# Patient Record
Sex: Male | Born: 1970
Health system: Southern US, Community
[De-identification: ages and names within clinical notes are randomized; demographics above are authoritative.]

## PROBLEM LIST (undated history)

## (undated) DIAGNOSIS — I890 Lymphedema, not elsewhere classified: Secondary | ICD-10-CM

## (undated) DIAGNOSIS — L03116 Cellulitis of left lower limb: Secondary | ICD-10-CM

## (undated) DIAGNOSIS — I10 Essential (primary) hypertension: Secondary | ICD-10-CM

## (undated) DIAGNOSIS — M1990 Unspecified osteoarthritis, unspecified site: Secondary | ICD-10-CM

## (undated) DIAGNOSIS — G4730 Sleep apnea, unspecified: Secondary | ICD-10-CM

## (undated) DIAGNOSIS — Z993 Dependence on wheelchair: Secondary | ICD-10-CM

## (undated) DIAGNOSIS — G9589 Other specified diseases of spinal cord: Secondary | ICD-10-CM

## (undated) DIAGNOSIS — R7303 Prediabetes: Secondary | ICD-10-CM

## (undated) DIAGNOSIS — L03119 Cellulitis of unspecified part of limb: Secondary | ICD-10-CM

## (undated) DIAGNOSIS — R600 Localized edema: Secondary | ICD-10-CM

## (undated) DIAGNOSIS — IMO0002 Reserved for concepts with insufficient information to code with codable children: Secondary | ICD-10-CM

## (undated) DIAGNOSIS — K219 Gastro-esophageal reflux disease without esophagitis: Secondary | ICD-10-CM

## (undated) DIAGNOSIS — J45909 Unspecified asthma, uncomplicated: Secondary | ICD-10-CM

## (undated) DIAGNOSIS — L02416 Cutaneous abscess of left lower limb: Secondary | ICD-10-CM

## (undated) DIAGNOSIS — I509 Heart failure, unspecified: Secondary | ICD-10-CM

## (undated) DIAGNOSIS — T7840XA Allergy, unspecified, initial encounter: Secondary | ICD-10-CM

## (undated) DIAGNOSIS — E785 Hyperlipidemia, unspecified: Secondary | ICD-10-CM

## (undated) HISTORY — PX: BACK SURGERY: SHX140

## (undated) HISTORY — DX: Localized edema: R60.0

## (undated) HISTORY — DX: Sleep apnea, unspecified: G47.30

## (undated) HISTORY — DX: Allergy, unspecified, initial encounter: T78.40XA

## (undated) HISTORY — DX: Hyperlipidemia, unspecified: E78.5

## (undated) HISTORY — DX: Unspecified asthma, uncomplicated: J45.909

## (undated) HISTORY — PX: JOINT REPLACEMENT: SHX530

## (undated) HISTORY — DX: Prediabetes: R73.03

## (undated) HISTORY — DX: Reserved for concepts with insufficient information to code with codable children: IMO0002

## (undated) HISTORY — DX: Unspecified osteoarthritis, unspecified site: M19.90

---

## 1991-01-07 DIAGNOSIS — IMO0002 Reserved for concepts with insufficient information to code with codable children: Secondary | ICD-10-CM

## 1991-01-07 HISTORY — PX: SPINAL FUSION: SHX223

## 1991-01-07 HISTORY — DX: Reserved for concepts with insufficient information to code with codable children: IMO0002

## 2000-12-28 ENCOUNTER — Ambulatory Visit (HOSPITAL_COMMUNITY): Admission: RE | Admit: 2000-12-28 | Discharge: 2000-12-28 | Payer: Self-pay | Admitting: Preventative Medicine

## 2000-12-28 ENCOUNTER — Encounter: Payer: Self-pay | Admitting: Preventative Medicine

## 2001-02-17 ENCOUNTER — Emergency Department (HOSPITAL_COMMUNITY): Admission: EM | Admit: 2001-02-17 | Discharge: 2001-02-17 | Payer: Self-pay | Admitting: *Deleted

## 2001-02-17 ENCOUNTER — Encounter: Payer: Self-pay | Admitting: *Deleted

## 2002-04-22 ENCOUNTER — Encounter: Payer: Self-pay | Admitting: *Deleted

## 2002-04-22 ENCOUNTER — Emergency Department (HOSPITAL_COMMUNITY): Admission: EM | Admit: 2002-04-22 | Discharge: 2002-04-22 | Payer: Self-pay | Admitting: *Deleted

## 2003-07-18 ENCOUNTER — Emergency Department (HOSPITAL_COMMUNITY): Admission: EM | Admit: 2003-07-18 | Discharge: 2003-07-18 | Payer: Self-pay | Admitting: Emergency Medicine

## 2003-07-25 ENCOUNTER — Emergency Department (HOSPITAL_COMMUNITY): Admission: EM | Admit: 2003-07-25 | Discharge: 2003-07-25 | Payer: Self-pay | Admitting: Emergency Medicine

## 2003-08-22 ENCOUNTER — Encounter (HOSPITAL_COMMUNITY): Admission: RE | Admit: 2003-08-22 | Discharge: 2003-09-21 | Payer: Self-pay | Admitting: Orthopaedic Surgery

## 2004-12-23 ENCOUNTER — Emergency Department (HOSPITAL_COMMUNITY): Admission: EM | Admit: 2004-12-23 | Discharge: 2004-12-24 | Payer: Self-pay | Admitting: Emergency Medicine

## 2004-12-24 ENCOUNTER — Ambulatory Visit (HOSPITAL_COMMUNITY): Admission: RE | Admit: 2004-12-24 | Discharge: 2004-12-24 | Payer: Self-pay | Admitting: Emergency Medicine

## 2005-12-05 ENCOUNTER — Encounter: Payer: Self-pay | Admitting: Family Medicine

## 2006-05-19 ENCOUNTER — Emergency Department (HOSPITAL_COMMUNITY): Admission: EM | Admit: 2006-05-19 | Discharge: 2006-05-19 | Payer: Self-pay | Admitting: Emergency Medicine

## 2006-07-29 ENCOUNTER — Encounter: Admission: RE | Admit: 2006-07-29 | Discharge: 2006-09-07 | Payer: Self-pay | Admitting: Family Medicine

## 2006-09-08 ENCOUNTER — Encounter: Admission: RE | Admit: 2006-09-08 | Discharge: 2006-10-14 | Payer: Self-pay | Admitting: Family Medicine

## 2006-10-08 ENCOUNTER — Encounter: Admission: RE | Admit: 2006-10-08 | Discharge: 2006-10-09 | Payer: Self-pay | Admitting: *Deleted

## 2006-10-22 ENCOUNTER — Inpatient Hospital Stay (HOSPITAL_COMMUNITY): Admission: EM | Admit: 2006-10-22 | Discharge: 2006-10-24 | Payer: Self-pay | Admitting: Emergency Medicine

## 2007-01-02 ENCOUNTER — Inpatient Hospital Stay (HOSPITAL_COMMUNITY): Admission: EM | Admit: 2007-01-02 | Discharge: 2007-01-06 | Payer: Self-pay | Admitting: Emergency Medicine

## 2008-01-07 DIAGNOSIS — R600 Localized edema: Secondary | ICD-10-CM

## 2008-01-07 HISTORY — DX: Localized edema: R60.0

## 2009-12-30 ENCOUNTER — Emergency Department (HOSPITAL_COMMUNITY)
Admission: EM | Admit: 2009-12-30 | Discharge: 2009-12-31 | Payer: Self-pay | Source: Home / Self Care | Admitting: Emergency Medicine

## 2010-03-18 LAB — URINALYSIS, ROUTINE W REFLEX MICROSCOPIC
Protein, ur: 100 mg/dL — AB
Urobilinogen, UA: 0.2 mg/dL (ref 0.0–1.0)

## 2010-03-18 LAB — URINE MICROSCOPIC-ADD ON

## 2010-03-18 LAB — URINE CULTURE

## 2010-05-21 NOTE — Group Therapy Note (Signed)
NAMEJAMESROBERT, Jordan Jensen                 ACCOUNT NO.:  [id number]   MEDICAL RECORD NO.:  [id number]          PATIENT TYPE:  [facility]   LOCATION:  A318                          FACILITY:  [facility]   PHYSICIAN:  Skeet Latch, DO    DATE OF BIRTH:  [birth year]   DATE OF PROCEDURE:  01/05/2007  DATE OF DISCHARGE:                                 PROGRESS NOTE   SUBJECTIVE:  Jordan Jensen is a 40 year old African-American male with a  history of lower left leg lymphedema, which he went to [facility] to  their lymphedema clinic.  The patient came in with increased pain,  swelling, and warmness of left lower leg.  After, he states he went on a  long drive to pick up his niece from the school.  The patient denied any  new trauma to the region.  He states for at least 3 to 4 days prior, his  leg was very painful and warm and had increase in swelling.  The patient  admits that he was treating a wound on his left foot that he believes  may have got infected and started this process with his left lower leg.  The patient was admitted and started on IV antibiotics.  He seems to be  improving, but his leg is still warm to the touch and swollen.  The  patient was also found to have a urinary tract infection upon being  admitted to the hospital.  The patient was seen with his same complaint  and he was in the hospital a couple months ago and was treated with  antibiotics for probably 4 to 5 days and discharged.  Today, he states  his pain feels better and he is doing well.   OBJECTIVE:  VITAL SIGNS:  Temperature is 98.8, respirations 20, heart  rate 75, blood pressure 114/72.  CARDIOVASCULAR:  Regular rate and rhythm. No murmurs, rubs, or gallops.  LUNGS:  Clear to auscultation bilaterally.  No rales, rhonchi, or  wheezing.  ABDOMEN:  Obese, soft, nontender, nondistended.  No rigidity or  guarding.  Positive bowel sounds.  EXTREMITIES:  Right lower extremity with slight edema.  No redness or  major swelling is  noted.  The left lower extremity has significant  lymphedema.  It is warm to the touch.  It seems to be improved from  yesterday.  Does have induration, but seems to be improving slowly.   LABS:  Hemoglobin 1.8, hematocrit 34.9, white count 7.6, platelets 167,  sodium 140, potassium 3.9, chloride 106, GOT is 29, BUN 4, creatinine  0.97.  His glucose is 95.   ASSESSMENT AND PLAN:  1. Left leg cellulitis with lymphedema.  We will continue with      intravenous antibiotics at this time and continue to watch his      blood cultures, keep his leg elevated.  We will add diuretic to his      regimen at this time.  2. Urinary tract infection.  Continue with IV antibiotics.  3. Chronic lymphedema.  The patient may need referral to maybe a local  lymphedema clinic maybe at [facility] upon being discharged.      Skeet Latch, DO  Electronically Signed    SM/MEDQ  D:  01/05/2007  T:  01/06/2007  Job:  [id number]
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
## 2010-05-21 NOTE — Discharge Summary (Signed)
[name], Jordan Jensen                 ACCOUNT NO.:  [id number]   MEDICAL RECORD NO.:  [id number]          PATIENT TYPE:  [facility]   LOCATION:  A331                          FACILITY:  [facility]   PHYSICIAN:  Dorris Singh, DO    DATE OF BIRTH:  11/15/1970   DATE OF ADMISSION:  10/21/2006  DATE OF DISCHARGE:  10/18/2008LH                               DISCHARGE SUMMARY   ADMISSION DIAGNOSES:  1. Cellulitis.  2. Chronic lymphedema of left lower extremity.  3. Leukocytosis.   DISCHARGE DIAGNOSES:  1. Left lower leg cellulitis.  2. Chronic lymphedema bilaterally.  3. Thrombocytopenia.   PRIMARY CARE PHYSICIAN:  [facility].   HISTORY AND PHYSICAL:  The patient is a 40 year old African-American  male who presents with back pain and increasing swelling of left leg.  He has a history of a C6-7 cord injury accident and walks with crutches.  He has been treated for lymphedema for the past 6 months for his left  extremity, and states that it began to get very swollen and edematous  and painful, and he has been treated for lymphedema of that very same  leg for approximately 20 outpatient treatments, but states it is bigger,  and his leg has been warm to the touch.  He was then admitted to the  service of [facility], and he was placed on IV antibiotics, and blood and  urine cultures were obtained.  He was also placed on unison empirically,  and a wound care nurse was consulted. His lymphedema was chronic in  nature and is probably contributing to his cellulitis.  A venous Doppler  was obtained which did not find any thrombophlebitis or a deep vein  thrombosis as well.  The patient continued to do well with a decrease in  swelling on October 17th.  It was determined that if the patient  continued to progress well, that he could be discharged after one  treatment on October 18th, and then could be switched to p.o.  medications.  He continued to do well, had no complaints, was  afebrile.  At that point in time, on October 18th, it was determined he could be  discharged to home.  The patient's condition is stable.   DISPOSITION:  To home.   He is to follow up with [facility] Practitioners in 3-5 days to have  them examine his leg to make sure that the swelling continues to improve  and function begins to improve.  The patient stated understanding of  this.  He will be sent home on Levaquin 500 mg 1 p.o. q. day x10 days.      Dorris Singh, DO  Electronically Signed     CB/MEDQ  D:  10/24/2006  T:  10/26/2006  Job:  [id number]

## 2010-05-21 NOTE — Discharge Summary (Signed)
Jordan Jensen, KE                 ACCOUNT NO.:  [id number]   MEDICAL RECORD NO.:  [id number]          PATIENT TYPE:  [facility]   LOCATION:  A318                          FACILITY:  [facility]   PHYSICIAN:  Osvaldo Shipper, MD     DATE OF BIRTH:  10/16/1970   DATE OF ADMISSION:  01/02/2007  DATE OF DISCHARGE:  12/31/2008LH                               DISCHARGE SUMMARY   PRIMARY MEDICAL DOCTOR:  Dr. Jorene Guest at [facility].   DISCHARGE DIAGNOSES:  1. Left lower extremity cellulitis.  2. Chronic lower extremity lymphedema, probably predisposing to #1.  3. Urinary tract infection, stable.   Please see the H and P, dictated by Dr. Lilian Kapur for details regarding  the patient's presenting illness.   BRIEF HOSPITAL COURSE:  Patient is a 40 year old African-American male,  who is obese, who has chronic lymphedema of the lower extremities, who  presented with complaints of leg pain.  The patient was found to have  cellulitis.  Patient has been previously admitted for similar  complaints.  He has undergone two Doppler studies, both of which have  been negative for DVT.  Basically, the patient has chronic lymphedema of  the lower extremities, which predisposes him to cellulitis.  His white  count was 14,000 when he came in.  Patient was put on Unasyn and he has  significantly improved.  He was also put on low-dose Lasix, which seems  to have helped the swelling and his erythema.  Today, patient is feeling  well.  His erythema is still present.  He still has some warmth, but  according to the patient, both of these features have improved in his  left leg.  He has been afebrile, his vital signs have been stable.  His  white count is normal, so I think he can be considered stable for  discharge.   Dr. Lilian Kapur did feel that patient may be a candidate for referral to a  tertiary care center, such as [facility] or [facility], for evaluation and  treatment of his chronic lymphedema.  I think this  may be a good idea  and his PMD should probably try to refer him to one of these centers.   There was also doubt regarding UTI, but cultures did not show any  significant organisms.   DISCHARGE MEDICATIONS:  1. Augmentin 875/125 one tab b.i.d. for two weeks.  2. Lasix 20 mg daily for 7 days.  3. Potassium chloride 10 mEq once daily for 7 days.   FOLLOWUP:  With [facility] in two weeks.   DIET:  As before.   PHYSICAL ACTIVITY:  As before.   Total time of discharge:  35 minutes.      Osvaldo Shipper, MD  Electronically Signed     GK/MEDQ  D:  01/06/2007  T:  01/06/2007  Job:  [id number]

## 2010-05-21 NOTE — Group Therapy Note (Signed)
Jordan Jensen, Jordan Jensen                 ACCOUNT NO.:  [id number]   MEDICAL RECORD NO.:  [id number]          PATIENT TYPE:  [facility]   LOCATION:  A331                          FACILITY:  [facility]   PHYSICIAN:  Skeet Latch, DO    DATE OF BIRTH:  12-07-1970   DATE OF PROCEDURE:  10/23/2006  DATE OF DISCHARGE:                                 PROGRESS NOTE   SUBJECTIVE:  Jordan Jensen seems to slowly be improving.  He states his left  lower extremity has decreased pain, swelling, and tenderness today.  The  patient was seen by the wound care nurse and was given instructions  regarding care of his left lower extremity .  The patient seems to be  doing well at this time.   OBJECTIVE:  VITAL SIGNS:  Today, temperature is 100.6, pulse 83,  respirations 20, blood pressure is 111/56.  CARDIOVASCULAR:  Regular rate and rhythm, no rubs, gallops, or murmurs.  LUNGS:  Clear to auscultation bilaterally.  No rales, rhonchi, or  wheezing.  ABDOMEN:  Obese, soft, nontender, nondistended.  No rigidity or  guarding.  Positive bowel sounds.  EXTREMITIES:  Left lower extremity fluctuance is still present, still  has warmth to his leg.  The swelling seems to be slightly decreased as  also the erythema seems to be decreased at this time.  No obvious open  wounds are noted on his extremity.  The patient does have a scab on the  upper foot of his left leg and there continue to be chronic change to  his left lower extremity.   LABORATORY:  Today, lipid panel with cholesterol 149 and triglycerides  45, ACS 32, LDL 108.  Blood cultures, so far, are negative.  Sodium 139,  potassium 3.9, chloride 105, CO2 is 29, glucose 107, BUN 7, creatinine  1.32.  White count 10.6, hemoglobin 13, hematocrit 38.2, platelets 149.   ASSESSMENT/PLAN:  1. __________ cellulitis.  The patient continues to be on intravenous      antibiotics and so far his blood cultures are negative.  I will      continue the patient on intravenous Unasyn at  this time.  And we      will continue wound care instructions to his left lower extremity.  2. Peripheral lymph edema seems to be chronic, seems to be fluctuating      in nature and at this time very difficult to assess secondary to      the cellulitis of his leg.  His venous dopplers were negative for      deep vein thrombosis at this time.  The patient will continue deep      vein thrombosis and gastrointestinal prophylaxis.  3. Leukocytosis, seemingly resolved.  We will continue to monitor his      white count.  4. Pyrexia.  We are continuing intravenous antibiotics as well as      Tylenol for any recurring fevers.      Skeet Latch, DO  Electronically Signed     SM/MEDQ  D:  10/23/2006  T:  10/23/2006  Job:  [id number]
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
## 2010-10-11 LAB — URINE MICROSCOPIC-ADD ON

## 2010-10-11 LAB — BASIC METABOLIC PANEL
CO2: 28
CO2: 29
Calcium: 8.6
Chloride: 105
GFR calc Af Amer: >60
GFR calc non Af Amer: >60
GFR calc non Af Amer: >60
Glucose, Bld: 95
Glucose, Bld: 98
Potassium: 3.9
Sodium: 139
Sodium: 140

## 2010-10-11 LAB — URINE CULTURE

## 2010-10-11 LAB — DIFFERENTIAL
Basophils Absolute: 0
Basophils Absolute: 0
Basophils Absolute: 0
Eosinophils Absolute: 0
Eosinophils Absolute: 0.1
Eosinophils Absolute: 0.2
Eosinophils Relative: 1
Lymphocytes Relative: 29
Lymphocytes Relative: 9 — ABNORMAL LOW
Lymphs Abs: 2.2
Lymphs Abs: 2.5
Monocytes Absolute: 0.5
Monocytes Absolute: 0.7
Monocytes Absolute: 0.8
Monocytes Relative: 10
Monocytes Relative: 7
Neutro Abs: 12.6 — ABNORMAL HIGH
Neutro Abs: 8.1 — ABNORMAL HIGH
Neutrophils Relative %: 55
Neutrophils Relative %: 79 — ABNORMAL HIGH

## 2010-10-11 LAB — CBC
HCT: 34.1 — ABNORMAL LOW
HCT: 34.2 — ABNORMAL LOW
HCT: 34.9 — ABNORMAL LOW
HCT: 39.2
Hemoglobin: 11.8 — ABNORMAL LOW
Hemoglobin: 11.9 — ABNORMAL LOW
Hemoglobin: 13.1
MCHC: 33.8
MCHC: 33.9
MCV: 85.8
Platelets: 128 — ABNORMAL LOW
RBC: 3.99 — ABNORMAL LOW
RBC: 4 — ABNORMAL LOW
RBC: 4.09 — ABNORMAL LOW
RDW: 14.3
WBC: 10.3
WBC: 7.6
WBC: 8

## 2010-10-11 LAB — COMPREHENSIVE METABOLIC PANEL
Albumin: 3.5
Alkaline Phosphatase: 39
BUN: 9
CO2: 25
Calcium: 9.4
Glucose, Bld: 91
Sodium: 136
Total Bilirubin: 1.4 — ABNORMAL HIGH
Total Protein: 7.9

## 2010-10-11 LAB — CULTURE, BLOOD (ROUTINE X 2)

## 2010-10-11 LAB — URINALYSIS, ROUTINE W REFLEX MICROSCOPIC
Leukocytes, UA: NEGATIVE
Nitrite: NEGATIVE
Protein, ur: 100 — AB
pH: 6

## 2010-10-16 LAB — BASIC METABOLIC PANEL
Calcium: 8.8
Chloride: 106
Creatinine, Ser: 1.28
GFR calc Af Amer: >60
GFR calc non Af Amer: >60
Sodium: 136
Sodium: 139

## 2010-10-16 LAB — CULTURE, BLOOD (ROUTINE X 2)
Culture: NO GROWTH
Report Status: 10202008

## 2010-10-16 LAB — CBC
HCT: 36.6 — ABNORMAL LOW
HCT: 38 — ABNORMAL LOW
HCT: 39.8
Hemoglobin: 13
MCHC: 34
MCV: 84
Platelets: 136 — ABNORMAL LOW
Platelets: 158
RBC: 4.55
RDW: 13.6
RDW: 13.7
RDW: 14
WBC: 8.1

## 2010-10-16 LAB — LIPID PANEL
Cholesterol: 149
LDL Cholesterol: 108 — ABNORMAL HIGH
Triglycerides: 45
VLDL: 9

## 2010-10-16 LAB — URINALYSIS, ROUTINE W REFLEX MICROSCOPIC
Bilirubin Urine: NEGATIVE
Protein, ur: NEGATIVE
Urobilinogen, UA: 1

## 2010-10-16 LAB — DIFFERENTIAL
Basophils Absolute: 0
Basophils Absolute: 0
Basophils Absolute: 0
Basophils Relative: 0
Basophils Relative: 0
Eosinophils Absolute: 0
Eosinophils Absolute: 0.2
Eosinophils Relative: 3
Lymphocytes Relative: 33
Lymphocytes Relative: 36
Lymphocytes Relative: 9 — ABNORMAL LOW
Lymphs Abs: 2.9
Monocytes Absolute: 0.4
Monocytes Absolute: 0.5
Monocytes Relative: 2 — ABNORMAL LOW
Monocytes Relative: 4
Neutro Abs: 3.9
Neutro Abs: 9.2 — ABNORMAL HIGH
Neutrophils Relative %: 48
Neutrophils Relative %: 91 — ABNORMAL HIGH

## 2011-05-14 DIAGNOSIS — M545 Low back pain, unspecified: Secondary | ICD-10-CM | POA: Diagnosis not present

## 2011-05-14 DIAGNOSIS — E669 Obesity, unspecified: Secondary | ICD-10-CM | POA: Diagnosis not present

## 2011-05-14 DIAGNOSIS — E782 Mixed hyperlipidemia: Secondary | ICD-10-CM | POA: Diagnosis not present

## 2011-05-20 DIAGNOSIS — M545 Low back pain, unspecified: Secondary | ICD-10-CM | POA: Diagnosis not present

## 2011-05-20 DIAGNOSIS — I890 Lymphedema, not elsewhere classified: Secondary | ICD-10-CM | POA: Diagnosis not present

## 2011-05-20 DIAGNOSIS — E782 Mixed hyperlipidemia: Secondary | ICD-10-CM | POA: Diagnosis not present

## 2011-05-20 DIAGNOSIS — R350 Frequency of micturition: Secondary | ICD-10-CM | POA: Diagnosis not present

## 2011-05-20 DIAGNOSIS — L03119 Cellulitis of unspecified part of limb: Secondary | ICD-10-CM | POA: Diagnosis not present

## 2011-05-20 DIAGNOSIS — L02419 Cutaneous abscess of limb, unspecified: Secondary | ICD-10-CM | POA: Diagnosis not present

## 2011-05-23 DIAGNOSIS — R300 Dysuria: Secondary | ICD-10-CM | POA: Diagnosis not present

## 2011-05-23 DIAGNOSIS — E785 Hyperlipidemia, unspecified: Secondary | ICD-10-CM | POA: Diagnosis not present

## 2011-07-04 ENCOUNTER — Ambulatory Visit (INDEPENDENT_AMBULATORY_CARE_PROVIDER_SITE_OTHER): Payer: Medicare Other | Admitting: Family Medicine

## 2011-07-04 ENCOUNTER — Encounter: Payer: Self-pay | Admitting: Family Medicine

## 2011-07-04 VITALS — BP 149/77 | HR 82 | Temp 99.2°F | Ht 70.0 in | Wt >= 6400 oz

## 2011-07-04 DIAGNOSIS — R0609 Other forms of dyspnea: Secondary | ICD-10-CM | POA: Diagnosis not present

## 2011-07-04 DIAGNOSIS — E669 Obesity, unspecified: Secondary | ICD-10-CM

## 2011-07-04 DIAGNOSIS — R0683 Snoring: Secondary | ICD-10-CM

## 2011-07-04 DIAGNOSIS — R0989 Other specified symptoms and signs involving the circulatory and respiratory systems: Secondary | ICD-10-CM | POA: Diagnosis not present

## 2011-07-04 DIAGNOSIS — G9589 Other specified diseases of spinal cord: Secondary | ICD-10-CM | POA: Insufficient documentation

## 2011-07-04 DIAGNOSIS — R300 Dysuria: Secondary | ICD-10-CM | POA: Diagnosis not present

## 2011-07-04 DIAGNOSIS — K3189 Other diseases of stomach and duodenum: Secondary | ICD-10-CM

## 2011-07-04 DIAGNOSIS — R600 Localized edema: Secondary | ICD-10-CM

## 2011-07-04 DIAGNOSIS — R609 Edema, unspecified: Secondary | ICD-10-CM

## 2011-07-04 DIAGNOSIS — R1013 Epigastric pain: Secondary | ICD-10-CM

## 2011-07-04 DIAGNOSIS — IMO0002 Reserved for concepts with insufficient information to code with codable children: Secondary | ICD-10-CM

## 2011-07-04 LAB — CBC
MCH: 29.5 pg (ref 26.0–34.0)
MCV: 85.4 fL (ref 78.0–100.0)
Platelets: 187 10*3/uL (ref 150–400)
RBC: 4.58 MIL/uL (ref 4.22–5.81)
RDW: 14.2 % (ref 11.5–15.5)
WBC: 9.3 10*3/uL (ref 4.0–10.5)

## 2011-07-04 LAB — COMPREHENSIVE METABOLIC PANEL
ALT: 41 U/L (ref 0–53)
Albumin: 4 g/dL (ref 3.5–5.2)
CO2: 26 mEq/L (ref 19–32)
Glucose, Bld: 90 mg/dL (ref 70–99)
Potassium: 4.1 mEq/L (ref 3.5–5.3)
Sodium: 140 mEq/L (ref 135–145)
Total Protein: 7.2 g/dL (ref 6.0–8.3)

## 2011-07-05 ENCOUNTER — Encounter: Payer: Self-pay | Admitting: Family Medicine

## 2011-07-05 DIAGNOSIS — R1013 Epigastric pain: Secondary | ICD-10-CM | POA: Insufficient documentation

## 2011-07-05 DIAGNOSIS — R0683 Snoring: Secondary | ICD-10-CM | POA: Insufficient documentation

## 2011-07-05 DIAGNOSIS — R300 Dysuria: Secondary | ICD-10-CM | POA: Insufficient documentation

## 2011-07-05 DIAGNOSIS — R600 Localized edema: Secondary | ICD-10-CM | POA: Insufficient documentation

## 2011-07-05 MED ORDER — ESOMEPRAZOLE MAGNESIUM 20 MG PO PACK: 20.0000 mg | PACK | Freq: Every day | ORAL | Status: DC

## 2011-07-05 NOTE — Assessment & Plan Note (Signed)
Potential UTI. UA today

## 2011-07-05 NOTE — Assessment & Plan Note (Signed)
Pt amenable to making dietary changes. Discussed w/ Rosalita Chessman [facility]) who will meet w/ pt prior to next appt.
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
## 2011-07-05 NOTE — Assessment & Plan Note (Signed)
>>  ASSESSMENT AND PLAN FOR BILATERAL LEG EDEMA WRITTEN ON 07/05/2011  3:16 PM BY MERRELL, DAVID J, MD  Significant LE edema w/ likely mixed picture of lymphedema, venous stasis/insufficiency. No evidence of DVT. Will obtain labs to ensure no hepatic or renal involvement. Pt to continue using compression stockings, elevation, and compression vacuum device. Will likely refer to lymphedema clinic at future appt. (Will research options)

## 2011-07-05 NOTE — Assessment & Plan Note (Signed)
Brought up at end of appt. Will address at next appt. Concern for OSA given body habitus and BP.
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
## 2011-07-05 NOTE — Patient Instructions (Addendum)
Thank you for coming into clinic today. It was a pleasure meeting you. You're doing very well. Please make an appointment with Pamelia Hoit for nutritional education. Please come back  and see me in 2-4 weeks to discuss your snoring and sleep habits. Followup with you regarding your lab work at your next appointment or sooner if needed Please call the clinic or come back sooner if needed.

## 2011-07-05 NOTE — Assessment & Plan Note (Signed)
Significant LE edema w/ likely mixed picture of lymphedema, venous stasis/insufficiency. No evidence of DVT. Will obtain labs to ensure no hepatic or renal involvement. Pt to continue using compression stockings, elevation, and compression vacuum device. Will likely refer to lymphedema clinic at future appt. (Will research options)

## 2011-07-05 NOTE — Assessment & Plan Note (Signed)
Pt to likely benefit from 14 day course of PPI. No signs of esophageal malignancy or stricture. Discussed eating habits, avoiding sleeping w/in 2hrs of meals. Red flags discussed.

## 2011-07-15 ENCOUNTER — Ambulatory Visit (INDEPENDENT_AMBULATORY_CARE_PROVIDER_SITE_OTHER): Payer: Medicare Other | Admitting: Family Medicine

## 2011-07-15 ENCOUNTER — Encounter: Payer: Self-pay | Admitting: Family Medicine

## 2011-07-15 VITALS — BP 159/90 | HR 94 | Temp 99.3°F

## 2011-07-15 DIAGNOSIS — K3189 Other diseases of stomach and duodenum: Secondary | ICD-10-CM

## 2011-07-15 DIAGNOSIS — R300 Dysuria: Secondary | ICD-10-CM

## 2011-07-15 DIAGNOSIS — E669 Obesity, unspecified: Secondary | ICD-10-CM

## 2011-07-15 DIAGNOSIS — R1013 Epigastric pain: Secondary | ICD-10-CM

## 2011-07-15 DIAGNOSIS — IMO0002 Reserved for concepts with insufficient information to code with codable children: Secondary | ICD-10-CM

## 2011-07-15 DIAGNOSIS — R600 Localized edema: Secondary | ICD-10-CM

## 2011-07-15 DIAGNOSIS — R609 Edema, unspecified: Secondary | ICD-10-CM | POA: Diagnosis not present

## 2011-07-15 DIAGNOSIS — R0989 Other specified symptoms and signs involving the circulatory and respiratory systems: Secondary | ICD-10-CM | POA: Diagnosis not present

## 2011-07-15 DIAGNOSIS — R0683 Snoring: Secondary | ICD-10-CM

## 2011-07-15 DIAGNOSIS — I10 Essential (primary) hypertension: Secondary | ICD-10-CM | POA: Diagnosis not present

## 2011-07-15 DIAGNOSIS — R0609 Other forms of dyspnea: Secondary | ICD-10-CM

## 2011-07-15 LAB — POCT URINALYSIS DIPSTICK
Bilirubin, UA: NEGATIVE
Blood, UA: NEGATIVE
Glucose, UA: NEGATIVE
Leukocytes, UA: NEGATIVE
Nitrite, UA: NEGATIVE
Urobilinogen, UA: 1
pH, UA: 7

## 2011-07-15 MED ORDER — FUROSEMIDE 20 MG PO TABS: 20.0000 mg | ORAL_TABLET | Freq: Every day | ORAL | Status: DC

## 2011-07-15 NOTE — Patient Instructions (Addendum)
Thank you for coming in today Please come back for your meeting with Rosalita Chessman and have your blood drawn for cholesterol testing. Make sure you have not eaten before having your blood drawn.  Please come back to see me in 1 month The sleep lab and physical therapy will call you to set up an apopintment. If you haven't heard from them please call the office.  Please call with any questions.

## 2011-07-16 ENCOUNTER — Ambulatory Visit (INDEPENDENT_AMBULATORY_CARE_PROVIDER_SITE_OTHER): Payer: Medicare Other | Admitting: Home Health Services

## 2011-07-16 DIAGNOSIS — E669 Obesity, unspecified: Secondary | ICD-10-CM | POA: Diagnosis not present

## 2011-07-16 NOTE — Progress Notes (Signed)
  Subjective:    Patient ID: Jordan Jensen, male    DOB: 03-23-1970, 41 y.o.   MRN: [id number]  HPI OBESITY Current weight/BMI : 58.11   How long have been obese:  10 + years Course:  Some what worsening Problems or symptoms it causes:  HTN   Things have tried to improve:  Changes in diet, increase physical activity  Patient Identified Concern:  Weight loss, swelling in legs Stage of Change Patient Is In:  Contemplation- pt willing to make changes in next 6 months.  Patient Reported Barriers:  Habits of not moving/exercising, over eating, fast food Patient Reported Perceived Benefits:  Living longer, healthier.  Living to see daughters graduate, future grandchildren Patient Reports Self-Efficacy:   Pt display some self-efficacy, based on past successes, supportive people around him having success Behavior Change Supports:  Mother, physicians Goals:  To join [employer], buy weights for home.  Eat breakfast at home for next week.  Avoid fast food. Patient Education:  We talked about strategies for success to increase physical activity/ dietary changes     Review of Systems     Objective:   Physical Exam  N/A      Assessment & Plan:

## 2011-07-16 NOTE — Patient Instructions (Addendum)
1. Join [employer] 2. Buy weights for home. 3. Eat a healthy breakfast at home every day. 4. Avoid fast food.   Keep record with chart I gave you.  Weekly phone calls with Arlys John [phone].

## 2011-07-17 ENCOUNTER — Telehealth: Payer: Self-pay | Admitting: Family Medicine

## 2011-07-17 LAB — LIPID PANEL
HDL: 33 mg/dL — ABNORMAL LOW (ref >39)
LDL Cholesterol: 111 mg/dL — ABNORMAL HIGH (ref 0–99)

## 2011-07-17 NOTE — Telephone Encounter (Signed)
Called and left a message that UA normal. Recommended increasing fluid intake. Call to be seen in clinic if worsens

## 2011-07-18 ENCOUNTER — Encounter: Payer: Self-pay | Admitting: Family Medicine

## 2011-07-18 NOTE — Assessment & Plan Note (Signed)
>>  ASSESSMENT AND PLAN FOR BILATERAL LEG EDEMA WRITTEN ON 07/18/2011  2:18 AM BY MERRELL, Elmon Else, MD  Referral to lymphedema clinic here [facility]. Referral to PT. Will start Lasix 40 mg by mouth.

## 2011-07-18 NOTE — Assessment & Plan Note (Signed)
Referral to lymphedema clinic here Jordan Jensen. Referral to PT. Will start Lasix 40 mg by mouth.

## 2011-07-18 NOTE — Assessment & Plan Note (Signed)
Concern for OSA. Referral to sleep study lab for overnight sleep study and CPAP titration. This will hopefully improve patient's energy level and reduce blood pressure.

## 2011-07-18 NOTE — Assessment & Plan Note (Signed)
Starting Lasix 40 mg by mouth as patient likely receive hypertensive as well as partially edema benefit.

## 2011-07-18 NOTE — Assessment & Plan Note (Signed)
Significantly improved after trial of PPI. Will trial patient without for now. Will address in the future if continues to be a problem.

## 2011-07-18 NOTE — Assessment & Plan Note (Signed)
Concern for UTI as patient has had these in the past. UA today. Patient advised to increase water intake.

## 2011-07-18 NOTE — Progress Notes (Signed)
  Subjective:    Patient ID: Jordan Jensen, male    DOB: 1970/02/04, 41 y.o.   MRN: [id number]  HPI Chief complaint snoring, reflux, dysuria  Snoring: Patient reports several year year history of snoring which typically takes place when patient is overly tired and has stayed up late. This is reported by family members. Improves with better rest. Denies headache upon awakening. Does report excessive sleepiness during the day. No reported apneic episodes. Denies chest pain, shortness of breath, DOE, headache  Reflux: Reflux significantly improved since last appointment. PPI was taken daily with great relief. Patient still complains of occasional esophageal discomfort which is relieved by eating non-spicy foods and remaining upright for 2 hours after meals. Denies hemoptysis, weight loss, dark stools, dysphasia.  Dysuria: Symptoms started on Sunday with painful urination and dark urine. Pain with every urination that is relieved when patient stops urinating. First occurrence happened after patient reported holding his "pee for a long time ". Denies significant soda or alcohol intake and drinks primarily for juice and water. No difficulty when starting or stopping stream. Denies hematuria, abdominal pain, foul smell.  Lower extremity edema: Patient concerned that skin overlying edematous legs is tender and sensitive. Burning sensation when aggravated with sitting take fluids as patient noted burning sensation when small amount of urine landed on his leg. Relieved with motions overall edema is not improved from previous clinic visit as reviewed. Patient previously attended come lymphedema clinic and is interested in going again. Patient has never tried Lasix.  Review of Systems Per history of present illness with the following additions: Denies chest pain, shortness of breath, syncope    Objective:   Physical Exam  General: Obese HEENT, oropharynx clear, tonsils are 1+. Uvula present. Trachea midline,  no thyromegaly. No cervical lymphadenopathy. Skin: Dermatitis type changes to lower extremity is right greater than left with shiny smooth-appearing skin.  Extremities: Left lower extremity pitting edema      Assessment & Plan:

## 2011-07-24 ENCOUNTER — Telehealth: Payer: Self-pay | Admitting: Home Health Services

## 2011-07-24 NOTE — Telephone Encounter (Signed)
Pt no longer on Nexium. Previous clinic note indicates pt trialed on nexium for 2 wks only. Will restart as necessary at f/u appts

## 2011-07-24 NOTE — Telephone Encounter (Signed)
Spoke with Manly.  Pt is currently taking lasix daily without missing any days. Pt not currently monitoring bp at home.  Pt reports having not ate fast food in 1 week.  He also reports abstaining from red meat and increasing his daily fruit/vegetable consumption.  Pt reports eating at home and is making healthier choices such as whole grains versus boxed food.  Pt also reports doing some exercises around the house.  Pt reports feeling better already and believes his swelling is going down already.  Pt set goal for this next week to join [employer] and to continue to not eat fast food, eating healthier foods at the house.   Pt's overall goal is weight loss, htn management.

## 2011-07-24 NOTE — Telephone Encounter (Signed)
When patient went to pharmacy there was not a prescription for Nexium there.  Please resend to pharmacy prescription.   Pt is currently only taking lasix.

## 2011-07-24 NOTE — Progress Notes (Signed)
Patient ID: Jordan Jensen, male   DOB: 10/17/1970, 41 y.o.   MRN: [id number] I have reviewed the visit encounter w/ our health coach and agree w/ the assessment and plan  Shelly Flatten, MD Family Medicine PGY-2 07/24/2011, 2:18 PM

## 2011-07-26 DIAGNOSIS — L02219 Cutaneous abscess of trunk, unspecified: Secondary | ICD-10-CM | POA: Diagnosis not present

## 2011-07-26 DIAGNOSIS — R3915 Urgency of urination: Secondary | ICD-10-CM | POA: Diagnosis not present

## 2011-07-26 DIAGNOSIS — L03319 Cellulitis of trunk, unspecified: Secondary | ICD-10-CM | POA: Diagnosis not present

## 2011-07-26 DIAGNOSIS — R350 Frequency of micturition: Secondary | ICD-10-CM | POA: Diagnosis not present

## 2011-07-26 DIAGNOSIS — R1031 Right lower quadrant pain: Secondary | ICD-10-CM | POA: Diagnosis not present

## 2011-07-26 DIAGNOSIS — Z79899 Other long term (current) drug therapy: Secondary | ICD-10-CM | POA: Diagnosis not present

## 2011-07-26 DIAGNOSIS — R109 Unspecified abdominal pain: Secondary | ICD-10-CM | POA: Diagnosis not present

## 2011-07-26 DIAGNOSIS — R509 Fever, unspecified: Secondary | ICD-10-CM | POA: Diagnosis not present

## 2011-07-31 ENCOUNTER — Telehealth: Payer: Self-pay | Admitting: Home Health Services

## 2011-07-31 ENCOUNTER — Telehealth: Payer: Self-pay | Admitting: *Deleted

## 2011-07-31 NOTE — Telephone Encounter (Signed)
Called and informed pt of appt for PT on August 08, 2011 @ 1000 am. Pt given address [address] and phone number [phone] and told to bring in his insurance card, ID and copay. If he cannot keep this appt he is to call their office 24 hours in advance to cancel/reschedule or he may be charged a fee. Pt voiced understanding and agreed.Loralee Pacas Lorenzo

## 2011-07-31 NOTE — Telephone Encounter (Signed)
Informed pt he was only taking Nexium for 2 weeks and he could discuss the need to restart medication at next appointment.

## 2011-07-31 NOTE — Telephone Encounter (Signed)
Spoke with Ganesh.  Pt reports having sever pain last Saturday 7/20 and went to [facility] ER.  Pt reports being told he had a kidney stone and was given medication.   Pt reports taking medications as prescribed and just started feeling better today.  Pt not self-monitoring bp at home.   Pt reported only eating fast food 1x this past week. Other wise has been eating at home.  Pt was not able to get to [facility] this week.  Pt set goal to continue limiting fast food and to follow up with PCP on 7/30.  Will re-set goals at that time.  Pt's overall goal is weight loss, htn management.

## 2011-08-05 ENCOUNTER — Ambulatory Visit (INDEPENDENT_AMBULATORY_CARE_PROVIDER_SITE_OTHER): Payer: Medicare Other | Admitting: Family Medicine

## 2011-08-05 ENCOUNTER — Encounter: Payer: Self-pay | Admitting: Family Medicine

## 2011-08-05 VITALS — BP 117/72 | HR 50 | Temp 99.3°F

## 2011-08-05 DIAGNOSIS — L0390 Cellulitis, unspecified: Secondary | ICD-10-CM | POA: Diagnosis not present

## 2011-08-05 DIAGNOSIS — R0609 Other forms of dyspnea: Secondary | ICD-10-CM

## 2011-08-05 DIAGNOSIS — I10 Essential (primary) hypertension: Secondary | ICD-10-CM

## 2011-08-05 DIAGNOSIS — K3189 Other diseases of stomach and duodenum: Secondary | ICD-10-CM

## 2011-08-05 DIAGNOSIS — R1013 Epigastric pain: Secondary | ICD-10-CM

## 2011-08-05 DIAGNOSIS — R609 Edema, unspecified: Secondary | ICD-10-CM

## 2011-08-05 DIAGNOSIS — R0989 Other specified symptoms and signs involving the circulatory and respiratory systems: Secondary | ICD-10-CM

## 2011-08-05 DIAGNOSIS — L0291 Cutaneous abscess, unspecified: Secondary | ICD-10-CM | POA: Insufficient documentation

## 2011-08-05 DIAGNOSIS — R0683 Snoring: Secondary | ICD-10-CM

## 2011-08-05 DIAGNOSIS — R300 Dysuria: Secondary | ICD-10-CM | POA: Diagnosis not present

## 2011-08-05 DIAGNOSIS — R600 Localized edema: Secondary | ICD-10-CM

## 2011-08-05 MED ORDER — CEFTRIAXONE SODIUM 1 G IJ SOLR
1.0000 g | Freq: Once | INTRAMUSCULAR | Status: AC
  Administered 2011-08-05: 1 g via INTRAMUSCULAR

## 2011-08-05 NOTE — Patient Instructions (Addendum)
Thank you for coming in today You have a serious infection of your chest and skin.  Please come back tomorrow and on THursday for evaluation Please call the clinic if you feel you are getting worse and can't wait to be seen in clinic Please continue taking your antibiotics as prescribed    Abscess An abscess (boil or furuncle) is an infected area under your skin. This area is filled with yellowish white fluid (pus). HOME CARE   Only take medicine as told by your doctor.   Keep the skin clean around your abscess. Keep clothes that may touch the abscess clean.   Change any bandages (dressings) as told by your doctor.   Avoid direct skin contact with other people. The infection can spread by skin contact with others.   Practice good hygiene and do not share personal care items.   Do not share athletic equipment, towels, or whirlpools. Shower after every practice or work out session.   If a draining area cannot be covered:   Do not play sports.   Children should not go to daycare until the wound has healed or until fluid (drainage) stops coming out of the wound.   See your doctor for a follow-up visit as told.  GET HELP RIGHT AWAY IF:   There is more pain, puffiness (swelling), and redness in the wound site.   There is fluid or bleeding from the wound site.   You have muscle aches, chills, fever, or feel sick.   You or your child has a temperature by mouth above 102 F (38.9 C), not controlled by medicine.   Your baby is older than 3 months with a rectal temperature of 102 F (38.9 C) or higher.  MAKE SURE YOU:   Understand these instructions.   Will watch your condition.   Will get help right away if you are not doing well or get worse.  Document Released: 06/11/2007 Document Revised: 12/12/2010 Document Reviewed: 06/11/2007 [facility] Patient Information 2012 [place], [place].

## 2011-08-06 ENCOUNTER — Ambulatory Visit (INDEPENDENT_AMBULATORY_CARE_PROVIDER_SITE_OTHER): Payer: Medicare Other | Admitting: Family Medicine

## 2011-08-06 ENCOUNTER — Encounter: Payer: Self-pay | Admitting: Family Medicine

## 2011-08-06 VITALS — BP 128/81 | HR 89 | Temp 98.9°F

## 2011-08-06 DIAGNOSIS — L0390 Cellulitis, unspecified: Secondary | ICD-10-CM | POA: Diagnosis not present

## 2011-08-06 DIAGNOSIS — L0291 Cutaneous abscess, unspecified: Secondary | ICD-10-CM | POA: Diagnosis not present

## 2011-08-06 MED ORDER — CEFTRIAXONE SODIUM 1 G IJ SOLR
1.0000 g | Freq: Once | INTRAMUSCULAR | Status: AC
  Administered 2011-08-06: 1 g via INTRAMUSCULAR

## 2011-08-06 NOTE — Assessment & Plan Note (Signed)
Improving with Lasix. Patient to start going to the edema clinic. Has appointment.

## 2011-08-06 NOTE — Addendum Note (Signed)
Addended by: Barnie Alderman on: 08/06/2011 04:18 PM   Modules accepted: Orders

## 2011-08-06 NOTE — Assessment & Plan Note (Signed)
Resolved. Passed kidney stone.

## 2011-08-06 NOTE — Progress Notes (Signed)
  Subjective:    Patient ID: Jordan Jensen, male    DOB: [birth year], 41 y.o.   MRN: [id number]  HPI Chief complaint: Kidney stones, right chest abscess, OSA  Kidney stones: Patient seen at [facility] on 7/27 for increasing complaints of abdominal pain and dysuria. Diagnosed with kidney stone. Given pain medications. Urine was not strained the patient feels he is past the stone has his abdominal pain and dysuria has resolved. Denies family history of kidney stones. He eats a well-balanced diet without a lot of sodas or highly processed foods. No previous history of kidney stones. Denies dysuria, hematuria, fever, diaphoresis, flank pain.  Right chest abscess: Approximately 5 days ago the patient developed small area of induration and pain under the right breast. This grew in size significantly over the next 2 days and began to drain. Patient went for evaluation at [facility] emergency room on 7-27. Diagnosed with abscess and placed on Bactrim twice a day. Since that time area has continued to drain soaking multiple shirts per day. Area has become increasingly painful with continued redness. Patient is compliant with Bactrim regimen. Pain fairly well controlled with Vicodin. Pain is nonradiating and is described as very tender. Discharge from affected area is purulent to watery to bloody. Patient denies ever having anything like this before. Patient denies any CT, ultrasound, or MRI of the affected area. Patient felt febrile with chills and myalgias on 7-27 through 7-28, with resolution of those symptoms  Now.  Acid reflux: Patient completed to recourse of Nexium at previous visit. Since that time has been without symptoms of reflux/heartburn. Denies dysphasia for solids or liquids.  Lower extremity edema: Patient has now signed up to work with lymphedema clinic for [facility]. Patient is motivated and excited to go. Edema is slightly improved in lower extremities per patient. Patient feels Lasix therapy is  improving. Denies shortness of breath, chest pain, lower extreme the pain  Obstructive sleep apnea: Patient has contacted sleep study lab and has appointment to go for sleep study.  Review of Systems Per history of present illness    Objective:   Physical Exam  General: No acute distress, morbidly obese Abdominal: Soft, nontender to palpation Skin: Right breast erythema with significant induration of the inferior aspect of right breast. 2 sites of open drainage with purulent and bloody drainage. Tender to palpation Cardiovascular: Regular rate and rhythm Respiratory: Clear to auscultation bilaterally, normal effort Neuro: Cranial nerves grossly intact, normal mentation      Assessment & Plan:

## 2011-08-06 NOTE — Assessment & Plan Note (Signed)
>>  ASSESSMENT AND PLAN FOR BILATERAL LEG EDEMA WRITTEN ON 08/06/2011  6:13 AM BY MERRELL, Elmon Else, MD  Improving with Lasix. Patient to start going to the edema clinic. Has appointment.

## 2011-08-06 NOTE — Assessment & Plan Note (Signed)
Improved.  Marked with dotted line margin under where majority of erythema is located today.  Do not feel other site needs further I & D at this point at is openly draining.  Gave rocephin 1 gm today (will be second cumulative dose) advised to continue bactrim, and keep follow-up appt tomorrow.  Advised warm compresses to keep area draining, wash with antibacterial soap and dry carefully  Will follow-up tomorrow with PCP

## 2011-08-06 NOTE — Patient Instructions (Addendum)
Looks like it is healing well. Keep taking your antibiotics  Wash area with soap and water, dry carefully,  Warm compresses to keep pus draining out.  Keep follow-up with Dr. Konrad Dolores

## 2011-08-06 NOTE — Progress Notes (Signed)
  Subjective:    Patient ID: Jordan Jensen, male    DOB: 27-Feb-1970, 41 y.o.   MRN: [id number]  HPI Here for follow-up of cellulitis and abscess  Right chest abscess.  Was seen at outside hospital on 7/27, placed on bactrim  Was seen 7/30 by Dr. Konrad Dolores and was drainage and packed.  Given 1 gm rocephin.  Patient continued on Bactrim.  Returns today, notes lots of pus drainage.   Pain improved.  No fever, chills   Review of Systemssee HPI     Objective:   Physical Exam Gen: NAD, in wheelchair, morbidly obese Under right breast, pendulous:   Area of erythema regressed from line marked yesterday.  Packing in place, small 1 mm hole superolateral to packed incision drainage purulent fluid, able to express pus.       Assessment & Plan:

## 2011-08-06 NOTE — Assessment & Plan Note (Addendum)
Significant abscess under right breast that was drained in clinic. Abscess pocket approximately 4-5 cm deep. Area cleaned with alcohol prep pads. Cultures obtained. Wound opened w/ Qtip. Packed with approximately 12 cm iodoform gauze. Patient given instructions concerning drainage and care. Likely second pocket that may need draining more superior to area of drainage with gauze in place.. Patient to return to clinic on 7/31 for further evaluation. May need ultrasound to confirm no further abscess. May need hospital admission for IV biotics. 1 g of Rocephin in clinic today. Patient to continue with Bactrim twice a day and Vicodin for pain. Area of cellulitis clearly demarcated with skin pen

## 2011-08-06 NOTE — Assessment & Plan Note (Signed)
Patient with appointment for sleep study lab, will followup with results.

## 2011-08-06 NOTE — Assessment & Plan Note (Signed)
Resolved. No need for further intervention at this time. May need to restart the future with PPI.

## 2011-08-06 NOTE — Assessment & Plan Note (Signed)
Blood pressure well controlled with Lasix. Denies symptoms of hypotension. Continue with current therapy.

## 2011-08-07 ENCOUNTER — Encounter: Payer: Self-pay | Admitting: Family Medicine

## 2011-08-07 ENCOUNTER — Ambulatory Visit (INDEPENDENT_AMBULATORY_CARE_PROVIDER_SITE_OTHER): Payer: Medicare Other | Admitting: Family Medicine

## 2011-08-07 VITALS — BP 136/84 | HR 84 | Temp 98.2°F

## 2011-08-07 DIAGNOSIS — L0390 Cellulitis, unspecified: Secondary | ICD-10-CM

## 2011-08-07 DIAGNOSIS — L0291 Cutaneous abscess, unspecified: Secondary | ICD-10-CM | POA: Diagnosis not present

## 2011-08-07 MED ORDER — CEFTRIAXONE SODIUM 1 G IJ SOLR
1.0000 g | Freq: Once | INTRAMUSCULAR | Status: AC
  Administered 2011-08-07: 1 g via INTRAMUSCULAR

## 2011-08-07 NOTE — Assessment & Plan Note (Signed)
Replaced iodiform gauze/wick. Pt to remove Sat morning. Rocephin today. To complete bactrim course ~10-14 days. Red flags discussed.

## 2011-08-07 NOTE — Progress Notes (Signed)
  Subjective:    Patient ID: Jordan Jensen, male    DOB: Jan 30, 1970, 41 y.o.   MRN: [id number]  HPI CC: R breast abscess   Abscess: pain improved. No longer needing vicodin. Slept well last night for first time in days. Awoke this am feeling much better. Packing came out yesterday evening during dressing change. Continues to take bactrim. Drainage minimal. Denies fevers, malaise, n/v/d/c, HA, rash    Review of Systems Per HPI    Objective:   Physical Exam Gen: NAD, obese, wheelchair bound Skin: Mild erythema of R breast. 1 cm opening at inferior aspect of breast w/ minimal purulent and mostly bloody discharge. Wound pocket approximately 2-3cm deep. Induration improved. Wound probed w/ blunt instrumentation. Iiodiform gauze placed (10cm).        Assessment & Plan:

## 2011-08-07 NOTE — Patient Instructions (Addendum)
You are doing great. Please continue to take your antibiotics until the are completely gone Please leave the gauze in the wound until Saturday morning.  If the pain gets worse or if you start feeling poorly come back to see me Have a great weekend

## 2011-08-08 ENCOUNTER — Ambulatory Visit: Payer: Medicare Other | Admitting: Rehabilitative and Restorative Service Providers"

## 2011-08-08 LAB — WOUND CULTURE
Gram Stain: NONE SEEN
Gram Stain: NONE SEEN
Organism ID, Bacteria: NO GROWTH

## 2011-08-14 ENCOUNTER — Ambulatory Visit: Payer: Medicare Other | Admitting: Physical Therapy

## 2011-08-14 ENCOUNTER — Other Ambulatory Visit: Payer: Self-pay | Admitting: Family Medicine

## 2011-08-14 ENCOUNTER — Telehealth: Payer: Self-pay | Admitting: Home Health Services

## 2011-08-14 DIAGNOSIS — IMO0002 Reserved for concepts with insufficient information to code with codable children: Secondary | ICD-10-CM

## 2011-08-14 NOTE — Telephone Encounter (Signed)
Spoke with Theron.  Pt reports feeling better.    Pt reports avoiding fast food and has been focusing on eating at home . Has increase eating fruits/vegetables at the house.  Pt has not started any regular exercise routine due to abscess on arm.  Pt reports abscess feeling a lot better.   Pt is schedule to start physical therapy tomorrow 8/9.   Pt set goal to continue to avoid fast food, eating at home and to be consistent with PT exercises.  Pt's overall goal is weight loss.

## 2011-08-15 ENCOUNTER — Ambulatory Visit: Payer: Medicare Other | Attending: Family Medicine | Admitting: Physical Therapy

## 2011-08-15 DIAGNOSIS — IMO0001 Reserved for inherently not codable concepts without codable children: Secondary | ICD-10-CM | POA: Insufficient documentation

## 2011-08-15 DIAGNOSIS — R269 Unspecified abnormalities of gait and mobility: Secondary | ICD-10-CM | POA: Insufficient documentation

## 2011-08-21 ENCOUNTER — Ambulatory Visit (HOSPITAL_BASED_OUTPATIENT_CLINIC_OR_DEPARTMENT_OTHER): Payer: Medicare Other

## 2011-08-22 ENCOUNTER — Telehealth: Payer: Self-pay | Admitting: Home Health Services

## 2011-08-22 NOTE — Telephone Encounter (Signed)
Spoke with Pleas.  Pt reports taking medications and feels good.  Pt reports finishing anti-biotic for abscess but there is still a small bump and it drains periodically.  Pt reports continuing diet, avoiding fast food.  Pt reports other are noticing weight loss.  Pt also reports more range of motion and strength.   Pt has PT scheduled for next 3 weeks.    Pt has looked into joining the [facility] for additional exercise.  Pt set goal for this week to continue with diet modifications, go to PT, do PT exercises daily at home.  Pt's overall goal is weight loss, htn management.

## 2011-08-28 ENCOUNTER — Telehealth: Payer: Self-pay | Admitting: Home Health Services

## 2011-08-28 NOTE — Telephone Encounter (Signed)
Spoke with Jordan Jensen.  Pt reports feeling great.  More energy, able to move more, is happy.  Pt reports doing PT exercises daily.  Pt reports other are making comments how his weight loss, doesn't know how much weight he has lost.  Pt reports continued success with his diet modification.  Has been avoiding fast food and snack type foods.  Has been eating low carb meals with his mother who is a diabetic.  Pt's goal this next week is to continue with dietary changes and pt exercises.  Pt's over all goal is weight loss.

## 2011-08-29 NOTE — Telephone Encounter (Signed)
Thanks for your help w/ Jordan Jensen.

## 2011-09-01 ENCOUNTER — Ambulatory Visit: Payer: Medicare Other | Admitting: Physical Therapy

## 2011-09-02 ENCOUNTER — Encounter: Payer: Self-pay | Admitting: Family Medicine

## 2011-09-02 NOTE — Telephone Encounter (Signed)
Error

## 2011-09-02 NOTE — Telephone Encounter (Signed)
This encounter was created in error - please disregard.

## 2011-09-05 ENCOUNTER — Telehealth: Payer: Self-pay | Admitting: Home Health Services

## 2011-09-05 NOTE — Telephone Encounter (Signed)
Spoke with [name].  Pt feeling good.  Pt reports exercising so-so.  Isn't doing as much as in previous weeks. 2x this past week. Pt reports doing well with diet, not eating fast food.  Choosing healthy options.   Pt set goal to continue with diet.  Exercise 3x this week.   Pt has some [employer] forgivness paper work he will need filled out.  Said he would fax it to [place] for PCP signature.  Pt's overall goal is weight loss.

## 2011-09-09 ENCOUNTER — Ambulatory Visit: Payer: Medicare Other | Admitting: Physical Therapy

## 2011-09-16 ENCOUNTER — Ambulatory Visit: Payer: Medicare Other | Admitting: Physical Therapy

## 2011-10-01 ENCOUNTER — Telehealth: Payer: Self-pay | Admitting: Home Health Services

## 2011-10-01 NOTE — Telephone Encounter (Signed)
Spoke with Jordan Jensen.  Pt reports doing well.  Has continued with diet for 2 + months.  Pt reports avoiding fast foods, eating at home, portion control.   Pt has not started any regular exercise routines.  We dicussed exercises being the next step in weight loss.  Pt has fu appointment with PCP 9/26 for weight check and abscess fu.   Pt's overall goal is weight loss and htn management.

## 2011-10-02 ENCOUNTER — Encounter: Payer: Self-pay | Admitting: Family Medicine

## 2011-10-02 ENCOUNTER — Ambulatory Visit (INDEPENDENT_AMBULATORY_CARE_PROVIDER_SITE_OTHER): Payer: Medicare Other | Admitting: Family Medicine

## 2011-10-02 VITALS — BP 110/71 | HR 76 | Temp 98.3°F | Wt 355.0 lb

## 2011-10-02 DIAGNOSIS — L0291 Cutaneous abscess, unspecified: Secondary | ICD-10-CM | POA: Diagnosis not present

## 2011-10-02 DIAGNOSIS — R600 Localized edema: Secondary | ICD-10-CM

## 2011-10-02 DIAGNOSIS — I10 Essential (primary) hypertension: Secondary | ICD-10-CM

## 2011-10-02 DIAGNOSIS — E669 Obesity, unspecified: Secondary | ICD-10-CM | POA: Diagnosis not present

## 2011-10-02 DIAGNOSIS — R609 Edema, unspecified: Secondary | ICD-10-CM | POA: Diagnosis not present

## 2011-10-02 DIAGNOSIS — L0390 Cellulitis, unspecified: Secondary | ICD-10-CM

## 2011-10-02 DIAGNOSIS — IMO0002 Reserved for concepts with insufficient information to code with codable children: Secondary | ICD-10-CM

## 2011-10-02 DIAGNOSIS — R238 Other skin changes: Secondary | ICD-10-CM | POA: Insufficient documentation

## 2011-10-02 DIAGNOSIS — L989 Disorder of the skin and subcutaneous tissue, unspecified: Secondary | ICD-10-CM | POA: Diagnosis not present

## 2011-10-02 LAB — BASIC METABOLIC PANEL
BUN: 14 mg/dL (ref 6–23)
Calcium: 9.4 mg/dL (ref 8.4–10.5)
Creat: 1.08 mg/dL (ref 0.50–1.35)
Glucose, Bld: 82 mg/dL (ref 70–99)

## 2011-10-02 MED ORDER — HYDROCORTISONE 1 % EX CREA: TOPICAL_CREAM | Freq: Two times a day (BID) | CUTANEOUS | Status: DC

## 2011-10-02 NOTE — Assessment & Plan Note (Addendum)
50lb wt loss in 3 mo. Continue with the dietary changes and exercise where able COntinue to meet with [facility] coach Arlys John

## 2011-10-02 NOTE — Patient Instructions (Addendum)
You are doing great. Continue working with Jordan Jensen I have put in the physical therapy referral to the [facility] outpatient physical therapy facility Continue to keep the healing abscess dry and use antibiotic ointment as necessary Please use the hydrocortisone cream on your legs as necessary for irritation

## 2011-10-02 NOTE — Assessment & Plan Note (Addendum)
KOH negative for yeast Hydrocortisone cream 1% PRN Continue compression hose and leg elevation

## 2011-10-02 NOTE — Assessment & Plan Note (Addendum)
Continues to be a problem but improved overall BMET today showed normal renal function OK to continue Lasix Referral to PT/Lymphedema clinic in [place]

## 2011-10-02 NOTE — Assessment & Plan Note (Signed)
>>  ASSESSMENT AND PLAN FOR BILATERAL LEG EDEMA WRITTEN ON 10/03/2011  6:25 AM BY MERRELL, DAVID J, MD  Continues to be a problem but improved overall BMET today showed normal renal function OK to continue Lasix Referral to PT/Lymphedema clinic in [place]

## 2011-10-02 NOTE — Assessment & Plan Note (Signed)
Open lesion still. Non-infected Pt to keep area dry and use triple ABX cream PRN Pt aware of s/s of infectiona nd to call if necessary

## 2011-10-03 ENCOUNTER — Encounter: Payer: Self-pay | Admitting: Family Medicine

## 2011-10-03 NOTE — Progress Notes (Addendum)
  Subjective:    Patient ID: Jordan Jensen, male    DOB: [birth year], 40 y.o.   MRN: [id number]  HPI CC : wt loss, LE skin irritation, R chest abscess, LE edema  Wt loss: meeting w/ health coach, Arlys John. Has cut all fast food and fried food out of diet. Minimal exercise due to spinal injury and obesity. Feels much better now. Wt down 50lbs in 3 mo. Denies any n/v/d/c. Feels better overall  HTN: Taking lasix. Denies CP, SOB, palpitations. No orthostasis  Abscess: Drained end of June. COmpleted ABX as prescribed. Continues to drain serosanguanous material from time to time. Non-painful to patient. Stays moist as located under R breast. Denies fever, rash.   Skin irritation: LE skin irritation present for several months. Creates itching sensation for pt. Worse when legs swell. Improves w/ diuresis and compression hose.   LE edema: present since injury as noted in previous visits. Unable to go to PT here in [place] due to insurance and not taking edemaa pts unless cancer pts. Would like to go to [place]. Improves w/ diuresis and compression hose.  PMHx and social history reviewed and appropriate changes made  Review of Systems Per hpi    Objective:   Physical Exam Gen: NAD, obese SKin: R breast w/ 1x1cm lesion of pink healing tissue w/ small pinhole w/ no active discharge. Non-erythematous and non-painful. Pink, firm, shiny appearance of skin in LE w/ dry scaly patches Extremities: 4 pitting edema of le.        Assessment & Plan:

## 2011-10-03 NOTE — Assessment & Plan Note (Signed)
Referral to [facility] PT as below

## 2011-10-03 NOTE — Assessment & Plan Note (Signed)
Well controlled.  Continue lasix 20mg  qday

## 2011-10-10 ENCOUNTER — Telehealth: Payer: Self-pay | Admitting: Home Health Services

## 2011-10-10 DIAGNOSIS — E669 Obesity, unspecified: Secondary | ICD-10-CM

## 2011-10-10 NOTE — Telephone Encounter (Signed)
Spoke with Jordan Jensen.  Pt reports feeling great, has more energy, encourage by recent wt loss.  Pt reported continuing diet this past week of reduced portions and fat/carb reduction.  Pt reported joining a gym near home and has gone 2x since.  Pt also reports exercising at home (stretches/free wts).  Pt is bored with meal choices and is looking for help to create meals plans to continue wt loss.  Suggested him meeting with dietician for continued support in this area.    Pt's goal this next week is to continue with diet modifications and to go to gym 3 times.  Pt's overall goal is wt loss.

## 2011-10-11 NOTE — Assessment & Plan Note (Signed)
Order placed for pt to meet w/ Dr. Gerilyn Pilgrim Pt very motivated and doing great

## 2011-10-11 NOTE — Addendum Note (Signed)
Addended by: Konrad Dolores, Yaviel Kloster J on: 10/11/2011 10:49 AM   Modules accepted: Orders

## 2011-10-22 ENCOUNTER — Telehealth: Payer: Self-pay | Admitting: Family Medicine

## 2011-10-22 NOTE — Telephone Encounter (Signed)
Will forward to PCP 

## 2011-10-22 NOTE — Telephone Encounter (Signed)
Pt is asking about paperwork that he dropped off last week.  Wants to know about the status

## 2011-10-23 NOTE — Telephone Encounter (Signed)
Paperwork done Please call pt

## 2011-10-23 NOTE — Telephone Encounter (Signed)
Spoke with patient and informed him that paperwork is ready, he asks that I mail it to him. Paperwork work mailed, copy left up front to be scanned into patient chart.

## 2011-10-31 ENCOUNTER — Telehealth: Payer: Self-pay | Admitting: Home Health Services

## 2011-10-31 NOTE — Telephone Encounter (Signed)
Left message for Fields for 3 weeks regarding follow up for [facility] [employer] weight loss and to give him the phone number of the dietician.  Pt has not returned phone calls.

## 2011-11-05 DIAGNOSIS — Z87828 Personal history of other (healed) physical injury and trauma: Secondary | ICD-10-CM | POA: Diagnosis not present

## 2011-11-05 DIAGNOSIS — IMO0001 Reserved for inherently not codable concepts without codable children: Secondary | ICD-10-CM | POA: Diagnosis not present

## 2011-11-05 DIAGNOSIS — I890 Lymphedema, not elsewhere classified: Secondary | ICD-10-CM | POA: Diagnosis not present

## 2011-11-06 ENCOUNTER — Inpatient Hospital Stay (HOSPITAL_COMMUNITY)
Admission: EM | Admit: 2011-11-06 | Discharge: 2011-11-12 | DRG: 603 | Disposition: A | Discharge status: Home / Self Care | Payer: Medicare Other | Attending: Internal Medicine | Admitting: Internal Medicine

## 2011-11-06 ENCOUNTER — Encounter (HOSPITAL_COMMUNITY): Payer: Self-pay | Admitting: Emergency Medicine

## 2011-11-06 ENCOUNTER — Telehealth: Payer: Self-pay | Admitting: Home Health Services

## 2011-11-06 DIAGNOSIS — Z6841 Body Mass Index (BMI) 40.0 and over, adult: Secondary | ICD-10-CM

## 2011-11-06 DIAGNOSIS — E669 Obesity, unspecified: Secondary | ICD-10-CM

## 2011-11-06 DIAGNOSIS — Z79899 Other long term (current) drug therapy: Secondary | ICD-10-CM

## 2011-11-06 DIAGNOSIS — R600 Localized edema: Secondary | ICD-10-CM

## 2011-11-06 DIAGNOSIS — S21009A Unspecified open wound of unspecified breast, initial encounter: Secondary | ICD-10-CM | POA: Diagnosis present

## 2011-11-06 DIAGNOSIS — R609 Edema, unspecified: Secondary | ICD-10-CM | POA: Diagnosis not present

## 2011-11-06 DIAGNOSIS — L03119 Cellulitis of unspecified part of limb: Principal | ICD-10-CM | POA: Diagnosis present

## 2011-11-06 DIAGNOSIS — R0683 Snoring: Secondary | ICD-10-CM

## 2011-11-06 DIAGNOSIS — I890 Lymphedema, not elsewhere classified: Secondary | ICD-10-CM | POA: Diagnosis present

## 2011-11-06 DIAGNOSIS — L0291 Cutaneous abscess, unspecified: Secondary | ICD-10-CM

## 2011-11-06 DIAGNOSIS — R238 Other skin changes: Secondary | ICD-10-CM

## 2011-11-06 DIAGNOSIS — I10 Essential (primary) hypertension: Secondary | ICD-10-CM | POA: Diagnosis not present

## 2011-11-06 DIAGNOSIS — Z96649 Presence of unspecified artificial hip joint: Secondary | ICD-10-CM

## 2011-11-06 DIAGNOSIS — L02419 Cutaneous abscess of limb, unspecified: Principal | ICD-10-CM | POA: Diagnosis present

## 2011-11-06 DIAGNOSIS — Z981 Arthrodesis status: Secondary | ICD-10-CM

## 2011-11-06 DIAGNOSIS — G9589 Other specified diseases of spinal cord: Secondary | ICD-10-CM | POA: Diagnosis present

## 2011-11-06 DIAGNOSIS — Z87891 Personal history of nicotine dependence: Secondary | ICD-10-CM

## 2011-11-06 DIAGNOSIS — Y92410 Unspecified street and highway as the place of occurrence of the external cause: Secondary | ICD-10-CM

## 2011-11-06 DIAGNOSIS — L0390 Cellulitis, unspecified: Secondary | ICD-10-CM

## 2011-11-06 DIAGNOSIS — R51 Headache: Secondary | ICD-10-CM | POA: Diagnosis not present

## 2011-11-06 DIAGNOSIS — IMO0002 Reserved for concepts with insufficient information to code with codable children: Secondary | ICD-10-CM

## 2011-11-06 DIAGNOSIS — L03116 Cellulitis of left lower limb: Secondary | ICD-10-CM

## 2011-11-06 DIAGNOSIS — R6889 Other general symptoms and signs: Secondary | ICD-10-CM | POA: Diagnosis not present

## 2011-11-06 NOTE — ED Notes (Signed)
Pt presents with multiple complaints, pt has n/v that started this evening. Also notes pain and swelling to both legs but notes the left leg is more painful. Swelling and pitting edema notes bilaterally. Pt was febrile while en route at 101.5 and given 1000mg  tylenol by EMS. Temp 100.2 at triage. Pt is prescribed lasix daily but states he does not take it everyday.

## 2011-11-06 NOTE — Telephone Encounter (Signed)
Spoke with Jordan Jensen.  Pt reports feeling okay.  Pt reports exercising at gym 2 x a week.  Reports still trying to eat healthy but has been eating more subs recently.  He believes he may have gained 5 lbs over last few weeks.  Pt is bored with food options and is starting to go back to previous eating habits.  Pt expressed interest in working with dietician for continued motivation. Gave phone number of [name] to pt to schedule an appointment.  Pt reports legs are feeling fine and has PT/message therapy schedule for next week.  Pt set goal to continue to go to gym 2x a week and to continue with smaller portions and avoid fast foods.   Pt's overall all goal is weight loss.

## 2011-11-07 ENCOUNTER — Encounter (HOSPITAL_COMMUNITY): Payer: Self-pay | Admitting: Internal Medicine

## 2011-11-07 DIAGNOSIS — Z87891 Personal history of nicotine dependence: Secondary | ICD-10-CM | POA: Diagnosis not present

## 2011-11-07 DIAGNOSIS — S21009A Unspecified open wound of unspecified breast, initial encounter: Secondary | ICD-10-CM | POA: Diagnosis not present

## 2011-11-07 DIAGNOSIS — E669 Obesity, unspecified: Secondary | ICD-10-CM | POA: Diagnosis not present

## 2011-11-07 DIAGNOSIS — L0291 Cutaneous abscess, unspecified: Secondary | ICD-10-CM

## 2011-11-07 DIAGNOSIS — Z79899 Other long term (current) drug therapy: Secondary | ICD-10-CM | POA: Diagnosis not present

## 2011-11-07 DIAGNOSIS — I10 Essential (primary) hypertension: Secondary | ICD-10-CM | POA: Diagnosis not present

## 2011-11-07 DIAGNOSIS — L03116 Cellulitis of left lower limb: Secondary | ICD-10-CM | POA: Diagnosis present

## 2011-11-07 DIAGNOSIS — L02419 Cutaneous abscess of limb, unspecified: Principal | ICD-10-CM

## 2011-11-07 DIAGNOSIS — R609 Edema, unspecified: Secondary | ICD-10-CM | POA: Diagnosis not present

## 2011-11-07 DIAGNOSIS — Z981 Arthrodesis status: Secondary | ICD-10-CM | POA: Diagnosis not present

## 2011-11-07 DIAGNOSIS — L0390 Cellulitis, unspecified: Secondary | ICD-10-CM

## 2011-11-07 DIAGNOSIS — Z6841 Body Mass Index (BMI) 40.0 and over, adult: Secondary | ICD-10-CM | POA: Diagnosis not present

## 2011-11-07 DIAGNOSIS — Z96649 Presence of unspecified artificial hip joint: Secondary | ICD-10-CM | POA: Diagnosis not present

## 2011-11-07 DIAGNOSIS — R51 Headache: Secondary | ICD-10-CM | POA: Diagnosis not present

## 2011-11-07 DIAGNOSIS — L03119 Cellulitis of unspecified part of limb: Principal | ICD-10-CM

## 2011-11-07 DIAGNOSIS — M79609 Pain in unspecified limb: Secondary | ICD-10-CM | POA: Diagnosis not present

## 2011-11-07 DIAGNOSIS — I890 Lymphedema, not elsewhere classified: Secondary | ICD-10-CM | POA: Diagnosis not present

## 2011-11-07 DIAGNOSIS — IMO0002 Reserved for concepts with insufficient information to code with codable children: Secondary | ICD-10-CM | POA: Diagnosis not present

## 2011-11-07 LAB — COMPREHENSIVE METABOLIC PANEL
ALT: 18 U/L (ref 0–53)
AST: 20 U/L (ref 0–37)
Albumin: 3.2 g/dL — ABNORMAL LOW (ref 3.5–5.2)
Alkaline Phosphatase: 40 U/L (ref 39–117)
Chloride: 103 mEq/L (ref 96–112)
Potassium: 4 mEq/L (ref 3.5–5.1)
Sodium: 136 mEq/L (ref 135–145)
Total Bilirubin: 1.5 mg/dL — ABNORMAL HIGH (ref 0.3–1.2)
Total Protein: 6.6 g/dL (ref 6.0–8.3)

## 2011-11-07 LAB — URINALYSIS, ROUTINE W REFLEX MICROSCOPIC
Glucose, UA: NEGATIVE mg/dL
Hgb urine dipstick: NEGATIVE
Leukocytes, UA: NEGATIVE
Specific Gravity, Urine: >1.03 — ABNORMAL HIGH (ref 1.005–1.030)
Urobilinogen, UA: 0.2 mg/dL (ref 0.0–1.0)

## 2011-11-07 LAB — BASIC METABOLIC PANEL
BUN: 10 mg/dL (ref 6–23)
CO2: 24 mEq/L (ref 19–32)
Chloride: 102 mEq/L (ref 96–112)
Creatinine, Ser: 0.98 mg/dL (ref 0.50–1.35)
Glucose, Bld: 104 mg/dL — ABNORMAL HIGH (ref 70–99)
Potassium: 3.5 mEq/L (ref 3.5–5.1)

## 2011-11-07 LAB — CBC
HCT: 41.4 % (ref 39.0–52.0)
Hemoglobin: 14 g/dL (ref 13.0–17.0)
MCHC: 33.6 g/dL (ref 30.0–36.0)
MCV: 89 fL (ref 78.0–100.0)
Platelets: 153 10*3/uL (ref 150–400)
RDW: 13.7 % (ref 11.5–15.5)
WBC: 14.5 10*3/uL — ABNORMAL HIGH (ref 4.0–10.5)
WBC: 19.2 10*3/uL — ABNORMAL HIGH (ref 4.0–10.5)

## 2011-11-07 LAB — MAGNESIUM: Magnesium: 1.6 mg/dL (ref 1.5–2.5)

## 2011-11-07 LAB — MRSA PCR SCREENING: MRSA by PCR: NEGATIVE

## 2011-11-07 MED ORDER — ENOXAPARIN SODIUM 40 MG/0.4ML ~~LOC~~ SOLN
40.0000 mg | SUBCUTANEOUS | Status: DC
  Administered 2011-11-07: 40 mg via SUBCUTANEOUS
  Filled 2011-11-07: qty 0.4

## 2011-11-07 MED ORDER — OXYCODONE HCL 5 MG PO TABS
5.0000 mg | ORAL_TABLET | ORAL | Status: DC | PRN
  Administered 2011-11-07 (×3): 5 mg via ORAL
  Filled 2011-11-07 (×3): qty 1

## 2011-11-07 MED ORDER — CLINDAMYCIN PHOSPHATE 900 MG/50ML IV SOLN
INTRAVENOUS | Status: AC
  Filled 2011-11-07: qty 50

## 2011-11-07 MED ORDER — VANCOMYCIN HCL IN DEXTROSE 1-5 GM/200ML-% IV SOLN
1000.0000 mg | Freq: Once | INTRAVENOUS | Status: AC
  Administered 2011-11-07: 1000 mg via INTRAVENOUS
  Filled 2011-11-07: qty 200

## 2011-11-07 MED ORDER — TRAZODONE HCL 50 MG PO TABS
25.0000 mg | ORAL_TABLET | Freq: Every evening | ORAL | Status: DC | PRN
  Filled 2011-11-07: qty 1

## 2011-11-07 MED ORDER — HYDROMORPHONE HCL PF 1 MG/ML IJ SOLN: 0.5000 mg | INTRAMUSCULAR | Status: DC | PRN

## 2011-11-07 MED ORDER — HYDROMORPHONE HCL PF 1 MG/ML IJ SOLN
1.0000 mg | Freq: Once | INTRAMUSCULAR | Status: AC
  Administered 2011-11-07: 1 mg via INTRAVENOUS
  Filled 2011-11-07: qty 1

## 2011-11-07 MED ORDER — ACETAMINOPHEN 650 MG RE SUPP: 650.0000 mg | Freq: Four times a day (QID) | RECTAL | Status: DC | PRN

## 2011-11-07 MED ORDER — ONDANSETRON HCL 4 MG/2ML IJ SOLN
4.0000 mg | Freq: Once | INTRAMUSCULAR | Status: AC
  Administered 2011-11-07: 4 mg via INTRAVENOUS
  Filled 2011-11-07: qty 2

## 2011-11-07 MED ORDER — INFLUENZA VIRUS VACC SPLIT PF IM SUSP
0.5000 mL | INTRAMUSCULAR | Status: AC
  Administered 2011-11-07: 0.5 mL via INTRAMUSCULAR
  Filled 2011-11-07: qty 0.5

## 2011-11-07 MED ORDER — POTASSIUM CHLORIDE IN NACL 20-0.9 MEQ/L-% IV SOLN
INTRAVENOUS | Status: DC
  Administered 2011-11-07: 04:00:00 via INTRAVENOUS
  Administered 2011-11-08: 20 mL/h via INTRAVENOUS

## 2011-11-07 MED ORDER — ONDANSETRON HCL 4 MG/2ML IJ SOLN: 4.0000 mg | Freq: Four times a day (QID) | INTRAMUSCULAR | Status: DC | PRN

## 2011-11-07 MED ORDER — SODIUM CHLORIDE 0.9 % IV SOLN
1500.0000 mg | Freq: Two times a day (BID) | INTRAVENOUS | Status: DC
  Administered 2011-11-07 – 2011-11-08 (×3): 1500 mg via INTRAVENOUS
  Filled 2011-11-07 (×7): qty 1500

## 2011-11-07 MED ORDER — ENOXAPARIN SODIUM 80 MG/0.8ML ~~LOC~~ SOLN
80.0000 mg | SUBCUTANEOUS | Status: DC
  Administered 2011-11-08 – 2011-11-12 (×5): 80 mg via SUBCUTANEOUS
  Filled 2011-11-07 (×5): qty 0.8

## 2011-11-07 MED ORDER — SODIUM CHLORIDE 0.9 % IJ SOLN
3.0000 mL | Freq: Two times a day (BID) | INTRAMUSCULAR | Status: DC
  Administered 2011-11-08 – 2011-11-11 (×5): 3 mL via INTRAVENOUS
  Filled 2011-11-07 (×7): qty 3

## 2011-11-07 MED ORDER — PNEUMOCOCCAL VAC POLYVALENT 25 MCG/0.5ML IJ INJ
0.5000 mL | INJECTION | INTRAMUSCULAR | Status: AC
  Administered 2011-11-07: 0.5 mL via INTRAMUSCULAR
  Filled 2011-11-07: qty 0.5

## 2011-11-07 MED ORDER — ACETAMINOPHEN 325 MG PO TABS
650.0000 mg | ORAL_TABLET | ORAL | Status: DC | PRN
  Administered 2011-11-07 – 2011-11-08 (×5): 650 mg via ORAL
  Filled 2011-11-07 (×4): qty 2

## 2011-11-07 MED ORDER — ONDANSETRON HCL 4 MG PO TABS: 4.0000 mg | ORAL_TABLET | Freq: Four times a day (QID) | ORAL | Status: DC | PRN

## 2011-11-07 MED ORDER — SODIUM CHLORIDE 0.9 % IV BOLUS (SEPSIS)
1000.0000 mL | Freq: Once | INTRAVENOUS | Status: AC
  Administered 2011-11-07: 1000 mL via INTRAVENOUS

## 2011-11-07 MED ORDER — DEXTROSE 5 % IV SOLN
900.0000 mg | Freq: Once | INTRAVENOUS | Status: AC
  Administered 2011-11-07: 900 mg via INTRAVENOUS
  Filled 2011-11-07: qty 6

## 2011-11-07 NOTE — Progress Notes (Signed)
UR Chart Review Completed  

## 2011-11-07 NOTE — ED Notes (Signed)
Pts BP dropped to 95/44, dr Orvan Falconer called and notified. Advised to give a 1000cc NS bolus and switch the pt to Tele, Accepting nurse tracy notified.
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
## 2011-11-07 NOTE — Evaluation (Signed)
Physical Therapy Evaluation Patient Details Name: Jordan Jensen MRN: [id number] DOB: 1970-09-19 Today's Date: 11/07/2011 Time: 4098-1191 PT Time Calculation (min): 62 min  PT Assessment / Plan / Recommendation Clinical Impression  This is an extremely pleasant pt with sudden onset of cellulitis coupled with lymphedema in LEs L>R.  All mobility is limited by the extreme size of LEs and he needs assist to move LEs in and OOB.  He is able to ambulate functional distances with his crutches in a labored gait pattern.  We began ther ex for LE ROM and strengthening and he reported feeling much better following ex and gait.  His home setting sounds adequate with the exception of his bed which is too tall for him to get into without great effort.  I have suggested that he have someone remove the sleighbed frame and have the mattresses rest on a metal bedframe which should reduce the overall height of the bed..  He has been planning on starting an OP PT program of strengthening and lymphedema therapy which he will hopefully be able to begin soon after d/c.  If not, he would benefit from HHPT initially.    PT Assessment  Patient needs continued PT services    Follow Up Recommendations  Outpatient PT (HHPT if pt is not yet strong enough for OP PT)    Does the patient have the potential to tolerate intense rehabilitation      Barriers to Discharge None      Equipment Recommendations  None recommended by PT    Recommendations for Other Services     Frequency Min 3X/week    Precautions / Restrictions Precautions Precautions: Fall Restrictions Weight Bearing Restrictions: No   Pertinent Vitals/Pain       Mobility  Bed Mobility Bed Mobility: Supine to Sit;Sit to Supine Supine to Sit: HOB elevated;4: Min assist Sit to Supine: 3: Mod assist;HOB flat Details for Bed Mobility Assistance: needs assist to lift LEs into bed and OOB, again, due to the increased size of LEs Transfers Transfers: Sit  to Stand;Stand to Sit Sit to Stand: 5: Supervision;From elevated surface;From bed;Without upper extremity assist Stand to Sit: 6: Modified independent (Device/Increase time);To bed;Without upper extremity assist Ambulation/Gait Ambulation/Gait Assistance: 5: Supervision Ambulation Distance (Feet): 50 Feet Assistive device: Crutches Ambulation/Gait Assistance Details: crutches are too long for him and I offered to shorten them...he likes them long in order to lean on axilla.Marland KitchenMarland KitchenI did explain the danger, but he prefers things the way they are Gait Pattern: Step-through pattern General Gait Details: gait is extremely labored with crutches, but pt states that it got easier as he walked more Stairs: No Wheelchair Mobility Wheelchair Mobility: No    Shoulder Instructions     Exercises General Exercises - Lower Extremity Ankle Circles/Pumps: AROM;Both;10 reps;Supine Quad Sets: AROM;Both;10 reps;Supine Gluteal Sets: AROM;Both;10 reps;Supine Short Arc Quad: AAROM;Both;10 reps;Supine Heel Slides: AAROM;Both;10 reps;Supine Hip ABduction/ADduction: AAROM;Both;10 reps;Supine   PT Diagnosis: Difficulty walking;Generalized weakness  PT Problem List: Decreased strength;Decreased range of motion;Decreased activity tolerance;Decreased mobility;Obesity PT Treatment Interventions: Gait training;Functional mobility training;Therapeutic activities;Therapeutic exercise;Patient/family education   PT Goals Acute Rehab PT Goals PT Goal Formulation: With patient Time For Goal Achievement: 11/14/11 Potential to Achieve Goals: Good Pt will go Supine/Side to Sit: with supervision;with HOB not 0 degrees (comment degree) PT Goal: Supine/Side to Sit - Progress: Goal set today Pt will Ambulate: 51 - 150 feet;with supervision;with crutches PT Goal: Ambulate - Progress: Goal set today  Visit Information  Last PT Received On:  11/07/11    Subjective Data  Subjective: legs just swelled up all of a sudden Patient  Stated Goal: return home   Prior Functioning  Home Living Lives With: Family Available Help at Discharge: Family;Available 24 hours/day Type of Home: House Home Access: Ramped entrance Home Layout: One level Bathroom Shower/Tub: [employer] manager Accessibility: Yes How Accessible: Accessible via walker Home Adaptive Equipment: Wheelchair - manual;Crutches;Grab bars in shower;Grab bars around toilet Additional Comments: pt states that he does better with crutches than with a walker...crutches are too tall for him, but he wants it this way so that he can lean on axilla...he was cautioned that this could cause nerve damage but he wants to keep things the way they are Prior Function Level of Independence: Independent with assistive device(s) Able to Take Stairs?: No Driving: Yes Vocation: On disability Comments: has been on a diet and has lost 50# Communication Communication: No difficulties    Cognition  Overall Cognitive Status: Appears within functional limits for tasks assessed/performed Arousal/Alertness: Awake/alert Orientation Level: Appears intact for tasks assessed Behavior During Session: [facility] for tasks performed    Extremity/Trunk Assessment Right Lower Extremity Assessment RLE ROM/Strength/Tone: Deficits RLE ROM/Strength/Tone Deficits: strength 2/5, but probably decreased due to the severe lymphedema in LE RLE Sensation: WFL - Light Touch Left Lower Extremity Assessment LLE ROM/Strength/Tone: Deficits LLE ROM/Strength/Tone Deficits: strength 2/5 due toextremely severe lymphedema LLE Sensation: WFL - Light Touch Trunk Assessment Trunk Assessment: Normal   Balance Balance Balance Assessed: No (WNL by observation)  End of Session PT - End of Session Equipment Utilized During Treatment: Gait belt Activity Tolerance: Patient tolerated treatment well Patient left: in bed;with call bell/phone within reach  GP     Myrlene Broker L 11/07/2011, 1:43 PM

## 2011-11-07 NOTE — Progress Notes (Signed)
Chart reviewed. Patient examined. See orders. Stable
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
## 2011-11-07 NOTE — ED Notes (Addendum)
Called to give report, was told another nurse was being called in to that this patient. Will be approx 30 minutes until they can accept

## 2011-11-08 DIAGNOSIS — S21009A Unspecified open wound of unspecified breast, initial encounter: Secondary | ICD-10-CM | POA: Diagnosis present

## 2011-11-08 DIAGNOSIS — I890 Lymphedema, not elsewhere classified: Secondary | ICD-10-CM | POA: Diagnosis not present

## 2011-11-08 DIAGNOSIS — L02419 Cutaneous abscess of limb, unspecified: Secondary | ICD-10-CM | POA: Diagnosis not present

## 2011-11-08 LAB — CBC
MCH: 29.7 pg (ref 26.0–34.0)
MCHC: 32.8 g/dL (ref 30.0–36.0)
RDW: 14 % (ref 11.5–15.5)
WBC: 17.2 10*3/uL — ABNORMAL HIGH (ref 4.0–10.5)

## 2011-11-08 MED ORDER — SODIUM CHLORIDE 0.9 % IJ SOLN
INTRAMUSCULAR | Status: AC
  Administered 2011-11-09: 3 mL
  Filled 2011-11-08: qty 3

## 2011-11-08 MED ORDER — SODIUM CHLORIDE 0.9 % IV SOLN
600.0000 mg | Freq: Two times a day (BID) | INTRAVENOUS | Status: DC
  Administered 2011-11-08 – 2011-11-11 (×8): 600 mg via INTRAVENOUS
  Filled 2011-11-08 (×9): qty 600

## 2011-11-08 MED ORDER — ACETAMINOPHEN 325 MG PO TABS
ORAL_TABLET | ORAL | Status: AC
  Filled 2011-11-08: qty 2

## 2011-11-08 NOTE — Progress Notes (Signed)
Subjective: Still having fevers and chills. Pain slightly less.  Objective: Vital signs in last 24 hours: Filed Vitals:   11/08/11 0616 11/08/11 0630 11/08/11 0755 11/08/11 0845  BP: 95/61  116/71   Pulse: 95  89 90  Temp: 99.4 F (37.4 C)  99.6 F (37.6 C)   TempSrc: Oral  Oral   Resp: 18  18   Height:      Weight:  164.8 kg (363 lb 5.1 oz)    SpO2: 94%  95% 95%   Weight change: -2.9 kg (-6 lb 6.3 oz)  Intake/Output Summary (Last 24 hours) at 11/08/11 1113 Last data filed at 11/08/11 0616  Gross per 24 hour  Intake 1790.17 ml  Output   1625 ml  Net 165.17 ml   General: Appears ill. Lungs clear to auscultation bilaterally without wheezes rhonchi or rales Cardiovascular regular rate rhythm without murmurs gallops rubs Abdomen soft nontender nondistended Extremities left leg slightly less swollen. Some receding of the erythema. Still quite warm and tender.  Lab Results: Basic Metabolic Panel:  Lab 11/07/11 1478 11/07/11 0019  NA 136 135  K 4.0 3.5  CL 103 102  CO2 26 24  GLUCOSE 111* 104*  BUN 11 10  CREATININE 1.03 0.98  CALCIUM 8.7 9.0  MG 1.6 --  PHOS -- --   Liver Function Tests:  Lab 11/07/11 0612  AST 20  ALT 18  ALKPHOS 40  BILITOT 1.5*  PROT 6.6  ALBUMIN 3.2*   No results found for this basename: LIPASE:2,AMYLASE:2 in the last 168 hours No results found for this basename: AMMONIA:2 in the last 168 hours CBC:  Lab 11/08/11 0441 11/07/11 0612  WBC 17.2* 19.2*  NEUTROABS -- --  HGB 11.6* 12.5*  HCT 35.4* 37.2*  MCV 90.5 90.3  PLT 140* 153   Cardiac Enzymes: No results found for this basename: CKTOTAL:3,CKMB:3,CKMBINDEX:3,TROPONINI:3 in the last 168 hours BNP: No results found for this basename: PROBNP:3 in the last 168 hours D-Dimer: No results found for this basename: DDIMER:2 in the last 168 hours CBG: No results found for this basename: GLUCAP:6 in the last 168 hours Hemoglobin A1C: No results found for this basename: HGBA1C in the  last 168 hours Fasting Lipid Panel: No results found for this basename: CHOL,HDL,LDLCALC,TRIG,CHOLHDL,LDLDIRECT in the last 295 hours Thyroid Function Tests: No results found for this basename: TSH,T4TOTAL,FREET4,T3FREE,THYROIDAB in the last 168 hours Coagulation: No results found for this basename: LABPROT:4,INR:4 in the last 168 hours Anemia Panel: No results found for this basename: VITAMINB12,FOLATE,FERRITIN,TIBC,IRON,RETICCTPCT in the last 168 hours Urine Drug Screen: Drugs of Abuse  No results found for this basename: labopia, cocainscrnur, labbenz, amphetmu, thcu, labbarb    Alcohol Level: No results found for this basename: ETH:2 in the last 168 hours Urinalysis:  Lab 11/07/11 0427  COLORURINE YELLOW  LABSPEC >1.030*  PHURINE 5.5  GLUCOSEU NEGATIVE  HGBUR NEGATIVE  BILIRUBINUR NEGATIVE  KETONESUR TRACE*  PROTEINUR TRACE*  UROBILINOGEN 0.2  NITRITE NEGATIVE  LEUKOCYTESUR NEGATIVE   Micro Results: Recent Results (from the past 240 hour(s))  MRSA PCR SCREENING     Status: Normal   Collection Time   11/07/11  4:00 AM      Component Value Range Status Comment   MRSA by PCR NEGATIVE  NEGATIVE Final    Scheduled Meds:   . enoxaparin (LOVENOX) injection  80 mg Subcutaneous Q24H  . sodium chloride  3 mL Intravenous Q12H  . vancomycin  1,500 mg Intravenous Q12H  . DISCONTD: enoxaparin (LOVENOX)  injection  40 mg Subcutaneous Q24H   Continuous Infusions:   . 0.9 % NaCl with KCl 20 mEq / Jensen 10 mL/hr at 11/07/11 1759   PRN Meds:.acetaminophen, acetaminophen, HYDROmorphone (DILAUDID) injection, ondansetron (ZOFRAN) IV, ondansetron, oxyCODONE, traZODone Assessment/Plan: Principal Problem:  *Cellulitis of left leg Active Problems:  Lymphedema  Spinal injury  Obesity  Wound, open, breast  Patient is still febrile. Will change antibiotics to ceftaroline to broaden coverage. Get blood cultures. Await wound care consult. Blood pressure is stable.   LOS: 2 days    Jordan Jensen 11/08/2011, 11:13 AM

## 2011-11-08 NOTE — Progress Notes (Signed)
Report called to Lindaann Slough, RN Dept. 300. Patient transferred to room 303 by NT

## 2011-11-09 DIAGNOSIS — S21009A Unspecified open wound of unspecified breast, initial encounter: Secondary | ICD-10-CM | POA: Diagnosis not present

## 2011-11-09 DIAGNOSIS — L02419 Cutaneous abscess of limb, unspecified: Secondary | ICD-10-CM | POA: Diagnosis not present

## 2011-11-09 DIAGNOSIS — I890 Lymphedema, not elsewhere classified: Secondary | ICD-10-CM | POA: Diagnosis not present

## 2011-11-09 MED ORDER — SODIUM CHLORIDE 0.9 % IJ SOLN
INTRAMUSCULAR | Status: AC
  Filled 2011-11-09: qty 3

## 2011-11-09 MED ORDER — TRAZODONE HCL 50 MG PO TABS
ORAL_TABLET | ORAL | Status: AC
  Filled 2011-11-09: qty 1

## 2011-11-09 NOTE — Consult Note (Signed)
WOC consult Note Reason for Consult:wound beneath right breast.  Discussed with RN Clydie Braun) on 11/08/11.  Patient had a surgical incision and drainage of a cyst by his PCP approximately 2 weeks ago.  Has been seen by same PCP for follow-up and was instructed to keep clean and apply a dry dressing until healed. Wound type:Surgical Pressure Ulcer POA:No Measurement:(Per staff RN):  .5cm x 1.5cm x .2cm  Wound ZOX:WRUEA, pink, healing Drainage (amount, consistency, odor) none Periwound:intact Dressing procedure/placement/frequency:Orders provided for nursing staff to dress daily.  I will recommend saline moistened gauze over dry gauze to enhance epithelial migration and perhaps expedite closure of this surgical wound without complications. I will not follow.  Please re-consult if needed. Thanks, Ladona Mow, MSN, RN, [facility], [facility]OCN [phone])

## 2011-11-09 NOTE — Progress Notes (Signed)
Subjective: Feels a little bit better. Some headache. Leg and foot less painful.  Objective: Vital signs in last 24 hours: Filed Vitals:   11/08/11 1131 11/08/11 2125 11/09/11 0422 11/09/11 0831  BP: 121/69 115/69 99/55   Pulse: 90 87 83 88  Temp: 101.8 F (38.8 C) 99.4 F (37.4 C) 100.6 F (38.1 C)   TempSrc: Oral Oral Oral   Resp: 18 18 18    Height:      Weight:   169.01 kg (372 lb 9.6 oz)   SpO2: 97% 97% 99% 98%   Weight change: 4.21 kg (9 lb 4.5 oz)  Intake/Output Summary (Last 24 hours) at 11/09/11 0837 Last data filed at 11/09/11 0300  Gross per 24 hour  Intake    120 ml  Output   2000 ml  Net  -1880 ml   General: Appears more comfortable today. Lungs clear to auscultation bilaterally without wheezes rhonchi or rales Cardiovascular regular rate rhythm without murmurs gallops rubs Abdomen soft nontender nondistended Extremities left leg and foot continues to improve, but still fairly extensive erythema warmth and tenderness.  Lab Results: Basic Metabolic Panel:  Lab 11/07/11 1610 11/07/11 0019  NA 136 135  K 4.0 3.5  CL 103 102  CO2 26 24  GLUCOSE 111* 104*  BUN 11 10  CREATININE 1.03 0.98  CALCIUM 8.7 9.0  MG 1.6 --  PHOS -- --   Liver Function Tests:  Lab 11/07/11 0612  AST 20  ALT 18  ALKPHOS 40  BILITOT 1.5*  PROT 6.6  ALBUMIN 3.2*   No results found for this basename: LIPASE:2,AMYLASE:2 in the last 168 hours No results found for this basename: AMMONIA:2 in the last 168 hours CBC:  Lab 11/08/11 0441 11/07/11 0612  WBC 17.2* 19.2*  NEUTROABS -- --  HGB 11.6* 12.5*  HCT 35.4* 37.2*  MCV 90.5 90.3  PLT 140* 153   Cardiac Enzymes: No results found for this basename: CKTOTAL:3,CKMB:3,CKMBINDEX:3,TROPONINI:3 in the last 168 hours BNP: No results found for this basename: PROBNP:3 in the last 168 hours D-Dimer: No results found for this basename: DDIMER:2 in the last 168 hours CBG: No results found for this basename: GLUCAP:6 in the last  168 hours Hemoglobin A1C: No results found for this basename: HGBA1C in the last 168 hours Fasting Lipid Panel: No results found for this basename: CHOL,HDL,LDLCALC,TRIG,CHOLHDL,LDLDIRECT in the last 960 hours Thyroid Function Tests: No results found for this basename: TSH,T4TOTAL,FREET4,T3FREE,THYROIDAB in the last 168 hours Coagulation: No results found for this basename: LABPROT:4,INR:4 in the last 168 hours Anemia Panel: No results found for this basename: VITAMINB12,FOLATE,FERRITIN,TIBC,IRON,RETICCTPCT in the last 168 hours Urine Drug Screen: Drugs of Abuse  No results found for this basename: labopia,  cocainscrnur,  labbenz,  amphetmu,  thcu,  labbarb    Alcohol Level: No results found for this basename: ETH:2 in the last 168 hours Urinalysis:  Lab 11/07/11 0427  COLORURINE YELLOW  LABSPEC >1.030*  PHURINE 5.5  GLUCOSEU NEGATIVE  HGBUR NEGATIVE  BILIRUBINUR NEGATIVE  KETONESUR TRACE*  PROTEINUR TRACE*  UROBILINOGEN 0.2  NITRITE NEGATIVE  LEUKOCYTESUR NEGATIVE   Micro Results: Recent Results (from the past 240 hour(s))  MRSA PCR SCREENING     Status: Normal   Collection Time   11/07/11  4:00 AM      Component Value Range Status Comment   MRSA by PCR NEGATIVE  NEGATIVE Final   CULTURE, BLOOD (ROUTINE X 2)     Status: Normal (Preliminary result)   Collection Time  11/08/11 11:36 AM      Component Value Range Status Comment   Specimen Description BLOOD LEFT ARM   Final    Special Requests     Final    Value: BOTTLES DRAWN AEROBIC AND ANAEROBIC 8CC EACH BOTTLE   Culture NO GROWTH 1 DAY   Final    Report Status PENDING   Incomplete   CULTURE, BLOOD (ROUTINE X 2)     Status: Normal (Preliminary result)   Collection Time   11/08/11 11:41 AM      Component Value Range Status Comment   Specimen Description BLOOD LEFT ARM   Final    Special Requests     Final    Value: BOTTLES DRAWN AEROBIC AND ANAEROBIC 8CC EACH BOTTLE   Culture NO GROWTH 1 DAY   Final    Report  Status PENDING   Incomplete    Scheduled Meds:    . acetaminophen      . ceFTAROline (TEFLARO) IV  600 mg Intravenous Q12H  . enoxaparin (LOVENOX) injection  80 mg Subcutaneous Q24H  . sodium chloride  3 mL Intravenous Q12H  . sodium chloride      . traZODone      . [DISCONTINUED] vancomycin  1,500 mg Intravenous Q12H   Continuous Infusions:    . 0.9 % NaCl with KCl 20 mEq / L 20 mL/hr (11/08/11 1620)   PRN Meds:.acetaminophen, acetaminophen, HYDROmorphone (DILAUDID) injection, ondansetron (ZOFRAN) IV, ondansetron, oxyCODONE, traZODone Assessment/Plan: Principal Problem:  *Cellulitis of left leg Active Problems:  Lymphedema  Spinal injury  Obesity  Wound, open, breast  Continue current. Increase activity as tolerated.   LOS: 3 days   Tinslee Klare L 11/09/2011, 8:37 AM

## 2011-11-10 DIAGNOSIS — E669 Obesity, unspecified: Secondary | ICD-10-CM | POA: Diagnosis not present

## 2011-11-10 DIAGNOSIS — I890 Lymphedema, not elsewhere classified: Secondary | ICD-10-CM | POA: Diagnosis not present

## 2011-11-10 DIAGNOSIS — L03119 Cellulitis of unspecified part of limb: Secondary | ICD-10-CM | POA: Diagnosis not present

## 2011-11-10 DIAGNOSIS — S21009A Unspecified open wound of unspecified breast, initial encounter: Secondary | ICD-10-CM | POA: Diagnosis not present

## 2011-11-10 DIAGNOSIS — L02419 Cutaneous abscess of limb, unspecified: Secondary | ICD-10-CM | POA: Diagnosis not present

## 2011-11-10 MED ORDER — SODIUM CHLORIDE 0.9 % IJ SOLN
INTRAMUSCULAR | Status: AC
  Administered 2011-11-10: 3 mL
  Filled 2011-11-10: qty 3

## 2011-11-10 NOTE — Progress Notes (Signed)
Subjective: Feels a little bit better. Some headache. Leg and foot less painful.  Objective: Vital signs in last 24 hours: Filed Vitals:   11/09/11 0831 11/09/11 1425 11/09/11 2040 11/10/11 0414  BP:  110/74 133/66 128/77  Pulse: 88 90 89 82  Temp:  99.1 F (37.3 C) 98.9 F (37.2 C) 98.5 F (36.9 C)  TempSrc:  Oral Oral Oral  Resp:  18 18 18   Height:      Weight:    169 kg (372 lb 9.2 oz)  SpO2: 98% 97% 95% 96%   Weight change: -0.01 kg (-0.4 oz)  Intake/Output Summary (Last 24 hours) at 11/10/11 1031 Last data filed at 11/10/11 0924  Gross per 24 hour  Intake    733 ml  Output    600 ml  Net    133 ml   General: Nontoxic Lungs clear to auscultation bilaterally without wheezes rhonchi or rales Cardiovascular regular rate rhythm without murmurs gallops rubs Abdomen soft nontender nondistended Extremities Edema, warmth, erythema much improved. Cellulitic area has receded to about the mid calf/pretibial area. Much less warmth. Erythema much lighter. Swelling much improved.  Lab Results: Basic Metabolic Panel:  Lab 11/07/11 1610 11/07/11 0019  NA 136 135  K 4.0 3.5  CL 103 102  CO2 26 24  GLUCOSE 111* 104*  BUN 11 10  CREATININE 1.03 0.98  CALCIUM 8.7 9.0  MG 1.6 --  PHOS -- --   Liver Function Tests:  Lab 11/07/11 0612  AST 20  ALT 18  ALKPHOS 40  BILITOT 1.5*  PROT 6.6  ALBUMIN 3.2*   No results found for this basename: LIPASE:2,AMYLASE:2 in the last 168 hours No results found for this basename: AMMONIA:2 in the last 168 hours CBC:  Lab 11/08/11 0441 11/07/11 0612  WBC 17.2* 19.2*  NEUTROABS -- --  HGB 11.6* 12.5*  HCT 35.4* 37.2*  MCV 90.5 90.3  PLT 140* 153   Cardiac Enzymes: No results found for this basename: CKTOTAL:3,CKMB:3,CKMBINDEX:3,TROPONINI:3 in the last 168 hours BNP: No results found for this basename: PROBNP:3 in the last 168 hours D-Dimer: No results found for this basename: DDIMER:2 in the last 168 hours CBG: No results found  for this basename: GLUCAP:6 in the last 168 hours Hemoglobin A1C: No results found for this basename: HGBA1C in the last 168 hours Fasting Lipid Panel: No results found for this basename: CHOL,HDL,LDLCALC,TRIG,CHOLHDL,LDLDIRECT in the last 960 hours Thyroid Function Tests: No results found for this basename: TSH,T4TOTAL,FREET4,T3FREE,THYROIDAB in the last 168 hours Coagulation: No results found for this basename: LABPROT:4,INR:4 in the last 168 hours Anemia Panel: No results found for this basename: VITAMINB12,FOLATE,FERRITIN,TIBC,IRON,RETICCTPCT in the last 168 hours Urine Drug Screen: Drugs of Abuse  No results found for this basename: labopia,  cocainscrnur,  labbenz,  amphetmu,  thcu,  labbarb    Alcohol Level: No results found for this basename: ETH:2 in the last 168 hours Urinalysis:  Lab 11/07/11 0427  COLORURINE YELLOW  LABSPEC >1.030*  PHURINE 5.5  GLUCOSEU NEGATIVE  HGBUR NEGATIVE  BILIRUBINUR NEGATIVE  KETONESUR TRACE*  PROTEINUR TRACE*  UROBILINOGEN 0.2  NITRITE NEGATIVE  LEUKOCYTESUR NEGATIVE   Micro Results: Recent Results (from the past 240 hour(s))  MRSA PCR SCREENING     Status: Normal   Collection Time   11/07/11  4:00 AM      Component Value Range Status Comment   MRSA by PCR NEGATIVE  NEGATIVE Final   CULTURE, BLOOD (ROUTINE X 2)     Status: Normal (  Preliminary result)   Collection Time   11/08/11 11:36 AM      Component Value Range Status Comment   Specimen Description BLOOD LEFT ARM   Final    Special Requests     Final    Value: BOTTLES DRAWN AEROBIC AND ANAEROBIC 8CC EACH BOTTLE   Culture NO GROWTH 1 DAY   Final    Report Status PENDING   Incomplete   CULTURE, BLOOD (ROUTINE X 2)     Status: Normal (Preliminary result)   Collection Time   11/08/11 11:41 AM      Component Value Range Status Comment   Specimen Description BLOOD LEFT ARM   Final    Special Requests     Final    Value: BOTTLES DRAWN AEROBIC AND ANAEROBIC 8CC EACH BOTTLE    Culture NO GROWTH 1 DAY   Final    Report Status PENDING   Incomplete    Scheduled Meds:    . ceFTAROline (TEFLARO) IV  600 mg Intravenous Q12H  . enoxaparin (LOVENOX) injection  80 mg Subcutaneous Q24H  . sodium chloride  3 mL Intravenous Q12H  . [COMPLETED] sodium chloride      . [EXPIRED] sodium chloride      . sodium chloride       Continuous Infusions:   PRN Meds:.acetaminophen, acetaminophen, HYDROmorphone (DILAUDID) injection, ondansetron (ZOFRAN) IV, ondansetron, oxyCODONE, traZODone Assessment/Plan: Principal Problem:  *Cellulitis of left leg Active Problems:  Lymphedema  Spinal injury  Obesity  Wound, open, breast, I&D of cyst prior to admission.  Slowly improving on ceftaroline. Will benefit from another day or 2 of IV antibiotics. Outpatient lymphedema therapy has a ready been arranged prior to admission.   LOS: 4 days   Laymond Postle L 11/10/2011, 10:31 AM
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
## 2011-11-10 NOTE — Progress Notes (Signed)
Physical Therapy Treatment Patient Details Name: Jordan Jensen MRN: [id number] DOB: 07-Jan-1970 Today's Date: 11/10/2011 Time: 4098-1191 PT Time Calculation (min): 32 min  PT Assessment / Plan / Recommendation Comments on Treatment Session  Edema in LLE appears to be improved, but has no pain.  He continues to have significant difficulty moving both LEs in the bed due to lymphedema and general deconditioning.      Follow Up Recommendations        Does the patient have the potential to tolerate intense rehabilitation     Barriers to Discharge        Equipment Recommendations       Recommendations for Other Services    Frequency     Plan Discharge plan remains appropriate;Frequency remains appropriate    Precautions / Restrictions     Pertinent Vitals/Pain     Mobility  Bed Mobility Details for Bed Mobility Assistance: pt declines being up, OOB..."I just don't feel like it right now".Marland KitchenMarland KitchenPt advised as to how important increased activity is, tried to get him to change his mind, but no go.    Exercises General Exercises - Lower Extremity Ankle Circles/Pumps: AROM;Both;10 reps;Supine Quad Sets: AROM;Both;10 reps;Supine Gluteal Sets: AROM;Both;10 reps;Supine Short Arc Quad: AROM;Both;15 reps;Supine Heel Slides: AAROM;Both;10 reps;Supine Hip ABduction/ADduction: AAROM;Both;10 reps;Supine   PT Diagnosis:    PT Problem List:   PT Treatment Interventions:     PT Goals Acute Rehab PT Goals PT Goal: Supine/Side to Sit - Progress: Not progressing PT Goal: Ambulate - Progress: Not progressing  Visit Information  Last PT Received On: 11/10/11    Subjective Data  Subjective: I took a shower yesterday   Cognition       Balance     End of Session PT - End of Session Activity Tolerance: Patient tolerated treatment well Patient left: in bed;with call bell/phone within reach Nurse Communication: Mobility status   GP     Myrlene Broker L 11/10/2011, 9:21 AM

## 2011-11-10 NOTE — Plan of Care (Signed)
Problem: Phase I Progression Outcomes Goal: OOB as tolerated unless otherwise ordered Outcome: Completed/Met Date Met:  11/10/11 11/10/11 1614 Up in room with assist, states uses crutches for ambulation. Up in room for shower today with nurse tech supervision.

## 2011-11-11 DIAGNOSIS — R609 Edema, unspecified: Secondary | ICD-10-CM

## 2011-11-11 DIAGNOSIS — I10 Essential (primary) hypertension: Secondary | ICD-10-CM

## 2011-11-11 DIAGNOSIS — L03119 Cellulitis of unspecified part of limb: Secondary | ICD-10-CM | POA: Diagnosis not present

## 2011-11-11 DIAGNOSIS — L02419 Cutaneous abscess of limb, unspecified: Secondary | ICD-10-CM | POA: Diagnosis not present

## 2011-11-11 DIAGNOSIS — I890 Lymphedema, not elsewhere classified: Secondary | ICD-10-CM | POA: Diagnosis not present

## 2011-11-11 LAB — BASIC METABOLIC PANEL
Calcium: 8.9 mg/dL (ref 8.4–10.5)
GFR calc Af Amer: >90 mL/min (ref >90)
GFR calc non Af Amer: >90 mL/min (ref >90)
Glucose, Bld: 89 mg/dL (ref 70–99)
Sodium: 137 mEq/L (ref 135–145)

## 2011-11-11 LAB — CBC
MCH: 29.4 pg (ref 26.0–34.0)
Platelets: 208 10*3/uL (ref 150–400)
RBC: 4.02 MIL/uL — ABNORMAL LOW (ref 4.22–5.81)

## 2011-11-11 NOTE — Progress Notes (Signed)
     Subjective: This man's left leg cellulitis is clearly improving, per his own admission. He has had no fever.           Physical Exam: Blood pressure 111/70, pulse 81, temperature 98 F (36.7 C), temperature source Oral, resp. rate 20, height 5\' 11"  (1.803 m), weight 160.664 kg (354 lb 3.2 oz), SpO2 95.00%. He looks systemically well. Is not toxic or septic. Left leg cellulitis is present but does not appear to be severe as described previously. Lung fields are clear. Heart sounds are normal and present. He is alert and oriented.   Investigations:  Recent Results (from the past 240 hour(s))  MRSA PCR SCREENING     Status: Normal   Collection Time   11/07/11  4:00 AM      Component Value Range Status Comment   MRSA by PCR NEGATIVE  NEGATIVE Final   CULTURE, BLOOD (ROUTINE X 2)     Status: Normal (Preliminary result)   Collection Time   11/08/11 11:36 AM      Component Value Range Status Comment   Specimen Description BLOOD LEFT ARM   Final    Special Requests     Final    Value: BOTTLES DRAWN AEROBIC AND ANAEROBIC 8CC EACH BOTTLE   Culture NO GROWTH 1 DAY   Final    Report Status PENDING   Incomplete   CULTURE, BLOOD (ROUTINE X 2)     Status: Normal (Preliminary result)   Collection Time   11/08/11 11:41 AM      Component Value Range Status Comment   Specimen Description BLOOD LEFT ARM   Final    Special Requests     Final    Value: BOTTLES DRAWN AEROBIC AND ANAEROBIC 8CC EACH BOTTLE   Culture NO GROWTH 1 DAY   Final    Report Status PENDING   Incomplete      Basic Metabolic Panel:  Basename 11/11/11 0452  NA 137  K 3.7  CL 102  CO2 27  GLUCOSE 89  BUN 8  CREATININE 0.96  CALCIUM 8.9  MG --  PHOS --       CBC:  Basename 11/11/11 0452  WBC 10.0  NEUTROABS --  HGB 11.8*  HCT 36.2*  MCV 90.0  PLT 208        Medications: I have reviewed the patient's current medications.  Impression: 1. Left leg cellulitis, improving. White blood cell  count now normal. 2. Spinal cord injury. 3. Morbid obesity. 4. Lymphedema, chronic. 5. Wound beneath the right breast, healing.     Plan: 1. Continue with intravenous antibiotics for today. 2. Possible discharge home tomorrow.     LOS: 5 days   Wilson Singer Pager [phone]  11/11/2011, 8:08 AM

## 2011-11-11 NOTE — Progress Notes (Signed)
Physical Therapy Treatment Patient Details Name: Jordan Jensen MRN: [id number] DOB: 1970/09/24 Today's Date: 11/11/2011 Time: 6578-4696 PT Time Calculation (min): 24 min  PT Assessment / Plan / Recommendation Comments on Treatment Session  Pt requires max cueing for R knee flexion due to increased stiffness.  However L LE remains much larger than RLE.  Continues to ambulate with crutches heightened and is able to perform with min guard.      Follow Up Recommendations        Does the patient have the potential to tolerate intense rehabilitation     Barriers to Discharge        Equipment Recommendations       Recommendations for Other Services    Frequency     Plan Discharge plan remains appropriate;Frequency remains appropriate    Precautions / Restrictions     Pertinent Vitals/Pain None    Mobility  Bed Mobility Sit to Supine: 4: Min assist Details for Bed Mobility Assistance: A at end range of bed mobility w/HHA only Transfers Sit to Stand: 5: Supervision;From elevated surface;From bed Stand to Sit: 5: Supervision;With armrests (Recliner) Ambulation/Gait Ambulation/Gait Assistance: 4: Min guard Ambulation Distance (Feet): 46 Feet Assistive device: Crutches    Exercises Total Joint Exercises Ankle Circles/Pumps: AROM;Both;10 reps Long Arc Quad: AROM;Both;10 reps;Seated Marching in Standing: Seated;10 reps;Both   PT Diagnosis:    PT Problem List:   PT Treatment Interventions:     PT Goals Acute Rehab PT Goals PT Goal Formulation: With patient Time For Goal Achievement: 11/14/11 Potential to Achieve Goals: Good Pt will go Supine/Side to Sit: with supervision;with HOB not 0 degrees (comment degree) PT Goal: Supine/Side to Sit - Progress: Progressing toward goal Pt will Ambulate: 51 - 150 feet;with supervision;with crutches PT Goal: Ambulate - Progress: Progressing toward goal  Visit Information  Last PT Received On: 11/11/11    Subjective Data  Subjective:  I've been moving around pretty well today.  I went into the shower and I was feeling good.    Cognition       Balance     End of Session PT - End of Session Equipment Utilized During Treatment: Gait belt Activity Tolerance: Patient tolerated treatment well Patient left: in chair;with call bell/phone within reach   GP     Acie Custis, PT 11/11/2011, 4:32 PM

## 2011-11-12 DIAGNOSIS — I10 Essential (primary) hypertension: Secondary | ICD-10-CM | POA: Diagnosis not present

## 2011-11-12 DIAGNOSIS — R609 Edema, unspecified: Secondary | ICD-10-CM | POA: Diagnosis not present

## 2011-11-12 DIAGNOSIS — L02419 Cutaneous abscess of limb, unspecified: Secondary | ICD-10-CM | POA: Diagnosis not present

## 2011-11-12 MED ORDER — OXYCODONE HCL 5 MG PO TABS: 5.0000 mg | ORAL_TABLET | ORAL | Status: DC | PRN

## 2011-11-12 MED ORDER — LEVOFLOXACIN 500 MG PO TABS: 500.0000 mg | ORAL_TABLET | Freq: Every day | ORAL | Status: AC
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
## 2011-11-12 NOTE — Progress Notes (Signed)
Patient received discharge instructions along with follow up appointments and prescriptions. Patient verbalized understanding of all instructions. Patient was escorted by via wheelchair to vehicle. Patient discharged to home in stable condition.

## 2011-11-13 LAB — CULTURE, BLOOD (ROUTINE X 2)

## 2011-11-21 ENCOUNTER — Inpatient Hospital Stay: Payer: Medicare Other | Admitting: Family Medicine

## 2011-11-25 ENCOUNTER — Ambulatory Visit (INDEPENDENT_AMBULATORY_CARE_PROVIDER_SITE_OTHER): Payer: Medicare Other | Admitting: Family Medicine

## 2011-11-25 VITALS — BP 100/70 | HR 80 | Wt 338.0 lb

## 2011-11-25 DIAGNOSIS — E669 Obesity, unspecified: Secondary | ICD-10-CM | POA: Diagnosis not present

## 2011-11-25 DIAGNOSIS — L02419 Cutaneous abscess of limb, unspecified: Secondary | ICD-10-CM

## 2011-11-25 DIAGNOSIS — R600 Localized edema: Secondary | ICD-10-CM

## 2011-11-25 DIAGNOSIS — R0609 Other forms of dyspnea: Secondary | ICD-10-CM | POA: Diagnosis not present

## 2011-11-25 DIAGNOSIS — Z9889 Other specified postprocedural states: Secondary | ICD-10-CM

## 2011-11-25 DIAGNOSIS — I890 Lymphedema, not elsewhere classified: Secondary | ICD-10-CM | POA: Diagnosis not present

## 2011-11-25 DIAGNOSIS — L03116 Cellulitis of left lower limb: Secondary | ICD-10-CM

## 2011-11-25 DIAGNOSIS — R609 Edema, unspecified: Secondary | ICD-10-CM

## 2011-11-25 DIAGNOSIS — R0989 Other specified symptoms and signs involving the circulatory and respiratory systems: Secondary | ICD-10-CM

## 2011-11-25 DIAGNOSIS — S21009A Unspecified open wound of unspecified breast, initial encounter: Secondary | ICD-10-CM

## 2011-11-25 DIAGNOSIS — R0683 Snoring: Secondary | ICD-10-CM

## 2011-11-25 DIAGNOSIS — I10 Essential (primary) hypertension: Secondary | ICD-10-CM

## 2011-11-25 DIAGNOSIS — L03119 Cellulitis of unspecified part of limb: Secondary | ICD-10-CM

## 2011-11-25 MED ORDER — LIDOCAINE HCL 2 % IJ SOLN: 10.0000 mL | Freq: Once | INTRAMUSCULAR | Status: DC

## 2011-11-25 NOTE — Assessment & Plan Note (Addendum)
>>  ASSESSMENT AND PLAN FOR LYMPHEDEMA WRITTEN ON 11/25/2011  5:52 PM BY [place] Inthavong J, MD  Improving w/ elevation, PT at lymphedema clinic, suction hose Will Rx compression hose and given info to look into [email or web address]  >>ASSESSMENT AND PLAN FOR BILATERAL LEG EDEMA WRITTEN ON 11/25/2011  2:36 PM BY Maddock Finigan J, MD  Pt to obtain stockings from [email or web address] Will also order compression hose  Pt go to PT  Continue elevation and regular lasix

## 2011-11-25 NOTE — Assessment & Plan Note (Signed)
No evidence of return since DC from hospital

## 2011-11-25 NOTE — Assessment & Plan Note (Signed)
Likely resolution due to OSA and wt loss Pt to use lasix for edema only. And cautioned about hypotension

## 2011-11-25 NOTE — Assessment & Plan Note (Signed)
Pt to obtain stockings from [email or web address] Will also order compression hose  Pt go to PT  Continue elevation and regular lasix

## 2011-11-25 NOTE — Assessment & Plan Note (Signed)
Open wound today w/ epidermoid cystic sack protrusion out of previous abscess site.  Area debreded and sack excised.  Wound care instructions given If area continues to weep or returns as mass, will need complete excision

## 2011-11-25 NOTE — Assessment & Plan Note (Signed)
Nearly resolved per pt girlfriend Pt is likely reversing his OSA through wt loss

## 2011-11-25 NOTE — Patient Instructions (Addendum)
Please continue to work with physical therapy Elevate your legs at night adn during the day as much as possible  Please go to [email or web address] for the compression hose (buy the "heavy" type) Please also try filling the prescription fo rthe compression hose if necessary Please let me know if your legs become infected again. Please meet with Dr Gerilyn Pilgrim. You are doing awesome [employer]  Lymphedema Lymphedema is a swelling caused by the abnormal collection of lymph under the skin. The lymph is fluid from the tissues in your body that travels in the lymphatic system. This system is part of the immune system that includes lymph nodes and vessels. The lymph vessels collect and carry the excess fluid, fats, proteins, and wastes from the tissues of the body to the bloodstream. This system also works to clean and remove bacteria and waste products from the body.  Lymphedema occurs when the lymphatic system is blocked. When the lymph vessels or lymph nodes are blocked or damaged, lymph does not drain properly. This causes abnormal build up of lymph. This leads to swelling in the arms or legs. Lymphedema cannot be cured by medicines. But the swelling can be reduced by physical methods. CAUSES  There are two types of Lymphedema. Primary lymphedema is caused by the absence or abnormality of the lymph vessel at birth. It is also known as inherited lymphedema, which occurs rarely. Secondary or acquired lymphedema occurs when the lymph vessel is damaged or blocked. The causes of lymph vessel blockage are:   Skin infection like cellulites.  Infection by parasites (filariasis).  Injury.  Cancer.  Radiation therapy.  Formation of scar tissue.  Surgery. SYMPTOMS  The symptoms of lymphedema are:  Abnormal swelling of the arm or leg.  Heavy or tight feeling in your arm or leg.  Tight-fitting shoes or rings.  Redness of skin over the affected area.  Limited movement of the affected limb.  Some  patients complain about sensitivity to touch and discomfort in the limb(s) affected. You may not have these symptoms immediately following injury. They usually appear within a few days or even years after injury. Inform your caregiver, if you have any of these symptoms. Early treatment can avoid further problems.  DIAGNOSIS  First, your caregiver will inquire about any surgery you have had or medicines you are taking. He will then examine you. Your caregiver may order special imaging tests, such as:  Lymphoscintigraphy (a test in which a low dose of radioactive substance is injected to trace the flow of lymph through the lymph vessels).  MRI (imaging tests using sound waves).  Computed tomography (test using special cross-sectional X-rays).  Duplex ultrasound (test using high-frequency sound waves to show the vessels and the blood flow on a screen).  Lymphangiography (special X-ray taken after injecting a contrast dye into the lymph vessel). It is now rarely done. TREATMENT  Lymphedema can be treated in different ways. Your caregiver will decide the type of treatment depending on the cause. Treatment may include:  Exercise: Special exercises will help fluid move out easily from the affected part. This should be done as per your caregiver's advice.  Manual lymph drainage: Gentle massage of the affected limb makes the fluid to move out more freely.  Compression: Compression stockings or external pump apply pressure over the affected limb. This helps the fluid to move out from the arm or leg. Bandaging can also help to move the fluid out from the affected part. Your caregiver will decide the method that  suits you the most.   Medicines: Your caregiver may prescribe antibiotics, if you have infection.  Surgery: Your caregiver may advise surgery for severe lymphedema. It is reserved for special cases when the patient has difficulty moving. Your surgeon may remove excess tissue from the arm or  leg. This will help to ease your movement. Physical therapy may have to be continued after surgery. HOME CARE INSTRUCTIONS   Eat a healthy diet.  Exercise regularly as per advice.  Keep the affected area clean and dry.  Use gloves while cooking or gardening.  Protect your skin from cuts.  Use electric razor to shave the affected area.  Keep affected limb elevated.  Do not wear tight clothes, shoes, or jewels as it may cause the tissue to be strangled.  Do not use heat pads over the affected area.  Do not sit with cross legs.  Do not walk barefoot.  Do not carry weight on the affected arm.  Avoid having blood pressure checked on the affected limb.  The area is very fragile and is predisposed to injury and infection. SEEK MEDICAL CARE IF:  You continue to have swelling in your limb. SEEK IMMEDIATE MEDICAL CARE IF:   You have high fever.  You have skin rash.  You have chills or sweats.  You have pain or redness.  You have a cut that does not heal. MAKE SURE YOU:   Understand these instructions.  Will watch your condition.  Will get help right away if you are not doing well or get worse. Document Released: 10/20/2006 Document Revised: 03/17/2011 Document Reviewed: 09/25/2008 [facility] Patient Information 2013 [place], [place].

## 2011-11-25 NOTE — Progress Notes (Signed)
Sebaceous Cyst Excision Procedure Note  Pre-operative Diagnosis: epidermoid cyst prolapse through previous I&D site of abscess  Post-operative Diagnosis: same  Locations:inferior R breast  Indications: persistent weeping and recent infection  Anesthesia: Lidocaine 1% with epinephrine without added sodium bicarbonate  Procedure Details  History of allergy to iodine: no  Patient informed of the risks (including bleeding and infection) and benefits of the  procedure and Verbal informed consent obtained.  The lesion and surrounding area was given a sterile prep using betadyne and alcohol. The mass ws excised using a #15 blade. The wound was left open and was treated with silver nitrate for hemostasis. The area was covered with a folded 4x4 gauze. No specimen sent for pathology. The patient tolerated the procedure well.  EBL: minimal  Condition: Stable  Complications: none.  Plan: 1. Instructed to keep the wound dry and covered for 24-48h and clean thereafter. 2. Warning signs of infection were reviewed.  Pt to apply triple ABX ointment to the area  3. Recommended that the patient use Percocet as needed for pain. (Pt already w/ Rx at home from previous procedure) 4. Return for suture removal in as needed .
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
## 2011-11-25 NOTE — Assessment & Plan Note (Signed)
Continue current regimen. Meet w/ Dr. Gerilyn Pilgrim for further dietary coaching

## 2011-11-27 ENCOUNTER — Telehealth: Payer: Self-pay | Admitting: Home Health Services

## 2011-11-27 NOTE — Telephone Encounter (Signed)
Spoke with Jordan Jensen.  Pt reports feeling good. Has not had any problems with legs.  Pt reports continuing with diet, but is bored with food choices.  Has not called dietician at this point.  Pt reports girlfriend and mother as big supporters of his dietary changes.   Pt reports working out at home/gym at least 3 times a week.   Pt waiting to hear back from PT is he qualifies for sessions.  Pt set goal to continue with dietary changes and to exercise at least 3 times this next week.  Pt's overall goal is weight loss.  At this point patient has lost 67 lbs.

## 2011-12-03 ENCOUNTER — Telehealth: Payer: Self-pay | Admitting: Home Health Services

## 2011-12-03 NOTE — Telephone Encounter (Signed)
Spoke with Dougles.  Pt reports feeling well.  Reports having success this past week with maintaining diet, not fried foods, increased fruits and vegetables.  Pt reports being able to wear shoes again because swelling in feet has gone down.  Pt reports going to gym 2x this past week and exercised 1x at home.  Pt reports boredom with current diet options, we talked about finding recipes online for variety.   Pt set goal to continue with dietary changes and to exercise at least 3x this next week.   Pt's overall goal is weight loss.

## 2011-12-07 NOTE — Telephone Encounter (Signed)
No f/u documentation needed from me this time... right?

## 2011-12-08 NOTE — Telephone Encounter (Signed)
No documentation required for phone call.  I route it to you as an FYI.

## 2011-12-10 ENCOUNTER — Telehealth: Payer: Self-pay | Admitting: Family Medicine

## 2011-12-10 NOTE — Telephone Encounter (Signed)
Referral refax. Jordan Jensen, Maryjo Rochester

## 2011-12-10 NOTE — Telephone Encounter (Signed)
Pt wasn't able to go to his PT appt because he was in hospital - needs for [place] to refax referral to PT - [facility]

## 2011-12-11 ENCOUNTER — Telehealth: Payer: Self-pay | Admitting: Home Health Services

## 2011-12-11 NOTE — Telephone Encounter (Signed)
Spoke with Kire.  Pt reports feeling well.  Legs have not been swelling very much.  Pt reports being able to continue with dietary changes over Thanksgiving holiday.  He reports not exercising at all this past week due to being busy with family.  Pt set goal to exercise 2 x this next week at gym.  Pt will also continue with dietary changes.    Pt's overall goal is weight loss.

## 2011-12-17 ENCOUNTER — Telehealth: Payer: Self-pay | Admitting: Family Medicine

## 2011-12-17 DIAGNOSIS — M6281 Muscle weakness (generalized): Secondary | ICD-10-CM | POA: Diagnosis not present

## 2011-12-17 DIAGNOSIS — I890 Lymphedema, not elsewhere classified: Secondary | ICD-10-CM | POA: Diagnosis not present

## 2011-12-17 DIAGNOSIS — Z5189 Encounter for other specified aftercare: Secondary | ICD-10-CM | POA: Diagnosis not present

## 2011-12-17 DIAGNOSIS — Z87828 Personal history of other (healed) physical injury and trauma: Secondary | ICD-10-CM | POA: Diagnosis not present

## 2011-12-17 NOTE — Telephone Encounter (Signed)
Patient is calling because he was given an Rx for [facility], but he just needs the Rx to be written for Compression Hose (Knee Highs), this way, he can have them custom made.  He would like to speak to the nurse.

## 2011-12-17 NOTE — Telephone Encounter (Signed)
Not sure why pt wanted a callback from nurse.  Attemtped to call but no answer.  Will forward to MD for Rx. Fleeger, Maryjo Rochester

## 2011-12-18 NOTE — Telephone Encounter (Signed)
Mailed RX. Jordan Jensen, Jordan Jensen

## 2011-12-18 NOTE — Telephone Encounter (Signed)
Will drop off prior to going home today Post call today Please let pt know

## 2011-12-18 NOTE — Telephone Encounter (Signed)
Pt informed that as soon as Dr. Konrad Dolores drops the rx off to me I will mail it to him (per pts request). Fleeger, Maryjo Rochester

## 2011-12-30 DIAGNOSIS — Z87828 Personal history of other (healed) physical injury and trauma: Secondary | ICD-10-CM | POA: Diagnosis not present

## 2011-12-30 DIAGNOSIS — M6281 Muscle weakness (generalized): Secondary | ICD-10-CM | POA: Diagnosis not present

## 2011-12-30 DIAGNOSIS — Z5189 Encounter for other specified aftercare: Secondary | ICD-10-CM | POA: Diagnosis not present

## 2011-12-30 DIAGNOSIS — I890 Lymphedema, not elsewhere classified: Secondary | ICD-10-CM | POA: Diagnosis not present

## 2012-01-02 DIAGNOSIS — I890 Lymphedema, not elsewhere classified: Secondary | ICD-10-CM | POA: Diagnosis not present

## 2012-01-02 DIAGNOSIS — M6281 Muscle weakness (generalized): Secondary | ICD-10-CM | POA: Diagnosis not present

## 2012-01-02 DIAGNOSIS — Z5189 Encounter for other specified aftercare: Secondary | ICD-10-CM | POA: Diagnosis not present

## 2012-01-02 DIAGNOSIS — Z87828 Personal history of other (healed) physical injury and trauma: Secondary | ICD-10-CM | POA: Diagnosis not present

## 2012-01-06 DIAGNOSIS — Z5189 Encounter for other specified aftercare: Secondary | ICD-10-CM | POA: Diagnosis not present

## 2012-01-06 DIAGNOSIS — M6281 Muscle weakness (generalized): Secondary | ICD-10-CM | POA: Diagnosis not present

## 2012-01-06 DIAGNOSIS — I890 Lymphedema, not elsewhere classified: Secondary | ICD-10-CM | POA: Diagnosis not present

## 2012-01-06 DIAGNOSIS — Z87828 Personal history of other (healed) physical injury and trauma: Secondary | ICD-10-CM | POA: Diagnosis not present

## 2012-01-14 DIAGNOSIS — I890 Lymphedema, not elsewhere classified: Secondary | ICD-10-CM | POA: Diagnosis not present

## 2012-01-14 DIAGNOSIS — Z5189 Encounter for other specified aftercare: Secondary | ICD-10-CM | POA: Diagnosis not present

## 2012-01-16 DIAGNOSIS — I890 Lymphedema, not elsewhere classified: Secondary | ICD-10-CM | POA: Diagnosis not present

## 2012-01-16 DIAGNOSIS — Z5189 Encounter for other specified aftercare: Secondary | ICD-10-CM | POA: Diagnosis not present

## 2012-01-21 DIAGNOSIS — I890 Lymphedema, not elsewhere classified: Secondary | ICD-10-CM | POA: Diagnosis not present

## 2012-01-21 DIAGNOSIS — Z5189 Encounter for other specified aftercare: Secondary | ICD-10-CM | POA: Diagnosis not present

## 2012-01-23 DIAGNOSIS — Z5189 Encounter for other specified aftercare: Secondary | ICD-10-CM | POA: Diagnosis not present

## 2012-01-23 DIAGNOSIS — I890 Lymphedema, not elsewhere classified: Secondary | ICD-10-CM | POA: Diagnosis not present

## 2012-01-26 DIAGNOSIS — I890 Lymphedema, not elsewhere classified: Secondary | ICD-10-CM | POA: Diagnosis not present

## 2012-01-26 DIAGNOSIS — Z5189 Encounter for other specified aftercare: Secondary | ICD-10-CM | POA: Diagnosis not present

## 2012-01-28 DIAGNOSIS — Z5189 Encounter for other specified aftercare: Secondary | ICD-10-CM | POA: Diagnosis not present

## 2012-01-28 DIAGNOSIS — I890 Lymphedema, not elsewhere classified: Secondary | ICD-10-CM | POA: Diagnosis not present

## 2012-01-29 DIAGNOSIS — H5210 Myopia, unspecified eye: Secondary | ICD-10-CM | POA: Diagnosis not present

## 2012-01-29 DIAGNOSIS — H40019 Open angle with borderline findings, low risk, unspecified eye: Secondary | ICD-10-CM | POA: Diagnosis not present

## 2012-01-29 DIAGNOSIS — H52229 Regular astigmatism, unspecified eye: Secondary | ICD-10-CM | POA: Diagnosis not present

## 2012-01-30 DIAGNOSIS — Z5189 Encounter for other specified aftercare: Secondary | ICD-10-CM | POA: Diagnosis not present

## 2012-01-30 DIAGNOSIS — I890 Lymphedema, not elsewhere classified: Secondary | ICD-10-CM | POA: Diagnosis not present

## 2012-02-03 DIAGNOSIS — I890 Lymphedema, not elsewhere classified: Secondary | ICD-10-CM | POA: Diagnosis not present

## 2012-02-03 DIAGNOSIS — Z5189 Encounter for other specified aftercare: Secondary | ICD-10-CM | POA: Diagnosis not present

## 2012-02-06 DIAGNOSIS — Z5189 Encounter for other specified aftercare: Secondary | ICD-10-CM | POA: Diagnosis not present

## 2012-02-06 DIAGNOSIS — I890 Lymphedema, not elsewhere classified: Secondary | ICD-10-CM | POA: Diagnosis not present

## 2012-02-10 DIAGNOSIS — R269 Unspecified abnormalities of gait and mobility: Secondary | ICD-10-CM | POA: Diagnosis not present

## 2012-02-10 DIAGNOSIS — I890 Lymphedema, not elsewhere classified: Secondary | ICD-10-CM | POA: Diagnosis not present

## 2012-02-10 DIAGNOSIS — M6281 Muscle weakness (generalized): Secondary | ICD-10-CM | POA: Diagnosis not present

## 2012-02-10 DIAGNOSIS — R262 Difficulty in walking, not elsewhere classified: Secondary | ICD-10-CM | POA: Diagnosis not present

## 2012-02-10 DIAGNOSIS — Z5189 Encounter for other specified aftercare: Secondary | ICD-10-CM | POA: Diagnosis not present

## 2012-02-11 DIAGNOSIS — Z5189 Encounter for other specified aftercare: Secondary | ICD-10-CM | POA: Diagnosis not present

## 2012-02-11 DIAGNOSIS — M6281 Muscle weakness (generalized): Secondary | ICD-10-CM | POA: Diagnosis not present

## 2012-02-11 DIAGNOSIS — I890 Lymphedema, not elsewhere classified: Secondary | ICD-10-CM | POA: Diagnosis not present

## 2012-02-11 DIAGNOSIS — R262 Difficulty in walking, not elsewhere classified: Secondary | ICD-10-CM | POA: Diagnosis not present

## 2012-02-11 DIAGNOSIS — R269 Unspecified abnormalities of gait and mobility: Secondary | ICD-10-CM | POA: Diagnosis not present

## 2012-02-13 DIAGNOSIS — R269 Unspecified abnormalities of gait and mobility: Secondary | ICD-10-CM | POA: Diagnosis not present

## 2012-02-13 DIAGNOSIS — M6281 Muscle weakness (generalized): Secondary | ICD-10-CM | POA: Diagnosis not present

## 2012-02-13 DIAGNOSIS — I890 Lymphedema, not elsewhere classified: Secondary | ICD-10-CM | POA: Diagnosis not present

## 2012-02-13 DIAGNOSIS — Z5189 Encounter for other specified aftercare: Secondary | ICD-10-CM | POA: Diagnosis not present

## 2012-02-13 DIAGNOSIS — R262 Difficulty in walking, not elsewhere classified: Secondary | ICD-10-CM | POA: Diagnosis not present

## 2012-02-16 ENCOUNTER — Inpatient Hospital Stay (HOSPITAL_COMMUNITY): Payer: Medicare Other

## 2012-02-16 ENCOUNTER — Emergency Department (HOSPITAL_COMMUNITY): Payer: Medicare Other

## 2012-02-16 ENCOUNTER — Encounter (HOSPITAL_COMMUNITY): Payer: Self-pay | Admitting: *Deleted

## 2012-02-16 ENCOUNTER — Inpatient Hospital Stay (HOSPITAL_COMMUNITY)
Admission: EM | Admit: 2012-02-16 | Discharge: 2012-02-20 | DRG: 872 | Disposition: A | Discharge status: Home / Self Care | Payer: Medicare Other | Attending: Internal Medicine | Admitting: Internal Medicine

## 2012-02-16 ENCOUNTER — Encounter (HOSPITAL_COMMUNITY): Payer: Medicare Other

## 2012-02-16 DIAGNOSIS — R609 Edema, unspecified: Secondary | ICD-10-CM | POA: Diagnosis not present

## 2012-02-16 DIAGNOSIS — A419 Sepsis, unspecified organism: Secondary | ICD-10-CM | POA: Diagnosis not present

## 2012-02-16 DIAGNOSIS — I10 Essential (primary) hypertension: Secondary | ICD-10-CM | POA: Diagnosis present

## 2012-02-16 DIAGNOSIS — B353 Tinea pedis: Secondary | ICD-10-CM | POA: Diagnosis present

## 2012-02-16 DIAGNOSIS — L02419 Cutaneous abscess of limb, unspecified: Secondary | ICD-10-CM | POA: Diagnosis present

## 2012-02-16 DIAGNOSIS — E669 Obesity, unspecified: Secondary | ICD-10-CM

## 2012-02-16 DIAGNOSIS — I890 Lymphedema, not elsewhere classified: Secondary | ICD-10-CM | POA: Diagnosis present

## 2012-02-16 DIAGNOSIS — R600 Localized edema: Secondary | ICD-10-CM | POA: Diagnosis present

## 2012-02-16 DIAGNOSIS — R079 Chest pain, unspecified: Secondary | ICD-10-CM | POA: Diagnosis not present

## 2012-02-16 DIAGNOSIS — E876 Hypokalemia: Secondary | ICD-10-CM | POA: Diagnosis not present

## 2012-02-16 DIAGNOSIS — I959 Hypotension, unspecified: Secondary | ICD-10-CM | POA: Diagnosis not present

## 2012-02-16 DIAGNOSIS — S21009A Unspecified open wound of unspecified breast, initial encounter: Secondary | ICD-10-CM

## 2012-02-16 DIAGNOSIS — Z981 Arthrodesis status: Secondary | ICD-10-CM | POA: Diagnosis not present

## 2012-02-16 DIAGNOSIS — Z6841 Body Mass Index (BMI) 40.0 and over, adult: Secondary | ICD-10-CM

## 2012-02-16 DIAGNOSIS — Z87891 Personal history of nicotine dependence: Secondary | ICD-10-CM | POA: Diagnosis not present

## 2012-02-16 DIAGNOSIS — Z452 Encounter for adjustment and management of vascular access device: Secondary | ICD-10-CM | POA: Diagnosis not present

## 2012-02-16 DIAGNOSIS — R509 Fever, unspecified: Secondary | ICD-10-CM | POA: Diagnosis not present

## 2012-02-16 DIAGNOSIS — L03116 Cellulitis of left lower limb: Secondary | ICD-10-CM | POA: Diagnosis present

## 2012-02-16 DIAGNOSIS — R6889 Other general symptoms and signs: Secondary | ICD-10-CM | POA: Diagnosis not present

## 2012-02-16 LAB — COMPREHENSIVE METABOLIC PANEL
BUN: 16 mg/dL (ref 6–23)
CO2: 25 mEq/L (ref 19–32)
Chloride: 99 mEq/L (ref 96–112)
Creatinine, Ser: 1.4 mg/dL — ABNORMAL HIGH (ref 0.50–1.35)
GFR calc Af Amer: 70 mL/min — ABNORMAL LOW (ref >90)
GFR calc non Af Amer: 61 mL/min — ABNORMAL LOW (ref >90)
Total Bilirubin: 1.4 mg/dL — ABNORMAL HIGH (ref 0.3–1.2)

## 2012-02-16 LAB — URINALYSIS, ROUTINE W REFLEX MICROSCOPIC
Ketones, ur: NEGATIVE mg/dL
Leukocytes, UA: NEGATIVE
Nitrite: NEGATIVE
Protein, ur: NEGATIVE mg/dL

## 2012-02-16 LAB — URINE MICROSCOPIC-ADD ON

## 2012-02-16 LAB — CBC WITH DIFFERENTIAL/PLATELET
HCT: 41.5 % (ref 39.0–52.0)
Hemoglobin: 14 g/dL (ref 13.0–17.0)
Lymphocytes Relative: 7 % — ABNORMAL LOW (ref 12–46)
MCHC: 33.7 g/dL (ref 30.0–36.0)
MCV: 89.2 fL (ref 78.0–100.0)
Monocytes Absolute: 0.7 10*3/uL (ref 0.1–1.0)
Monocytes Relative: 4 % (ref 3–12)
Neutro Abs: 17.4 10*3/uL — ABNORMAL HIGH (ref 1.7–7.7)
WBC: 19.5 10*3/uL — ABNORMAL HIGH (ref 4.0–10.5)

## 2012-02-16 LAB — MAGNESIUM: Magnesium: 1.2 mg/dL — ABNORMAL LOW (ref 1.5–2.5)

## 2012-02-16 LAB — INFLUENZA PANEL BY PCR (TYPE A & B)
H1N1 flu by pcr: NOT DETECTED
Influenza A By PCR: NEGATIVE
Influenza B By PCR: NEGATIVE

## 2012-02-16 LAB — MRSA PCR SCREENING: MRSA by PCR: NEGATIVE

## 2012-02-16 LAB — PRO B NATRIURETIC PEPTIDE: Pro B Natriuretic peptide (BNP): 149.3 pg/mL — ABNORMAL HIGH (ref 0–125)

## 2012-02-16 LAB — TSH: TSH: 0.781 u[IU]/mL (ref 0.350–4.500)

## 2012-02-16 LAB — LACTIC ACID, PLASMA: Lactic Acid, Venous: 1.5 mmol/L (ref 0.5–2.2)

## 2012-02-16 MED ORDER — SORBITOL 70 % SOLN: 30.0000 mL | Freq: Every day | Status: DC | PRN

## 2012-02-16 MED ORDER — VANCOMYCIN HCL 10 G IV SOLR
1500.0000 mg | Freq: Two times a day (BID) | INTRAVENOUS | Status: DC
  Administered 2012-02-16 – 2012-02-18 (×4): 1500 mg via INTRAVENOUS
  Filled 2012-02-16 (×4): qty 1500

## 2012-02-16 MED ORDER — MAGNESIUM SULFATE 40 MG/ML IJ SOLN
4.0000 g | Freq: Once | INTRAMUSCULAR | Status: AC
  Administered 2012-02-16: 4 g via INTRAVENOUS
  Filled 2012-02-16: qty 100

## 2012-02-16 MED ORDER — ACETAMINOPHEN 500 MG PO TABS
1000.0000 mg | ORAL_TABLET | Freq: Once | ORAL | Status: AC
  Administered 2012-02-16: 1000 mg via ORAL
  Filled 2012-02-16: qty 2

## 2012-02-16 MED ORDER — VANCOMYCIN HCL IN DEXTROSE 1-5 GM/200ML-% IV SOLN
1000.0000 mg | Freq: Once | INTRAVENOUS | Status: AC
  Administered 2012-02-16: 1000 mg via INTRAVENOUS
  Filled 2012-02-16: qty 200

## 2012-02-16 MED ORDER — SODIUM CHLORIDE 0.9 % IV SOLN
1000.0000 mL | INTRAVENOUS | Status: DC
  Administered 2012-02-16: 1000 mL via INTRAVENOUS

## 2012-02-16 MED ORDER — SODIUM CHLORIDE 0.9 % IV SOLN
1000.0000 mL | Freq: Once | INTRAVENOUS | Status: AC
  Administered 2012-02-16: 1000 mL via INTRAVENOUS

## 2012-02-16 MED ORDER — TRAZODONE HCL 50 MG PO TABS
50.0000 mg | ORAL_TABLET | Freq: Every evening | ORAL | Status: DC | PRN
  Administered 2012-02-17: 50 mg via ORAL
  Filled 2012-02-16 (×2): qty 1

## 2012-02-16 MED ORDER — HYDROMORPHONE HCL PF 1 MG/ML IJ SOLN
0.5000 mg | INTRAMUSCULAR | Status: DC | PRN
  Administered 2012-02-16 – 2012-02-18 (×9): 0.5 mg via INTRAVENOUS
  Filled 2012-02-16 (×9): qty 1

## 2012-02-16 MED ORDER — ACETAMINOPHEN 500 MG PO TABS
1000.0000 mg | ORAL_TABLET | Freq: Four times a day (QID) | ORAL | Status: DC | PRN
  Administered 2012-02-17 – 2012-02-18 (×3): 1000 mg via ORAL
  Filled 2012-02-16 (×3): qty 2

## 2012-02-16 MED ORDER — POLYETHYLENE GLYCOL 3350 17 G PO PACK: 17.0000 g | PACK | Freq: Every day | ORAL | Status: DC | PRN

## 2012-02-16 MED ORDER — POTASSIUM CHLORIDE CRYS ER 20 MEQ PO TBCR
40.0000 meq | EXTENDED_RELEASE_TABLET | Freq: Once | ORAL | Status: AC
  Administered 2012-02-16: 40 meq via ORAL
  Filled 2012-02-16: qty 2

## 2012-02-16 MED ORDER — ENOXAPARIN SODIUM 40 MG/0.4ML ~~LOC~~ SOLN
40.0000 mg | SUBCUTANEOUS | Status: DC
  Administered 2012-02-16 – 2012-02-17 (×2): 40 mg via SUBCUTANEOUS
  Filled 2012-02-16 (×2): qty 0.4

## 2012-02-16 MED ORDER — VANCOMYCIN HCL 10 G IV SOLR
2000.0000 mg | Freq: Once | INTRAVENOUS | Status: AC
  Administered 2012-02-16: 2000 mg via INTRAVENOUS
  Filled 2012-02-16: qty 2000

## 2012-02-16 MED ORDER — POTASSIUM CHLORIDE IN NACL 20-0.9 MEQ/L-% IV SOLN
INTRAVENOUS | Status: DC
  Administered 2012-02-16 – 2012-02-19 (×8): via INTRAVENOUS

## 2012-02-16 MED ORDER — ONDANSETRON HCL 4 MG/2ML IJ SOLN: 4.0000 mg | INTRAMUSCULAR | Status: DC | PRN

## 2012-02-16 MED ORDER — PIPERACILLIN-TAZOBACTAM 3.375 G IVPB
3.3750 g | Freq: Three times a day (TID) | INTRAVENOUS | Status: DC
  Administered 2012-02-16 – 2012-02-20 (×12): 3.375 g via INTRAVENOUS
  Filled 2012-02-16 (×22): qty 50

## 2012-02-16 MED ORDER — MAGNESIUM CITRATE PO SOLN: 1.0000 | Freq: Once | ORAL | Status: AC | PRN

## 2012-02-16 MED ORDER — PIPERACILLIN-TAZOBACTAM 3.375 G IVPB 30 MIN
3.3750 g | Freq: Once | INTRAVENOUS | Status: AC
  Administered 2012-02-16: 3.375 g via INTRAVENOUS
  Filled 2012-02-16 (×2): qty 50

## 2012-02-16 NOTE — Progress Notes (Signed)
Patient was admitted to the hospital earlier this morning by Dr. Orvan Falconer.  Patient seen and examined, database reviewed.  Admitted with generalized malaise, myalgias, fevers, left lower extremity edema and erythema. He does have a history of recurrent cellulitis in his lower extremities and lymphedema. He has been started on broad-spectrum antibiotics including vancomycin and Zosyn. His blood pressure remains in the lower side. We'll place a PICC line in case vasopressors are needed. We'll also check venous Dopplers to rule out lower extremity DVT. With this high fevers and myalgias, we'll check influenza PCR. Continue current treatment. He does appear to be weak, but is mentating appropriately and is able to carry on a conversation. He does not feel significantly short of breath at this time.

## 2012-02-16 NOTE — Plan of Care (Signed)
Problem: Phase I Progression Outcomes Goal: Wound assessment- dressing change as appropriate Outcome: Progressing No dsg
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
## 2012-02-16 NOTE — Care Management Note (Signed)
    Page 1 of 1   02/20/2012     1:35:25 PM   CARE MANAGEMENT NOTE 02/20/2012  Patient:  Jordan Jensen, Jordan Jensen   Account Number:  [id number]  Date Initiated:  02/16/2012  Documentation initiated by:  Rosemary Holms  Subjective/Objective Assessment:   PTA lived at home with wife, mother and 2 daughters. Spoke to pt and he plans on returning to home where he has a lot of support.     Action/Plan:   Will see how pt progresses but will DC home, ? HH services.   Anticipated DC Date:  02/18/2012   Anticipated DC Plan:  HOME/SELF CARE      DC Planning Services  CM consult      Choice offered to / List presented to:             Status of service:  Completed, signed off Medicare Important Message given?   (If response is "NO", the following Medicare IM given date fields will be blank) Date Medicare IM given:   Date Additional Medicare IM given:    Discharge Disposition:  HOME/SELF CARE  Per UR Regulation:    If discussed at Long Length of Stay Meetings, dates discussed:    Comments:  02/16/12 Rosemary Holms RN BSN CM

## 2012-02-16 NOTE — ED Notes (Signed)
Pt arrived from home via ems d/t generalized pain and fever.
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
## 2012-02-17 ENCOUNTER — Encounter (HOSPITAL_COMMUNITY): Payer: Self-pay | Admitting: Radiology

## 2012-02-17 ENCOUNTER — Inpatient Hospital Stay (HOSPITAL_COMMUNITY): Payer: Medicare Other

## 2012-02-17 DIAGNOSIS — A419 Sepsis, unspecified organism: Secondary | ICD-10-CM | POA: Diagnosis not present

## 2012-02-17 DIAGNOSIS — E876 Hypokalemia: Secondary | ICD-10-CM

## 2012-02-17 DIAGNOSIS — I10 Essential (primary) hypertension: Secondary | ICD-10-CM | POA: Diagnosis not present

## 2012-02-17 DIAGNOSIS — L02419 Cutaneous abscess of limb, unspecified: Secondary | ICD-10-CM | POA: Diagnosis not present

## 2012-02-17 LAB — BASIC METABOLIC PANEL
CO2: 24 mEq/L (ref 19–32)
Calcium: 8.3 mg/dL — ABNORMAL LOW (ref 8.4–10.5)
Creatinine, Ser: 1.11 mg/dL (ref 0.50–1.35)
GFR calc Af Amer: >90 mL/min (ref >90)
GFR calc non Af Amer: 80 mL/min — ABNORMAL LOW (ref >90)
Sodium: 136 mEq/L (ref 135–145)

## 2012-02-17 LAB — CBC
HCT: 36 % — ABNORMAL LOW (ref 39.0–52.0)
Hemoglobin: 12 g/dL — ABNORMAL LOW (ref 13.0–17.0)
Platelets: 126 10*3/uL — ABNORMAL LOW (ref 150–400)
RBC: 3.97 MIL/uL — ABNORMAL LOW (ref 4.22–5.81)
RBC: 3.95 MIL/uL — ABNORMAL LOW (ref 4.22–5.81)
RDW: 14.3 % (ref 11.5–15.5)
WBC: 20.7 10*3/uL — ABNORMAL HIGH (ref 4.0–10.5)
WBC: 19.6 10*3/uL — ABNORMAL HIGH (ref 4.0–10.5)

## 2012-02-17 LAB — URINE CULTURE: Colony Count: NO GROWTH

## 2012-02-17 MED ORDER — GADOBENATE DIMEGLUMINE 529 MG/ML IV SOLN
20.0000 mL | Freq: Once | INTRAVENOUS | Status: AC | PRN
  Administered 2012-02-17: 20 mL via INTRAVENOUS

## 2012-02-17 MED ORDER — SODIUM CHLORIDE 0.9 % IJ SOLN: 10.0000 mL | INTRAMUSCULAR | Status: DC | PRN

## 2012-02-17 MED ORDER — ENOXAPARIN SODIUM 80 MG/0.8ML ~~LOC~~ SOLN
80.0000 mg | SUBCUTANEOUS | Status: DC
  Administered 2012-02-18 – 2012-02-20 (×3): 80 mg via SUBCUTANEOUS
  Filled 2012-02-17 (×3): qty 0.8

## 2012-02-17 MED ORDER — SODIUM CHLORIDE 0.9 % IJ SOLN
10.0000 mL | Freq: Two times a day (BID) | INTRAMUSCULAR | Status: DC
  Administered 2012-02-17 – 2012-02-19 (×5): 10 mL

## 2012-02-17 NOTE — Progress Notes (Signed)
[facility] PROGRESS NOTE  Jordan Jensen ZOX:[id number] DOB: Sep 14, 1970 DOA: 02/16/2012 PCP: Shelly Flatten, MD  Assessment/Plan: Active Problems:   Bilateral leg edema: DVT ruled out   Obesity   Cellulitis of left leg: On broad-spectrum antibiotics. See below.   Lymphedema   Sepsis-noted continuing fevers, inc WBC.  Will check MRI to rule out abscess.  Repeat CBC this afternoon to see if thic could be just a delay in response.   Hypotension: Improved slightly, responded to IV fluids   Hypokalemia   Code Status: Full Family Communication: Wife at bedside Disposition Plan: Out of unit once confirmed infectious source improved   Consultants:  None  Procedures:  Picc line placed  Antibiotics:  IV Vanco & Zosyn Day 2  HPI/Subjective: Patient doing okay. Overall feeling better. Leg pains and less although redness persisting. As a mild headache and some lower back pain. Thinks it could be from the bed. No shortness of breath or dysuria.  Objective: Filed Vitals:   02/17/12 0500 02/17/12 0522 02/17/12 0532 02/17/12 0600  BP: 107/51     Pulse: 104     Temp:  100.9 F (38.3 C)    TempSrc:      Resp: 37     Height:      Weight:   158.8 kg (350 lb 1.5 oz) 158.759 kg (350 lb)  SpO2: 98%       Intake/Output Summary (Last 24 hours) at 02/17/12 8119 Last data filed at 02/17/12 0528  Gross per 24 hour  Intake   3660 ml  Output    975 ml  Net   2685 ml   Filed Weights   02/16/12 0542 02/17/12 0532 02/17/12 0600  Weight: 155.085 kg (341 lb 14.4 oz) 158.8 kg (350 lb 1.5 oz) 158.759 kg (350 lb)    Exam:   General:  Alert and oriented x3, no acute distress  Cardiovascular: Regular rate and rhythm, S1-S2  Respiratory: Clear to auscultation bilaterally, decreased secondary to body habitus  Abdomen: Soft, morbidly obese, nontender, positive bowel sounds  Ext: Bilateral chronic venostasis, 3+ pitting edema bilaterally, left lower extremity from the knee down is  warm to touch with some erythema. Patient states it is much improved in terms of tenderness. Based on markings from yesterday, cellulitis looks to be improved  Data Reviewed: Basic Metabolic Panel:  Recent Labs Lab 02/16/12 0230 02/17/12 0530  NA 135 136  K 3.2* 4.1  CL 99 102  CO2 25 24  GLUCOSE 102* 110*  BUN 16 11  CREATININE 1.40* 1.11  CALCIUM 9.6 8.3*  MG 1.2*  --    Liver Function Tests:  Recent Labs Lab 02/16/12 0230  AST 26  ALT 19  ALKPHOS 44  BILITOT 1.4*  PROT 7.1  ALBUMIN 3.9   CBC:  Recent Labs Lab 02/16/12 0230 02/17/12 0530  WBC 19.5* 20.7*  NEUTROABS 17.4*  --   HGB 14.0 12.2*  HCT 41.5 36.4*  MCV 89.2 91.7  PLT 150 126*   BNP (last 3 results)  Recent Labs  02/16/12 0230  PROBNP 149.3*     Recent Results (from the past 240 hour(s))  CULTURE, BLOOD (ROUTINE X 2)     Status: None   Collection Time    02/16/12  2:30 AM      Result Value Range Status   Specimen Description BLOOD RIGHT ANTECUBITAL DRAWN BY RN DW   Final   Special Requests BOTTLES DRAWN AEROBIC AND ANAEROBIC 7CC   Final  Culture NO GROWTH <24 HRS   Final   Report Status PENDING   Incomplete  CULTURE, BLOOD (ROUTINE X 2)     Status: None   Collection Time    02/16/12  3:02 AM      Result Value Range Status   Specimen Description BLOOD LEFT ANTECUBITAL   Final   Special Requests BOTTLES DRAWN AEROBIC AND ANAEROBIC 7CC   Final   Culture NO GROWTH <24 HRS   Final   Report Status PENDING   Incomplete  MRSA PCR SCREENING     Status: None   Collection Time    02/16/12  5:40 AM      Result Value Range Status   MRSA by PCR NEGATIVE  NEGATIVE Final   Comment:            The GeneXpert MRSA Assay (FDA     approved for NASAL specimens     only), is one component of a     comprehensive MRSA colonization     surveillance program. It is not     intended to diagnose MRSA     infection nor to guide or     monitor treatment for     MRSA infections.     Studies: Dg Chest  2 View  02/16/2012  IMPRESSION: No acute cardiopulmonary process seen.   Original Report Authenticated By: Tonia Ghent, M.D.    US Venous Img Lower Bilateral  02/16/2012  IMPRESSION: No deep venous thrombosis in the visualized bilateral lower extremities.  Subcutaneous edema in the calf.   Original Report Authenticated By: Charline Bills, M.D.    Dg Chest Port 1 View  02/16/2012  *RADIOLOGY REPORT*  Clinical Data: PICC line placement  PORTABLE CHEST - 1 VIEW  Comparison: 02/16/2012  Findings: Left arm PICC has its tip in the SVC 2 cm above the right atrium.  Lungs remain clear.  IMPRESSION: Picc well positioned with its tip in the SVC 2 cm above the right atrium.   Original Report Authenticated By: Paulina Fusi, M.D.     Scheduled Meds: . enoxaparin (LOVENOX) injection  40 mg Subcutaneous Q24H  . piperacillin-tazobactam (ZOSYN)  IV  3.375 g Intravenous Q8H  . vancomycin  1,500 mg Intravenous Q12H   Continuous Infusions: . 0.9 % NaCl with KCl 20 mEq / L 150 mL/hr at 02/17/12 8469    Active Problems:   Bilateral leg edema   Obesity   Cellulitis of left leg   Lymphedema   Sepsis   Hypotension   Hypokalemia    Time spent: 30 min    [name]  [facility] Pager [phone]. If 8PM-8AM, please contact night-coverage at [email or web address], password [facility] 02/17/2012, 9:22 AM  LOS: 1 day

## 2012-02-17 NOTE — Clinical Documentation Improvement (Signed)
BMI DOCUMENTATION CLARIFICATION QUERY  THIS DOCUMENT IS NOT A PERMANENT PART OF THE MEDICAL RECORD  TO RESPOND TO THE THIS QUERY, FOLLOW THE INSTRUCTIONS BELOW:  1. If needed, update documentation for the patient's encounter via the notes activity.  2. Access this query again and click edit on the In [employer].  3. After updating, or not, click F2 to complete all highlighted (required) fields concerning your review. Select "additional documentation in the medical record" OR "no additional documentation provided".  4. Click Sign note button.  5. The deficiency will fall out of your In Basket *Please let us know if you are not able to complete this workflow by phone or e-mail (listed below).         02/17/12  Dear Dr. Kerry Hough Marton Redwood  In an effort to better capture your patient's severity of illness, reflect appropriate length of stay and utilization of resources, a review of the patient medical record has revealed the following indicators.    Based on your clinical judgment, please clarify and document in a progress note and/or discharge summary the clinical condition associated with the following supporting information:  In responding to this query please exercise your independent judgment.  The fact that a query is asked, does not imply that any particular answer is desired or expected.  Possible Clinical conditions  Morbid Obesity W/ BMI=   Other condition___________________  Cannot Clinically determine _____________  NutrClinical Information:  Risk Factors: Cellulitis Left leg Lymphedema  "obesity"  Signs & Symptoms: Weight: 350 lbs  Height 5\' 11"   BMI = 45.77  Diagnostics: Lab:   Treatment Heart diet daily weights  Reviewed: Response found in 2/12 pn Morbidly obese=Krishnan Thank You,  Harless Litten RN, MSN Clinical Documentation Specialist: Office# [phone]  [facility] [place]

## 2012-02-17 NOTE — Progress Notes (Signed)
UR Chart Review Completed  

## 2012-02-17 NOTE — Progress Notes (Signed)
Pt's 4AM temp was 103.1, prn tylenol given at 0422, will recheck at [facility], MD on call, Dr. Orvan Falconer text paged.  Will cont to monitor

## 2012-02-18 DIAGNOSIS — A419 Sepsis, unspecified organism: Secondary | ICD-10-CM | POA: Diagnosis not present

## 2012-02-18 DIAGNOSIS — I890 Lymphedema, not elsewhere classified: Secondary | ICD-10-CM | POA: Diagnosis not present

## 2012-02-18 DIAGNOSIS — I959 Hypotension, unspecified: Secondary | ICD-10-CM | POA: Diagnosis not present

## 2012-02-18 DIAGNOSIS — L03119 Cellulitis of unspecified part of limb: Secondary | ICD-10-CM | POA: Diagnosis not present

## 2012-02-18 LAB — CBC
Hemoglobin: 11.6 g/dL — ABNORMAL LOW (ref 13.0–17.0)
MCH: 29.8 pg (ref 26.0–34.0)
MCV: 91.3 fL (ref 78.0–100.0)
Platelets: 111 10*3/uL — ABNORMAL LOW (ref 150–400)
RBC: 3.89 MIL/uL — ABNORMAL LOW (ref 4.22–5.81)
WBC: 17.2 10*3/uL — ABNORMAL HIGH (ref 4.0–10.5)

## 2012-02-18 LAB — BASIC METABOLIC PANEL
CO2: 24 mEq/L (ref 19–32)
Calcium: 8.8 mg/dL (ref 8.4–10.5)
Chloride: 101 mEq/L (ref 96–112)
Creatinine, Ser: 0.97 mg/dL (ref 0.50–1.35)
Glucose, Bld: 85 mg/dL (ref 70–99)

## 2012-02-18 LAB — VANCOMYCIN, TROUGH: Vancomycin Tr: 9 ug/mL — ABNORMAL LOW (ref 10.0–20.0)

## 2012-02-18 MED ORDER — VANCOMYCIN HCL 10 G IV SOLR
1750.0000 mg | Freq: Two times a day (BID) | INTRAVENOUS | Status: DC
  Administered 2012-02-18 – 2012-02-20 (×4): 1750 mg via INTRAVENOUS
  Filled 2012-02-18 (×10): qty 1750
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
## 2012-02-18 NOTE — Progress Notes (Signed)
[facility] PROGRESS NOTE  Jordan Jensen VHQ:[id number] DOB: 02/20/1970 DOA: 02/16/2012 PCP: Shelly Flatten, MD  Assessment/Plan: Active Problems:   Bilateral leg edema: DVT ruled out   Obesity   Cellulitis of left leg: On broad-spectrum antibiotics. See below.   Lymphedema   Sepsis-noted continuing fevers, inc WBC.  MRI negative. CBC staining and 19 yesterday, but then came down today to 17. In discussion with my colleagues in review of previous medical record, patient had a very similar hospitalization in October of 2013. At that time he was admitted for cellulitis of the leg and was put on broad-spectrum antibiotics but it took several days before his white count started to decline and that time he had fever spikes despite negative workup for other sources.   Hypotension: Improved slightly, responded to IV fluids. Blood pressure now around 90   Hypokalemia   Code Status: Full Family Communication: Discuss with family last night Disposition Plan: Given soft blood pressures, decreased to step down status but keep in the unit for at least one more day   Consultants:  None  Procedures:  Picc line placed  Antibiotics:  IV Vanco & Zosyn Day 3  HPI/Subjective: Patient doing better. Less leg pain. Does not feel feverish.  Objective: Filed Vitals:   02/18/12 0800 02/18/12 0900 02/18/12 1000 02/18/12 1100  BP:  107/65 116/65 119/70  Pulse: 79 75 80 73  Temp: 98.9 F (37.2 C)     TempSrc: Oral     Resp: 15     Height:      Weight:      SpO2: 96% 95% 98% 97%    Intake/Output Summary (Last 24 hours) at 02/18/12 1338 Last data filed at 02/18/12 1000  Gross per 24 hour  Intake 4352.5 ml  Output   1560 ml  Net 2792.5 ml   Filed Weights   02/17/12 0532 02/17/12 0600 02/18/12 0500  Weight: 158.8 kg (350 lb 1.5 oz) 158.759 kg (350 lb) 162.2 kg (357 lb 9.4 oz)    Exam:   General:  Alert and oriented x3, no acute distress  Cardiovascular: Regular rate and rhythm,  S1-S2  Respiratory: Clear to auscultation bilaterally, decreased secondary to body habitus  Abdomen: Soft, morbidly obese, nontender, positive bowel sounds  Ext: Bilateral chronic venostasis, 3+ pitting edema bilaterally, left lower extremity from the knee down is warm to touch with minimal erythema. Patient states this is nontender.  Data Reviewed: Basic Metabolic Panel:  Recent Labs Lab 02/16/12 0230 02/17/12 0530 02/18/12 0433  NA 135 136 133*  K 3.2* 4.1 4.0  CL 99 102 101  CO2 25 24 24   GLUCOSE 102* 110* 85  BUN 16 11 7   CREATININE 1.40* 1.11 0.97  CALCIUM 9.6 8.3* 8.8  MG 1.2*  --   --    Liver Function Tests:  Recent Labs Lab 02/16/12 0230  AST 26  ALT 19  ALKPHOS 44  BILITOT 1.4*  PROT 7.1  ALBUMIN 3.9   CBC:  Recent Labs Lab 02/16/12 0230 02/17/12 0530 02/17/12 1510 02/18/12 0433  WBC 19.5* 20.7* 19.6* 17.2*  NEUTROABS 17.4*  --   --   --   HGB 14.0 12.2* 12.0* 11.6*  HCT 41.5 36.4* 36.0* 35.5*  MCV 89.2 91.7 91.1 91.3  PLT 150 126* 109* 111*   BNP (last 3 results)  Recent Labs  02/16/12 0230  PROBNP 149.3*     Recent Results (from the past 240 hour(s))  CULTURE, BLOOD (ROUTINE X 2)  Status: None   Collection Time    02/16/12  2:30 AM      Result Value Range Status   Specimen Description BLOOD RIGHT ANTECUBITAL DRAWN BY RN DW   Final   Special Requests BOTTLES DRAWN AEROBIC AND ANAEROBIC 7CC   Final   Culture NO GROWTH 1 DAY   Final   Report Status PENDING   Incomplete  CULTURE, BLOOD (ROUTINE X 2)     Status: None   Collection Time    02/16/12  3:02 AM      Result Value Range Status   Specimen Description BLOOD LEFT ANTECUBITAL   Final   Special Requests BOTTLES DRAWN AEROBIC AND ANAEROBIC 7CC   Final   Culture NO GROWTH 1 DAY   Final   Report Status PENDING   Incomplete  MRSA PCR SCREENING     Status: None   Collection Time    02/16/12  5:40 AM      Result Value Range Status   MRSA by PCR NEGATIVE  NEGATIVE Final    Comment:            The GeneXpert MRSA Assay (FDA     approved for NASAL specimens     only), is one component of a     comprehensive MRSA colonization     surveillance program. It is not     intended to diagnose MRSA     infection nor to guide or     monitor treatment for     MRSA infections.  URINE CULTURE     Status: None   Collection Time    02/16/12  3:10 PM      Result Value Range Status   Specimen Description URINE, CLEAN CATCH   Final   Special Requests NONE   Final   Culture  Setup Time 02/16/2012 18:45   Final   Colony Count NO GROWTH   Final   Culture NO GROWTH   Final   Report Status 02/17/2012 FINAL   Final     Studies: Dg Chest 2 View  02/16/2012  IMPRESSION: No acute cardiopulmonary process seen.   Original Report Authenticated By: Tonia Ghent, M.D.     MRI of left lower extremity on 2/11 with and without contrast: Signs of cellulitis but no sign of abscess. US Venous Img Lower Bilateral  02/16/2012  IMPRESSION: No deep venous thrombosis in the visualized bilateral lower extremities.  Subcutaneous edema in the calf.   Original Report Authenticated By: Charline Bills, M.D.    Dg Chest Port 1 View  02/16/2012   IMPRESSION: Picc well positioned with its tip in the SVC 2 cm above the right atrium.   Original Report Authenticated By: Paulina Fusi, M.D.     Scheduled Meds: . enoxaparin (LOVENOX) injection  80 mg Subcutaneous Q24H  . piperacillin-tazobactam (ZOSYN)  IV  3.375 g Intravenous Q8H  . sodium chloride  10-40 mL Intracatheter Q12H  . vancomycin  1,750 mg Intravenous Q12H   Continuous Infusions: . 0.9 % NaCl with KCl 20 mEq / L 150 mL/hr at 02/18/12 1000    Active Problems:   Bilateral leg edema   Obesity   Cellulitis of left leg   Lymphedema   Sepsis   Hypotension   Hypokalemia    Time spent: 20 min    [name]  [facility] Pager [phone]. If 8PM-8AM, please contact night-coverage at [email or web address], password  [facility] 02/18/2012, 1:38 PM  LOS: 2 days

## 2012-02-19 DIAGNOSIS — A419 Sepsis, unspecified organism: Secondary | ICD-10-CM | POA: Diagnosis not present

## 2012-02-19 DIAGNOSIS — L03119 Cellulitis of unspecified part of limb: Secondary | ICD-10-CM | POA: Diagnosis not present

## 2012-02-19 DIAGNOSIS — I10 Essential (primary) hypertension: Secondary | ICD-10-CM | POA: Diagnosis not present

## 2012-02-19 DIAGNOSIS — I890 Lymphedema, not elsewhere classified: Secondary | ICD-10-CM | POA: Diagnosis not present

## 2012-02-19 LAB — BASIC METABOLIC PANEL
BUN: 7 mg/dL (ref 6–23)
Calcium: 8.7 mg/dL (ref 8.4–10.5)
Creatinine, Ser: 0.94 mg/dL (ref 0.50–1.35)
GFR calc Af Amer: >90 mL/min (ref >90)
GFR calc non Af Amer: >90 mL/min (ref >90)

## 2012-02-19 LAB — CBC
HCT: 35.2 % — ABNORMAL LOW (ref 39.0–52.0)
MCH: 30 pg (ref 26.0–34.0)
MCHC: 33.2 g/dL (ref 30.0–36.0)
MCV: 90.3 fL (ref 78.0–100.0)
Platelets: 139 10*3/uL — ABNORMAL LOW (ref 150–400)
RDW: 13.9 % (ref 11.5–15.5)
WBC: 12.7 10*3/uL — ABNORMAL HIGH (ref 4.0–10.5)

## 2012-02-19 NOTE — Progress Notes (Signed)
PT'S ORTHOSTATIC VITAL SIGNS PREFORMED. PT AMBULATED FROM RECLINER TO BARIATRIC BED W/O ANY DIFFICULTY. TOLEREATED VERY WELL. PT THEN TRANSFERRED TO ROOM 337 ON TELEMETRY. IV'S OF LT PICC LINE AND IV NSL IN RT FOREARM ARE PATENT. PT VOIDING IN URINAL W/O DIFFICULTY. RLE EDEMA REMAINS 2+ AND LLE REMAINS 4+. HR 80-90 IN NSR. DENIES ANY PAIN OR DISCOMFORT. NO BROKEN SKIN AREAS. WIFE AT BEDSIDE. TRANSFER REPORT CALLED TO JUDY RN ON 300.

## 2012-02-19 NOTE — Progress Notes (Signed)
[facility] PROGRESS NOTE  Jordan Jensen AVW:[id number] DOB: 01-10-1970 DOA: 02/16/2012 PCP: Shelly Flatten, MD  Assessment/Plan: Active Problems:   Bilateral leg edema: DVT ruled out , this is more from chronic lymphedema    Obesity    Cellulitis of left leg: On broad-spectrum antibiotics. See below. Continues to improve.    Lymphedema -chronic    Sepsis-secondary to cellulitis of leg. Patient had a significant delay in hypotension resolving, decreasing leukocytosis and spiking temperatures. Other source of infection workup was negative. Patient had a similar presentation in October of 2013. Today is much better her white count has significantly decreased down to 12 pressures improved. His leg looks better and patient is feeling much better. Transfer to floor and start ambulating. Change to by mouth antibiotics tomorrow once white count fully resolved.    Hypotension: Blood pressure finally improving and elevated today. Will KVO IV fluids.    Hypokalemia: Stable   Code Status: Full Family Communication: Discussed with wife at the bedside Disposition Plan: Transfer to floor and start ambulating. Home possibly as early as tomorrow or Saturday  Consultants:  None  Procedures:  Picc line placed  Antibiotics:  IV Vanco & Zosyn Day 4  HPI/Subjective: Patient feeling great. Moved to chair. Feels like walking around.  Objective: Filed Vitals:   02/19/12 0400 02/19/12 0500 02/19/12 0600 02/19/12 0700  BP: 115/73  125/83 148/111  Pulse: 65 69 80 72  Temp: 98.9 F (37.2 C)     TempSrc: Oral     Resp:  29 26 23   Height:      Weight:  160.1 kg (352 lb 15.3 oz)    SpO2: 96% 97% 97% 97%    Intake/Output Summary (Last 24 hours) at 02/19/12 0954 Last data filed at 02/19/12 0524  Gross per 24 hour  Intake   4930 ml  Output   4400 ml  Net    530 ml   Filed Weights   02/17/12 0600 02/18/12 0500 02/19/12 0500  Weight: 158.759 kg (350 lb) 162.2 kg (357 lb 9.4 oz) 160.1  kg (352 lb 15.3 oz)    Exam:   General:  Alert and oriented x3, no acute distress  Cardiovascular: Regular rate and rhythm, S1-S2  Respiratory: Clear to auscultation bilaterally, decreased secondary to body habitus  Abdomen: Soft, morbidly obese, nontender, positive bowel sounds  Ext: Bilateral chronic venostasis, 3+ pitting edema bilaterally, erythema centrally resolved. Patient states this is nontender.  Data Reviewed: Basic Metabolic Panel:  Recent Labs Lab 02/16/12 0230 02/17/12 0530 02/18/12 0433 02/19/12 0433  NA 135 136 133* 138  K 3.2* 4.1 4.0 3.9  CL 99 102 101 106  CO2 25 24 24 24   GLUCOSE 102* 110* 85 99  BUN 16 11 7 7   CREATININE 1.40* 1.11 0.97 0.94  CALCIUM 9.6 8.3* 8.8 8.7  MG 1.2*  --   --   --    Liver Function Tests:  Recent Labs Lab 02/16/12 0230  AST 26  ALT 19  ALKPHOS 44  BILITOT 1.4*  PROT 7.1  ALBUMIN 3.9   CBC:  Recent Labs Lab 02/16/12 0230 02/17/12 0530 02/17/12 1510 02/18/12 0433 02/19/12 0433  WBC 19.5* 20.7* 19.6* 17.2* 12.7*  NEUTROABS 17.4*  --   --   --   --   HGB 14.0 12.2* 12.0* 11.6* 11.7*  HCT 41.5 36.4* 36.0* 35.5* 35.2*  MCV 89.2 91.7 91.1 91.3 90.3  PLT 150 126* 109* 111* 139*   BNP (last 3 results)  Recent Labs  02/16/12 0230  PROBNP 149.3*     Recent Results (from the past 240 hour(s))  CULTURE, BLOOD (ROUTINE X 2)     Status: None   Collection Time    02/16/12  2:30 AM      Result Value Range Status   Specimen Description BLOOD RIGHT ANTECUBITAL DRAWN BY RN DW   Final   Special Requests BOTTLES DRAWN AEROBIC AND ANAEROBIC 7CC   Final   Culture NO GROWTH 1 DAY   Final   Report Status PENDING   Incomplete  CULTURE, BLOOD (ROUTINE X 2)     Status: None   Collection Time    02/16/12  3:02 AM      Result Value Range Status   Specimen Description BLOOD LEFT ANTECUBITAL   Final   Special Requests BOTTLES DRAWN AEROBIC AND ANAEROBIC 7CC   Final   Culture NO GROWTH 1 DAY   Final   Report Status  PENDING   Incomplete  MRSA PCR SCREENING     Status: None   Collection Time    02/16/12  5:40 AM      Result Value Range Status   MRSA by PCR NEGATIVE  NEGATIVE Final   Comment:            The GeneXpert MRSA Assay (FDA     approved for NASAL specimens     only), is one component of a     comprehensive MRSA colonization     surveillance program. It is not     intended to diagnose MRSA     infection nor to guide or     monitor treatment for     MRSA infections.  URINE CULTURE     Status: None   Collection Time    02/16/12  3:10 PM      Result Value Range Status   Specimen Description URINE, CLEAN CATCH   Final   Special Requests NONE   Final   Culture  Setup Time 02/16/2012 18:45   Final   Colony Count NO GROWTH   Final   Culture NO GROWTH   Final   Report Status 02/17/2012 FINAL   Final     Studies: Dg Chest 2 View  02/16/2012  IMPRESSION: No acute cardiopulmonary process seen.   Original Report Authenticated By: Tonia Ghent, M.D.     MRI of left lower extremity on 2/11 with and without contrast: Signs of cellulitis but no sign of abscess. US Venous Img Lower Bilateral  02/16/2012  IMPRESSION: No deep venous thrombosis in the visualized bilateral lower extremities.  Subcutaneous edema in the calf.   Original Report Authenticated By: Charline Bills, M.D.    Dg Chest Port 1 View  02/16/2012   IMPRESSION: Picc well positioned with its tip in the SVC 2 cm above the right atrium.   Original Report Authenticated By: Paulina Fusi, M.D.     Scheduled Meds: . enoxaparin (LOVENOX) injection  80 mg Subcutaneous Q24H  . piperacillin-tazobactam (ZOSYN)  IV  3.375 g Intravenous Q8H  . sodium chloride  10-40 mL Intracatheter Q12H  . vancomycin  1,750 mg Intravenous Q12H   Continuous Infusions: . 0.9 % NaCl with KCl 20 mEq / L 150 mL/hr at 02/19/12 1610    Active Problems:   Bilateral leg edema   Obesity   Cellulitis of left leg   Lymphedema   Sepsis   Hypotension    Hypokalemia    Time spent: 20 min  [name]  [facility] Pager [phone]. If 8PM-8AM, please contact night-coverage at [email or web address], password [facility] 02/19/2012, 9:54 AM  LOS: 3 days

## 2012-02-20 DIAGNOSIS — L03119 Cellulitis of unspecified part of limb: Secondary | ICD-10-CM | POA: Diagnosis not present

## 2012-02-20 DIAGNOSIS — I959 Hypotension, unspecified: Secondary | ICD-10-CM | POA: Diagnosis not present

## 2012-02-20 DIAGNOSIS — I890 Lymphedema, not elsewhere classified: Secondary | ICD-10-CM | POA: Diagnosis not present

## 2012-02-20 DIAGNOSIS — A419 Sepsis, unspecified organism: Secondary | ICD-10-CM | POA: Diagnosis not present

## 2012-02-20 LAB — BASIC METABOLIC PANEL
BUN: 8 mg/dL (ref 6–23)
Chloride: 103 mEq/L (ref 96–112)
GFR calc Af Amer: >90 mL/min (ref >90)
GFR calc non Af Amer: 89 mL/min — ABNORMAL LOW (ref >90)
Potassium: 4.1 mEq/L (ref 3.5–5.1)

## 2012-02-20 LAB — CBC
HCT: 37.2 % — ABNORMAL LOW (ref 39.0–52.0)
MCHC: 33.1 g/dL (ref 30.0–36.0)
Platelets: 148 10*3/uL — ABNORMAL LOW (ref 150–400)
RDW: 13.7 % (ref 11.5–15.5)
WBC: 10.2 10*3/uL (ref 4.0–10.5)

## 2012-02-20 MED ORDER — LEVOFLOXACIN 500 MG PO TABS: 500.0000 mg | ORAL_TABLET | Freq: Every day | ORAL | Status: AC

## 2012-02-20 NOTE — Discharge Summary (Signed)
Physician Discharge Summary  Jordan Jensen:[id number] DOB: January 08, 1970 DOA: 02/16/2012  PCP: Shelly Flatten, MD  Admit date: 02/16/2012 Discharge date: 02/20/2012  Time spent: 25 minutes  Recommendations for Outpatient Follow-up:  1. Patient being discharged home-we'll follow up with his primary care physician in the next one month  Discharge Diagnoses:  Active Problems:   Bilateral leg edema   Obesity   Cellulitis of left leg   Lymphedema   Sepsis   Hypotension   Hypokalemia   Discharge Condition: *Improved, being discharged home  D heriet recommendation: Low sodium heart healthy  Filed Weights   02/17/12 0600 02/18/12 0500 02/19/12 0500  Weight: 158.759 kg (350 lb) 162.2 kg (357 lb 9.4 oz) 160.1 kg (352 lb 15.3 oz)    History of present illness:   42 year old African American male with past medical history morbid obesity, lymphedema and spinal injury who presents with cellulitis of the left leg, spiking fevers and hypotension on 02/16/12. Patient was admitted to the hospitalist service for management of sepsis secondary to cellulitis. He had a similar presentation for this 4 months ago.  Hospital Course:  Active Problems:   Bilateral leg edema: Secondary to chronic lymphedema plus acute inflammation from cellulitis. Dopplers ruled out DVT.   Obesity    Cellulitis of left leg: See below    Lymphedema    Sepsis: Blood cultures drawn. They did not grow any bacteria. Patient's white count remained persistently elevated around 19 for several days because he continued spiking temperatures. Other sources of infection were investigated, but no signs were found. Patient had a negative for chest x-ray and urinary tract infection. Because of persistent fevers and white count, patient underwent an MRI of the left lower extremity which showed no signs of any abscess, only signs of cellulitis. By 2/12, the patient's white blood cell count started to decrease and by 2/13 had near  normalized. Patient will be discharged home on 4 more days of by mouth Levaquin to complete one week of therapy.   Hypotension: Patient was started on aggressive IV fluids. For several days, his blood pressure remained refractory until 02/19/12. At that point pressures increased significantly and his IV fluids were discontinued. His pressures are elevated on day of discharge and he will be resumed on his home dose of by mouth Lasix.    Hypokalemia: On initial presentation. Electrolytes replace.    Procedures:  None   Consultations:  None  Discharge Exam: Filed Vitals:   02/20/12 0202 02/20/12 0513 02/20/12 0515 02/20/12 0519  BP: 139/91 141/81 152/107 168/107  Pulse: 78     Temp: 99.1 F (37.3 C) 98.3 F (36.8 C)    TempSrc: Oral Oral    Resp: 16     Height:      Weight:      SpO2: 95% 96% 95% 95%    General: Alert and oriented x3, in no acute distress Cardiovascular: Regular rate and rhythm, S1-S2 Respiratory: Clear to auscultation bilaterally Abdomen: Soft, morbidly obese, nontender, positive bowel sounds Extremities: He 3-4+ pitting edema bilaterally of the lower extremities. Of the left lower extremity, erythema resolved  Discharge Instructions  Discharge Orders   Future Orders Complete By Expires     Diet - low sodium heart healthy  As directed     Increase activity slowly  As directed         Medication List    TAKE these medications       furosemide 20 MG tablet  Commonly known  as:  LASIX  Take 1 tablet (20 mg total) by mouth daily.     levofloxacin 500 MG tablet  Commonly known as:  LEVAQUIN  Take 1 tablet (500 mg total) by mouth daily.           Follow-up Information   Follow up with MERRELL, DAVID, MD. Schedule an appointment as soon as possible for a visit in 1 month.   Contact information:   [address] [place] [address] [phone]        The results of significant diagnostics from this hospitalization (including imaging,  microbiology, ancillary and laboratory) are listed below for reference.    Significant Diagnostic Studies: Dg Chest 2 View  02/16/2012    IMPRESSION: No acute cardiopulmonary process seen.   Original Report Authenticated By: Tonia Ghent, M.D.    Mr Tibia Fibula Left W Wo Contrast  02/17/2012    IMPRESSION: Cellulitis.  No abscess.  No osteomyelitis.   Original Report Authenticated By: Andreas Newport, M.D.    US Venous Img Lower Bilateral  02/16/2012  IMPRESSION: No deep venous thrombosis in the visualized bilateral lower extremities.  Subcutaneous edema in the calf.   Original Report Authenticated By: Charline Bills, M.D.    Dg Chest Port 1 View  02/16/2012   IMPRESSION: Picc well positioned with its tip in the SVC 2 cm above the right atrium.   Original Report Authenticated By: Paulina Fusi, M.D.     Microbiology: Recent Results (from the past 240 hour(s))  CULTURE, BLOOD (ROUTINE X 2)     Status: None   Collection Time    02/16/12  2:30 AM      Result Value Range Status   Specimen Description BLOOD RIGHT ANTECUBITAL DRAWN BY RN DW   Final   Special Requests BOTTLES DRAWN AEROBIC AND ANAEROBIC 7CC   Final   Culture NO GROWTH 4 DAYS   Final   Report Status PENDING   Incomplete  CULTURE, BLOOD (ROUTINE X 2)     Status: None   Collection Time    02/16/12  3:02 AM      Result Value Range Status   Specimen Description BLOOD LEFT ANTECUBITAL   Final   Special Requests BOTTLES DRAWN AEROBIC AND ANAEROBIC 7CC   Final   Culture NO GROWTH 4 DAYS   Final   Report Status PENDING   Incomplete  MRSA PCR SCREENING     Status: None   Collection Time    02/16/12  5:40 AM      Result Value Range Status   MRSA by PCR NEGATIVE  NEGATIVE Final   Comment:            The GeneXpert MRSA Assay (FDA     approved for NASAL specimens     only), is one component of a     comprehensive MRSA colonization     surveillance program. It is not     intended to diagnose MRSA     infection nor to guide  or     monitor treatment for     MRSA infections.  URINE CULTURE     Status: None   Collection Time    02/16/12  3:10 PM      Result Value Range Status   Specimen Description URINE, CLEAN CATCH   Final   Special Requests NONE   Final   Culture  Setup Time 02/16/2012 18:45   Final   Colony Count NO GROWTH   Final  Culture NO GROWTH   Final   Report Status 02/17/2012 FINAL   Final     Labs: Basic Metabolic Panel:  Recent Labs Lab 02/16/12 0230 02/17/12 0530 02/18/12 0433 02/19/12 0433 02/20/12 0454  NA 135 136 133* 138 137  K 3.2* 4.1 4.0 3.9 4.1  CL 99 102 101 106 103  CO2 25 24 24 24 25   GLUCOSE 102* 110* 85 99 98  BUN 16 11 7 7 8   CREATININE 1.40* 1.11 0.97 0.94 1.02  CALCIUM 9.6 8.3* 8.8 8.7 9.2  MG 1.2*  --   --   --   --    Liver Function Tests:  Recent Labs Lab 02/16/12 0230  AST 26  ALT 19  ALKPHOS 44  BILITOT 1.4*  PROT 7.1  ALBUMIN 3.9   CBC:  Recent Labs Lab 02/16/12 0230 02/17/12 0530 02/17/12 1510 02/18/12 0433 02/19/12 0433 02/20/12 0454  WBC 19.5* 20.7* 19.6* 17.2* 12.7* 10.2  NEUTROABS 17.4*  --   --   --   --   --   HGB 14.0 12.2* 12.0* 11.6* 11.7* 12.3*  HCT 41.5 36.4* 36.0* 35.5* 35.2* 37.2*  MCV 89.2 91.7 91.1 91.3 90.3 89.6  PLT 150 126* 109* 111* 139* 148*   BNP: BNP (last 3 results)  Recent Labs  02/16/12 0230  PROBNP 149.3*     Signed:  KRISHNAN,SENDIL K  [facility] 02/20/2012, 10:09 AM

## 2012-02-20 NOTE — Progress Notes (Signed)
PICC removed - 53 cms.  Pt and wife instructed to not get site wet for 24 hours, remove dsg in 24 hours, not to scratch scab off, to place bandaide over site if want to.  If bleeding occurs to keep pressure on area for 5 minutes and if still bleeding to go to nearest ED, if red stricks or bruising occurs to call MD.  Both were able to verbalize instructions.

## 2012-02-21 LAB — CULTURE, BLOOD (ROUTINE X 2)

## 2012-02-22 NOTE — Progress Notes (Signed)
Pt discharged with instructions, and care notes.  He was instructed to pick his prescription of from his pharmacy.  Pt site where his PICC line was began to bleed prior to discharged.  The site was cleansed, Vaseline gauze was replaced, and a pressure dressing was replaced.  He was made aware of the same instructions to leave the dressing in place for 24 hours applied. He and his wife verbalized understanding.  Pt left the floor via w/c with staff and family in stable condition.  He waited approximately 30 minutes before leaving after his dressing was redressed. No bleeding wa noted at time of discharge.

## 2012-03-01 DIAGNOSIS — I890 Lymphedema, not elsewhere classified: Secondary | ICD-10-CM | POA: Diagnosis not present

## 2012-03-01 DIAGNOSIS — R269 Unspecified abnormalities of gait and mobility: Secondary | ICD-10-CM | POA: Diagnosis not present

## 2012-03-01 DIAGNOSIS — Z5189 Encounter for other specified aftercare: Secondary | ICD-10-CM | POA: Diagnosis not present

## 2012-03-01 DIAGNOSIS — M6281 Muscle weakness (generalized): Secondary | ICD-10-CM | POA: Diagnosis not present

## 2012-03-01 DIAGNOSIS — R262 Difficulty in walking, not elsewhere classified: Secondary | ICD-10-CM | POA: Diagnosis not present

## 2012-03-08 ENCOUNTER — Inpatient Hospital Stay: Payer: Medicare Other | Admitting: Family Medicine

## 2012-03-16 ENCOUNTER — Encounter: Payer: Self-pay | Admitting: Family Medicine

## 2012-03-16 ENCOUNTER — Ambulatory Visit (INDEPENDENT_AMBULATORY_CARE_PROVIDER_SITE_OTHER): Payer: Medicare Other | Admitting: Family Medicine

## 2012-03-16 VITALS — BP 121/86 | HR 94 | Ht 71.0 in | Wt 329.0 lb

## 2012-03-16 DIAGNOSIS — M25519 Pain in unspecified shoulder: Secondary | ICD-10-CM | POA: Insufficient documentation

## 2012-03-16 DIAGNOSIS — L03116 Cellulitis of left lower limb: Secondary | ICD-10-CM

## 2012-03-16 DIAGNOSIS — L02419 Cutaneous abscess of limb, unspecified: Secondary | ICD-10-CM | POA: Diagnosis not present

## 2012-03-16 DIAGNOSIS — E669 Obesity, unspecified: Secondary | ICD-10-CM | POA: Diagnosis not present

## 2012-03-16 DIAGNOSIS — I890 Lymphedema, not elsewhere classified: Secondary | ICD-10-CM | POA: Diagnosis not present

## 2012-03-16 DIAGNOSIS — I10 Essential (primary) hypertension: Secondary | ICD-10-CM

## 2012-03-16 DIAGNOSIS — M25511 Pain in right shoulder: Secondary | ICD-10-CM

## 2012-03-16 MED ORDER — IBUPROFEN 600 MG PO TABS: 600.0000 mg | ORAL_TABLET | Freq: Three times a day (TID) | ORAL | Status: DC | PRN

## 2012-03-16 MED ORDER — FUROSEMIDE 20 MG PO TABS: 20.0000 mg | ORAL_TABLET | Freq: Every day | ORAL | Status: DC | PRN

## 2012-03-16 NOTE — Assessment & Plan Note (Addendum)
Rotator cuff tendonopathy. Pt to avoid sleeping on shoulder.  Exercises given Ibuprofen 600mg  Q6 for 2 wks If not improviing will consider Steroid injection

## 2012-03-16 NOTE — Patient Instructions (Addendum)
Devon you are doing great Remember to use neosporin or bacitracin ointment any time you get a scratch on your legs/feet Please start doing the exercises outlined below.  Please start taking ibuprofen 600mg  every 6 hours for the next 2 weeks Try to avoid sleeping on your right shoulder If your pain does not improve then we can do an injection Continue with the physical therapists and and the lymphedema clinic.   Impingement Syndrome, Rotator Cuff, Bursitis with Rehab Impingement syndrome is a condition that involves inflammation of the tendons of the rotator cuff and the subacromial bursa, that causes pain in the shoulder. The rotator cuff consists of four tendons and muscles that control much of the shoulder and upper arm function. The subacromial bursa is a fluid filled sac that helps reduce friction between the rotator cuff and one of the bones of the shoulder (acromion). Impingement syndrome is usually an overuse injury that causes swelling of the bursa (bursitis), swelling of the tendon (tendonitis), and/or a tear of the tendon (strain). Strains are classified into three categories. Grade 1 strains cause pain, but the tendon is not lengthened. Grade 2 strains include a lengthened ligament, due to the ligament being stretched or partially ruptured. With grade 2 strains there is still function, although the function may be decreased. Grade 3 strains include a complete tear of the tendon or muscle, and function is usually impaired. SYMPTOMS   Pain around the shoulder, often at the outer portion of the upper arm.  Pain that gets worse with shoulder function, especially when reaching overhead or lifting.  Sometimes, aching when not using the arm.  Pain that wakes you up at night.  Sometimes, tenderness, swelling, warmth, or redness over the affected area.  Loss of strength.  Limited motion of the shoulder, especially reaching behind the back (to the back pocket or to unhook bra) or across  your body.  Crackling sound (crepitation) when moving the arm.  Biceps tendon pain and inflammation (in the front of the shoulder). Worse when bending the elbow or lifting. CAUSES  Impingement syndrome is often an overuse injury, in which chronic (repetitive) motions cause the tendons or bursa to become inflamed. A strain occurs when a force is paced on the tendon or muscle that is greater than it can withstand. Common mechanisms of injury include: Stress from sudden increase in duration, frequency, or intensity of training.  Direct hit (trauma) to the shoulder.  Aging, erosion of the tendon with normal use.  Bony bump on shoulder (acromial spur). RISK INCREASES WITH:  Contact sports (football, wrestling, boxing).  Throwing sports (baseball, tennis, volleyball).  Weightlifting and bodybuilding.  Heavy labor.  Previous injury to the rotator cuff, including impingement.  Poor shoulder strength and flexibility.  Failure to warm up properly before activity.  Inadequate protective equipment.  Old age.  Bony bump on shoulder (acromial spur). PREVENTION   Warm up and stretch properly before activity.  Allow for adequate recovery between workouts.  Maintain physical fitness:  Strength, flexibility, and endurance.  Cardiovascular fitness.  Learn and use proper exercise technique. PROGNOSIS  If treated properly, impingement syndrome usually goes away within 6 weeks. Sometimes surgery is required.  RELATED COMPLICATIONS   Longer healing time if not properly treated, or if not given enough time to heal.  Recurring symptoms, that result in a chronic condition.  Shoulder stiffness, frozen shoulder, or loss of motion.  Rotator cuff tendon tear.  Recurring symptoms, especially if activity is resumed too soon, with overuse,  with a direct blow, or when using poor technique. TREATMENT  Treatment first involves the use of ice and medicine, to reduce pain and inflammation.  The use of strengthening and stretching exercises may help reduce pain with activity. These exercises may be performed at home or with a therapist. If non-surgical treatment is unsuccessful after more than 6 months, surgery may be advised. After surgery and rehabilitation, activity is usually possible in 3 months.  MEDICATION  If pain medicine is needed, nonsteroidal anti-inflammatory medicines (aspirin and ibuprofen), or other minor pain relievers (acetaminophen), are often advised.  Do not take pain medicine for 7 days before surgery.  Prescription pain relievers may be given, if your caregiver thinks they are needed. Use only as directed and only as much as you need.  Corticosteroid injections may be given by your caregiver. These injections should be reserved for the most serious cases, because they may only be given a certain number of times. HEAT AND COLD  Cold treatment (icing) should be applied for 10 to 15 minutes every 2 to 3 hours for inflammation and pain, and immediately after activity that aggravates your symptoms. Use ice packs or an ice massage.  Heat treatment may be used before performing stretching and strengthening activities prescribed by your caregiver, physical therapist, or athletic trainer. Use a heat pack or a warm water soak. SEEK MEDICAL CARE IF:   Symptoms get worse or do not improve in 4 to 6 weeks, despite treatment.  New, unexplained symptoms develop. (Drugs used in treatment may produce side effects.) EXERCISES  RANGE OF MOTION (ROM) AND STRETCHING EXERCISES - Impingement Syndrome (Rotator Cuff  Tendinitis, Bursitis) These exercises may help you when beginning to rehabilitate your injury. Your symptoms may go away with or without further involvement from your physician, physical therapist or athletic trainer. While completing these exercises, remember:   Restoring tissue flexibility helps normal motion to return to the joints. This allows healthier, less  painful movement and activity.  An effective stretch should be held for at least 30 seconds.  A stretch should never be painful. You should only feel a gentle lengthening or release in the stretched tissue. STRETCH  Flexion, Standing  Stand with good posture. With an underhand grip on your right / left hand, and an overhand grip on the opposite hand, grasp a broomstick or cane so that your hands are a little more than shoulder width apart.  Keeping your right / left elbow straight and shoulder muscles relaxed, push the stick with your opposite hand, to raise your right / left arm in front of your body and then overhead. Raise your arm until you feel a stretch in your right / left shoulder, but before you have increased shoulder pain.  Try to avoid shrugging your right / left shoulder as your arm rises, by keeping your shoulder blade tucked down and toward your mid-back spine. Hold for __________ seconds.  Slowly return to the starting position. Repeat __________ times. Complete this exercise __________ times per day. STRETCH  Abduction, Supine  Lie on your back. With an underhand grip on your right / left hand and an overhand grip on the opposite hand, grasp a broomstick or cane so that your hands are a little more than shoulder width apart.  Keeping your right / left elbow straight and your shoulder muscles relaxed, push the stick with your opposite hand, to raise your right / left arm out to the side of your body and then overhead. Raise your  arm until you feel a stretch in your right / left shoulder, but before you have increased shoulder pain.  Try to avoid shrugging your right / left shoulder as your arm rises, by keeping your shoulder blade tucked down and toward your mid-back spine. Hold for __________ seconds.  Slowly return to the starting position. Repeat __________ times. Complete this exercise __________ times per day. ROM  Flexion, Active-Assisted  Lie on your back. You may  bend your knees for comfort.  Grasp a broomstick or cane so your hands are about shoulder width apart. Your right / left hand should grip the end of the stick, so that your hand is positioned "thumbs-up," as if you were about to shake hands.  Using your healthy arm to lead, raise your right / left arm overhead, until you feel a gentle stretch in your shoulder. Hold for __________ seconds.  Use the stick to assist in returning your right / left arm to its starting position. Repeat __________ times. Complete this exercise __________ times per day.  ROM - Internal Rotation, Supine   Lie on your back on a firm surface. Place your right / left elbow about 60 degrees away from your side. Elevate your elbow with a folded towel, so that the elbow and shoulder are the same height.  Using a broomstick or cane and your strong arm, pull your right / left hand toward your body until you feel a gentle stretch, but no increase in your shoulder pain. Keep your shoulder and elbow in place throughout the exercise.  Hold for __________ seconds. Slowly return to the starting position. Repeat __________ times. Complete this exercise __________ times per day. STRETCH - Internal Rotation  Place your right / left hand behind your back, palm up.  Throw a towel or belt over your opposite shoulder. Grasp the towel with your right / left hand.  While keeping an upright posture, gently pull up on the towel, until you feel a stretch in the front of your right / left shoulder.  Avoid shrugging your right / left shoulder as your arm rises, by keeping your shoulder blade tucked down and toward your mid-back spine.  Hold for __________ seconds. Release the stretch, by lowering your healthy hand. Repeat __________ times. Complete this exercise __________ times per day. ROM - Internal Rotation   Using an underhand grip, grasp a stick behind your back with both hands.  While standing upright with good posture, slide the  stick up your back until you feel a mild stretch in the front of your shoulder.  Hold for __________ seconds. Slowly return to your starting position. Repeat __________ times. Complete this exercise __________ times per day.  STRETCH  Posterior Shoulder Capsule   Stand or sit with good posture. Grasp your right / left elbow and draw it across your chest, keeping it at the same height as your shoulder.  Pull your elbow, so your upper arm comes in closer to your chest. Pull until you feel a gentle stretch in the back of your shoulder.  Hold for __________ seconds. Repeat __________ times. Complete this exercise __________ times per day. STRENGTHENING EXERCISES - Impingement Syndrome (Rotator Cuff Tendinitis, Bursitis) These exercises may help you when beginning to rehabilitate your injury. They may resolve your symptoms with or without further involvement from your physician, physical therapist or athletic trainer. While completing these exercises, remember:  Muscles can gain both the endurance and the strength needed for everyday activities through controlled exercises.  Complete  these exercises as instructed by your physician, physical therapist or athletic trainer. Increase the resistance and repetitions only as guided.  You may experience muscle soreness or fatigue, but the pain or discomfort you are trying to eliminate should never worsen during these exercises. If this pain does get worse, stop and make sure you are following the directions exactly. If the pain is still present after adjustments, discontinue the exercise until you can discuss the trouble with your clinician.  During your recovery, avoid activity or exercises which involve actions that place your injured hand or elbow above your head or behind your back or head. These positions stress the tissues which you are trying to heal. STRENGTH - Scapular Depression and Adduction   With good posture, sit on a firm chair. Support  your arms in front of you, with pillows, arm rests, or on a table top. Have your elbows in line with the sides of your body.  Gently draw your shoulder blades down and toward your mid-back spine. Gradually increase the tension, without tensing the muscles along the top of your shoulders and the back of your neck.  Hold for __________ seconds. Slowly release the tension and relax your muscles completely before starting the next repetition.  After you have practiced this exercise, remove the arm support and complete the exercise in standing as well as sitting position. Repeat __________ times. Complete this exercise __________ times per day.  STRENGTH - Shoulder Abductors, Isometric  With good posture, stand or sit about 4-6 inches from a wall, with your right / left side facing the wall.  Bend your right / left elbow. Gently press your right / left elbow into the wall. Increase the pressure gradually, until you are pressing as hard as you can, without shrugging your shoulder or increasing any shoulder discomfort.  Hold for __________ seconds.  Release the tension slowly. Relax your shoulder muscles completely before you begin the next repetition. Repeat __________ times. Complete this exercise __________ times per day.  STRENGTH - External Rotators, Isometric  Keep your right / left elbow at your side and bend it 90 degrees.  Step into a door frame so that the outside of your right / left wrist can press against the door frame without your upper arm leaving your side.  Gently press your right / left wrist into the door frame, as if you were trying to swing the back of your hand away from your stomach. Gradually increase the tension, until you are pressing as hard as you can, without shrugging your shoulder or increasing any shoulder discomfort.  Hold for __________ seconds.  Release the tension slowly. Relax your shoulder muscles completely before you begin the next repetition. Repeat  __________ times. Complete this exercise __________ times per day.  STRENGTH - Supraspinatus   Stand or sit with good posture. Grasp a __________ weight, or an exercise band or tubing, so that your hand is "thumbs-up," like you are shaking hands.  Slowly lift your right / left arm in a "V" away from your thigh, diagonally into the space between your side and straight ahead. Lift your hand to shoulder height or as far as you can, without increasing any shoulder pain. At first, many people do not lift their hands above shoulder height.  Avoid shrugging your right / left shoulder as your arm rises, by keeping your shoulder blade tucked down and toward your mid-back spine.  Hold for __________ seconds. Control the descent of your hand, as you slowly  return to your starting position. Repeat __________ times. Complete this exercise __________ times per day.  STRENGTH - External Rotators  Secure a rubber exercise band or tubing to a fixed object (table, pole) so that it is at the same height as your right / left elbow when you are standing or sitting on a firm surface.  Stand or sit so that the secured exercise band is at your uninjured side.  Bend your right / left elbow 90 degrees. Place a folded towel or small pillow under your right / left arm, so that your elbow is a few inches away from your side.  Keeping the tension on the exercise band, pull it away from your body, as if pivoting on your elbow. Be sure to keep your body steady, so that the movement is coming only from your rotating shoulder.  Hold for __________ seconds. Release the tension in a controlled manner, as you return to the starting position. Repeat __________ times. Complete this exercise __________ times per day.  STRENGTH - Internal Rotators   Secure a rubber exercise band or tubing to a fixed object (table, pole) so that it is at the same height as your right / left elbow when you are standing or sitting on a firm  surface.  Stand or sit so that the secured exercise band is at your right / left side.  Bend your elbow 90 degrees. Place a folded towel or small pillow under your right / left arm so that your elbow is a few inches away from your side.  Keeping the tension on the exercise band, pull it across your body, toward your stomach. Be sure to keep your body steady, so that the movement is coming only from your rotating shoulder.  Hold for __________ seconds. Release the tension in a controlled manner, as you return to the starting position. Repeat __________ times. Complete this exercise __________ times per day.  STRENGTH - Scapular Protractors, Standing   Stand arms length away from a wall. Place your hands on the wall, keeping your elbows straight.  Begin by dropping your shoulder blades down and toward your mid-back spine.  To strengthen your protractors, keep your shoulder blades down, but slide them forward on your rib cage. It will feel as if you are lifting the back of your rib cage away from the wall. This is a subtle motion and can be challenging to complete. Ask your caregiver for further instruction, if you are not sure you are doing the exercise correctly.  Hold for __________ seconds. Slowly return to the starting position, resting the muscles completely before starting the next repetition. Repeat __________ times. Complete this exercise __________ times per day. STRENGTH - Scapular Protractors, Supine  Lie on your back on a firm surface. Extend your right / left arm straight into the air while holding a __________ weight in your hand.  Keeping your head and back in place, lift your shoulder off the floor.  Hold for __________ seconds. Slowly return to the starting position, and allow your muscles to relax completely before starting the next repetition. Repeat __________ times. Complete this exercise __________ times per day. STRENGTH - Scapular Protractors, Quadruped  Get onto  your hands and knees, with your shoulders directly over your hands (or as close as you can be, comfortably).  Keeping your elbows locked, lift the back of your rib cage up into your shoulder blades, so your mid-back rounds out. Keep your neck muscles relaxed.  Hold this position  for __________ seconds. Slowly return to the starting position and allow your muscles to relax completely before starting the next repetition. Repeat __________ times. Complete this exercise __________ times per day.  STRENGTH - Scapular Retractors  Secure a rubber exercise band or tubing to a fixed object (table, pole), so that it is at the height of your shoulders when you are either standing, or sitting on a firm armless chair.  With a palm down grip, grasp an end of the band in each hand. Straighten your elbows and lift your hands straight in front of you, at shoulder height. Step back, away from the secured end of the band, until it becomes tense.  Squeezing your shoulder blades together, draw your elbows back toward your sides, as you bend them. Keep your upper arms lifted away from your body throughout the exercise.  Hold for __________ seconds. Slowly ease the tension on the band, as you reverse the directions and return to the starting position. Repeat __________ times. Complete this exercise __________ times per day. STRENGTH - Shoulder Extensors   Secure a rubber exercise band or tubing to a fixed object (table, pole) so that it is at the height of your shoulders when you are either standing, or sitting on a firm armless chair.  With a thumbs-up grip, grasp an end of the band in each hand. Straighten your elbows and lift your hands straight in front of you, at shoulder height. Step back, away from the secured end of the band, until it becomes tense.  Squeezing your shoulder blades together, pull your hands down to the sides of your thighs. Do not allow your hands to go behind you.  Hold for __________  seconds. Slowly ease the tension on the band, as you reverse the directions and return to the starting position. Repeat __________ times. Complete this exercise __________ times per day.  STRENGTH - Scapular Retractors and External Rotators   Secure a rubber exercise band or tubing to a fixed object (table, pole) so that it is at the height as your shoulders, when you are either standing, or sitting on a firm armless chair.  With a palm down grip, grasp an end of the band in each hand. Bend your elbows 90 degrees and lift your elbows to shoulder height, at your sides. Step back, away from the secured end of the band, until it becomes tense.  Squeezing your shoulder blades together, rotate your shoulders so that your upper arms and elbows remain stationary, but your fists travel upward to head height.  Hold for __________ seconds. Slowly ease the tension on the band, as you reverse the directions and return to the starting position. Repeat __________ times. Complete this exercise __________ times per day.  STRENGTH - Scapular Retractors and External Rotators, Rowing   Secure a rubber exercise band or tubing to a fixed object (table, pole) so that it is at the height of your shoulders, when you are either standing, or sitting on a firm armless chair.  With a palm down grip, grasp an end of the band in each hand. Straighten your elbows and lift your hands straight in front of you, at shoulder height. Step back, away from the secured end of the band, until it becomes tense.  Step 1: Squeeze your shoulder blades together. Bending your elbows, draw your hands to your chest, as if you are rowing a boat. At the end of this motion, your hands and elbow should be at shoulder height and your  elbows should be out to your sides.  Step 2: Rotate your shoulders, to raise your hands above your head. Your forearms should be vertical and your upper arms should be horizontal.  Hold for __________ seconds. Slowly  ease the tension on the band, as you reverse the directions and return to the starting position. Repeat __________ times. Complete this exercise __________ times per day.  STRENGTH  Scapular Depressors  Find a sturdy chair without wheels, such as a dining room chair.  Keeping your feet on the floor, and your hands on the chair arms, lift your bottom up from the seat, and lock your elbows.  Keeping your elbows straight, allow gravity to pull your body weight down. Your shoulders will rise toward your ears.  Raise your body against gravity by drawing your shoulder blades down your back, shortening the distance between your shoulders and ears. Although your feet should always maintain contact with the floor, your feet should progressively support less body weight, as you get stronger.  Hold for __________ seconds. In a controlled and slow manner, lower your body weight to begin the next repetition. Repeat __________ times. Complete this exercise __________ times per day.  Document Released: 12/23/2004 Document Revised: 03/17/2011 Document Reviewed: 04/06/2008 [facility] Patient Information 2013 [place], [place].

## 2012-03-17 DIAGNOSIS — R262 Difficulty in walking, not elsewhere classified: Secondary | ICD-10-CM | POA: Diagnosis not present

## 2012-03-17 DIAGNOSIS — Z5189 Encounter for other specified aftercare: Secondary | ICD-10-CM | POA: Diagnosis not present

## 2012-03-17 DIAGNOSIS — I890 Lymphedema, not elsewhere classified: Secondary | ICD-10-CM | POA: Diagnosis not present

## 2012-03-17 DIAGNOSIS — Z87828 Personal history of other (healed) physical injury and trauma: Secondary | ICD-10-CM | POA: Diagnosis not present

## 2012-03-17 DIAGNOSIS — M6281 Muscle weakness (generalized): Secondary | ICD-10-CM | POA: Diagnosis not present

## 2012-03-17 DIAGNOSIS — R269 Unspecified abnormalities of gait and mobility: Secondary | ICD-10-CM | POA: Diagnosis not present

## 2012-03-17 NOTE — Assessment & Plan Note (Signed)
Significant wt loss.  Continue diet and exercise as tolerated.  Limited secondary to nerve damage Continue meeting w/ health coach as desired.

## 2012-03-17 NOTE — Assessment & Plan Note (Signed)
Continue w/ lymphedema therapy.  Marked improvement

## 2012-03-17 NOTE — Assessment & Plan Note (Signed)
Resolved for now Pt very susceptible to cellulitis of the LE. Multiple admissions. Significant lymphedema preventing adequate blood flow and lcearance of bacteria Pt to use OTC antibiotic ointment routinely for even minor cuts and scrapes.

## 2012-03-17 NOTE — Assessment & Plan Note (Signed)
At goal. No need for therapy.  Lasix PRN for LE edema.
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
## 2012-03-17 NOTE — Assessment & Plan Note (Signed)
Completely resolved after operative intervention No further intervention

## 2012-03-19 DIAGNOSIS — I890 Lymphedema, not elsewhere classified: Secondary | ICD-10-CM | POA: Diagnosis not present

## 2012-03-19 DIAGNOSIS — Z5189 Encounter for other specified aftercare: Secondary | ICD-10-CM | POA: Diagnosis not present

## 2012-03-19 DIAGNOSIS — R269 Unspecified abnormalities of gait and mobility: Secondary | ICD-10-CM | POA: Diagnosis not present

## 2012-03-19 DIAGNOSIS — Z87828 Personal history of other (healed) physical injury and trauma: Secondary | ICD-10-CM | POA: Diagnosis not present

## 2012-03-19 DIAGNOSIS — R262 Difficulty in walking, not elsewhere classified: Secondary | ICD-10-CM | POA: Diagnosis not present

## 2012-03-19 DIAGNOSIS — M6281 Muscle weakness (generalized): Secondary | ICD-10-CM | POA: Diagnosis not present

## 2012-03-22 DIAGNOSIS — R262 Difficulty in walking, not elsewhere classified: Secondary | ICD-10-CM | POA: Diagnosis not present

## 2012-03-22 DIAGNOSIS — Z87828 Personal history of other (healed) physical injury and trauma: Secondary | ICD-10-CM | POA: Diagnosis not present

## 2012-03-22 DIAGNOSIS — I890 Lymphedema, not elsewhere classified: Secondary | ICD-10-CM | POA: Diagnosis not present

## 2012-03-22 DIAGNOSIS — R269 Unspecified abnormalities of gait and mobility: Secondary | ICD-10-CM | POA: Diagnosis not present

## 2012-03-22 DIAGNOSIS — Z5189 Encounter for other specified aftercare: Secondary | ICD-10-CM | POA: Diagnosis not present

## 2012-03-22 DIAGNOSIS — M6281 Muscle weakness (generalized): Secondary | ICD-10-CM | POA: Diagnosis not present

## 2012-03-24 DIAGNOSIS — R269 Unspecified abnormalities of gait and mobility: Secondary | ICD-10-CM | POA: Diagnosis not present

## 2012-03-24 DIAGNOSIS — Z87828 Personal history of other (healed) physical injury and trauma: Secondary | ICD-10-CM | POA: Diagnosis not present

## 2012-03-24 DIAGNOSIS — M6281 Muscle weakness (generalized): Secondary | ICD-10-CM | POA: Diagnosis not present

## 2012-03-24 DIAGNOSIS — Z5189 Encounter for other specified aftercare: Secondary | ICD-10-CM | POA: Diagnosis not present

## 2012-03-24 DIAGNOSIS — R262 Difficulty in walking, not elsewhere classified: Secondary | ICD-10-CM | POA: Diagnosis not present

## 2012-03-24 DIAGNOSIS — I890 Lymphedema, not elsewhere classified: Secondary | ICD-10-CM | POA: Diagnosis not present

## 2012-03-26 DIAGNOSIS — Z5189 Encounter for other specified aftercare: Secondary | ICD-10-CM | POA: Diagnosis not present

## 2012-03-26 DIAGNOSIS — Z87828 Personal history of other (healed) physical injury and trauma: Secondary | ICD-10-CM | POA: Diagnosis not present

## 2012-03-26 DIAGNOSIS — M6281 Muscle weakness (generalized): Secondary | ICD-10-CM | POA: Diagnosis not present

## 2012-03-26 DIAGNOSIS — R262 Difficulty in walking, not elsewhere classified: Secondary | ICD-10-CM | POA: Diagnosis not present

## 2012-03-26 DIAGNOSIS — I890 Lymphedema, not elsewhere classified: Secondary | ICD-10-CM | POA: Diagnosis not present

## 2012-03-26 DIAGNOSIS — R269 Unspecified abnormalities of gait and mobility: Secondary | ICD-10-CM | POA: Diagnosis not present

## 2012-03-29 ENCOUNTER — Telehealth: Payer: Self-pay | Admitting: Family Medicine

## 2012-03-29 NOTE — Telephone Encounter (Signed)
Mom is calling asking if another doctor can give the approval.

## 2012-03-29 NOTE — Telephone Encounter (Signed)
Pt is asking for a new script to get another set of crutches - they are wearing out and broken - needs a script sent to [facility], [place], [place]  Needs asap

## 2012-03-29 NOTE — Telephone Encounter (Signed)
To Dr. Merrell. Fleeger, Jessica Dawn  

## 2012-03-29 NOTE — Telephone Encounter (Signed)
Attending will have to write Rx (new guideline), but will still need to be approved by MD. Jordan Jensen is here tomorrow. [name], Maryjo Rochester

## 2012-03-30 DIAGNOSIS — R269 Unspecified abnormalities of gait and mobility: Secondary | ICD-10-CM | POA: Diagnosis not present

## 2012-03-30 DIAGNOSIS — Z87828 Personal history of other (healed) physical injury and trauma: Secondary | ICD-10-CM | POA: Diagnosis not present

## 2012-03-30 DIAGNOSIS — R262 Difficulty in walking, not elsewhere classified: Secondary | ICD-10-CM | POA: Diagnosis not present

## 2012-03-30 DIAGNOSIS — M6281 Muscle weakness (generalized): Secondary | ICD-10-CM | POA: Diagnosis not present

## 2012-03-30 DIAGNOSIS — I890 Lymphedema, not elsewhere classified: Secondary | ICD-10-CM | POA: Diagnosis not present

## 2012-03-30 DIAGNOSIS — Z5189 Encounter for other specified aftercare: Secondary | ICD-10-CM | POA: Diagnosis not present

## 2012-03-30 NOTE — Telephone Encounter (Signed)
approved

## 2012-03-31 ENCOUNTER — Other Ambulatory Visit: Payer: Self-pay | Admitting: Family Medicine

## 2012-03-31 DIAGNOSIS — G9589 Other specified diseases of spinal cord: Secondary | ICD-10-CM

## 2012-03-31 NOTE — Telephone Encounter (Signed)
Called and informed patient that Rx for crutches was faxed to his pharmacy.  Gaylene Brooks, RN

## 2012-03-31 NOTE — Progress Notes (Signed)
Order for replacement crutches was handwritten

## 2012-04-01 ENCOUNTER — Telehealth: Payer: Self-pay | Admitting: Home Health Services

## 2012-04-01 NOTE — Telephone Encounter (Signed)
Thanks for the note and help.  Will discuss at our next appt

## 2012-04-01 NOTE — Telephone Encounter (Signed)
Spoke with Jordan Jensen  Pt reports having met and exceed all PT goals and completed PT this week.  Pt reports still focusing on diet, avoiding fast food and trying to limit fried foods.  Pt set goal to continue with diet and to look into joining a gym for regular exercise routine.  Will follow up next week to discuss his physical activity goal.  Pt's overall goal is weight loss.

## 2012-04-02 ENCOUNTER — Encounter (HOSPITAL_COMMUNITY): Payer: Self-pay | Admitting: *Deleted

## 2012-04-02 ENCOUNTER — Emergency Department (HOSPITAL_COMMUNITY)
Admission: EM | Admit: 2012-04-02 | Discharge: 2012-04-02 | Disposition: A | Discharge status: Home / Self Care | Payer: Medicare Other | Attending: Emergency Medicine | Admitting: Emergency Medicine

## 2012-04-02 DIAGNOSIS — R42 Dizziness and giddiness: Secondary | ICD-10-CM | POA: Diagnosis not present

## 2012-04-02 DIAGNOSIS — R112 Nausea with vomiting, unspecified: Secondary | ICD-10-CM | POA: Insufficient documentation

## 2012-04-02 DIAGNOSIS — Z87828 Personal history of other (healed) physical injury and trauma: Secondary | ICD-10-CM | POA: Diagnosis not present

## 2012-04-02 DIAGNOSIS — R269 Unspecified abnormalities of gait and mobility: Secondary | ICD-10-CM | POA: Diagnosis not present

## 2012-04-02 DIAGNOSIS — M545 Low back pain, unspecified: Secondary | ICD-10-CM | POA: Diagnosis not present

## 2012-04-02 DIAGNOSIS — Z5189 Encounter for other specified aftercare: Secondary | ICD-10-CM | POA: Diagnosis not present

## 2012-04-02 DIAGNOSIS — Z8739 Personal history of other diseases of the musculoskeletal system and connective tissue: Secondary | ICD-10-CM | POA: Insufficient documentation

## 2012-04-02 DIAGNOSIS — R51 Headache: Secondary | ICD-10-CM | POA: Insufficient documentation

## 2012-04-02 DIAGNOSIS — Z9889 Other specified postprocedural states: Secondary | ICD-10-CM | POA: Insufficient documentation

## 2012-04-02 DIAGNOSIS — N390 Urinary tract infection, site not specified: Secondary | ICD-10-CM

## 2012-04-02 DIAGNOSIS — Z981 Arthrodesis status: Secondary | ICD-10-CM | POA: Diagnosis not present

## 2012-04-02 DIAGNOSIS — M6281 Muscle weakness (generalized): Secondary | ICD-10-CM | POA: Diagnosis not present

## 2012-04-02 DIAGNOSIS — Z87891 Personal history of nicotine dependence: Secondary | ICD-10-CM | POA: Insufficient documentation

## 2012-04-02 DIAGNOSIS — R262 Difficulty in walking, not elsewhere classified: Secondary | ICD-10-CM | POA: Diagnosis not present

## 2012-04-02 DIAGNOSIS — I10 Essential (primary) hypertension: Secondary | ICD-10-CM | POA: Insufficient documentation

## 2012-04-02 DIAGNOSIS — I890 Lymphedema, not elsewhere classified: Secondary | ICD-10-CM | POA: Diagnosis not present

## 2012-04-02 LAB — URINALYSIS, ROUTINE W REFLEX MICROSCOPIC
Bilirubin Urine: NEGATIVE
Ketones, ur: NEGATIVE mg/dL
Leukocytes, UA: NEGATIVE
Nitrite: NEGATIVE
Specific Gravity, Urine: 1.02 (ref 1.005–1.030)
Urobilinogen, UA: 1 mg/dL (ref 0.0–1.0)
pH: 7 (ref 5.0–8.0)

## 2012-04-02 MED ORDER — CEPHALEXIN 500 MG PO CAPS: 500.0000 mg | ORAL_CAPSULE | Freq: Four times a day (QID) | ORAL | Status: DC

## 2012-04-02 MED ORDER — CEPHALEXIN 500 MG PO CAPS
500.0000 mg | ORAL_CAPSULE | Freq: Once | ORAL | Status: AC
  Administered 2012-04-02: 500 mg via ORAL
  Filled 2012-04-02: qty 1
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
## 2012-04-02 NOTE — ED Notes (Addendum)
Rt low back pain , onset today, Urine darker than normal, Headache.  Getting PT for swelling of legs at [facility]

## 2012-04-04 LAB — URINE CULTURE: Colony Count: 1000

## 2012-04-07 DIAGNOSIS — I890 Lymphedema, not elsewhere classified: Secondary | ICD-10-CM | POA: Diagnosis not present

## 2012-04-07 DIAGNOSIS — IMO0001 Reserved for inherently not codable concepts without codable children: Secondary | ICD-10-CM | POA: Diagnosis not present

## 2012-04-08 ENCOUNTER — Telehealth: Payer: Self-pay | Admitting: Home Health Services

## 2012-04-08 NOTE — Telephone Encounter (Signed)
Spoke with Oney.  Pt reports feeling better from UTI.  Went to ED last Friday.  Pt had no problems with keflex.  Pt reports continuing with diet.   Pt picked up [facility] application yesterday and is planing on joining today.  Pt set goal to exercise 3 times a week.  Pt's overall goal is weight loss.

## 2012-04-15 ENCOUNTER — Telehealth: Payer: Self-pay | Admitting: Home Health Services

## 2012-04-15 NOTE — Telephone Encounter (Signed)
Spoke with Jordan Jensen Pt reports feeling: good Pt reports taking medications yes Patient missed taking medications 0 days this week.   Last weeks goals:Exercise 2-3 and follow low carb diet Pt was successful with last week's goals: yes  Went to gym 2 x,  This weeks goals: exercise 2-3 times (joing [employer]), refocus on not eating out and less carbohydrates. Pt's overall goal is: weight loss

## 2012-04-23 ENCOUNTER — Encounter (HOSPITAL_COMMUNITY): Payer: Self-pay

## 2012-04-23 ENCOUNTER — Emergency Department (HOSPITAL_COMMUNITY): Payer: Medicare Other

## 2012-04-23 ENCOUNTER — Inpatient Hospital Stay (HOSPITAL_COMMUNITY)
Admission: EM | Admit: 2012-04-23 | Discharge: 2012-04-25 | DRG: 872 | Disposition: A | Discharge status: Home / Self Care | Payer: Medicare Other | Attending: Internal Medicine | Admitting: Internal Medicine

## 2012-04-23 DIAGNOSIS — Z981 Arthrodesis status: Secondary | ICD-10-CM | POA: Diagnosis not present

## 2012-04-23 DIAGNOSIS — IMO0002 Reserved for concepts with insufficient information to code with codable children: Secondary | ICD-10-CM

## 2012-04-23 DIAGNOSIS — R0683 Snoring: Secondary | ICD-10-CM

## 2012-04-23 DIAGNOSIS — Z833 Family history of diabetes mellitus: Secondary | ICD-10-CM | POA: Diagnosis not present

## 2012-04-23 DIAGNOSIS — Z809 Family history of malignant neoplasm, unspecified: Secondary | ICD-10-CM

## 2012-04-23 DIAGNOSIS — R0989 Other specified symptoms and signs involving the circulatory and respiratory systems: Secondary | ICD-10-CM | POA: Diagnosis not present

## 2012-04-23 DIAGNOSIS — R6510 Systemic inflammatory response syndrome (SIRS) of non-infectious origin without acute organ dysfunction: Secondary | ICD-10-CM | POA: Diagnosis not present

## 2012-04-23 DIAGNOSIS — L02419 Cutaneous abscess of limb, unspecified: Secondary | ICD-10-CM | POA: Diagnosis not present

## 2012-04-23 DIAGNOSIS — A419 Sepsis, unspecified organism: Principal | ICD-10-CM | POA: Diagnosis present

## 2012-04-23 DIAGNOSIS — E860 Dehydration: Secondary | ICD-10-CM | POA: Diagnosis present

## 2012-04-23 DIAGNOSIS — L03119 Cellulitis of unspecified part of limb: Secondary | ICD-10-CM | POA: Diagnosis not present

## 2012-04-23 DIAGNOSIS — I890 Lymphedema, not elsewhere classified: Secondary | ICD-10-CM | POA: Diagnosis not present

## 2012-04-23 DIAGNOSIS — I872 Venous insufficiency (chronic) (peripheral): Secondary | ICD-10-CM | POA: Diagnosis present

## 2012-04-23 DIAGNOSIS — R6883 Chills (without fever): Secondary | ICD-10-CM

## 2012-04-23 DIAGNOSIS — Z79899 Other long term (current) drug therapy: Secondary | ICD-10-CM | POA: Diagnosis not present

## 2012-04-23 DIAGNOSIS — R509 Fever, unspecified: Secondary | ICD-10-CM | POA: Diagnosis not present

## 2012-04-23 DIAGNOSIS — Z87891 Personal history of nicotine dependence: Secondary | ICD-10-CM

## 2012-04-23 DIAGNOSIS — Z96649 Presence of unspecified artificial hip joint: Secondary | ICD-10-CM

## 2012-04-23 DIAGNOSIS — R52 Pain, unspecified: Secondary | ICD-10-CM | POA: Diagnosis not present

## 2012-04-23 DIAGNOSIS — G9589 Other specified diseases of spinal cord: Secondary | ICD-10-CM

## 2012-04-23 DIAGNOSIS — E669 Obesity, unspecified: Secondary | ICD-10-CM | POA: Diagnosis not present

## 2012-04-23 DIAGNOSIS — R600 Localized edema: Secondary | ICD-10-CM

## 2012-04-23 DIAGNOSIS — I10 Essential (primary) hypertension: Secondary | ICD-10-CM | POA: Diagnosis present

## 2012-04-23 DIAGNOSIS — S90415A Abrasion, left lesser toe(s), initial encounter: Secondary | ICD-10-CM

## 2012-04-23 DIAGNOSIS — Z6841 Body Mass Index (BMI) 40.0 and over, adult: Secondary | ICD-10-CM | POA: Diagnosis not present

## 2012-04-23 DIAGNOSIS — L03116 Cellulitis of left lower limb: Secondary | ICD-10-CM | POA: Diagnosis present

## 2012-04-23 DIAGNOSIS — E876 Hypokalemia: Secondary | ICD-10-CM

## 2012-04-23 LAB — URINE MICROSCOPIC-ADD ON

## 2012-04-23 LAB — URINALYSIS, ROUTINE W REFLEX MICROSCOPIC
Glucose, UA: NEGATIVE mg/dL
Leukocytes, UA: NEGATIVE
Protein, ur: NEGATIVE mg/dL
Specific Gravity, Urine: >1.03 — ABNORMAL HIGH (ref 1.005–1.030)
pH: 5.5 (ref 5.0–8.0)

## 2012-04-23 LAB — CBC WITH DIFFERENTIAL/PLATELET
Basophils Absolute: 0 10*3/uL (ref 0.0–0.1)
Lymphocytes Relative: 5 % — ABNORMAL LOW (ref 12–46)
Lymphs Abs: 0.8 10*3/uL (ref 0.7–4.0)
Neutro Abs: 13.8 10*3/uL — ABNORMAL HIGH (ref 1.7–7.7)
Neutrophils Relative %: 94 % — ABNORMAL HIGH (ref 43–77)
Platelets: 148 10*3/uL — ABNORMAL LOW (ref 150–400)
RBC: 4.88 MIL/uL (ref 4.22–5.81)
RDW: 12.8 % (ref 11.5–15.5)
WBC: 14.8 10*3/uL — ABNORMAL HIGH (ref 4.0–10.5)

## 2012-04-23 LAB — HEPATIC FUNCTION PANEL
Bilirubin, Direct: 0.2 mg/dL (ref 0.0–0.3)
Total Bilirubin: 0.8 mg/dL (ref 0.3–1.2)

## 2012-04-23 LAB — BASIC METABOLIC PANEL
CO2: 28 mEq/L (ref 19–32)
Calcium: 9.9 mg/dL (ref 8.4–10.5)
Chloride: 99 mEq/L (ref 96–112)
Potassium: 4.4 mEq/L (ref 3.5–5.1)
Sodium: 137 mEq/L (ref 135–145)

## 2012-04-23 MED ORDER — SODIUM CHLORIDE 0.9 % IV BOLUS (SEPSIS)
1000.0000 mL | Freq: Once | INTRAVENOUS | Status: AC
  Administered 2012-04-23: 1000 mL via INTRAVENOUS

## 2012-04-23 MED ORDER — DEXTROSE 5 % IV SOLN
2.0000 g | INTRAVENOUS | Status: DC
  Administered 2012-04-24 – 2012-04-25 (×2): 2 g via INTRAVENOUS
  Filled 2012-04-23 (×3): qty 2

## 2012-04-23 MED ORDER — HYDROMORPHONE HCL PF 1 MG/ML IJ SOLN
1.0000 mg | Freq: Once | INTRAMUSCULAR | Status: AC
  Administered 2012-04-23: 1 mg via INTRAVENOUS
  Filled 2012-04-23: qty 1

## 2012-04-23 MED ORDER — PANTOPRAZOLE SODIUM 40 MG PO TBEC
40.0000 mg | DELAYED_RELEASE_TABLET | Freq: Two times a day (BID) | ORAL | Status: DC
  Administered 2012-04-23 – 2012-04-25 (×5): 40 mg via ORAL
  Filled 2012-04-23 (×5): qty 1

## 2012-04-23 MED ORDER — TRAZODONE HCL 50 MG PO TABS
50.0000 mg | ORAL_TABLET | Freq: Every evening | ORAL | Status: DC | PRN
  Administered 2012-04-23 – 2012-04-24 (×2): 50 mg via ORAL
  Filled 2012-04-23 (×2): qty 1

## 2012-04-23 MED ORDER — IBUPROFEN 800 MG PO TABS
800.0000 mg | ORAL_TABLET | Freq: Once | ORAL | Status: AC
  Administered 2012-04-23: 800 mg via ORAL
  Filled 2012-04-23: qty 1

## 2012-04-23 MED ORDER — POTASSIUM CHLORIDE IN NACL 20-0.9 MEQ/L-% IV SOLN
INTRAVENOUS | Status: DC
  Administered 2012-04-23 – 2012-04-24 (×3): via INTRAVENOUS

## 2012-04-23 MED ORDER — ENOXAPARIN SODIUM 40 MG/0.4ML ~~LOC~~ SOLN
40.0000 mg | SUBCUTANEOUS | Status: DC
  Administered 2012-04-23 – 2012-04-25 (×3): 40 mg via SUBCUTANEOUS
  Filled 2012-04-23 (×3): qty 0.4

## 2012-04-23 MED ORDER — VANCOMYCIN HCL IN DEXTROSE 1-5 GM/200ML-% IV SOLN
1000.0000 mg | Freq: Once | INTRAVENOUS | Status: AC
  Administered 2012-04-23: 1000 mg via INTRAVENOUS
  Filled 2012-04-23 (×2): qty 200

## 2012-04-23 MED ORDER — CEFTRIAXONE SODIUM 1 G IJ SOLR
1.0000 g | Freq: Once | INTRAMUSCULAR | Status: DC
  Filled 2012-04-23: qty 10

## 2012-04-23 MED ORDER — ONDANSETRON HCL 4 MG/2ML IJ SOLN: 4.0000 mg | INTRAMUSCULAR | Status: DC | PRN

## 2012-04-23 MED ORDER — POLYETHYLENE GLYCOL 3350 17 G PO PACK: 17.0000 g | PACK | Freq: Every day | ORAL | Status: DC | PRN

## 2012-04-23 MED ORDER — IBUPROFEN 800 MG PO TABS
800.0000 mg | ORAL_TABLET | Freq: Three times a day (TID) | ORAL | Status: DC
  Administered 2012-04-23 – 2012-04-25 (×6): 800 mg via ORAL
  Filled 2012-04-23 (×6): qty 1

## 2012-04-23 MED ORDER — CEFTRIAXONE SODIUM 1 G IJ SOLR
1.0000 g | Freq: Once | INTRAMUSCULAR | Status: AC
  Administered 2012-04-23: 1 g via INTRAVENOUS
  Filled 2012-04-23: qty 10

## 2012-04-23 MED ORDER — ACETAMINOPHEN 325 MG PO TABS
650.0000 mg | ORAL_TABLET | ORAL | Status: DC | PRN
  Administered 2012-04-25: 650 mg via ORAL
  Filled 2012-04-23: qty 2

## 2012-04-23 MED ORDER — FLEET ENEMA 7-19 GM/118ML RE ENEM: 1.0000 | ENEMA | Freq: Once | RECTAL | Status: AC | PRN

## 2012-04-23 MED ORDER — SORBITOL 70 % SOLN: 30.0000 mL | Freq: Every day | Status: DC | PRN

## 2012-04-23 MED ORDER — ONDANSETRON HCL 4 MG/2ML IJ SOLN
4.0000 mg | Freq: Once | INTRAMUSCULAR | Status: AC
  Administered 2012-04-23: 4 mg via INTRAVENOUS
  Filled 2012-04-23: qty 2

## 2012-04-23 MED ORDER — SODIUM CHLORIDE 0.9 % IV SOLN
Freq: Once | INTRAVENOUS | Status: DC
  Administered 2012-04-23: 02:00:00 via INTRAVENOUS

## 2012-04-23 MED ORDER — ACETAMINOPHEN 500 MG PO TABS
1000.0000 mg | ORAL_TABLET | Freq: Once | ORAL | Status: AC
  Administered 2012-04-23: 1000 mg via ORAL
  Filled 2012-04-23: qty 2

## 2012-04-23 MED ORDER — VANCOMYCIN HCL IN DEXTROSE 1-5 GM/200ML-% IV SOLN
1000.0000 mg | Freq: Three times a day (TID) | INTRAVENOUS | Status: DC
  Administered 2012-04-23 – 2012-04-24 (×4): 1000 mg via INTRAVENOUS
  Filled 2012-04-23 (×10): qty 200

## 2012-04-23 NOTE — ED Notes (Signed)
Rocephin administration held to obtain blood cultures.

## 2012-04-23 NOTE — Progress Notes (Signed)
UR Chart Review Completed  
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
## 2012-04-23 NOTE — ED Notes (Signed)
Patient c/o chills starting last evening with lower back pain extending "through my joints into my left leg".

## 2012-04-23 NOTE — ED Notes (Addendum)
Abrasion noted to top of second toe on the left foot.

## 2012-04-23 NOTE — ED Notes (Signed)
Pt states he has had chills all day, feels like has fever, pain in left leg, and lower back pain
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
## 2012-04-23 NOTE — ED Notes (Signed)
Rocephin IVPB started.

## 2012-04-23 NOTE — Care Management Note (Unsigned)
    Page 1 of 1   04/23/2012     1:56:56 PM   CARE MANAGEMENT NOTE 04/23/2012  Patient:  Jordan Jensen, Jordan Jensen   Account Number:  [id number]  Date Initiated:  04/23/2012  Documentation initiated by:  Sharrie Rothman  Subjective/Objective Assessment:   Pt admitted from home with cellulitis. Pt lives with his wife and children. Pt has crutches, w/c for home use. Pts home is handicap accessible. Pt participates in PT, OT, and the lymphodema therapy at [facility].     Action/Plan:   No CM needs noted.   Anticipated DC Date:  04/27/2012   Anticipated DC Plan:  HOME/SELF CARE      DC Planning Services  CM consult      Choice offered to / List presented to:             Status of service:  Completed, signed off Medicare Important Message given?   (If response is "NO", the following Medicare IM given date fields will be blank) Date Medicare IM given:   Date Additional Medicare IM given:    Discharge Disposition:    Per UR Regulation:    If discussed at Long Length of Stay Meetings, dates discussed:    Comments:  04/23/12 1355 Arlyss Queen, RN BSN CM

## 2012-04-24 DIAGNOSIS — R509 Fever, unspecified: Secondary | ICD-10-CM

## 2012-04-24 DIAGNOSIS — L02419 Cutaneous abscess of limb, unspecified: Secondary | ICD-10-CM | POA: Diagnosis not present

## 2012-04-24 LAB — COMPREHENSIVE METABOLIC PANEL
ALT: 24 U/L (ref 0–53)
AST: 19 U/L (ref 0–37)
Albumin: 2.9 g/dL — ABNORMAL LOW (ref 3.5–5.2)
CO2: 26 mEq/L (ref 19–32)
Calcium: 8.5 mg/dL (ref 8.4–10.5)
Chloride: 103 mEq/L (ref 96–112)
GFR calc non Af Amer: 83 mL/min — ABNORMAL LOW (ref >90)
Sodium: 136 mEq/L (ref 135–145)

## 2012-04-24 LAB — CBC
MCH: 30.4 pg (ref 26.0–34.0)
Platelets: 135 10*3/uL — ABNORMAL LOW (ref 150–400)
RBC: 4.01 MIL/uL — ABNORMAL LOW (ref 4.22–5.81)
WBC: 16.7 10*3/uL — ABNORMAL HIGH (ref 4.0–10.5)

## 2012-04-24 MED ORDER — VANCOMYCIN HCL 10 G IV SOLR
1250.0000 mg | Freq: Three times a day (TID) | INTRAVENOUS | Status: DC
  Administered 2012-04-24 – 2012-04-25 (×3): 1250 mg via INTRAVENOUS
  Filled 2012-04-24 (×6): qty 1250
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
## 2012-04-25 DIAGNOSIS — I10 Essential (primary) hypertension: Secondary | ICD-10-CM | POA: Diagnosis not present

## 2012-04-25 DIAGNOSIS — L03119 Cellulitis of unspecified part of limb: Secondary | ICD-10-CM | POA: Diagnosis not present

## 2012-04-25 DIAGNOSIS — R6883 Chills (without fever): Secondary | ICD-10-CM

## 2012-04-25 DIAGNOSIS — R509 Fever, unspecified: Secondary | ICD-10-CM | POA: Diagnosis not present

## 2012-04-25 LAB — COMPREHENSIVE METABOLIC PANEL
AST: 18 U/L (ref 0–37)
BUN: 11 mg/dL (ref 6–23)
CO2: 24 mEq/L (ref 19–32)
Calcium: 8.9 mg/dL (ref 8.4–10.5)
Creatinine, Ser: 0.96 mg/dL (ref 0.50–1.35)
GFR calc Af Amer: >90 mL/min (ref >90)
GFR calc non Af Amer: >90 mL/min (ref >90)
Glucose, Bld: 104 mg/dL — ABNORMAL HIGH (ref 70–99)
Total Bilirubin: 0.3 mg/dL (ref 0.3–1.2)

## 2012-04-25 LAB — CBC
HCT: 34.7 % — ABNORMAL LOW (ref 39.0–52.0)
MCH: 30.9 pg (ref 26.0–34.0)
MCV: 89.4 fL (ref 78.0–100.0)
Platelets: 131 10*3/uL — ABNORMAL LOW (ref 150–400)
RBC: 3.88 MIL/uL — ABNORMAL LOW (ref 4.22–5.81)

## 2012-04-25 MED ORDER — LEVOFLOXACIN 750 MG PO TABS: 750.0000 mg | ORAL_TABLET | Freq: Every day | ORAL | Status: AC

## 2012-04-25 NOTE — Plan of Care (Signed)
Problem: Discharge Progression Outcomes Goal: Other Discharge Outcomes/Goals Outcome: Completed/Met Date Met:  04/25/12 Discharged to home with spouse
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
## 2012-04-29 DIAGNOSIS — R609 Edema, unspecified: Secondary | ICD-10-CM | POA: Diagnosis not present

## 2012-04-29 DIAGNOSIS — E669 Obesity, unspecified: Secondary | ICD-10-CM | POA: Diagnosis not present

## 2012-04-29 DIAGNOSIS — L02419 Cutaneous abscess of limb, unspecified: Secondary | ICD-10-CM | POA: Diagnosis not present

## 2012-04-29 DIAGNOSIS — R269 Unspecified abnormalities of gait and mobility: Secondary | ICD-10-CM | POA: Diagnosis not present

## 2012-04-29 DIAGNOSIS — IMO0002 Reserved for concepts with insufficient information to code with codable children: Secondary | ICD-10-CM | POA: Diagnosis not present

## 2012-04-29 DIAGNOSIS — L03119 Cellulitis of unspecified part of limb: Secondary | ICD-10-CM | POA: Diagnosis not present

## 2012-04-29 DIAGNOSIS — M7989 Other specified soft tissue disorders: Secondary | ICD-10-CM | POA: Diagnosis not present

## 2012-04-29 DIAGNOSIS — I872 Venous insufficiency (chronic) (peripheral): Secondary | ICD-10-CM | POA: Diagnosis present

## 2012-04-29 DIAGNOSIS — I890 Lymphedema, not elsewhere classified: Secondary | ICD-10-CM | POA: Diagnosis not present

## 2012-04-29 DIAGNOSIS — Z6841 Body Mass Index (BMI) 40.0 and over, adult: Secondary | ICD-10-CM | POA: Diagnosis not present

## 2012-04-29 DIAGNOSIS — R29898 Other symptoms and signs involving the musculoskeletal system: Secondary | ICD-10-CM | POA: Diagnosis not present

## 2012-04-29 DIAGNOSIS — I10 Essential (primary) hypertension: Secondary | ICD-10-CM | POA: Diagnosis not present

## 2012-04-29 LAB — CULTURE, BLOOD (ROUTINE X 2): Culture: NO GROWTH

## 2012-05-03 DIAGNOSIS — I890 Lymphedema, not elsewhere classified: Secondary | ICD-10-CM | POA: Insufficient documentation

## 2012-05-03 DIAGNOSIS — S14105A Unspecified injury at C5 level of cervical spinal cord, initial encounter: Secondary | ICD-10-CM | POA: Insufficient documentation

## 2012-05-05 ENCOUNTER — Ambulatory Visit (INDEPENDENT_AMBULATORY_CARE_PROVIDER_SITE_OTHER): Payer: Medicare Other | Admitting: Family Medicine

## 2012-05-05 ENCOUNTER — Encounter: Payer: Self-pay | Admitting: Family Medicine

## 2012-05-05 VITALS — BP 108/76 | HR 91 | Temp 98.6°F | Ht 71.0 in | Wt 335.0 lb

## 2012-05-05 DIAGNOSIS — I890 Lymphedema, not elsewhere classified: Secondary | ICD-10-CM | POA: Diagnosis not present

## 2012-05-05 DIAGNOSIS — L03116 Cellulitis of left lower limb: Secondary | ICD-10-CM

## 2012-05-05 DIAGNOSIS — L02419 Cutaneous abscess of limb, unspecified: Secondary | ICD-10-CM

## 2012-05-05 DIAGNOSIS — L03119 Cellulitis of unspecified part of limb: Secondary | ICD-10-CM

## 2012-05-05 MED ORDER — CLINDAMYCIN HCL 150 MG PO CAPS: 450.0000 mg | ORAL_CAPSULE | Freq: Three times a day (TID) | ORAL | Status: DC

## 2012-05-05 NOTE — Patient Instructions (Signed)
There is no evidence of cellulitis today Please continue with your clindamycin.  I have sent an additional prescription for clindamycin over to your pharmacy. Only pick this up if you develop cellulitis again. Call me when you pick this up so that I know you are on antibiotics Ask the therapists at the clinic to stop using the gloves and lotions that are causing your dermatitis.  If you leg becomes inflamed again after therapy use some hydrogen peroxide to clean the skin. If you develop cellulitis even without going to therapy we may need to put you on a prophylactic dose of antibiotics.
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
## 2012-05-06 NOTE — Assessment & Plan Note (Signed)
Improving w/ therapy Continue therapy Will need to monitor closely for cellulitis as therapy is the likely source

## 2012-05-06 NOTE — Assessment & Plan Note (Signed)
Resolving on clinda.  Concern for multiple bouts of cellulitis in the past 72mo. 4 episodes, w/ one requiring ICU admission Pt is at risk for cellulitis given severe lymphedema. Concerns that cellulitis is coming after therapy sessions involving message w/ certain gloves and lotions. No other insiting events.  Will Rx clinda for pt to have at home for future cellulitis. Pt w/ clear understanding not to use this unless obvious infection, and to call me to let know.  Will consider small prophylactic dose in the future if continues to have cellulitis if continues to have episodes w/o being associated w/ therapy.  As lyphadema improves pt will be less predisposed to cellulitis.

## 2012-05-11 DIAGNOSIS — IMO0001 Reserved for inherently not codable concepts without codable children: Secondary | ICD-10-CM | POA: Diagnosis not present

## 2012-05-11 DIAGNOSIS — I890 Lymphedema, not elsewhere classified: Secondary | ICD-10-CM | POA: Diagnosis not present

## 2012-05-13 DIAGNOSIS — I890 Lymphedema, not elsewhere classified: Secondary | ICD-10-CM | POA: Diagnosis not present

## 2012-05-13 DIAGNOSIS — IMO0001 Reserved for inherently not codable concepts without codable children: Secondary | ICD-10-CM | POA: Diagnosis not present

## 2012-05-19 DIAGNOSIS — I890 Lymphedema, not elsewhere classified: Secondary | ICD-10-CM | POA: Diagnosis not present

## 2012-05-19 DIAGNOSIS — IMO0001 Reserved for inherently not codable concepts without codable children: Secondary | ICD-10-CM | POA: Diagnosis not present

## 2012-05-20 DIAGNOSIS — I890 Lymphedema, not elsewhere classified: Secondary | ICD-10-CM | POA: Diagnosis not present

## 2012-05-20 DIAGNOSIS — IMO0001 Reserved for inherently not codable concepts without codable children: Secondary | ICD-10-CM | POA: Diagnosis not present

## 2012-05-24 DIAGNOSIS — IMO0001 Reserved for inherently not codable concepts without codable children: Secondary | ICD-10-CM | POA: Diagnosis not present

## 2012-05-24 DIAGNOSIS — I890 Lymphedema, not elsewhere classified: Secondary | ICD-10-CM | POA: Diagnosis not present

## 2012-05-26 DIAGNOSIS — L02419 Cutaneous abscess of limb, unspecified: Secondary | ICD-10-CM | POA: Diagnosis not present

## 2012-05-26 DIAGNOSIS — I890 Lymphedema, not elsewhere classified: Secondary | ICD-10-CM | POA: Diagnosis not present

## 2012-05-26 DIAGNOSIS — L03119 Cellulitis of unspecified part of limb: Secondary | ICD-10-CM | POA: Insufficient documentation

## 2012-05-26 DIAGNOSIS — E669 Obesity, unspecified: Secondary | ICD-10-CM | POA: Diagnosis not present

## 2012-05-26 DIAGNOSIS — Z5189 Encounter for other specified aftercare: Secondary | ICD-10-CM | POA: Diagnosis not present

## 2012-05-27 DIAGNOSIS — IMO0001 Reserved for inherently not codable concepts without codable children: Secondary | ICD-10-CM | POA: Diagnosis not present

## 2012-05-27 DIAGNOSIS — I890 Lymphedema, not elsewhere classified: Secondary | ICD-10-CM | POA: Diagnosis not present

## 2012-05-28 DIAGNOSIS — IMO0001 Reserved for inherently not codable concepts without codable children: Secondary | ICD-10-CM | POA: Diagnosis not present

## 2012-05-28 DIAGNOSIS — I890 Lymphedema, not elsewhere classified: Secondary | ICD-10-CM | POA: Diagnosis not present

## 2012-06-01 DIAGNOSIS — IMO0001 Reserved for inherently not codable concepts without codable children: Secondary | ICD-10-CM | POA: Diagnosis not present

## 2012-06-01 DIAGNOSIS — I890 Lymphedema, not elsewhere classified: Secondary | ICD-10-CM | POA: Diagnosis not present

## 2012-06-02 DIAGNOSIS — I890 Lymphedema, not elsewhere classified: Secondary | ICD-10-CM | POA: Diagnosis not present

## 2012-06-02 DIAGNOSIS — IMO0001 Reserved for inherently not codable concepts without codable children: Secondary | ICD-10-CM | POA: Diagnosis not present

## 2012-06-08 DIAGNOSIS — M6281 Muscle weakness (generalized): Secondary | ICD-10-CM | POA: Diagnosis not present

## 2012-06-08 DIAGNOSIS — I890 Lymphedema, not elsewhere classified: Secondary | ICD-10-CM | POA: Diagnosis not present

## 2012-06-08 DIAGNOSIS — R269 Unspecified abnormalities of gait and mobility: Secondary | ICD-10-CM | POA: Diagnosis not present

## 2012-06-08 DIAGNOSIS — R262 Difficulty in walking, not elsewhere classified: Secondary | ICD-10-CM | POA: Diagnosis not present

## 2012-06-08 DIAGNOSIS — Z5189 Encounter for other specified aftercare: Secondary | ICD-10-CM | POA: Diagnosis not present

## 2012-06-17 DIAGNOSIS — L02419 Cutaneous abscess of limb, unspecified: Secondary | ICD-10-CM | POA: Diagnosis not present

## 2012-06-17 DIAGNOSIS — L03119 Cellulitis of unspecified part of limb: Secondary | ICD-10-CM | POA: Diagnosis not present

## 2012-06-17 DIAGNOSIS — B353 Tinea pedis: Secondary | ICD-10-CM | POA: Diagnosis not present

## 2012-06-17 DIAGNOSIS — I890 Lymphedema, not elsewhere classified: Secondary | ICD-10-CM | POA: Diagnosis not present

## 2012-07-07 DIAGNOSIS — I890 Lymphedema, not elsewhere classified: Secondary | ICD-10-CM | POA: Diagnosis not present

## 2012-07-07 DIAGNOSIS — R269 Unspecified abnormalities of gait and mobility: Secondary | ICD-10-CM | POA: Diagnosis not present

## 2012-07-07 DIAGNOSIS — M6281 Muscle weakness (generalized): Secondary | ICD-10-CM | POA: Diagnosis not present

## 2012-07-07 DIAGNOSIS — Z5189 Encounter for other specified aftercare: Secondary | ICD-10-CM | POA: Diagnosis not present

## 2012-07-12 DIAGNOSIS — R269 Unspecified abnormalities of gait and mobility: Secondary | ICD-10-CM | POA: Diagnosis not present

## 2012-07-12 DIAGNOSIS — M6281 Muscle weakness (generalized): Secondary | ICD-10-CM | POA: Diagnosis not present

## 2012-07-12 DIAGNOSIS — I890 Lymphedema, not elsewhere classified: Secondary | ICD-10-CM | POA: Diagnosis not present

## 2012-07-12 DIAGNOSIS — Z5189 Encounter for other specified aftercare: Secondary | ICD-10-CM | POA: Diagnosis not present

## 2012-07-15 DIAGNOSIS — Z5189 Encounter for other specified aftercare: Secondary | ICD-10-CM | POA: Diagnosis not present

## 2012-07-15 DIAGNOSIS — I890 Lymphedema, not elsewhere classified: Secondary | ICD-10-CM | POA: Diagnosis not present

## 2012-07-15 DIAGNOSIS — M6281 Muscle weakness (generalized): Secondary | ICD-10-CM | POA: Diagnosis not present

## 2012-07-15 DIAGNOSIS — R269 Unspecified abnormalities of gait and mobility: Secondary | ICD-10-CM | POA: Diagnosis not present

## 2012-07-21 DIAGNOSIS — R269 Unspecified abnormalities of gait and mobility: Secondary | ICD-10-CM | POA: Diagnosis not present

## 2012-07-21 DIAGNOSIS — M6281 Muscle weakness (generalized): Secondary | ICD-10-CM | POA: Diagnosis not present

## 2012-07-21 DIAGNOSIS — I890 Lymphedema, not elsewhere classified: Secondary | ICD-10-CM | POA: Diagnosis not present

## 2012-07-21 DIAGNOSIS — Z5189 Encounter for other specified aftercare: Secondary | ICD-10-CM | POA: Diagnosis not present

## 2012-07-23 ENCOUNTER — Encounter: Payer: Self-pay | Admitting: Family Medicine

## 2012-07-23 ENCOUNTER — Ambulatory Visit (INDEPENDENT_AMBULATORY_CARE_PROVIDER_SITE_OTHER): Payer: Medicare Other | Admitting: Family Medicine

## 2012-07-23 VITALS — BP 138/80 | HR 82 | Temp 98.4°F | Wt 341.0 lb

## 2012-07-23 DIAGNOSIS — B353 Tinea pedis: Secondary | ICD-10-CM

## 2012-07-23 DIAGNOSIS — I890 Lymphedema, not elsewhere classified: Secondary | ICD-10-CM | POA: Diagnosis not present

## 2012-07-23 DIAGNOSIS — R269 Unspecified abnormalities of gait and mobility: Secondary | ICD-10-CM | POA: Diagnosis not present

## 2012-07-23 DIAGNOSIS — M25519 Pain in unspecified shoulder: Secondary | ICD-10-CM | POA: Diagnosis not present

## 2012-07-23 DIAGNOSIS — Z5189 Encounter for other specified aftercare: Secondary | ICD-10-CM | POA: Diagnosis not present

## 2012-07-23 DIAGNOSIS — I10 Essential (primary) hypertension: Secondary | ICD-10-CM | POA: Diagnosis not present

## 2012-07-23 DIAGNOSIS — M25511 Pain in right shoulder: Secondary | ICD-10-CM

## 2012-07-23 DIAGNOSIS — M79672 Pain in left foot: Secondary | ICD-10-CM

## 2012-07-23 DIAGNOSIS — M6281 Muscle weakness (generalized): Secondary | ICD-10-CM | POA: Diagnosis not present

## 2012-07-23 DIAGNOSIS — M79609 Pain in unspecified limb: Secondary | ICD-10-CM | POA: Diagnosis not present

## 2012-07-23 MED ORDER — TERBINAFINE HCL 250 MG PO TABS: 250.0000 mg | ORAL_TABLET | Freq: Every day | ORAL | Status: DC

## 2012-07-23 MED ORDER — IBUPROFEN 600 MG PO TABS: 600.0000 mg | ORAL_TABLET | Freq: Four times a day (QID) | ORAL | Status: DC | PRN

## 2012-07-23 NOTE — Patient Instructions (Addendum)
There is no sign of bacterial infection at this time Please start taking Lamisil Please cover your foot with the 2x2 pads when you go to church and wear your Sketchers. You may just need to buy a different shoe with a wide toe box Please let me know if your stockings do not come Please conitnue to strengthen your shoulder, take the ibuprofen as needed for the pain.
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
## 2012-07-23 NOTE — Assessment & Plan Note (Signed)
Tinea pedis

## 2012-07-24 DIAGNOSIS — M79672 Pain in left foot: Secondary | ICD-10-CM | POA: Insufficient documentation

## 2012-07-24 NOTE — Assessment & Plan Note (Signed)
Finished at lymphedema therapy Specialized compression stockings ordered by therapy team Cont w/ stockings

## 2012-07-24 NOTE — Assessment & Plan Note (Signed)
BP OK today No need for therapy

## 2012-07-24 NOTE — Assessment & Plan Note (Signed)
MEchanical irritation from 1 particular pair of shoes. Pt w/ severely edematous LE.  Pt to use bacitracin ointment PRN and to protect area w/ gauze when wearing certain shoe or simply purchase different shoe w/ larger shoe box.

## 2012-07-24 NOTE — Assessment & Plan Note (Signed)
Pain exclusively when lying on R side.  Likely AC joint strain Cont NSAIDs, massage, heat, and shoulder strengthening exercises.  Consider steroid inj in futre.  If able to strengthen shoulder mucles likely to limited attacks when pt does sleep on R side.

## 2012-07-28 DIAGNOSIS — Z5189 Encounter for other specified aftercare: Secondary | ICD-10-CM | POA: Diagnosis not present

## 2012-07-28 DIAGNOSIS — I890 Lymphedema, not elsewhere classified: Secondary | ICD-10-CM | POA: Diagnosis not present

## 2012-07-28 DIAGNOSIS — M6281 Muscle weakness (generalized): Secondary | ICD-10-CM | POA: Diagnosis not present

## 2012-07-28 DIAGNOSIS — R269 Unspecified abnormalities of gait and mobility: Secondary | ICD-10-CM | POA: Diagnosis not present

## 2012-08-04 DIAGNOSIS — R269 Unspecified abnormalities of gait and mobility: Secondary | ICD-10-CM | POA: Diagnosis not present

## 2012-08-04 DIAGNOSIS — M6281 Muscle weakness (generalized): Secondary | ICD-10-CM | POA: Diagnosis not present

## 2012-08-04 DIAGNOSIS — Z5189 Encounter for other specified aftercare: Secondary | ICD-10-CM | POA: Diagnosis not present

## 2012-08-04 DIAGNOSIS — I890 Lymphedema, not elsewhere classified: Secondary | ICD-10-CM | POA: Diagnosis not present

## 2012-08-10 DIAGNOSIS — R269 Unspecified abnormalities of gait and mobility: Secondary | ICD-10-CM | POA: Diagnosis not present

## 2012-08-10 DIAGNOSIS — IMO0001 Reserved for inherently not codable concepts without codable children: Secondary | ICD-10-CM | POA: Diagnosis not present

## 2012-08-10 DIAGNOSIS — R262 Difficulty in walking, not elsewhere classified: Secondary | ICD-10-CM | POA: Diagnosis not present

## 2012-08-10 DIAGNOSIS — M6281 Muscle weakness (generalized): Secondary | ICD-10-CM | POA: Diagnosis not present

## 2012-10-10 ENCOUNTER — Encounter (HOSPITAL_COMMUNITY): Payer: Self-pay | Admitting: Emergency Medicine

## 2012-10-10 ENCOUNTER — Emergency Department (HOSPITAL_COMMUNITY)
Admission: EM | Admit: 2012-10-10 | Discharge: 2012-10-11 | Disposition: A | Discharge status: Home / Self Care | Payer: Medicare Other | Attending: Emergency Medicine | Admitting: Emergency Medicine

## 2012-10-10 DIAGNOSIS — M609 Myositis, unspecified: Secondary | ICD-10-CM

## 2012-10-10 DIAGNOSIS — IMO0001 Reserved for inherently not codable concepts without codable children: Secondary | ICD-10-CM | POA: Diagnosis not present

## 2012-10-10 DIAGNOSIS — Z79899 Other long term (current) drug therapy: Secondary | ICD-10-CM | POA: Diagnosis not present

## 2012-10-10 DIAGNOSIS — M7989 Other specified soft tissue disorders: Secondary | ICD-10-CM | POA: Diagnosis not present

## 2012-10-10 DIAGNOSIS — Z792 Long term (current) use of antibiotics: Secondary | ICD-10-CM | POA: Diagnosis not present

## 2012-10-10 DIAGNOSIS — I10 Essential (primary) hypertension: Secondary | ICD-10-CM | POA: Insufficient documentation

## 2012-10-10 DIAGNOSIS — Z87891 Personal history of nicotine dependence: Secondary | ICD-10-CM | POA: Diagnosis not present

## 2012-10-10 DIAGNOSIS — Z87828 Personal history of other (healed) physical injury and trauma: Secondary | ICD-10-CM | POA: Diagnosis not present

## 2012-10-10 DIAGNOSIS — M79609 Pain in unspecified limb: Secondary | ICD-10-CM | POA: Diagnosis not present
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
## 2012-10-10 NOTE — ED Notes (Addendum)
Patient complaining of left arm pain. States "I have been working out and my arms were sore but last night my left arm starting hurting really bad and I noticed it was swollen. I haven't had any problems in that arm since I had a PICC line in April 2014."

## 2012-10-11 NOTE — ED Notes (Signed)
Pt alert, exited ED by wheelchair escorted by RN. Discharge instructions reviewed.

## 2012-10-24 ENCOUNTER — Encounter (HOSPITAL_COMMUNITY): Payer: Self-pay | Admitting: Emergency Medicine

## 2012-10-24 ENCOUNTER — Emergency Department (HOSPITAL_COMMUNITY)
Admission: EM | Admit: 2012-10-24 | Discharge: 2012-10-24 | Disposition: A | Discharge status: Home / Self Care | Payer: Medicare Other | Attending: Emergency Medicine | Admitting: Emergency Medicine

## 2012-10-24 DIAGNOSIS — I10 Essential (primary) hypertension: Secondary | ICD-10-CM | POA: Diagnosis not present

## 2012-10-24 DIAGNOSIS — Z792 Long term (current) use of antibiotics: Secondary | ICD-10-CM | POA: Diagnosis not present

## 2012-10-24 DIAGNOSIS — M549 Dorsalgia, unspecified: Secondary | ICD-10-CM | POA: Insufficient documentation

## 2012-10-24 DIAGNOSIS — Z87828 Personal history of other (healed) physical injury and trauma: Secondary | ICD-10-CM | POA: Diagnosis not present

## 2012-10-24 DIAGNOSIS — Z87891 Personal history of nicotine dependence: Secondary | ICD-10-CM | POA: Insufficient documentation

## 2012-10-24 DIAGNOSIS — L0390 Cellulitis, unspecified: Secondary | ICD-10-CM

## 2012-10-24 DIAGNOSIS — Z79899 Other long term (current) drug therapy: Secondary | ICD-10-CM | POA: Insufficient documentation

## 2012-10-24 DIAGNOSIS — R51 Headache: Secondary | ICD-10-CM | POA: Insufficient documentation

## 2012-10-24 DIAGNOSIS — L02419 Cutaneous abscess of limb, unspecified: Secondary | ICD-10-CM | POA: Diagnosis not present

## 2012-10-24 LAB — CBC WITH DIFFERENTIAL/PLATELET
Hemoglobin: 14.9 g/dL (ref 13.0–17.0)
Lymphs Abs: 3.3 10*3/uL (ref 0.7–4.0)
Monocytes Relative: 6 % (ref 3–12)
Neutro Abs: 6.7 10*3/uL (ref 1.7–7.7)
Neutrophils Relative %: 62 % (ref 43–77)
Platelets: 173 10*3/uL (ref 150–400)
RBC: 4.86 MIL/uL (ref 4.22–5.81)
WBC: 10.9 10*3/uL — ABNORMAL HIGH (ref 4.0–10.5)

## 2012-10-24 LAB — CG4 I-STAT (LACTIC ACID): Lactic Acid, Venous: 1.12 mmol/L (ref 0.5–2.2)

## 2012-10-24 MED ORDER — IBUPROFEN 800 MG PO TABS
800.0000 mg | ORAL_TABLET | Freq: Once | ORAL | Status: AC
  Administered 2012-10-24: 800 mg via ORAL
  Filled 2012-10-24: qty 1
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
## 2012-10-24 NOTE — ED Notes (Signed)
Pt c/o back pain, headache, and pus coming out of 1 of his toes on his left foot. Pt says he has had this before and was given clindamycin by his doctor to take if he begins to feel like he is getting cellulitis again.

## 2012-11-05 ENCOUNTER — Encounter: Payer: Self-pay | Admitting: Family Medicine

## 2012-11-05 ENCOUNTER — Ambulatory Visit (INDEPENDENT_AMBULATORY_CARE_PROVIDER_SITE_OTHER): Payer: Medicare Other | Admitting: Family Medicine

## 2012-11-05 VITALS — BP 140/88 | Temp 99.3°F | Ht 71.0 in | Wt 343.0 lb

## 2012-11-05 DIAGNOSIS — L02419 Cutaneous abscess of limb, unspecified: Secondary | ICD-10-CM | POA: Diagnosis not present

## 2012-11-05 DIAGNOSIS — E669 Obesity, unspecified: Secondary | ICD-10-CM

## 2012-11-05 DIAGNOSIS — Z23 Encounter for immunization: Secondary | ICD-10-CM | POA: Diagnosis not present

## 2012-11-05 DIAGNOSIS — L03119 Cellulitis of unspecified part of limb: Secondary | ICD-10-CM

## 2012-11-05 DIAGNOSIS — I10 Essential (primary) hypertension: Secondary | ICD-10-CM

## 2012-11-05 DIAGNOSIS — I890 Lymphedema, not elsewhere classified: Secondary | ICD-10-CM | POA: Diagnosis not present

## 2012-11-05 MED ORDER — FUROSEMIDE 20 MG PO TABS: 20.0000 mg | ORAL_TABLET | Freq: Every day | ORAL | Status: DC | PRN

## 2012-11-05 MED ORDER — CLINDAMYCIN HCL 150 MG PO CAPS: 450.0000 mg | ORAL_CAPSULE | Freq: Three times a day (TID) | ORAL | Status: DC

## 2012-11-05 MED ORDER — MUPIROCIN 2 % EX OINT: TOPICAL_OINTMENT | Freq: Two times a day (BID) | CUTANEOUS | Status: DC

## 2012-11-05 NOTE — Assessment & Plan Note (Signed)
Using pre-prescribed clinda worked well for pt Will add mupirocin oint to use prophylactically

## 2012-11-05 NOTE — Assessment & Plan Note (Signed)
Wt up today Stopped exercising and poorer diet Pt to get back on the "wagon" per him encouraged

## 2012-11-05 NOTE — Patient Instructions (Signed)
You are doing well overall Please finish your current course of Clinda Please fill the new prescription and keep it at the house for future use. Remember to take it as prescribed Please use the Mupirocin ointment as needed to prevent infections Good luck getting back to exercising and healthy eating.

## 2012-11-05 NOTE — Assessment & Plan Note (Signed)
Significant improvement since initial eval. Continue compression stockings and wt loss. OPRN Lasix
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
## 2013-03-23 ENCOUNTER — Encounter: Payer: Self-pay | Admitting: Family Medicine

## 2013-03-23 ENCOUNTER — Ambulatory Visit (INDEPENDENT_AMBULATORY_CARE_PROVIDER_SITE_OTHER): Payer: Medicare Other | Admitting: Family Medicine

## 2013-03-23 VITALS — BP 134/74 | HR 84 | Temp 98.5°F | Ht 71.0 in | Wt 362.6 lb

## 2013-03-23 DIAGNOSIS — I890 Lymphedema, not elsewhere classified: Secondary | ICD-10-CM

## 2013-03-23 DIAGNOSIS — E669 Obesity, unspecified: Secondary | ICD-10-CM | POA: Diagnosis not present

## 2013-03-23 DIAGNOSIS — L02419 Cutaneous abscess of limb, unspecified: Secondary | ICD-10-CM

## 2013-03-23 DIAGNOSIS — M545 Low back pain, unspecified: Secondary | ICD-10-CM | POA: Diagnosis not present

## 2013-03-23 DIAGNOSIS — R600 Localized edema: Secondary | ICD-10-CM

## 2013-03-23 DIAGNOSIS — L03119 Cellulitis of unspecified part of limb: Secondary | ICD-10-CM

## 2013-03-23 DIAGNOSIS — M25519 Pain in unspecified shoulder: Secondary | ICD-10-CM | POA: Diagnosis not present

## 2013-03-23 DIAGNOSIS — R609 Edema, unspecified: Secondary | ICD-10-CM

## 2013-03-23 MED ORDER — CLINDAMYCIN HCL 150 MG PO CAPS: 300.0000 mg | ORAL_CAPSULE | Freq: Three times a day (TID) | ORAL | Status: DC

## 2013-03-23 MED ORDER — DICLOFENAC SODIUM 1 % TD GEL: 2.0000 g | Freq: Four times a day (QID) | TRANSDERMAL | Status: DC

## 2013-03-23 NOTE — Assessment & Plan Note (Addendum)
>>  ASSESSMENT AND PLAN FOR LYMPHEDEMA WRITTEN ON 03/23/2013  3:28 PM BY Jaime Dome J, MD  improved  >>ASSESSMENT AND PLAN FOR BILATERAL LEG EDEMA WRITTEN ON 03/23/2013  3:27 PM BY Esau Fridman J, MD  Improved overall after lymphedema therapy and continued silver impregnated compression stockings

## 2013-03-23 NOTE — Assessment & Plan Note (Signed)
Improved overall after lymphedema therapy and continued silver impregnated compression stockings
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
## 2013-03-23 NOTE — Assessment & Plan Note (Signed)
Fewer infections overall. Much improvement w/ excellent hygiene, silver impregnated compression stockings, lymphedema improvement.  Continue PRN Clinda at reduced dose. Concern for longterm resistance. Fortunately pts attacks are becoming less frequent. Last infection nearly 4.5 months ago

## 2013-03-23 NOTE — Assessment & Plan Note (Signed)
Wt up 20lbs in past several months. Likely from poor nutrition and no exercise.  Pt very motivated to get back to exercising Wife to assist.  I believe pt can make necessary changes to lose wt again

## 2013-03-23 NOTE — Assessment & Plan Note (Addendum)
OA vs deconditioning  Pt w/ significant spinal injury history and spends a fair amount of time in a wheelchair.  Would benefit greatly from PT voltaren gel

## 2013-03-23 NOTE — Patient Instructions (Signed)
You are doing well overall Pelase call me with the name of the compression stockings you use Please start exercising and eating better Use the voltaren gel for your lower back as needed The PT office in [place] should be calling you with an appointment time Pelase use the clinda for future infections Keep up the great work

## 2013-03-23 NOTE — Assessment & Plan Note (Signed)
Improved w/ wt lifting and worse if stops for prolonged time likely from deconditioning

## 2013-03-25 DIAGNOSIS — M545 Low back pain, unspecified: Secondary | ICD-10-CM | POA: Diagnosis not present

## 2013-03-25 DIAGNOSIS — IMO0001 Reserved for inherently not codable concepts without codable children: Secondary | ICD-10-CM | POA: Diagnosis not present

## 2013-03-29 DIAGNOSIS — M545 Low back pain, unspecified: Secondary | ICD-10-CM | POA: Diagnosis not present

## 2013-03-29 DIAGNOSIS — IMO0001 Reserved for inherently not codable concepts without codable children: Secondary | ICD-10-CM | POA: Diagnosis not present

## 2013-03-30 DIAGNOSIS — M545 Low back pain, unspecified: Secondary | ICD-10-CM | POA: Diagnosis not present

## 2013-03-30 DIAGNOSIS — IMO0001 Reserved for inherently not codable concepts without codable children: Secondary | ICD-10-CM | POA: Diagnosis not present

## 2013-05-01 ENCOUNTER — Emergency Department (HOSPITAL_COMMUNITY)
Admission: EM | Admit: 2013-05-01 | Discharge: 2013-05-01 | Disposition: A | Discharge status: Home / Self Care | Payer: Medicare Other | Attending: Emergency Medicine | Admitting: Emergency Medicine

## 2013-05-01 ENCOUNTER — Encounter (HOSPITAL_COMMUNITY): Payer: Self-pay | Admitting: Emergency Medicine

## 2013-05-01 DIAGNOSIS — Z87891 Personal history of nicotine dependence: Secondary | ICD-10-CM | POA: Diagnosis not present

## 2013-05-01 DIAGNOSIS — R062 Wheezing: Secondary | ICD-10-CM | POA: Insufficient documentation

## 2013-05-01 DIAGNOSIS — Z791 Long term (current) use of non-steroidal anti-inflammatories (NSAID): Secondary | ICD-10-CM | POA: Diagnosis not present

## 2013-05-01 DIAGNOSIS — J069 Acute upper respiratory infection, unspecified: Secondary | ICD-10-CM | POA: Diagnosis not present

## 2013-05-01 DIAGNOSIS — Z792 Long term (current) use of antibiotics: Secondary | ICD-10-CM | POA: Diagnosis not present

## 2013-05-01 DIAGNOSIS — I10 Essential (primary) hypertension: Secondary | ICD-10-CM | POA: Diagnosis not present

## 2013-05-01 MED ORDER — DOXYCYCLINE HYCLATE 100 MG PO CAPS: 100.0000 mg | ORAL_CAPSULE | Freq: Two times a day (BID) | ORAL | Status: AC

## 2013-05-01 MED ORDER — PREDNISONE 10 MG PO TABS
60.0000 mg | ORAL_TABLET | Freq: Once | ORAL | Status: AC
Start: 2013-05-01 — End: 2013-05-01
  Administered 2013-05-01: 60 mg via ORAL
  Filled 2013-05-01 (×2): qty 1

## 2013-05-01 MED ORDER — HYDROCOD POLST-CHLORPHEN POLST 10-8 MG/5ML PO LQCR: 5.0000 mL | Freq: Two times a day (BID) | ORAL | Status: DC | PRN

## 2013-05-01 MED ORDER — PREDNISONE 10 MG PO TABS: ORAL_TABLET | ORAL | Status: DC

## 2013-05-01 MED ORDER — ALBUTEROL SULFATE HFA 108 (90 BASE) MCG/ACT IN AERS
2.0000 | INHALATION_SPRAY | Freq: Once | RESPIRATORY_TRACT | Status: AC
  Administered 2013-05-01: 2 via RESPIRATORY_TRACT
  Filled 2013-05-01: qty 6.7

## 2013-05-01 MED ORDER — HYDROCOD POLST-CHLORPHEN POLST 10-8 MG/5ML PO LQCR
5.0000 mL | Freq: Once | ORAL | Status: AC
Start: 2013-05-01 — End: 2013-05-01
  Administered 2013-05-01: 5 mL via ORAL
  Filled 2013-05-01: qty 5

## 2013-05-01 MED ORDER — DOXYCYCLINE HYCLATE 100 MG PO TABS
100.0000 mg | ORAL_TABLET | Freq: Once | ORAL | Status: AC
  Administered 2013-05-01: 100 mg via ORAL
  Filled 2013-05-01: qty 1
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
## 2013-05-01 NOTE — Discharge Instructions (Signed)
Please increase fluids. Please wash hands frequently. Use albuterol 2 puffs every 4 hours. Use prednisone and doxycycline daily. Use Tussionex for cough, this may cause drowsiness. Use with caution. Cough, Adult  A cough is a reflex. It helps you clear your throat and airways. A cough can help heal your body. A cough can last 2 or 3 weeks (acute) or may last more than 8 weeks (chronic). Some common causes of a cough can include an infection, allergy, or a cold. HOME CARE  Only take medicine as told by your doctor.  If given, take your medicines (antibiotics) as told. Finish them even if you start to feel better.  Use a cold steam vaporizer or humidier in your home. This can help loosen thick spit (secretions).  Sleep so you are almost sitting up (semi-upright). Use pillows to do this. This helps reduce coughing.  Rest as needed.  Stop smoking if you smoke. GET HELP RIGHT AWAY IF:  You have yellowish-white fluid (pus) in your thick spit.  Your cough gets worse.  Your medicine does not reduce coughing, and you are losing sleep.  You cough up blood.  You have trouble breathing.  Your pain gets worse and medicine does not help.  You have a fever. MAKE SURE YOU:   Understand these instructions.  Will watch your condition.  Will get help right away if you are not doing well or get worse. Document Released: 09/05/2010 Document Revised: 03/17/2011 Document Reviewed: 09/05/2010 [facility] Patient Information 2014 [place], [place].  Upper Respiratory Infection, Adult An upper respiratory infection (URI) is also known as the common cold. It is often caused by a type of germ (virus). Colds are easily spread (contagious). You can pass it to others by kissing, coughing, sneezing, or drinking out of the same glass. Usually, you get better in 1 or 2 weeks.  HOME CARE   Only take medicine as told by your doctor.  Use a warm mist humidifier or breathe in steam from a hot shower.  Drink  enough water and fluids to keep your pee (urine) clear or pale yellow.  Get plenty of rest.  Return to work when your temperature is back to normal or as told by your doctor. You may use a face mask and wash your hands to stop your cold from spreading. GET HELP RIGHT AWAY IF:   After the first few days, you feel you are getting worse.  You have questions about your medicine.  You have chills, shortness of breath, or brown or red spit (mucus).  You have yellow or brown snot (nasal discharge) or pain in the face, especially when you bend forward.  You have a fever, puffy (swollen) neck, pain when you swallow, or white spots in the back of your throat.  You have a bad headache, ear pain, sinus pain, or chest pain.  You have a high-pitched whistling sound when you breathe in and out (wheezing).  You have a lasting cough or cough up blood.  You have sore muscles or a stiff neck. MAKE SURE YOU:   Understand these instructions.  Will watch your condition.  Will get help right away if you are not doing well or get worse. Document Released: 06/11/2007 Document Revised: 03/17/2011 Document Reviewed: 04/29/2010 [facility] Patient Information 2014 [place], [place].

## 2013-05-01 NOTE — ED Notes (Signed)
Pt reports generalized aches,cough,sinus congestion x3 days. Pt denies any known fevers. Pt reports loss of appetite. Pt reports taking OTC medication with no relief.

## 2013-05-01 NOTE — ED Provider Notes (Signed)
Medical screening examination/treatment/procedure(s) were performed by non-physician practitioner and as supervising physician I was immediately available for consultation/collaboration.   EKG Interpretation None        [name], MD 05/01/13 [phone]

## 2013-05-12 DIAGNOSIS — R079 Chest pain, unspecified: Secondary | ICD-10-CM | POA: Diagnosis not present

## 2013-05-12 DIAGNOSIS — Z79899 Other long term (current) drug therapy: Secondary | ICD-10-CM | POA: Diagnosis not present

## 2013-05-12 DIAGNOSIS — E669 Obesity, unspecified: Secondary | ICD-10-CM | POA: Diagnosis not present

## 2013-05-12 DIAGNOSIS — M79609 Pain in unspecified limb: Secondary | ICD-10-CM | POA: Diagnosis not present

## 2013-05-12 DIAGNOSIS — R0789 Other chest pain: Secondary | ICD-10-CM | POA: Diagnosis not present

## 2013-05-12 DIAGNOSIS — R0602 Shortness of breath: Secondary | ICD-10-CM | POA: Diagnosis not present

## 2013-05-12 DIAGNOSIS — Z87891 Personal history of nicotine dependence: Secondary | ICD-10-CM | POA: Diagnosis not present

## 2013-05-12 DIAGNOSIS — Z6841 Body Mass Index (BMI) 40.0 and over, adult: Secondary | ICD-10-CM | POA: Diagnosis not present

## 2013-05-12 DIAGNOSIS — R072 Precordial pain: Secondary | ICD-10-CM | POA: Diagnosis not present

## 2013-05-12 DIAGNOSIS — I890 Lymphedema, not elsewhere classified: Secondary | ICD-10-CM | POA: Diagnosis not present

## 2013-05-13 DIAGNOSIS — R079 Chest pain, unspecified: Secondary | ICD-10-CM | POA: Diagnosis not present

## 2013-05-13 DIAGNOSIS — R0602 Shortness of breath: Secondary | ICD-10-CM | POA: Diagnosis not present

## 2013-05-23 ENCOUNTER — Inpatient Hospital Stay: Payer: Medicare Other | Admitting: Family Medicine

## 2013-06-02 ENCOUNTER — Inpatient Hospital Stay: Payer: Medicare Other | Admitting: Family Medicine

## 2013-06-06 ENCOUNTER — Encounter: Payer: Self-pay | Admitting: Family Medicine

## 2013-06-06 ENCOUNTER — Ambulatory Visit (INDEPENDENT_AMBULATORY_CARE_PROVIDER_SITE_OTHER): Payer: Medicare Other | Admitting: Family Medicine

## 2013-06-06 VITALS — BP 142/82 | HR 84 | Temp 98.1°F | Wt 329.0 lb

## 2013-06-06 DIAGNOSIS — R609 Edema, unspecified: Secondary | ICD-10-CM

## 2013-06-06 DIAGNOSIS — L02419 Cutaneous abscess of limb, unspecified: Secondary | ICD-10-CM | POA: Diagnosis not present

## 2013-06-06 DIAGNOSIS — E669 Obesity, unspecified: Secondary | ICD-10-CM

## 2013-06-06 DIAGNOSIS — L03119 Cellulitis of unspecified part of limb: Secondary | ICD-10-CM

## 2013-06-06 DIAGNOSIS — R059 Cough, unspecified: Secondary | ICD-10-CM | POA: Diagnosis not present

## 2013-06-06 DIAGNOSIS — R600 Localized edema: Secondary | ICD-10-CM

## 2013-06-06 DIAGNOSIS — R05 Cough: Secondary | ICD-10-CM | POA: Insufficient documentation

## 2013-06-06 LAB — COMPREHENSIVE METABOLIC PANEL
ALBUMIN: 4.3 g/dL (ref 3.5–5.2)
ALT: 16 U/L (ref 0–53)
AST: 20 U/L (ref 0–37)
Alkaline Phosphatase: 45 U/L (ref 39–117)
BUN: 12 mg/dL (ref 6–23)
CHLORIDE: 102 meq/L (ref 96–112)
CO2: 27 mEq/L (ref 19–32)
Calcium: 9.5 mg/dL (ref 8.4–10.5)
Creat: 0.99 mg/dL (ref 0.50–1.35)
GLUCOSE: 83 mg/dL (ref 70–99)
POTASSIUM: 4.3 meq/L (ref 3.5–5.3)
Sodium: 136 mEq/L (ref 135–145)
Total Bilirubin: 0.6 mg/dL (ref 0.2–1.2)
Total Protein: 7.4 g/dL (ref 6.0–8.3)

## 2013-06-06 MED ORDER — IBUPROFEN 600 MG PO TABS: 600.0000 mg | ORAL_TABLET | Freq: Four times a day (QID) | ORAL | Status: DC | PRN

## 2013-06-06 MED ORDER — BENZONATATE 100 MG PO CAPS: 100.0000 mg | ORAL_CAPSULE | Freq: Three times a day (TID) | ORAL | Status: DC | PRN

## 2013-06-06 MED ORDER — FLUTICASONE PROPIONATE 50 MCG/ACT NA SUSP: 1.0000 | Freq: Every day | NASAL | Status: DC

## 2013-06-06 NOTE — Patient Instructions (Signed)
You are doing well overall Please start the flonase every night for the post nasal drip Please consider using the tesselon perls for the cough Please come back in 3 months or sooner if needed

## 2013-06-06 NOTE — Assessment & Plan Note (Signed)
>>  ASSESSMENT AND PLAN FOR BILATERAL LEG EDEMA WRITTEN ON 06/06/2013 12:40 PM BY MERRELL, Elmon Else, MD  cmet today

## 2013-06-06 NOTE — Assessment & Plan Note (Signed)
No recent occurrence.  Continue w/ current regimen as needed.  Pt very educated on this matter

## 2013-06-06 NOTE — Addendum Note (Signed)
Addended by: Ozella Rocks on: 06/06/2013 12:41 PM   Modules accepted: Orders

## 2013-06-06 NOTE — Assessment & Plan Note (Signed)
Likely secondary to post nasal drip that is allergic in nature May be residual from URI.  Start flonase Start Tessalon perls PRN No signs of ongoing infection

## 2013-06-06 NOTE — Assessment & Plan Note (Signed)
Wt down significantly.  Doing very well w/ diet and what exercise he's able to perform
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
## 2013-06-06 NOTE — Assessment & Plan Note (Signed)
cmet today 

## 2013-06-10 ENCOUNTER — Encounter (HOSPITAL_COMMUNITY): Payer: Self-pay | Admitting: Emergency Medicine

## 2013-06-10 ENCOUNTER — Inpatient Hospital Stay (HOSPITAL_COMMUNITY)
Admission: EM | Admit: 2013-06-10 | Discharge: 2013-06-15 | DRG: 603 | Disposition: A | Discharge status: Home / Self Care | Payer: Medicare Other | Attending: Internal Medicine | Admitting: Internal Medicine

## 2013-06-10 ENCOUNTER — Emergency Department (HOSPITAL_COMMUNITY): Payer: Medicare Other

## 2013-06-10 DIAGNOSIS — Z833 Family history of diabetes mellitus: Secondary | ICD-10-CM | POA: Diagnosis not present

## 2013-06-10 DIAGNOSIS — R05 Cough: Secondary | ICD-10-CM | POA: Diagnosis not present

## 2013-06-10 DIAGNOSIS — G8220 Paraplegia, unspecified: Secondary | ICD-10-CM | POA: Diagnosis present

## 2013-06-10 DIAGNOSIS — I890 Lymphedema, not elsewhere classified: Secondary | ICD-10-CM

## 2013-06-10 DIAGNOSIS — L0390 Cellulitis, unspecified: Secondary | ICD-10-CM

## 2013-06-10 DIAGNOSIS — R059 Cough, unspecified: Secondary | ICD-10-CM | POA: Diagnosis not present

## 2013-06-10 DIAGNOSIS — Z6841 Body Mass Index (BMI) 40.0 and over, adult: Secondary | ICD-10-CM | POA: Diagnosis not present

## 2013-06-10 DIAGNOSIS — I10 Essential (primary) hypertension: Secondary | ICD-10-CM | POA: Diagnosis not present

## 2013-06-10 DIAGNOSIS — L02419 Cutaneous abscess of limb, unspecified: Secondary | ICD-10-CM | POA: Diagnosis not present

## 2013-06-10 DIAGNOSIS — Z87891 Personal history of nicotine dependence: Secondary | ICD-10-CM

## 2013-06-10 DIAGNOSIS — Z981 Arthrodesis status: Secondary | ICD-10-CM

## 2013-06-10 DIAGNOSIS — L03119 Cellulitis of unspecified part of limb: Secondary | ICD-10-CM

## 2013-06-10 DIAGNOSIS — L0291 Cutaneous abscess, unspecified: Secondary | ICD-10-CM | POA: Diagnosis not present

## 2013-06-10 DIAGNOSIS — IMO0002 Reserved for concepts with insufficient information to code with codable children: Secondary | ICD-10-CM | POA: Diagnosis not present

## 2013-06-10 DIAGNOSIS — R609 Edema, unspecified: Secondary | ICD-10-CM | POA: Diagnosis not present

## 2013-06-10 DIAGNOSIS — E669 Obesity, unspecified: Secondary | ICD-10-CM

## 2013-06-10 DIAGNOSIS — G9589 Other specified diseases of spinal cord: Secondary | ICD-10-CM | POA: Diagnosis not present

## 2013-06-10 DIAGNOSIS — R600 Localized edema: Secondary | ICD-10-CM

## 2013-06-10 DIAGNOSIS — R112 Nausea with vomiting, unspecified: Secondary | ICD-10-CM | POA: Diagnosis not present

## 2013-06-10 DIAGNOSIS — R509 Fever, unspecified: Secondary | ICD-10-CM | POA: Diagnosis not present

## 2013-06-10 HISTORY — DX: Morbid (severe) obesity due to excess calories: E66.01

## 2013-06-10 LAB — CBC WITH DIFFERENTIAL/PLATELET
BASOS PCT: 0 % (ref 0–1)
Basophils Absolute: 0 10*3/uL (ref 0.0–0.1)
Eosinophils Absolute: 0 10*3/uL (ref 0.0–0.7)
Eosinophils Relative: 0 % (ref 0–5)
HCT: 40.4 % (ref 39.0–52.0)
HEMOGLOBIN: 13.7 g/dL (ref 13.0–17.0)
Lymphocytes Relative: 4 % — ABNORMAL LOW (ref 12–46)
Lymphs Abs: 0.7 10*3/uL (ref 0.7–4.0)
MCH: 30.8 pg (ref 26.0–34.0)
MCHC: 33.9 g/dL (ref 30.0–36.0)
MCV: 90.8 fL (ref 78.0–100.0)
MONOS PCT: 3 % (ref 3–12)
Monocytes Absolute: 0.6 10*3/uL (ref 0.1–1.0)
NEUTROS ABS: 20 10*3/uL — AB (ref 1.7–7.7)
Neutrophils Relative %: 93 % — ABNORMAL HIGH (ref 43–77)
PLATELETS: 145 10*3/uL — AB (ref 150–400)
RBC: 4.45 MIL/uL (ref 4.22–5.81)
RDW: 12.7 % (ref 11.5–15.5)
WBC: 21.4 10*3/uL — ABNORMAL HIGH (ref 4.0–10.5)

## 2013-06-10 LAB — URINALYSIS, ROUTINE W REFLEX MICROSCOPIC
Bilirubin Urine: NEGATIVE
GLUCOSE, UA: NEGATIVE mg/dL
Hgb urine dipstick: NEGATIVE
LEUKOCYTES UA: NEGATIVE
NITRITE: NEGATIVE
PROTEIN: NEGATIVE mg/dL
Specific Gravity, Urine: 1.025 (ref 1.005–1.030)
UROBILINOGEN UA: 0.2 mg/dL (ref 0.0–1.0)
pH: 6 (ref 5.0–8.0)

## 2013-06-10 LAB — COMPREHENSIVE METABOLIC PANEL
ALT: 16 U/L (ref 0–53)
AST: 23 U/L (ref 0–37)
Albumin: 3.7 g/dL (ref 3.5–5.2)
Alkaline Phosphatase: 44 U/L (ref 39–117)
BUN: 14 mg/dL (ref 6–23)
CALCIUM: 9.3 mg/dL (ref 8.4–10.5)
CO2: 25 meq/L (ref 19–32)
CREATININE: 1.23 mg/dL (ref 0.50–1.35)
Chloride: 98 mEq/L (ref 96–112)
GFR calc Af Amer: 82 mL/min — ABNORMAL LOW (ref >90)
GFR calc non Af Amer: 70 mL/min — ABNORMAL LOW (ref >90)
Glucose, Bld: 111 mg/dL — ABNORMAL HIGH (ref 70–99)
Potassium: 3.9 mEq/L (ref 3.7–5.3)
Sodium: 137 mEq/L (ref 137–147)
TOTAL PROTEIN: 7.4 g/dL (ref 6.0–8.3)
Total Bilirubin: 1.3 mg/dL — ABNORMAL HIGH (ref 0.3–1.2)

## 2013-06-10 LAB — LACTIC ACID, PLASMA: LACTIC ACID, VENOUS: 2.7 mmol/L — AB (ref 0.5–2.2)

## 2013-06-10 MED ORDER — BENZONATATE 100 MG PO CAPS: 100.0000 mg | ORAL_CAPSULE | Freq: Three times a day (TID) | ORAL | Status: DC | PRN

## 2013-06-10 MED ORDER — VANCOMYCIN HCL 10 G IV SOLR
1500.0000 mg | Freq: Once | INTRAVENOUS | Status: AC
  Administered 2013-06-10: 1500 mg via INTRAVENOUS
  Filled 2013-06-10: qty 1500

## 2013-06-10 MED ORDER — ALBUTEROL SULFATE (2.5 MG/3ML) 0.083% IN NEBU
2.5000 mg | INHALATION_SOLUTION | Freq: Four times a day (QID) | RESPIRATORY_TRACT | Status: DC | PRN
  Administered 2013-06-11 – 2013-06-12 (×2): 2.5 mg via RESPIRATORY_TRACT
  Filled 2013-06-10 (×2): qty 3

## 2013-06-10 MED ORDER — IBUPROFEN 600 MG PO TABS
600.0000 mg | ORAL_TABLET | Freq: Four times a day (QID) | ORAL | Status: DC | PRN
  Administered 2013-06-10 – 2013-06-13 (×6): 600 mg via ORAL
  Filled 2013-06-10 (×6): qty 1

## 2013-06-10 MED ORDER — ZOLPIDEM TARTRATE 5 MG PO TABS
5.0000 mg | ORAL_TABLET | Freq: Every evening | ORAL | Status: DC | PRN
  Administered 2013-06-10 – 2013-06-13 (×3): 5 mg via ORAL
  Filled 2013-06-10 (×3): qty 1

## 2013-06-10 MED ORDER — ONDANSETRON HCL 4 MG PO TABS: 4.0000 mg | ORAL_TABLET | Freq: Four times a day (QID) | ORAL | Status: DC | PRN

## 2013-06-10 MED ORDER — SODIUM CHLORIDE 0.9 % IV SOLN
Freq: Once | INTRAVENOUS | Status: AC
  Administered 2013-06-10: 18:00:00 via INTRAVENOUS

## 2013-06-10 MED ORDER — FLUTICASONE PROPIONATE 50 MCG/ACT NA SUSP
1.0000 | Freq: Every day | NASAL | Status: DC
  Administered 2013-06-13 – 2013-06-14 (×2): 2 via NASAL
  Administered 2013-06-15: 1 via NASAL
  Filled 2013-06-10 (×2): qty 16

## 2013-06-10 MED ORDER — FUROSEMIDE 20 MG PO TABS: 20.0000 mg | ORAL_TABLET | Freq: Every day | ORAL | Status: DC | PRN

## 2013-06-10 MED ORDER — PIPERACILLIN-TAZOBACTAM 3.375 G IVPB
INTRAVENOUS | Status: AC
  Filled 2013-06-10: qty 100

## 2013-06-10 MED ORDER — VANCOMYCIN HCL 10 G IV SOLR
1500.0000 mg | Freq: Two times a day (BID) | INTRAVENOUS | Status: DC
  Administered 2013-06-11 – 2013-06-13 (×6): 1500 mg via INTRAVENOUS
  Filled 2013-06-10 (×7): qty 1500

## 2013-06-10 MED ORDER — PIPERACILLIN-TAZOBACTAM 3.375 G IVPB
3.3750 g | Freq: Three times a day (TID) | INTRAVENOUS | Status: DC
  Administered 2013-06-10 – 2013-06-14 (×11): 3.375 g via INTRAVENOUS
  Filled 2013-06-10 (×12): qty 50

## 2013-06-10 MED ORDER — VANCOMYCIN HCL IN DEXTROSE 1-5 GM/200ML-% IV SOLN
1000.0000 mg | Freq: Once | INTRAVENOUS | Status: AC
  Administered 2013-06-10: 1000 mg via INTRAVENOUS
  Filled 2013-06-10: qty 200

## 2013-06-10 MED ORDER — ONDANSETRON HCL 4 MG/2ML IJ SOLN: 4.0000 mg | Freq: Four times a day (QID) | INTRAMUSCULAR | Status: DC | PRN

## 2013-06-10 MED ORDER — HEPARIN SODIUM (PORCINE) 5000 UNIT/ML IJ SOLN
5000.0000 [IU] | Freq: Three times a day (TID) | INTRAMUSCULAR | Status: DC
  Administered 2013-06-10 – 2013-06-15 (×14): 5000 [IU] via SUBCUTANEOUS
  Filled 2013-06-10 (×14): qty 1

## 2013-06-10 NOTE — ED Notes (Addendum)
This morning had sudden onset of feeling cold and arthralgias with pain in lower back.  One episode of vomiting.  Denies dysuria, burning or stinging w/urination.  States he has problems w/recurrent cellulitis.  C/O some mild pain in L leg posteriorly. Temp per EMS 102.3 - was given 1 gram Tylenol.
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
## 2013-06-10 NOTE — Progress Notes (Signed)
Patient c/o pain. Ibuprofen 600 mg is ineffective per patient. Will page on-call midlevel physician, and follow orders given.

## 2013-06-11 DIAGNOSIS — L02419 Cutaneous abscess of limb, unspecified: Principal | ICD-10-CM

## 2013-06-11 DIAGNOSIS — G9589 Other specified diseases of spinal cord: Secondary | ICD-10-CM

## 2013-06-11 DIAGNOSIS — L0291 Cutaneous abscess, unspecified: Secondary | ICD-10-CM | POA: Diagnosis not present

## 2013-06-11 DIAGNOSIS — R609 Edema, unspecified: Secondary | ICD-10-CM | POA: Diagnosis not present

## 2013-06-11 DIAGNOSIS — L03119 Cellulitis of unspecified part of limb: Principal | ICD-10-CM

## 2013-06-11 DIAGNOSIS — I890 Lymphedema, not elsewhere classified: Secondary | ICD-10-CM | POA: Diagnosis not present

## 2013-06-11 LAB — CBC
HCT: 37.3 % — ABNORMAL LOW (ref 39.0–52.0)
HEMOGLOBIN: 12.3 g/dL — AB (ref 13.0–17.0)
MCH: 30.1 pg (ref 26.0–34.0)
MCHC: 33 g/dL (ref 30.0–36.0)
MCV: 91.2 fL (ref 78.0–100.0)
Platelets: 152 10*3/uL (ref 150–400)
RBC: 4.09 MIL/uL — ABNORMAL LOW (ref 4.22–5.81)
RDW: 13.1 % (ref 11.5–15.5)
WBC: 21.7 10*3/uL — ABNORMAL HIGH (ref 4.0–10.5)

## 2013-06-11 LAB — TSH: TSH: 0.749 u[IU]/mL (ref 0.350–4.500)

## 2013-06-11 LAB — COMPREHENSIVE METABOLIC PANEL
ALK PHOS: 68 U/L (ref 39–117)
ALT: 15 U/L (ref 0–53)
AST: 23 U/L (ref 0–37)
Albumin: 3 g/dL — ABNORMAL LOW (ref 3.5–5.2)
BILIRUBIN TOTAL: 1.1 mg/dL (ref 0.3–1.2)
BUN: 15 mg/dL (ref 6–23)
CO2: 25 mEq/L (ref 19–32)
Calcium: 8.7 mg/dL (ref 8.4–10.5)
Chloride: 101 mEq/L (ref 96–112)
Creatinine, Ser: 1.22 mg/dL (ref 0.50–1.35)
GFR calc non Af Amer: 71 mL/min — ABNORMAL LOW (ref >90)
GFR, EST AFRICAN AMERICAN: 82 mL/min — AB (ref >90)
Glucose, Bld: 130 mg/dL — ABNORMAL HIGH (ref 70–99)
POTASSIUM: 3.6 meq/L — AB (ref 3.7–5.3)
SODIUM: 138 meq/L (ref 137–147)
Total Protein: 6.7 g/dL (ref 6.0–8.3)

## 2013-06-11 MED ORDER — ACETAMINOPHEN 325 MG PO TABS
650.0000 mg | ORAL_TABLET | ORAL | Status: DC | PRN
  Administered 2013-06-11: 650 mg via ORAL
  Filled 2013-06-11: qty 2
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
## 2013-06-11 NOTE — Progress Notes (Signed)
Patient has a temp of 102.3 per NT. Paged on-call midlevel MD to notify. Will follow orders and continue to monitor this patient.

## 2013-06-12 DIAGNOSIS — L02419 Cutaneous abscess of limb, unspecified: Secondary | ICD-10-CM | POA: Diagnosis not present

## 2013-06-12 DIAGNOSIS — I890 Lymphedema, not elsewhere classified: Secondary | ICD-10-CM | POA: Diagnosis not present

## 2013-06-12 DIAGNOSIS — R609 Edema, unspecified: Secondary | ICD-10-CM | POA: Diagnosis not present

## 2013-06-12 LAB — BASIC METABOLIC PANEL
BUN: 9 mg/dL (ref 6–23)
CALCIUM: 8.7 mg/dL (ref 8.4–10.5)
CO2: 24 mEq/L (ref 19–32)
CREATININE: 1 mg/dL (ref 0.50–1.35)
Chloride: 104 mEq/L (ref 96–112)
GFR calc non Af Amer: 90 mL/min — ABNORMAL LOW (ref >90)
Glucose, Bld: 115 mg/dL — ABNORMAL HIGH (ref 70–99)
Potassium: 3.9 mEq/L (ref 3.7–5.3)
Sodium: 137 mEq/L (ref 137–147)

## 2013-06-12 LAB — CBC
HEMATOCRIT: 37 % — AB (ref 39.0–52.0)
Hemoglobin: 12.3 g/dL — ABNORMAL LOW (ref 13.0–17.0)
MCH: 30.3 pg (ref 26.0–34.0)
MCHC: 33.2 g/dL (ref 30.0–36.0)
MCV: 91.1 fL (ref 78.0–100.0)
Platelets: 141 10*3/uL — ABNORMAL LOW (ref 150–400)
RBC: 4.06 MIL/uL — ABNORMAL LOW (ref 4.22–5.81)
RDW: 13.3 % (ref 11.5–15.5)
WBC: 12.7 10*3/uL — ABNORMAL HIGH (ref 4.0–10.5)

## 2013-06-12 LAB — HEMOGLOBIN A1C
Hgb A1c MFr Bld: 5.5 % (ref <5.7)
Mean Plasma Glucose: 111 mg/dL (ref <117)

## 2013-06-12 NOTE — Progress Notes (Signed)
Utilization review Completed Catlin Aycock RN BSN   
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
## 2013-06-13 DIAGNOSIS — L0291 Cutaneous abscess, unspecified: Secondary | ICD-10-CM | POA: Diagnosis not present

## 2013-06-13 DIAGNOSIS — L02419 Cutaneous abscess of limb, unspecified: Secondary | ICD-10-CM | POA: Diagnosis not present

## 2013-06-13 DIAGNOSIS — I890 Lymphedema, not elsewhere classified: Secondary | ICD-10-CM | POA: Diagnosis not present

## 2013-06-13 DIAGNOSIS — R609 Edema, unspecified: Secondary | ICD-10-CM | POA: Diagnosis not present

## 2013-06-13 LAB — CBC WITH DIFFERENTIAL/PLATELET
BASOS ABS: 0 10*3/uL (ref 0.0–0.1)
Basophils Relative: 0 % (ref 0–1)
EOS ABS: 0.2 10*3/uL (ref 0.0–0.7)
EOS PCT: 2 % (ref 0–5)
HCT: 36.7 % — ABNORMAL LOW (ref 39.0–52.0)
Hemoglobin: 12 g/dL — ABNORMAL LOW (ref 13.0–17.0)
LYMPHS PCT: 16 % (ref 12–46)
Lymphs Abs: 1.6 10*3/uL (ref 0.7–4.0)
MCH: 29.7 pg (ref 26.0–34.0)
MCHC: 32.7 g/dL (ref 30.0–36.0)
MCV: 90.8 fL (ref 78.0–100.0)
Monocytes Absolute: 0.7 10*3/uL (ref 0.1–1.0)
Monocytes Relative: 7 % (ref 3–12)
Neutro Abs: 7.3 10*3/uL (ref 1.7–7.7)
Neutrophils Relative %: 75 % (ref 43–77)
PLATELETS: 153 10*3/uL (ref 150–400)
RBC: 4.04 MIL/uL — ABNORMAL LOW (ref 4.22–5.81)
RDW: 13.1 % (ref 11.5–15.5)
WBC: 9.9 10*3/uL (ref 4.0–10.5)

## 2013-06-13 LAB — BASIC METABOLIC PANEL
BUN: 8 mg/dL (ref 6–23)
CALCIUM: 8.7 mg/dL (ref 8.4–10.5)
CO2: 24 meq/L (ref 19–32)
CREATININE: 1.01 mg/dL (ref 0.50–1.35)
Chloride: 102 mEq/L (ref 96–112)
GFR calc Af Amer: >90 mL/min (ref >90)
GFR calc non Af Amer: 89 mL/min — ABNORMAL LOW (ref >90)
Glucose, Bld: 106 mg/dL — ABNORMAL HIGH (ref 70–99)
Potassium: 4 mEq/L (ref 3.7–5.3)
Sodium: 139 mEq/L (ref 137–147)

## 2013-06-13 LAB — VANCOMYCIN, TROUGH: VANCOMYCIN TR: 7 ug/mL — AB (ref 10.0–20.0)

## 2013-06-13 LAB — GLUCOSE, CAPILLARY: Glucose-Capillary: 98 mg/dL (ref 70–99)
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
## 2013-06-13 NOTE — Care Management Note (Addendum)
    Page 1 of 1   06/15/2013     11:16:25 AM CARE MANAGEMENT NOTE 06/15/2013  Patient:  Jordan Jensen, Jordan Jensen   Account Number:  [id number]  Date Initiated:  06/13/2013  Documentation initiated by:  Sharrie Rothman  Subjective/Objective Assessment:   Pt admitted from home with cellulitis. Pt lives with his wife and will return home at discharge. Pt uses crutches to ambulate. Otherwise pt is fairly independent.     Action/Plan:   No CM needs noted.   Anticipated DC Date:  06/14/2013   Anticipated DC Plan:  HOME/SELF CARE      DC Planning Services  CM consult  Other      Choice offered to / List presented to:             Status of service:  Completed, signed off Medicare Important Message given?  YES (If response is "NO", the following Medicare IM given date fields will be blank) Date Medicare IM given:  06/15/2013 Date Additional Medicare IM given:    Discharge Disposition:  HOME/SELF CARE  Per UR Regulation:    If discussed at Long Length of Stay Meetings, dates discussed:    Comments:  06/15/13 1110 Arlyss Queen, RN BSN CM Pt to be discharged home today. Appt arranged with PT dept for lymphedema appt and documented on AVS. No other CM needs noted.  06/13/13 1220 Arlyss Queen, RN BSN CM
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
## 2013-06-14 DIAGNOSIS — E669 Obesity, unspecified: Secondary | ICD-10-CM | POA: Diagnosis not present

## 2013-06-14 DIAGNOSIS — R609 Edema, unspecified: Secondary | ICD-10-CM | POA: Diagnosis not present

## 2013-06-14 DIAGNOSIS — I890 Lymphedema, not elsewhere classified: Secondary | ICD-10-CM | POA: Diagnosis not present

## 2013-06-14 DIAGNOSIS — L0291 Cutaneous abscess, unspecified: Secondary | ICD-10-CM | POA: Diagnosis not present

## 2013-06-14 MED ORDER — CLINDAMYCIN HCL 300 MG PO CAPS: 300.0000 mg | ORAL_CAPSULE | Freq: Three times a day (TID) | ORAL | Status: DC

## 2013-06-14 MED ORDER — ACETAMINOPHEN 325 MG PO TABS: 650.0000 mg | ORAL_TABLET | ORAL | Status: DC | PRN

## 2013-06-14 MED ORDER — CLINDAMYCIN HCL 150 MG PO CAPS
300.0000 mg | ORAL_CAPSULE | Freq: Three times a day (TID) | ORAL | Status: DC
  Administered 2013-06-14 – 2013-06-15 (×4): 300 mg via ORAL
  Filled 2013-06-14 (×4): qty 2
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
## 2013-06-14 NOTE — Progress Notes (Signed)
Physical Therapy Treatment Patient Details Name: Jordan Jensen MRN: [id number] DOB: 26-May-1970 Today's Date: 07-01-2013   Evaluation of mobility done earlier please see note.  Pt has had chronic lymphedma and had been seen for this in an outpatient setting.  Pt has short stretch bandages, garment and lymphedema pump at home.  Pt is to be discharged today.  We do not keep short stretch bandages therefore pt was wrapped with profore lite.  Profore lite was used instead of profore secondary to pt being paraplegic and having decreased sensation along with pt being discharged today.  Recommend pt to be referred to outpatient therapy to have manual lymph techniques to decrease lymphedma.  Therapist and pt spoke at length about the importance of keeping LE wrapped at all time and completing self massages.     Lt LE used 2 profore lite Rt LE used 1 profore lite.    Time: 3810-1751 PT Time Calculation (min): 37 min  charges:  $Self Care/Home Management: 38-52                    G Codes:      Bella Kennedy 07/01/2013, 4:41 PM
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
## 2013-06-15 LAB — BASIC METABOLIC PANEL
BUN: 9 mg/dL (ref 6–23)
CALCIUM: 9.1 mg/dL (ref 8.4–10.5)
CO2: 27 mEq/L (ref 19–32)
Chloride: 102 mEq/L (ref 96–112)
Creatinine, Ser: 1.09 mg/dL (ref 0.50–1.35)
GFR calc Af Amer: >90 mL/min (ref >90)
GFR calc non Af Amer: 81 mL/min — ABNORMAL LOW (ref >90)
GLUCOSE: 91 mg/dL (ref 70–99)
Potassium: 4 mEq/L (ref 3.7–5.3)
Sodium: 141 mEq/L (ref 137–147)

## 2013-06-15 LAB — CULTURE, BLOOD (ROUTINE X 2)
Culture: NO GROWTH
Culture: NO GROWTH

## 2013-06-15 NOTE — Discharge Planning (Signed)
Pt stated he was ready to go home and pain was under control.  Pt's IV removed and pt given DC papers, told of FU appointments and that scripts were sent to his pharm.  Pt educated s/sx of future cellulitis.  Also explained the importance of correctly weighing himself daily and when to call the doctor for increased weights noted. Pt still waiting on ride to arrive, but will be wheeled out to car when ready.

## 2013-06-15 NOTE — Progress Notes (Signed)
OT Cancellation Note  Patient Details Name: Jordan Jensen MRN: [id number] DOB: 01-04-1971   Cancelled Treatment:    Reason Eval/Treat Not Completed: OT screened, no needs identified, will sign off.  Pt verbalizes being at baseline with all ADL and IADL needs.  Pt had questions about shoe horn and stocking donner - OTR provided information about where to purchase.  Pt needs no further OT services at this time.  Marry Guan, MS, OTR/L [phone]  06/15/2013, 8:31 AM

## 2013-06-16 NOTE — Progress Notes (Signed)
UR chart review completed.  

## 2013-06-28 ENCOUNTER — Ambulatory Visit (HOSPITAL_COMMUNITY)
Admit: 2013-06-28 | Discharge: 2013-06-28 | Disposition: A | Discharge status: Home / Self Care | Payer: Medicare Other | Source: Ambulatory Visit | Attending: Internal Medicine | Admitting: Internal Medicine

## 2013-06-28 DIAGNOSIS — I890 Lymphedema, not elsewhere classified: Secondary | ICD-10-CM | POA: Diagnosis not present

## 2013-06-28 DIAGNOSIS — IMO0002 Reserved for concepts with insufficient information to code with codable children: Secondary | ICD-10-CM | POA: Diagnosis not present

## 2013-06-28 DIAGNOSIS — E669 Obesity, unspecified: Secondary | ICD-10-CM | POA: Insufficient documentation

## 2013-06-28 DIAGNOSIS — R609 Edema, unspecified: Secondary | ICD-10-CM

## 2013-06-28 DIAGNOSIS — G8220 Paraplegia, unspecified: Secondary | ICD-10-CM | POA: Diagnosis not present

## 2013-06-28 DIAGNOSIS — IMO0001 Reserved for inherently not codable concepts without codable children: Secondary | ICD-10-CM | POA: Diagnosis not present

## 2013-06-28 IMAGING — CR DG CHEST 1V PORT
1 series · 1 of 1 positions shown · non-contrast
Comparison: 02/16/2012

CLINICAL DATA: PICC line placement

PORTABLE CHEST - 1 VIEW

[view not recorded]
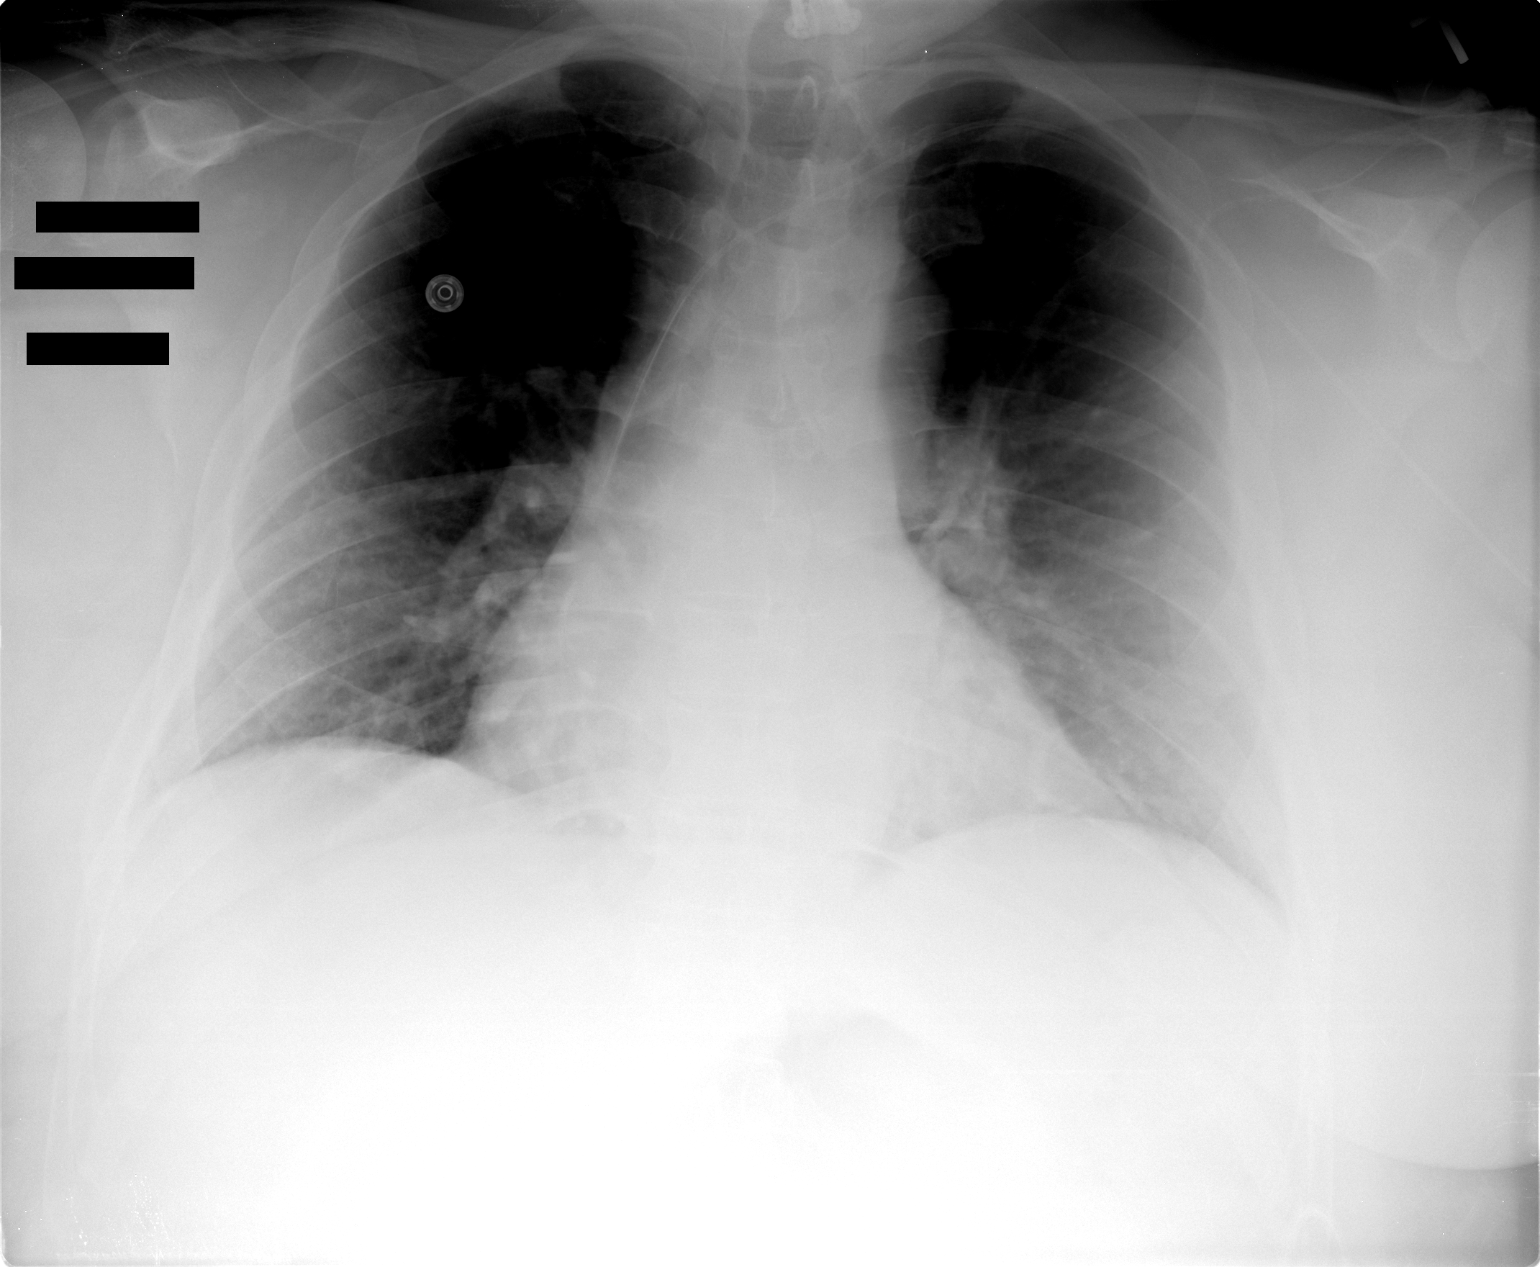

[1 of 1 positions shown; findings below may reference images not displayed]

FINDINGS: Left arm PICC has its tip in the SVC 2 cm above the right
atrium.  Lungs remain clear.
IMPRESSION: Picc well positioned with its tip in the SVC 2 cm above the right
atrium.
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
## 2013-06-30 ENCOUNTER — Ambulatory Visit (HOSPITAL_COMMUNITY)
Admission: RE | Admit: 2013-06-30 | Discharge: 2013-06-30 | Disposition: A | Discharge status: Home / Self Care | Payer: Medicare Other | Source: Ambulatory Visit | Attending: Family Medicine | Admitting: Family Medicine

## 2013-06-30 DIAGNOSIS — IMO0002 Reserved for concepts with insufficient information to code with codable children: Secondary | ICD-10-CM | POA: Diagnosis not present

## 2013-06-30 DIAGNOSIS — E669 Obesity, unspecified: Secondary | ICD-10-CM | POA: Diagnosis not present

## 2013-06-30 DIAGNOSIS — G8220 Paraplegia, unspecified: Secondary | ICD-10-CM | POA: Diagnosis not present

## 2013-06-30 DIAGNOSIS — I890 Lymphedema, not elsewhere classified: Secondary | ICD-10-CM | POA: Diagnosis not present

## 2013-06-30 DIAGNOSIS — IMO0001 Reserved for inherently not codable concepts without codable children: Secondary | ICD-10-CM | POA: Diagnosis not present
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
## 2013-07-05 ENCOUNTER — Telehealth: Payer: Self-pay | Admitting: Family Medicine

## 2013-07-05 ENCOUNTER — Ambulatory Visit (HOSPITAL_COMMUNITY)
Admission: RE | Admit: 2013-07-05 | Discharge: 2013-07-05 | Disposition: A | Discharge status: Home / Self Care | Payer: Medicare Other | Source: Ambulatory Visit | Attending: Internal Medicine | Admitting: Internal Medicine

## 2013-07-05 DIAGNOSIS — IMO0002 Reserved for concepts with insufficient information to code with codable children: Secondary | ICD-10-CM | POA: Diagnosis not present

## 2013-07-05 DIAGNOSIS — IMO0001 Reserved for inherently not codable concepts without codable children: Secondary | ICD-10-CM | POA: Diagnosis not present

## 2013-07-05 DIAGNOSIS — I890 Lymphedema, not elsewhere classified: Secondary | ICD-10-CM | POA: Diagnosis not present

## 2013-07-05 DIAGNOSIS — G8220 Paraplegia, unspecified: Secondary | ICD-10-CM | POA: Diagnosis not present

## 2013-07-05 DIAGNOSIS — E669 Obesity, unspecified: Secondary | ICD-10-CM | POA: Diagnosis not present

## 2013-07-05 NOTE — Telephone Encounter (Signed)
Need a rx written for patient to have crutches faxed to his pharmacy.  Inform when sent
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
## 2013-07-06 ENCOUNTER — Ambulatory Visit (HOSPITAL_COMMUNITY): Payer: Medicare Other | Admitting: Physical Therapy

## 2013-07-07 ENCOUNTER — Ambulatory Visit (HOSPITAL_COMMUNITY)
Admission: RE | Admit: 2013-07-07 | Discharge: 2013-07-07 | Disposition: A | Discharge status: Home / Self Care | Payer: Medicare Other | Source: Ambulatory Visit | Attending: Family Medicine | Admitting: Family Medicine

## 2013-07-07 DIAGNOSIS — G8220 Paraplegia, unspecified: Secondary | ICD-10-CM | POA: Diagnosis not present

## 2013-07-07 DIAGNOSIS — E669 Obesity, unspecified: Secondary | ICD-10-CM | POA: Insufficient documentation

## 2013-07-07 DIAGNOSIS — I890 Lymphedema, not elsewhere classified: Secondary | ICD-10-CM | POA: Insufficient documentation

## 2013-07-07 DIAGNOSIS — IMO0001 Reserved for inherently not codable concepts without codable children: Secondary | ICD-10-CM | POA: Insufficient documentation

## 2013-07-07 DIAGNOSIS — IMO0002 Reserved for concepts with insufficient information to code with codable children: Secondary | ICD-10-CM | POA: Diagnosis not present

## 2013-07-07 NOTE — Progress Notes (Signed)
Physical Therapy Treatment Patient Details  Name: Jordan Jensen MRN: [id number] Date of Birth: Dec 31, 1970  Today's Date: 07/07/2013 Time: 0930-1045 PT Time Calculation (min): 75 min  Visit#: 4 of 12  Re-eval: 07/28/13  Authorization: medicare/medicaid  Authorization Visit#: 4 of 10  Charges:  Manual 70  Subjective: Symptoms/Limitations Symptoms: Pt reports he had a good MD visit yesterday.  Pt reports overall improvement of his LE's with current treatment.  No pain reported.  LE's measured today. Pain Assessment Currently in Pain?: No/denies   Objective: Manual Therapy Manual Therapy: Edema management Edema Management: Pt received manual decongestive therapy for bilateral LE's including supraclavicular, deep and superficial abdominal. Lymph routed from bilateral LE's using inguinal-axillary anastomosis anteriorly only. Pt Lt LE was then moisturized and bandaged using 1/2" foam and multilayer short stretch bandages Date 07/07/2013    Right Left  MCP 26.50 27.00  WRIST 36.4 39  4cm 33.80 41.50  8cm 37.20 53.80  12 cm 40.00 57.00  16cm 42.90 60.00  20cm 46.80 62.70  24cm 55.00 63.50  28cm 58.00 64.00  32cm 59.00 63.00  36cm 51.70 53.30          Sum of squares 22727.03 32585.12  Total Volume 7234.241 10372.17   Volumes on 06/28/13:  Right:  7453.028cc  Left: 11464.135cc    Physical Therapy Assessment and Plan PT Assessment and Plan Clinical Impression Statement: Bilateral LE's remeasured today with overall improvement in both LE's.  Pt has lost 1091.97cc from Lt LE and 218.79cc from Rt LE.  Discussed with patient importance of wearing his reidsleeves at night when removes bandages.  Pt currently reports he is only wearing 2X week.  Also discussed importance of performing self massage with focus on inguinal, axillary and deep abdominal nodes when LE's are in compression pump.  Pt verbalized understanding.  PT Plan: complete manual lymph drainage and bandaging techniques.  Measure  once weekly (Thursdays).     Problem List Patient Active Problem List   Diagnosis Date Noted  . Edema 06/28/2013  . Cellulitis 06/10/2013  . Cellulitis and abscess of leg 06/10/2013  . Cough 06/06/2013  . Low back pain 03/23/2013  . Left foot pain 07/24/2012  . Recurrent cellulitis of lower leg 05/05/2012  . Pain in joint, shoulder region 03/16/2012  . Lymphedema 11/07/2011  . Obesity 07/05/2011  . Snoring 07/05/2011  . Bilateral leg edema   . Post traumatic myelopathy        Lurena Nida, PTA/CLT 07/07/2013, 1:46 PM

## 2013-07-11 ENCOUNTER — Ambulatory Visit (HOSPITAL_COMMUNITY)
Admission: RE | Admit: 2013-07-11 | Discharge: 2013-07-11 | Disposition: A | Discharge status: Home / Self Care | Payer: Medicare Other | Source: Ambulatory Visit | Attending: *Deleted | Admitting: *Deleted

## 2013-07-11 DIAGNOSIS — IMO0001 Reserved for inherently not codable concepts without codable children: Secondary | ICD-10-CM | POA: Diagnosis not present

## 2013-07-11 NOTE — Progress Notes (Signed)
Physical Therapy Treatment Patient Details  Name: Jordan Jensen MRN: [id number] Date of Birth: [birth year]  Today's Date: 07/11/2013 Time: 1440-1540 PT Time Calculation (min): 60 min  Visit#: 5 of 12  Re-eval: 07/28/13 Authorization: medicare/medicaid  Authorization Visit#: 5 of 10  Charges:  Manual 60  Subjective: Symptoms/Limitations Symptoms: Pt states he kept his bandages on a couple days after last visit.  STates his legs feel much softer and less full. Pain Assessment Currently in Pain?: No/denies   Objective: Manual Therapy Manual Therapy: Edema management Edema Management: Pt received manual decongestive therapy for bilateral LE's including supraclavicular, deep and superficial abdominal. Lymph routed from bilateral LE's using inguinal-axillary anastomosis anteriorly only. Pt Lt LE was then moisturized and bandaged using 1/2" foam and multilayer short stretch bandages  Physical Therapy Assessment and Plan PT Assessment and Plan Clinical Impression Statement: Very little induration present posterior Lt LE, no other areas of induration.  Cotton used at medial ankle in between skin folds to encourage decongestion.  Pt doing better job keeping compression on at night.  LE's continue to decompress and respond well to therapy. PT Plan: complete manual lymph drainage and bandaging techniques.  Measure once weekly (Thursdays).     Problem List Patient Active Problem List   Diagnosis Date Noted  . Edema 06/28/2013  . Cellulitis 06/10/2013  . Cellulitis and abscess of leg 06/10/2013  . Cough 06/06/2013  . Low back pain 03/23/2013  . Left foot pain 07/24/2012  . Recurrent cellulitis of lower leg 05/05/2012  . Pain in joint, shoulder region 03/16/2012  . Lymphedema 11/07/2011  . Obesity 07/05/2011  . Snoring 07/05/2011  . Bilateral leg edema   . Post traumatic myelopathy       Lurena Nida, PTA/CLT 07/11/2013, 3:53 PM

## 2013-07-13 ENCOUNTER — Ambulatory Visit (HOSPITAL_COMMUNITY): Payer: Medicare Other | Admitting: Physical Therapy

## 2013-07-13 ENCOUNTER — Telehealth (HOSPITAL_COMMUNITY): Payer: Self-pay

## 2013-07-15 ENCOUNTER — Ambulatory Visit: Payer: Medicare Other | Admitting: Family Medicine

## 2013-07-18 ENCOUNTER — Ambulatory Visit (HOSPITAL_COMMUNITY)
Admission: RE | Admit: 2013-07-18 | Discharge: 2013-07-18 | Disposition: A | Discharge status: Home / Self Care | Payer: Medicare Other | Source: Ambulatory Visit | Attending: *Deleted | Admitting: *Deleted

## 2013-07-18 DIAGNOSIS — IMO0001 Reserved for inherently not codable concepts without codable children: Secondary | ICD-10-CM | POA: Diagnosis not present

## 2013-07-18 NOTE — Telephone Encounter (Signed)
Patient called again about RX for crutches. Was sent old PCP on last day of residency and nothing was ever done. Please advise.

## 2013-07-18 NOTE — Progress Notes (Signed)
Physical Therapy Treatment Patient Details  Name: Jordan Jensen MRN: [id number] Date of Birth: 06/30/1970  Today's Date: 07/18/2013 Time: 1440-1540 PT Time Calculation (min): 60 min  Visit#: 6 of 12  Re-eval: 07/28/13 Authorization: medicare/medicaid  Authorization Visit#: 6 of 10  Charges:  Manual 55  Subjective: Symptoms/Limitations Symptoms: Wife with patient today and states she can really tell and improvment and his leg has went down alot Pain Assessment Currently in Pain?: No/denies   Objective:  Manual Therapy Manual Therapy: Edema management Edema Management: Pt received manual decongestive therapy for bilateral LE's including supraclavicular, deep and superficial abdominal. Lymph routed from bilateral LE's using inguinal-axillary anastomosis anteriorly only. Pt Lt LE was then moisturized and bandaged using 1/2" foam and multilayer short stretch bandages  Physical Therapy Assessment and Plan PT Assessment and Plan Clinical Impression Statement: Lt LE is progressing well in response to therapy.  Pt is also going to vocational rehab in hopes to gain employment.  Continues to have most swelling and induration medial posterior Lt LE and ankle.  PT Plan: complete manual lymph drainage and bandaging techniques.  Measure once weekly (Thursdays).     Problem List Patient Active Problem List   Diagnosis Date Noted  . Edema 06/28/2013  . Cellulitis 06/10/2013  . Cellulitis and abscess of leg 06/10/2013  . Cough 06/06/2013  . Low back pain 03/23/2013  . Left foot pain 07/24/2012  . Recurrent cellulitis of lower leg 05/05/2012  . Pain in joint, shoulder region 03/16/2012  . Lymphedema 11/07/2011  . Obesity 07/05/2011  . Snoring 07/05/2011  . Bilateral leg edema   . Post traumatic myelopathy        Lurena Nida, PTA/CLT 07/18/2013, 3:48 PM

## 2013-07-19 ENCOUNTER — Telehealth: Payer: Self-pay | Admitting: Family Medicine

## 2013-07-19 NOTE — Telephone Encounter (Signed)
Pt called and wanted to know what the status of his crutches was. Please call him because he has been waiting since 6/30 for this. jw

## 2013-07-19 NOTE — Telephone Encounter (Signed)
Will forward to preceptor to see if they will write this for patient. Dyshon Philbin,CMA

## 2013-07-19 NOTE — Telephone Encounter (Signed)
Have patient follow up with his PCP or any resident available to address this.

## 2013-07-20 ENCOUNTER — Inpatient Hospital Stay (HOSPITAL_COMMUNITY): Admission: RE | Admit: 2013-07-20 | Payer: Medicare Other | Source: Ambulatory Visit | Admitting: Physical Therapy

## 2013-07-20 NOTE — Telephone Encounter (Signed)
Sorry, can't help with this one anymore. Please fwd to new PCP

## 2013-07-21 ENCOUNTER — Telehealth: Payer: Self-pay | Admitting: *Deleted

## 2013-07-21 ENCOUNTER — Telehealth: Payer: Self-pay | Admitting: Family Medicine

## 2013-07-21 ENCOUNTER — Encounter: Payer: Self-pay | Admitting: Family Medicine

## 2013-07-21 ENCOUNTER — Ambulatory Visit (INDEPENDENT_AMBULATORY_CARE_PROVIDER_SITE_OTHER): Payer: Medicare Other | Admitting: Family Medicine

## 2013-07-21 VITALS — BP 115/67 | HR 101 | Temp 99.2°F | Wt 370.0 lb

## 2013-07-21 DIAGNOSIS — R8290 Unspecified abnormal findings in urine: Secondary | ICD-10-CM

## 2013-07-21 DIAGNOSIS — L03119 Cellulitis of unspecified part of limb: Secondary | ICD-10-CM

## 2013-07-21 DIAGNOSIS — L02419 Cutaneous abscess of limb, unspecified: Secondary | ICD-10-CM

## 2013-07-21 DIAGNOSIS — Z872 Personal history of diseases of the skin and subcutaneous tissue: Secondary | ICD-10-CM | POA: Diagnosis not present

## 2013-07-21 DIAGNOSIS — R82998 Other abnormal findings in urine: Secondary | ICD-10-CM | POA: Diagnosis not present

## 2013-07-21 DIAGNOSIS — M545 Low back pain, unspecified: Secondary | ICD-10-CM

## 2013-07-21 DIAGNOSIS — N390 Urinary tract infection, site not specified: Secondary | ICD-10-CM

## 2013-07-21 DIAGNOSIS — R609 Edema, unspecified: Secondary | ICD-10-CM | POA: Diagnosis not present

## 2013-07-21 LAB — POCT URINALYSIS DIPSTICK
Glucose, UA: NEGATIVE
Leukocytes, UA: NEGATIVE
Nitrite, UA: POSITIVE
PROTEIN UA: 100
SPEC GRAV UA: 1.02
Urobilinogen, UA: 1
pH, UA: 7

## 2013-07-21 LAB — CBC
HEMATOCRIT: 38.9 % — AB (ref 39.0–52.0)
Hemoglobin: 13.6 g/dL (ref 13.0–17.0)
MCH: 30.2 pg (ref 26.0–34.0)
MCHC: 35 g/dL (ref 30.0–36.0)
MCV: 86.3 fL (ref 78.0–100.0)
Platelets: 161 10*3/uL (ref 150–400)
RBC: 4.51 MIL/uL (ref 4.22–5.81)
RDW: 13.7 % (ref 11.5–15.5)
WBC: 28.8 10*3/uL — ABNORMAL HIGH (ref 4.0–10.5)

## 2013-07-21 LAB — POCT UA - MICROSCOPIC ONLY

## 2013-07-21 MED ORDER — CIPROFLOXACIN HCL 500 MG PO TABS: 500.0000 mg | ORAL_TABLET | Freq: Two times a day (BID) | ORAL | Status: DC

## 2013-07-21 NOTE — Assessment & Plan Note (Addendum)
UA concerning for UTI. Will treat with cipro. UCx collected. Given return precautions.  Addendum 07/22/13: WBC elevated on check to 28.8, given UA findings this raises the concern for a pyelonephritis, though patient did not report fever or abdominal discomfort or systemic symptoms and did not have CVA tenderness to indicate this level of infection. Additionally no other site of infection were evident on exam. I called the patient the morning of 07/22/13 to discuss this issue and he reported feeling improved today compared to yesterday. I again discussed return precautions with the patient and advised that he go to the ED for evaluation if he did not continue to feel better or if he developed fever.
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
## 2013-07-21 NOTE — Assessment & Plan Note (Addendum)
Patient completed treatment for cellulitis. Has no signs or symptoms at this time. Infection appears resolved. Discharge summary requested repeat CBC and BMET. Will order those today. Will continue to monitor for recurrence moving forward.

## 2013-07-21 NOTE — Patient Instructions (Signed)
Nice to meet you. We will check your urine to see if there is an infection. We will call you with the results. If you develop a fever, worsening back pain, change in strength or sensation in your legs please let us know.  Back Pain, Adult Back pain is very common. The pain often gets better over time. The cause of back pain is usually not dangerous. Most people can learn to manage their back pain on their own.  HOME CARE   Stay active. Start with short walks on flat ground if you can. Try to walk farther each day.  Do not sit, drive, or stand in one place for more than 30 minutes. Do not stay in bed.  Do not avoid exercise or work. Activity can help your back heal faster.  Be careful when you bend or lift an object. Bend at your knees, keep the object close to you, and do not twist.  Sleep on a firm mattress. Lie on your side, and bend your knees. If you lie on your back, put a pillow under your knees.  Only take medicines as told by your doctor.  Put ice on the injured area.  Put ice in a plastic bag.  Place a towel between your skin and the bag.  Leave the ice on for 15-20 minutes, 03-04 times a day for the first 2 to 3 days. After that, you can switch between ice and heat packs.  Ask your doctor about back exercises or massage.  Avoid feeling anxious or stressed. Find good ways to deal with stress, such as exercise. GET HELP RIGHT AWAY IF:   Your pain does not go away with rest or medicine.  Your pain does not go away in 1 week.  You have new problems.  You do not feel well.  The pain spreads into your legs.  You cannot control when you poop (bowel movement) or pee (urinate).  Your arms or legs feel weak or lose feeling (numbness).  You feel sick to your stomach (nauseous) or throw up (vomit).  You have belly (abdominal) pain.  You feel like you may pass out (faint). MAKE SURE YOU:   Understand these instructions.  Will watch your condition.  Will get help  right away if you are not doing well or get worse. Document Released: 06/11/2007 Document Revised: 03/17/2011 Document Reviewed: 05/13/2010 [facility] Patient Information 2015 [place], [place]. This information is not intended to replace advice given to you by your health care provider. Make sure you discuss any questions you have with your health care provider.
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
## 2013-07-21 NOTE — Assessment & Plan Note (Addendum)
Patient with onset of back pain this morning. He notes it feels like a urine infection. UA with positive nitrites. Will check urine culture. Will treat with cipro 500 mg BID for 7 days. He does have a significant spinal injury history so may be related to that. No red flags on exam or history. To use ibuprofen 600 mg q6 hr prn. Discussed use of muscle relaxer though patient declined at this time. If treatment of UTI does not improve pain will need to consider additional MSK causes and consider PT if not improving.

## 2013-07-21 NOTE — Progress Notes (Addendum)
Patient ID: Jordan Jensen, male   DOB: 05-03-1970, 43 y.o.   MRN: [id number]  Marikay Alar, MD Phone: [phone]  Jordan Jensen is a 43 y.o. male who presents today for f/u.  Cellulitis LLE hospital f/u: patient was hospitalized in June for cellulitis. He was on IV vanc and zosyn, then transitioned to PO clindamycin. He reports that this has resolved. He finished his antibiotics. He has continued swelling in his LLE related to lymphedema, though has not had any erythema or pain in this area. He has been going to therapy to have his leg wrapped to help with his lymph edema.  Back pain: patient notes he woke up this morning with pain in his low back. It is bilateral pain. It was excruciating pain 10/10 this morning. He notes the pain improved on going to the bathroom. He notes his urine has been darker today. Denies dysuria. Notes a chill this morning with the pain, though no fevers. No change in his neurologic status of his LE. No saddle anesthesia, no bowel or bladder dysfunction. He states the last time he had this pain he had a UTI.  Patient also reports needing a prescription for crutches. He uses a wheel chair outside of his home, though while at home he uses crutches with regard to his prior spinal cord injury.  Patient is a former smoker.   ROS: Per HPI   Physical Exam Filed Vitals:   07/21/13 1157  BP: 115/67  Pulse: 101  Temp: 99.2 F (37.3 C)    Gen: Well NAD Lungs: CTABL Nl WOB Heart: RRR no MRG Abd: soft, NT, ND MSK: no midline tenderness, bilateral paraspinous muscle tenderness, no swelling in this area, no CVA tenderness Neuro: 4+/5 strength in bilateral quads, hamstrings, plantar and dorsiflexion (patient states is at his baseline), sensation to light touch is intact BLE Exts: LLE edematous though non pitting, RLE with less edema than LLE, warm and well perfused.    Assessment/Plan: Please see individual problem list.  # Healthcare maintenance: needs Tdap  discussion at next visit

## 2013-07-21 NOTE — Telephone Encounter (Signed)
Message copied by Osborne Oman on Thu Jul 21, 2013  2:32 PM ------      Message from: Birdie Sons, ERIC G      Created: Thu Jul 21, 2013  1:42 PM       Patient with positive nitrites giving concern for UTI. I sent in an antibiotic for the patient. We will let him know the results of the urine culture when they return. Could you inform him of this? Thanks. ------

## 2013-07-21 NOTE — Telephone Encounter (Signed)
Pt has an appt today with Dr. Birdie Sons to discuss crutches. Jazmin Hartsell,CMA

## 2013-07-21 NOTE — Telephone Encounter (Signed)
LMOVM for pt to call back.  Fleeger, Jessica Dawn  

## 2013-07-22 ENCOUNTER — Telehealth: Payer: Self-pay | Admitting: Family Medicine

## 2013-07-22 ENCOUNTER — Telehealth (HOSPITAL_COMMUNITY): Payer: Self-pay

## 2013-07-22 ENCOUNTER — Ambulatory Visit (HOSPITAL_COMMUNITY): Payer: Medicare Other | Admitting: Physical Therapy

## 2013-07-22 LAB — BASIC METABOLIC PANEL
BUN: 14 mg/dL (ref 6–23)
CO2: 26 meq/L (ref 19–32)
Calcium: 9.1 mg/dL (ref 8.4–10.5)
Chloride: 101 mEq/L (ref 96–112)
Creat: 1.2 mg/dL (ref 0.50–1.35)
Glucose, Bld: 75 mg/dL (ref 70–99)
POTASSIUM: 3.8 meq/L (ref 3.5–5.3)
Sodium: 136 mEq/L (ref 135–145)

## 2013-07-22 LAB — URINE CULTURE
Colony Count: NO GROWTH
Organism ID, Bacteria: NO GROWTH

## 2013-07-22 NOTE — Telephone Encounter (Signed)
Called patient back. Patients urine culture returned with no growth. This makes me more concerned about his "feverish episodes" and his elevated WBC as there is no clear source at this time. I called the patient to discuss this and advised that he go to the ED for further evaluation as we are potentially not treating the cause of these feverish episodes and his elevated WBC. Patient agreed with this plan.

## 2013-07-22 NOTE — Telephone Encounter (Signed)
Called patient back to see how he was doing after reviewing his chart and noticing that he cancelled his rehab appointment due to "DOESN'T FEEL BAD BUT DOESN'T FEEL GOOD. HE IS RUNNING A FEVER AND BACK IS HURTING."  Patient reports that he had a "feverish episode" earlier today after I initially talked to him. He does not know what his temperature was at that time as he has no method of checking. He does not he took his second dose of antibiotic and has not had any more feverish feelings and notes his back pain has gone away at this time. I discussed the concern I had regarding this continued "feverish episode" being that he may have a kidney infection as opposed to having a bladder infection. I advised him that it would be best to be evaluated further in the ED for potential IV antibiotics given these continued symptoms in combination with his elevated WBC. He noted that he would go to the ED once his daughter got home to give him a ride so he could be evaluated.   Of note his last positive urine culture was 12/31/09. Grew out E coli resistant to ampicillin and cefazolin. Sensitive to the other tested antibiotics.

## 2013-07-22 NOTE — Telephone Encounter (Signed)
Called patient to discuss WBC being elevated. Pateitn reports that he is feeling better today. He reports that yesterday he had some chills on 2 occassions, though has not noted any fevers since he left the office. Feels like he is getting better. He notes he picked up the antibiotic this morning and has taken one dose of this. I advised that if he develops fever, nausea, worsening pain, or starts to feel worse that he should go to the ED for further evaluation. Will need to consider urology outpatient follow-up given previous history of UTI in a male and renal US as well.

## 2013-07-25 ENCOUNTER — Ambulatory Visit (HOSPITAL_COMMUNITY)
Admission: RE | Admit: 2013-07-25 | Discharge: 2013-07-25 | Disposition: A | Discharge status: Home / Self Care | Payer: Medicare Other | Source: Ambulatory Visit | Attending: Family Medicine | Admitting: Family Medicine

## 2013-07-25 DIAGNOSIS — IMO0001 Reserved for inherently not codable concepts without codable children: Secondary | ICD-10-CM | POA: Diagnosis not present
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
## 2013-07-27 ENCOUNTER — Ambulatory Visit (HOSPITAL_COMMUNITY)
Admission: RE | Admit: 2013-07-27 | Discharge: 2013-07-27 | Disposition: A | Discharge status: Home / Self Care | Payer: Medicare Other | Source: Ambulatory Visit | Attending: Internal Medicine | Admitting: Internal Medicine

## 2013-07-27 DIAGNOSIS — R609 Edema, unspecified: Secondary | ICD-10-CM

## 2013-07-27 DIAGNOSIS — IMO0001 Reserved for inherently not codable concepts without codable children: Secondary | ICD-10-CM | POA: Diagnosis not present
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
## 2013-07-29 ENCOUNTER — Emergency Department (HOSPITAL_COMMUNITY): Payer: Medicare Other

## 2013-07-29 ENCOUNTER — Encounter (HOSPITAL_COMMUNITY): Payer: Self-pay | Admitting: Emergency Medicine

## 2013-07-29 ENCOUNTER — Ambulatory Visit (HOSPITAL_COMMUNITY): Payer: Medicare Other | Admitting: Physical Therapy

## 2013-07-29 ENCOUNTER — Emergency Department (HOSPITAL_COMMUNITY)
Admission: EM | Admit: 2013-07-29 | Discharge: 2013-07-29 | Disposition: A | Discharge status: Home / Self Care | Payer: Medicare Other | Attending: Emergency Medicine | Admitting: Emergency Medicine

## 2013-07-29 ENCOUNTER — Telehealth: Payer: Self-pay | Admitting: Family Medicine

## 2013-07-29 DIAGNOSIS — I890 Lymphedema, not elsewhere classified: Secondary | ICD-10-CM | POA: Diagnosis not present

## 2013-07-29 DIAGNOSIS — M7989 Other specified soft tissue disorders: Secondary | ICD-10-CM | POA: Insufficient documentation

## 2013-07-29 DIAGNOSIS — Z87891 Personal history of nicotine dependence: Secondary | ICD-10-CM | POA: Insufficient documentation

## 2013-07-29 DIAGNOSIS — I10 Essential (primary) hypertension: Secondary | ICD-10-CM | POA: Diagnosis not present

## 2013-07-29 DIAGNOSIS — R599 Enlarged lymph nodes, unspecified: Secondary | ICD-10-CM | POA: Diagnosis not present

## 2013-07-29 DIAGNOSIS — Z872 Personal history of diseases of the skin and subcutaneous tissue: Secondary | ICD-10-CM | POA: Diagnosis not present

## 2013-07-29 DIAGNOSIS — R609 Edema, unspecified: Secondary | ICD-10-CM

## 2013-07-29 DIAGNOSIS — Z79899 Other long term (current) drug therapy: Secondary | ICD-10-CM | POA: Insufficient documentation

## 2013-07-29 DIAGNOSIS — Z87828 Personal history of other (healed) physical injury and trauma: Secondary | ICD-10-CM | POA: Diagnosis not present

## 2013-07-29 LAB — BASIC METABOLIC PANEL
ANION GAP: 7 (ref 5–15)
BUN: 9 mg/dL (ref 6–23)
CO2: 30 mEq/L (ref 19–32)
Calcium: 9 mg/dL (ref 8.4–10.5)
Chloride: 103 mEq/L (ref 96–112)
Creatinine, Ser: 1.16 mg/dL (ref 0.50–1.35)
GFR calc non Af Amer: 75 mL/min — ABNORMAL LOW (ref >90)
GFR, EST AFRICAN AMERICAN: 87 mL/min — AB (ref >90)
Glucose, Bld: 94 mg/dL (ref 70–99)
POTASSIUM: 4.2 meq/L (ref 3.7–5.3)
SODIUM: 140 meq/L (ref 137–147)

## 2013-07-29 LAB — URINALYSIS, ROUTINE W REFLEX MICROSCOPIC
Bilirubin Urine: NEGATIVE
Glucose, UA: NEGATIVE mg/dL
Hgb urine dipstick: NEGATIVE
Ketones, ur: NEGATIVE mg/dL
Leukocytes, UA: NEGATIVE
Nitrite: NEGATIVE
Protein, ur: NEGATIVE mg/dL
Specific Gravity, Urine: 1.025 (ref 1.005–1.030)
Urobilinogen, UA: 0.2 mg/dL (ref 0.0–1.0)
pH: 7.5 (ref 5.0–8.0)

## 2013-07-29 LAB — CBC WITH DIFFERENTIAL/PLATELET
Basophils Absolute: 0 10*3/uL (ref 0.0–0.1)
Basophils Relative: 0 % (ref 0–1)
Eosinophils Absolute: 0.4 10*3/uL (ref 0.0–0.7)
Eosinophils Relative: 4 % (ref 0–5)
HCT: 39.9 % (ref 39.0–52.0)
Hemoglobin: 13.6 g/dL (ref 13.0–17.0)
Lymphocytes Relative: 30 % (ref 12–46)
Lymphs Abs: 2.9 10*3/uL (ref 0.7–4.0)
MCH: 30.8 pg (ref 26.0–34.0)
MCHC: 34.1 g/dL (ref 30.0–36.0)
MCV: 90.5 fL (ref 78.0–100.0)
Monocytes Absolute: 0.7 10*3/uL (ref 0.1–1.0)
Monocytes Relative: 7 % (ref 3–12)
Neutro Abs: 5.8 10*3/uL (ref 1.7–7.7)
Neutrophils Relative %: 59 % (ref 43–77)
Platelets: 207 10*3/uL (ref 150–400)
RBC: 4.41 MIL/uL (ref 4.22–5.81)
RDW: 12.8 % (ref 11.5–15.5)
WBC: 9.8 10*3/uL (ref 4.0–10.5)

## 2013-07-29 NOTE — ED Notes (Signed)
Pt has lymphedema in LLE, state sit is however more swollen than usual.
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
## 2013-07-29 NOTE — Telephone Encounter (Signed)
I put in an order for compression stockings that should be routed to her pharmacy. Thanks!

## 2013-07-29 NOTE — Discharge Instructions (Signed)
Tests were normal. Followup your primary care Dr. Loman Brooklyn leg.

## 2013-07-29 NOTE — ED Notes (Signed)
Pt reports headache,back pain, bilateral leg swelling since last Wednesday. Pt also reports dark urine since last Thursday. Pt reports seen pcp and was diagnosed with kidney infection. Pt reports currently taking po abx. Pt reports continued malaise, dark urine.

## 2013-07-29 NOTE — Telephone Encounter (Signed)
Arline Asp PT at [facility] told pt to contact his PCP to get RX for compression stockings Once orders are faxed to her, she will contact pt to schedule fitting

## 2013-07-29 NOTE — ED Notes (Signed)
Lab at bedside to draw blood.

## 2013-07-29 NOTE — ED Notes (Signed)
Pt given urinal and asked to provide urine sample as soon as possible.

## 2013-08-01 ENCOUNTER — Telehealth (HOSPITAL_COMMUNITY): Payer: Self-pay | Admitting: Physical Therapy

## 2013-08-01 ENCOUNTER — Ambulatory Visit (HOSPITAL_COMMUNITY): Payer: Medicare Other | Admitting: Physical Therapy

## 2013-08-01 NOTE — Telephone Encounter (Signed)
Cancelled appointment due to fever and general malaise.  Going to MD tomorrow

## 2013-08-02 ENCOUNTER — Emergency Department (HOSPITAL_COMMUNITY): Payer: Medicare Other

## 2013-08-02 ENCOUNTER — Encounter (HOSPITAL_COMMUNITY): Payer: Self-pay | Admitting: Emergency Medicine

## 2013-08-02 ENCOUNTER — Observation Stay (HOSPITAL_COMMUNITY)
Admission: EM | Admit: 2013-08-02 | Discharge: 2013-08-05 | Disposition: A | Discharge status: Home / Self Care | Payer: Medicare Other | Attending: Internal Medicine | Admitting: Internal Medicine

## 2013-08-02 ENCOUNTER — Observation Stay (HOSPITAL_COMMUNITY): Payer: Medicare Other

## 2013-08-02 DIAGNOSIS — R110 Nausea: Secondary | ICD-10-CM | POA: Insufficient documentation

## 2013-08-02 DIAGNOSIS — Z872 Personal history of diseases of the skin and subcutaneous tissue: Secondary | ICD-10-CM | POA: Insufficient documentation

## 2013-08-02 DIAGNOSIS — G9589 Other specified diseases of spinal cord: Secondary | ICD-10-CM | POA: Diagnosis present

## 2013-08-02 DIAGNOSIS — Z792 Long term (current) use of antibiotics: Secondary | ICD-10-CM | POA: Insufficient documentation

## 2013-08-02 DIAGNOSIS — M7989 Other specified soft tissue disorders: Secondary | ICD-10-CM | POA: Insufficient documentation

## 2013-08-02 DIAGNOSIS — I10 Essential (primary) hypertension: Secondary | ICD-10-CM | POA: Diagnosis not present

## 2013-08-02 DIAGNOSIS — R600 Localized edema: Secondary | ICD-10-CM | POA: Diagnosis present

## 2013-08-02 DIAGNOSIS — I428 Other cardiomyopathies: Secondary | ICD-10-CM | POA: Diagnosis present

## 2013-08-02 DIAGNOSIS — R079 Chest pain, unspecified: Principal | ICD-10-CM | POA: Diagnosis present

## 2013-08-02 DIAGNOSIS — Z87828 Personal history of other (healed) physical injury and trauma: Secondary | ICD-10-CM | POA: Insufficient documentation

## 2013-08-02 DIAGNOSIS — Z79899 Other long term (current) drug therapy: Secondary | ICD-10-CM | POA: Insufficient documentation

## 2013-08-02 DIAGNOSIS — I890 Lymphedema, not elsewhere classified: Secondary | ICD-10-CM | POA: Diagnosis present

## 2013-08-02 DIAGNOSIS — R61 Generalized hyperhidrosis: Secondary | ICD-10-CM | POA: Diagnosis not present

## 2013-08-02 DIAGNOSIS — R0602 Shortness of breath: Secondary | ICD-10-CM | POA: Diagnosis not present

## 2013-08-02 DIAGNOSIS — J9801 Acute bronchospasm: Secondary | ICD-10-CM | POA: Diagnosis not present

## 2013-08-02 DIAGNOSIS — R609 Edema, unspecified: Secondary | ICD-10-CM | POA: Diagnosis not present

## 2013-08-02 DIAGNOSIS — R209 Unspecified disturbances of skin sensation: Secondary | ICD-10-CM | POA: Insufficient documentation

## 2013-08-02 DIAGNOSIS — D72829 Elevated white blood cell count, unspecified: Secondary | ICD-10-CM | POA: Diagnosis present

## 2013-08-02 DIAGNOSIS — R0789 Other chest pain: Secondary | ICD-10-CM | POA: Diagnosis not present

## 2013-08-02 DIAGNOSIS — Z87891 Personal history of nicotine dependence: Secondary | ICD-10-CM | POA: Diagnosis not present

## 2013-08-02 DIAGNOSIS — I2699 Other pulmonary embolism without acute cor pulmonale: Secondary | ICD-10-CM | POA: Diagnosis not present

## 2013-08-02 DIAGNOSIS — E669 Obesity, unspecified: Secondary | ICD-10-CM

## 2013-08-02 HISTORY — DX: Essential (primary) hypertension: I10

## 2013-08-02 HISTORY — DX: Cellulitis of unspecified part of limb: L03.119

## 2013-08-02 HISTORY — DX: Other specified diseases of spinal cord: G95.89

## 2013-08-02 LAB — BASIC METABOLIC PANEL
Anion gap: 10 (ref 5–15)
BUN: 12 mg/dL (ref 6–23)
CO2: 28 mEq/L (ref 19–32)
Calcium: 9.2 mg/dL (ref 8.4–10.5)
Chloride: 104 mEq/L (ref 96–112)
Creatinine, Ser: 1.06 mg/dL (ref 0.50–1.35)
GFR calc Af Amer: >90 mL/min (ref >90)
GFR calc non Af Amer: 84 mL/min — ABNORMAL LOW (ref >90)
Glucose, Bld: 97 mg/dL (ref 70–99)
Potassium: 4.1 mEq/L (ref 3.7–5.3)
Sodium: 142 mEq/L (ref 137–147)

## 2013-08-02 LAB — CBC
HCT: 40 % (ref 39.0–52.0)
Hemoglobin: 13.5 g/dL (ref 13.0–17.0)
MCH: 30.4 pg (ref 26.0–34.0)
MCHC: 33.8 g/dL (ref 30.0–36.0)
MCV: 90.1 fL (ref 78.0–100.0)
Platelets: 196 10*3/uL (ref 150–400)
RBC: 4.44 MIL/uL (ref 4.22–5.81)
RDW: 12.8 % (ref 11.5–15.5)
WBC: 12 10*3/uL — ABNORMAL HIGH (ref 4.0–10.5)

## 2013-08-02 LAB — TROPONIN I
Troponin I: <0.3 ng/mL (ref <0.30)
Troponin I: <0.3 ng/mL (ref <0.30)

## 2013-08-02 LAB — D-DIMER, QUANTITATIVE (NOT AT ARMC): D DIMER QUANT: 1.6 ug{FEU}/mL — AB (ref 0.00–0.48)

## 2013-08-02 MED ORDER — ACETAMINOPHEN 650 MG RE SUPP: 650.0000 mg | Freq: Four times a day (QID) | RECTAL | Status: DC | PRN

## 2013-08-02 MED ORDER — ALBUTEROL SULFATE (2.5 MG/3ML) 0.083% IN NEBU: 2.5000 mg | INHALATION_SOLUTION | Freq: Four times a day (QID) | RESPIRATORY_TRACT | Status: DC | PRN

## 2013-08-02 MED ORDER — ALBUTEROL SULFATE (2.5 MG/3ML) 0.083% IN NEBU
2.5000 mg | INHALATION_SOLUTION | Freq: Three times a day (TID) | RESPIRATORY_TRACT | Status: DC
  Administered 2013-08-02: 2.5 mg via RESPIRATORY_TRACT
  Filled 2013-08-02: qty 3

## 2013-08-02 MED ORDER — OXYCODONE HCL 5 MG PO TABS
5.0000 mg | ORAL_TABLET | ORAL | Status: DC | PRN
  Administered 2013-08-02: 5 mg via ORAL
  Filled 2013-08-02: qty 1

## 2013-08-02 MED ORDER — ONDANSETRON HCL 4 MG PO TABS: 4.0000 mg | ORAL_TABLET | Freq: Four times a day (QID) | ORAL | Status: DC | PRN

## 2013-08-02 MED ORDER — GUAIFENESIN-DM 100-10 MG/5ML PO SYRP: 5.0000 mL | ORAL_SOLUTION | ORAL | Status: DC | PRN

## 2013-08-02 MED ORDER — ONDANSETRON HCL 4 MG/2ML IJ SOLN: 4.0000 mg | Freq: Four times a day (QID) | INTRAMUSCULAR | Status: DC | PRN

## 2013-08-02 MED ORDER — MORPHINE SULFATE 2 MG/ML IJ SOLN: 2.0000 mg | INTRAMUSCULAR | Status: DC | PRN

## 2013-08-02 MED ORDER — ASPIRIN EC 81 MG PO TBEC
81.0000 mg | DELAYED_RELEASE_TABLET | Freq: Every day | ORAL | Status: DC
  Administered 2013-08-02 – 2013-08-05 (×4): 81 mg via ORAL
  Filled 2013-08-02 (×4): qty 1

## 2013-08-02 MED ORDER — FAMOTIDINE 20 MG PO TABS
20.0000 mg | ORAL_TABLET | Freq: Every day | ORAL | Status: DC
  Administered 2013-08-02 – 2013-08-05 (×4): 20 mg via ORAL
  Filled 2013-08-02 (×4): qty 1

## 2013-08-02 MED ORDER — NITROGLYCERIN 2 % TD OINT
0.5000 [in_us] | TOPICAL_OINTMENT | Freq: Four times a day (QID) | TRANSDERMAL | Status: AC
  Administered 2013-08-02 – 2013-08-03 (×2): 0.5 [in_us] via TOPICAL
  Filled 2013-08-02 (×3): qty 1

## 2013-08-02 MED ORDER — ALBUTEROL SULFATE (2.5 MG/3ML) 0.083% IN NEBU
2.5000 mg | INHALATION_SOLUTION | Freq: Two times a day (BID) | RESPIRATORY_TRACT | Status: DC
Start: 2013-08-03 — End: 2013-08-05
  Administered 2013-08-03 – 2013-08-05 (×5): 2.5 mg via RESPIRATORY_TRACT
  Filled 2013-08-02 (×5): qty 3

## 2013-08-02 MED ORDER — HEPARIN SODIUM (PORCINE) 5000 UNIT/ML IJ SOLN
5000.0000 [IU] | Freq: Three times a day (TID) | INTRAMUSCULAR | Status: DC
  Administered 2013-08-02 (×2): 5000 [IU] via SUBCUTANEOUS
  Filled 2013-08-02 (×2): qty 1

## 2013-08-02 MED ORDER — DOCUSATE SODIUM 100 MG PO CAPS
100.0000 mg | ORAL_CAPSULE | Freq: Two times a day (BID) | ORAL | Status: DC
  Administered 2013-08-02 – 2013-08-05 (×4): 100 mg via ORAL
  Filled 2013-08-02 (×6): qty 1

## 2013-08-02 MED ORDER — SODIUM CHLORIDE 0.9 % IJ SOLN
3.0000 mL | Freq: Two times a day (BID) | INTRAMUSCULAR | Status: DC
  Administered 2013-08-02 – 2013-08-04 (×3): 3 mL via INTRAVENOUS
  Administered 2013-08-04: 10 mL via INTRAVENOUS

## 2013-08-02 MED ORDER — ALUM & MAG HYDROXIDE-SIMETH 200-200-20 MG/5ML PO SUSP: 30.0000 mL | Freq: Four times a day (QID) | ORAL | Status: DC | PRN

## 2013-08-02 MED ORDER — IOHEXOL 350 MG/ML SOLN
150.0000 mL | Freq: Once | INTRAVENOUS | Status: AC | PRN
  Administered 2013-08-02: 150 mL via INTRAVENOUS

## 2013-08-02 MED ORDER — FUROSEMIDE 20 MG PO TABS
20.0000 mg | ORAL_TABLET | Freq: Every day | ORAL | Status: DC
  Administered 2013-08-03 – 2013-08-05 (×3): 20 mg via ORAL
  Filled 2013-08-02 (×3): qty 1

## 2013-08-02 MED ORDER — ACETAMINOPHEN 325 MG PO TABS: 650.0000 mg | ORAL_TABLET | Freq: Four times a day (QID) | ORAL | Status: DC | PRN
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
## 2013-08-02 NOTE — ED Notes (Addendum)
Per EMS, pt from home and reported chest pain this am around 11am. En route pt received 4mg  of zofran, 324mg  of aspirin and nitro x2. Pt pain now 3/10. Pt reports at time of cp onset, "broke out in a sweat and became hot, numbness on left arm and right hand." Pt just finished abx prescription for "kidney infection." pt reports left sided chest pain with intermittent sharp pain. nad noted. No dyspnea noted in triage.
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
## 2013-08-03 ENCOUNTER — Ambulatory Visit (HOSPITAL_COMMUNITY): Payer: Medicare Other | Admitting: Physical Therapy

## 2013-08-03 ENCOUNTER — Encounter (HOSPITAL_COMMUNITY): Payer: Self-pay

## 2013-08-03 ENCOUNTER — Observation Stay (HOSPITAL_COMMUNITY): Payer: Medicare Other

## 2013-08-03 DIAGNOSIS — R61 Generalized hyperhidrosis: Secondary | ICD-10-CM | POA: Diagnosis not present

## 2013-08-03 DIAGNOSIS — D72829 Elevated white blood cell count, unspecified: Secondary | ICD-10-CM

## 2013-08-03 DIAGNOSIS — I517 Cardiomegaly: Secondary | ICD-10-CM | POA: Diagnosis not present

## 2013-08-03 DIAGNOSIS — R0602 Shortness of breath: Secondary | ICD-10-CM | POA: Diagnosis not present

## 2013-08-03 DIAGNOSIS — I890 Lymphedema, not elsewhere classified: Secondary | ICD-10-CM | POA: Diagnosis not present

## 2013-08-03 DIAGNOSIS — E669 Obesity, unspecified: Secondary | ICD-10-CM

## 2013-08-03 DIAGNOSIS — R0789 Other chest pain: Secondary | ICD-10-CM | POA: Diagnosis present

## 2013-08-03 DIAGNOSIS — Z8669 Personal history of other diseases of the nervous system and sense organs: Secondary | ICD-10-CM | POA: Diagnosis not present

## 2013-08-03 DIAGNOSIS — J9801 Acute bronchospasm: Secondary | ICD-10-CM | POA: Diagnosis not present

## 2013-08-03 DIAGNOSIS — R609 Edema, unspecified: Secondary | ICD-10-CM | POA: Diagnosis not present

## 2013-08-03 DIAGNOSIS — R209 Unspecified disturbances of skin sensation: Secondary | ICD-10-CM | POA: Diagnosis not present

## 2013-08-03 DIAGNOSIS — R079 Chest pain, unspecified: Secondary | ICD-10-CM | POA: Diagnosis not present

## 2013-08-03 LAB — COMPREHENSIVE METABOLIC PANEL
ALK PHOS: 47 U/L (ref 39–117)
ALT: 15 U/L (ref 0–53)
AST: 26 U/L (ref 0–37)
Albumin: 3.3 g/dL — ABNORMAL LOW (ref 3.5–5.2)
Anion gap: 9 (ref 5–15)
BILIRUBIN TOTAL: 0.5 mg/dL (ref 0.3–1.2)
BUN: 11 mg/dL (ref 6–23)
CO2: 27 mEq/L (ref 19–32)
CREATININE: 1.04 mg/dL (ref 0.50–1.35)
Calcium: 9 mg/dL (ref 8.4–10.5)
Chloride: 102 mEq/L (ref 96–112)
GFR calc Af Amer: >90 mL/min (ref >90)
GFR calc non Af Amer: 86 mL/min — ABNORMAL LOW (ref >90)
Glucose, Bld: 110 mg/dL — ABNORMAL HIGH (ref 70–99)
POTASSIUM: 4.3 meq/L (ref 3.7–5.3)
Sodium: 138 mEq/L (ref 137–147)
Total Protein: 7.1 g/dL (ref 6.0–8.3)

## 2013-08-03 LAB — CBC
HEMATOCRIT: 39.5 % (ref 39.0–52.0)
Hemoglobin: 13.1 g/dL (ref 13.0–17.0)
MCH: 30.2 pg (ref 26.0–34.0)
MCHC: 33.2 g/dL (ref 30.0–36.0)
MCV: 91 fL (ref 78.0–100.0)
Platelets: 169 10*3/uL (ref 150–400)
RBC: 4.34 MIL/uL (ref 4.22–5.81)
RDW: 13 % (ref 11.5–15.5)
WBC: 11.3 10*3/uL — AB (ref 4.0–10.5)

## 2013-08-03 LAB — APTT: aPTT: 27 seconds (ref 24–37)

## 2013-08-03 LAB — LIPID PANEL
Cholesterol: 162 mg/dL (ref 0–200)
HDL: 33 mg/dL — ABNORMAL LOW (ref >39)
LDL Cholesterol: 100 mg/dL — ABNORMAL HIGH (ref 0–99)
Total CHOL/HDL Ratio: 4.9 RATIO
Triglycerides: 145 mg/dL (ref <150)
VLDL: 29 mg/dL (ref 0–40)

## 2013-08-03 LAB — TROPONIN I: Troponin I: <0.3 ng/mL (ref <0.30)

## 2013-08-03 LAB — TSH: TSH: 1.75 u[IU]/mL (ref 0.350–4.500)

## 2013-08-03 MED ORDER — ENOXAPARIN SODIUM 150 MG/ML ~~LOC~~ SOLN
1.0000 mg/kg | Freq: Once | SUBCUTANEOUS | Status: AC
  Administered 2013-08-03: 170 mg via SUBCUTANEOUS
  Filled 2013-08-03: qty 2

## 2013-08-03 MED ORDER — TECHNETIUM TC 99M SESTAMIBI GENERIC - CARDIOLITE
25.0000 | Freq: Once | INTRAVENOUS | Status: AC | PRN
  Administered 2013-08-03: 25 via INTRAVENOUS

## 2013-08-03 MED ORDER — ENOXAPARIN SODIUM 150 MG/ML ~~LOC~~ SOLN: 160.0000 mg | Freq: Two times a day (BID) | SUBCUTANEOUS | Status: DC

## 2013-08-03 MED ORDER — ENOXAPARIN SODIUM 150 MG/ML ~~LOC~~ SOLN
SUBCUTANEOUS | Status: AC
  Filled 2013-08-03: qty 2

## 2013-08-03 MED ORDER — REGADENOSON 0.4 MG/5ML IV SOLN
0.4000 mg | Freq: Once | INTRAVENOUS | Status: AC
  Administered 2013-08-04: 0.4 mg via INTRAVENOUS

## 2013-08-03 MED ORDER — ENOXAPARIN SODIUM 80 MG/0.8ML ~~LOC~~ SOLN
80.0000 mg | Freq: Two times a day (BID) | SUBCUTANEOUS | Status: DC
  Administered 2013-08-03 – 2013-08-05 (×4): 80 mg via SUBCUTANEOUS
  Filled 2013-08-03 (×4): qty 0.8
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
## 2013-08-03 NOTE — Progress Notes (Signed)
Utilization Review Completed.Kristalyn Bergstresser T7/29/2015  

## 2013-08-03 NOTE — Progress Notes (Signed)
*  PRELIMINARY RESULTS* Echocardiogram 2D Echocardiogram has been performed.  Jordan Jensen 08/03/2013, 12:08 PM

## 2013-08-04 ENCOUNTER — Observation Stay (HOSPITAL_COMMUNITY): Payer: Medicare Other

## 2013-08-04 ENCOUNTER — Encounter (HOSPITAL_COMMUNITY): Payer: Self-pay

## 2013-08-04 DIAGNOSIS — I428 Other cardiomyopathies: Secondary | ICD-10-CM | POA: Diagnosis not present

## 2013-08-04 DIAGNOSIS — I890 Lymphedema, not elsewhere classified: Secondary | ICD-10-CM | POA: Diagnosis not present

## 2013-08-04 DIAGNOSIS — E669 Obesity, unspecified: Secondary | ICD-10-CM | POA: Diagnosis not present

## 2013-08-04 DIAGNOSIS — R079 Chest pain, unspecified: Secondary | ICD-10-CM | POA: Diagnosis not present

## 2013-08-04 DIAGNOSIS — R609 Edema, unspecified: Secondary | ICD-10-CM | POA: Diagnosis not present

## 2013-08-04 DIAGNOSIS — R0789 Other chest pain: Secondary | ICD-10-CM | POA: Diagnosis not present

## 2013-08-04 DIAGNOSIS — J9801 Acute bronchospasm: Secondary | ICD-10-CM | POA: Diagnosis not present

## 2013-08-04 MED ORDER — RAMIPRIL 1.25 MG PO CAPS
1.2500 mg | ORAL_CAPSULE | Freq: Every day | ORAL | Status: DC
  Administered 2013-08-04 – 2013-08-05 (×2): 1.25 mg via ORAL
  Filled 2013-08-04 (×5): qty 1

## 2013-08-04 MED ORDER — DIGOXIN 125 MCG PO TABS
0.1250 mg | ORAL_TABLET | Freq: Every day | ORAL | Status: DC
  Administered 2013-08-04 – 2013-08-05 (×2): 0.125 mg via ORAL
  Filled 2013-08-04 (×2): qty 1

## 2013-08-04 MED ORDER — SODIUM CHLORIDE 0.9 % IJ SOLN
INTRAMUSCULAR | Status: AC
  Administered 2013-08-04: 10 mL via INTRAVENOUS
  Filled 2013-08-04: qty 10

## 2013-08-04 MED ORDER — REGADENOSON 0.4 MG/5ML IV SOLN
INTRAVENOUS | Status: AC
  Administered 2013-08-04: 0.4 mg via INTRAVENOUS
  Filled 2013-08-04: qty 5

## 2013-08-04 MED ORDER — TECHNETIUM TC 99M SESTAMIBI GENERIC - CARDIOLITE
30.0000 | Freq: Once | INTRAVENOUS | Status: AC | PRN
  Administered 2013-08-04: 32 via INTRAVENOUS

## 2013-08-04 NOTE — Progress Notes (Signed)
    Two-day protocol cardiolite as follows:  IMPRESSION:  Intermediate risk abnormal Lexiscan Cardiolite as outlined. There  were no diagnostic ST segment changes to indicate ischemia.  Perfusion imaging is most suggestive of soft tissue attenuation  affecting the anterior and inferolateral walls, scar cannot be  unequivocally excluded. No large ischemic territories were noted  however. LVEF is calculated at 35% with diffuse hypokinesis and  upper normal chamber volume. Could be consistent with a nonischemic  cardiomyopathy.  Evidence of possible nonischemic cardiomyopathy, Negative enzymes for ACS. Would start digoxin and low dose ACE-I if blood pressure tolerates. Hold off beta-blocker for now with recent bronchspasm (might by able to try bisoprolol eventually). Have him ambulate to ensure symptom stability. If he does well, possible D/C tomorrow - make followup with Family Practice Resident PCP and also with our practice. If he has further chest pain, then keep and we can consider heart catheterization. Discussed with Dr. Kerry Hough.    Jonelle Sidle, M.D., F.A.C.C.

## 2013-08-04 NOTE — Progress Notes (Signed)
PHYSICAL THERAPY  Received order and chart reviewed.  Screened patient, with no deficits from baseline noted with bed mobility skills, transfers, or ambulation skills.   Pt is currently being treated in [facility] for lymphadema of LE and has appointment schedule 08/05/13 at 2:30; if pt is not discharged from hospital by that time, treatment will be completed on the floor by PT.  Pt to be d/c from acute PT services after lymphadema treatments completed, as pt is at baseline level of function for mobility skills.     Kellie Shropshire, PT, DPT 08/04/13 16:05

## 2013-08-04 NOTE — Progress Notes (Signed)
Stress Lab Nurses Notes - Jeani Hawking  TARELLE YANES 08/04/2013 Reason for doing test: Chest Pain Type of test: Lexiscan Cardiolite / 2 day study / Inpatient Rm 319 Nurse performing test: Parke Poisson, RN Nuclear Medicine Tech: Lyndel Pleasure Echo Tech: Not Applicable MD performing test: S. McDowellK.Lyman Bishop NP Family MD: NPCP Test explained and consent signed: Yes.   IV started: Saline lock flushed, No redness or edema and Saline lock from floor Symptoms: Flushed,dizziness & pressure in stomach Treatment/Intervention: None Reason test stopped: protocol completed After recovery IV was: No redness or edema and Saline Lock flushed Patient to return to Nuc. Med at : 13:15 Patient discharged: Transported back to room 319 via wc Patient's Condition upon discharge was: stable Comments: During test BP 97/68 & HR 112.  Recovery BP 97/71 & HR 93.  Symptoms resolved in recovery. Erskine Speed T

## 2013-08-04 NOTE — Progress Notes (Signed)
Consulting cardiologist: Nona Dell MD  Subjective:    No complaints of chest pain. Some exertional dyspnea.   Objective:   Temp:  [97.4 F (36.3 C)-98.4 F (36.9 C)] 97.7 F (36.5 C) (07/30 0443) Pulse Rate:  [72-93] 93 (07/30 0443) Resp:  [20] 20 (07/30 0443) BP: (100-121)/(61-78) 110/64 mmHg (07/30 0443) SpO2:  [91 %-96 %] 94 % (07/30 0748) Weight:  [364 lb 13.8 oz (165.5 kg)] 364 lb 13.8 oz (165.5 kg) (07/30 0500) Last BM Date: 08/03/13  Filed Weights   08/02/13 1226 08/02/13 1557 08/04/13 0500  Weight: 371 lb (168.284 kg) 371 lb 4.1 oz (168.4 kg) 364 lb 13.8 oz (165.5 kg)    Intake/Output Summary (Last 24 hours) at 08/04/13 1125 Last data filed at 08/04/13 0800  Gross per 24 hour  Intake    480 ml  Output   1800 ml  Net  -1320 ml    Telemetry: NSR  Exam:  General: No acute distress.  Lungs: Clear to auscultation, nonlabored.  Cardiac: RRR, no gallop or rub.   Extremities: Chronic appearing lymphedema.   Lab Results:  Basic Metabolic Panel:  Recent Labs Lab 07/29/13 1658 08/02/13 1245 08/03/13 0252  NA 140 142 138  K 4.2 4.1 4.3  CL 103 104 102  CO2 30 28 27   GLUCOSE 94 97 110*  BUN 9 12 11   CREATININE 1.16 1.06 1.04  CALCIUM 9.0 9.2 9.0    CBC:  Recent Labs Lab 07/29/13 1658 08/02/13 1245 08/03/13 0252  WBC 9.8 12.0* 11.3*  HGB 13.6 13.5 13.1  HCT 39.9 40.0 39.5  MCV 90.5 90.1 91.0  PLT 207 196 169    Cardiac Enzymes:  Recent Labs Lab 08/02/13 1657 08/02/13 2255 08/03/13 0533  TROPONINI <0.30 <0.30 <0.30    Echocardiogram 08/03/2013 Left ventricle: The cavity size was at the upper limits of normal. Wall thickness was increased in a pattern of mild LVH. Systolic function was mildly reduced. The estimated ejection fraction was in the range of 45% to 50%. Diffuse hypokinesis. Doppler parameters are consistent with abnormal left ventricular relaxation (grade 1 diastolic dysfunction). - Left atrium: The atrium  was mildly dilated. - Right ventricle: Poorly visualized. The cavity size was normal. - Right atrium: The atrium was dilated. Central venous pressure (est): 3 mm Hg. - Tricuspid valve: There was trivial regurgitation. - Pulmonary arteries: Systolic pressure could not be accurately estimated. - Pericardium, extracardiac: There was no pericardial effusion.  - Limited images overall. There is mild LVH with LVEF approximately 45-50%, difficult to assess in the setting of limited views and ventricular ectopy. Grade 1 diastolic dysfunction. Mild left atrial enlargement. Trivial tricuspid regurgitation, unable to assess PASP.   Medications:   Scheduled Medications: . albuterol  2.5 mg Nebulization BID  . aspirin EC  81 mg Oral Daily  . docusate sodium  100 mg Oral BID  . enoxaparin (LOVENOX) injection  80 mg Subcutaneous Q12H   And  . enoxaparin (LOVENOX) injection  80 mg Subcutaneous Q12H  . famotidine  20 mg Oral Daily  . furosemide  20 mg Oral Daily  . regadenoson  0.4 mg Intravenous Once  . sodium chloride  3 mL Intravenous Q12H    PRN Medications: acetaminophen, acetaminophen, albuterol, alum & mag hydroxide-simeth, guaiFENesin-dextromethorphan, morphine injection, ondansetron (ZOFRAN) IV, ondansetron, oxyCODONE   Assessment and Plan:   1. Chest Pain: No recurrence over her last 24 hours, negative cardiac enzymes. Day 2 of stress Myoview completed this a.m. Echocardiogram has  been completed with mild LVH noted with LVEF mildly depressed at 45-50%. However the images were limited overall. Will followup with final recommendations.  2. Hypertension: Currently well-controlled. He may benefit from sleep study in the setting of morbid obesity and dyspnea on exertion. This can be completed as an outpatient at discretion of primary care physician. Really only on Lasix 20 mg when necessary fluid retention. With evidence of mild LVH, although study is limited, he may benefit from ARB in the  future.   Bettey MareKathryn M. Lawrence NP  08/04/2013, 11:25 AM   Attending note:  Patient seen and examined. Modified above note by Ms. Lawrence NP. Patient has not had recurrent chest pain under observation, cardiac enzymes argue against ACS. Echocardiogram done yesterday in the setting of PVCs and limited images demonstrated an estimated LVEF of 45-50%. Lexiscan Myoview will be completed today,a 2 day protocol study due to patient size. We will make final recommendations later.  Jonelle SidleSamuel G. McDowell, M.D., F.A.C.C.
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
## 2013-08-05 ENCOUNTER — Ambulatory Visit (HOSPITAL_COMMUNITY): Payer: Medicare Other | Admitting: Physical Therapy

## 2013-08-05 ENCOUNTER — Telehealth (HOSPITAL_COMMUNITY): Payer: Self-pay

## 2013-08-05 DIAGNOSIS — I890 Lymphedema, not elsewhere classified: Secondary | ICD-10-CM | POA: Diagnosis not present

## 2013-08-05 DIAGNOSIS — R079 Chest pain, unspecified: Secondary | ICD-10-CM | POA: Diagnosis not present

## 2013-08-05 DIAGNOSIS — I428 Other cardiomyopathies: Secondary | ICD-10-CM | POA: Diagnosis not present

## 2013-08-05 DIAGNOSIS — R609 Edema, unspecified: Secondary | ICD-10-CM | POA: Diagnosis not present

## 2013-08-05 LAB — BASIC METABOLIC PANEL
ANION GAP: 14 (ref 5–15)
BUN: 13 mg/dL (ref 6–23)
CALCIUM: 9.3 mg/dL (ref 8.4–10.5)
CO2: 25 mEq/L (ref 19–32)
Chloride: 103 mEq/L (ref 96–112)
Creatinine, Ser: 1.11 mg/dL (ref 0.50–1.35)
GFR, EST NON AFRICAN AMERICAN: 79 mL/min — AB (ref >90)
Glucose, Bld: 99 mg/dL (ref 70–99)
Potassium: 4.2 mEq/L (ref 3.7–5.3)
SODIUM: 142 meq/L (ref 137–147)

## 2013-08-05 LAB — CBC
HCT: 43.1 % (ref 39.0–52.0)
Hemoglobin: 14.3 g/dL (ref 13.0–17.0)
MCH: 30 pg (ref 26.0–34.0)
MCHC: 33.2 g/dL (ref 30.0–36.0)
MCV: 90.5 fL (ref 78.0–100.0)
Platelets: 212 10*3/uL (ref 150–400)
RBC: 4.76 MIL/uL (ref 4.22–5.81)
RDW: 13 % (ref 11.5–15.5)
WBC: 10 10*3/uL (ref 4.0–10.5)

## 2013-08-05 MED ORDER — RAMIPRIL 1.25 MG PO CAPS: 1.2500 mg | ORAL_CAPSULE | Freq: Every day | ORAL | Status: DC

## 2013-08-05 MED ORDER — FAMOTIDINE 20 MG PO TABS: 20.0000 mg | ORAL_TABLET | Freq: Every day | ORAL | Status: DC

## 2013-08-05 MED ORDER — METOPROLOL SUCCINATE ER 25 MG PO TB24: 25.0000 mg | ORAL_TABLET | Freq: Every day | ORAL | Status: DC

## 2013-08-05 MED ORDER — DIGOXIN 125 MCG PO TABS: 0.1250 mg | ORAL_TABLET | Freq: Every day | ORAL | Status: DC

## 2013-08-05 MED ORDER — ASPIRIN 81 MG PO TBEC: 81.0000 mg | DELAYED_RELEASE_TABLET | Freq: Every day | ORAL | Status: DC

## 2013-08-05 MED ORDER — METOPROLOL SUCCINATE ER 25 MG PO TB24
25.0000 mg | ORAL_TABLET | Freq: Every day | ORAL | Status: DC
  Administered 2013-08-05: 25 mg via ORAL
  Filled 2013-08-05: qty 1

## 2013-08-05 NOTE — Progress Notes (Signed)
Consulting cardiologist: Dina RichBranch, Branson Kranz MD Primary Cardiologist: Nona DellMcDowell, Samuel MD  Subjective:   Feels good. No chest pain. Wants to go home.   Objective:   Temp:  [98 F (36.7 C)] 98 F (36.7 C) (07/31 0605) Pulse Rate:  [84-95] 84 (07/31 0605) Resp:  [20] 20 (07/31 0605) BP: (121-131)/(71-97) 121/71 mmHg (07/31 0605) SpO2:  [93 %-97 %] 94 % (07/31 0720) Weight:  [363 lb 12.1 oz (165 kg)] 363 lb 12.1 oz (165 kg) (07/31 0500) Last BM Date: 08/04/13  Filed Weights   08/02/13 1557 08/04/13 0500 08/05/13 0500  Weight: 371 lb 4.1 oz (168.4 kg) 364 lb 13.8 oz (165.5 kg) 363 lb 12.1 oz (165 kg)    Intake/Output Summary (Last 24 hours) at 08/05/13 1004 Last data filed at 08/05/13 0800  Gross per 24 hour  Intake    360 ml  Output   1150 ml  Net   -790 ml   Echocardiogram Left ventricle: The cavity size was at the upper limits of normal. Wall thickness was increased in a pattern of mild LVH. Systolic function was mildly reduced. The estimated ejection fraction was in the range of 45% to 50%. Diffuse hypokinesis. Doppler parameters are consistent with abnormal left ventricular relaxation (grade 1 diastolic dysfunction). - Left atrium: The atrium was mildly dilated. - Right ventricle: Poorly visualized. The cavity size was normal. - Right atrium: The atrium was dilated. Central venous pressure (est): 3 mm Hg. - Tricuspid valve: There was trivial regurgitation. - Pulmonary arteries: Systolic pressure could not be accurately estimated. - Pericardium, extracardiac: There was no pericardial effusion. - Limited images overall. There is mild LVH with LVEF approximately 45-50%, difficult to assess in the setting of limited views and ventricular ectopy. Grade 1 diastolic dysfunction. Mild left atrial enlargement. Trivial tricuspid regurgitation, unable to assess PASP.   Telemetry: NSR with PVC's.   Exam:  General: No acute distress.  HEENT: Conjunctiva and lids  normal, oropharynx clear.  Lungs: Clear to auscultation, nonlabored.  Cardiac: No elevated JVP or bruits. RRR, no gallop or rub.   Abdomen: Normoactive bowel sounds, nontender, nondistended.  Extremities: No pitting edema, distal pulses full.  Neuropsychiatric: Alert and oriented x3, affect appropriate.   Lab Results:  Basic Metabolic Panel:  Recent Labs Lab 08/02/13 1245 08/03/13 0252 08/05/13 0638  NA 142 138 142  K 4.1 4.3 4.2  CL 104 102 103  CO2 28 27 25   GLUCOSE 97 110* 99  BUN 12 11 13   CREATININE 1.06 1.04 1.11  CALCIUM 9.2 9.0 9.3    Liver Function Tests:  Recent Labs Lab 08/03/13 0252  AST 26  ALT 15  ALKPHOS 47  BILITOT 0.5  PROT 7.1  ALBUMIN 3.3*    CBC:  Recent Labs Lab 08/02/13 1245 08/03/13 0252 08/05/13 0638  WBC 12.0* 11.3* 10.0  HGB 13.5 13.1 14.3  HCT 40.0 39.5 43.1  MCV 90.1 91.0 90.5  PLT 196 169 212    Cardiac Enzymes:  Recent Labs Lab 08/02/13 1657 08/02/13 2255 08/03/13 0533  TROPONINI <0.30 <0.30 <0.30    Radiology: Nm Myocar Multi W/spect W/wall Motion / Ef  08/04/2013   CLINICAL DATA:  43 year old male with hypertension, previous cervical spinal injury, morbid obesity, and chronic lymphedema. He presents with chest discomfort and has ruled out for myocardial infarction. This study is requested to evaluate for the presence and extent of ischemia.  EXAM: MYOCARDIAL IMAGING WITH SPECT (REST AND PHARMACOLOGIC-STRESS - 2 DAY PROTOCOL)  GATED LEFT  VENTRICULAR WALL MOTION STUDY  LEFT VENTRICULAR EJECTION FRACTION  TECHNIQUE: Standard myocardial SPECT imaging was performed after resting intravenous injection of 10 mCi Tc-50m sestamibi. Subsequently, on a second day, intravenous infusion of Lexiscan was performed under the supervision of the Cardiology staff. At peak effect of the drug, 30 mCi Tc-25m sestamibi was injected intravenously and standard myocardial SPECT imaging was performed. Quantitative gated imaging was also  performed to evaluate left ventricular wall motion, and estimate left ventricular ejection fraction.  FINDINGS: Baseline tracing shows sinus rhythm at 88 beats per min. Lexiscan bolus was given in standard fashion. Heart rate increased from 85 beats per min up to 112 beats per min, and blood pressure increased from 93/72 up to 101/73. No chest pain was reported. There were no diagnostic ST segment abnormalities, and no arrhythmias were noted.  Analysis of the raw perfusion data shows significant chest wall attenuation despite use of a 2 day protocol.  Tomographic views were obtained using the short axis, vertical long axis, and horizontal long axis planes. There are moderate-sized defects in the anterior and inferolateral wall, both fixed, and suggestive of soft tissue attenuation. There are no obvious large ischemic territories.  Gated imaging reveals an EDV of 142, a ESV of 93, LVEF of 35%, and TID ratio of 1.09. No focal wall motion abnormalities are identified.  IMPRESSION: Intermediate risk abnormal Lexiscan Cardiolite as outlined. There were no diagnostic ST segment changes to indicate ischemia. Perfusion imaging is most suggestive of soft tissue attenuation affecting the anterior and inferolateral walls, scar cannot be unequivocally excluded. No large ischemic territories were noted however. LVEF is calculated at 35% with diffuse hypokinesis and upper normal chamber volume. Could be consistent with a nonischemic cardiomyopathy.   Electronically Signed   By: Nona Dell M.D.   On: 08/04/2013 14:05     Medications:   Scheduled Medications: . albuterol  2.5 mg Nebulization BID  . aspirin EC  81 mg Oral Daily  . digoxin  0.125 mg Oral Daily  . docusate sodium  100 mg Oral BID  . enoxaparin (LOVENOX) injection  80 mg Subcutaneous Q12H   And  . enoxaparin (LOVENOX) injection  80 mg Subcutaneous Q12H  . famotidine  20 mg Oral Daily  . furosemide  20 mg Oral Daily  . ramipril  1.25 mg Oral Daily   . sodium chloride  3 mL Intravenous Q12H     PRN Medications:  acetaminophen, acetaminophen, albuterol, alum & mag hydroxide-simeth, guaiFENesin-dextromethorphan, morphine injection, ondansetron (ZOFRAN) IV, ondansetron, oxyCODONE   Assessment and Plan:    1. Chest Pain: No recurrent chest pain. Breathing better and is anxious to go home. Follow up appt is made for August 14th with cardiology.Marland Kitchen Rx for NTG is recommended for home use.   2. Systolic Dysfunction: Found both on echo and NM study. Echo demonstrates EF of 45%-50%, with Grade I diastolic dysfunction. Perfusion study demonstrated LVEF of 35% but soft tissue attenuation. Continue Digoxin and ramipril, with lasix 20 mg recently started. Tolerating well. Follow up BMET just prior to cardiology follow up.   Bettey Mare. Lawrence NP  08/05/2013, 10:04 AM  Patient seen and discussed with NP Lyman Bishop, agree with her documentation above. Mild LV systolic dysfunction by echo with LVEF 45-50%, no focal WMAs, and grade I diastolic dysfunction. Lexiscan MPI intermediate risk due to low LVEF of 35%, no clear ischemia, and likely attenuation as opposed to scar in the anterior and inferolateral walls. LVEF by echo is more accurate evaluation  of LVEF which is mildly decreased to low normal, and with lack of WMAs by echo supports MPI findings are artifact. Do not see strong indication for digoxin, will discontinue. Continue ACE-I, start low dose beta blocker Toprol XL 25 mg daily.   Will sign off of inpatient care. Can f/u with NP Lyman Bishop in 2 weeks.    Dominga Ferry MD

## 2013-08-05 NOTE — Progress Notes (Signed)
Discharge instructions given to patient. Patient verbalized understanding. Patient was given AVS handout. IV discontinued; patient tolerated well. Patient was in stable condition prior to discharge.

## 2013-08-05 NOTE — Discharge Instructions (Signed)

## 2013-08-05 NOTE — Discharge Summary (Signed)
Physician Discharge Summary  Jordan Jensen DSK:[id number] DOB: 1970-03-10 DOA: 08/02/2013  PCP: Yolande Jolly, MD  Admit date: 08/02/2013 Discharge date: 08/05/2013  Time spent: 40 minutes  Recommendations for Outpatient Follow-up:  1. Patient will follow up in cardiology clinic as an outpatient for further management of cardiomyopathy 2. We'll follow up with physical therapy for management of lymphedema. 3. Followup primary care physician in one to 2 weeks  Discharge Diagnoses:  Principal Problem:   Chest pain Active Problems:   Post traumatic myelopathy   Bilateral leg edema   Obesity   Lymphedema   Bronchospasm   History of spinal cord injury   Leukocytosis, unspecified   Atypical chest pain   Nonischemic cardiomyopathy   Discharge Condition: improved  Diet recommendation: low salt  Filed Weights   08/02/13 1557 08/04/13 0500 08/05/13 0500  Weight: 168.4 kg (371 lb 4.1 oz) 165.5 kg (364 lb 13.8 oz) 165 kg (363 lb 12.1 oz)    History of present illness and hospital course:  The patient presents to the hospital with complaints of sudden sharp chest pain. The patient is morbidly obese and has a history of chronic lymphedema in his lower extremities bilaterally. He was evaluated in the hospital were cardiac enzymes generally unremarkable. He was seen by cardiology and underwent echocardiogram which showed a mildly depressed ejection fraction of 45%. He also underwent a two day stress test which did not show any large areas of ischemia. Ejection fraction was noted to be lower on stress test, but this was felt to be related to attenuation. He was started on ACE inhibitor, beta blocker and continued on his outpatient dose of Lasix. He'll followup with his cardiologist as an outpatient regarding any further management. We'll also see physical therapy for continued lymphedema treatment as an outpatient. Patient has been cleared for discharge home.  Procedures: Echo:- Limited images  overall. There is mild LVH with LVEF approximately 45-50%, difficult to assess in the setting of limited views and ventricular ectopy. Grade 1 diastolic dysfunction. Mild left atrial enlargement. Trivial tricuspid regurgitation, unable to assess PASP.     Consultations:  Cardiology  Discharge Exam: Filed Vitals:   08/05/13 0605  BP: 121/71  Pulse: 84  Temp: 98 F (36.7 C)  Resp: 20    General: NAD Cardiovascular: S1, S2 RRR Respiratory: CTA B  Discharge Instructions You were cared for by a hospitalist during your hospital stay. If you have any questions about your discharge medications or the care you received while you were in the hospital after you are discharged, you can call the unit and asked to speak with the hospitalist on call if the hospitalist that took care of you is not available. Once you are discharged, your primary care physician will handle any further medical issues. Please note that NO REFILLS for any discharge medications will be authorized once you are discharged, as it is imperative that you return to your primary care physician (or establish a relationship with a primary care physician if you do not have one) for your aftercare needs so that they can reassess your need for medications and monitor your lab values.  Discharge Instructions   (HEART FAILURE PATIENTS) Call MD:  Anytime you have any of the following symptoms: 1) 3 pound weight gain in 24 hours or 5 pounds in 1 week 2) shortness of breath, with or without a dry hacking cough 3) swelling in the hands, feet or stomach 4) if you have to sleep on extra  pillows at night in order to breathe.    Complete by:  As directed      Diet - low sodium heart healthy    Complete by:  As directed      Increase activity slowly    Complete by:  As directed             Medication List    STOP taking these medications       ciprofloxacin 500 MG tablet  Commonly known as:  CIPRO     ibuprofen 600 MG tablet   Commonly known as:  ADVIL,MOTRIN      TAKE these medications       albuterol 108 (90 BASE) MCG/ACT inhaler  Commonly known as:  PROVENTIL HFA;VENTOLIN HFA  Inhale 2 puffs into the lungs every 6 (six) hours as needed for wheezing or shortness of breath.     aspirin 81 MG EC tablet  Take 1 tablet (81 mg total) by mouth daily.     famotidine 20 MG tablet  Commonly known as:  PEPCID  Take 1 tablet (20 mg total) by mouth daily.     furosemide 20 MG tablet  Commonly known as:  LASIX  Take 1 tablet (20 mg total) by mouth daily as needed.     metoprolol succinate 25 MG 24 hr tablet  Commonly known as:  TOPROL-XL  Take 1 tablet (25 mg total) by mouth daily.     ramipril 1.25 MG capsule  Commonly known as:  ALTACE  Take 1 capsule (1.25 mg total) by mouth daily.       No Known Allergies     Follow-up Information   Follow up with Joni Reining, NP On 08/19/2013. (1:30 pm)    Specialty:  Nurse Practitioner   Contact information:   [address] [place] [address] [phone]       Follow up with Melancon, Hillery Hunter, MD. Schedule an appointment as soon as possible for a visit in 2 weeks.   Specialty:  Family Medicine   Contact information:   [address] [place] [address] [phone]        The results of significant diagnostics from this hospitalization (including imaging, microbiology, ancillary and laboratory) are listed below for reference.    Significant Diagnostic Studies: Ct Angio Chest Pe W/cm &/or Wo Cm  08/02/2013   CLINICAL DATA:  Left and substernal chest pain, diaphoresis, shortness of breath, and nausea today.  EXAM: CT ANGIOGRAPHY CHEST WITH CONTRAST  TECHNIQUE: Multidetector CT imaging of the chest was performed using the standard protocol during bolus administration of intravenous contrast. Multiplanar CT image reconstructions and MIPs were obtained to evaluate the vascular anatomy.  CONTRAST:  OMNIPAQUE IOHEXOL 350 MG/ML SOLN   COMPARISON:  None.  FINDINGS: Technically limited study due to poor contrast bolus. Only the distal portions of the segmental pulmonary arteries in the lower lobes are well opacified. While no obvious central pulmonary emboli are identified, embolus cannot be excluded due to technical factors.  Normal caliber thoracic aorta. Mild cardiac enlargement. No significant lymphadenopathy in the chest. Esophagus is decompressed. Visualization of lungs is limited due to respiratory motion but no gross consolidation or airspace disease is suspected. Atelectasis in the lung bases. Airways appear patent. No pneumothorax. No pleural effusion. Visualized portions of the upper abdominal organs are grossly unremarkable. Postoperative changes in the lower cervical spine.  Review of the MIP images confirms the above findings.  IMPRESSION: Examination is indeterminate for pulmonary  embolus due to limitations of contrast bolus.   Electronically Signed   By: Burman Nieves M.D.   On: 08/02/2013 22:38   Nm Myocar Multi W/spect W/wall Motion / Ef  08/04/2013   CLINICAL DATA:  43 year old male with hypertension, previous cervical spinal injury, morbid obesity, and chronic lymphedema. He presents with chest discomfort and has ruled out for myocardial infarction. This study is requested to evaluate for the presence and extent of ischemia.  EXAM: MYOCARDIAL IMAGING WITH SPECT (REST AND PHARMACOLOGIC-STRESS - 2 DAY PROTOCOL)  GATED LEFT VENTRICULAR WALL MOTION STUDY  LEFT VENTRICULAR EJECTION FRACTION  TECHNIQUE: Standard myocardial SPECT imaging was performed after resting intravenous injection of 10 mCi Tc-35m sestamibi. Subsequently, on a second day, intravenous infusion of Lexiscan was performed under the supervision of the Cardiology staff. At peak effect of the drug, 30 mCi Tc-21m sestamibi was injected intravenously and standard myocardial SPECT imaging was performed. Quantitative gated imaging was also performed to evaluate left  ventricular wall motion, and estimate left ventricular ejection fraction.  FINDINGS: Baseline tracing shows sinus rhythm at 88 beats per min. Lexiscan bolus was given in standard fashion. Heart rate increased from 85 beats per min up to 112 beats per min, and blood pressure increased from 93/72 up to 101/73. No chest pain was reported. There were no diagnostic ST segment abnormalities, and no arrhythmias were noted.  Analysis of the raw perfusion data shows significant chest wall attenuation despite use of a 2 day protocol.  Tomographic views were obtained using the short axis, vertical long axis, and horizontal long axis planes. There are moderate-sized defects in the anterior and inferolateral wall, both fixed, and suggestive of soft tissue attenuation. There are no obvious large ischemic territories.  Gated imaging reveals an EDV of 142, a ESV of 93, LVEF of 35%, and TID ratio of 1.09. No focal wall motion abnormalities are identified.  IMPRESSION: Intermediate risk abnormal Lexiscan Cardiolite as outlined. There were no diagnostic ST segment changes to indicate ischemia. Perfusion imaging is most suggestive of soft tissue attenuation affecting the anterior and inferolateral walls, scar cannot be unequivocally excluded. No large ischemic territories were noted however. LVEF is calculated at 35% with diffuse hypokinesis and upper normal chamber volume. Could be consistent with a nonischemic cardiomyopathy.   Electronically Signed   By: Nona Dell M.D.   On: 08/04/2013 14:05   US Venous Img Lower Unilateral Left  07/29/2013   CLINICAL DATA:  Left lower extremity edema.  EXAM: LEFT LOWER EXTREMITY VENOUS DOPPLER ULTRASOUND  TECHNIQUE: Gray-scale sonography with graded compression, as well as color Doppler and duplex ultrasound, were performed to evaluate the deep venous system from the level of the common femoral vein through the popliteal and proximal calf veins. Spectral Doppler was utilized to evaluate  flow at rest and with distal augmentation maneuvers.  COMPARISON:  None.  FINDINGS: Normal compressibility, color Doppler flow and augmentation of the left common femoral vein, left femoral vein and left popliteal vein. The visualized left calf veins are patent. Limited evaluation of the calf veins.  IMPRESSION: Negative for left lower extremity deep vein thrombosis.   Electronically Signed   By: Richarda Overlie M.D.   On: 07/29/2013 17:20   Dg Chest Port 1 View  08/02/2013   CLINICAL DATA:  Left side chest pain for 2 hr.  EXAM: PORTABLE CHEST - 1 VIEW  COMPARISON:  Single view of the chest 06/10/2013. PA and lateral chest 04/23/2012.  FINDINGS: Lungs are clear. Heart size  is normal. No pneumothorax pleural effusion.  IMPRESSION: No acute disease.   Electronically Signed   By: Drusilla Kannerhomas  Dalessio M.D.   On: 08/02/2013 12:55    Microbiology: No results found for this or any previous visit (from the past 240 hour(s)).   Labs: Basic Metabolic Panel:  Recent Labs Lab 08/02/13 1245 08/03/13 0252 08/05/13 0638  NA 142 138 142  K 4.1 4.3 4.2  CL 104 102 103  CO2 28 27 25   GLUCOSE 97 110* 99  BUN 12 11 13   CREATININE 1.06 1.04 1.11  CALCIUM 9.2 9.0 9.3   Liver Function Tests:  Recent Labs Lab 08/03/13 0252  AST 26  ALT 15  ALKPHOS 47  BILITOT 0.5  PROT 7.1  ALBUMIN 3.3*   No results found for this basename: LIPASE, AMYLASE,  in the last 168 hours No results found for this basename: AMMONIA,  in the last 168 hours CBC:  Recent Labs Lab 08/02/13 1245 08/03/13 0252 08/05/13 0638  WBC 12.0* 11.3* 10.0  HGB 13.5 13.1 14.3  HCT 40.0 39.5 43.1  MCV 90.1 91.0 90.5  PLT 196 169 212   Cardiac Enzymes:  Recent Labs Lab 08/02/13 1245 08/02/13 1657 08/02/13 2255 08/03/13 0533  TROPONINI <0.30 <0.30 <0.30 <0.30   BNP: BNP (last 3 results) No results found for this basename: PROBNP,  in the last 8760 hours CBG: No results found for this basename: GLUCAP,  in the last 168  hours     Signed:  MEMON,JEHANZEB  [facility] 08/05/2013, 6:15 PM

## 2013-08-08 ENCOUNTER — Ambulatory Visit (HOSPITAL_COMMUNITY)
Admission: RE | Admit: 2013-08-08 | Payer: Medicare Other | Source: Ambulatory Visit | Attending: Internal Medicine | Admitting: Internal Medicine

## 2013-08-10 ENCOUNTER — Telehealth (HOSPITAL_COMMUNITY): Payer: Self-pay | Admitting: Physical Therapy

## 2013-08-10 ENCOUNTER — Ambulatory Visit (HOSPITAL_COMMUNITY)
Admission: RE | Admit: 2013-08-10 | Payer: Medicare Other | Source: Ambulatory Visit | Attending: Internal Medicine | Admitting: Internal Medicine

## 2013-08-15 ENCOUNTER — Encounter: Payer: Self-pay | Admitting: Family Medicine

## 2013-08-15 ENCOUNTER — Ambulatory Visit (INDEPENDENT_AMBULATORY_CARE_PROVIDER_SITE_OTHER): Payer: Medicare Other | Admitting: Family Medicine

## 2013-08-15 VITALS — BP 114/62 | HR 65 | Temp 99.0°F

## 2013-08-15 DIAGNOSIS — E669 Obesity, unspecified: Secondary | ICD-10-CM

## 2013-08-15 DIAGNOSIS — I5020 Unspecified systolic (congestive) heart failure: Secondary | ICD-10-CM

## 2013-08-15 DIAGNOSIS — I428 Other cardiomyopathies: Secondary | ICD-10-CM

## 2013-08-15 MED ORDER — FUROSEMIDE 20 MG PO TABS: 40.0000 mg | ORAL_TABLET | Freq: Every day | ORAL | Status: DC

## 2013-08-15 NOTE — Progress Notes (Signed)
Patient ID: Jordan Jensen, male   DOB: 1970-05-02, 43 y.o.   MRN: [id number]   [facility] [place] G. Aymar Whitfill, MD Phone: [phone]  Subjective:   # Pt. Here to establish care and for hospital follow up - Recent admission for bilateral lower extremity cellulitis and lymphedema. Currently asymptomatic with compression stockings and massage therapy - CHF diastolic complicated by significant lower extremity edema at this time. Taking Lasix per previous prescription.  - Morbid Obesity. This is the main topic of the visit, and the patient states that he has previously done very well with dieting and exercise having lost > 75lb. He says that he would like to be referred for nutritional counseling and weight management. He is highly motivated and would prefer this to metabolic surgery. He says that he is able to move around fairly well.   All relevant systems were reviewed and were negative unless otherwise noted in the HPI  Past Medical History Patient Active Problem List   Diagnosis Date Noted  . Nonischemic cardiomyopathy 08/04/2013  . Atypical chest pain 08/03/2013  . Chest pain 08/02/2013  . Bronchospasm 08/02/2013  . History of spinal cord injury 08/02/2013  . Leukocytosis, unspecified 08/02/2013  . Urinary tract infection, site not specified 07/21/2013  . Edema 06/28/2013  . Cellulitis 06/10/2013  . Cellulitis and abscess of leg 06/10/2013  . Cough 06/06/2013  . Low back pain 03/23/2013  . Left foot pain 07/24/2012  . Recurrent cellulitis of lower leg 05/05/2012  . Pain in joint, shoulder region 03/16/2012  . Lymphedema 11/07/2011  . Obesity 07/05/2011  . Snoring 07/05/2011  . Bilateral leg edema   . Post traumatic myelopathy    Reviewed problem list.  Medications- reviewed and updated Chief complaint-noted No additions to family history Social history- patient is a previous 1.5ppd smoker  Objective: BP 114/62  Pulse 65  Temp(Src) 99 F (37.2 C)  (Oral) Gen: NAD, alert, cooperative with exam HEENT: NCAT, EOMI, PERRL, TMs nml Neck: FROM, supple CV: RRR, good S1/S2, no murmur, cap refill <3 Resp: CTABL, no wheezes, non-labored Abd: SNTND, BS present, no guarding or organomegaly Ext: 4+ lower extremity edema, warm, normal tone, moves UE/LE spontaneously, No erythema or tenderness, compression stockings in place.  Neuro: Alert and oriented, No gross deficits Skin: no rashes no lesions  Assessment/Plan: See problem based a/p

## 2013-08-15 NOTE — Patient Instructions (Signed)
Exercise to Lose Weight Exercise and a healthy diet may help you lose weight. Your doctor may suggest specific exercises. EXERCISE IDEAS AND TIPS  Choose low-cost things you enjoy doing, such as walking, bicycling, or exercising to workout videos.  Take stairs instead of the elevator.  Walk during your lunch break.  Park your car further away from work or school.  Go to a gym or an exercise class.  Start with 5 to 10 minutes of exercise each day. Build up to 30 minutes of exercise 4 to 6 days a week.  Wear shoes with good support and comfortable clothes.  Stretch before and after working out.  Work out until you breathe harder and your heart beats faster.  Drink extra water when you exercise.  Do not do so much that you hurt yourself, feel dizzy, or get very short of breath. Exercises that burn about 150 calories:  Running 1  miles in 15 minutes.  Playing volleyball for 45 to 60 minutes.  Washing and waxing a car for 45 to 60 minutes.  Playing touch football for 45 minutes.  Walking 1  miles in 35 minutes.  Pushing a stroller 1  miles in 30 minutes.  Playing basketball for 30 minutes.  Raking leaves for 30 minutes.  Bicycling 5 miles in 30 minutes.  Walking 2 miles in 30 minutes.  Dancing for 30 minutes.  Shoveling snow for 15 minutes.  Swimming laps for 20 minutes.  Walking up stairs for 15 minutes.  Bicycling 4 miles in 15 minutes.  Gardening for 30 to 45 minutes.  Jumping rope for 15 minutes.  Washing windows or floors for 45 to 60 minutes. Document Released: 01/25/2010 Document Revised: 03/17/2011 Document Reviewed: 01/25/2010 [employer] Patient Information 2015 [employer]. This information is not intended to replace advice given to you by your health care provider. Make sure you discuss any questions you have with your health care provider. Calorie Counting for Weight Loss Calories are energy you get from the things you eat and drink. Your  body uses this energy to keep you going throughout the day. The number of calories you eat affects your weight. When you eat more calories than your body needs, your body stores the extra calories as fat. When you eat fewer calories than your body needs, your body burns fat to get the energy it needs. Calorie counting means keeping track of how many calories you eat and drink each day. If you make sure to eat fewer calories than your body needs, you should lose weight. In order for calorie counting to work, you will need to eat the number of calories that are right for you in a day to lose a healthy amount of weight per week. A healthy amount of weight to lose per week is usually 1-2 lb (0.5-0.9 kg). A dietitian can determine how many calories you need in a day and give you suggestions on how to reach your calorie goal.  WHAT IS MY MY PLAN? My goal is to have __________ calories per day.  If I have this many calories per day, I should lose around __________ pounds per week. WHAT DO I NEED TO KNOW ABOUT CALORIE COUNTING? In order to meet your daily calorie goal, you will need to:  Find out how many calories are in each food you would like to eat. Try to do this before you eat.  Decide how much of the food you can eat.  Write down what you ate and   how many calories it had. Doing this is called keeping a food log. WHERE DO I FIND CALORIE INFORMATION? The number of calories in a food can be found on a Nutrition Facts label. Note that all the information on a label is based on a specific serving of the food. If a food does not have a Nutrition Facts label, try to look up the calories online or ask your dietitian for help. HOW DO I DECIDE HOW MUCH TO EAT? To decide how much of the food you can eat, you will need to consider both the number of calories in one serving and the size of one serving. This information can be found on the Nutrition Facts label. If a food does not have a Nutrition Facts label, look  up the information online or ask your dietitian for help. Remember that calories are listed per serving. If you choose to have more than one serving of a food, you will have to multiply the calories per serving by the amount of servings you plan to eat. For example, the label on a package of bread might say that a serving size is 1 slice and that there are 90 calories in a serving. If you eat 1 slice, you will have eaten 90 calories. If you eat 2 slices, you will have eaten 180 calories. HOW DO I KEEP A FOOD LOG? After each meal, record the following information in your food log:  What you ate.  How much of it you ate.  How many calories it had.  Then, add up your calories. Keep your food log near you, such as in a small notebook in your pocket. Another option is to use a mobile app or website. Some programs will calculate calories for you and show you how many calories you have left each time you add an item to the log. WHAT ARE SOME CALORIE COUNTING TIPS?  Use your calories on foods and drinks that will fill you up and not leave you hungry. Some examples of this include foods like nuts and nut butters, vegetables, lean proteins, and high-fiber foods (more than 5 g fiber per serving).  Eat nutritious foods and avoid empty calories. Empty calories are calories you get from foods or beverages that do not have many nutrients, such as candy and soda. It is better to have a nutritious high-calorie food (such as an avocado) than a food with few nutrients (such as a bag of chips).  Know how many calories are in the foods you eat most often. This way, you do not have to look up how many calories they have each time you eat them.  Look out for foods that may seem like low-calorie foods but are really high-calorie foods, such as baked goods, soda, and fat-free candy.  Pay attention to calories in drinks. Drinks such as sodas, specialty coffee drinks, alcohol, and juices have a lot of calories yet do  not fill you up. Choose low-calorie drinks like water and diet drinks.  Focus your calorie counting efforts on higher calorie items. Logging the calories in a garden salad that contains only vegetables is less important than calculating the calories in a milk shake.  Find a way of tracking calories that works for you. Get creative. Most people who are successful find ways to keep track of how much they eat in a day, even if they do not count every calorie. WHAT ARE SOME PORTION CONTROL TIPS?  Know how many calories are in a   serving. This will help you know how many servings of a certain food you can have.  Use a measuring cup to measure serving sizes. This is helpful when you start out. With time, you will be able to estimate serving sizes for some foods.  Take some time to put servings of different foods on your favorite plates, bowls, and cups so you know what a serving looks like.  Try not to eat straight from a bag or box. Doing this can lead to overeating. Put the amount you would like to eat in a cup or on a plate to make sure you are eating the right portion.  Use smaller plates, glasses, and bowls to prevent overeating. This is a quick and easy way to practice portion control. If your plate is smaller, less food can fit on it.  Try not to multitask while eating, such as watching TV or using your computer. If it is time to eat, sit down at a table and enjoy your food. Doing this will help you to start recognizing when you are full. It will also make you more aware of what and how much you are eating. HOW CAN I CALORIE COUNT WHEN EATING OUT?  Ask for smaller portion sizes or child-sized portions.  Consider sharing an entree and sides instead of getting your own entree.  If you get your own entree, eat only half. Ask for a box at the beginning of your meal and put the rest of your entree in it so you are not tempted to eat it.  Look for the calories on the menu. If calories are listed,  choose the lower calorie options.  Choose dishes that include vegetables, fruits, whole grains, low-fat dairy products, and lean protein. Focusing on smart food choices from each of the 5 food groups can help you stay on track at restaurants.  Choose items that are boiled, broiled, grilled, or steamed.  Choose water, milk, unsweetened iced tea, or other drinks without added sugars. If you want an alcoholic beverage, choose a lower calorie option. For example, a regular margarita can have up to 700 calories and a glass of wine has around 150.  Stay away from items that are buttered, battered, fried, or served with cream sauce. Items labeled "crispy" are usually fried, unless stated otherwise.  Ask for dressings, sauces, and syrups on the side. These are usually very high in calories, so do not eat much of them.  Watch out for salads. Many people think salads are a healthy option, but this is often not the case. Many salads come with bacon, fried chicken, lots of cheese, fried chips, and dressing. All of these items have a lot of calories. If you want a salad, choose a garden salad and ask for grilled meats or steak. Ask for the dressing on the side, or ask for olive oil and vinegar or lemon to use as dressing.  Estimate how many servings of a food you are given. For example, a serving of cooked rice is  cup or about the size of half a tennis ball or one cupcake wrapper. Knowing serving sizes will help you be aware of how much food you are eating at restaurants. The list below tells you how big or small some common portion sizes are based on everyday objects.  1 oz--4 stacked dice.  3 oz--1 deck of cards.  1 tsp--1 dice.  1 Tbsp-- a Ping-Pong ball.  2 Tbsp--1 Ping-Pong ball.   cup--1 tennis ball   or 1 cupcake wrapper.  1 cup--1 baseball. Document Released: 12/23/2004 Document Revised: 05/09/2013 Document Reviewed: 10/28/2012 [facility] Patient Information 2015 [place], [place]. This  information is not intended to replace advice given to you by your health care provider. Make sure you discuss any questions you have with your health care provider. Heart Failure Heart failure is a condition in which the heart has trouble pumping blood. This means your heart does not pump blood efficiently for your body to work well. In some cases of heart failure, fluid may back up into your lungs or you may have swelling (edema) in your lower legs. Heart failure is usually a long-term (chronic) condition. It is important for you to take good care of yourself and follow your health care provider's treatment plan. CAUSES  Some health conditions can cause heart failure. Those health conditions include:  High blood pressure (hypertension). Hypertension causes the heart muscle to work harder than normal. When pressure in the blood vessels is high, the heart needs to pump (contract) with more force in order to circulate blood throughout the body. High blood pressure eventually causes the heart to become stiff and weak.  Coronary artery disease (CAD). CAD is the buildup of cholesterol and fat (plaque) in the arteries of the heart. The blockage in the arteries deprives the heart muscle of oxygen and blood. This can cause chest pain and may lead to a heart attack. High blood pressure can also contribute to CAD.  Heart attack (myocardial infarction). A heart attack occurs when one or more arteries in the heart become blocked. The loss of oxygen damages the muscle tissue of the heart. When this happens, part of the heart muscle dies. The injured tissue does not contract as well and weakens the heart's ability to pump blood.  Abnormal heart valves. When the heart valves do not open and close properly, it can cause heart failure. This makes the heart muscle pump harder to keep the blood flowing.  Heart muscle disease (cardiomyopathy or myocarditis). Heart muscle disease is damage to the heart muscle from a  variety of causes. These can include drug or alcohol abuse, infections, or unknown reasons. These can increase the risk of heart failure.  Lung disease. Lung disease makes the heart work harder because the lungs do not work properly. This can cause a strain on the heart, leading it to fail.  Diabetes. Diabetes increases the risk of heart failure. High blood sugar contributes to high fat (lipid) levels in the blood. Diabetes can also cause slow damage to tiny blood vessels that carry important nutrients to the heart muscle. When the heart does not get enough oxygen and food, it can cause the heart to become weak and stiff. This leads to a heart that does not contract efficiently.  Other conditions can contribute to heart failure. These include abnormal heart rhythms, thyroid problems, and low blood counts (anemia). Certain unhealthy behaviors can increase the risk of heart failure, including:  Being overweight.  Smoking or chewing tobacco.  Eating foods high in fat and cholesterol.  Abusing illicit drugs or alcohol.  Lacking physical activity. SYMPTOMS  Heart failure symptoms may vary and can be hard to detect. Symptoms may include:  Shortness of breath with activity, such as climbing stairs.  Persistent cough.  Swelling of the feet, ankles, legs, or abdomen.  Unexplained weight gain.  Difficulty breathing when lying flat (orthopnea).  Waking from sleep because of the need to sit up and get more air.  Rapid heartbeat.  Fatigue and loss of energy.  Feeling light-headed, dizzy, or close to fainting.  Loss of appetite.  Nausea.  Increased urination during the night (nocturia). DIAGNOSIS  A diagnosis of heart failure is based on your history, symptoms, physical examination, and diagnostic tests. Diagnostic tests for heart failure may include:  Echocardiography.  Electrocardiography.  Chest X-ray.  Blood tests.  Exercise stress test.  Cardiac  angiography.  Radionuclide scans. TREATMENT  Treatment is aimed at managing the symptoms of heart failure. Medicines, behavioral changes, or surgical intervention may be necessary to treat heart failure.  Medicines to help treat heart failure may include:  Angiotensin-converting enzyme (ACE) inhibitors. This type of medicine blocks the effects of a blood protein called angiotensin-converting enzyme. ACE inhibitors relax (dilate) the blood vessels and help lower blood pressure.  Angiotensin receptor blockers (ARBs). This type of medicine blocks the actions of a blood protein called angiotensin. Angiotensin receptor blockers dilate the blood vessels and help lower blood pressure.  Water pills (diuretics). Diuretics cause the kidneys to remove salt and water from the blood. The extra fluid is removed through urination. This loss of extra fluid lowers the volume of blood the heart pumps.  Beta blockers. These prevent the heart from beating too fast and improve heart muscle strength.  Digitalis. This increases the force of the heartbeat.  Healthy behavior changes include:  Obtaining and maintaining a healthy weight.  Stopping smoking or chewing tobacco.  Eating heart-healthy foods.  Limiting or avoiding alcohol.  Stopping illicit drug use.  Physical activity as directed by your health care provider.  Surgical treatment for heart failure may include:  A procedure to open blocked arteries, repair damaged heart valves, or remove damaged heart muscle tissue.  A pacemaker to improve heart muscle function and control certain abnormal heart rhythms.  An internal cardioverter defibrillator to treat certain serious abnormal heart rhythms.  A left ventricular assist device (LVAD) to assist the pumping ability of the heart. HOME CARE INSTRUCTIONS   Take medicines only as directed by your health care provider. Medicines are important in reducing the workload of your heart, slowing the  progression of heart failure, and improving your symptoms.  Do not stop taking your medicine unless directed by your health care provider.  Do not skip any dose of medicine.  Refill your prescriptions before you run out of medicine. Your medicines are needed every day.  Engage in moderate physical activity if directed by your health care provider. Moderate physical activity can benefit some people. The elderly and people with severe heart failure should consult with a health care provider for physical activity recommendations.  Eat heart-healthy foods. Food choices should be free of trans fat and low in saturated fat, cholesterol, and salt (sodium). Healthy choices include fresh or frozen fruits and vegetables, fish, lean meats, legumes, fat-free or low-fat dairy products, and whole grain or high fiber foods. Talk to a dietitian to learn more about heart-healthy foods.  Limit sodium if directed by your health care provider. Sodium restriction may reduce symptoms of heart failure in some people. Talk to a dietitian to learn more about heart-healthy seasonings.  Use healthy cooking methods. Healthy cooking methods include roasting, grilling, broiling, baking, poaching, steaming, or stir-frying. Talk to a dietitian to learn more about healthy cooking methods.  Limit fluids if directed by your health care provider. Fluid restriction may reduce symptoms of heart failure in some people.  Weigh yourself every day. Daily weights are important in the early recognition of excess  fluid. You should weigh yourself every morning after you urinate and before you eat breakfast. Wear the same amount of clothing each time you weigh yourself. Record your daily weight. Provide your health care provider with your weight record.  Monitor and record your blood pressure if directed by your health care provider.  Check your pulse if directed by your health care provider.  Lose weight if directed by your health care  provider. Weight loss may reduce symptoms of heart failure in some people.  Stop smoking or chewing tobacco. Nicotine makes your heart work harder by causing your blood vessels to constrict. Do not use nicotine gum or patches before talking to your health care provider.  Keep all follow-up visits as directed by your health care provider. This is important.  Limit alcohol intake to no more than 1 drink per day for nonpregnant women and 2 drinks per day for men. One drink equals 12 ounces of beer, 5 ounces of wine, or 1 ounces of hard liquor. Drinking more than that is harmful to your heart. Tell your health care provider if you drink alcohol several times a week. Talk with your health care provider about whether alcohol is safe for you. If your heart has already been damaged by alcohol or you have severe heart failure, drinking alcohol should be stopped completely.  Stop illicit drug use.  Stay up-to-date with immunizations. It is especially important to prevent respiratory infections through current pneumococcal and influenza immunizations.  Manage other health conditions such as hypertension, diabetes, thyroid disease, or abnormal heart rhythms as directed by your health care provider.  Learn to manage stress.  Plan rest periods when fatigued.  Learn strategies to manage high temperatures. If the weather is extremely hot:  Avoid vigorous physical activity.  Use air conditioning or fans or seek a cooler location.  Avoid caffeine and alcohol.  Wear loose-fitting, lightweight, and light-colored clothing.  Learn strategies to manage cold temperatures. If the weather is extremely cold:  Avoid vigorous physical activity.  Layer clothes.  Wear mittens or gloves, a hat, and a scarf when going outside.  Avoid alcohol.  Obtain ongoing education and support as needed.  Participate in or seek rehabilitation as needed to maintain or improve independence and quality of life. SEEK MEDICAL  CARE IF:   Your weight increases by 03 lb/1.4 kg in 1 day or 05 lb/2.3 kg in a week.  You have increasing shortness of breath that is unusual for you.  You are unable to participate in your usual physical activities.  You tire easily.  You cough more than normal, especially with physical activity.  You have any or more swelling in areas such as your hands, feet, ankles, or abdomen.  You are unable to sleep because it is hard to breathe.  You feel like your heart is beating fast (palpitations).  You become dizzy or light-headed upon standing up. SEEK IMMEDIATE MEDICAL CARE IF:   You have difficulty breathing.  There is a change in mental status such as decreased alertness or difficulty with concentration.  You have a pain or discomfort in your chest.  You have an episode of fainting (syncope). MAKE SURE YOU:   Understand these instructions.  Will watch your condition.  Will get help right away if you are not doing well or get worse. Document Released: 12/23/2004 Document Revised: 05/09/2013 Document Reviewed: 01/23/2012 [facility] Patient Information 2015 [place], [place]. This information is not intended to replace advice given to you by your health  care provider. Make sure you discuss any questions you have with your health care provider.  

## 2013-08-16 LAB — BASIC METABOLIC PANEL
BUN: 11 mg/dL (ref 6–23)
CALCIUM: 9.1 mg/dL (ref 8.4–10.5)
CHLORIDE: 104 meq/L (ref 96–112)
CO2: 26 meq/L (ref 19–32)
CREATININE: 1.09 mg/dL (ref 0.50–1.35)
GLUCOSE: 87 mg/dL (ref 70–99)
Potassium: 4.4 mEq/L (ref 3.5–5.3)
Sodium: 136 mEq/L (ref 135–145)

## 2013-08-17 ENCOUNTER — Ambulatory Visit (HOSPITAL_COMMUNITY): Payer: Medicare Other | Admitting: Physical Therapy

## 2013-08-19 ENCOUNTER — Ambulatory Visit (INDEPENDENT_AMBULATORY_CARE_PROVIDER_SITE_OTHER): Payer: Medicare Other | Admitting: Adult Health

## 2013-08-19 ENCOUNTER — Encounter: Payer: Self-pay | Admitting: Adult Health

## 2013-08-19 ENCOUNTER — Ambulatory Visit (HOSPITAL_COMMUNITY): Payer: Medicare Other | Admitting: Physical Therapy

## 2013-08-19 VITALS — BP 116/76 | HR 77 | Ht 71.0 in | Wt 365.0 lb

## 2013-08-19 DIAGNOSIS — I890 Lymphedema, not elsewhere classified: Secondary | ICD-10-CM | POA: Diagnosis not present

## 2013-08-19 DIAGNOSIS — I428 Other cardiomyopathies: Secondary | ICD-10-CM

## 2013-08-19 NOTE — Progress Notes (Deleted)
Name: Jordan Jensen    DOB: Sep 27, 1970  Age: 43 y.o.  MR#: [id number]       PCP:  Yolande Jolly, MD      Insurance: Payor: MEDICARE / Plan: MEDICARE PART A AND B / Product Type: *No Product type* /   CC:    Chief Complaint  Patient presents with  . Chest Pain  . Congestive Heart Failure    VS Filed Vitals:   08/19/13 1321  BP: 116/76  Pulse: 77  Height: 5\' 11"  (1.803 m)  Weight: 365 lb (165.563 kg)    Weights Current Weight  08/19/13 365 lb (165.563 kg)  08/05/13 363 lb 12.1 oz (165 kg)  07/29/13 371 lb (168.284 kg)    Blood Pressure  BP Readings from Last 3 Encounters:  08/19/13 116/76  08/15/13 114/62  08/05/13 121/71     Admit date:  (Not on file) Last encounter with RMR:  Visit date not found   Allergy Review of patient's allergies indicates no known allergies.  Current Outpatient Prescriptions  Medication Sig Dispense Refill  . albuterol (PROVENTIL HFA;VENTOLIN HFA) 108 (90 BASE) MCG/ACT inhaler Inhale 2 puffs into the lungs every 6 (six) hours as needed for wheezing or shortness of breath.      Marland Kitchen aspirin EC 81 MG EC tablet Take 1 tablet (81 mg total) by mouth daily.  30 tablet  1  . famotidine (PEPCID) 20 MG tablet Take 1 tablet (20 mg total) by mouth daily.  30 tablet  1  . furosemide (LASIX) 20 MG tablet Take 2 tablets (40 mg total) by mouth daily.  30 tablet  6  . metoprolol succinate (TOPROL-XL) 25 MG 24 hr tablet Take 1 tablet (25 mg total) by mouth daily.  30 tablet  1   No current facility-administered medications for this visit.    Discontinued Meds:    Medications Discontinued During This Encounter  Medication Reason  . ramipril (ALTACE) 1.25 MG capsule Error    Patient Active Problem List   Diagnosis Date Noted  . Nonischemic cardiomyopathy 08/04/2013  . Atypical chest pain 08/03/2013  . Chest pain 08/02/2013  . Bronchospasm 08/02/2013  . History of spinal cord injury 08/02/2013  . Leukocytosis, unspecified 08/02/2013  . Urinary tract  infection, site not specified 07/21/2013  . Edema 06/28/2013  . Cellulitis 06/10/2013  . Cellulitis and abscess of leg 06/10/2013  . Cough 06/06/2013  . Low back pain 03/23/2013  . Left foot pain 07/24/2012  . Recurrent cellulitis of lower leg 05/05/2012  . Pain in joint, shoulder region 03/16/2012  . Lymphedema 11/07/2011  . Obesity 07/05/2011  . Snoring 07/05/2011  . Bilateral leg edema   . Post traumatic myelopathy     LABS    Component Value Date/Time   NA 136 08/15/2013 1516   NA 142 08/05/2013 0638   NA 138 08/03/2013 0252   K 4.4 08/15/2013 1516   K 4.2 08/05/2013 0638   K 4.3 08/03/2013 0252   CL 104 08/15/2013 1516   CL 103 08/05/2013 0638   CL 102 08/03/2013 0252   CO2 26 08/15/2013 1516   CO2 25 08/05/2013 0638   CO2 27 08/03/2013 0252   GLUCOSE 87 08/15/2013 1516   GLUCOSE 99 08/05/2013 0638   GLUCOSE 110* 08/03/2013 0252   BUN 11 08/15/2013 1516   BUN 13 08/05/2013 0638   BUN 11 08/03/2013 0252   CREATININE 1.09 08/15/2013 1516   CREATININE 1.11 08/05/2013 0638   CREATININE 1.04  08/03/2013 0252   CREATININE 1.06 08/02/2013 1245   CREATININE 1.20 07/21/2013 1236   CREATININE 0.99 06/06/2013 1255   CALCIUM 9.1 08/15/2013 1516   CALCIUM 9.3 08/05/2013 0638   CALCIUM 9.0 08/03/2013 0252   GFRNONAA 79* 08/05/2013 0638   GFRNONAA 86* 08/03/2013 0252   GFRNONAA 84* 08/02/2013 1245   GFRAA >90 08/05/2013 0638   GFRAA >90 08/03/2013 0252   GFRAA >90 08/02/2013 1245   CMP     Component Value Date/Time   NA 136 08/15/2013 1516   K 4.4 08/15/2013 1516   CL 104 08/15/2013 1516   CO2 26 08/15/2013 1516   GLUCOSE 87 08/15/2013 1516   BUN 11 08/15/2013 1516   CREATININE 1.09 08/15/2013 1516   CREATININE 1.11 08/05/2013 0638   CALCIUM 9.1 08/15/2013 1516   PROT 7.1 08/03/2013 0252   ALBUMIN 3.3* 08/03/2013 0252   AST 26 08/03/2013 0252   ALT 15 08/03/2013 0252   ALKPHOS 47 08/03/2013 0252   BILITOT 0.5 08/03/2013 0252   GFRNONAA 79* 08/05/2013 0638   GFRAA >90 08/05/2013 0638       Component  Value Date/Time   WBC 10.0 08/05/2013 0638   WBC 11.3* 08/03/2013 0252   WBC 12.0* 08/02/2013 1245   HGB 14.3 08/05/2013 0638   HGB 13.1 08/03/2013 0252   HGB 13.5 08/02/2013 1245   HCT 43.1 08/05/2013 0638   HCT 39.5 08/03/2013 0252   HCT 40.0 08/02/2013 1245   MCV 90.5 08/05/2013 0638   MCV 91.0 08/03/2013 0252   MCV 90.1 08/02/2013 1245    Lipid Panel     Component Value Date/Time   CHOL 162 08/03/2013 0252   TRIG 145 08/03/2013 0252   HDL 33* 08/03/2013 0252   CHOLHDL 4.9 08/03/2013 0252   VLDL 29 08/03/2013 0252   LDLCALC 100* 08/03/2013 0252    ABG No results found for this basename: phart, pco2, pco2art, po2, po2art, hco3, tco2, acidbasedef, o2sat     Lab Results  Component Value Date   TSH 1.750 08/02/2013   BNP (last 3 results) No results found for this basename: PROBNP,  in the last 8760 hours Cardiac Panel (last 3 results) No results found for this basename: CKTOTAL, CKMB, TROPONINI, RELINDX,  in the last 72 hours  Iron/TIBC/Ferritin/ %Sat No results found for this basename: iron, tibc, ferritin, ironpctsat     EKG Orders placed during the hospital encounter of 08/02/13  . ED EKG  . ED EKG  . EKG 12-LEAD  . EKG 12-LEAD  . EKG 12-LEAD  . EKG 12-LEAD  . EKG     Prior Assessment and Plan Problem List as of 08/19/2013     Cardiovascular and Mediastinum   Nonischemic cardiomyopathy     Respiratory   Bronchospasm     Nervous and Auditory   Post traumatic myelopathy   Last Assessment & Plan   10/02/2011 Office Visit Written 10/03/2011  6:26 AM by Ozella Rocksavid J Merrell, MD     Referral to [facility] as below      Genitourinary   Urinary tract infection, site not specified   Last Assessment & Plan   07/21/2013 Office Visit Edited 07/22/2013  4:54 PM by Glori LuisEric G Sonnenberg, MD     UA concerning for UTI. Will treat with cipro. UCx collected. Given return precautions.  Addendum 07/22/13: WBC elevated on check to 28.8, given UA findings this raises the concern for a  pyelonephritis, though patient did not report fever or abdominal discomfort or  systemic symptoms and did not have CVA tenderness to indicate this level of infection. Additionally no other site of infection were evident on exam. I called the patient the morning of 07/22/13 to discuss this issue and he reported feeling improved today compared to yesterday. I again discussed return precautions with the patient and advised that he go to the ED for evaluation if he did not continue to feel better or if he developed fever.      Other   Bilateral leg edema   Last Assessment & Plan   06/06/2013 Office Visit Written 06/06/2013 12:40 PM by Ozella Rocks, MD     cmet today    Obesity   Last Assessment & Plan   06/06/2013 Office Visit Written 06/06/2013 12:38 PM by Ozella Rocks, MD     Wt down significantly.  Doing very well w/ diet and what exercise he's able to perform    Snoring   Last Assessment & Plan   11/25/2011 Office Visit Written 11/25/2011  5:44 PM by Ozella Rocks, MD     Nearly resolved per pt girlfriend Pt is likely reversing his OSA through wt loss     Lymphedema   Last Assessment & Plan   03/23/2013 Office Visit Written 03/23/2013  3:28 PM by Ozella Rocks, MD     improved    Pain in joint, shoulder region   Last Assessment & Plan   03/23/2013 Office Visit Written 03/23/2013  3:28 PM by Ozella Rocks, MD     Improved w/ wt lifting and worse if stops for prolonged time likely from deconditioning    Recurrent cellulitis of lower leg   Last Assessment & Plan   07/21/2013 Office Visit Edited 07/21/2013 12:46 PM by Glori Luis, MD     Patient completed treatment for cellulitis. Has no signs or symptoms at this time. Infection appears resolved. Discharge summary requested repeat CBC and BMET. Will order those today. Will continue to monitor for recurrence moving forward.    Left foot pain   Last Assessment & Plan   07/23/2012 Office Visit Written 07/24/2012 10:00 AM by Ozella Rocks, MD     MEchanical irritation from 1 particular pair of shoes. Pt w/ severely edematous LE.  Pt to use bacitracin ointment PRN and to protect area w/ gauze when wearing certain shoe or simply purchase different shoe w/ larger shoe box.    Low back pain   Last Assessment & Plan   07/21/2013 Office Visit Edited 07/21/2013  1:43 PM by Glori Luis, MD     Patient with onset of back pain this morning. He notes it feels like a urine infection. UA with positive nitrites. Will check urine culture. Will treat with cipro 500 mg BID for 7 days. He does have a significant spinal injury history so may be related to that. No red flags on exam or history. To use ibuprofen 600 mg q6 hr prn. Discussed use of muscle relaxer though patient declined at this time. If treatment of UTI does not improve pain will need to consider additional MSK causes and consider PT if not improving.    Cough   Last Assessment & Plan   06/06/2013 Office Visit Written 06/06/2013 12:38 PM by Ozella Rocks, MD     Likely secondary to post nasal drip that is allergic in nature May be residual from URI.  Start flonase Start Tessalon perls PRN No signs of ongoing infection  Cellulitis   Cellulitis and abscess of leg   Edema   Chest pain   History of spinal cord injury   Leukocytosis, unspecified   Atypical chest pain       Imaging: Ct Angio Chest Pe W/cm &/or Wo Cm  08/02/2013   CLINICAL DATA:  Left and substernal chest pain, diaphoresis, shortness of breath, and nausea today.  EXAM: CT ANGIOGRAPHY CHEST WITH CONTRAST  TECHNIQUE: Multidetector CT imaging of the chest was performed using the standard protocol during bolus administration of intravenous contrast. Multiplanar CT image reconstructions and MIPs were obtained to evaluate the vascular anatomy.  CONTRAST:  OMNIPAQUE IOHEXOL 350 MG/ML SOLN  COMPARISON:  None.  FINDINGS: Technically limited study due to poor contrast bolus. Only the distal portions of the  segmental pulmonary arteries in the lower lobes are well opacified. While no obvious central pulmonary emboli are identified, embolus cannot be excluded due to technical factors.  Normal caliber thoracic aorta. Mild cardiac enlargement. No significant lymphadenopathy in the chest. Esophagus is decompressed. Visualization of lungs is limited due to respiratory motion but no gross consolidation or airspace disease is suspected. Atelectasis in the lung bases. Airways appear patent. No pneumothorax. No pleural effusion. Visualized portions of the upper abdominal organs are grossly unremarkable. Postoperative changes in the lower cervical spine.  Review of the MIP images confirms the above findings.  IMPRESSION: Examination is indeterminate for pulmonary embolus due to limitations of contrast bolus.   Electronically Signed   By: Burman Nieves M.D.   On: 08/02/2013 22:38   Nm Myocar Multi W/spect W/wall Motion / Ef  08/04/2013   CLINICAL DATA:  43 year old male with hypertension, previous cervical spinal injury, morbid obesity, and chronic lymphedema. He presents with chest discomfort and has ruled out for myocardial infarction. This study is requested to evaluate for the presence and extent of ischemia.  EXAM: MYOCARDIAL IMAGING WITH SPECT (REST AND PHARMACOLOGIC-STRESS - 2 DAY PROTOCOL)  GATED LEFT VENTRICULAR WALL MOTION STUDY  LEFT VENTRICULAR EJECTION FRACTION  TECHNIQUE: Standard myocardial SPECT imaging was performed after resting intravenous injection of 10 mCi Tc-35m sestamibi. Subsequently, on a second day, intravenous infusion of Lexiscan was performed under the supervision of the Cardiology staff. At peak effect of the drug, 30 mCi Tc-50m sestamibi was injected intravenously and standard myocardial SPECT imaging was performed. Quantitative gated imaging was also performed to evaluate left ventricular wall motion, and estimate left ventricular ejection fraction.  FINDINGS: Baseline tracing shows sinus  rhythm at 88 beats per min. Lexiscan bolus was given in standard fashion. Heart rate increased from 85 beats per min up to 112 beats per min, and blood pressure increased from 93/72 up to 101/73. No chest pain was reported. There were no diagnostic ST segment abnormalities, and no arrhythmias were noted.  Analysis of the raw perfusion data shows significant chest wall attenuation despite use of a 2 day protocol.  Tomographic views were obtained using the short axis, vertical long axis, and horizontal long axis planes. There are moderate-sized defects in the anterior and inferolateral wall, both fixed, and suggestive of soft tissue attenuation. There are no obvious large ischemic territories.  Gated imaging reveals an EDV of 142, a ESV of 93, LVEF of 35%, and TID ratio of 1.09. No focal wall motion abnormalities are identified.  IMPRESSION: Intermediate risk abnormal Lexiscan Cardiolite as outlined. There were no diagnostic ST segment changes to indicate ischemia. Perfusion imaging is most suggestive of soft tissue attenuation affecting the anterior and inferolateral  walls, scar cannot be unequivocally excluded. No large ischemic territories were noted however. LVEF is calculated at 35% with diffuse hypokinesis and upper normal chamber volume. Could be consistent with a nonischemic cardiomyopathy.   Electronically Signed   By: Nona Dell M.D.   On: 08/04/2013 14:05   US Venous Img Lower Unilateral Left  07/29/2013   CLINICAL DATA:  Left lower extremity edema.  EXAM: LEFT LOWER EXTREMITY VENOUS DOPPLER ULTRASOUND  TECHNIQUE: Gray-scale sonography with graded compression, as well as color Doppler and duplex ultrasound, were performed to evaluate the deep venous system from the level of the common femoral vein through the popliteal and proximal calf veins. Spectral Doppler was utilized to evaluate flow at rest and with distal augmentation maneuvers.  COMPARISON:  None.  FINDINGS: Normal compressibility, color  Doppler flow and augmentation of the left common femoral vein, left femoral vein and left popliteal vein. The visualized left calf veins are patent. Limited evaluation of the calf veins.  IMPRESSION: Negative for left lower extremity deep vein thrombosis.   Electronically Signed   By: Richarda Overlie M.D.   On: 07/29/2013 17:20   Dg Chest Port 1 View  08/02/2013   CLINICAL DATA:  Left side chest pain for 2 hr.  EXAM: PORTABLE CHEST - 1 VIEW  COMPARISON:  Single view of the chest 06/10/2013. PA and lateral chest 04/23/2012.  FINDINGS: Lungs are clear. Heart size is normal. No pneumothorax pleural effusion.  IMPRESSION: No acute disease.   Electronically Signed   By: Drusilla Kanner M.D.   On: 08/02/2013 12:55
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
## 2013-08-19 NOTE — Assessment & Plan Note (Signed)
No cardiac complaints. He is doing very well. He is medically complaint. He wants to start [employer] exercise with [employer]. I think this will be very beneficial for him. I have warned him to keep track of his BP with weight loss, and we may need to decrease doses if necessary if it becomes too low.

## 2013-08-19 NOTE — Assessment & Plan Note (Signed)
Working with PT for help with this. He is wheelchair dependent.

## 2013-08-19 NOTE — Assessment & Plan Note (Signed)
Resolved

## 2013-08-19 NOTE — Patient Instructions (Signed)
Your physician wants you to follow-up in: 6 months You will receive a reminder letter in the mail two months in advance. If you don't receive a letter, please call our office to schedule the follow-up appointment.     Your physician recommends that you continue on your current medications as directed. Please refer to the Current Medication list given to you today.      Thank you for choosing [facility] !        

## 2013-08-21 NOTE — Assessment & Plan Note (Signed)
Great results with previous attempt at diet and exercise consistent with previous assessment and plan notes. Pt. Would like to begin diet and exercise regimen again. Advised to discuss with cardiology for their recs given history of CHF, but did advise DASH diet. Will plan to refer to Nutrition for further counseling.

## 2013-08-21 NOTE — Assessment & Plan Note (Signed)
Followed by cardiology. Last Echo with EF of 45%. ACE, Bblocker, Lasix. Will increase dose of lasix at this visit to 40mg  qd for improvement of LE edema.

## 2013-08-22 ENCOUNTER — Ambulatory Visit (HOSPITAL_COMMUNITY): Payer: Medicare Other | Admitting: Physical Therapy

## 2013-08-23 ENCOUNTER — Ambulatory Visit (HOSPITAL_COMMUNITY): Payer: Medicare Other | Admitting: Physical Therapy

## 2013-08-25 ENCOUNTER — Ambulatory Visit (HOSPITAL_COMMUNITY)
Admission: RE | Admit: 2013-08-25 | Discharge: 2013-08-25 | Disposition: A | Discharge status: Home / Self Care | Payer: Medicare Other | Source: Ambulatory Visit | Attending: Family Medicine | Admitting: Family Medicine

## 2013-08-25 DIAGNOSIS — IMO0001 Reserved for inherently not codable concepts without codable children: Secondary | ICD-10-CM | POA: Insufficient documentation

## 2013-08-25 DIAGNOSIS — E669 Obesity, unspecified: Secondary | ICD-10-CM | POA: Diagnosis not present

## 2013-08-25 DIAGNOSIS — I890 Lymphedema, not elsewhere classified: Secondary | ICD-10-CM | POA: Diagnosis not present

## 2013-08-25 DIAGNOSIS — IMO0002 Reserved for concepts with insufficient information to code with codable children: Secondary | ICD-10-CM | POA: Insufficient documentation

## 2013-08-25 DIAGNOSIS — G8220 Paraplegia, unspecified: Secondary | ICD-10-CM | POA: Diagnosis not present

## 2013-08-25 NOTE — Progress Notes (Signed)
Physical Therapy Re-evaluation  Patient Details  Name: Jordan Jensen MRN: [id number] Date of Birth: 12/01/1970  Today's Date: 08/25/2013 Time: 1445-1530 PT Time Calculation (min): 45 min              Visit#: 9 of 19  Re-eval: 09/22/13 Authorization Visit#: 9 of 19   Subjective Symptoms/Limitations Symptoms: Pt returns today after recent hospitization for anxiety.  Pt states he was hospitilized X 5 days, admitted on7/28/15.  Last visit at this clinic was 07/27/13.  Pt states his legs have been doing surprisingly well.  Pt ambulated into clinic today using bilateral axillary crutches. Pain Assessment Currently in Pain?: No/denies   Objective: Manual Therapy Edema Management: Manual lymph drainage completed for B LE including supraclavicular, deep and superficial abdominal with fluid routed using Rt and Lt inguinal-axillary anastomosis; posterior aspect of LE completed by placing heel on bolster. Lt LE was then wrapped using foam and multilayer short stretch bancaging. taken   Volume of Rt LE on 7/20/205 Rt 7842.14;  LT 13,110.80;  A decrease in B volume Rt decreased by 346.84 cc;  LT 2159.27 cc  Date 08/25/2013    Right Left  MTP 28.50 28.80  ankle 36.2 39.7  4cm 35.60 42.20  8cm 40.30 55.40  12 cm 41.50 60.00  16cm 43.00 61.90  20cm 47.00 64.50  24cm 53.20 65.80  28cm 57.20 66.00  32cm 59.00 64.40  36cm 56.30 52.20      Sum of squares 23547.16 34405.23  Total Volume 7495.296 10951.53    Physical Therapy Assessment and Plan PT Assessment and Plan Clinical Impression Statement: Pt returns today to resume manual lymph drainage for bilateral LE/bandaging for Lt LE.  Bilateral LE's remeasured today with additional decompression noted despite lapse in treatment.  Pt has lost 346.84cc from [place] and 2,159.27cc from [place].  Pt has met all STG's and is progressing toward LTG's.  Pt would benefit from continued therapy X4 more weeks with anticipation of measurements for stockings X 2  more weeks.   PT Frequency: Min 3X/week PT Duration: 4 weeks PT Plan: continue with complete decongerstive techniques.   Remeasure on thursday.   Measurement for new stockings X 2 more weeks (sept 3)    Goals PT Short Term Goals Time to Complete Short Term Goals: 2 weeks PT Short Term Goal 1: Pt to be able to verbalize the importance of skin care. PT Short Term Goal 1 - Progress: Met PT Short Term Goal 2: Pt volume to be decreased by 20%  PT Short Term Goal 2 - Progress: Met PT Long Term Goals Time to Complete Long Term Goals: 4 weeks PT Long Term Goal 1: Pt volume to be decreased by 40%  PT Long Term Goal 1 - Progress: Progressing toward goal PT Long Term Goal 2: Pt to be able to verbalize the importance of keeping compression garments or Reid sleeve on at all times  PT Long Term Goal 2 - Progress: Progressing toward goal  Problem List Patient Active Problem List   Diagnosis Date Noted  . Nonischemic cardiomyopathy 08/04/2013  . Atypical chest pain 08/03/2013  . Chest pain 08/02/2013  . Bronchospasm 08/02/2013  . History of spinal cord injury 08/02/2013  . Leukocytosis, unspecified 08/02/2013  . Urinary tract infection, site not specified 07/21/2013  . Edema 06/28/2013  . Cellulitis 06/10/2013  . Cellulitis and abscess of leg 06/10/2013  . Cough 06/06/2013  . Low back pain 03/23/2013  . Left foot pain 07/24/2012  .  Recurrent cellulitis of lower leg 05/05/2012  . Pain in joint, shoulder region 03/16/2012  . Lymphedema 11/07/2011  . Obesity 07/05/2011  . Snoring 07/05/2011  . Bilateral leg edema   . Post traumatic myelopathy        GP Functional Assessment Tool Used: clinical judgement Functional Limitation: Other PT primary Other PT Primary Current Status (R8309): At least 40 percent but less than 60 percent impaired, limited or restricted Other PT Primary Goal Status (M0768): At least 20 percent but less than 40 percent impaired, limited or restricted  Teena Irani, PTA/CLT 08/25/2013, 3:44 PM

## 2013-08-26 ENCOUNTER — Ambulatory Visit (HOSPITAL_COMMUNITY): Payer: Medicare Other | Admitting: Physical Therapy

## 2013-08-29 ENCOUNTER — Ambulatory Visit (HOSPITAL_COMMUNITY): Payer: Medicare Other

## 2013-08-30 ENCOUNTER — Ambulatory Visit (HOSPITAL_COMMUNITY)
Admission: RE | Admit: 2013-08-30 | Discharge: 2013-08-30 | Disposition: A | Discharge status: Home / Self Care | Payer: Medicare Other | Source: Ambulatory Visit | Attending: Family Medicine | Admitting: Family Medicine

## 2013-08-30 DIAGNOSIS — G8220 Paraplegia, unspecified: Secondary | ICD-10-CM | POA: Diagnosis not present

## 2013-08-30 DIAGNOSIS — R609 Edema, unspecified: Secondary | ICD-10-CM

## 2013-08-30 DIAGNOSIS — E669 Obesity, unspecified: Secondary | ICD-10-CM | POA: Diagnosis not present

## 2013-08-30 DIAGNOSIS — IMO0001 Reserved for inherently not codable concepts without codable children: Secondary | ICD-10-CM | POA: Diagnosis not present

## 2013-08-30 DIAGNOSIS — I890 Lymphedema, not elsewhere classified: Secondary | ICD-10-CM | POA: Diagnosis not present

## 2013-08-30 DIAGNOSIS — IMO0002 Reserved for concepts with insufficient information to code with codable children: Secondary | ICD-10-CM | POA: Diagnosis not present
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
## 2013-08-31 ENCOUNTER — Ambulatory Visit (HOSPITAL_COMMUNITY): Payer: Medicare Other | Admitting: Physical Therapy

## 2013-09-01 ENCOUNTER — Ambulatory Visit (HOSPITAL_COMMUNITY): Payer: Medicare Other | Admitting: Physical Therapy

## 2013-09-02 ENCOUNTER — Ambulatory Visit (HOSPITAL_COMMUNITY): Payer: Medicare Other

## 2013-09-03 IMAGING — CR DG CHEST 2V
2 series · 2 of 2 positions shown · non-contrast
Comparison: Chest radiograph performed 02/16/2012

CLINICAL DATA: Fever and shortness of breath.

CHEST - 2 VIEW

[view not recorded (1 of 2)]
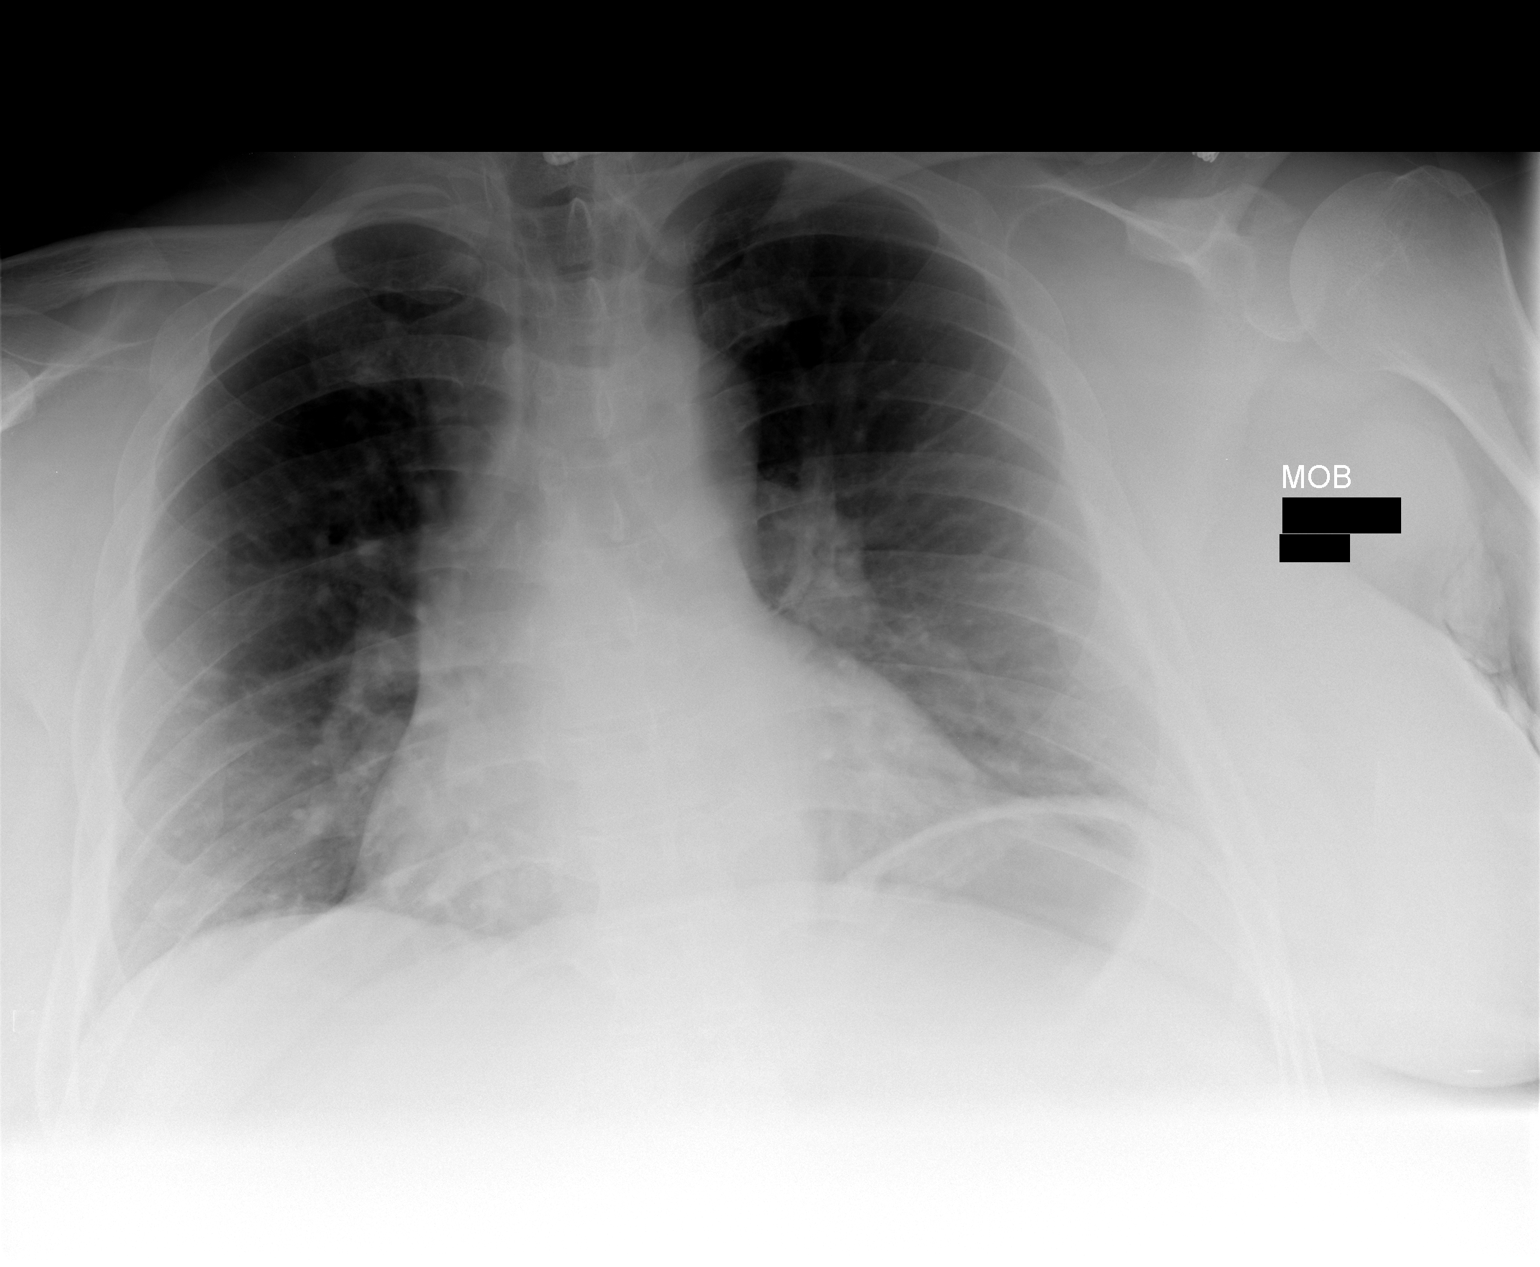

[view not recorded (2 of 2)]
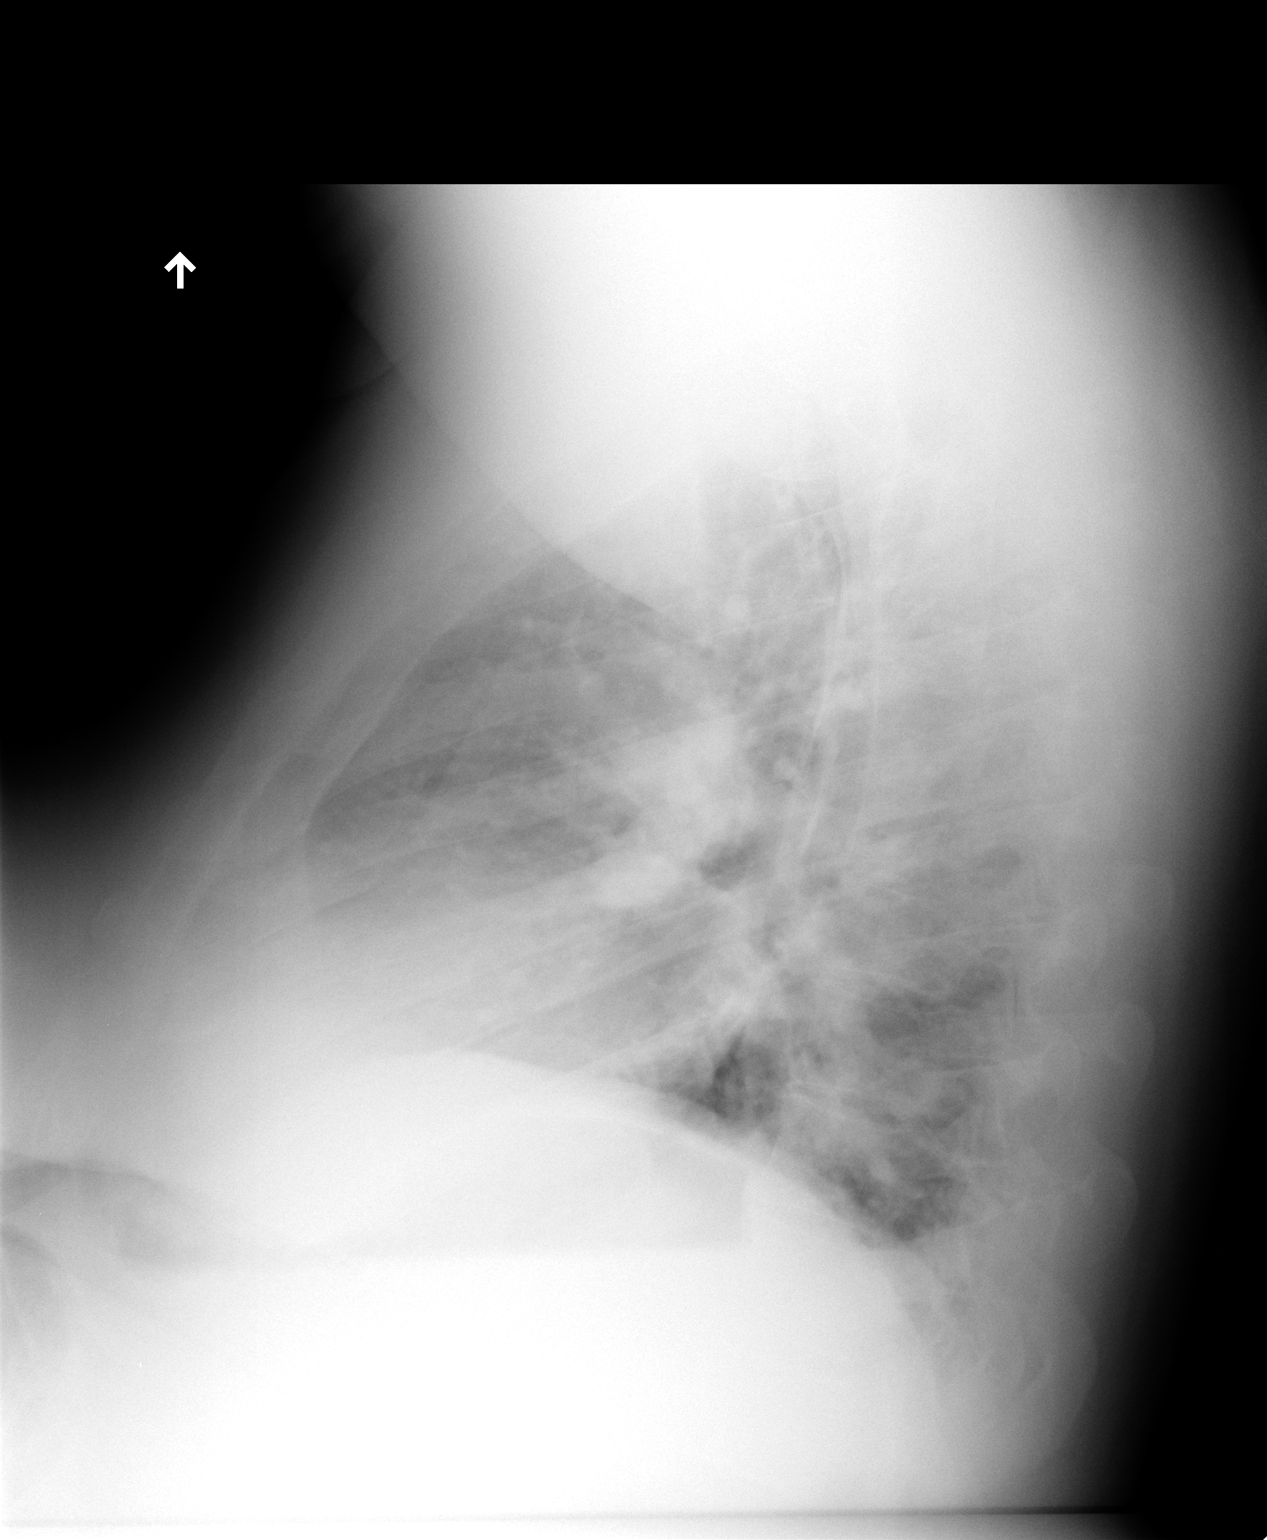

[2 of 2 positions shown; findings below may reference images not displayed]

FINDINGS: The lungs are well-aerated.  Mild vascular congestion is
noted.  There is no evidence of focal opacification, pleural
effusion or pneumothorax.  The lateral view is somewhat suboptimal
due to the patient's habitus.

The heart is normal in size; the mediastinal contour is within
normal limits.  No acute osseous abnormalities are seen.  Cervical
spinal fusion hardware is noted.
IMPRESSION: Mild vascular congestion noted; lungs remain grossly clear.

## 2013-09-06 ENCOUNTER — Ambulatory Visit (HOSPITAL_COMMUNITY)
Admission: RE | Admit: 2013-09-06 | Discharge: 2013-09-06 | Disposition: A | Discharge status: Home / Self Care | Payer: Medicare Other | Source: Ambulatory Visit | Attending: Family Medicine | Admitting: Family Medicine

## 2013-09-06 DIAGNOSIS — E669 Obesity, unspecified: Secondary | ICD-10-CM | POA: Insufficient documentation

## 2013-09-06 DIAGNOSIS — IMO0001 Reserved for inherently not codable concepts without codable children: Secondary | ICD-10-CM | POA: Insufficient documentation

## 2013-09-06 DIAGNOSIS — G8220 Paraplegia, unspecified: Secondary | ICD-10-CM | POA: Insufficient documentation

## 2013-09-06 DIAGNOSIS — IMO0002 Reserved for concepts with insufficient information to code with codable children: Secondary | ICD-10-CM | POA: Insufficient documentation

## 2013-09-06 DIAGNOSIS — R609 Edema, unspecified: Secondary | ICD-10-CM | POA: Diagnosis not present
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
## 2013-09-08 ENCOUNTER — Ambulatory Visit (HOSPITAL_COMMUNITY): Payer: Medicare Other | Admitting: Physical Therapy

## 2013-09-14 ENCOUNTER — Ambulatory Visit (HOSPITAL_COMMUNITY): Payer: Medicare Other | Admitting: Physical Therapy

## 2013-10-02 ENCOUNTER — Encounter (HOSPITAL_COMMUNITY): Payer: Self-pay | Admitting: Emergency Medicine

## 2013-10-02 ENCOUNTER — Inpatient Hospital Stay (HOSPITAL_COMMUNITY)
Admission: EM | Admit: 2013-10-02 | Discharge: 2013-10-06 | DRG: 603 | Disposition: A | Discharge status: Home / Self Care | Payer: Medicare Other | Attending: Internal Medicine | Admitting: Internal Medicine

## 2013-10-02 DIAGNOSIS — Z79899 Other long term (current) drug therapy: Secondary | ICD-10-CM | POA: Diagnosis not present

## 2013-10-02 DIAGNOSIS — L03119 Cellulitis of unspecified part of limb: Secondary | ICD-10-CM | POA: Diagnosis not present

## 2013-10-02 DIAGNOSIS — R0602 Shortness of breath: Secondary | ICD-10-CM

## 2013-10-02 DIAGNOSIS — Z87891 Personal history of nicotine dependence: Secondary | ICD-10-CM

## 2013-10-02 DIAGNOSIS — L03116 Cellulitis of left lower limb: Secondary | ICD-10-CM | POA: Diagnosis not present

## 2013-10-02 DIAGNOSIS — E669 Obesity, unspecified: Secondary | ICD-10-CM

## 2013-10-02 DIAGNOSIS — R609 Edema, unspecified: Secondary | ICD-10-CM

## 2013-10-02 DIAGNOSIS — I10 Essential (primary) hypertension: Secondary | ICD-10-CM | POA: Diagnosis present

## 2013-10-02 DIAGNOSIS — D696 Thrombocytopenia, unspecified: Secondary | ICD-10-CM | POA: Diagnosis present

## 2013-10-02 DIAGNOSIS — I951 Orthostatic hypotension: Secondary | ICD-10-CM

## 2013-10-02 DIAGNOSIS — D72829 Elevated white blood cell count, unspecified: Secondary | ICD-10-CM

## 2013-10-02 DIAGNOSIS — Z993 Dependence on wheelchair: Secondary | ICD-10-CM

## 2013-10-02 DIAGNOSIS — Z809 Family history of malignant neoplasm, unspecified: Secondary | ICD-10-CM | POA: Diagnosis not present

## 2013-10-02 DIAGNOSIS — L02419 Cutaneous abscess of limb, unspecified: Secondary | ICD-10-CM

## 2013-10-02 DIAGNOSIS — Z981 Arthrodesis status: Secondary | ICD-10-CM

## 2013-10-02 DIAGNOSIS — R0789 Other chest pain: Secondary | ICD-10-CM

## 2013-10-02 DIAGNOSIS — Z23 Encounter for immunization: Secondary | ICD-10-CM

## 2013-10-02 DIAGNOSIS — L0390 Cellulitis, unspecified: Secondary | ICD-10-CM | POA: Diagnosis present

## 2013-10-02 DIAGNOSIS — Z87828 Personal history of other (healed) physical injury and trauma: Secondary | ICD-10-CM

## 2013-10-02 DIAGNOSIS — I890 Lymphedema, not elsewhere classified: Secondary | ICD-10-CM

## 2013-10-02 DIAGNOSIS — I428 Other cardiomyopathies: Secondary | ICD-10-CM

## 2013-10-02 DIAGNOSIS — R600 Localized edema: Secondary | ICD-10-CM | POA: Diagnosis present

## 2013-10-02 DIAGNOSIS — R079 Chest pain, unspecified: Secondary | ICD-10-CM | POA: Diagnosis not present

## 2013-10-02 DIAGNOSIS — Z6841 Body Mass Index (BMI) 40.0 and over, adult: Secondary | ICD-10-CM | POA: Diagnosis not present

## 2013-10-02 DIAGNOSIS — M79672 Pain in left foot: Secondary | ICD-10-CM

## 2013-10-02 DIAGNOSIS — Z833 Family history of diabetes mellitus: Secondary | ICD-10-CM | POA: Diagnosis not present

## 2013-10-02 DIAGNOSIS — I878 Other specified disorders of veins: Secondary | ICD-10-CM | POA: Diagnosis present

## 2013-10-02 DIAGNOSIS — R509 Fever, unspecified: Secondary | ICD-10-CM | POA: Diagnosis not present

## 2013-10-02 DIAGNOSIS — R059 Cough, unspecified: Secondary | ICD-10-CM

## 2013-10-02 DIAGNOSIS — R05 Cough: Secondary | ICD-10-CM

## 2013-10-02 DIAGNOSIS — Z7982 Long term (current) use of aspirin: Secondary | ICD-10-CM | POA: Diagnosis not present

## 2013-10-02 DIAGNOSIS — R0683 Snoring: Secondary | ICD-10-CM

## 2013-10-02 DIAGNOSIS — J9801 Acute bronchospasm: Secondary | ICD-10-CM

## 2013-10-02 DIAGNOSIS — I872 Venous insufficiency (chronic) (peripheral): Secondary | ICD-10-CM | POA: Diagnosis not present

## 2013-10-02 DIAGNOSIS — M7989 Other specified soft tissue disorders: Secondary | ICD-10-CM | POA: Diagnosis not present

## 2013-10-02 DIAGNOSIS — G9589 Other specified diseases of spinal cord: Secondary | ICD-10-CM

## 2013-10-02 LAB — BASIC METABOLIC PANEL
Anion gap: 9 (ref 5–15)
BUN: 11 mg/dL (ref 6–23)
CHLORIDE: 100 meq/L (ref 96–112)
CO2: 26 mEq/L (ref 19–32)
Calcium: 9.3 mg/dL (ref 8.4–10.5)
Creatinine, Ser: 1.13 mg/dL (ref 0.50–1.35)
GFR, EST AFRICAN AMERICAN: 90 mL/min — AB (ref >90)
GFR, EST NON AFRICAN AMERICAN: 77 mL/min — AB (ref >90)
Glucose, Bld: 97 mg/dL (ref 70–99)
POTASSIUM: 3.6 meq/L — AB (ref 3.7–5.3)
SODIUM: 135 meq/L — AB (ref 137–147)

## 2013-10-02 LAB — CBC WITH DIFFERENTIAL/PLATELET
BASOS PCT: 0 % (ref 0–1)
Basophils Absolute: 0 10*3/uL (ref 0.0–0.1)
EOS ABS: 0 10*3/uL (ref 0.0–0.7)
Eosinophils Relative: 0 % (ref 0–5)
HCT: 40.3 % (ref 39.0–52.0)
Hemoglobin: 13.8 g/dL (ref 13.0–17.0)
Lymphocytes Relative: 6 % — ABNORMAL LOW (ref 12–46)
Lymphs Abs: 1.2 10*3/uL (ref 0.7–4.0)
MCH: 30.7 pg (ref 26.0–34.0)
MCHC: 34.2 g/dL (ref 30.0–36.0)
MCV: 89.8 fL (ref 78.0–100.0)
Monocytes Absolute: 0.6 10*3/uL (ref 0.1–1.0)
Monocytes Relative: 3 % (ref 3–12)
NEUTROS ABS: 20.2 10*3/uL — AB (ref 1.7–7.7)
Neutrophils Relative %: 91 % — ABNORMAL HIGH (ref 43–77)
PLATELETS: 162 10*3/uL (ref 150–400)
RBC: 4.49 MIL/uL (ref 4.22–5.81)
RDW: 12.8 % (ref 11.5–15.5)
WBC: 22.1 10*3/uL — ABNORMAL HIGH (ref 4.0–10.5)

## 2013-10-02 LAB — URINALYSIS, ROUTINE W REFLEX MICROSCOPIC
Glucose, UA: NEGATIVE mg/dL
Hgb urine dipstick: NEGATIVE
LEUKOCYTES UA: NEGATIVE
Nitrite: NEGATIVE
Protein, ur: NEGATIVE mg/dL
Specific Gravity, Urine: 1.02 (ref 1.005–1.030)
UROBILINOGEN UA: 0.2 mg/dL (ref 0.0–1.0)
pH: 6 (ref 5.0–8.0)

## 2013-10-02 LAB — LACTIC ACID, PLASMA: LACTIC ACID, VENOUS: 1.3 mmol/L (ref 0.5–2.2)

## 2013-10-02 MED ORDER — ACETAMINOPHEN 500 MG PO TABS
1000.0000 mg | ORAL_TABLET | Freq: Once | ORAL | Status: AC
  Administered 2013-10-02: 1000 mg via ORAL
  Filled 2013-10-02: qty 2

## 2013-10-02 MED ORDER — MORPHINE SULFATE 2 MG/ML IJ SOLN: 2.0000 mg | INTRAMUSCULAR | Status: DC | PRN

## 2013-10-02 MED ORDER — SODIUM CHLORIDE 0.9 % IV SOLN
INTRAVENOUS | Status: DC
  Administered 2013-10-02: 23:00:00 via INTRAVENOUS
  Administered 2013-10-02: 1000 mL via INTRAVENOUS
  Administered 2013-10-03: 75 mL/h via INTRAVENOUS
  Administered 2013-10-03 – 2013-10-04 (×2): via INTRAVENOUS

## 2013-10-02 MED ORDER — SODIUM CHLORIDE 0.9 % IJ SOLN
3.0000 mL | Freq: Two times a day (BID) | INTRAMUSCULAR | Status: DC
  Administered 2013-10-04 – 2013-10-06 (×4): 3 mL via INTRAVENOUS

## 2013-10-02 MED ORDER — ACETAMINOPHEN 650 MG RE SUPP: 650.0000 mg | Freq: Four times a day (QID) | RECTAL | Status: DC | PRN

## 2013-10-02 MED ORDER — ONDANSETRON HCL 4 MG PO TABS: 4.0000 mg | ORAL_TABLET | Freq: Four times a day (QID) | ORAL | Status: DC | PRN

## 2013-10-02 MED ORDER — SODIUM CHLORIDE 0.9 % IV SOLN: 250.0000 mL | INTRAVENOUS | Status: DC | PRN

## 2013-10-02 MED ORDER — LIDOCAINE HCL 2 % EX GEL
CUTANEOUS | Status: AC
  Administered 2013-10-02: 1
  Filled 2013-10-02: qty 10

## 2013-10-02 MED ORDER — SODIUM CHLORIDE 0.9 % IJ SOLN: 3.0000 mL | INTRAMUSCULAR | Status: DC | PRN

## 2013-10-02 MED ORDER — VANCOMYCIN HCL IN DEXTROSE 1-5 GM/200ML-% IV SOLN
1000.0000 mg | INTRAVENOUS | Status: AC
  Administered 2013-10-02 (×2): 1000 mg via INTRAVENOUS
  Filled 2013-10-02: qty 200

## 2013-10-02 MED ORDER — METOPROLOL SUCCINATE ER 25 MG PO TB24
25.0000 mg | ORAL_TABLET | Freq: Every day | ORAL | Status: DC
  Administered 2013-10-03 – 2013-10-06 (×3): 25 mg via ORAL
  Filled 2013-10-02 (×4): qty 1

## 2013-10-02 MED ORDER — PNEUMOCOCCAL VAC POLYVALENT 25 MCG/0.5ML IJ INJ
0.5000 mL | INJECTION | INTRAMUSCULAR | Status: AC
  Administered 2013-10-03: 0.5 mL via INTRAMUSCULAR
  Filled 2013-10-02: qty 0.5

## 2013-10-02 MED ORDER — ENOXAPARIN SODIUM 40 MG/0.4ML ~~LOC~~ SOLN: 40.0000 mg | SUBCUTANEOUS | Status: DC

## 2013-10-02 MED ORDER — VANCOMYCIN HCL 10 G IV SOLR
1500.0000 mg | Freq: Two times a day (BID) | INTRAVENOUS | Status: DC
  Administered 2013-10-03 – 2013-10-05 (×5): 1500 mg via INTRAVENOUS
  Filled 2013-10-02 (×8): qty 1500

## 2013-10-02 MED ORDER — FUROSEMIDE 40 MG PO TABS
40.0000 mg | ORAL_TABLET | Freq: Every day | ORAL | Status: DC
  Administered 2013-10-03 – 2013-10-06 (×4): 40 mg via ORAL
  Filled 2013-10-02 (×4): qty 1

## 2013-10-02 MED ORDER — ASPIRIN EC 81 MG PO TBEC
81.0000 mg | DELAYED_RELEASE_TABLET | Freq: Every day | ORAL | Status: DC
  Administered 2013-10-03 – 2013-10-06 (×4): 81 mg via ORAL
  Filled 2013-10-02 (×4): qty 1

## 2013-10-02 MED ORDER — SODIUM CHLORIDE 0.9 % IV BOLUS (SEPSIS)
500.0000 mL | Freq: Once | INTRAVENOUS | Status: AC
  Administered 2013-10-02: 500 mL via INTRAVENOUS

## 2013-10-02 MED ORDER — FUROSEMIDE 40 MG PO TABS: 40.0000 mg | ORAL_TABLET | Freq: Every day | ORAL | Status: DC

## 2013-10-02 MED ORDER — INFLUENZA VAC SPLIT QUAD 0.5 ML IM SUSY
0.5000 mL | PREFILLED_SYRINGE | INTRAMUSCULAR | Status: AC
  Administered 2013-10-03: 0.5 mL via INTRAMUSCULAR
  Filled 2013-10-02: qty 0.5

## 2013-10-02 MED ORDER — ONDANSETRON HCL 4 MG/2ML IJ SOLN: 4.0000 mg | Freq: Four times a day (QID) | INTRAMUSCULAR | Status: DC | PRN

## 2013-10-02 MED ORDER — ACETAMINOPHEN 325 MG PO TABS
650.0000 mg | ORAL_TABLET | Freq: Four times a day (QID) | ORAL | Status: DC | PRN
Start: 2013-10-02 — End: 2013-10-06
  Administered 2013-10-03: 650 mg via ORAL
  Filled 2013-10-02: qty 2
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
## 2013-10-02 NOTE — ED Notes (Signed)
Remains alert, skin w/d, denies lightheadedness.

## 2013-10-02 NOTE — ED Notes (Signed)
Pt c/o fever, chills, lower back pain, body aches, small amount of urination at a time, dark urine that started last night.
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
## 2013-10-02 NOTE — ED Notes (Signed)
Family at bedside. Patient unable to void at this time.

## 2013-10-02 NOTE — ED Notes (Signed)
Unable to urinate, In and Out cath done using lidocaine per pt request
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
## 2013-10-03 ENCOUNTER — Inpatient Hospital Stay (HOSPITAL_COMMUNITY): Payer: Medicare Other

## 2013-10-03 DIAGNOSIS — D696 Thrombocytopenia, unspecified: Secondary | ICD-10-CM | POA: Diagnosis present

## 2013-10-03 DIAGNOSIS — I890 Lymphedema, not elsewhere classified: Secondary | ICD-10-CM

## 2013-10-03 DIAGNOSIS — R0602 Shortness of breath: Secondary | ICD-10-CM | POA: Diagnosis present

## 2013-10-03 DIAGNOSIS — L02419 Cutaneous abscess of limb, unspecified: Secondary | ICD-10-CM | POA: Diagnosis not present

## 2013-10-03 LAB — CBC
HCT: 37.3 % — ABNORMAL LOW (ref 39.0–52.0)
Hemoglobin: 12.5 g/dL — ABNORMAL LOW (ref 13.0–17.0)
MCH: 30 pg (ref 26.0–34.0)
MCHC: 33.5 g/dL (ref 30.0–36.0)
MCV: 89.7 fL (ref 78.0–100.0)
Platelets: 142 10*3/uL — ABNORMAL LOW (ref 150–400)
RBC: 4.16 MIL/uL — AB (ref 4.22–5.81)
RDW: 13.1 % (ref 11.5–15.5)
WBC: 14.8 10*3/uL — ABNORMAL HIGH (ref 4.0–10.5)

## 2013-10-03 LAB — COMPREHENSIVE METABOLIC PANEL
ALT: 20 U/L (ref 0–53)
AST: 22 U/L (ref 0–37)
Albumin: 3.1 g/dL — ABNORMAL LOW (ref 3.5–5.2)
Alkaline Phosphatase: 43 U/L (ref 39–117)
Anion gap: 10 (ref 5–15)
BUN: 10 mg/dL (ref 6–23)
CALCIUM: 8.4 mg/dL (ref 8.4–10.5)
CO2: 25 meq/L (ref 19–32)
CREATININE: 0.99 mg/dL (ref 0.50–1.35)
Chloride: 105 mEq/L (ref 96–112)
GFR calc Af Amer: >90 mL/min (ref >90)
Glucose, Bld: 118 mg/dL — ABNORMAL HIGH (ref 70–99)
Potassium: 3.8 mEq/L (ref 3.7–5.3)
SODIUM: 140 meq/L (ref 137–147)
TOTAL PROTEIN: 6.7 g/dL (ref 6.0–8.3)
Total Bilirubin: 1 mg/dL (ref 0.3–1.2)

## 2013-10-03 MED ORDER — ALBUTEROL SULFATE (2.5 MG/3ML) 0.083% IN NEBU
2.5000 mg | INHALATION_SOLUTION | Freq: Four times a day (QID) | RESPIRATORY_TRACT | Status: DC | PRN
  Administered 2013-10-03: 2.5 mg via RESPIRATORY_TRACT
  Filled 2013-10-03: qty 3

## 2013-10-03 MED ORDER — ENOXAPARIN SODIUM 80 MG/0.8ML ~~LOC~~ SOLN
80.0000 mg | SUBCUTANEOUS | Status: DC
  Administered 2013-10-03 – 2013-10-06 (×4): 80 mg via SUBCUTANEOUS
  Filled 2013-10-03 (×4): qty 0.8

## 2013-10-03 NOTE — Progress Notes (Signed)
Patient seen and examined. Note reviewed.  She's been admitted to the hospital with cellulitis. He has chronic lymphedema. He's been started on vancomycin with improvement of his WBC count. Continue with current treatments. Keep legs elevated. Dyspnea appears to have resolved and his chest x-ray appears unremarkable. We'll continue with current treatment. I anticipate he'll be in the hospital a few days  MEMON,JEHANZEB

## 2013-10-03 NOTE — Progress Notes (Signed)
[facility] PROGRESS NOTE  EARLIN SWEEDEN ZOX:[id number] DOB: 1970-05-15 DOA: 10/02/2013 PCP: Yolande Jolly, MD  Assessment/Plan: Cellulitis of left leg: not much improvement this am. Will continue IV vancomycin day #2. Blood cultures with no growth to date. Max temp 100.8 rectally. He appears non-toxic. Of note, hx of same and has been on cleocin prn. States he "ran out" of meds this time.   Dyspnea: patient appears sob lying in bed. Chest xray without active disease and stable cardiomegaly.  Wife indicates effort more labored. No cough. Oxygen saturation level >90% on room air. monitor   Hypertension: controlled.  Continue metoprolol 25 mg by mouth daily.   Leukocytosis: mild. Likely related to #1. See #1 plan. Monitor  Thrombocytopenia: no hx of same. Likely reactive. Will monitor.   Nonischemic cardiomyopathy: chart review indicates last echo 7/15 yields EF 45% to 50%. Diffuse hypokinesis. Doppler parameters are consistent with abnormal left ventricular relaxation (grade 1 diastolic dysfunction). Continue BB and lasix. Monitor intake and output. Daily weights.    Bilateral lymphedema. Hx of same. PT in past. He is wheelchair bound.     Obesity: BMI 52.6. Hx of weight loss. Nutritional consult.   Chronic lymphedema: provided with Lasix 40 mg by mouth daily. Patient has been taking this at home.     Code Status: full Family Communication: wife at bedside  (indicate person spoken with, relationship, and if by phone, the number) Disposition Plan: home when ready   Consultants:  none  Procedures:  none  Antibiotics:  Vancomycin 10/02/13>>  HPI/Subjective: Awake alert. Reports pain in left leg improved  Objective: Filed Vitals:   10/02/13 2240  BP: 95/57  Pulse: 84  Temp: 98.7 F (37.1 C)  Resp: 22   No intake or output data in the 24 hours ending 10/03/13 1310 Filed Weights   10/02/13 1758 10/02/13 2240  Weight: 154.223 kg (340 lb) 170.552 kg (376 lb)     Exam:   General:  Obese somewhat sob  Cardiovascular: RRR no MGR Left leg with erythema and heat mild tenderness  Respiratory: mild increased work of breathing. Fine crackles bases no wheeze  Abdomen: obese soft +BS  Non-tender to palpation  Musculoskeletal:  Right leg with chronic venous stasis changes. Left leg larger than right with heat, tenderness and erythema  Data Reviewed: Basic Metabolic Panel:  Recent Labs Lab 10/02/13 1825 10/03/13 0432  NA 135* 140  K 3.6* 3.8  CL 100 105  CO2 26 25  GLUCOSE 97 118*  BUN 11 10  CREATININE 1.13 0.99  CALCIUM 9.3 8.4   Liver Function Tests:  Recent Labs Lab 10/03/13 0432  AST 22  ALT 20  ALKPHOS 43  BILITOT 1.0  PROT 6.7  ALBUMIN 3.1*   No results found for this basename: LIPASE, AMYLASE,  in the last 168 hours No results found for this basename: AMMONIA,  in the last 168 hours CBC:  Recent Labs Lab 10/02/13 1825 10/03/13 0432  WBC 22.1* 14.8*  NEUTROABS 20.2*  --   HGB 13.8 12.5*  HCT 40.3 37.3*  MCV 89.8 89.7  PLT 162 142*   Cardiac Enzymes: No results found for this basename: CKTOTAL, CKMB, CKMBINDEX, TROPONINI,  in the last 168 hours BNP (last 3 results) No results found for this basename: PROBNP,  in the last 8760 hours CBG: No results found for this basename: GLUCAP,  in the last 168 hours  Recent Results (from the past 240 hour(s))  CULTURE, BLOOD (ROUTINE X 2)  Status: None   Collection Time    10/02/13  6:46 PM      Result Value Ref Range Status   Specimen Description RIGHT ANTECUBITAL   Final   Special Requests BOTTLES DRAWN AEROBIC ONLY 6CC   Final   Culture NO GROWTH 1 DAY   Final   Report Status PENDING   Incomplete  CULTURE, BLOOD (ROUTINE X 2)     Status: None   Collection Time    10/02/13  6:51 PM      Result Value Ref Range Status   Specimen Description BLOOD LEFT HAND   Final   Special Requests BOTTLES DRAWN AEROBIC AND ANAEROBIC 10CC   Final   Culture NO GROWTH 1 DAY    Final   Report Status PENDING   Incomplete     Studies: No results found.  Scheduled Meds: . aspirin EC  81 mg Oral Daily  . enoxaparin (LOVENOX) injection  80 mg Subcutaneous Q24H  . furosemide  40 mg Oral Daily  . metoprolol succinate  25 mg Oral Daily  . sodium chloride  3 mL Intravenous Q12H  . vancomycin  1,500 mg Intravenous Q12H   Continuous Infusions: . sodium chloride 75 mL/hr (10/03/13 1101)    Principal Problem:   Cellulitis Active Problems:   Bilateral leg edema   Obesity   Cellulitis and abscess of leg   History of spinal cord injury   Nonischemic cardiomyopathy   Fever   SOB (shortness of breath)    Time spent: 35 minutes    [facility] M  [facility] Pager [phone]. If 7PM-7AM, please contact night-coverage at [email or web address], password [facility] 10/03/2013, 1:10 PM  LOS: 1 day

## 2013-10-04 ENCOUNTER — Encounter (HOSPITAL_COMMUNITY): Payer: Medicare Other

## 2013-10-04 DIAGNOSIS — D72829 Elevated white blood cell count, unspecified: Secondary | ICD-10-CM

## 2013-10-04 LAB — BASIC METABOLIC PANEL
Anion gap: 9 (ref 5–15)
BUN: 6 mg/dL (ref 6–23)
CHLORIDE: 104 meq/L (ref 96–112)
CO2: 26 meq/L (ref 19–32)
CREATININE: 0.96 mg/dL (ref 0.50–1.35)
Calcium: 8.7 mg/dL (ref 8.4–10.5)
GFR calc non Af Amer: >90 mL/min (ref >90)
Glucose, Bld: 98 mg/dL (ref 70–99)
POTASSIUM: 4 meq/L (ref 3.7–5.3)
SODIUM: 139 meq/L (ref 137–147)

## 2013-10-04 LAB — CBC
HCT: 38 % — ABNORMAL LOW (ref 39.0–52.0)
Hemoglobin: 12.5 g/dL — ABNORMAL LOW (ref 13.0–17.0)
MCH: 29.4 pg (ref 26.0–34.0)
MCHC: 32.9 g/dL (ref 30.0–36.0)
MCV: 89.4 fL (ref 78.0–100.0)
PLATELETS: 153 10*3/uL (ref 150–400)
RBC: 4.25 MIL/uL (ref 4.22–5.81)
RDW: 13.1 % (ref 11.5–15.5)
WBC: 10.1 10*3/uL (ref 4.0–10.5)

## 2013-10-04 NOTE — Care Management Utilization Note (Signed)
UR completed 

## 2013-10-04 NOTE — Progress Notes (Signed)
Patient sitting up in chair. Alert and oriented. No complaints voiced at this time.

## 2013-10-04 NOTE — Progress Notes (Signed)
[facility] PROGRESS NOTE  Lorne SkeensDevin L Alabi ZOX:[id number] DOB: 08/15/1970 DOA: 10/02/2013 PCP: Yolande JollyMelancon, Caleb G, MD  Assessment/Plan: Cellulitis of left leg: Improving today. Will continue IV vancomycin day #3.  Blood cultures with no growth to date. Max temp 98. He remains non-toxic. Of note, hx of same and has been on cleocin prn. States he "ran out" of meds this time.   Dyspnea: resolved.  Chest xray without active disease and stable cardiomegaly. No cough. Oxygen saturation level >90% on room air. monitor   Hypertension: fair controlled. Continue metoprolol 25 mg by mouth daily.   Leukocytosis: resolved. Likely related to #1. See #1 plan. Monitor   Thrombocytopenia: resolved. Likely reactive. Will monitor.   Nonischemic cardiomyopathy: chart review indicates last echo 7/15 yields EF 45% to 50%. Diffuse hypokinesis. Doppler parameters are consistent with abnormal left ventricular relaxation (grade 1 diastolic dysfunction). Remains compensated. Continue BB and lasix. Monitor intake and output. Weight stable.  Bilateral lymphedema. Hx of same. PT in past. He is wheelchair bound.   Obesity: BMI 52.6. Hx of weight loss. Nutritional consult.   Chronic lymphedema: provided with Lasix 40 mg by mouth daily. Patient has been taking this at home.    Code Status: full Family Communication: daughter at bedside Disposition Plan: home when ready likely 48 hours   Consultants:  none  Procedures:  none  Antibiotics: Vancomycin 10/02/13>>  HPI/Subjective: Up in chair. Reports left leg feeling better. Less pain  Objective: Filed Vitals:   10/04/13 0646  BP: 135/77  Pulse: 98  Temp: 98 F (36.7 C)  Resp: 20    Intake/Output Summary (Last 24 hours) at 10/04/13 1321 Last data filed at 10/04/13 1200  Gross per 24 hour  Intake    483 ml  Output   1200 ml  Net   -717 ml   Filed Weights   10/02/13 1758 10/02/13 2240 10/04/13 0700  Weight: 154.223 kg (340 lb) 170.552 kg  (376 lb) 168.693 kg (371 lb 14.4 oz)    Exam:   General:  Obese appears comfortable  Cardiovascular: RRR HS somewhat distant no MGR LE edema with L>R. Right with chronic venous stasis changes.  Respiratory: normal effort BS distant but clear no crackles no wheeze  Abdomen: obese soft +BS non-tender to palpation  Musculoskeletal: left leg with less erythema,less heat less tenderness. PPP   Data Reviewed: Basic Metabolic Panel:  Recent Labs Lab 10/02/13 1825 10/03/13 0432 10/04/13 0623  NA 135* 140 139  K 3.6* 3.8 4.0  CL 100 105 104  CO2 26 25 26   GLUCOSE 97 118* 98  BUN 11 10 6   CREATININE 1.13 0.99 0.96  CALCIUM 9.3 8.4 8.7   Liver Function Tests:  Recent Labs Lab 10/03/13 0432  AST 22  ALT 20  ALKPHOS 43  BILITOT 1.0  PROT 6.7  ALBUMIN 3.1*   No results found for this basename: LIPASE, AMYLASE,  in the last 168 hours No results found for this basename: AMMONIA,  in the last 168 hours CBC:  Recent Labs Lab 10/02/13 1825 10/03/13 0432 10/04/13 0623  WBC 22.1* 14.8* 10.1  NEUTROABS 20.2*  --   --   HGB 13.8 12.5* 12.5*  HCT 40.3 37.3* 38.0*  MCV 89.8 89.7 89.4  PLT 162 142* 153   Cardiac Enzymes: No results found for this basename: CKTOTAL, CKMB, CKMBINDEX, TROPONINI,  in the last 168 hours BNP (last 3 results) No results found for this basename: PROBNP,  in the last 8760 hours CBG:  No results found for this basename: GLUCAP,  in the last 168 hours  Recent Results (from the past 240 hour(s))  CULTURE, BLOOD (ROUTINE X 2)     Status: None   Collection Time    10/02/13  6:46 PM      Result Value Ref Range Status   Specimen Description RIGHT ANTECUBITAL   Final   Special Requests BOTTLES DRAWN AEROBIC ONLY 6CC   Final   Culture NO GROWTH 1 DAY   Final   Report Status PENDING   Incomplete  CULTURE, BLOOD (ROUTINE X 2)     Status: None   Collection Time    10/02/13  6:51 PM      Result Value Ref Range Status   Specimen Description BLOOD LEFT  HAND   Final   Special Requests BOTTLES DRAWN AEROBIC AND ANAEROBIC 10CC   Final   Culture NO GROWTH 1 DAY   Final   Report Status PENDING   Incomplete  URINE CULTURE     Status: None   Collection Time    10/02/13  8:07 PM      Result Value Ref Range Status   Specimen Description URINE, CLEAN CATCH   Final   Special Requests NONE   Final   Culture  Setup Time     Final   Value: 10/03/2013 13:17     Performed at [facility] Count PENDING   Incomplete   Culture     Final   Value: Culture reincubated for better growth     Performed at [facility]   Report Status PENDING   Incomplete     Studies: Dg Chest Port 1 View  10/03/2013   CLINICAL DATA:  Shortness of breath, crackles, chest pain  EXAM: PORTABLE CHEST - 1 VIEW  COMPARISON:  None.  FINDINGS: There is no focal parenchymal opacity, pleural effusion, or pneumothorax. There is stable cardiomegaly.  The osseous structures are unremarkable.  IMPRESSION: No active disease.   Electronically Signed   By: Elige Ko   On: 10/03/2013 13:14    Scheduled Meds: . aspirin EC  81 mg Oral Daily  . enoxaparin (LOVENOX) injection  80 mg Subcutaneous Q24H  . furosemide  40 mg Oral Daily  . metoprolol succinate  25 mg Oral Daily  . sodium chloride  3 mL Intravenous Q12H  . vancomycin  1,500 mg Intravenous Q12H   Continuous Infusions: . sodium chloride 50 mL/hr at 10/03/13 2157    Principal Problem:   Cellulitis Active Problems:   Bilateral leg edema   Obesity   Cellulitis and abscess of leg   History of spinal cord injury   Leukocytosis, unspecified   Nonischemic cardiomyopathy   Fever   SOB (shortness of breath)   Thrombocytopenia    Time spent: 35 minutes    [facility] M  [facility] Pager [phone]. If 7PM-7AM, please contact night-coverage at [email or web address], password [facility] 10/04/2013, 1:21 PM  LOS: 2 days

## 2013-10-04 NOTE — Evaluation (Signed)
Physical Therapy Evaluation Patient Details Name: Jordan Jensen MRN: [id number] DOB: 1970/03/15 Today's Date: 10/04/2013   History of Present Illness  43 year old male who  has a past medical history of Spinal injury (1993); Bilateral leg edema (2010); Essential hypertension, benign; Morbid obesity; Recurrent cellulitis of lower leg; and Post traumatic myelopathy.  He is admitted for cellulitis of the LLE.  Pt is morbidly obese and has been treated for lymphedema in the past.  He states that he has not been able to wear his lymphedema garments and his wrappings are worn out.  He lives with his wife and is normally independent with most ADLs (sometimes able to drive).  He can normally ambulate around his home with crutches and uses a w/c for mobility in the community.  Clinical Impression   Pt was seen for evaluation.  He was alert and oriented, very cooperative.  He has been out of the bed a time or two and able to get to the bathroom.  He is found to have increased weakness from baseline and his gait endurance is much more limited.  He has had OP PT combined with lymphedema therapy earlier in the year at [facility] and he states that he benefitted greatly from this.  We will follow him while in house to maximize his activity level.  If MD agrees, pt would be interested in participating in therapies at [facility] again as an outpatient.    Follow Up Recommendations Outpatient PT, lymphedema theapy outpatient   (at [facility])    Equipment Recommendations  None recommended by PT    Recommendations for Other Services   none    Precautions / Restrictions Precautions Precautions: Fall Restrictions Weight Bearing Restrictions: No      Mobility  Bed Mobility Overal bed mobility: Modified Independent             General bed mobility comments: HOB elevated and use of grab bars  Transfers Overall transfer level: Modified independent Equipment used: Crutches              General transfer comment: bed height elevated  Ambulation/Gait Ambulation/Gait assistance: Supervision Ambulation Distance (Feet): 12 Feet Assistive device: Crutches Gait Pattern/deviations: Decreased step length - right;Decreased dorsiflexion - right Gait velocity: very slow but as well as could be expected for his situation   General Gait Details: pt drags right LE for advancing, able to slightly lift left LE off of the floor...gait is very labored  Information systems manager [place] (Stroke Patients Only)       Balance Overall balance assessment: No apparent balance deficits (not formally assessed)                                           Pertinent Vitals/Pain Pain Assessment: No/denies pain    Home Living Family/patient expects to be discharged to:: Private residence Living Arrangements: Spouse/significant other Available Help at Discharge: Family;Available 24 hours/day Type of Home: House Home Access: Ramped entrance     Home Layout: One level Home Equipment: Shower seat;Crutches;Grab bars - toilet;Grab bars - tub/shower      Prior Function Level of Independence: Independent with assistive device(s)         Comments: pt is normally able to drive     Hand Dominance  Extremity/Trunk Assessment   Upper Extremity Assessment: Overall WFL for tasks assessed           Lower Extremity Assessment: RLE deficits/detail;LLE deficits/detail RLE Deficits / Details: moderate edemal present in leg, limited hip and knee flexion to 45 degrees....very difficult to determine strength due to large size of leg and severe weakness...he may have 1/5 strength on MMT but functionally he can maintain knee extension in stance LLE Deficits / Details: lymphedema through entire leg with strength 2/5, limited hip and knee flexion (knee about 30 degrees, hip to 45 degrees  Cervical / Trunk Assessment: Normal  Communication    Communication: No difficulties  Cognition Arousal/Alertness: Awake/alert Behavior During Therapy: WFL for tasks assessed/performed Overall Cognitive Status: Within Functional Limits for tasks assessed                      General Comments      Exercises General Exercises - Lower Extremity Ankle Circles/Pumps: PROM;AAROM;Right;Left;5 reps;Supine Heel Slides: PROM;AAROM;Both;10 reps;Supine Hip ABduction/ADduction: PROM;AAROM;Both;10 reps;Supine      Assessment/Plan    PT Assessment Patient needs continued PT services  PT Diagnosis Difficulty walking;Generalized weakness   PT Problem List Decreased strength;Decreased range of motion;Decreased activity tolerance;Decreased mobility;Obesity  PT Treatment Interventions Functional mobility training;Therapeutic exercise;Gait training   PT Goals (Current goals can be found in the Care Plan section) Acute Rehab PT Goals Patient Stated Goal: would like to feel more mobile PT Goal Formulation: With patient Time For Goal Achievement: 10/18/13 Potential to Achieve Goals: Good    Frequency Min 2X/week   Barriers to discharge   none    Co-evaluation               End of Session Equipment Utilized During Treatment: Gait belt Activity Tolerance: Patient tolerated treatment well Patient left: in chair;with call bell/phone within reach;with family/visitor present Nurse Communication: Mobility status         Time: 1115-1200 PT Time Calculation (min): 45 min   Charges:   PT Evaluation $Initial PT Evaluation Tier I: 1 Procedure     PT G CodesMyrlene Broker:          Tiegan Terpstra L 10/04/2013, 12:27 PM

## 2013-10-04 NOTE — Progress Notes (Signed)
Patient seen and examined. Note reviewed  He was admitted to the hospital with left lower extremity cellulitis. He has chronic lymphedema. Cellulitis of the left lower extremity appears to be improving. Continue current antibiotics. Leukocytosis also improved. Clinically, I feel he'll need another one to 2 days the hospital with intravenous antibiotics. Remainder of his medical issues are stable.  MEMON,JEHANZEB

## 2013-10-05 LAB — CBC
HEMATOCRIT: 38.5 % — AB (ref 39.0–52.0)
HEMOGLOBIN: 13.1 g/dL (ref 13.0–17.0)
MCH: 30.3 pg (ref 26.0–34.0)
MCHC: 34 g/dL (ref 30.0–36.0)
MCV: 89.1 fL (ref 78.0–100.0)
Platelets: 154 10*3/uL (ref 150–400)
RBC: 4.32 MIL/uL (ref 4.22–5.81)
RDW: 12.9 % (ref 11.5–15.5)
WBC: 8.7 10*3/uL (ref 4.0–10.5)

## 2013-10-05 LAB — VANCOMYCIN, TROUGH: VANCOMYCIN TR: 10.6 ug/mL (ref 10.0–20.0)

## 2013-10-05 MED ORDER — CLINDAMYCIN HCL 150 MG PO CAPS
300.0000 mg | ORAL_CAPSULE | Freq: Three times a day (TID) | ORAL | Status: DC
  Administered 2013-10-05 – 2013-10-06 (×3): 300 mg via ORAL
  Filled 2013-10-05 (×3): qty 2

## 2013-10-05 MED ORDER — SACCHAROMYCES BOULARDII 250 MG PO CAPS
250.0000 mg | ORAL_CAPSULE | Freq: Two times a day (BID) | ORAL | Status: DC
  Administered 2013-10-05 – 2013-10-06 (×3): 250 mg via ORAL
  Filled 2013-10-05 (×3): qty 1

## 2013-10-05 NOTE — Progress Notes (Signed)
Pt seen and examined. Agree with above note by Ms. Black, NP. Briefly, pt presents with LLE cellulitis in the setting of chronic lymphedema. Afebrile with leukocytosis resolved. Agree with transition to PO abx and monitor overnight. If stable, may consider d/c in the next 24hrs.

## 2013-10-05 NOTE — Care Management Note (Unsigned)
    Page 1 of 1   10/05/2013     5:12:55 PM CARE MANAGEMENT NOTE 10/05/2013  Patient:  Jordan Jensen   Account Number:  [id number]  Date Initiated:  10/05/2013  Documentation initiated by:  Anibal Henderson  Subjective/Objective Assessment:   patient admitted with cellulitis of left lower extremity. He has a partial paralysis of the lower extremities, but is still independent and drives. He plans to return home with family at discharge.     Action/Plan:   will follow for home health needs- at this point patient is planned for home with PO meds   Anticipated DC Date:  10/06/2013   Anticipated DC Plan:  HOME/SELF CARE      DC Planning Services  CM consult      Choice offered to / List presented to:             Status of service:  In process, will continue to follow Medicare Important Message given?   (If response is "NO", the following Medicare IM given date fields will be blank) Date Medicare IM given:   Medicare IM given by:   Date Additional Medicare IM given:   Additional Medicare IM given by:    Discharge Disposition:    Per UR Regulation:  Reviewed for med. necessity/level of care/duration of stay  If discussed at Long Length of Stay Meetings, dates discussed:    Comments:  10/05/13 1100  Anibal Henderson RN/CM  Patient states his biggest problem is getting on his shoes and socks. suggested he try [facility] or [facility] in [place] or other large medical supply house for aides in putting on shoes and socks, because there are some available. Patient is very appreciative of this suggestion, and states he will try this

## 2013-10-05 NOTE — Progress Notes (Signed)
[facility] PROGRESS NOTE  Jordan Jensen EXB:[id number] DOB: 05/29/1970 DOA: 10/02/2013 PCP: Yolande Jolly, MD  Assessment/Plan: Cellulitis of left leg: in patient with chronic bilateral lymphedema. continues to improve. Blood cultures with no growth to date. Max temp 98. He remains non-toxic. Will transition antibiotics to po cleocin. If remains stable on po antibiotics likely discharge tomorrow.  Of note, hx of same and has been on cleocin prn. States he "ran out" of meds this time.   Dyspnea: resolved. Chest xray without active disease and stable cardiomegaly. No cough. Oxygen saturation level >90% on room air. monitor   Hypertension: fair controlled. Continue metoprolol 25 mg by mouth daily.   Leukocytosis: resolved. Likely related to #1. See #1 plan. Monitor   Thrombocytopenia: resolved. Likely reactive. Will monitor.   Nonischemic cardiomyopathy: chart review indicates last echo 7/15 yields EF 45% to 50%. Diffuse hypokinesis. Doppler parameters are consistent with abnormal left ventricular relaxation (grade 1 diastolic dysfunction). Remains compensated. Continue BB and lasix. Monitor intake and output. Volume status -1.1L  Bilateral lymphedema. Hx of same. PT in past. He is wheelchair bound.   Obesity: BMI 52.6. Hx of weight loss. Nutritional consult.  Chronic lymphedema: continue Lasix 40 mg by mouth daily. Patient has been taking this at home.     Code Status: full Family Communication: none present Disposition Plan: home hopefully tomorrow   Consultants:  none  Procedures:  none  Antibiotics:  Vancomycin 10/02/13>>>10/05/13  Cleocin 10/05/13>>  HPI/Subjective: Up in chair. Reports feeling "much better"  Objective: Filed Vitals:   10/05/13 0953  BP: 101/59  Pulse: 82  Temp:   Resp:     Intake/Output Summary (Last 24 hours) at 10/05/13 1125 Last data filed at 10/05/13 0800  Gross per 24 hour  Intake    560 ml  Output   2050 ml  Net  -1490 ml    Filed Weights   10/02/13 1758 10/02/13 2240 10/04/13 0700  Weight: 154.223 kg (340 lb) 170.552 kg (376 lb) 168.693 kg (371 lb 14.4 oz)    Exam:   General:  Obese appears comfortable  Cardiovascular: RRR No MGR bilateral LE edema with L>R. Bilateral venous stasis changes. Left leg with very little erythema, no pain or heat.   Respiratory: normal effort BS clear bilaterally no wheeze  Abdomen: obese, non-distended non-tender to palpation  Musculoskeletal: no clubbing or cyanosis   Data Reviewed: Basic Metabolic Panel:  Recent Labs Lab 10/02/13 1825 10/03/13 0432 10/04/13 0623  NA 135* 140 139  K 3.6* 3.8 4.0  CL 100 105 104  CO2 26 25 26   GLUCOSE 97 118* 98  BUN 11 10 6   CREATININE 1.13 0.99 0.96  CALCIUM 9.3 8.4 8.7   Liver Function Tests:  Recent Labs Lab 10/03/13 0432  AST 22  ALT 20  ALKPHOS 43  BILITOT 1.0  PROT 6.7  ALBUMIN 3.1*   No results found for this basename: LIPASE, AMYLASE,  in the last 168 hours No results found for this basename: AMMONIA,  in the last 168 hours CBC:  Recent Labs Lab 10/02/13 1825 10/03/13 0432 10/04/13 0623 10/05/13 0807  WBC 22.1* 14.8* 10.1 8.7  NEUTROABS 20.2*  --   --   --   HGB 13.8 12.5* 12.5* 13.1  HCT 40.3 37.3* 38.0* 38.5*  MCV 89.8 89.7 89.4 89.1  PLT 162 142* 153 154   Cardiac Enzymes: No results found for this basename: CKTOTAL, CKMB, CKMBINDEX, TROPONINI,  in the last 168 hours BNP (last  3 results) No results found for this basename: PROBNP,  in the last 8760 hours CBG: No results found for this basename: GLUCAP,  in the last 168 hours  Recent Results (from the past 240 hour(s))  CULTURE, BLOOD (ROUTINE X 2)     Status: None   Collection Time    10/02/13  6:46 PM      Result Value Ref Range Status   Specimen Description BLOOD RIGHT ANTECUBITAL   Final   Special Requests BOTTLES DRAWN AEROBIC ONLY 6CC   Final   Culture NO GROWTH 2 DAYS   Final   Report Status PENDING   Incomplete  CULTURE,  BLOOD (ROUTINE X 2)     Status: None   Collection Time    10/02/13  6:51 PM      Result Value Ref Range Status   Specimen Description BLOOD LEFT HAND   Final   Special Requests BOTTLES DRAWN AEROBIC AND ANAEROBIC 10CC   Final   Culture NO GROWTH 2 DAYS   Final   Report Status PENDING   Incomplete  URINE CULTURE     Status: None   Collection Time    10/02/13  8:07 PM      Result Value Ref Range Status   Specimen Description URINE, CLEAN CATCH   Final   Special Requests NONE   Final   Culture  Setup Time     Final   Value: 10/03/2013 13:17     Performed at [facility]   Colony Count     Final   Value: 55,000 COLONIES/ML     Performed at [facility]   Culture     Final   Value: ENTEROCOCCUS SPECIES     Performed at [facility]   Report Status PENDING   Incomplete     Studies: Dg Chest Port 1 View  10/03/2013   CLINICAL DATA:  Shortness of breath, crackles, chest pain  EXAM: PORTABLE CHEST - 1 VIEW  COMPARISON:  None.  FINDINGS: There is no focal parenchymal opacity, pleural effusion, or pneumothorax. There is stable cardiomegaly.  The osseous structures are unremarkable.  IMPRESSION: No active disease.   Electronically Signed   By: Elige KoHetal  Patel   On: 10/03/2013 13:14    Scheduled Meds: . aspirin EC  81 mg Oral Daily  . clindamycin  300 mg Oral 3 times per day  . enoxaparin (LOVENOX) injection  80 mg Subcutaneous Q24H  . furosemide  40 mg Oral Daily  . metoprolol succinate  25 mg Oral Daily  . saccharomyces boulardii  250 mg Oral BID  . sodium chloride  3 mL Intravenous Q12H   Continuous Infusions:   Principal Problem:   Cellulitis Active Problems:   Bilateral leg edema   Obesity   Cellulitis and abscess of leg   History of spinal cord injury   Leukocytosis, unspecified   Nonischemic cardiomyopathy   Fever   SOB (shortness of breath)   Thrombocytopenia    Time spent: 35 mintutes    [facility] HospitalBLACK,Christian Treadway M  [facility] Pager [phone].  If 7PM-7AM, please contact night-coverage at [email or web address], password [email or web address] 10/05/2013, 11:25 AM  LOS: 3 days

## 2013-10-05 NOTE — Progress Notes (Addendum)
Physical Therapy Treatment Patient Details Name: Jordan Jensen MRN: [id number] DOB: Apr 07, 1970 Today's Date: 10/05/2013 [id number]  38' TE         Patient transferred to recliner with MI and use of crutches to complete therex. Lateral hip muscles weak, pt not even able to manually resisted hip abduction. He did complete all other exercises. Added seated crunches to assist with better bed sitting balance as pt stated this was difficult.                             Mobility  Bed Mobility Overal bed mobility: Modified Independent                Transfers Overall transfer level: Independent Equipment used: Human resources officer transfer comment: bed height elevated                                                                                               Exercises General Exercises - Lower Extremity Ankle Circles/Pumps: AROM;Both;10 reps;Seated Long Arc Quad: AROM;Both;Seated;10 reps Heel Slides: AROM;Both;10 reps;Seated (with towel) Hip ABduction/ADduction:  (attempted;pt unable even to complete isometric) Hip Flexion/Marching: AROM;Both;10 reps;Seated Other Exercises Other Exercises: transfer x2 bed<>recliner;MI with use of crutches and small steps Other Exercises: seated crunches with arms crossed at chest x10    General Comments        Pertinent Vitals/Pain      Home Living                      Prior Function            PT Goals (current goals can now be found in the care plan section) Progress towards PT goals: Progressing toward goals    Frequency       PT Plan      Co-evaluation             End of Session           Time: 8110-3159 PT Time Calculation (min): 19 min  Charges:  $Therapeutic Exercise: 8-22 mins                    G Codes:      Stony Stegmann ATKINSO 10/05/2013, 2:23 PM

## 2013-10-05 NOTE — Plan of Care (Signed)
Problem: Phase I Progression Outcomes Goal: OOB as tolerated unless otherwise ordered Outcome: Completed/Met Date Met:  10/05/13 Patient got up to chair from bed and returned to bed with standby assistance and use of crutches. Patient tolerated transfer well. Needed assistance to raise legs from floor to bed.   Problem: Phase II Progression Outcomes Goal: Wound without signs/symptoms of infection, decreasing edema Outcome: Completed/Met Date Met:  10/05/13 Cellulitis on left lower extremity is improving--decreased redness and heat.
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
## 2013-10-06 DIAGNOSIS — L03116 Cellulitis of left lower limb: Principal | ICD-10-CM

## 2013-10-06 DIAGNOSIS — R509 Fever, unspecified: Secondary | ICD-10-CM

## 2013-10-06 LAB — URINE CULTURE: Colony Count: 55000

## 2013-10-06 LAB — CBC
HEMATOCRIT: 40.2 % (ref 39.0–52.0)
HEMOGLOBIN: 13.5 g/dL (ref 13.0–17.0)
MCH: 29.9 pg (ref 26.0–34.0)
MCHC: 33.6 g/dL (ref 30.0–36.0)
MCV: 88.9 fL (ref 78.0–100.0)
Platelets: 174 10*3/uL (ref 150–400)
RBC: 4.52 MIL/uL (ref 4.22–5.81)
RDW: 12.9 % (ref 11.5–15.5)
WBC: 8.1 10*3/uL (ref 4.0–10.5)

## 2013-10-06 MED ORDER — SACCHAROMYCES BOULARDII 250 MG PO CAPS: 250.0000 mg | ORAL_CAPSULE | Freq: Two times a day (BID) | ORAL | Status: DC

## 2013-10-06 MED ORDER — CLINDAMYCIN HCL 300 MG PO CAPS: 300.0000 mg | ORAL_CAPSULE | Freq: Three times a day (TID) | ORAL | Status: DC
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
## 2013-10-06 NOTE — Discharge Summary (Signed)
Pt seen and examined. Agree with above discharge summary by Ms. Black, NP. Briefly, pt presented with LLE cellulitis improved with IV abx and ultimately continued to improve with PO abx on discharge. The patient remained medically stable for outpatient follow up.

## 2013-10-06 NOTE — Discharge Instructions (Signed)

## 2013-10-06 NOTE — Progress Notes (Signed)
NURSING PROGRESS NOTE  Jordan Jensen [id number] Discharge Data: 10/06/2013 12:19 PM Attending Provider: No att. providers found OYD:XAJOINOM, Hillery Hunter, MD   [name] to be D/C'd Home per MD order.    All IV's discontinued and monitored for bleeding.  All belongings returned to patient for patient to take home.  AVS summary and prescriptions reviewed with patient. Patient verbalized understanding.  Patient left floor via wheelchair, escorted by two student nurses.  Last Documented Vital Signs:  Blood pressure 116/73, pulse 80, temperature 98.1 F (36.7 C), temperature source Oral, resp. rate 20, height 5\' 11"  (1.803 m), weight 166.47 kg (367 lb), SpO2 92.00%.  Jordan Jensen D

## 2013-10-09 LAB — CULTURE, BLOOD (ROUTINE X 2)
Culture: NO GROWTH
Culture: NO GROWTH

## 2013-10-24 ENCOUNTER — Ambulatory Visit (INDEPENDENT_AMBULATORY_CARE_PROVIDER_SITE_OTHER): Payer: Medicare Other | Admitting: Family Medicine

## 2013-10-24 VITALS — BP 159/88 | HR 73 | Temp 98.6°F | Ht 71.0 in | Wt 362.0 lb

## 2013-10-24 DIAGNOSIS — R600 Localized edema: Secondary | ICD-10-CM | POA: Diagnosis not present

## 2013-10-24 DIAGNOSIS — E669 Obesity, unspecified: Secondary | ICD-10-CM

## 2013-10-24 NOTE — Patient Instructions (Signed)
Thanks for coming in today!   I'm glad that you are doing much better after your recent hospitalization.   Let's keep trying to get you in to the lymphedema therapy center at [facility]. Call them and see if they can touch base with our clinic here in order to get things squared away for you.   Continue to try to adhere to your diet and exercising when you can at the [facility].   Stay motivated, and keep enjoying life. You're doing great.   Thanks for letting us take care of you.   God Bless,   Devota Pace, MD Family Medicine - PGY 1

## 2013-11-02 DIAGNOSIS — Z7982 Long term (current) use of aspirin: Secondary | ICD-10-CM | POA: Diagnosis not present

## 2013-11-02 DIAGNOSIS — R350 Frequency of micturition: Secondary | ICD-10-CM | POA: Diagnosis not present

## 2013-11-02 DIAGNOSIS — N390 Urinary tract infection, site not specified: Secondary | ICD-10-CM | POA: Diagnosis not present

## 2013-11-02 DIAGNOSIS — Z96641 Presence of right artificial hip joint: Secondary | ICD-10-CM | POA: Diagnosis not present

## 2013-11-02 DIAGNOSIS — Z79899 Other long term (current) drug therapy: Secondary | ICD-10-CM | POA: Diagnosis not present

## 2013-11-02 DIAGNOSIS — Z87442 Personal history of urinary calculi: Secondary | ICD-10-CM | POA: Diagnosis not present

## 2013-11-04 ENCOUNTER — Encounter: Payer: Self-pay | Admitting: Family Medicine

## 2013-11-04 NOTE — Progress Notes (Signed)
Patient ID: Jordan Jensen, male   DOB: [birth year], 43 y.o.   MRN: [id number]   [facility] Yolande Jolly, MD Phone: [phone]  Subjective:   # Hospital Follow Up For Left Lower Extremity Cellulitis. - Pt. Recently hospitalized for LLE cellulitis x 1 week.  - Treated with IV antibiotics and Clindamycin now much improved with no further symptoms.  - History of venous insufficiency, now with compression stockings in place.  - Pt. Reports improvement in fluid status and lower extremity edema symptoms.   # Morbid Obesity  - Pt. Continues to have difficulty losing weight due to difficulty controlling diet because his mother cooks for him and he is often left with restricted choices.  - He is very motivated as in our previous office visit.  - He would like to begin physical therapy as an outpatient in conjunction with lymphedema therapy as a bridge to exercise and weight loss.  - Expresses strong desire to lose weight.   All relevant systems were reviewed and were negative unless otherwise noted in the HPI  Past Medical History Reviewed problem list.  Medications- reviewed and updated Current Outpatient Prescriptions  Medication Sig Dispense Refill  . aspirin EC 81 MG EC tablet Take 1 tablet (81 mg total) by mouth daily.  30 tablet  1  . clindamycin (CLEOCIN) 300 MG capsule Take 1 capsule (300 mg total) by mouth every 8 (eight) hours.  30 capsule  1  . famotidine (PEPCID) 20 MG tablet Take 1 tablet (20 mg total) by mouth daily.  30 tablet  1  . furosemide (LASIX) 20 MG tablet Take 2 tablets (40 mg total) by mouth daily.  30 tablet  6  . ibuprofen (ADVIL,MOTRIN) 200 MG tablet Take 200 mg by mouth every 6 (six) hours as needed for mild pain.      . metoprolol succinate (TOPROL-XL) 25 MG 24 hr tablet Take 1 tablet (25 mg total) by mouth daily.  30 tablet  1  . saccharomyces boulardii (FLORASTOR) 250 MG capsule Take 1 capsule (250 mg total) by mouth 2 (two) times daily.   20 capsule  0   No current facility-administered medications for this visit.   Chief complaint-noted No additions to family history Social history- patient is a  nonsmoker  Objective: BP 159/88  Pulse 73  Temp(Src) 98.6 F (37 C) (Oral)  Ht 5\' 11"  (1.803 m)  Wt 164.202 kg (362 lb)  BMI 50.51 kg/m2 Gen: NAD, alert, cooperative with exam, morbidly obese HEENT: NCAT, EOMI, PERRL, Neck: FROM, supple CV: RRR, good S1/S2, no murmur, gallops, or rubs. No TTP.  Resp: CTABL, no wheezes, non-labored, rate appropriate. No crackles.  Abd: SNTND, BS present, no guarding or organomegaly Ext: Diffuse lower extremity edema to the knee bilaterally, compression stockings in place, no evidence of cellulitis, or open lesions. Skin changes consistent with chronic lymphedema. Extremities warm, normal tone, moves UE/LE spontaneously Neuro: Alert and oriented, No gross deficits Skin: skin changes of lower extremities as above.   Assessment/Plan: See problem based a/p

## 2013-11-04 NOTE — Assessment & Plan Note (Signed)
Chronic lower extremity lymphedema.   - Pt. Continues to wear compression stockings - continue Lasix - Trying to get in for PT / Lymphedema therapy.  - Pt. Would like to start exercising in order to improve venous return and overall physical health.

## 2013-11-04 NOTE — Assessment & Plan Note (Signed)
Followed up after hospital discharge.   - No evidence of cellulitis at this time.  - Working to improve venous return of lower extremities in order to prevent future episodes of cellulitis.  - Continue compression stockings - Lymphedema therapy as previously prescribed.

## 2013-11-04 NOTE — Assessment & Plan Note (Signed)
>>  ASSESSMENT AND PLAN FOR BILATERAL LEG EDEMA WRITTEN ON 11/04/2013 12:13 PM BY MELANCON, CALEB G, MD  Chronic lower extremity lymphedema.   - Pt. Continues to wear compression stockings - continue Lasix - Trying to get in for PT / Lymphedema therapy.  - Pt. Would like to start exercising in order to improve venous return and overall physical health.

## 2013-11-04 NOTE — Assessment & Plan Note (Signed)
Morbid Obesity with history of excellent weight loss in 2014.   - Would like to continue working toward goal of weight loss.  - Will work on diet to improve overall intake, though he is somewhat limited due to not being able to control the content of his foods since his mother cooks / provides food for him.  - Will use PT / Lymphedema therapy as a bridge to exercise for weight loss.  - Currently going to the [facility] multiple times weekly in order to attempt to lose weight.  - Doing mostly weight lifting exercise.  - Will reassess at next visit.

## 2013-12-16 DIAGNOSIS — M7989 Other specified soft tissue disorders: Secondary | ICD-10-CM | POA: Diagnosis not present

## 2013-12-16 DIAGNOSIS — M79605 Pain in left leg: Secondary | ICD-10-CM | POA: Diagnosis not present

## 2013-12-16 DIAGNOSIS — Z9889 Other specified postprocedural states: Secondary | ICD-10-CM | POA: Diagnosis not present

## 2013-12-16 DIAGNOSIS — Z8781 Personal history of (healed) traumatic fracture: Secondary | ICD-10-CM | POA: Diagnosis not present

## 2013-12-16 DIAGNOSIS — R112 Nausea with vomiting, unspecified: Secondary | ICD-10-CM | POA: Diagnosis not present

## 2013-12-16 DIAGNOSIS — N390 Urinary tract infection, site not specified: Secondary | ICD-10-CM | POA: Diagnosis not present

## 2013-12-16 DIAGNOSIS — M545 Low back pain: Secondary | ICD-10-CM | POA: Diagnosis not present

## 2013-12-16 DIAGNOSIS — E669 Obesity, unspecified: Secondary | ICD-10-CM | POA: Diagnosis not present

## 2013-12-16 DIAGNOSIS — Q820 Hereditary lymphedema: Secondary | ICD-10-CM | POA: Diagnosis not present

## 2013-12-16 DIAGNOSIS — I10 Essential (primary) hypertension: Secondary | ICD-10-CM | POA: Diagnosis not present

## 2013-12-16 DIAGNOSIS — Z87442 Personal history of urinary calculi: Secondary | ICD-10-CM | POA: Diagnosis not present

## 2013-12-16 DIAGNOSIS — Z96641 Presence of right artificial hip joint: Secondary | ICD-10-CM | POA: Diagnosis not present

## 2013-12-16 DIAGNOSIS — R7989 Other specified abnormal findings of blood chemistry: Secondary | ICD-10-CM | POA: Diagnosis not present

## 2013-12-16 DIAGNOSIS — L03116 Cellulitis of left lower limb: Secondary | ICD-10-CM | POA: Diagnosis not present

## 2013-12-16 DIAGNOSIS — K4090 Unilateral inguinal hernia, without obstruction or gangrene, not specified as recurrent: Secondary | ICD-10-CM | POA: Diagnosis not present

## 2013-12-16 DIAGNOSIS — R509 Fever, unspecified: Secondary | ICD-10-CM | POA: Diagnosis not present

## 2013-12-17 DIAGNOSIS — K4090 Unilateral inguinal hernia, without obstruction or gangrene, not specified as recurrent: Secondary | ICD-10-CM | POA: Diagnosis not present

## 2013-12-23 DIAGNOSIS — M7989 Other specified soft tissue disorders: Secondary | ICD-10-CM | POA: Diagnosis not present

## 2014-01-25 ENCOUNTER — Emergency Department (HOSPITAL_COMMUNITY)
Admission: EM | Admit: 2014-01-25 | Discharge: 2014-01-25 | Disposition: A | Discharge status: Home / Self Care | Payer: Medicare Other | Attending: Emergency Medicine | Admitting: Emergency Medicine

## 2014-01-25 ENCOUNTER — Emergency Department (HOSPITAL_COMMUNITY): Payer: Medicare Other

## 2014-01-25 ENCOUNTER — Encounter (HOSPITAL_COMMUNITY): Payer: Self-pay | Admitting: *Deleted

## 2014-01-25 DIAGNOSIS — Z87891 Personal history of nicotine dependence: Secondary | ICD-10-CM | POA: Diagnosis not present

## 2014-01-25 DIAGNOSIS — Z79899 Other long term (current) drug therapy: Secondary | ICD-10-CM | POA: Insufficient documentation

## 2014-01-25 DIAGNOSIS — J40 Bronchitis, not specified as acute or chronic: Secondary | ICD-10-CM

## 2014-01-25 DIAGNOSIS — I10 Essential (primary) hypertension: Secondary | ICD-10-CM | POA: Insufficient documentation

## 2014-01-25 DIAGNOSIS — J209 Acute bronchitis, unspecified: Secondary | ICD-10-CM | POA: Insufficient documentation

## 2014-01-25 DIAGNOSIS — R05 Cough: Secondary | ICD-10-CM

## 2014-01-25 DIAGNOSIS — Z7982 Long term (current) use of aspirin: Secondary | ICD-10-CM | POA: Insufficient documentation

## 2014-01-25 DIAGNOSIS — Z87828 Personal history of other (healed) physical injury and trauma: Secondary | ICD-10-CM | POA: Insufficient documentation

## 2014-01-25 DIAGNOSIS — R059 Cough, unspecified: Secondary | ICD-10-CM

## 2014-01-25 DIAGNOSIS — Z872 Personal history of diseases of the skin and subcutaneous tissue: Secondary | ICD-10-CM | POA: Diagnosis not present

## 2014-01-25 MED ORDER — AZITHROMYCIN 250 MG PO TABS: 250.0000 mg | ORAL_TABLET | Freq: Every day | ORAL | Status: DC

## 2014-01-25 MED ORDER — PREDNISONE 20 MG PO TABS
40.0000 mg | ORAL_TABLET | Freq: Once | ORAL | Status: AC
  Administered 2014-01-25: 40 mg via ORAL

## 2014-01-25 MED ORDER — PREDNISONE 20 MG PO TABS
ORAL_TABLET | ORAL | Status: AC
  Filled 2014-01-25: qty 2

## 2014-01-25 MED ORDER — PREDNISONE 10 MG PO TABS: 20.0000 mg | ORAL_TABLET | Freq: Two times a day (BID) | ORAL | Status: DC

## 2014-01-25 MED ORDER — AZITHROMYCIN 250 MG PO TABS
ORAL_TABLET | ORAL | Status: AC
  Filled 2014-01-25: qty 2

## 2014-01-25 MED ORDER — AZITHROMYCIN 250 MG PO TABS
500.0000 mg | ORAL_TABLET | Freq: Once | ORAL | Status: AC
  Administered 2014-01-25: 500 mg via ORAL

## 2014-01-25 NOTE — ED Notes (Signed)
Productive cough, green in color. Pt states Robitussin is no longer working. States he needs something for the cough. Symptoms began last Thursday
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
## 2014-01-25 NOTE — Discharge Instructions (Signed)
Continue to take the over the counter cough medication with the medication we give you today. Follow up with your doctor or return here if symptoms worsen.

## 2014-02-02 ENCOUNTER — Encounter (HOSPITAL_COMMUNITY): Payer: Self-pay

## 2014-02-02 ENCOUNTER — Inpatient Hospital Stay (HOSPITAL_COMMUNITY)
Admission: EM | Admit: 2014-02-02 | Discharge: 2014-02-05 | DRG: 603 | Disposition: A | Discharge status: Home Health | Payer: Medicare Other | Attending: Family Medicine | Admitting: Family Medicine

## 2014-02-02 DIAGNOSIS — I890 Lymphedema, not elsewhere classified: Secondary | ICD-10-CM

## 2014-02-02 DIAGNOSIS — I509 Heart failure, unspecified: Secondary | ICD-10-CM | POA: Diagnosis present

## 2014-02-02 DIAGNOSIS — Z7982 Long term (current) use of aspirin: Secondary | ICD-10-CM | POA: Diagnosis not present

## 2014-02-02 DIAGNOSIS — I428 Other cardiomyopathies: Secondary | ICD-10-CM | POA: Diagnosis present

## 2014-02-02 DIAGNOSIS — E669 Obesity, unspecified: Secondary | ICD-10-CM

## 2014-02-02 DIAGNOSIS — E875 Hyperkalemia: Secondary | ICD-10-CM | POA: Diagnosis not present

## 2014-02-02 DIAGNOSIS — L03119 Cellulitis of unspecified part of limb: Secondary | ICD-10-CM | POA: Diagnosis not present

## 2014-02-02 DIAGNOSIS — M79604 Pain in right leg: Secondary | ICD-10-CM | POA: Diagnosis not present

## 2014-02-02 DIAGNOSIS — J40 Bronchitis, not specified as acute or chronic: Secondary | ICD-10-CM | POA: Diagnosis present

## 2014-02-02 DIAGNOSIS — L03115 Cellulitis of right lower limb: Secondary | ICD-10-CM | POA: Diagnosis present

## 2014-02-02 DIAGNOSIS — I429 Cardiomyopathy, unspecified: Secondary | ICD-10-CM | POA: Diagnosis not present

## 2014-02-02 DIAGNOSIS — R609 Edema, unspecified: Secondary | ICD-10-CM | POA: Diagnosis not present

## 2014-02-02 DIAGNOSIS — Z993 Dependence on wheelchair: Secondary | ICD-10-CM | POA: Diagnosis not present

## 2014-02-02 DIAGNOSIS — R059 Cough, unspecified: Secondary | ICD-10-CM

## 2014-02-02 DIAGNOSIS — G9589 Other specified diseases of spinal cord: Secondary | ICD-10-CM | POA: Diagnosis present

## 2014-02-02 DIAGNOSIS — Z79899 Other long term (current) drug therapy: Secondary | ICD-10-CM | POA: Diagnosis not present

## 2014-02-02 DIAGNOSIS — R600 Localized edema: Secondary | ICD-10-CM

## 2014-02-02 DIAGNOSIS — I10 Essential (primary) hypertension: Secondary | ICD-10-CM | POA: Diagnosis present

## 2014-02-02 DIAGNOSIS — Z6841 Body Mass Index (BMI) 40.0 and over, adult: Secondary | ICD-10-CM | POA: Diagnosis not present

## 2014-02-02 DIAGNOSIS — Z981 Arthrodesis status: Secondary | ICD-10-CM | POA: Diagnosis not present

## 2014-02-02 DIAGNOSIS — R05 Cough: Secondary | ICD-10-CM | POA: Diagnosis not present

## 2014-02-02 DIAGNOSIS — Z96649 Presence of unspecified artificial hip joint: Secondary | ICD-10-CM | POA: Diagnosis present

## 2014-02-02 DIAGNOSIS — Z8669 Personal history of other diseases of the nervous system and sense organs: Secondary | ICD-10-CM | POA: Diagnosis not present

## 2014-02-02 DIAGNOSIS — Z87891 Personal history of nicotine dependence: Secondary | ICD-10-CM

## 2014-02-02 HISTORY — DX: Lymphedema, not elsewhere classified: I89.0

## 2014-02-02 HISTORY — DX: Heart failure, unspecified: I50.9

## 2014-02-02 HISTORY — DX: Dependence on wheelchair: Z99.3

## 2014-02-02 LAB — BASIC METABOLIC PANEL
Anion gap: 4 — ABNORMAL LOW (ref 5–15)
BUN: 12 mg/dL (ref 6–23)
CO2: 30 mmol/L (ref 19–32)
CREATININE: 1.03 mg/dL (ref 0.50–1.35)
Calcium: 9 mg/dL (ref 8.4–10.5)
Chloride: 104 mmol/L (ref 96–112)
GFR calc Af Amer: >90 mL/min (ref >90)
GFR, EST NON AFRICAN AMERICAN: 87 mL/min — AB (ref >90)
GLUCOSE: 99 mg/dL (ref 70–99)
POTASSIUM: 4.2 mmol/L (ref 3.5–5.1)
SODIUM: 138 mmol/L (ref 135–145)

## 2014-02-02 LAB — CBC WITH DIFFERENTIAL/PLATELET
BASOS PCT: 0 % (ref 0–1)
Basophils Absolute: 0 10*3/uL (ref 0.0–0.1)
EOS PCT: 2 % (ref 0–5)
Eosinophils Absolute: 0.3 10*3/uL (ref 0.0–0.7)
HEMATOCRIT: 41.6 % (ref 39.0–52.0)
HEMOGLOBIN: 13.6 g/dL (ref 13.0–17.0)
LYMPHS PCT: 19 % (ref 12–46)
Lymphs Abs: 2.7 10*3/uL (ref 0.7–4.0)
MCH: 30.2 pg (ref 26.0–34.0)
MCHC: 32.7 g/dL (ref 30.0–36.0)
MCV: 92.4 fL (ref 78.0–100.0)
MONOS PCT: 6 % (ref 3–12)
Monocytes Absolute: 0.8 10*3/uL (ref 0.1–1.0)
NEUTROS ABS: 10.4 10*3/uL — AB (ref 1.7–7.7)
Neutrophils Relative %: 73 % (ref 43–77)
Platelets: 196 10*3/uL (ref 150–400)
RBC: 4.5 MIL/uL (ref 4.22–5.81)
RDW: 13.3 % (ref 11.5–15.5)
WBC: 14.2 10*3/uL — ABNORMAL HIGH (ref 4.0–10.5)

## 2014-02-02 MED ORDER — SODIUM CHLORIDE 0.9 % IV SOLN
INTRAVENOUS | Status: DC
  Administered 2014-02-02: 23:00:00 via INTRAVENOUS

## 2014-02-02 MED ORDER — CLINDAMYCIN PHOSPHATE 600 MG/50ML IV SOLN
600.0000 mg | Freq: Once | INTRAVENOUS | Status: AC
  Administered 2014-02-02: 600 mg via INTRAVENOUS
  Filled 2014-02-02: qty 50
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
## 2014-02-02 NOTE — ED Notes (Signed)
MD at bedside. 

## 2014-02-02 NOTE — ED Notes (Signed)
Patient states bilateral legs swelling and "sore to touch"
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
## 2014-02-03 DIAGNOSIS — L03119 Cellulitis of unspecified part of limb: Secondary | ICD-10-CM | POA: Diagnosis present

## 2014-02-03 DIAGNOSIS — I429 Cardiomyopathy, unspecified: Secondary | ICD-10-CM

## 2014-02-03 DIAGNOSIS — R600 Localized edema: Secondary | ICD-10-CM

## 2014-02-03 DIAGNOSIS — L03115 Cellulitis of right lower limb: Principal | ICD-10-CM

## 2014-02-03 DIAGNOSIS — E669 Obesity, unspecified: Secondary | ICD-10-CM

## 2014-02-03 DIAGNOSIS — Z8669 Personal history of other diseases of the nervous system and sense organs: Secondary | ICD-10-CM

## 2014-02-03 LAB — BASIC METABOLIC PANEL
Anion gap: 6 (ref 5–15)
BUN: 9 mg/dL (ref 6–23)
CALCIUM: 8.7 mg/dL (ref 8.4–10.5)
CO2: 28 mmol/L (ref 19–32)
Chloride: 106 mmol/L (ref 96–112)
Creatinine, Ser: 1.07 mg/dL (ref 0.50–1.35)
GFR calc Af Amer: >90 mL/min (ref >90)
GFR, EST NON AFRICAN AMERICAN: 83 mL/min — AB (ref >90)
GLUCOSE: 102 mg/dL — AB (ref 70–99)
Potassium: 4.1 mmol/L (ref 3.5–5.1)
Sodium: 140 mmol/L (ref 135–145)

## 2014-02-03 LAB — CBC
HEMATOCRIT: 38.8 % — AB (ref 39.0–52.0)
Hemoglobin: 12.4 g/dL — ABNORMAL LOW (ref 13.0–17.0)
MCH: 29.4 pg (ref 26.0–34.0)
MCHC: 32 g/dL (ref 30.0–36.0)
MCV: 91.9 fL (ref 78.0–100.0)
PLATELETS: 178 10*3/uL (ref 150–400)
RBC: 4.22 MIL/uL (ref 4.22–5.81)
RDW: 13.4 % (ref 11.5–15.5)
WBC: 11.6 10*3/uL — ABNORMAL HIGH (ref 4.0–10.5)

## 2014-02-03 LAB — BRAIN NATRIURETIC PEPTIDE: B Natriuretic Peptide: 18.8 pg/mL (ref 0.0–100.0)

## 2014-02-03 MED ORDER — ENOXAPARIN SODIUM 80 MG/0.8ML ~~LOC~~ SOLN
80.0000 mg | SUBCUTANEOUS | Status: DC
  Administered 2014-02-03 – 2014-02-05 (×3): 80 mg via SUBCUTANEOUS
  Filled 2014-02-03 (×3): qty 0.8

## 2014-02-03 MED ORDER — GUAIFENESIN 100 MG/5ML PO SYRP
200.0000 mg | ORAL_SOLUTION | ORAL | Status: DC | PRN
Start: 2014-02-03 — End: 2014-02-05
  Administered 2014-02-03 – 2014-02-04 (×2): 200 mg via ORAL
  Filled 2014-02-03 (×4): qty 10

## 2014-02-03 MED ORDER — METOPROLOL SUCCINATE 12.5 MG HALF TABLET
12.5000 mg | ORAL_TABLET | Freq: Every day | ORAL | Status: DC
  Administered 2014-02-03 – 2014-02-05 (×3): 12.5 mg via ORAL
  Filled 2014-02-03 (×3): qty 1

## 2014-02-03 MED ORDER — MENTHOL 3 MG MT LOZG
1.0000 | LOZENGE | OROMUCOSAL | Status: DC | PRN
  Administered 2014-02-03 – 2014-02-04 (×2): 3 mg via ORAL
  Filled 2014-02-03 (×5): qty 9

## 2014-02-03 MED ORDER — SODIUM CHLORIDE 0.9 % IV SOLN: INTRAVENOUS | Status: DC

## 2014-02-03 MED ORDER — IBUPROFEN 200 MG PO TABS: 200.0000 mg | ORAL_TABLET | Freq: Four times a day (QID) | ORAL | Status: DC | PRN

## 2014-02-03 MED ORDER — OXYCODONE HCL 5 MG PO TABS: 5.0000 mg | ORAL_TABLET | ORAL | Status: DC | PRN

## 2014-02-03 MED ORDER — ASPIRIN EC 81 MG PO TBEC
81.0000 mg | DELAYED_RELEASE_TABLET | Freq: Every day | ORAL | Status: DC
  Administered 2014-02-03 – 2014-02-05 (×3): 81 mg via ORAL
  Filled 2014-02-03 (×3): qty 1

## 2014-02-03 MED ORDER — FUROSEMIDE 40 MG PO TABS
40.0000 mg | ORAL_TABLET | Freq: Two times a day (BID) | ORAL | Status: DC
Start: 2014-02-03 — End: 2014-02-05
  Administered 2014-02-03 – 2014-02-04 (×3): 40 mg via ORAL
  Filled 2014-02-03 (×6): qty 1

## 2014-02-03 MED ORDER — SENNOSIDES-DOCUSATE SODIUM 8.6-50 MG PO TABS: 1.0000 | ORAL_TABLET | Freq: Every evening | ORAL | Status: DC | PRN

## 2014-02-03 MED ORDER — ACETAMINOPHEN 325 MG PO TABS: 650.0000 mg | ORAL_TABLET | Freq: Four times a day (QID) | ORAL | Status: DC | PRN

## 2014-02-03 MED ORDER — CLINDAMYCIN PHOSPHATE 600 MG/50ML IV SOLN
600.0000 mg | Freq: Three times a day (TID) | INTRAVENOUS | Status: DC
  Administered 2014-02-03 (×3): 600 mg via INTRAVENOUS
  Filled 2014-02-03 (×5): qty 50

## 2014-02-03 MED ORDER — ACETAMINOPHEN 650 MG RE SUPP: 650.0000 mg | Freq: Four times a day (QID) | RECTAL | Status: DC | PRN

## 2014-02-03 MED ORDER — FUROSEMIDE 40 MG PO TABS
40.0000 mg | ORAL_TABLET | Freq: Every day | ORAL | Status: DC
  Filled 2014-02-03: qty 1

## 2014-02-03 MED ORDER — FAMOTIDINE 20 MG PO TABS
20.0000 mg | ORAL_TABLET | Freq: Every day | ORAL | Status: DC
  Administered 2014-02-03 – 2014-02-05 (×3): 20 mg via ORAL
  Filled 2014-02-03 (×3): qty 1

## 2014-02-03 NOTE — Evaluation (Signed)
Physical Therapy Evaluation Patient Details Name: Jordan Jensen MRN: [id number] DOB: 1970/12/14 Today's Date: 02/03/2014   History of Present Illness  Pt. is 44 year old male admitted 02/02/14 with recurrent cellulitis R LE.  Pt. with extensive history inculding morbid obesity, h/o C6-7 injury, B LE lymphadema, essentila HTN, post traumatic myelopathy, CHF and wheelchair dependence in the community  Clinical Impression  Pt. [presents with a decrease in his usual level of function due to R LE cellulitis and increased edema superimposed on lymphadema of both LEs.  Pt. Will benefit from acute PT to progress his mobility and allow for safe return to prior functional level.  Pt. Has several lymphadema management devices at home.  He has requested that his mom bring up his "Jordan Jensen" to be used while in house.  PT at outpatient has a lymphadema program but we are not able to intervene inhouse.  He will use his home devices while here.  If there is a need , we can refer him to OPPT for lymphadema management.  Hopefully he will just be able to continue on with what he already has at his disposal.      Follow Up Recommendations No PT follow up;Supervision/Assistance - 24 hour (as long as pt. progresses adequately).       Equipment Recommendations  None recommended by PT    Recommendations for Other Services       Precautions / Restrictions Precautions Precautions: Fall      Mobility  Bed Mobility Overal bed mobility: Needs Assistance Bed Mobility: Supine to Sit     Supine to sit: Mod assist     General bed mobility comments: mod assist to move LEs to EOB; pt. then able to use UEs and bed rail to sit himself up at EOB  Transfers Overall transfer level: Needs assistance Equipment used: Rolling walker (2 wheeled) Transfers: Sit to/from UGI Jensen Sit to Stand: Min assist Stand pivot transfers: Min assist       General transfer comment: Pt. able to complete under his  own power but min assist for safety in transfers (3 pivoting stpes)  Ambulation/Gait Ambulation/Gait assistance: Min assist Ambulation Distance (Feet): 3 Feet Assistive device: Rolling walker (2 wheeled) Gait Pattern/deviations: Step-to pattern;Decreased weight shift to right;Decreased weight shift to left;Decreased step length - right;Decreased step length - left;Decreased dorsiflexion - right     General Gait Details: Pt. able to take 3 truning stpes from bed to recliner, needed increased time to complete due to body habitus; min assist for safety and stability  Stairs            Wheelchair Mobility    Modified Rankin (Stroke Patients Only)       Balance Overall balance assessment: Needs assistance Sitting-balance support: Single extremity supported;Feet supported Sitting balance-Leahy Scale: Good     Standing balance support: Bilateral upper extremity supported;During functional activity Standing balance-Leahy Scale: Poor (due to need for RW) Standing balance comment: pt. able to stand steady with support from RW statically                             Pertinent Vitals/Pain Pain Assessment: 0-10 Pain Score: 0-No pain    Home Living Family/patient expects to be discharged to:: Private residence Living Arrangements: Children (2 daughters, ages 45 and 80, working and in school) Available Help at Discharge: Family;Available 24 hours/day (pt's mother could be available if needed; daughters in/out) Type of Home:  House Home Access: Ramped entrance     Home Layout: One level Home Equipment: Shower seat;Crutches;Grab bars - toilet;Grab bars - tub/shower;Wheelchair - manual Additional Comments: compression stockings knee high for lymphadema management at home; also reports he has "Jordan Jensen" for compression as well as electric pumps .  Pt. has reqeusted his Mom bring his crutches and his "Jordan Jensen" up to hospital to help in management of edema.  They will  arrive later today.      Prior Function Level of Independence: Independent with assistive device(s) (crutches indoors; w/c in community;)         Comments: pt is normally able to drive     Hand Dominance   Dominant Hand: Right    Extremity/Trunk Assessment   Upper Extremity Assessment: RUE deficits/detail;Generalized weakness;LUE deficits/detail RUE Deficits / Details: pt. with right hand weakness from old injury; generalized weakness throughout     LUE Deficits / Details: generalized weakness   Lower Extremity Assessment: Generalized weakness;RLE deficits/detail RLE Deficits / Details: pt. reports he "drags right foot" when walking; generalized weakness noted throughout.  Limitations in ROM ankle, knee and hip due to morbid obesity/lymphadema    Cervical / Trunk Assessment: Normal  Communication   Communication: No difficulties  Cognition Arousal/Alertness: Awake/alert Behavior During Therapy: WFL for tasks assessed/performed Overall Cognitive Status: Within Functional Limits for tasks assessed                      General Comments General comments (skin integrity, edema, etc.): B LEs with lymphademea ; new additional edema in legs due to cellulitis per pt.    Exercises General Exercises - Lower Extremity Ankle Circles/Pumps: AROM;Both;10 reps;Supine Quad Sets: AROM;Both;10 reps;Seated      Assessment/Plan    PT Assessment Patient needs continued PT services  PT Diagnosis Difficulty walking;Abnormality of gait;Generalized weakness;Acute pain   PT Problem List Decreased strength;Decreased range of motion  PT Treatment Interventions DME instruction;Gait training;Functional mobility training;Therapeutic activities;Therapeutic exercise;Balance training;Patient/family education   PT Goals (Current goals can be found in the Care Plan section) Acute Rehab PT Goals Patient Stated Goal: keep swelling down while in hospital and mobilize PT Goal Formulation: With  patient Time For Goal Achievement: 02/10/14 Potential to Achieve Goals: Good    Frequency Min 5X/week   Barriers to discharge        Co-evaluation               End of Session Equipment Utilized During Treatment: Gait belt Activity Tolerance: Patient tolerated treatment well Patient left: in chair;with call bell/phone within reach;Other (comment) (bariatric recliner) Nurse Communication: Mobility status;Precautions (need for +2 for maximum safety)         Time: 1610-9604 PT Time Calculation (min) (ACUTE ONLY): 44 min   Charges:   PT Evaluation $Initial PT Evaluation Tier I: 1 Procedure PT Treatments $Therapeutic Exercise: 8-22 mins $Therapeutic Activity: 8-22 mins   PT G Codes:        Ferman Hamming 02/03/2014, 4:08 PM Weldon Picking PT Acute Rehab Services [phone] Beeper [phone]

## 2014-02-03 NOTE — Plan of Care (Signed)
Problem: Food- and Nutrition-Related Knowledge Deficit (NB-1.1) Goal: Nutrition education Formal process to instruct or train a patient/client in a skill or to impart knowledge to help patients/clients voluntarily manage or modify food choices and eating behavior to maintain or improve health. Outcome: Completed/Met Date Met:  02/03/14  RD consulted for nutrition education regarding weight loss.  Body mass index is 48.7 kg/(m^2). Pt meets criteria for morbidly obese based on current BMI.  Patient has attempted to lose weight in the past and has succeeded at losing ~10-20 pounds. He claims he knows what is healthy and what is not, but he has trouble maintaining a more healthy diet without reverting to his unhealthy eating habits.   RD provided "My plate"  and "Weight Loss Tips" (From the Academy of Nutrition and Dietetics)  Handouts. Provided examples of ways to balance meals/snacks and encouraged intake of high-fiber and protein rich foods. Talked about preparing meals in advance and adding more structure to his eating schedule. Emphasized the importance of  limiting sugar-sweetened beverages and cut back excessive snacking. Encouraged pt to follow up with outpatient RD. Teach back method used.  Expect good compliance.  Current diet order is Heart Healthy. time. Labs and medications reviewed. No further nutrition interventions warranted at this time. RD contact information provided. If additional nutrition issues arise, please re-consult RD.  Burtis Junes RD, LDN Nutrition [phone] 02/03/2014 1:24 PM
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
## 2014-02-03 NOTE — Progress Notes (Signed)
UR Completed.  [phone]  

## 2014-02-04 DIAGNOSIS — R609 Edema, unspecified: Secondary | ICD-10-CM

## 2014-02-04 LAB — CBC
HCT: 39.5 % (ref 39.0–52.0)
Hemoglobin: 12.9 g/dL — ABNORMAL LOW (ref 13.0–17.0)
MCH: 29.5 pg (ref 26.0–34.0)
MCHC: 32.7 g/dL (ref 30.0–36.0)
MCV: 90.4 fL (ref 78.0–100.0)
Platelets: 188 10*3/uL (ref 150–400)
RBC: 4.37 MIL/uL (ref 4.22–5.81)
RDW: 13.4 % (ref 11.5–15.5)
WBC: 9.9 10*3/uL (ref 4.0–10.5)

## 2014-02-04 LAB — BASIC METABOLIC PANEL
ANION GAP: 3 — AB (ref 5–15)
BUN: 10 mg/dL (ref 6–23)
CALCIUM: 9 mg/dL (ref 8.4–10.5)
CO2: 33 mmol/L — ABNORMAL HIGH (ref 19–32)
Chloride: 98 mmol/L (ref 96–112)
Creatinine, Ser: 1.09 mg/dL (ref 0.50–1.35)
GFR calc Af Amer: >90 mL/min (ref >90)
GFR calc non Af Amer: 81 mL/min — ABNORMAL LOW (ref >90)
Glucose, Bld: 92 mg/dL (ref 70–99)
Potassium: 3.9 mmol/L (ref 3.5–5.1)
SODIUM: 134 mmol/L — AB (ref 135–145)

## 2014-02-04 MED ORDER — IPRATROPIUM-ALBUTEROL 0.5-2.5 (3) MG/3ML IN SOLN
3.0000 mL | Freq: Four times a day (QID) | RESPIRATORY_TRACT | Status: DC
  Administered 2014-02-04 – 2014-02-05 (×3): 3 mL via RESPIRATORY_TRACT
  Filled 2014-02-04 (×3): qty 3

## 2014-02-04 MED ORDER — IPRATROPIUM-ALBUTEROL 0.5-2.5 (3) MG/3ML IN SOLN: 3.0000 mL | RESPIRATORY_TRACT | Status: DC | PRN

## 2014-02-04 MED ORDER — CLINDAMYCIN HCL 300 MG PO CAPS
300.0000 mg | ORAL_CAPSULE | Freq: Four times a day (QID) | ORAL | Status: DC
  Administered 2014-02-04 – 2014-02-05 (×7): 300 mg via ORAL
  Filled 2014-02-04 (×10): qty 1
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
## 2014-02-04 NOTE — Clinical Social Work Note (Signed)
CSW received consult for SNF. CSW reviewed patient's chart and PT recommendation for HHPT 24hr/supervision assistance. CSW met with patient and introduced self. CSW discussed supervision/assistance with patient. Per patient, he is mostly home during the day by himself. Patient states both daughters live with him, but one is in school and works a full time job and the second daughter works full time as well and is about to have a Sport and exercise psychologist. Patient sates he will have the support of his mother Joycelyn Schmid) who lived with him until 2 months ago and provided caregiver services for him. Per patient, mother will move back in with him to provide the supervision/assistance he needs. Patient states he does not have any stairs at home to climb. No further needs identified at this time. Please re-consult CSW service if needs arise. CSW signing off.  [place], [place] Weekend Clinical Social Worker [phone]

## 2014-02-04 NOTE — Progress Notes (Signed)
VASCULAR LAB PRELIMINARY  PRELIMINARY  PRELIMINARY  PRELIMINARY  Bilateral lower extremity venous Dopplers completed.    Preliminary report:  There is no obvious evidence of DVT or SVT noted in the bilateral lower extremities.   Avelino Herren, RVT 02/04/2014, 4:10 PM

## 2014-02-04 NOTE — Progress Notes (Signed)
Family Medicine Teaching Service Daily Progress Note Intern Pager: [phone]  Patient name: Jordan Jensen Medical record number: [id number] Date of birth: September 12, 1970 Age: 44 y.o. Gender: male  Primary Care Provider: Melancon, Hillery Hunter, MD Consultants: None Code Status: Full  Assessment and Plan: Jordan Jensen is a 44 y.o. male presenting with right lower extremity cellulitis. PMH is significant for recurrent lower leg cellulitis, wheelchair dependence after post-traumatic myelopathy / spinal injury, lymphedema, CHF, HTN, morbid obesity, NICM.  # Recurrent LE cellulitis-  - Afebrile - WBC noted to be 14.2 at admission. Improved to 9.9 today - IV Clindamycin (1/28>>1/30) - Oral Clindamycin (1/30>>) (Day #3) - Follow up LE doppler - PT consult for lymphedema therapy --can't provide as inpatient, but will follow as outpatient - Tylenol and ibuprofen PRN mild pain, Oxycodone PRN moderate-severe pain - Elevate lower extremities, Place TED hose if tolerated  # CHF- No rales noted on exam. No shortness of breath, but does admit to cough. Significant edema in lower extremities. - Lasix 40mg  PO BID - Monitor effectiveness and creatinine. Consider discontinuing lasix if appropriate.  - Creatinine stable at 1.09. Will continue to monitor  - Decreased edema noted. Will continue with BID dosing for now - Daily standing weights. 349 at admission, 355 today  # Bronchitis- Diagnosed last week. Continues to note cough, however it is no longer productive. - Guaifenesin - Cepacol Lozenge  # HTN- Home medications include Metoprolol succinate 25mg . Blood pressure 149/60 at admission - Continue home medications, however initiating Metoprolol at 12.5mg  (as pt stated he was not at all taking metoprolol due to not knowing he should continue it).  # Morbid obesity- In the past, very motivated to lose weight per PCP notes. Has easily lost 10-20 lbs in the past but regained easily as well. - Consult  dietitian  FEN/GI: Heart Diet; senokot-S Prophylaxis: Lovenox  Disposition: Admitted to [facility] Medicine Teaching Service, [facility] attending. Discharge pending continued improvement on oral antibiotics.  Subjective:  No acute complaints overnight. Notes improvement of edema. Believes Lasix is helping with the swelling. Notes mild erythema. Pain has resolved in left extremity and is very mild in right extremity. Continues to note cough due to bronchitis. No further complaints today.  Objective: Temp:  [98.3 F (36.8 C)-98.5 F (36.9 C)] 98.3 F (36.8 C) (01/30 0413) Pulse Rate:  [79-87] 84 (01/30 0413) Resp:  [18-20] 18 (01/30 0413) BP: (106-122)/(64-66) 106/66 mmHg (01/30 0413) SpO2:  [95 %-97 %] 97 % (01/30 0413) Weight:  [355 lb 2.6 oz (161.1 kg)] 355 lb 2.6 oz (161.1 kg) (01/30 0413) Physical Exam: General: 44yo male resting comfortably in his chair in no apparent distress Cardiovascular: S1 and S2 noted. No murmurs/rubs/gallops. Regular rate and rhythm. Respiratory: Clear to auscultation bilaterally. No wheezes/rales/rhonchi. No increased work of breathing. Abdomen: Bowel sounds noted. Soft and nondistended. Nontender to palpation. Extremities: 3+ pitting edema to lower extremities bilaterally, improved since last exam. Mild erythema of right lower extremity improved since last exam. Compression boots placed at end of exam and present prior to leaving room  Laboratory:  Recent Labs Lab 02/02/14 2154 02/03/14 0755 02/04/14 0552  WBC 14.2* 11.6* 9.9  HGB 13.6 12.4* 12.9*  HCT 41.6 38.8* 39.5  PLT 196 178 188    Recent Labs Lab 02/02/14 2154 02/03/14 0755  NA 138 140  K 4.2 4.1  CL 104 106  CO2 30 28  BUN 12 9  CREATININE 1.03 1.07  CALCIUM 9.0 8.7  GLUCOSE 99 102*  -  BNP 18.8  Imaging/Diagnostic Tests: No results found.  [address] [place], [place] 02/04/2014, 7:27 AM PGY-1, [place] [facility] FPTS Intern pager: [phone], text pages welcome

## 2014-02-05 ENCOUNTER — Inpatient Hospital Stay (HOSPITAL_COMMUNITY): Payer: Medicare Other

## 2014-02-05 LAB — CBC
HCT: 39.5 % (ref 39.0–52.0)
Hemoglobin: 13.2 g/dL (ref 13.0–17.0)
MCH: 30 pg (ref 26.0–34.0)
MCHC: 33.4 g/dL (ref 30.0–36.0)
MCV: 89.8 fL (ref 78.0–100.0)
PLATELETS: 212 10*3/uL (ref 150–400)
RBC: 4.4 MIL/uL (ref 4.22–5.81)
RDW: 13.2 % (ref 11.5–15.5)
WBC: 11.3 10*3/uL — AB (ref 4.0–10.5)

## 2014-02-05 LAB — BASIC METABOLIC PANEL
ANION GAP: 9 (ref 5–15)
BUN: 10 mg/dL (ref 6–23)
CHLORIDE: 98 mmol/L (ref 96–112)
CO2: 28 mmol/L (ref 19–32)
CREATININE: 1.25 mg/dL (ref 0.50–1.35)
Calcium: 8.9 mg/dL (ref 8.4–10.5)
GFR calc Af Amer: 79 mL/min — ABNORMAL LOW (ref >90)
GFR, EST NON AFRICAN AMERICAN: 69 mL/min — AB (ref >90)
GLUCOSE: 113 mg/dL — AB (ref 70–99)
Potassium: 5.5 mmol/L — ABNORMAL HIGH (ref 3.5–5.1)
SODIUM: 135 mmol/L (ref 135–145)

## 2014-02-05 LAB — POTASSIUM: Potassium: 4.1 mmol/L (ref 3.5–5.1)

## 2014-02-05 MED ORDER — FUROSEMIDE 40 MG PO TABS: 40.0000 mg | ORAL_TABLET | Freq: Every day | ORAL | Status: DC

## 2014-02-05 MED ORDER — FUROSEMIDE 20 MG PO TABS: 40.0000 mg | ORAL_TABLET | Freq: Every day | ORAL | Status: DC

## 2014-02-05 MED ORDER — CLINDAMYCIN HCL 300 MG PO CAPS: 300.0000 mg | ORAL_CAPSULE | Freq: Four times a day (QID) | ORAL | Status: AC

## 2014-02-05 MED ORDER — FUROSEMIDE 40 MG PO TABS
40.0000 mg | ORAL_TABLET | Freq: Every day | ORAL | Status: DC
  Administered 2014-02-05: 40 mg via ORAL
  Filled 2014-02-05: qty 1

## 2014-02-05 MED ORDER — METOPROLOL SUCCINATE ER 25 MG PO TB24: 12.5000 mg | ORAL_TABLET | Freq: Every day | ORAL | Status: DC

## 2014-02-05 MED ORDER — GUAIFENESIN 100 MG/5ML PO SYRP: 200.0000 mg | ORAL_SOLUTION | ORAL | Status: DC | PRN

## 2014-02-05 NOTE — Discharge Summary (Signed)
Family Medicine Teaching [facility] Discharge Summary  Patient name: Jordan Jensen Medical record number: [id number] Date of birth: 05-18-1970 Age: 44 y.o. Gender: male Date of Admission: 02/02/2014  Date of Discharge: 02/05/14 Admitting Physician: Nestor Ramp, MD  Primary Care Provider: Yolande Jolly, MD Consultants: None  Indication for Hospitalization: Right Lower Extremity Cellulitis  Discharge Diagnoses/Problem List:  Cellulitis Bronchitis Lymphedema CHF HTN Obesity  Disposition: Discharge Home  Discharge Condition: Stable  Brief Hospital Course:  Mr. Kostura is a 44yo male who presented to the emergency department at [facility] on 02/02/14 with bilateral leg and feet swelling and tenderness with erythema of his right leg for three days and was ultimately diagnosed with right leg cellulitis. He was transferred to [facility] so his primary medicine team could care for him. He was afebrile throughout hospitalization. IV clindamycin was initiated on 1/28 and continued until transition to oral clindamycin was initiated on 1/30. Lower extremity doppler was normal. Physical Therapy and Occupational Therapy was consulted and recommended outpatient lymphedema therapy. Lasix 40mg  PO BID initiated on 1/29 and continued for two days with improvement in edema. Creatinine increased from 1.09 to 1.25, so lasix was discontinued on 1/31.  Mr. Stoia was also diagnosed with Bronchitis the week prior to presentation. Guaifenesin and Cepacol lozenges were used throughout hospitalization to control his cough. CXR on 1/31 showed no acute cardiopulmonary disease.  Prior to hospitalization, Mr. Huger did not know he was prescribed Metoprolol 25mg . Initiated medication at 12.5mg . Discharged on 12.5mg .  Discharged on 1/31 following improvement in edema and erythema of right leg on oral antibiotics.  Issues for Follow Up:  1. Follow up edema and erythema. Much improved at discharge. At  baseline, left leg is larger than right. Discharged on home lasix 40mg  daily. Can return to PRN dosing if edema improved. 2. Discharged on Oral Clindamycin. On day # 5 of 10 at discharge. End date 02/11/14. 3. Establish outpatient lymphedema therapy 4. Follow up cough. Discharged with guaifenesin. 5. Follow up Creatinine. Discontinue lasix if continues to worsen. Creatinine 1.25 at discharge. 6. Follow up blood pressure. Can increase dose of Metoprolol to 25mg  if indicated. 7. Continue to counsel on weight loss. Very interested in improving his diet.  Significant Procedures: None  Significant Labs and Imaging:   Recent Labs Lab 02/03/14 0755 02/04/14 0552 02/05/14 0450  WBC 11.6* 9.9 11.3*  HGB 12.4* 12.9* 13.2  HCT 38.8* 39.5 39.5  PLT 178 188 212    Recent Labs Lab 02/02/14 2154 02/03/14 0755 02/04/14 0552 02/05/14 0450 02/05/14 0930  NA 138 140 134* 135  --   K 4.2 4.1 3.9 5.5* 4.1  CL 104 106 98 98  --   CO2 30 28 33* 28  --   GLUCOSE 99 102* 92 113*  --   BUN 12 9 10 10   --   CREATININE 1.03 1.07 1.09 1.25  --   CALCIUM 9.0 8.7 9.0 8.9  --   - BNP 18.8  Dg Chest 2 View  02/05/2014   CLINICAL DATA:  Cough.  No chest pain.  History CHF.  EXAM: CHEST  2 VIEW  COMPARISON:  01/25/2014  FINDINGS: Somewhat limited exam due to low lung volumes and the semi-erect positioning.  Cardiac silhouette is top-normal in size. No mediastinal or hilar masses.  No convincing lung consolidation or edema. No pleural effusion or pneumothorax.  Bony thorax is intact. Previous cervical spine anterior fusion noted, stable.  IMPRESSION: No convincing  acute cardiopulmonary disease.   Electronically Signed   By: Amie Portland M.D.   On: 02/05/2014 14:00   Results/Tests Pending at Time of Discharge: None  Discharge Medications:    Medication List    STOP taking these medications        azithromycin 250 MG tablet  Commonly known as:  ZITHROMAX     saccharomyces boulardii 250 MG capsule   Commonly known as:  FLORASTOR      TAKE these medications        aspirin 81 MG EC tablet  Take 1 tablet (81 mg total) by mouth daily.     clindamycin 300 MG capsule  Commonly known as:  CLEOCIN  Take 1 capsule (300 mg total) by mouth every 6 (six) hours.     famotidine 20 MG tablet  Commonly known as:  PEPCID  Take 1 tablet (20 mg total) by mouth daily.     furosemide 20 MG tablet  Commonly known as:  LASIX  Take 2 tablets (40 mg total) by mouth daily.     guaifenesin 100 MG/5ML syrup  Commonly known as:  ROBITUSSIN  Take 10 mLs (200 mg total) by mouth every 4 (four) hours as needed for congestion.     ibuprofen 200 MG tablet  Commonly known as:  ADVIL,MOTRIN  Take 200 mg by mouth every 6 (six) hours as needed for mild pain.     metoprolol succinate 25 MG 24 hr tablet  Commonly known as:  TOPROL-XL  Take 0.5 tablets (12.5 mg total) by mouth daily.     predniSONE 10 MG tablet  Commonly known as:  DELTASONE  Take 2 tablets (20 mg total) by mouth 2 (two) times daily with a meal.        Discharge Instructions: Please refer to Patient Instructions section of EMR for full details.  Patient was counseled important signs and symptoms that should prompt return to medical care, changes in medications, dietary instructions, activity restrictions, and follow up appointments.   Follow-Up Appointments:     Follow-up Information    Follow up with Baker Janus, DO On 02/07/2014.   Specialty:  Family Medicine   Why:  :30pm for Hospital F/U   Contact information:   [address] [place] [address] [phone]       Araceli Bouche, DO 02/06/2014, 8:57 PM PGY-1, [facility]

## 2014-02-05 NOTE — Progress Notes (Signed)
Family Medicine Teaching Service Daily Progress Note Intern Pager: [phone]  Patient name: Jordan Jensen Medical record number: [id number] Date of birth: 04/22/1970 Age: 44 y.o. Gender: male  Primary Care Provider: Melancon, Hillery Hunter, MD Consultants: None Code Status: Full  Assessment and Plan: 44 y.o. male presenting with right lower extremity cellulitis. PMH is significant for recurrent lower leg cellulitis, wheelchair dependence after post-traumatic myelopathy / spinal injury, lymphedema, CHF, HTN, morbid obesity, NICM.  # Recurrent LE cellulitis-  - Afebrile - WBC noted to be 14.2 at admission. 11.3 today up from 9.9 yesterday. - IV Clindamycin (1/28>>1/30) - Oral Clindamycin (1/30>>) (Day #4) - LE doppler- normal - PT consult for lymphedema therapy --can't provide as inpatient, but will follow as outpatient - Tylenol and ibuprofen PRN mild pain, Oxycodone PRN moderate-severe pain - Elevate lower extremities, Place TED hose if tolerated  # Hyperkalemia - Potassium 5.5 this morning. Suspected to be secondary to hemolysis - Improved to 4.1 on repeat this morning. Will not replete at this time. - Follow up repeat potassium  # CHF- No rales noted on exam. No shortness of breath, but does admit to cough. Significant edema in lower extremities. - Lasix  PO BID - Monitor effectiveness and creatinine. Consider discontinuing lasix if appropriate.  - Creatinine 1.25, up from 1.09. Will discontinue BID dosing of lasix - Daily standing weights. 349 at admission, 355 today  # Bronchitis- Diagnosed last week. Continues to note cough, however it is no longer productive. - Will obtain CXR today due to worsening WBC and coughing - Guaifenesin - Cepacol Lozenge  # HTN- Home medications include Metoprolol succinate . Blood pressure 149/60 at admission - Continue home medications, however initiating Metoprolol at 12.5mg  (as pt stated he was not at all taking metoprolol due to not  knowing he should continue it).  # Morbid obesity- In the past, very motivated to lose weight per PCP notes. Has easily lost 10-20 lbs in the past but regained easily as well. - Consult dietitian  FEN/GI: Heart Diet; senokot-S Prophylaxis: Lovenox  Disposition: Admitted to [facility] Medicine Teaching Service, [facility] attending. Discharge pending continued improvement on oral antibiotics.  Subjective:  No acute complaints overnight. Continues to note improvement in swelling and erythema. States swelling is not quite back at baseline. Per nursing, had severe episode of coughing last night. No further concerns.  Objective: Temp:  [98.3 F (36.8 C)-98.7 F (37.1 C)] 98.7 F (37.1 C) (01/31 0423) Pulse Rate:  [78-81] 78 (01/31 0423) Resp:  [18] 18 (01/31 0423) BP: (93-114)/(58-63) 108/61 mmHg (01/31 0423) SpO2:  [93 %-96 %] 95 % (01/31 0423) Weight:  [361 lb 5.3 oz (163.9 kg)] 361 lb 5.3 oz (163.9 kg) (01/31 0500) Physical Exam: General: 44yo male resting comfortably in his chair in no apparent distress Cardiovascular: S1 and S2 noted. No murmurs/rubs/gallops. Regular rate and rhythm. Respiratory: Clear to auscultation bilaterally. No wheezes/rales/rhonchi. No increased work of breathing. Abdomen: Bowel sounds noted. Soft and nondistended. Nontender to palpation. Extremities: 3+ pitting edema to lower extremities bilaterally, improved since last exam. Mild erythema of right lower extremity improved since last exam.   Laboratory:  Recent Labs Lab 02/03/14 0755 02/04/14 0552 02/05/14 0450  WBC 11.6* 9.9 11.3*  HGB 12.4* 12.9* 13.2  HCT 38.8* 39.5 39.5  PLT 178 188 212    Recent Labs Lab 02/03/14 0755 02/04/14 0552 02/05/14 0450  NA 140 134* 135  K 4.1 3.9 5.5*  CL 106 98 98  CO2 28 33* 28  BUN 9 10 10   CREATININE 1.07 1.09 1.25  CALCIUM 8.7 9.0 8.9  GLUCOSE 102* 92 113*  - BNP 18.8  Imaging/Diagnostic Tests: No results found.  [address] [place], [place] 02/05/2014, 9:06  AM PGY-1, [facility] FPTS Intern pager: [phone], text pages welcome

## 2014-02-05 NOTE — Discharge Instructions (Signed)

## 2014-02-06 NOTE — Progress Notes (Signed)
02/06/14 HHPT and HHOT ordered. Contacted patient at his home to discuss HHC.He selected [facility]. He states that  he has all needed equipment. Contacted Miranda at [facility] and set up HHPT and HHOT.

## 2014-02-07 ENCOUNTER — Encounter: Payer: Self-pay | Admitting: Obstetrics and Gynecology

## 2014-02-07 ENCOUNTER — Ambulatory Visit (INDEPENDENT_AMBULATORY_CARE_PROVIDER_SITE_OTHER): Payer: Medicare Other | Admitting: Obstetrics and Gynecology

## 2014-02-07 VITALS — BP 124/84 | HR 87 | Temp 97.9°F | Wt 366.0 lb

## 2014-02-07 DIAGNOSIS — L03119 Cellulitis of unspecified part of limb: Secondary | ICD-10-CM | POA: Diagnosis not present

## 2014-02-07 DIAGNOSIS — S14106S Unspecified injury at C6 level of cervical spinal cord, sequela: Secondary | ICD-10-CM | POA: Diagnosis not present

## 2014-02-07 DIAGNOSIS — I509 Heart failure, unspecified: Secondary | ICD-10-CM | POA: Diagnosis not present

## 2014-02-07 DIAGNOSIS — I429 Cardiomyopathy, unspecified: Secondary | ICD-10-CM | POA: Diagnosis not present

## 2014-02-07 DIAGNOSIS — J441 Chronic obstructive pulmonary disease with (acute) exacerbation: Secondary | ICD-10-CM | POA: Diagnosis not present

## 2014-02-07 DIAGNOSIS — I890 Lymphedema, not elsewhere classified: Secondary | ICD-10-CM | POA: Diagnosis not present

## 2014-02-07 DIAGNOSIS — L03115 Cellulitis of right lower limb: Secondary | ICD-10-CM | POA: Diagnosis not present

## 2014-02-07 DIAGNOSIS — R269 Unspecified abnormalities of gait and mobility: Secondary | ICD-10-CM | POA: Diagnosis not present

## 2014-02-07 DIAGNOSIS — R600 Localized edema: Secondary | ICD-10-CM

## 2014-02-07 DIAGNOSIS — E669 Obesity, unspecified: Secondary | ICD-10-CM | POA: Diagnosis not present

## 2014-02-07 DIAGNOSIS — S14107S Unspecified injury at C7 level of cervical spinal cord, sequela: Secondary | ICD-10-CM | POA: Diagnosis not present

## 2014-02-07 MED ORDER — IBUPROFEN 200 MG PO TABS: 200.0000 mg | ORAL_TABLET | Freq: Four times a day (QID) | ORAL | Status: DC | PRN

## 2014-02-07 NOTE — Assessment & Plan Note (Signed)
Patient with recurrent cellulitis. He was encouraged to continue Abx for length of treatment (last day 02/11/2014). Erythema resolving. Currently with no symptoms. Should continued to be monitored. Patient advised on good skin care to avoid infection.

## 2014-02-07 NOTE — Patient Instructions (Signed)
Mr. Westover it was great to meet you today.  I am pleased to hear that things are going well for you and that you are improving since your hospitalization.   Here are some of the things we discussed today: -Please schedule an appointment to return to clinic in about 3 weeks so that your labs can be redrawn and we can reevaluate need for Lasix.  -Continue antibiotics as prescribed.  -Continue to do compression stockings.  -Refilled ibuprofen for shoulder pain  Please schedule a follow-up appointment for 3 weeks.   Thanks for allowing me to be a part of your care! Dr. Doroteo Glassman

## 2014-02-07 NOTE — Assessment & Plan Note (Addendum)
>>  ASSESSMENT AND PLAN FOR LYMPHEDEMA WRITTEN ON 02/07/2014  8:27 PM BY Dasiah Hooley Y, DO  Patient wheelchair dependent. He is to continue compression stockings, leg elevation, and home PT. Primary reason for LE edema. Patient encouraged to continue weight loss journey. NSAID refilled for prn pain.   >>ASSESSMENT AND PLAN FOR BILATERAL LEG EDEMA WRITTEN ON 02/07/2014  8:30 PM BY Josetta Wigal Y, DO  A: L>R. Edema most likely due to lymphedema and not CHF. P: Continue Lasix at current dose. Will need to readdress this at next clinic visit as this may not be a good longterm treatment for patient. Lymphedema is not treated with diuretics. Patient to return to clinic 2 weeks for lab draw to check Cr and K. Did not repeat labs as he was just discharged from hospital. At next visit re-access need for Lasix. Do not think this will help his LE edema much.

## 2014-02-07 NOTE — Progress Notes (Signed)
     Subjective: Chief Complaint  Patient presents with  . Hospitalization Follow-up    HPI: Jordan Jensen is a 44 y.o. presenting to clinic today to discuss the following:  #Hospital follow-up for cellulitis and edema: Discharged from hospital on Sunday. Patient states that since leaving the hospital his swelling is still improving. He has been wearing his compression stockings which help as well with swelling. He is still taking his antibiotics. Patient says he is taking lasix 78m daily and this is new since admission. Prior to admission he only took Lasix prn for swelling. He also states he is not on any potassium.   #BP: Patient with normotensive pressures. Today 124/84. He is taking metoprolol 12.545mdaily.  Objective: BP 124/84 mmHg  Pulse 87  Temp(Src) 97.9 F (36.6 C) (Oral)  Wt 366 lb (166.017 kg)  Physical Exam   Results for orders placed or performed during the hospital encounter of 02/02/14 (from the past 72 hour(s))  Basic metabolic panel     Status: Abnormal   Collection Time: 02/05/14  4:50 AM  Result Value Ref Range   Sodium 135 135 - 145 mmol/L   Potassium 5.5 (H) 3.5 - 5.1 mmol/L    Comment: DELTA CHECK NOTED HEMOLYSIS AT THIS LEVEL MAY AFFECT RESULT    Chloride 98 96 - 112 mmol/L   CO2 28 19 - 32 mmol/L   Glucose, Bld 113 (H) 70 - 99 mg/dL   BUN 10 6 - 23 mg/dL   Creatinine, Ser 1.25 0.50 - 1.35 mg/dL   Calcium 8.9 8.4 - 10.5 mg/dL   GFR calc non Af Amer 69 (L) >90 mL/min   GFR calc Af Amer 79 (L) >90 mL/min    Comment: (NOTE) The eGFR has been calculated using the CKD EPI equation. This calculation has not been validated in all clinical situations. eGFR's persistently <90 mL/min signify possible Chronic Kidney Disease.    Anion gap 9 5 - 15  CBC     Status: Abnormal   Collection Time: 02/05/14  4:50 AM  Result Value Ref Range   WBC 11.3 (H) 4.0 - 10.5 K/uL    Comment: WHITE COUNT CONFIRMED ON SMEAR   RBC 4.40 4.22 - 5.81 MIL/uL   Hemoglobin  13.2 13.0 - 17.0 g/dL   HCT 39.5 39.0 - 52.0 %   MCV 89.8 78.0 - 100.0 fL   MCH 30.0 26.0 - 34.0 pg   MCHC 33.4 30.0 - 36.0 g/dL   RDW 13.2 11.5 - 15.5 %   Platelets 212 150 - 400 K/uL  Potassium     Status: None   Collection Time: 02/05/14  9:30 AM  Result Value Ref Range   Potassium 4.1 3.5 - 5.1 mmol/L    Comment: DELTA CHECK NOTED    Assessment/Plan: Please see problem based Assessment and Plan   Meds ordered this encounter  Medications  . ibuprofen (ADVIL,MOTRIN) 200 MG tablet    Sig: Take 1 tablet (200 mg total) by mouth every 6 (six) hours as needed for mild pain.    Dispense:  30 tablet    Refill:  2 KennedyDO 02/07/2014, 4:19 PM PGY-1, [place]

## 2014-02-07 NOTE — Assessment & Plan Note (Addendum)
A: L>R. Edema most likely due to lymphedema and not CHF. P: Continue Lasix at current dose. Will need to readdress this at next clinic visit as this may not be a good longterm treatment for patient. Lymphedema is not treated with diuretics. Patient to return to clinic 2 weeks for lab draw to check Cr and K. Did not repeat labs as he was just discharged from hospital. At next visit re-access need for Lasix. Do not think this will help his LE edema much.

## 2014-02-08 ENCOUNTER — Telehealth: Payer: Self-pay | Admitting: *Deleted

## 2014-02-08 DIAGNOSIS — I509 Heart failure, unspecified: Secondary | ICD-10-CM | POA: Diagnosis not present

## 2014-02-08 DIAGNOSIS — R269 Unspecified abnormalities of gait and mobility: Secondary | ICD-10-CM | POA: Diagnosis not present

## 2014-02-08 DIAGNOSIS — I429 Cardiomyopathy, unspecified: Secondary | ICD-10-CM | POA: Diagnosis not present

## 2014-02-08 DIAGNOSIS — L03115 Cellulitis of right lower limb: Secondary | ICD-10-CM | POA: Diagnosis not present

## 2014-02-08 DIAGNOSIS — S14106S Unspecified injury at C6 level of cervical spinal cord, sequela: Secondary | ICD-10-CM | POA: Diagnosis not present

## 2014-02-08 DIAGNOSIS — J441 Chronic obstructive pulmonary disease with (acute) exacerbation: Secondary | ICD-10-CM | POA: Diagnosis not present
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
## 2014-02-09 DIAGNOSIS — L03115 Cellulitis of right lower limb: Secondary | ICD-10-CM | POA: Diagnosis not present

## 2014-02-09 DIAGNOSIS — I509 Heart failure, unspecified: Secondary | ICD-10-CM | POA: Diagnosis not present

## 2014-02-09 DIAGNOSIS — I429 Cardiomyopathy, unspecified: Secondary | ICD-10-CM | POA: Diagnosis not present

## 2014-02-09 DIAGNOSIS — J441 Chronic obstructive pulmonary disease with (acute) exacerbation: Secondary | ICD-10-CM | POA: Diagnosis not present

## 2014-02-09 DIAGNOSIS — S14106S Unspecified injury at C6 level of cervical spinal cord, sequela: Secondary | ICD-10-CM | POA: Diagnosis not present

## 2014-02-09 DIAGNOSIS — R269 Unspecified abnormalities of gait and mobility: Secondary | ICD-10-CM | POA: Diagnosis not present

## 2014-02-09 NOTE — Telephone Encounter (Signed)
I will let patient's PCP discuss this with [facility]. I saw patient for hosp f/u and he did not mention this.

## 2014-02-10 DIAGNOSIS — S14106S Unspecified injury at C6 level of cervical spinal cord, sequela: Secondary | ICD-10-CM | POA: Diagnosis not present

## 2014-02-10 DIAGNOSIS — I509 Heart failure, unspecified: Secondary | ICD-10-CM | POA: Diagnosis not present

## 2014-02-10 DIAGNOSIS — R269 Unspecified abnormalities of gait and mobility: Secondary | ICD-10-CM | POA: Diagnosis not present

## 2014-02-10 DIAGNOSIS — I429 Cardiomyopathy, unspecified: Secondary | ICD-10-CM | POA: Diagnosis not present

## 2014-02-10 DIAGNOSIS — L03115 Cellulitis of right lower limb: Secondary | ICD-10-CM | POA: Diagnosis not present

## 2014-02-10 DIAGNOSIS — J441 Chronic obstructive pulmonary disease with (acute) exacerbation: Secondary | ICD-10-CM | POA: Diagnosis not present

## 2014-02-13 DIAGNOSIS — J441 Chronic obstructive pulmonary disease with (acute) exacerbation: Secondary | ICD-10-CM | POA: Diagnosis not present

## 2014-02-13 DIAGNOSIS — I509 Heart failure, unspecified: Secondary | ICD-10-CM | POA: Diagnosis not present

## 2014-02-13 DIAGNOSIS — I429 Cardiomyopathy, unspecified: Secondary | ICD-10-CM | POA: Diagnosis not present

## 2014-02-13 DIAGNOSIS — R269 Unspecified abnormalities of gait and mobility: Secondary | ICD-10-CM | POA: Diagnosis not present

## 2014-02-13 DIAGNOSIS — S14106S Unspecified injury at C6 level of cervical spinal cord, sequela: Secondary | ICD-10-CM | POA: Diagnosis not present

## 2014-02-13 DIAGNOSIS — L03115 Cellulitis of right lower limb: Secondary | ICD-10-CM | POA: Diagnosis not present

## 2014-02-14 DIAGNOSIS — L03115 Cellulitis of right lower limb: Secondary | ICD-10-CM | POA: Diagnosis not present

## 2014-02-14 DIAGNOSIS — R269 Unspecified abnormalities of gait and mobility: Secondary | ICD-10-CM | POA: Diagnosis not present

## 2014-02-14 DIAGNOSIS — S14106S Unspecified injury at C6 level of cervical spinal cord, sequela: Secondary | ICD-10-CM | POA: Diagnosis not present

## 2014-02-14 DIAGNOSIS — I429 Cardiomyopathy, unspecified: Secondary | ICD-10-CM | POA: Diagnosis not present

## 2014-02-14 DIAGNOSIS — I509 Heart failure, unspecified: Secondary | ICD-10-CM | POA: Diagnosis not present

## 2014-02-14 DIAGNOSIS — J441 Chronic obstructive pulmonary disease with (acute) exacerbation: Secondary | ICD-10-CM | POA: Diagnosis not present

## 2014-02-15 DIAGNOSIS — I509 Heart failure, unspecified: Secondary | ICD-10-CM | POA: Diagnosis not present

## 2014-02-15 DIAGNOSIS — I429 Cardiomyopathy, unspecified: Secondary | ICD-10-CM | POA: Diagnosis not present

## 2014-02-15 DIAGNOSIS — S14106S Unspecified injury at C6 level of cervical spinal cord, sequela: Secondary | ICD-10-CM | POA: Diagnosis not present

## 2014-02-15 DIAGNOSIS — L03115 Cellulitis of right lower limb: Secondary | ICD-10-CM | POA: Diagnosis not present

## 2014-02-15 DIAGNOSIS — J441 Chronic obstructive pulmonary disease with (acute) exacerbation: Secondary | ICD-10-CM | POA: Diagnosis not present

## 2014-02-15 DIAGNOSIS — R269 Unspecified abnormalities of gait and mobility: Secondary | ICD-10-CM | POA: Diagnosis not present

## 2014-02-16 DIAGNOSIS — I429 Cardiomyopathy, unspecified: Secondary | ICD-10-CM | POA: Diagnosis not present

## 2014-02-16 DIAGNOSIS — I509 Heart failure, unspecified: Secondary | ICD-10-CM | POA: Diagnosis not present

## 2014-02-16 DIAGNOSIS — R269 Unspecified abnormalities of gait and mobility: Secondary | ICD-10-CM | POA: Diagnosis not present

## 2014-02-16 DIAGNOSIS — L03115 Cellulitis of right lower limb: Secondary | ICD-10-CM | POA: Diagnosis not present

## 2014-02-16 DIAGNOSIS — J441 Chronic obstructive pulmonary disease with (acute) exacerbation: Secondary | ICD-10-CM | POA: Diagnosis not present

## 2014-02-16 DIAGNOSIS — S14106S Unspecified injury at C6 level of cervical spinal cord, sequela: Secondary | ICD-10-CM | POA: Diagnosis not present

## 2014-02-16 NOTE — Telephone Encounter (Signed)
Orders sent.   CGM

## 2014-02-17 DIAGNOSIS — I509 Heart failure, unspecified: Secondary | ICD-10-CM | POA: Diagnosis not present

## 2014-02-17 DIAGNOSIS — L03115 Cellulitis of right lower limb: Secondary | ICD-10-CM | POA: Diagnosis not present

## 2014-02-17 DIAGNOSIS — S14106S Unspecified injury at C6 level of cervical spinal cord, sequela: Secondary | ICD-10-CM | POA: Diagnosis not present

## 2014-02-17 DIAGNOSIS — J441 Chronic obstructive pulmonary disease with (acute) exacerbation: Secondary | ICD-10-CM | POA: Diagnosis not present

## 2014-02-17 DIAGNOSIS — I429 Cardiomyopathy, unspecified: Secondary | ICD-10-CM | POA: Diagnosis not present

## 2014-02-17 DIAGNOSIS — R269 Unspecified abnormalities of gait and mobility: Secondary | ICD-10-CM | POA: Diagnosis not present

## 2014-02-21 ENCOUNTER — Telehealth: Payer: Self-pay | Admitting: *Deleted

## 2014-02-21 DIAGNOSIS — I429 Cardiomyopathy, unspecified: Secondary | ICD-10-CM | POA: Diagnosis not present

## 2014-02-21 DIAGNOSIS — J441 Chronic obstructive pulmonary disease with (acute) exacerbation: Secondary | ICD-10-CM | POA: Diagnosis not present

## 2014-02-21 DIAGNOSIS — R269 Unspecified abnormalities of gait and mobility: Secondary | ICD-10-CM | POA: Diagnosis not present

## 2014-02-21 DIAGNOSIS — I509 Heart failure, unspecified: Secondary | ICD-10-CM | POA: Diagnosis not present

## 2014-02-21 DIAGNOSIS — L03115 Cellulitis of right lower limb: Secondary | ICD-10-CM | POA: Diagnosis not present

## 2014-02-21 DIAGNOSIS — S14106S Unspecified injury at C6 level of cervical spinal cord, sequela: Secondary | ICD-10-CM | POA: Diagnosis not present

## 2014-02-21 NOTE — Telephone Encounter (Signed)
[name], Physical Therapist with [facility] called to check status of order for custom fit brace for pt.  Called [facility]; they have not received a faxed order for the patient.  Please send the order to [facility] in [place], [place] fax number [phone] (please include the area code or the fax will not go through.  [name], RN

## 2014-02-21 NOTE — Telephone Encounter (Signed)
Will forward to MD. Kirsi Hugh,CMA  

## 2014-02-23 DIAGNOSIS — S14106S Unspecified injury at C6 level of cervical spinal cord, sequela: Secondary | ICD-10-CM | POA: Diagnosis not present

## 2014-02-23 DIAGNOSIS — I509 Heart failure, unspecified: Secondary | ICD-10-CM | POA: Diagnosis not present

## 2014-02-23 DIAGNOSIS — L03115 Cellulitis of right lower limb: Secondary | ICD-10-CM | POA: Diagnosis not present

## 2014-02-23 DIAGNOSIS — J441 Chronic obstructive pulmonary disease with (acute) exacerbation: Secondary | ICD-10-CM | POA: Diagnosis not present

## 2014-02-23 DIAGNOSIS — I429 Cardiomyopathy, unspecified: Secondary | ICD-10-CM | POA: Diagnosis not present

## 2014-02-23 DIAGNOSIS — R269 Unspecified abnormalities of gait and mobility: Secondary | ICD-10-CM | POA: Diagnosis not present

## 2014-02-28 DIAGNOSIS — I509 Heart failure, unspecified: Secondary | ICD-10-CM | POA: Diagnosis not present

## 2014-02-28 DIAGNOSIS — S14106S Unspecified injury at C6 level of cervical spinal cord, sequela: Secondary | ICD-10-CM | POA: Diagnosis not present

## 2014-02-28 DIAGNOSIS — I429 Cardiomyopathy, unspecified: Secondary | ICD-10-CM | POA: Diagnosis not present

## 2014-02-28 DIAGNOSIS — L03115 Cellulitis of right lower limb: Secondary | ICD-10-CM | POA: Diagnosis not present

## 2014-02-28 DIAGNOSIS — J441 Chronic obstructive pulmonary disease with (acute) exacerbation: Secondary | ICD-10-CM | POA: Diagnosis not present

## 2014-02-28 DIAGNOSIS — R269 Unspecified abnormalities of gait and mobility: Secondary | ICD-10-CM | POA: Diagnosis not present

## 2014-03-02 DIAGNOSIS — R269 Unspecified abnormalities of gait and mobility: Secondary | ICD-10-CM | POA: Diagnosis not present

## 2014-03-02 DIAGNOSIS — S14106S Unspecified injury at C6 level of cervical spinal cord, sequela: Secondary | ICD-10-CM | POA: Diagnosis not present

## 2014-03-02 DIAGNOSIS — I429 Cardiomyopathy, unspecified: Secondary | ICD-10-CM | POA: Diagnosis not present

## 2014-03-02 DIAGNOSIS — I509 Heart failure, unspecified: Secondary | ICD-10-CM | POA: Diagnosis not present

## 2014-03-02 DIAGNOSIS — L03115 Cellulitis of right lower limb: Secondary | ICD-10-CM | POA: Diagnosis not present

## 2014-03-02 DIAGNOSIS — J441 Chronic obstructive pulmonary disease with (acute) exacerbation: Secondary | ICD-10-CM | POA: Diagnosis not present

## 2014-03-03 DIAGNOSIS — R269 Unspecified abnormalities of gait and mobility: Secondary | ICD-10-CM | POA: Diagnosis not present

## 2014-03-03 DIAGNOSIS — J441 Chronic obstructive pulmonary disease with (acute) exacerbation: Secondary | ICD-10-CM | POA: Diagnosis not present

## 2014-03-03 DIAGNOSIS — I509 Heart failure, unspecified: Secondary | ICD-10-CM | POA: Diagnosis not present

## 2014-03-03 DIAGNOSIS — L03115 Cellulitis of right lower limb: Secondary | ICD-10-CM | POA: Diagnosis not present

## 2014-03-03 DIAGNOSIS — S14106S Unspecified injury at C6 level of cervical spinal cord, sequela: Secondary | ICD-10-CM | POA: Diagnosis not present

## 2014-03-03 DIAGNOSIS — I429 Cardiomyopathy, unspecified: Secondary | ICD-10-CM | POA: Diagnosis not present

## 2014-03-23 ENCOUNTER — Ambulatory Visit: Payer: Medicare Other | Admitting: Family Medicine

## 2014-03-27 ENCOUNTER — Ambulatory Visit: Payer: Medicare Other | Admitting: Family Medicine

## 2014-04-04 DIAGNOSIS — Z87891 Personal history of nicotine dependence: Secondary | ICD-10-CM | POA: Diagnosis not present

## 2014-04-04 DIAGNOSIS — R300 Dysuria: Secondary | ICD-10-CM | POA: Diagnosis not present

## 2014-04-04 DIAGNOSIS — Z96641 Presence of right artificial hip joint: Secondary | ICD-10-CM | POA: Diagnosis not present

## 2014-04-04 DIAGNOSIS — E669 Obesity, unspecified: Secondary | ICD-10-CM | POA: Diagnosis not present

## 2014-04-04 DIAGNOSIS — N390 Urinary tract infection, site not specified: Secondary | ICD-10-CM | POA: Diagnosis not present

## 2014-04-04 DIAGNOSIS — I10 Essential (primary) hypertension: Secondary | ICD-10-CM | POA: Diagnosis not present

## 2014-04-08 ENCOUNTER — Encounter (HOSPITAL_COMMUNITY): Payer: Self-pay | Admitting: *Deleted

## 2014-04-08 ENCOUNTER — Emergency Department (HOSPITAL_COMMUNITY): Payer: Medicare Other

## 2014-04-08 ENCOUNTER — Inpatient Hospital Stay (HOSPITAL_COMMUNITY)
Admission: EM | Admit: 2014-04-08 | Discharge: 2014-04-10 | DRG: 603 | Disposition: A | Discharge status: Home Health | Payer: Medicare Other | Attending: Family Medicine | Admitting: Family Medicine

## 2014-04-08 DIAGNOSIS — F4321 Adjustment disorder with depressed mood: Secondary | ICD-10-CM | POA: Diagnosis present

## 2014-04-08 DIAGNOSIS — Z7982 Long term (current) use of aspirin: Secondary | ICD-10-CM

## 2014-04-08 DIAGNOSIS — I5042 Chronic combined systolic (congestive) and diastolic (congestive) heart failure: Secondary | ICD-10-CM | POA: Diagnosis present

## 2014-04-08 DIAGNOSIS — I890 Lymphedema, not elsewhere classified: Secondary | ICD-10-CM | POA: Diagnosis present

## 2014-04-08 DIAGNOSIS — I509 Heart failure, unspecified: Secondary | ICD-10-CM | POA: Diagnosis not present

## 2014-04-08 DIAGNOSIS — L03116 Cellulitis of left lower limb: Principal | ICD-10-CM | POA: Diagnosis present

## 2014-04-08 DIAGNOSIS — I5022 Chronic systolic (congestive) heart failure: Secondary | ICD-10-CM

## 2014-04-08 DIAGNOSIS — I10 Essential (primary) hypertension: Secondary | ICD-10-CM | POA: Diagnosis present

## 2014-04-08 DIAGNOSIS — I429 Cardiomyopathy, unspecified: Secondary | ICD-10-CM | POA: Diagnosis present

## 2014-04-08 DIAGNOSIS — G8220 Paraplegia, unspecified: Secondary | ICD-10-CM | POA: Diagnosis present

## 2014-04-08 DIAGNOSIS — Z993 Dependence on wheelchair: Secondary | ICD-10-CM

## 2014-04-08 DIAGNOSIS — Z87891 Personal history of nicotine dependence: Secondary | ICD-10-CM | POA: Diagnosis not present

## 2014-04-08 DIAGNOSIS — Z6841 Body Mass Index (BMI) 40.0 and over, adult: Secondary | ICD-10-CM | POA: Diagnosis not present

## 2014-04-08 DIAGNOSIS — Z79899 Other long term (current) drug therapy: Secondary | ICD-10-CM

## 2014-04-08 DIAGNOSIS — M7989 Other specified soft tissue disorders: Secondary | ICD-10-CM

## 2014-04-08 DIAGNOSIS — R9431 Abnormal electrocardiogram [ECG] [EKG]: Secondary | ICD-10-CM | POA: Diagnosis not present

## 2014-04-08 DIAGNOSIS — L03115 Cellulitis of right lower limb: Secondary | ICD-10-CM | POA: Diagnosis not present

## 2014-04-08 DIAGNOSIS — L0390 Cellulitis, unspecified: Secondary | ICD-10-CM | POA: Diagnosis present

## 2014-04-08 DIAGNOSIS — Z96649 Presence of unspecified artificial hip joint: Secondary | ICD-10-CM | POA: Diagnosis present

## 2014-04-08 LAB — COMPREHENSIVE METABOLIC PANEL
ALBUMIN: 4 g/dL (ref 3.5–5.2)
ALT: 32 U/L (ref 0–53)
ANION GAP: 11 (ref 5–15)
AST: 44 U/L — AB (ref 0–37)
Alkaline Phosphatase: 52 U/L (ref 39–117)
BILIRUBIN TOTAL: 0.9 mg/dL (ref 0.3–1.2)
BUN: 9 mg/dL (ref 6–23)
CHLORIDE: 103 mmol/L (ref 96–112)
CO2: 25 mmol/L (ref 19–32)
CREATININE: 1.01 mg/dL (ref 0.50–1.35)
Calcium: 9.4 mg/dL (ref 8.4–10.5)
GFR calc non Af Amer: 89 mL/min — ABNORMAL LOW (ref >90)
Glucose, Bld: 90 mg/dL (ref 70–99)
Potassium: 4.5 mmol/L (ref 3.5–5.1)
Sodium: 139 mmol/L (ref 135–145)
Total Protein: 7.6 g/dL (ref 6.0–8.3)

## 2014-04-08 LAB — CBC WITH DIFFERENTIAL/PLATELET
BASOS PCT: 0 % (ref 0–1)
Basophils Absolute: 0 10*3/uL (ref 0.0–0.1)
EOS PCT: 3 % (ref 0–5)
Eosinophils Absolute: 0.2 10*3/uL (ref 0.0–0.7)
HCT: 44.6 % (ref 39.0–52.0)
Hemoglobin: 14.6 g/dL (ref 13.0–17.0)
LYMPHS ABS: 2.3 10*3/uL (ref 0.7–4.0)
Lymphocytes Relative: 24 % (ref 12–46)
MCH: 29.6 pg (ref 26.0–34.0)
MCHC: 32.7 g/dL (ref 30.0–36.0)
MCV: 90.5 fL (ref 78.0–100.0)
MONOS PCT: 6 % (ref 3–12)
Monocytes Absolute: 0.6 10*3/uL (ref 0.1–1.0)
NEUTROS ABS: 6.6 10*3/uL (ref 1.7–7.7)
Neutrophils Relative %: 68 % (ref 43–77)
Platelets: ADEQUATE 10*3/uL (ref 150–400)
RBC: 4.93 MIL/uL (ref 4.22–5.81)
RDW: 13.2 % (ref 11.5–15.5)
WBC: 9.5 10*3/uL (ref 4.0–10.5)

## 2014-04-08 LAB — BRAIN NATRIURETIC PEPTIDE: B Natriuretic Peptide: 24.5 pg/mL (ref 0.0–100.0)

## 2014-04-08 LAB — URINALYSIS, ROUTINE W REFLEX MICROSCOPIC
Bilirubin Urine: NEGATIVE
Glucose, UA: NEGATIVE mg/dL
HGB URINE DIPSTICK: NEGATIVE
KETONES UR: NEGATIVE mg/dL
LEUKOCYTES UA: NEGATIVE
NITRITE: NEGATIVE
PH: 7 (ref 5.0–8.0)
Protein, ur: NEGATIVE mg/dL
SPECIFIC GRAVITY, URINE: 1.028 (ref 1.005–1.030)
Urobilinogen, UA: 1 mg/dL (ref 0.0–1.0)

## 2014-04-08 LAB — I-STAT CG4 LACTIC ACID, ED
Lactic Acid, Venous: 0.91 mmol/L (ref 0.5–2.0)
Lactic Acid, Venous: 0.53 mmol/L (ref 0.5–2.0)

## 2014-04-08 MED ORDER — VANCOMYCIN HCL IN DEXTROSE 1-5 GM/200ML-% IV SOLN
1000.0000 mg | Freq: Once | INTRAVENOUS | Status: AC
  Administered 2014-04-08: 1000 mg via INTRAVENOUS
  Filled 2014-04-08: qty 200
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
## 2014-04-08 NOTE — ED Notes (Signed)
IV attempts x2; no success.

## 2014-04-08 NOTE — ED Notes (Signed)
Patient returned from X-ray 

## 2014-04-08 NOTE — ED Notes (Signed)
Patient transported to X-ray 

## 2014-04-08 NOTE — ED Notes (Signed)
No answer in waiting.  
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
## 2014-04-09 ENCOUNTER — Encounter (HOSPITAL_COMMUNITY): Payer: Self-pay | Admitting: *Deleted

## 2014-04-09 DIAGNOSIS — Z6841 Body Mass Index (BMI) 40.0 and over, adult: Secondary | ICD-10-CM | POA: Diagnosis not present

## 2014-04-09 DIAGNOSIS — I509 Heart failure, unspecified: Secondary | ICD-10-CM | POA: Insufficient documentation

## 2014-04-09 DIAGNOSIS — I5022 Chronic systolic (congestive) heart failure: Secondary | ICD-10-CM | POA: Diagnosis not present

## 2014-04-09 DIAGNOSIS — M7989 Other specified soft tissue disorders: Secondary | ICD-10-CM | POA: Diagnosis not present

## 2014-04-09 DIAGNOSIS — L03115 Cellulitis of right lower limb: Secondary | ICD-10-CM | POA: Insufficient documentation

## 2014-04-09 DIAGNOSIS — L03116 Cellulitis of left lower limb: Secondary | ICD-10-CM | POA: Diagnosis not present

## 2014-04-09 DIAGNOSIS — I429 Cardiomyopathy, unspecified: Secondary | ICD-10-CM | POA: Diagnosis not present

## 2014-04-09 DIAGNOSIS — I10 Essential (primary) hypertension: Secondary | ICD-10-CM | POA: Diagnosis present

## 2014-04-09 DIAGNOSIS — G8220 Paraplegia, unspecified: Secondary | ICD-10-CM | POA: Diagnosis not present

## 2014-04-09 DIAGNOSIS — F4321 Adjustment disorder with depressed mood: Secondary | ICD-10-CM | POA: Diagnosis present

## 2014-04-09 DIAGNOSIS — Z96649 Presence of unspecified artificial hip joint: Secondary | ICD-10-CM | POA: Diagnosis present

## 2014-04-09 DIAGNOSIS — Z79899 Other long term (current) drug therapy: Secondary | ICD-10-CM | POA: Diagnosis not present

## 2014-04-09 DIAGNOSIS — L0390 Cellulitis, unspecified: Secondary | ICD-10-CM | POA: Diagnosis present

## 2014-04-09 DIAGNOSIS — I890 Lymphedema, not elsewhere classified: Secondary | ICD-10-CM | POA: Diagnosis not present

## 2014-04-09 DIAGNOSIS — Z993 Dependence on wheelchair: Secondary | ICD-10-CM | POA: Diagnosis not present

## 2014-04-09 DIAGNOSIS — I5042 Chronic combined systolic (congestive) and diastolic (congestive) heart failure: Secondary | ICD-10-CM | POA: Diagnosis present

## 2014-04-09 DIAGNOSIS — Z7982 Long term (current) use of aspirin: Secondary | ICD-10-CM | POA: Diagnosis not present

## 2014-04-09 DIAGNOSIS — Z87891 Personal history of nicotine dependence: Secondary | ICD-10-CM | POA: Diagnosis not present

## 2014-04-09 LAB — CBC
HCT: 37.4 % — ABNORMAL LOW (ref 39.0–52.0)
Hemoglobin: 12.3 g/dL — ABNORMAL LOW (ref 13.0–17.0)
MCH: 29.4 pg (ref 26.0–34.0)
MCHC: 32.9 g/dL (ref 30.0–36.0)
MCV: 89.5 fL (ref 78.0–100.0)
PLATELETS: 173 10*3/uL (ref 150–400)
RBC: 4.18 MIL/uL — ABNORMAL LOW (ref 4.22–5.81)
RDW: 13.2 % (ref 11.5–15.5)
WBC: 8.7 10*3/uL (ref 4.0–10.5)

## 2014-04-09 MED ORDER — ACETAMINOPHEN 650 MG RE SUPP: 650.0000 mg | Freq: Four times a day (QID) | RECTAL | Status: DC | PRN

## 2014-04-09 MED ORDER — FAMOTIDINE 20 MG PO TABS
20.0000 mg | ORAL_TABLET | Freq: Every day | ORAL | Status: DC
  Administered 2014-04-09 – 2014-04-10 (×2): 20 mg via ORAL
  Filled 2014-04-09 (×2): qty 1

## 2014-04-09 MED ORDER — CLINDAMYCIN HCL 300 MG PO CAPS
300.0000 mg | ORAL_CAPSULE | Freq: Four times a day (QID) | ORAL | Status: DC
  Administered 2014-04-09 – 2014-04-10 (×4): 300 mg via ORAL
  Filled 2014-04-09 (×3): qty 1

## 2014-04-09 MED ORDER — CLINDAMYCIN PHOSPHATE 600 MG/50ML IV SOLN
600.0000 mg | Freq: Four times a day (QID) | INTRAVENOUS | Status: AC
  Administered 2014-04-09 (×3): 600 mg via INTRAVENOUS
  Filled 2014-04-09 (×4): qty 50

## 2014-04-09 MED ORDER — ACETAMINOPHEN 325 MG PO TABS: 650.0000 mg | ORAL_TABLET | Freq: Four times a day (QID) | ORAL | Status: DC | PRN

## 2014-04-09 MED ORDER — SODIUM CHLORIDE 0.9 % IV SOLN: 250.0000 mL | INTRAVENOUS | Status: DC | PRN

## 2014-04-09 MED ORDER — ASPIRIN EC 81 MG PO TBEC
81.0000 mg | DELAYED_RELEASE_TABLET | Freq: Every day | ORAL | Status: DC
  Administered 2014-04-09 – 2014-04-10 (×2): 81 mg via ORAL
  Filled 2014-04-09 (×2): qty 1

## 2014-04-09 MED ORDER — POLYETHYLENE GLYCOL 3350 17 G PO PACK: 17.0000 g | PACK | Freq: Every day | ORAL | Status: DC | PRN

## 2014-04-09 MED ORDER — SODIUM CHLORIDE 0.9 % IJ SOLN
3.0000 mL | Freq: Two times a day (BID) | INTRAMUSCULAR | Status: DC
  Administered 2014-04-09 – 2014-04-10 (×3): 3 mL via INTRAVENOUS

## 2014-04-09 MED ORDER — METOPROLOL SUCCINATE ER 25 MG PO TB24
12.5000 mg | ORAL_TABLET | Freq: Every day | ORAL | Status: DC
  Administered 2014-04-09 – 2014-04-10 (×2): 12.5 mg via ORAL
  Filled 2014-04-09 (×2): qty 1

## 2014-04-09 MED ORDER — FUROSEMIDE 40 MG PO TABS: 40.0000 mg | ORAL_TABLET | Freq: Every day | ORAL | Status: DC | PRN

## 2014-04-09 MED ORDER — HEPARIN SODIUM (PORCINE) 5000 UNIT/ML IJ SOLN
5000.0000 [IU] | Freq: Three times a day (TID) | INTRAMUSCULAR | Status: DC
  Administered 2014-04-09 – 2014-04-10 (×4): 5000 [IU] via SUBCUTANEOUS
  Filled 2014-04-09 (×5): qty 1

## 2014-04-09 MED ORDER — SODIUM CHLORIDE 0.9 % IJ SOLN: 3.0000 mL | INTRAMUSCULAR | Status: DC | PRN

## 2014-04-09 NOTE — Evaluation (Signed)
Physical Therapy Evaluation Patient Details Name: Jordan Jensen MRN: [id number] DOB: 10-04-1970 Today's Date: 04/09/2014   History of Present Illness  44 yo male with onset of BLE cellulitis and edema, PMHx:  SCI with myelopathy and limited gait on crutches PLOF  Clinical Impression  Pt was set up in chair today with first attempt to walk since admission.  He is motivated and interested in going home which is practical given his level of function at first visit.  He will need to be able to transfer alone as he is sometimes home alone.  Will work toward strengthening and promotion of fluid relief with positioning at end of PT sessions and when in chair.    Follow Up Recommendations Home health PT;Supervision/Assistance - 24 hour    Equipment Recommendations  Rolling walker with 5" wheels    Recommendations for Other Services       Precautions / Restrictions Precautions Precautions: Fall Restrictions Weight Bearing Restrictions: No Other Position/Activity Restrictions: Keep LE's elevated with pillows at all times x walking/transferring      Mobility  Bed Mobility Overal bed mobility: Needs Assistance Bed Mobility: Supine to Sit     Supine to sit: Mod assist;+2 for physical assistance;HOB elevated (assist for LE's and for scooting out to EOB)        Transfers Overall transfer level: Needs assistance Equipment used: Rolling walker (2 wheeled);2 person hand held assist (Assisted with bed elevated, nursing and PT assist standing) Transfers: Sit to/from [employer] Sit to Stand: Mod assist;+2 physical assistance;+2 safety/equipment Stand pivot transfers: Min assist;Mod assist;+2 physical assistance       General transfer comment: assist as pt is struggling to move legs with severe edema, L > R  Ambulation/Gait Ambulation/Gait assistance: Mod assist;+2 physical assistance Ambulation Distance (Feet): 5 Feet Assistive device: Rolling walker (2 wheeled);2 person  hand held assist Gait Pattern/deviations: Step-to pattern;Decreased stride length;Decreased step length - right;Decreased step length - left;Decreased dorsiflexion - right;Decreased dorsiflexion - left;Decreased weight shift to left;Shuffle;Wide base of support Gait velocity: very reduced Gait velocity interpretation: Below normal speed for age/gender    Stairs            Wheelchair Mobility    Modified Rankin (Stroke Patients Only)       Balance Overall balance assessment: Needs assistance   Sitting balance-Leahy Scale: Good   Postural control: Posterior lean Standing balance support: Bilateral upper extremity supported Standing balance-Leahy Scale: Poor Standing balance comment: RW for support and to assist with advancing BLE's to pivot and step to chair                             Pertinent Vitals/Pain Pain Assessment: No/denies pain  BP was 111/58, pulse 84 and O2 sats 100% on room air.    Home Living Family/patient expects to be discharged to:: Private residence Living Arrangements: Children Available Help at Discharge: Family;Available 24 hours/day (Mother and daughters assist in scheduled way) Type of Home: House Home Access: Ramped entrance     Home Layout: One level Home Equipment: Shower seat;Crutches;Grab bars - toilet;Grab bars - tub/shower;Wheelchair - manual Additional Comments: was fitted for stockings, waiting for new shower chair/bench with insurance hold up    Prior Function Level of Independence: Independent with assistive device(s) (WC outdoors and crutches with wc inside)         Comments: pt is normally able to drive     Hand Dominance  Dominant Hand: Right    Extremity/Trunk Assessment   Upper Extremity Assessment: Overall WFL for tasks assessed           Lower Extremity Assessment: Generalized weakness      Cervical / Trunk Assessment: Normal;Other exceptions (Minor stiffness for neck rotation)   Communication   Communication: No difficulties  Cognition Arousal/Alertness: Awake/alert Behavior During Therapy: WFL for tasks assessed/performed Overall Cognitive Status: Within Functional Limits for tasks assessed                      General Comments General comments (skin integrity, edema, etc.): Pt is usually home with family assistance, has short gait on crutches at home and otherwise usign wc for support to be mobile    Exercises        Assessment/Plan    PT Assessment Patient needs continued PT services  PT Diagnosis Generalized weakness;Other (comment) (acute LE edema)   PT Problem List Decreased strength;Decreased range of motion;Decreased activity tolerance;Decreased balance;Decreased mobility;Decreased coordination;Decreased knowledge of use of DME;Cardiopulmonary status limiting activity;Obesity;Decreased skin integrity  PT Treatment Interventions DME instruction;Gait training;Functional mobility training;Therapeutic activities;Therapeutic exercise;Balance training;Neuromuscular re-education;Patient/family education   PT Goals (Current goals can be found in the Care Plan section) Acute Rehab PT Goals Patient Stated Goal: to get swellling out of legs PT Goal Formulation: With patient Time For Goal Achievement: 04/23/14 Potential to Achieve Goals: Good    Frequency Min 3X/week   Barriers to discharge Inaccessible home environment      Co-evaluation               End of Session   Activity Tolerance: Patient limited by fatigue;Treatment limited secondary to medical complications (Comment);Other (comment) (LE edema restricts the ability to have legs in walker space) Patient left: in chair;with call bell/phone within reach;with nursing/sitter in room;Other (comment) (Legs elevated on legrest with extra pillows for edema managm) Nurse Communication: Mobility status         Time: 7253-6644 PT Time Calculation (min) (ACUTE ONLY): 28  min   Charges:   PT Evaluation $Initial PT Evaluation Tier I: 1 Procedure PT Treatments $Therapeutic Activity: 8-22 mins   PT G Codes:        Ivar Drape Apr 11, 2014, 12:55 PM   Samul Dada, PT MS Acute Rehab Dept. Number: [phone]

## 2014-04-09 NOTE — Plan of Care (Signed)
Problem: Phase I Progression Outcomes Goal: OOB as tolerated unless otherwise ordered Outcome: Progressing Sitting in recliner with pillows under legs and feet elevated.

## 2014-04-09 NOTE — Progress Notes (Signed)
Orthopedic Tech Progress Note Patient Details:  Jordan Jensen 1970-06-26 [id number]  Ortho Devices Type of Ortho Device: Roland Rack boot Ortho Device/Splint Location: bilateral Ortho Device/Splint Interventions: Application   Nikki Dom 04/09/2014, 2:32 PM
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
## 2014-04-09 NOTE — Progress Notes (Signed)
UR Completed.  [phone]  

## 2014-04-09 NOTE — Progress Notes (Signed)
Family Medicine Teaching Service Daily Progress Note Intern Pager: [phone]  Patient name: Jordan Jensen Medical record number: [id number] Date of birth: November 27, 1970 Age: 44 y.o. Gender: male  Primary Care Provider: Yolande Jolly, MD Consultants: none Code Status: Full  Pt Overview and Major Events to Date:  4/2 - Admitted for b/l LE edema concerning for recurrent cellulitis, start Clinda IV, LA 0.91 > 0.53, CXR (neg) 4/3 - Clinda IV (Day 2) >> transition to PO 4/4, BNP (24.5, negative), b/l unna boots  Assessment and Plan: Jordan Jensen is a 44 y.o. male presenting with cellulitis. PMH is significant for recurrent lower leg cellulitis, wheelchair dependence after post-traumatic myelopathy / spinal injury, lymphedema, CHF, HTN, morbid obesity, NICM.  # Recurrent b/l LE cellulitis, in setting of chronic LE lymphedema - Improved H/o prior recurrent b/l LE cellulitis with chronic b/l LE lymphedema and morbid obesity with venous stasis. Last hospitalized 02/02/14 (resolved with Clinda in past). No h/o DVT. WBC 9.5 > 8.7 (stable). - s/p Vancomycin x 1 in ED - cont Clinda IV (4/3 >> 4/4) x 24 hrs with stop date - Ordered to transition to Clinda PO 300mg  q 6 hr x 10 days total abx (stop date 4/12) - Ordered Unna boot (bilateral) - Tylenol PRN mild pain, previously on NSAIDs at home, can start if needed - Repeat CBC in AM - Elevate lower extremities, Place TED hose if tolerated  # Chronic mixed diastolic / systolic CHF - Stable Last ECHO 07/2013 45-50 % EF with grade 1 DD.On admission no SOB. No JVD or crackles on lung exam. Initial CXR with some vasc congestion w/o edema. Difficult to assess overall fluid status given chronic b/l lymphedema in LE - BNP 24.5 (4/2), normal, essentially rules out acute CHF exac - cont Lasix 40mg  PO PRN - (does not take regularly) - daily wt, I/Os - Monitor fluid status  # HTN - Stable - Continue home medications  # Morbid obesity - In the past, very  motivated to lose weight per PCP notes.  - consider consult dietitian  FEN/GI: Heart healthy/ SLIV  Prophylaxis: hep sub Q   Disposition: admitted to FPTS for cellulitis, on Clindamycin IV > transition to orals within 24 hours, anticipate discharge to home in 1-2 days.   Subjective:  Today overall feels better. Still c/o b/l LE edema L>R compared to baseline. Pain improved, now 5/10 b/l lower ext. Denies SOB, CP, fevers/chills.  Objective: Temp:  [98.5 F (36.9 C)-98.6 F (37 C)] 98.5 F (36.9 C) (04/03 0635) Pulse Rate:  [69-88] 84 (04/03 0635) Resp:  [12-32] 18 (04/03 0635) BP: (102-148)/(29-83) 111/58 mmHg (04/03 0635) SpO2:  [98 %-100 %] 100 % (04/03 0635) Weight:  [370 lb (167.831 kg)-380 lb 3.2 oz (172.458 kg)] 380 lb 3.2 oz (172.458 kg) (04/03 0148) Physical Exam: General: NAD, morbidly obese, sitting up in bed Cardiovascular: S1S2, RRR, no MRG  Respiratory: CTAB, no wheezing or crackles, non-labored Extremities: chronic b/l lymphedema with b/l LE massively edematous (stable since yesterday, L > R) improved warmth and redness (R>L), mild +TTP localized over bilateral posterior calves and anterior shins, pulses palable. Stable limited ROM in plantar and dorsal flexion of b/l feet.  Skin: chronic changes on left LE. No spreading erythema or skin changes.  Neuro: awake, alert  Laboratory:  Recent Labs Lab 04/08/14 1746 04/09/14 0508  WBC 9.5 8.7  HGB 14.6 12.3*  HCT 44.6 37.4*  PLT PLATELET CLUMPS NOTED ON SMEAR, COUNT APPEARS ADEQUATE 173  Recent Labs Lab 04/08/14 1746  NA 139  K 4.5  CL 103  CO2 25  BUN 9  CREATININE 1.01  CALCIUM 9.4  PROT 7.6  BILITOT 0.9  ALKPHOS 52  ALT 32  AST 44*  GLUCOSE 90   4/2 Lactic Acid - 0.91 >> 0.53  4/2 UA - neg nitrite, neg leuks, neg hgb  4/2 Blood Culture x2 - pending  4/2 BNP 24.5  Imaging/Diagnostic Tests:  4/2 CXR 2v IMPRESSION: Low lung volumes with probable vascular crowding. No definite  edema.  Smitty Cords, DO 04/09/2014, 8:12 AM PGY-2, [facility] FPTS Intern pager: [phone], text pages welcome

## 2014-04-09 NOTE — Plan of Care (Signed)
Problem: Phase I Progression Outcomes Goal: Wound assessment- dressing change as appropriate Outcome: Progressing Unna Boots applied bilaterally by Milon Dikes

## 2014-04-10 DIAGNOSIS — I509 Heart failure, unspecified: Secondary | ICD-10-CM

## 2014-04-10 LAB — CBC
HCT: 38.2 % — ABNORMAL LOW (ref 39.0–52.0)
Hemoglobin: 12.2 g/dL — ABNORMAL LOW (ref 13.0–17.0)
MCH: 28.6 pg (ref 26.0–34.0)
MCHC: 31.9 g/dL (ref 30.0–36.0)
MCV: 89.7 fL (ref 78.0–100.0)
Platelets: 163 10*3/uL (ref 150–400)
RBC: 4.26 MIL/uL (ref 4.22–5.81)
RDW: 13.1 % (ref 11.5–15.5)
WBC: 8.4 10*3/uL (ref 4.0–10.5)

## 2014-04-10 MED ORDER — CLINDAMYCIN HCL 300 MG PO CAPS: 300.0000 mg | ORAL_CAPSULE | Freq: Four times a day (QID) | ORAL | Status: DC

## 2014-04-10 NOTE — Progress Notes (Signed)
Discharged home accompanied by mother.

## 2014-04-10 NOTE — Progress Notes (Signed)
Family Medicine Teaching Service Daily Progress Note Intern Pager: [phone]  Patient name: Jordan Jensen Medical record number: [id number] Date of birth: 12-18-1970 Age: 44 y.o. Gender: male  Primary Care Provider: Yolande Jolly, MD Consultants: None Code Status: Full  Pt Overview and Major Events to Date:  4/2 - Admitted for b/l LE edema concerning for recurrent cellulitis, start Clinda IV, LA 0.91 > 0.53, CXR (neg) 4/3 - Clinda IV (Day 2) >> transition to PO 4/4, BNP (24.5, negative), b/l unna boots  Assessment and Plan: 44 y.o. male presenting with cellulitis. PMH is significant for recurrent lower leg cellulitis, wheelchair dependence after post-traumatic myelopathy / spinal injury, lymphedema, CHF, HTN, morbid obesity, NICM.  # Recurrent b/l LE cellulitis, in setting of chronic LE lymphedema - Improved, H/o prior recurrent b/l LE cellulitis with chronic b/l LE lymphedema and morbid obesity with venous stasis. Last hospitalized 02/02/14 (resolved with Clinda in past). No h/o DVT.  - s/p Vancomycin x 1 in ED - Clindamycin IV (4/3) x 24 hrs with stop date - Transitioned to Clinda PO 300mg  q 6 hr x 10 days total abx (start date 4/3, stop date 4/12) - Unna boot (bilateral) in place - Tylenol PRN mild pain - Elevate lower extremities, Place TED hose if tolerated  # Chronic mixed diastolic / systolic CHF - Stable, Last ECHO 07/2013 45-50 % EF with grade 1 DD. - Initial CXR with some vasc congestion w/o edema.  - BNP 24.5 (4/2), normal, essentially rules out acute CHF exac - Cont Lasix 40mg  PO PRN - (does not take regularly) - Daily wt, I/Os - Monitor fluid status  FEN/GI: Heart healthy/ SLIV  Prophylaxis: hep sub Q   Disposition: Discharge today  Subjective:  States swelling has improved since yesterday. Feels ready for discharge. Has not ambulated yet, since his crutches are at home.   Initially admitted on 04/08/14 with increased warmth and swelling in his lower extremities.  Has history of cellulitis in past resolved with clindamycin.   Objective: Temp:  [98.1 F (36.7 C)-98.7 F (37.1 C)] 98.1 F (36.7 C) (04/04 0552) Pulse Rate:  [72-81] 72 (04/04 0552) Resp:  [18-19] 18 (04/04 0552) BP: (116-125)/(61-73) 116/73 mmHg (04/04 0552) SpO2:  [96 %-100 %] 96 % (04/04 0552) Physical Exam: General: NAD, morbidly obese, sitting up in bed Cardiovascular: S1S2, RRR, no MRG  Respiratory: CTAB, no wheezing or crackles, non-labored Extremities: chronic b/l lymphedema with b/l LE massively edematous (stable since yesterday, L > R), unna boots in place, mild +TTP localized over bilateral posterior calves and anterior shins, pulses palable. Stable limited ROM in plantar and dorsal flexion of b/l feet.  Skin: chronic changes on left LE. No spreading erythema or skin changes.  Neuro: awake, alert  Laboratory:  Recent Labs Lab 04/08/14 1746 04/09/14 0508 04/10/14 0442  WBC 9.5 8.7 8.4  HGB 14.6 12.3* 12.2*  HCT 44.6 37.4* 38.2*  PLT PLATELET CLUMPS NOTED ON SMEAR, COUNT APPEARS ADEQUATE 173 163    Recent Labs Lab 04/08/14 1746  NA 139  K 4.5  CL 103  CO2 25  BUN 9  CREATININE 1.01  CALCIUM 9.4  PROT 7.6  BILITOT 0.9  ALKPHOS 52  ALT 32  AST 44*  GLUCOSE 90   4/2 Lactic Acid - 0.91 >> 0.53  4/2 UA - neg nitrite, neg leuks, neg hgb  4/2 Blood Culture x2 - pending  4/2 BNP 24.5  Imaging/Diagnostic Tests:  4/2 CXR 2v IMPRESSION: Low lung volumes with probable  vascular crowding. No definite edema.  [address] , [place] 04/10/2014, 8:16 AM PGY-1, [facility] Family Medicine FPTS Intern pager: [phone], text pages welcome

## 2014-04-10 NOTE — Care Management Note (Signed)
  Page 1 of 1   04/10/2014     10:05:08 AM CARE MANAGEMENT NOTE 04/10/2014  Patient:  Jordan Jensen, Jordan Jensen   Account Number:  [id number]  Date Initiated:  04/10/2014  Documentation initiated by:  Ronny Flurry  Subjective/Objective Assessment:     Action/Plan:   Anticipated DC Date:  04/10/2014   Anticipated DC Plan:  HOME W HOME HEALTH SERVICES         Choice offered to / List presented to:  C-1 Patient   DME arranged  Levan Hurst      DME agency  [facility]     HH arranged  HH-2 PT      [facility] agency  [facility]   Status of service:  In process, will continue to follow Medicare Important Message given?  YES (If response is "NO", the following Medicare IM given date fields will be blank) Date Medicare IM given:  04/10/2014 Medicare IM given by:  Ronny Flurry Date Additional Medicare IM given:   Additional Medicare IM given by:    Discharge Disposition:  HOME W HOME HEALTH SERVICES  Per UR Regulation:    If discussed at Long Length of Stay Meetings, dates discussed:    Comments:  04-10-14 Confirmed face sheet information with patient .  Spoke to Dr Araceli Bouche to clarify unna boot care [facility] needed??)  . Dr  Caroleen Hamman will clarify and call NCM back . Ronny Flurry RN BSN

## 2014-04-10 NOTE — Discharge Instructions (Signed)

## 2014-04-12 ENCOUNTER — Encounter: Payer: Self-pay | Admitting: Family Medicine

## 2014-04-12 ENCOUNTER — Ambulatory Visit (INDEPENDENT_AMBULATORY_CARE_PROVIDER_SITE_OTHER): Payer: Medicare Other | Admitting: Family Medicine

## 2014-04-12 VITALS — BP 126/78 | HR 78 | Temp 98.5°F | Ht 71.0 in | Wt 388.0 lb

## 2014-04-12 DIAGNOSIS — L03116 Cellulitis of left lower limb: Secondary | ICD-10-CM | POA: Diagnosis not present

## 2014-04-12 DIAGNOSIS — I890 Lymphedema, not elsewhere classified: Secondary | ICD-10-CM | POA: Diagnosis not present

## 2014-04-12 MED ORDER — CLINDAMYCIN HCL 300 MG PO CAPS: 300.0000 mg | ORAL_CAPSULE | Freq: Four times a day (QID) | ORAL | Status: AC

## 2014-04-12 NOTE — Patient Instructions (Signed)
Thanks for coming in today.   I have sent in a prescription for clindamycin as a prophylactic antibiotic to keep at home for future use if you develop cellulitis.   I have sent in an order for home health to send out a nurse to help you with Una boots for you r legs.   I will call and try to touch base with the [facility] lymphed[facility] clinic to see how we can get you in there.   We will see you back in 3 months if nothing else comes up before then.   Thanks for letting us take care of you.   Sincerely,  Devota Pace, MD Family Medicine - PGY 1

## 2014-04-14 ENCOUNTER — Telehealth: Payer: Self-pay | Admitting: Family Medicine

## 2014-04-14 NOTE — Telephone Encounter (Signed)
Called to inform provider that patient has cancelled having PT services through [facility]. Was informed that provider had stated at last visit he did not need.

## 2014-04-15 LAB — CULTURE, BLOOD (ROUTINE X 2)
Culture: NO GROWTH
Culture: NO GROWTH

## 2014-04-16 DIAGNOSIS — Z87891 Personal history of nicotine dependence: Secondary | ICD-10-CM | POA: Diagnosis not present

## 2014-04-16 DIAGNOSIS — L03116 Cellulitis of left lower limb: Secondary | ICD-10-CM | POA: Diagnosis not present

## 2014-04-16 DIAGNOSIS — G8220 Paraplegia, unspecified: Secondary | ICD-10-CM | POA: Diagnosis not present

## 2014-04-16 DIAGNOSIS — F329 Major depressive disorder, single episode, unspecified: Secondary | ICD-10-CM | POA: Diagnosis not present

## 2014-04-16 DIAGNOSIS — I5020 Unspecified systolic (congestive) heart failure: Secondary | ICD-10-CM | POA: Diagnosis not present

## 2014-04-16 DIAGNOSIS — L03115 Cellulitis of right lower limb: Secondary | ICD-10-CM | POA: Diagnosis not present

## 2014-04-16 DIAGNOSIS — I890 Lymphedema, not elsewhere classified: Secondary | ICD-10-CM | POA: Diagnosis not present

## 2014-04-17 DIAGNOSIS — L03115 Cellulitis of right lower limb: Secondary | ICD-10-CM | POA: Diagnosis not present

## 2014-04-17 DIAGNOSIS — G8220 Paraplegia, unspecified: Secondary | ICD-10-CM | POA: Diagnosis not present

## 2014-04-17 DIAGNOSIS — L03116 Cellulitis of left lower limb: Secondary | ICD-10-CM | POA: Diagnosis not present

## 2014-04-17 DIAGNOSIS — I890 Lymphedema, not elsewhere classified: Secondary | ICD-10-CM | POA: Diagnosis not present

## 2014-04-17 DIAGNOSIS — F329 Major depressive disorder, single episode, unspecified: Secondary | ICD-10-CM | POA: Diagnosis not present

## 2014-04-17 DIAGNOSIS — I5020 Unspecified systolic (congestive) heart failure: Secondary | ICD-10-CM | POA: Diagnosis not present

## 2014-04-22 DIAGNOSIS — G8220 Paraplegia, unspecified: Secondary | ICD-10-CM | POA: Diagnosis not present

## 2014-04-22 DIAGNOSIS — I5020 Unspecified systolic (congestive) heart failure: Secondary | ICD-10-CM | POA: Diagnosis not present

## 2014-04-22 DIAGNOSIS — L03115 Cellulitis of right lower limb: Secondary | ICD-10-CM | POA: Diagnosis not present

## 2014-04-22 DIAGNOSIS — I890 Lymphedema, not elsewhere classified: Secondary | ICD-10-CM | POA: Diagnosis not present

## 2014-04-22 DIAGNOSIS — F329 Major depressive disorder, single episode, unspecified: Secondary | ICD-10-CM | POA: Diagnosis not present

## 2014-04-22 DIAGNOSIS — L03116 Cellulitis of left lower limb: Secondary | ICD-10-CM | POA: Diagnosis not present

## 2014-04-25 DIAGNOSIS — F329 Major depressive disorder, single episode, unspecified: Secondary | ICD-10-CM | POA: Diagnosis not present

## 2014-04-25 DIAGNOSIS — I890 Lymphedema, not elsewhere classified: Secondary | ICD-10-CM | POA: Diagnosis not present

## 2014-04-25 DIAGNOSIS — L03116 Cellulitis of left lower limb: Secondary | ICD-10-CM | POA: Diagnosis not present

## 2014-04-25 DIAGNOSIS — G8220 Paraplegia, unspecified: Secondary | ICD-10-CM | POA: Diagnosis not present

## 2014-04-25 DIAGNOSIS — I5020 Unspecified systolic (congestive) heart failure: Secondary | ICD-10-CM | POA: Diagnosis not present

## 2014-04-25 DIAGNOSIS — L03115 Cellulitis of right lower limb: Secondary | ICD-10-CM | POA: Diagnosis not present

## 2014-04-25 NOTE — Progress Notes (Signed)
Patient ID: Jordan Jensen, male   DOB: 05-Feb-1970, 44 y.o.   MRN: [id number]   [facility] Yolande Jolly, MD Phone: [phone]  Subjective:   # Hospital Follow Up for Lower Extremity Edema  - Pt. Here for hospital follow up for cellulitis / lower extremity edema - He feels much better today.  - No evidence of infection according to him - He says that he often gets symptoms at home, and feels that if he had an antibiotic at home then he could take this to prevent having to come to the hospital.  - He has not had any fevers, chills, nausea / vomiting at home.  - He would like to get in to a lymphedema clinic at [facility] if possible to help improve his lower extremity swelling.  - Lasix does not help much with his lymphedema.  - He cut off his una boots to come in to clinic today.   All relevant systems were reviewed and were negative unless otherwise noted in the HPI  Past Medical History Reviewed problem list.  Medications- reviewed and updated Current Outpatient Prescriptions  Medication Sig Dispense Refill  . aspirin EC 81 MG EC tablet Take 1 tablet (81 mg total) by mouth daily. 30 tablet 1  . famotidine (PEPCID) 20 MG tablet Take 1 tablet (20 mg total) by mouth daily. 30 tablet 1  . furosemide (LASIX) 20 MG tablet Take 2 tablets (40 mg total) by mouth daily. (Patient taking differently: Take 40 mg by mouth daily as needed for edema. ) 60 tablet 0  . guaifenesin (ROBITUSSIN) 100 MG/5ML syrup Take 10 mLs (200 mg total) by mouth every 4 (four) hours as needed for congestion. (Patient not taking: Reported on 04/08/2014) 120 mL 0  . ibuprofen (ADVIL,MOTRIN) 200 MG tablet Take 1 tablet (200 mg total) by mouth every 6 (six) hours as needed for mild pain. 30 tablet 2  . metoprolol succinate (TOPROL-XL) 25 MG 24 hr tablet Take 0.5 tablets (12.5 mg total) by mouth daily. 30 tablet 0   No current facility-administered medications for this visit.   Chief  complaint-noted No additions to family history Social history- patient is a non smoker  Objective: BP 126/78 mmHg  Pulse 78  Temp(Src) 98.5 F (36.9 C) (Oral)  Ht 5\' 11"  (1.803 m)  Wt 388 lb (175.996 kg)  BMI 54.14 kg/m2 Gen: NAD, alert, cooperative with exam HEENT: NCAT, EOMI, PERRL Neck: FROM, supple CV: RRR, good S1/S2, no murmur Resp: CTABL, no wheezes, non-labored Abd: SNTND, BS present, no guarding or organomegaly Ext: Diffuse large volume edema of his lower extremities. Somewhat larger than baseline per my exam. No evidence of cellulitis, warmth, redness, or drainage, no ulcerations, warm, normal tone, moves UE/LE spontaneously Neuro: Alert and oriented, No gross deficits Skin: no rashes no lesions  Assessment/Plan:  # Lymphedema - chronic and known problem for him. He would like to get into [facility]'s lymphedema clinic if possible. Otherwise, he would also like a prophylactic clindamycin prescription given the frequency of his cellulitis. He cannot always make it to [facility] and is frequently admitted to [facility]. He says he would take the antibiotic while scheduling an appointment to be seen in the clinic. He appears to be in good spirits, and he handles his chronic severe lower extremity swelling well. No improvement with lasix as has been the case in the past. He was prescribed home health for the [facility] boots, and he says he needs those  but does not want home health PT.   - Clindamycin prophylactic prescription given with instructions to schedule an appointment and start taking the antibiotic when the signs of cellulitis arise.  - Will attempt to call the lymphedema clinic at [facility] to see how to get him in there.  - Una boots to the BL lower extremities by [facility] nursing to help with edema.  - Pt. To discontinue PT as he says that he can do most of the exercises himself, and HH pt is too restrictive of his schedule.  - Will f/u in 3 months or as needed otherwise.

## 2014-04-28 DIAGNOSIS — F329 Major depressive disorder, single episode, unspecified: Secondary | ICD-10-CM | POA: Diagnosis not present

## 2014-04-28 DIAGNOSIS — I5020 Unspecified systolic (congestive) heart failure: Secondary | ICD-10-CM | POA: Diagnosis not present

## 2014-04-28 DIAGNOSIS — L03115 Cellulitis of right lower limb: Secondary | ICD-10-CM | POA: Diagnosis not present

## 2014-04-28 DIAGNOSIS — L03116 Cellulitis of left lower limb: Secondary | ICD-10-CM | POA: Diagnosis not present

## 2014-04-28 DIAGNOSIS — G8220 Paraplegia, unspecified: Secondary | ICD-10-CM | POA: Diagnosis not present

## 2014-04-28 DIAGNOSIS — I890 Lymphedema, not elsewhere classified: Secondary | ICD-10-CM | POA: Diagnosis not present

## 2014-05-02 DIAGNOSIS — L03115 Cellulitis of right lower limb: Secondary | ICD-10-CM | POA: Diagnosis not present

## 2014-05-02 DIAGNOSIS — I890 Lymphedema, not elsewhere classified: Secondary | ICD-10-CM | POA: Diagnosis not present

## 2014-05-02 DIAGNOSIS — G8220 Paraplegia, unspecified: Secondary | ICD-10-CM | POA: Diagnosis not present

## 2014-05-02 DIAGNOSIS — L03116 Cellulitis of left lower limb: Secondary | ICD-10-CM | POA: Diagnosis not present

## 2014-05-02 DIAGNOSIS — F329 Major depressive disorder, single episode, unspecified: Secondary | ICD-10-CM | POA: Diagnosis not present

## 2014-05-02 DIAGNOSIS — I5020 Unspecified systolic (congestive) heart failure: Secondary | ICD-10-CM | POA: Diagnosis not present

## 2014-05-05 DIAGNOSIS — F329 Major depressive disorder, single episode, unspecified: Secondary | ICD-10-CM | POA: Diagnosis not present

## 2014-05-05 DIAGNOSIS — G8220 Paraplegia, unspecified: Secondary | ICD-10-CM | POA: Diagnosis not present

## 2014-05-05 DIAGNOSIS — I890 Lymphedema, not elsewhere classified: Secondary | ICD-10-CM | POA: Diagnosis not present

## 2014-05-05 DIAGNOSIS — I5020 Unspecified systolic (congestive) heart failure: Secondary | ICD-10-CM | POA: Diagnosis not present

## 2014-05-05 DIAGNOSIS — L03116 Cellulitis of left lower limb: Secondary | ICD-10-CM | POA: Diagnosis not present

## 2014-05-05 DIAGNOSIS — L03115 Cellulitis of right lower limb: Secondary | ICD-10-CM | POA: Diagnosis not present

## 2014-05-09 DIAGNOSIS — I5020 Unspecified systolic (congestive) heart failure: Secondary | ICD-10-CM | POA: Diagnosis not present

## 2014-05-09 DIAGNOSIS — I890 Lymphedema, not elsewhere classified: Secondary | ICD-10-CM | POA: Diagnosis not present

## 2014-05-09 DIAGNOSIS — L03115 Cellulitis of right lower limb: Secondary | ICD-10-CM | POA: Diagnosis not present

## 2014-05-09 DIAGNOSIS — G8220 Paraplegia, unspecified: Secondary | ICD-10-CM | POA: Diagnosis not present

## 2014-05-09 DIAGNOSIS — L03116 Cellulitis of left lower limb: Secondary | ICD-10-CM | POA: Diagnosis not present

## 2014-05-09 DIAGNOSIS — F329 Major depressive disorder, single episode, unspecified: Secondary | ICD-10-CM | POA: Diagnosis not present

## 2014-05-12 ENCOUNTER — Telehealth: Payer: Self-pay | Admitting: Family Medicine

## 2014-05-12 DIAGNOSIS — G8220 Paraplegia, unspecified: Secondary | ICD-10-CM | POA: Diagnosis not present

## 2014-05-12 DIAGNOSIS — I5020 Unspecified systolic (congestive) heart failure: Secondary | ICD-10-CM | POA: Diagnosis not present

## 2014-05-12 DIAGNOSIS — F329 Major depressive disorder, single episode, unspecified: Secondary | ICD-10-CM | POA: Diagnosis not present

## 2014-05-12 DIAGNOSIS — L03115 Cellulitis of right lower limb: Secondary | ICD-10-CM | POA: Diagnosis not present

## 2014-05-12 DIAGNOSIS — L03116 Cellulitis of left lower limb: Secondary | ICD-10-CM | POA: Diagnosis not present

## 2014-05-12 DIAGNOSIS — I890 Lymphedema, not elsewhere classified: Secondary | ICD-10-CM | POA: Diagnosis not present

## 2014-05-12 NOTE — Telephone Encounter (Signed)
Tabitha from [facility] called and wanted to get verbal orders to see patient 1 time a week now. They are not using the unna boot now and he will be using compression stockings and they would like to monitor this to make sure it doesn't get any worse. Please call her a [phone]. jw

## 2014-05-12 NOTE — Telephone Encounter (Signed)
Called Tabitha from [facility] and got voicemail. Left voicemail verbal order for her to see Mr. Landgren once per week. Told her to call the clinic if they need anything else. Thanks  CGM, MD

## 2014-05-16 ENCOUNTER — Telehealth: Payer: Self-pay | Admitting: Family Medicine

## 2014-05-16 DIAGNOSIS — L03116 Cellulitis of left lower limb: Secondary | ICD-10-CM | POA: Diagnosis not present

## 2014-05-16 DIAGNOSIS — I5020 Unspecified systolic (congestive) heart failure: Secondary | ICD-10-CM | POA: Diagnosis not present

## 2014-05-16 DIAGNOSIS — I890 Lymphedema, not elsewhere classified: Secondary | ICD-10-CM | POA: Diagnosis not present

## 2014-05-16 DIAGNOSIS — Z7409 Other reduced mobility: Secondary | ICD-10-CM

## 2014-05-16 DIAGNOSIS — G8220 Paraplegia, unspecified: Secondary | ICD-10-CM | POA: Diagnosis not present

## 2014-05-16 DIAGNOSIS — F329 Major depressive disorder, single episode, unspecified: Secondary | ICD-10-CM | POA: Diagnosis not present

## 2014-05-16 DIAGNOSIS — L03115 Cellulitis of right lower limb: Secondary | ICD-10-CM | POA: Diagnosis not present

## 2014-05-16 NOTE — Telephone Encounter (Signed)
Pt is calling because one of his crutches broke and he needs a prescription for more sent to [facility] located [address], [place], [place] [address]. Phone: [phone].

## 2014-05-16 NOTE — Telephone Encounter (Signed)
Tabitha with [facility] states that the patient recently got out of the hospital, and needs a referral to [facility] at [facility] [address] [place], [place] [address]. Thanks, [employer], ASA

## 2014-05-17 MED ORDER — CRUTCHES-ALUMINUM MISC: 2.0000 | Freq: Every day | Status: DC

## 2014-05-17 NOTE — Telephone Encounter (Signed)
Prescription sent  CGM MD

## 2014-05-22 ENCOUNTER — Telehealth: Payer: Self-pay | Admitting: *Deleted

## 2014-05-22 NOTE — Telephone Encounter (Signed)
Will forward to MD to have [facility] provider sign for these crutches. Jenie Parish,CMA

## 2014-05-22 NOTE — Telephone Encounter (Signed)
Received a message from Greig Castilla, Pharmacist with [facility] stating they need a provider that is enrolled with [facility] to write Rx for crutches and provide a diagnosis code to bill for medicare. Please give him a call at [phone].  [name], RN

## 2014-05-23 NOTE — Telephone Encounter (Signed)
D. McDiarmid signed Rx for crutches and diagnosis code given.  [name], RN

## 2014-05-25 NOTE — Telephone Encounter (Signed)
Called to refer him over to the lymphedema clinic at [facility]. They requested a referral form and are faxing it over so that we can get him in there. Will fill it out and fax it back as soon as it is available.   CMG MD

## 2014-05-26 DIAGNOSIS — L03115 Cellulitis of right lower limb: Secondary | ICD-10-CM | POA: Diagnosis not present

## 2014-05-26 DIAGNOSIS — I5020 Unspecified systolic (congestive) heart failure: Secondary | ICD-10-CM | POA: Diagnosis not present

## 2014-05-26 DIAGNOSIS — F329 Major depressive disorder, single episode, unspecified: Secondary | ICD-10-CM | POA: Diagnosis not present

## 2014-05-26 DIAGNOSIS — G8220 Paraplegia, unspecified: Secondary | ICD-10-CM | POA: Diagnosis not present

## 2014-05-26 DIAGNOSIS — I890 Lymphedema, not elsewhere classified: Secondary | ICD-10-CM | POA: Diagnosis not present

## 2014-05-26 DIAGNOSIS — L03116 Cellulitis of left lower limb: Secondary | ICD-10-CM | POA: Diagnosis not present

## 2014-05-31 DIAGNOSIS — I890 Lymphedema, not elsewhere classified: Secondary | ICD-10-CM | POA: Diagnosis not present

## 2014-05-31 DIAGNOSIS — L03116 Cellulitis of left lower limb: Secondary | ICD-10-CM | POA: Diagnosis not present

## 2014-05-31 DIAGNOSIS — L03115 Cellulitis of right lower limb: Secondary | ICD-10-CM | POA: Diagnosis not present

## 2014-05-31 DIAGNOSIS — G8220 Paraplegia, unspecified: Secondary | ICD-10-CM | POA: Diagnosis not present

## 2014-05-31 DIAGNOSIS — F329 Major depressive disorder, single episode, unspecified: Secondary | ICD-10-CM | POA: Diagnosis not present

## 2014-05-31 DIAGNOSIS — I5020 Unspecified systolic (congestive) heart failure: Secondary | ICD-10-CM | POA: Diagnosis not present

## 2014-05-31 NOTE — Telephone Encounter (Signed)
Tabitha with [facility] is calling about this and would like to know the status of the situation. Please call and follow up. Thank you, Dorothey Baseman, ASA

## 2014-05-31 NOTE — Telephone Encounter (Signed)
Will forward to MD to see if he received this form. Suha Schoenbeck,CMA

## 2014-06-05 DIAGNOSIS — L03115 Cellulitis of right lower limb: Secondary | ICD-10-CM | POA: Diagnosis not present

## 2014-06-05 DIAGNOSIS — I890 Lymphedema, not elsewhere classified: Secondary | ICD-10-CM | POA: Diagnosis not present

## 2014-06-05 DIAGNOSIS — I5020 Unspecified systolic (congestive) heart failure: Secondary | ICD-10-CM | POA: Diagnosis not present

## 2014-06-05 DIAGNOSIS — F329 Major depressive disorder, single episode, unspecified: Secondary | ICD-10-CM | POA: Diagnosis not present

## 2014-06-05 DIAGNOSIS — L03116 Cellulitis of left lower limb: Secondary | ICD-10-CM | POA: Diagnosis not present

## 2014-06-05 DIAGNOSIS — G8220 Paraplegia, unspecified: Secondary | ICD-10-CM | POA: Diagnosis not present

## 2014-06-06 NOTE — Telephone Encounter (Signed)
Form filled out and placed in fax box.   CGM MD

## 2014-07-07 ENCOUNTER — Ambulatory Visit: Payer: Medicare Other | Admitting: Family Medicine

## 2014-08-07 ENCOUNTER — Ambulatory Visit (INDEPENDENT_AMBULATORY_CARE_PROVIDER_SITE_OTHER): Payer: Medicare Other | Admitting: Family Medicine

## 2014-08-07 VITALS — BP 150/69 | HR 78 | Temp 98.3°F

## 2014-08-07 DIAGNOSIS — R739 Hyperglycemia, unspecified: Secondary | ICD-10-CM

## 2014-08-07 DIAGNOSIS — I5022 Chronic systolic (congestive) heart failure: Secondary | ICD-10-CM

## 2014-08-07 DIAGNOSIS — I890 Lymphedema, not elsewhere classified: Secondary | ICD-10-CM | POA: Diagnosis not present

## 2014-08-07 DIAGNOSIS — I509 Heart failure, unspecified: Secondary | ICD-10-CM | POA: Diagnosis not present

## 2014-08-07 LAB — CBC
HCT: 40.8 % (ref 39.0–52.0)
Hemoglobin: 13.8 g/dL (ref 13.0–17.0)
MCH: 29.2 pg (ref 26.0–34.0)
MCHC: 33.8 g/dL (ref 30.0–36.0)
MCV: 86.3 fL (ref 78.0–100.0)
MPV: 11.4 fL (ref 8.6–12.4)
Platelets: 189 10*3/uL (ref 150–400)
RBC: 4.73 MIL/uL (ref 4.22–5.81)
RDW: 13.4 % (ref 11.5–15.5)
WBC: 8.5 10*3/uL (ref 4.0–10.5)

## 2014-08-07 LAB — COMPREHENSIVE METABOLIC PANEL
ALT: 23 U/L (ref 9–46)
AST: 25 U/L (ref 10–40)
Albumin: 4.2 g/dL (ref 3.6–5.1)
Alkaline Phosphatase: 53 U/L (ref 40–115)
BUN: 8 mg/dL (ref 7–25)
CALCIUM: 9.5 mg/dL (ref 8.6–10.3)
CO2: 28 mmol/L (ref 20–31)
Chloride: 101 mmol/L (ref 98–110)
Creat: 1.03 mg/dL (ref 0.60–1.35)
Glucose, Bld: 91 mg/dL (ref 65–99)
Potassium: 4 mmol/L (ref 3.5–5.3)
Sodium: 139 mmol/L (ref 135–146)
Total Bilirubin: 0.8 mg/dL (ref 0.2–1.2)
Total Protein: 7.2 g/dL (ref 6.1–8.1)

## 2014-08-07 LAB — POCT GLYCOSYLATED HEMOGLOBIN (HGB A1C): HEMOGLOBIN A1C: 5.3

## 2014-08-07 MED ORDER — LOSARTAN POTASSIUM 25 MG PO TABS: 12.5000 mg | ORAL_TABLET | Freq: Every day | ORAL | Status: DC

## 2014-08-07 NOTE — Patient Instructions (Addendum)
Thanks for coming in today.   I will call you with the results of your blood work.   You will take 1/2 tablet daily of the new blood pressure medicine. If you feel bad, or if your blood pressure gets too low, then you may stop it and return for an office visit to discuss other options.   If you experience any worsening shortness of breath, palpitations, or worse swelling, then return for evaluation.   We will try to get you in to physical therapy in [place].   If nothing comes up, we can see you back in 6 months for follow up of your heart failure and lymphedema.   Thanks for letting us take care of you.   Sincerely,  Devota Pace, MD Family Medicine - PGY 2

## 2014-08-08 NOTE — Progress Notes (Signed)
Patient ID: Jordan Jensen, male   DOB: 20-Oct-1970, 44 y.o.   MRN: [id number]   [facility] Yolande Jolly, MD Phone: [phone]  Subjective:   # Lymphedema - Pt. Is a 44 y/o M here for follow up of his ongoing lymphedema.  - This is a combined issue with CHF rEF NYH II-III.  - He says that his swelling is about the same, but that his left leg often swells up more than the right whenever he gets out in the heat.  - He continues to remain wheel chair bound.  - He has been trying to get in to the [facility], but has not yet.  - We continue to have issues getting him in to the lymphedema clinic at [place]. He is agreeable to going to [place] for this.  - He has not had any infection / ulceration recently.  - He still has the script for clindamycin in case he does get infected.  - Strongly desires to become more active and lose more weight.  - Feels as though his life is getting better now that he has finished a divorce settlement with his x-wife and he can focus on being more healthy.   # CHF rEF.  - Pt. Without worsening SOB - He has lower extremity edema due to lymphedema at baseline, but this has not been worse than normal.  - He does not get palpitations, chest pain with exertion, lightheadedness, or orthopnea.  - He says he feels better than he has in some time.  - He only takes his lasix prn.  - He is on a beta blocker but no ARB / ACE - has had cough reaction to ACE previously.  - Has seen cardiology in the past, though he does not have scheduled follow up with them.  - Tolerating his medications well.   All relevant systems were reviewed and were negative unless otherwise noted in the HPI  Past Medical History Reviewed problem list.  Medications- reviewed and updated Current Outpatient Prescriptions  Medication Sig Dispense Refill  . aspirin EC 81 MG EC tablet Take 1 tablet (81 mg total) by mouth daily. 30 tablet 1  . famotidine (PEPCID) 20 MG tablet  Take 1 tablet (20 mg total) by mouth daily. 30 tablet 1  . furosemide (LASIX) 20 MG tablet Take 2 tablets (40 mg total) by mouth daily. (Patient taking differently: Take 40 mg by mouth daily as needed for edema. ) 60 tablet 0  . guaifenesin (ROBITUSSIN) 100 MG/5ML syrup Take 10 mLs (200 mg total) by mouth every 4 (four) hours as needed for congestion. (Patient not taking: Reported on 04/08/2014) 120 mL 0  . ibuprofen (ADVIL,MOTRIN) 200 MG tablet Take 1 tablet (200 mg total) by mouth every 6 (six) hours as needed for mild pain. 30 tablet 2  . losartan (COZAAR) 25 MG tablet Take 0.5 tablets (12.5 mg total) by mouth daily. 90 tablet 5  . metoprolol succinate (TOPROL-XL) 25 MG 24 hr tablet Take 0.5 tablets (12.5 mg total) by mouth daily. 30 tablet 0  . Misc. Devices (CRUTCHES-ALUMINUM) MISC 2 each by Does not apply route daily. 2 each 0   No current facility-administered medications for this visit.   Chief complaint-noted No additions to family history Social history- patient is a non smoker  Objective: BP 150/69 mmHg  Pulse 78  Temp(Src) 98.3 F (36.8 C) (Oral)  Ht   Wt  Gen: NAD, alert, cooperative with exam, morbidly obese, wheelchair  bound.  HEENT: NCAT, EOMI, PERRL Neck: FROM, supple CV: RRR, good S1/S2, no murmur Resp: CTABL, no wheezes, non-labored Abd: SNTND, BS present, no guarding or organomegaly Ext: Prolific lower extremity edema ( this is near baseline / what it was when I saw him last), compression socks in place on arrival, no evidence of cellulitis or skin breakdown at this time, chronic skin changes 2/2 chronic lymphedema, warm, normal tone, able to move UE/LE spontaneously.  Neuro: Alert and oriented, No gross deficits Skin: no rashes no lesions, no evidence of cellulitis of his lower extremities, no ulceration at this time. Chronic skin changes as above.   Assessment/Plan: See problem based a/p

## 2014-08-10 ENCOUNTER — Encounter: Payer: Self-pay | Admitting: Family Medicine

## 2014-08-10 NOTE — Assessment & Plan Note (Signed)
Pt. With CHF II-III. He has mildly reduced EF on his last echocardiogram. Symptoms mostly related to obesity and lymphedema. He is tolerating his medicines well. No worsening lower extremity edema, and no orthopnea, or worsening SOB. No palpitations or chest pain.   - Continue current therapy.  - Lasix prn - placed on low dose arb as his blood pressure is slightly high and he has not been on this previously.  - On metoprolol already.  - Will follow up as needed.  - Has seen cardiology last year.

## 2014-08-10 NOTE — Assessment & Plan Note (Signed)
Pt. With ongoing lymphedema. It is not worse, but definitely not better. He is wearing his compression stockings today. We have tried multiple times to get him in to the Lymphedema clinic at [facility] due to its proximity to his house. However, this has still not worked out. He is open to trying the clinic at [facility] in [place]. He is in good spirits and is open to trying anything. He feels that he is at his baseline. No cellulitis or ulcerations at this time.   - Continue lasix prn mostly for CHF.  - Continue compression stockings.  - Referral to pt / lymphedema clinic in [place] placed.  - No infection. He does have the spare script of clindamycin in case he develops cellulitis since he has had it so many times and has been hospitalized due to inability to get to the hospital / clinic in time to deal with the infection. We have discussed this and he knows when to use the antibiotic if needed.  - Will see him back prn.

## 2014-08-24 ENCOUNTER — Ambulatory Visit (HOSPITAL_COMMUNITY): Payer: Medicare Other | Admitting: Physical Therapy

## 2014-08-31 ENCOUNTER — Encounter: Payer: Self-pay | Admitting: Family Medicine

## 2014-08-31 ENCOUNTER — Ambulatory Visit (INDEPENDENT_AMBULATORY_CARE_PROVIDER_SITE_OTHER): Payer: Medicare Other | Admitting: Family Medicine

## 2014-08-31 VITALS — BP 130/69 | HR 89 | Temp 98.6°F | Ht 71.0 in | Wt 371.0 lb

## 2014-08-31 DIAGNOSIS — L03115 Cellulitis of right lower limb: Secondary | ICD-10-CM | POA: Diagnosis not present

## 2014-08-31 DIAGNOSIS — M5442 Lumbago with sciatica, left side: Secondary | ICD-10-CM

## 2014-08-31 DIAGNOSIS — M5441 Lumbago with sciatica, right side: Secondary | ICD-10-CM

## 2014-08-31 DIAGNOSIS — I890 Lymphedema, not elsewhere classified: Secondary | ICD-10-CM

## 2014-08-31 MED ORDER — CLINDAMYCIN HCL 300 MG PO CAPS: 300.0000 mg | ORAL_CAPSULE | Freq: Four times a day (QID) | ORAL | Status: DC
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
## 2014-08-31 NOTE — Patient Instructions (Signed)
Thanks for coming in today.   We will refill the clindamycin. Take this if you get an area of infection. I'm glad that we avoided an ED visit.   We will get an MRI of your lumbar spine. I'll call you with these results. You will be notified of your appointment.   Start physical therapy as planned. I suspect that this will help with your back pain as well.   Thanks for letting us take care of you! I'm glad that you are doing well.   Sincerely,  Devota Pace, MD Family Medicine - PGY 2
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
## 2014-08-31 NOTE — Assessment & Plan Note (Signed)
Pt. With chronic lymphedema prone to cellulitis. He did injure his left lower extremity nearly a week ago, and it sounds as though it was beginning to become infected. He took the clindamycin prescription that we had provided, and the wound is healing up nicely and his infection cleared up. This is a win for him, as he has previously had to go to the ED with bad cellulitis before and typically gets admitted. He lives a good distance from the hospital and sometimes waits until his cellulitis is severe before coming for evaluation. He is also going to start going to lymphedema clinic on 9/2.  - refill clindamycin script.  - follow up with physical therapy as directed.  - Will see him back in 3 months for follow up .

## 2014-09-07 ENCOUNTER — Ambulatory Visit (HOSPITAL_COMMUNITY)
Admission: RE | Admit: 2014-09-07 | Discharge: 2014-09-07 | Disposition: A | Discharge status: Home / Self Care | Payer: Medicare Other | Source: Ambulatory Visit | Attending: Family Medicine | Admitting: Family Medicine

## 2014-09-07 ENCOUNTER — Other Ambulatory Visit: Payer: Self-pay | Admitting: Family Medicine

## 2014-09-07 DIAGNOSIS — M4806 Spinal stenosis, lumbar region: Secondary | ICD-10-CM | POA: Diagnosis not present

## 2014-09-07 DIAGNOSIS — M5136 Other intervertebral disc degeneration, lumbar region: Secondary | ICD-10-CM | POA: Diagnosis not present

## 2014-09-07 DIAGNOSIS — L03115 Cellulitis of right lower limb: Secondary | ICD-10-CM

## 2014-09-07 DIAGNOSIS — M5126 Other intervertebral disc displacement, lumbar region: Secondary | ICD-10-CM | POA: Insufficient documentation

## 2014-09-07 DIAGNOSIS — M5442 Lumbago with sciatica, left side: Secondary | ICD-10-CM

## 2014-09-07 DIAGNOSIS — M545 Low back pain: Secondary | ICD-10-CM | POA: Diagnosis present

## 2014-09-08 ENCOUNTER — Telehealth: Payer: Self-pay | Admitting: Family Medicine

## 2014-09-08 ENCOUNTER — Ambulatory Visit (HOSPITAL_COMMUNITY): Payer: Medicare Other | Attending: Family Medicine | Admitting: Physical Therapy

## 2014-09-08 DIAGNOSIS — M48062 Spinal stenosis, lumbar region with neurogenic claudication: Secondary | ICD-10-CM

## 2014-09-08 NOTE — Telephone Encounter (Signed)
I reviewed the MRI that we recently ordered for Jordan Jensen. I discussed with him over the phone, and we will refer for evaluation by a spine specialist to weigh in on options for him. He is already doing physical therapy which I expect will be a mainstay of treatment. It is very unlikely that he is a surgical candidate, but we will have surgery to weigh in and give their opinion on further management. I explained all of this to Jordan Jensen who agrees with this plan.   CGM MD

## 2014-09-13 ENCOUNTER — Ambulatory Visit (HOSPITAL_COMMUNITY): Payer: Medicare Other | Admitting: Physical Therapy

## 2014-09-13 ENCOUNTER — Encounter (HOSPITAL_COMMUNITY): Payer: Self-pay | Admitting: Specialist

## 2014-09-13 ENCOUNTER — Encounter (HOSPITAL_COMMUNITY): Payer: Self-pay | Admitting: Physical Therapy

## 2014-09-13 NOTE — Therapy (Signed)
[facility] [address] [place], [place], [address] Phone: [phone]   Fax:  [phone]  Patient Details  Name: Jordan Jensen MRN: [id number] Date of Birth: 1970-02-18 Referring Provider:  Jaquita Rector  Encounter Date: 09/13/2014   Thank you for the referral of your pt. Mr. [name] to our clinic for B lymphedema.  Unfortunately [name] has been scheduled three times and continues not to show.  Due to the complexity of lymphedema with wounds we have been scheduling [name]  in a two evaluation slot which consists of ninety minutes.  We are at a high census at this time and are not able to see other patients who require skilled therapy.  Due to the above reasons we will not, at this time, be able to reschedule your patient.  If you have any further questions please feel free to call me.   Virgina Organ, PT CLT [phone] 09/13/2014, 8:58 AM [facility] [address] [place], [place], [address] Phone: [phone]   Fax:  [phone]

## 2014-09-18 ENCOUNTER — Encounter (HOSPITAL_COMMUNITY): Payer: Medicare Other | Admitting: Physical Therapy

## 2014-09-20 ENCOUNTER — Encounter (HOSPITAL_COMMUNITY): Payer: Medicare Other | Admitting: Physical Therapy

## 2014-09-22 ENCOUNTER — Encounter (HOSPITAL_COMMUNITY): Payer: Medicare Other | Admitting: Physical Therapy

## 2014-09-25 ENCOUNTER — Encounter (HOSPITAL_COMMUNITY): Payer: Medicare Other | Admitting: Physical Therapy

## 2014-09-26 DIAGNOSIS — M5126 Other intervertebral disc displacement, lumbar region: Secondary | ICD-10-CM | POA: Diagnosis not present

## 2014-09-26 DIAGNOSIS — Z6841 Body Mass Index (BMI) 40.0 and over, adult: Secondary | ICD-10-CM | POA: Diagnosis not present

## 2014-09-27 ENCOUNTER — Encounter (HOSPITAL_COMMUNITY): Payer: Medicare Other | Admitting: Physical Therapy

## 2014-10-02 ENCOUNTER — Encounter (HOSPITAL_COMMUNITY): Payer: Medicare Other | Admitting: Physical Therapy

## 2014-10-04 ENCOUNTER — Encounter (HOSPITAL_COMMUNITY): Payer: Medicare Other | Admitting: Physical Therapy

## 2014-10-06 ENCOUNTER — Encounter (HOSPITAL_COMMUNITY): Payer: Medicare Other | Admitting: Physical Therapy

## 2014-12-08 ENCOUNTER — Ambulatory Visit (INDEPENDENT_AMBULATORY_CARE_PROVIDER_SITE_OTHER): Payer: Commercial Managed Care - HMO | Admitting: Family Medicine

## 2014-12-08 VITALS — BP 143/72 | HR 76 | Temp 98.3°F | Ht 71.0 in | Wt 372.0 lb

## 2014-12-08 DIAGNOSIS — Z23 Encounter for immunization: Secondary | ICD-10-CM | POA: Diagnosis not present

## 2014-12-08 DIAGNOSIS — G8929 Other chronic pain: Secondary | ICD-10-CM

## 2014-12-08 DIAGNOSIS — Z993 Dependence on wheelchair: Secondary | ICD-10-CM

## 2014-12-08 DIAGNOSIS — M7711 Lateral epicondylitis, right elbow: Secondary | ICD-10-CM | POA: Diagnosis not present

## 2014-12-08 DIAGNOSIS — M5441 Lumbago with sciatica, right side: Secondary | ICD-10-CM | POA: Diagnosis not present

## 2014-12-08 DIAGNOSIS — M5442 Lumbago with sciatica, left side: Secondary | ICD-10-CM

## 2014-12-08 MED ORDER — MELOXICAM 7.5 MG PO TABS: 7.5000 mg | ORAL_TABLET | Freq: Every day | ORAL | Status: DC

## 2014-12-08 MED ORDER — TENNIS ELBOW NEOPRENE BRACE MISC: 1.0000 "application " | Freq: Every day | Status: DC

## 2014-12-08 NOTE — Patient Instructions (Signed)
Thanks for coming in today.   Make a follow up appointment with the spinal surgeon to discuss getting the epidural shots, and ongoing management of your back pain.   We will start mobic today.   We will send a prescription for a shower chair for you. Call us if you have any issue getting this done.   Get the arm brace for tennis elbow from your local pharmacy.   Thanks for letting us take care of you.   Sincerely,  Devota Pace, MD Family Medicine - PGY 2

## 2014-12-11 ENCOUNTER — Telehealth: Payer: Self-pay | Admitting: Family Medicine

## 2014-12-11 DIAGNOSIS — G9589 Other specified diseases of spinal cord: Secondary | ICD-10-CM

## 2014-12-11 NOTE — Telephone Encounter (Signed)
Will forward to MD. Jazmin Hartsell,CMA  

## 2014-12-11 NOTE — Progress Notes (Signed)
Patient ID: Jordan Jensen, male   DOB: 23-Apr-1970, 44 y.o.   MRN: [id number]   [facility] Yolande Jolly, MD Phone: [phone]  Subjective:   # F/U LE Lymphedema - pt. Doing well, swelling is at baseline and well controlled - no interval infection, has not had to use the clindamycin script that I had given him.  - no issues otherwise - of note, he did not go to the lymphedema clinic appointment with pt scheduled for him. He says that it was too difficult with transportation because they scheduled him during the day and did not have any appointments for him in the evenings when his daughters could take him.   # F/U  Low Back Pain - pt. Continues to have low back pain similar to before.  -  He has seen the spinal surgeon who recommended conservative therapy first prior to intervention.  - tried prednisone which made him sick. He also stopped taking ibuprofen which was helping before.  - he is also recommending epidural injection. We had a long discussion about the benefits and downsides of epidural injection.  - Mr. Emmanuel also had many questions about minimally invasive spinal surgery for which I directed him back to his surgeon though we did discuss this to some extent. - He is open to the idea of surgery, but does not want to be worse off than he is now. He is understandably apprehensive.  - otherwise, no red flag symptoms i.e. Loss of bowel or bladder function, no new numbness, tingling, or weakness since i saw him last.   All relevant systems were reviewed and were negative unless otherwise noted in the HPI  Past Medical History Reviewed problem list.  Medications- reviewed and updated Current Outpatient Prescriptions  Medication Sig Dispense Refill  . aspirin EC 81 MG EC tablet Take 1 tablet (81 mg total) by mouth daily. 30 tablet 1  . clindamycin (CLEOCIN) 300 MG capsule Take 1 capsule (300 mg total) by mouth 4 (four) times daily. Take if you develop  symptoms of cellulitis 28 capsule 2  . Elastic Bandages & Supports (TENNIS ELBOW NEOPRENE BRACE) MISC 1 application by Does not apply route daily. 1 each 0  . famotidine (PEPCID) 20 MG tablet Take 1 tablet (20 mg total) by mouth daily. 30 tablet 1  . furosemide (LASIX) 20 MG tablet Take 2 tablets (40 mg total) by mouth daily. (Patient taking differently: Take 40 mg by mouth daily as needed for edema. ) 60 tablet 0  . guaifenesin (ROBITUSSIN) 100 MG/5ML syrup Take 10 mLs (200 mg total) by mouth every 4 (four) hours as needed for congestion. (Patient not taking: Reported on 04/08/2014) 120 mL 0  . ibuprofen (ADVIL,MOTRIN) 200 MG tablet Take 1 tablet (200 mg total) by mouth every 6 (six) hours as needed for mild pain. 30 tablet 2  . losartan (COZAAR) 25 MG tablet Take 0.5 tablets (12.5 mg total) by mouth daily. 90 tablet 5  . meloxicam (MOBIC) 7.5 MG tablet Take 1 tablet (7.5 mg total) by mouth daily. 30 tablet 3  . metoprolol succinate (TOPROL-XL) 25 MG 24 hr tablet Take 0.5 tablets (12.5 mg total) by mouth daily. 30 tablet 0  . Misc. Devices (CRUTCHES-ALUMINUM) MISC 2 each by Does not apply route daily. 2 each 0   No current facility-administered medications for this visit.   Chief complaint-noted No additions to family history Social history- patient is a non smoker  Objective: BP 143/72 mmHg  Pulse 76  Temp(Src) 98.3 F (36.8 C) (Oral)  Ht 5\' 11"  (1.803 m)  Wt 372 lb (168.738 kg)  BMI 51.91 kg/m2 Gen: NAD, alert, cooperative with exam HEENT: NCAT, EOMI, PERRL Neck: FROM, supple CV: RRR, good S1/S2, no murmur Resp: CTABL, no wheezes, non-labored Abd: SNTND, BS present, no guarding or organomegaly Ext: No edema, warm, normal tone, moves UE/LE spontaneously, severe LE lymphedema BL, skin changes consistent with chronic lymphedema, no infection. Low back pain reproducible with palpation, but also worse with position particularly standing.  Neuro: Alert and oriented, No gross  deficits Skin: no rashes no lesions  Assessment/Plan:  # Lymphedema -  - continue home management - pt. Does not want to reschedule for lymphedema clinic - home ppx clindamycin per last script sent with him.   # Low Back Pain  - pt. Following with spinal surgery.  - plan for conservative management for now.  - pt. Had stopped ibuprofen. Restarted on mobic.  - f/u with spinal surgery as needed with plan for epidural injection pending further discussion with his surgeon.  - return prn.   # Pt. Needs - pt. Needs shower chair due to severe lymphedema, limited mobility, and debilitating back pain.  - will send rx to [facility]

## 2014-12-11 NOTE — Progress Notes (Signed)
Note and order printed and faxed to [facility]. Burnard Hawthorne

## 2014-12-11 NOTE — Telephone Encounter (Signed)
Pt called because the doctor needs to write a new prescription for a bariatric shower chair. Please fax this to [facility]. jw

## 2014-12-12 NOTE — Telephone Encounter (Signed)
Spoke with [facility] and new order needed to say "heavy duty shower chair."  New order printed and faxed to [facility]. Jazmin Hartsell,CMA

## 2014-12-12 NOTE — Telephone Encounter (Signed)
I placed a new rx for shower chair with his last visit. Please fax prescription to [facility]. Thanks  CGM MD

## 2014-12-14 DIAGNOSIS — H5213 Myopia, bilateral: Secondary | ICD-10-CM | POA: Diagnosis not present

## 2014-12-14 DIAGNOSIS — H5210 Myopia, unspecified eye: Secondary | ICD-10-CM | POA: Diagnosis not present

## 2014-12-14 NOTE — Telephone Encounter (Signed)
Thanks for taking care of this.   CGM MD

## 2015-01-24 ENCOUNTER — Ambulatory Visit: Payer: Commercial Managed Care - HMO | Admitting: Family Medicine

## 2015-01-25 ENCOUNTER — Ambulatory Visit: Payer: Commercial Managed Care - HMO | Admitting: Family Medicine

## 2015-01-26 ENCOUNTER — Encounter: Payer: Self-pay | Admitting: Family Medicine

## 2015-01-26 ENCOUNTER — Ambulatory Visit (INDEPENDENT_AMBULATORY_CARE_PROVIDER_SITE_OTHER): Payer: Commercial Managed Care - HMO | Admitting: Family Medicine

## 2015-01-26 VITALS — BP 125/70 | HR 92 | Temp 98.3°F

## 2015-01-26 DIAGNOSIS — Z23 Encounter for immunization: Secondary | ICD-10-CM

## 2015-01-26 DIAGNOSIS — B9789 Other viral agents as the cause of diseases classified elsewhere: Principal | ICD-10-CM

## 2015-01-26 DIAGNOSIS — J069 Acute upper respiratory infection, unspecified: Secondary | ICD-10-CM | POA: Diagnosis not present

## 2015-01-26 MED ORDER — BENZONATATE 200 MG PO CAPS: 200.0000 mg | ORAL_CAPSULE | Freq: Three times a day (TID) | ORAL | Status: DC | PRN

## 2015-01-26 MED ORDER — OXYMETAZOLINE HCL 0.05 % NA SOLN: 1.0000 | Freq: Two times a day (BID) | NASAL | Status: DC

## 2015-01-26 NOTE — Patient Instructions (Signed)

## 2015-01-26 NOTE — Progress Notes (Signed)
    Subjective:  Jordan Jensen is a 45 y.o. male who presents to the [facility] today with a chief complaint of cough.   HPI:  Cough. Present for the past week. Associated with rhinorrhea, sneezing and chest congestion. Has tried mucinex and cough syrup without significant relief. No fevers or chills. No sick contacts. Cough worse at night. No shortness of breath or chest pain.  ROS: Per HPI  Objective:  Physical Exam: There were no vitals taken for this visit.  Gen: NAD, resting comfortably HEENT: TMs clear bilaterally. OP clear. No LAD. Maxillary sinuses transilluminate. CV: RRR with no murmurs appreciated Pulm: NWOB, CTAB with no crackles, wheezes, or rhonchi Skin: warm, dry Neuro: grossly normal, moves all extremities Psych: Normal affect and thought content  Assessment/Plan:  Cough Likely viral URI. No signs of bacterial infection. Will treat symptomatically with afrin nasal spray and tessalon. Return precautions reviewed.   Katina Degree. Jimmey Ralph, MD [facility] Family Medicine Resident PGY-2 01/26/2015 11:10 AM

## 2015-02-19 ENCOUNTER — Other Ambulatory Visit: Payer: Self-pay | Admitting: Family Medicine

## 2015-06-13 ENCOUNTER — Ambulatory Visit (INDEPENDENT_AMBULATORY_CARE_PROVIDER_SITE_OTHER): Payer: Commercial Managed Care - HMO | Admitting: Family Medicine

## 2015-06-13 ENCOUNTER — Ambulatory Visit: Payer: Commercial Managed Care - HMO | Admitting: Family Medicine

## 2015-06-13 ENCOUNTER — Encounter: Payer: Self-pay | Admitting: Family Medicine

## 2015-06-13 VITALS — BP 138/86 | HR 101 | Temp 98.6°F

## 2015-06-13 DIAGNOSIS — L03119 Cellulitis of unspecified part of limb: Secondary | ICD-10-CM

## 2015-06-13 DIAGNOSIS — R8290 Unspecified abnormal findings in urine: Secondary | ICD-10-CM | POA: Insufficient documentation

## 2015-06-13 DIAGNOSIS — Z20828 Contact with and (suspected) exposure to other viral communicable diseases: Secondary | ICD-10-CM | POA: Diagnosis not present

## 2015-06-13 DIAGNOSIS — M7989 Other specified soft tissue disorders: Secondary | ICD-10-CM | POA: Diagnosis not present

## 2015-06-13 LAB — POCT URINALYSIS DIPSTICK
Bilirubin, UA: NEGATIVE
GLUCOSE UA: NEGATIVE
Leukocytes, UA: NEGATIVE
NITRITE UA: NEGATIVE
PH UA: 8
PROTEIN UA: 30
RBC UA: NEGATIVE
Spec Grav, UA: 1.02
UROBILINOGEN UA: 1

## 2015-06-13 MED ORDER — METOPROLOL SUCCINATE ER 25 MG PO TB24: 12.5000 mg | ORAL_TABLET | Freq: Every day | ORAL | Status: DC

## 2015-06-13 NOTE — Progress Notes (Signed)
HPI  CC: Left foot abrasion Patient is here for follow-up on his left foot abrasion. He states that he was previously prescribed oral clindamycin for a left foot/toe abrasion. He states that he has had an extensive history of cellulitis of both legs due to chronic venous insufficiency and that he frequently needs antibiotics. Patient states that since initiation of antibiotics for this abrasion he has had significantly improved wound healing. He denies any drainage/purulence to the area. There is no longer tender or red. He denies any fevers, chills, nausea, vomiting, diarrhea, numbness, paresthesias, or weakness.  Patient does endorse some abnormal urine odor. He states that this began about 1 week ago. He says that it has gradually worsened but he has no dysuria, hesitancy, frequency, or urgency. His urine has been darker than normal and is concerned that he may have infection as he has had a urinary tract infection the past. He denies penile discharge.  Review of Systems   See HPI for ROS. All other systems reviewed and are negative.  CC, SH/smoking status, and VS noted  Objective: BP 138/86 mmHg  Pulse 101  Temp(Src) 98.6 F (37 C) (Oral)  Ht   Wt   SpO2 97% Gen: NAD, alert, cooperative, and pleasant. CV: RRR, no murmur Ext: Significant bilateral lower extremity edema. Compression hose in place. Pulses intact bilaterally. Chronic venous stasis changes noted bilaterally 8 cm proximal to the lateral malleolus. Healing wound noted on the medial surface of the second left toe. No evidence of drainage/purulence, erythema, or fluctuance. No other evidence of skin breakdown.  Assessment and plan:  Recurrent cellulitis of lower leg Improved: Patient has had recurrent episodes of cellulitis in his legs bilaterally. He was recently treated with clindamycin due to an abrasion he suffered on his left second digit. Wound is present but appears to be healing nicely. No evidence of infection at  this time. - Continue compression stockings. - Elevate lower extremities above the hips/heart when able  Abnormal urine odor Patient endorses some abnormal urine odor over the past 1 week. He also states that he has had more concentrated urine during that time. Urinalysis yielded 30 protein and trace ketones. No evidence of infection. I feel that there is relatively low concern for pathology at this time however with the patient's lower extremity edema and evidence of proteinuria I will obtain additional labs. - CBC, TSH, and CMP (lab was closed for the day so patient was informed to come back anytime this week during regular business hours to have these labs drawn.) - If additional abnormalities are found would strongly consider 24 hour urine protein.    Orders Placed This Encounter  Procedures  . CBC    Standing Status: Future     Number of Occurrences:      Standing Expiration Date: 06/12/2016  . COMPLETE METABOLIC PANEL WITH GFR    Standing Status: Future     Number of Occurrences:      Standing Expiration Date: 06/12/2016  . HIV antibody    Standing Status: Future     Number of Occurrences:      Standing Expiration Date: 06/12/2016  . Lipid panel    Standing Status: Future     Number of Occurrences:      Standing Expiration Date: 06/12/2016  . TSH    Standing Status: Future     Number of Occurrences:      Standing Expiration Date: 06/12/2016  . POCT urinalysis dipstick    Meds ordered  this encounter  Medications  . metoprolol succinate (TOPROL-XL) 25 MG 24 hr tablet    Sig: Take 0.5 tablets (12.5 mg total) by mouth daily.    Dispense:  30 tablet    Refill:  5     Kathee Delton, MD,MS,  PGY2 06/13/2015 5:25 PM

## 2015-06-13 NOTE — Assessment & Plan Note (Addendum)
Patient endorses some abnormal urine odor over the past 1 week. He also states that he has had more concentrated urine during that time. Urinalysis yielded 30 protein and trace ketones. No evidence of infection. I feel that there is relatively low concern for pathology at this time however with the patient's lower extremity edema and evidence of proteinuria I will obtain additional labs. - CBC, TSH, and CMP (lab was closed for the day so patient was informed to come back anytime this week during regular business hours to have these labs drawn.) - If additional abnormalities are found would strongly consider 24 hour urine protein.

## 2015-06-13 NOTE — Assessment & Plan Note (Signed)
Improved: Patient has had recurrent episodes of cellulitis in his legs bilaterally. He was recently treated with clindamycin due to an abrasion he suffered on his left second digit. Wound is present but appears to be healing nicely. No evidence of infection at this time. - Continue compression stockings. - Elevate lower extremities above the hips/heart when able

## 2015-06-13 NOTE — Patient Instructions (Signed)
It was a pleasure seeing you today in our clinic. Today we discussed your foot wound and issues with your urine. Here is the treatment plan we have discussed and agreed upon together:   - I'm very pleased with how the wound on your left foot seems to be healing. Continue using the compression stockings and do your best to elevate your legs while at home. - Your urine looked okay today. There is no sign of infection. I would, however, like you to obtain some blood labs. Unfortunately today the lab is now closed. I placed orders for future labs to be taken. Please stop by our office to have these drawn at any point during normal business hours.

## 2015-09-07 ENCOUNTER — Emergency Department (HOSPITAL_COMMUNITY)
Admission: EM | Admit: 2015-09-07 | Discharge: 2015-09-08 | Disposition: A | Discharge status: Home / Self Care | Payer: Commercial Managed Care - HMO | Source: Home / Self Care | Attending: Emergency Medicine | Admitting: Emergency Medicine

## 2015-09-07 ENCOUNTER — Encounter (HOSPITAL_COMMUNITY): Payer: Self-pay | Admitting: Emergency Medicine

## 2015-09-07 ENCOUNTER — Emergency Department (HOSPITAL_COMMUNITY): Payer: Commercial Managed Care - HMO

## 2015-09-07 DIAGNOSIS — I509 Heart failure, unspecified: Secondary | ICD-10-CM

## 2015-09-07 DIAGNOSIS — Z87891 Personal history of nicotine dependence: Secondary | ICD-10-CM

## 2015-09-07 DIAGNOSIS — Z9889 Other specified postprocedural states: Secondary | ICD-10-CM | POA: Diagnosis not present

## 2015-09-07 DIAGNOSIS — K449 Diaphragmatic hernia without obstruction or gangrene: Secondary | ICD-10-CM | POA: Diagnosis not present

## 2015-09-07 DIAGNOSIS — M545 Low back pain: Secondary | ICD-10-CM

## 2015-09-07 DIAGNOSIS — I878 Other specified disorders of veins: Secondary | ICD-10-CM | POA: Diagnosis present

## 2015-09-07 DIAGNOSIS — R509 Fever, unspecified: Secondary | ICD-10-CM | POA: Diagnosis not present

## 2015-09-07 DIAGNOSIS — E669 Obesity, unspecified: Secondary | ICD-10-CM | POA: Diagnosis present

## 2015-09-07 DIAGNOSIS — A419 Sepsis, unspecified organism: Secondary | ICD-10-CM | POA: Diagnosis not present

## 2015-09-07 DIAGNOSIS — Z993 Dependence on wheelchair: Secondary | ICD-10-CM | POA: Diagnosis not present

## 2015-09-07 DIAGNOSIS — M5440 Lumbago with sciatica, unspecified side: Secondary | ICD-10-CM | POA: Insufficient documentation

## 2015-09-07 DIAGNOSIS — R0602 Shortness of breath: Secondary | ICD-10-CM | POA: Diagnosis not present

## 2015-09-07 DIAGNOSIS — L0390 Cellulitis, unspecified: Secondary | ICD-10-CM | POA: Diagnosis not present

## 2015-09-07 DIAGNOSIS — R109 Unspecified abdominal pain: Secondary | ICD-10-CM

## 2015-09-07 DIAGNOSIS — I9589 Other hypotension: Secondary | ICD-10-CM | POA: Diagnosis present

## 2015-09-07 DIAGNOSIS — Z981 Arthrodesis status: Secondary | ICD-10-CM | POA: Diagnosis not present

## 2015-09-07 DIAGNOSIS — I890 Lymphedema, not elsewhere classified: Secondary | ICD-10-CM | POA: Diagnosis present

## 2015-09-07 DIAGNOSIS — Z96649 Presence of unspecified artificial hip joint: Secondary | ICD-10-CM | POA: Diagnosis present

## 2015-09-07 DIAGNOSIS — Z91040 Latex allergy status: Secondary | ICD-10-CM | POA: Diagnosis not present

## 2015-09-07 DIAGNOSIS — I110 Hypertensive heart disease with heart failure: Secondary | ICD-10-CM | POA: Insufficient documentation

## 2015-09-07 DIAGNOSIS — Z6841 Body Mass Index (BMI) 40.0 and over, adult: Secondary | ICD-10-CM | POA: Diagnosis not present

## 2015-09-07 DIAGNOSIS — Z79899 Other long term (current) drug therapy: Secondary | ICD-10-CM | POA: Insufficient documentation

## 2015-09-07 DIAGNOSIS — M5489 Other dorsalgia: Secondary | ICD-10-CM | POA: Diagnosis not present

## 2015-09-07 DIAGNOSIS — R300 Dysuria: Secondary | ICD-10-CM

## 2015-09-07 DIAGNOSIS — Z7982 Long term (current) use of aspirin: Secondary | ICD-10-CM

## 2015-09-07 DIAGNOSIS — I429 Cardiomyopathy, unspecified: Secondary | ICD-10-CM | POA: Diagnosis not present

## 2015-09-07 DIAGNOSIS — R079 Chest pain, unspecified: Secondary | ICD-10-CM

## 2015-09-07 DIAGNOSIS — L03116 Cellulitis of left lower limb: Secondary | ICD-10-CM | POA: Diagnosis not present

## 2015-09-07 LAB — URINALYSIS, ROUTINE W REFLEX MICROSCOPIC
Bilirubin Urine: NEGATIVE
Glucose, UA: NEGATIVE mg/dL
LEUKOCYTES UA: NEGATIVE
NITRITE: NEGATIVE
PH: 6 (ref 5.0–8.0)
Protein, ur: NEGATIVE mg/dL
SPECIFIC GRAVITY, URINE: 1.025 (ref 1.005–1.030)

## 2015-09-07 LAB — CBC WITH DIFFERENTIAL/PLATELET
BASOS ABS: 0 10*3/uL (ref 0.0–0.1)
BASOS PCT: 0 %
Eosinophils Absolute: 0.3 10*3/uL (ref 0.0–0.7)
Eosinophils Relative: 2 %
HEMATOCRIT: 43.8 % (ref 39.0–52.0)
HEMOGLOBIN: 14.7 g/dL (ref 13.0–17.0)
LYMPHS PCT: 20 %
Lymphs Abs: 2.7 10*3/uL (ref 0.7–4.0)
MCH: 30.9 pg (ref 26.0–34.0)
MCHC: 33.6 g/dL (ref 30.0–36.0)
MCV: 92 fL (ref 78.0–100.0)
MONOS PCT: 6 %
Monocytes Absolute: 0.8 10*3/uL (ref 0.1–1.0)
NEUTROS ABS: 9.3 10*3/uL — AB (ref 1.7–7.7)
NEUTROS PCT: 72 %
Platelets: 160 10*3/uL (ref 150–400)
RBC: 4.76 MIL/uL (ref 4.22–5.81)
RDW: 12.5 % (ref 11.5–15.5)
WBC: 13.1 10*3/uL — ABNORMAL HIGH (ref 4.0–10.5)

## 2015-09-07 LAB — COMPREHENSIVE METABOLIC PANEL
ALBUMIN: 4.1 g/dL (ref 3.5–5.0)
ALK PHOS: 52 U/L (ref 38–126)
ALT: 36 U/L (ref 17–63)
AST: 34 U/L (ref 15–41)
Anion gap: 7 (ref 5–15)
BILIRUBIN TOTAL: 0.8 mg/dL (ref 0.3–1.2)
BUN: 11 mg/dL (ref 6–20)
CALCIUM: 9.5 mg/dL (ref 8.9–10.3)
CO2: 29 mmol/L (ref 22–32)
CREATININE: 1.01 mg/dL (ref 0.61–1.24)
Chloride: 101 mmol/L (ref 101–111)
GFR calc Af Amer: >60 mL/min (ref >60)
GFR calc non Af Amer: >60 mL/min (ref >60)
GLUCOSE: 91 mg/dL (ref 65–99)
Potassium: 4.3 mmol/L (ref 3.5–5.1)
Sodium: 137 mmol/L (ref 135–145)
TOTAL PROTEIN: 8 g/dL (ref 6.5–8.1)

## 2015-09-07 LAB — URINE MICROSCOPIC-ADD ON

## 2015-09-07 MED ORDER — NAPROXEN 500 MG PO TABS: 500.0000 mg | ORAL_TABLET | Freq: Two times a day (BID) | ORAL | 0 refills | Status: DC

## 2015-09-07 MED ORDER — SODIUM CHLORIDE 0.9 % IV SOLN
INTRAVENOUS | Status: DC
  Administered 2015-09-07: 1000 mL via INTRAVENOUS
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
## 2015-09-07 NOTE — ED Triage Notes (Signed)
Patient complains of lower back pain with burning sensation while urinating. States urinary frequency. Also complains of general body aches. Symptoms started 1 week ago. NAD

## 2015-09-07 NOTE — Discharge Instructions (Signed)
Return to the ED  for fever, worsening symptoms, take the naprosyn as needed for pain.  Follow up with a primary care doctor next week to be rechecked if the symptoms persist

## 2015-09-10 ENCOUNTER — Encounter (HOSPITAL_COMMUNITY): Payer: Self-pay | Admitting: *Deleted

## 2015-09-10 ENCOUNTER — Inpatient Hospital Stay (HOSPITAL_COMMUNITY)
Admission: EM | Admit: 2015-09-10 | Discharge: 2015-09-14 | DRG: 872 | Disposition: A | Discharge status: Home / Self Care | Payer: Commercial Managed Care - HMO | Attending: Family Medicine | Admitting: Family Medicine

## 2015-09-10 DIAGNOSIS — Z96649 Presence of unspecified artificial hip joint: Secondary | ICD-10-CM | POA: Diagnosis present

## 2015-09-10 DIAGNOSIS — Z6841 Body Mass Index (BMI) 40.0 and over, adult: Secondary | ICD-10-CM

## 2015-09-10 DIAGNOSIS — Z7982 Long term (current) use of aspirin: Secondary | ICD-10-CM

## 2015-09-10 DIAGNOSIS — L0390 Cellulitis, unspecified: Secondary | ICD-10-CM

## 2015-09-10 DIAGNOSIS — R0789 Other chest pain: Secondary | ICD-10-CM | POA: Diagnosis present

## 2015-09-10 DIAGNOSIS — Z87891 Personal history of nicotine dependence: Secondary | ICD-10-CM

## 2015-09-10 DIAGNOSIS — L03116 Cellulitis of left lower limb: Secondary | ICD-10-CM | POA: Diagnosis present

## 2015-09-10 DIAGNOSIS — R0602 Shortness of breath: Secondary | ICD-10-CM

## 2015-09-10 DIAGNOSIS — Z993 Dependence on wheelchair: Secondary | ICD-10-CM

## 2015-09-10 DIAGNOSIS — A419 Sepsis, unspecified organism: Principal | ICD-10-CM | POA: Diagnosis present

## 2015-09-10 DIAGNOSIS — I509 Heart failure, unspecified: Secondary | ICD-10-CM | POA: Diagnosis present

## 2015-09-10 DIAGNOSIS — R079 Chest pain, unspecified: Secondary | ICD-10-CM

## 2015-09-10 DIAGNOSIS — L03119 Cellulitis of unspecified part of limb: Secondary | ICD-10-CM | POA: Diagnosis present

## 2015-09-10 DIAGNOSIS — Z91040 Latex allergy status: Secondary | ICD-10-CM

## 2015-09-10 DIAGNOSIS — I9589 Other hypotension: Secondary | ICD-10-CM

## 2015-09-10 DIAGNOSIS — Z9889 Other specified postprocedural states: Secondary | ICD-10-CM

## 2015-09-10 DIAGNOSIS — F22 Delusional disorders: Secondary | ICD-10-CM

## 2015-09-10 DIAGNOSIS — I878 Other specified disorders of veins: Secondary | ICD-10-CM | POA: Diagnosis present

## 2015-09-10 DIAGNOSIS — I429 Cardiomyopathy, unspecified: Secondary | ICD-10-CM | POA: Diagnosis present

## 2015-09-10 DIAGNOSIS — E669 Obesity, unspecified: Secondary | ICD-10-CM | POA: Diagnosis present

## 2015-09-10 DIAGNOSIS — I428 Other cardiomyopathies: Secondary | ICD-10-CM

## 2015-09-10 DIAGNOSIS — R509 Fever, unspecified: Secondary | ICD-10-CM | POA: Diagnosis present

## 2015-09-10 DIAGNOSIS — I110 Hypertensive heart disease with heart failure: Secondary | ICD-10-CM | POA: Diagnosis present

## 2015-09-10 DIAGNOSIS — Z79899 Other long term (current) drug therapy: Secondary | ICD-10-CM

## 2015-09-10 DIAGNOSIS — Z981 Arthrodesis status: Secondary | ICD-10-CM

## 2015-09-10 DIAGNOSIS — I890 Lymphedema, not elsewhere classified: Secondary | ICD-10-CM | POA: Diagnosis present

## 2015-09-10 LAB — URINE CULTURE

## 2015-09-10 NOTE — ED Triage Notes (Signed)
Pt brought in by [facility] for c/o lower back pain, fever and generalized body aches
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
## 2015-09-11 ENCOUNTER — Emergency Department (HOSPITAL_COMMUNITY): Payer: Commercial Managed Care - HMO

## 2015-09-11 ENCOUNTER — Encounter (HOSPITAL_COMMUNITY): Payer: Self-pay

## 2015-09-11 ENCOUNTER — Inpatient Hospital Stay (HOSPITAL_COMMUNITY): Payer: Commercial Managed Care - HMO

## 2015-09-11 DIAGNOSIS — Z9889 Other specified postprocedural states: Secondary | ICD-10-CM | POA: Diagnosis not present

## 2015-09-11 DIAGNOSIS — Z7982 Long term (current) use of aspirin: Secondary | ICD-10-CM | POA: Diagnosis not present

## 2015-09-11 DIAGNOSIS — E669 Obesity, unspecified: Secondary | ICD-10-CM | POA: Diagnosis present

## 2015-09-11 DIAGNOSIS — I509 Heart failure, unspecified: Secondary | ICD-10-CM | POA: Diagnosis present

## 2015-09-11 DIAGNOSIS — Z96649 Presence of unspecified artificial hip joint: Secondary | ICD-10-CM | POA: Diagnosis present

## 2015-09-11 DIAGNOSIS — Z91040 Latex allergy status: Secondary | ICD-10-CM | POA: Diagnosis not present

## 2015-09-11 DIAGNOSIS — I429 Cardiomyopathy, unspecified: Secondary | ICD-10-CM | POA: Diagnosis present

## 2015-09-11 DIAGNOSIS — R079 Chest pain, unspecified: Secondary | ICD-10-CM

## 2015-09-11 DIAGNOSIS — I878 Other specified disorders of veins: Secondary | ICD-10-CM | POA: Diagnosis present

## 2015-09-11 DIAGNOSIS — Z981 Arthrodesis status: Secondary | ICD-10-CM | POA: Diagnosis not present

## 2015-09-11 DIAGNOSIS — Z993 Dependence on wheelchair: Secondary | ICD-10-CM | POA: Diagnosis not present

## 2015-09-11 DIAGNOSIS — R509 Fever, unspecified: Secondary | ICD-10-CM | POA: Diagnosis not present

## 2015-09-11 DIAGNOSIS — Z79899 Other long term (current) drug therapy: Secondary | ICD-10-CM | POA: Diagnosis not present

## 2015-09-11 DIAGNOSIS — Z6841 Body Mass Index (BMI) 40.0 and over, adult: Secondary | ICD-10-CM | POA: Diagnosis not present

## 2015-09-11 DIAGNOSIS — I890 Lymphedema, not elsewhere classified: Secondary | ICD-10-CM | POA: Diagnosis present

## 2015-09-11 DIAGNOSIS — Z87891 Personal history of nicotine dependence: Secondary | ICD-10-CM | POA: Diagnosis not present

## 2015-09-11 DIAGNOSIS — L0390 Cellulitis, unspecified: Secondary | ICD-10-CM | POA: Diagnosis not present

## 2015-09-11 DIAGNOSIS — A419 Sepsis, unspecified organism: Secondary | ICD-10-CM | POA: Diagnosis present

## 2015-09-11 DIAGNOSIS — I110 Hypertensive heart disease with heart failure: Secondary | ICD-10-CM | POA: Diagnosis present

## 2015-09-11 DIAGNOSIS — L03116 Cellulitis of left lower limb: Secondary | ICD-10-CM | POA: Diagnosis present

## 2015-09-11 DIAGNOSIS — I9589 Other hypotension: Secondary | ICD-10-CM | POA: Diagnosis present

## 2015-09-11 LAB — CBC WITH DIFFERENTIAL/PLATELET
BASOS ABS: 0 10*3/uL (ref 0.0–0.1)
Basophils Relative: 0 %
EOS ABS: 0 10*3/uL (ref 0.0–0.7)
EOS PCT: 0 %
HCT: 44 % (ref 39.0–52.0)
HEMOGLOBIN: 14.8 g/dL (ref 13.0–17.0)
LYMPHS ABS: 0.8 10*3/uL (ref 0.7–4.0)
LYMPHS PCT: 4 %
MCH: 30.9 pg (ref 26.0–34.0)
MCHC: 33.6 g/dL (ref 30.0–36.0)
MCV: 91.9 fL (ref 78.0–100.0)
Monocytes Absolute: 0.7 10*3/uL (ref 0.1–1.0)
Monocytes Relative: 3 %
NEUTROS PCT: 93 %
Neutro Abs: 20.1 10*3/uL — ABNORMAL HIGH (ref 1.7–7.7)
PLATELETS: 143 10*3/uL — AB (ref 150–400)
RBC: 4.79 MIL/uL (ref 4.22–5.81)
RDW: 12.6 % (ref 11.5–15.5)
WBC: 21.6 10*3/uL — AB (ref 4.0–10.5)

## 2015-09-11 LAB — COMPREHENSIVE METABOLIC PANEL
ALT: 58 U/L (ref 17–63)
AST: 63 U/L — AB (ref 15–41)
Albumin: 4.4 g/dL (ref 3.5–5.0)
Alkaline Phosphatase: 51 U/L (ref 38–126)
Anion gap: 9 (ref 5–15)
BILIRUBIN TOTAL: 0.8 mg/dL (ref 0.3–1.2)
BUN: 12 mg/dL (ref 6–20)
CHLORIDE: 100 mmol/L — AB (ref 101–111)
CO2: 28 mmol/L (ref 22–32)
Calcium: 9.4 mg/dL (ref 8.9–10.3)
Creatinine, Ser: 1.1 mg/dL (ref 0.61–1.24)
Glucose, Bld: 118 mg/dL — ABNORMAL HIGH (ref 65–99)
POTASSIUM: 4 mmol/L (ref 3.5–5.1)
Sodium: 137 mmol/L (ref 135–145)
TOTAL PROTEIN: 8.5 g/dL — AB (ref 6.5–8.1)

## 2015-09-11 LAB — BLOOD CULTURE ID PANEL (REFLEXED)
ACINETOBACTER BAUMANNII: NOT DETECTED
CANDIDA ALBICANS: NOT DETECTED
CANDIDA PARAPSILOSIS: NOT DETECTED
CARBAPENEM RESISTANCE: NOT DETECTED
Candida glabrata: NOT DETECTED
Candida krusei: NOT DETECTED
Candida tropicalis: NOT DETECTED
ENTEROBACTER CLOACAE COMPLEX: NOT DETECTED
ENTEROBACTERIACEAE SPECIES: NOT DETECTED
ENTEROCOCCUS SPECIES: NOT DETECTED
Escherichia coli: NOT DETECTED
HAEMOPHILUS INFLUENZAE: NOT DETECTED
KLEBSIELLA PNEUMONIAE: NOT DETECTED
Klebsiella oxytoca: NOT DETECTED
Listeria monocytogenes: NOT DETECTED
METHICILLIN RESISTANCE: NOT DETECTED
NEISSERIA MENINGITIDIS: NOT DETECTED
PSEUDOMONAS AERUGINOSA: NOT DETECTED
Proteus species: NOT DETECTED
STAPHYLOCOCCUS AUREUS BCID: NOT DETECTED
STAPHYLOCOCCUS SPECIES: NOT DETECTED
STREPTOCOCCUS AGALACTIAE: DETECTED — AB
STREPTOCOCCUS PNEUMONIAE: NOT DETECTED
STREPTOCOCCUS PYOGENES: NOT DETECTED
Serratia marcescens: NOT DETECTED
Streptococcus species: DETECTED — AB
VANCOMYCIN RESISTANCE: NOT DETECTED

## 2015-09-11 LAB — CBC
HCT: 37.7 % — ABNORMAL LOW (ref 39.0–52.0)
HEMOGLOBIN: 12.5 g/dL — AB (ref 13.0–17.0)
MCH: 30.9 pg (ref 26.0–34.0)
MCHC: 33.2 g/dL (ref 30.0–36.0)
MCV: 93.3 fL (ref 78.0–100.0)
Platelets: 137 10*3/uL — ABNORMAL LOW (ref 150–400)
RBC: 4.04 MIL/uL — ABNORMAL LOW (ref 4.22–5.81)
RDW: 12.5 % (ref 11.5–15.5)
WBC: 25.4 10*3/uL — AB (ref 4.0–10.5)

## 2015-09-11 LAB — URINE MICROSCOPIC-ADD ON

## 2015-09-11 LAB — TROPONIN I
TROPONIN I: 0.03 ng/mL — AB (ref <0.03)
TROPONIN I: 0.03 ng/mL — AB (ref <0.03)
Troponin I: 0.07 ng/mL (ref <0.03)
Troponin I: 0.07 ng/mL (ref <0.03)

## 2015-09-11 LAB — BASIC METABOLIC PANEL
ANION GAP: 10 (ref 5–15)
BUN: 13 mg/dL (ref 6–20)
CALCIUM: 8.1 mg/dL — AB (ref 8.9–10.3)
CO2: 22 mmol/L (ref 22–32)
Chloride: 106 mmol/L (ref 101–111)
Creatinine, Ser: 1.14 mg/dL (ref 0.61–1.24)
GLUCOSE: 104 mg/dL — AB (ref 65–99)
POTASSIUM: 3.4 mmol/L — AB (ref 3.5–5.1)
SODIUM: 138 mmol/L (ref 135–145)

## 2015-09-11 LAB — URINALYSIS, ROUTINE W REFLEX MICROSCOPIC
BILIRUBIN URINE: NEGATIVE
GLUCOSE, UA: NEGATIVE mg/dL
Leukocytes, UA: NEGATIVE
Nitrite: NEGATIVE
PH: 5 (ref 5.0–8.0)

## 2015-09-11 LAB — LACTIC ACID, PLASMA
LACTIC ACID, VENOUS: 1.2 mmol/L (ref 0.5–1.9)
LACTIC ACID, VENOUS: 1.3 mmol/L (ref 0.5–1.9)
Lactic Acid, Venous: 2.1 mmol/L (ref 0.5–1.9)

## 2015-09-11 LAB — BRAIN NATRIURETIC PEPTIDE: B NATRIURETIC PEPTIDE 5: 46 pg/mL (ref 0.0–100.0)

## 2015-09-11 LAB — PROCALCITONIN: PROCALCITONIN: 19.15 ng/mL

## 2015-09-11 LAB — MRSA PCR SCREENING: MRSA by PCR: NEGATIVE

## 2015-09-11 MED ORDER — VANCOMYCIN HCL 10 G IV SOLR
2500.0000 mg | Freq: Once | INTRAVENOUS | Status: DC
  Filled 2015-09-11: qty 2500

## 2015-09-11 MED ORDER — IPRATROPIUM BROMIDE 0.02 % IN SOLN: 0.5000 mg | Freq: Once | RESPIRATORY_TRACT | Status: DC

## 2015-09-11 MED ORDER — ACETAMINOPHEN 500 MG PO TABS
ORAL_TABLET | ORAL | Status: AC
  Administered 2015-09-11: 02:00:00
  Filled 2015-09-11: qty 1

## 2015-09-11 MED ORDER — ALBUTEROL SULFATE (2.5 MG/3ML) 0.083% IN NEBU: 5.0000 mg | INHALATION_SOLUTION | Freq: Once | RESPIRATORY_TRACT | Status: DC

## 2015-09-11 MED ORDER — SODIUM CHLORIDE 0.9 % IV BOLUS (SEPSIS)
1000.0000 mL | Freq: Once | INTRAVENOUS | Status: AC
Start: 2015-09-11 — End: 2015-09-11
  Administered 2015-09-11: 1000 mL via INTRAVENOUS

## 2015-09-11 MED ORDER — DEXTROSE 5 % IV SOLN
1.0000 g | Freq: Once | INTRAVENOUS | Status: AC
  Administered 2015-09-11: 1 g via INTRAVENOUS
  Filled 2015-09-11: qty 10

## 2015-09-11 MED ORDER — SODIUM CHLORIDE 0.9 % IV SOLN
INTRAVENOUS | Status: DC
  Administered 2015-09-11: 05:00:00 via INTRAVENOUS

## 2015-09-11 MED ORDER — PIPERACILLIN-TAZOBACTAM 3.375 G IVPB 30 MIN
3.3750 g | Freq: Once | INTRAVENOUS | Status: AC
  Administered 2015-09-11: 3.375 g via INTRAVENOUS
  Filled 2015-09-11: qty 50

## 2015-09-11 MED ORDER — OXYCODONE HCL 5 MG PO TABS
10.0000 mg | ORAL_TABLET | Freq: Four times a day (QID) | ORAL | Status: DC | PRN
  Administered 2015-09-11 – 2015-09-12 (×3): 10 mg via ORAL
  Filled 2015-09-11 (×3): qty 2

## 2015-09-11 MED ORDER — PIPERACILLIN-TAZOBACTAM 3.375 G IVPB: 3.3750 g | Freq: Three times a day (TID) | INTRAVENOUS | Status: DC

## 2015-09-11 MED ORDER — CEFAZOLIN SODIUM-DEXTROSE 2-4 GM/100ML-% IV SOLN
2.0000 g | Freq: Three times a day (TID) | INTRAVENOUS | Status: DC
  Administered 2015-09-11 (×2): 2 g via INTRAVENOUS
  Filled 2015-09-11 (×5): qty 100

## 2015-09-11 MED ORDER — SODIUM CHLORIDE 0.9 % IV SOLN
1500.0000 mg | Freq: Once | INTRAVENOUS | Status: DC
  Filled 2015-09-11: qty 1500

## 2015-09-11 MED ORDER — SODIUM CHLORIDE 0.9 % IV SOLN: INTRAVENOUS | Status: AC

## 2015-09-11 MED ORDER — SODIUM CHLORIDE 0.9 % IV BOLUS (SEPSIS)
500.0000 mL | Freq: Once | INTRAVENOUS | Status: AC
  Administered 2015-09-11: 500 mL via INTRAVENOUS

## 2015-09-11 MED ORDER — VANCOMYCIN HCL IN DEXTROSE 1-5 GM/200ML-% IV SOLN
1000.0000 mg | Freq: Once | INTRAVENOUS | Status: DC
  Administered 2015-09-11: 1000 mg via INTRAVENOUS
  Filled 2015-09-11: qty 200

## 2015-09-11 MED ORDER — TRAMADOL HCL 50 MG PO TABS: 100.0000 mg | ORAL_TABLET | Freq: Four times a day (QID) | ORAL | Status: DC | PRN

## 2015-09-11 MED ORDER — ALBUTEROL SULFATE (2.5 MG/3ML) 0.083% IN NEBU
2.5000 mg | INHALATION_SOLUTION | Freq: Once | RESPIRATORY_TRACT | Status: AC
  Administered 2015-09-11: 2.5 mg via RESPIRATORY_TRACT

## 2015-09-11 MED ORDER — SODIUM CHLORIDE 0.9 % IV BOLUS (SEPSIS)
1000.0000 mL | Freq: Once | INTRAVENOUS | Status: AC
  Administered 2015-09-11: 1000 mL via INTRAVENOUS

## 2015-09-11 MED ORDER — ORAL CARE MOUTH RINSE
15.0000 mL | Freq: Two times a day (BID) | OROMUCOSAL | Status: DC
  Administered 2015-09-11: 15 mL via OROMUCOSAL

## 2015-09-11 MED ORDER — ACETAMINOPHEN 500 MG PO TABS
1000.0000 mg | ORAL_TABLET | Freq: Once | ORAL | Status: AC
  Administered 2015-09-11: 1000 mg via ORAL
  Filled 2015-09-11: qty 2

## 2015-09-11 MED ORDER — ASPIRIN EC 81 MG PO TBEC
81.0000 mg | DELAYED_RELEASE_TABLET | Freq: Every day | ORAL | Status: DC
  Administered 2015-09-11 – 2015-09-14 (×4): 81 mg via ORAL
  Filled 2015-09-11 (×4): qty 1

## 2015-09-11 MED ORDER — SODIUM CHLORIDE 0.9 % IV BOLUS (SEPSIS): 1000.0000 mL | Freq: Once | INTRAVENOUS | Status: DC

## 2015-09-11 MED ORDER — VANCOMYCIN HCL 10 G IV SOLR
1500.0000 mg | Freq: Three times a day (TID) | INTRAVENOUS | Status: DC
  Administered 2015-09-12: 1500 mg via INTRAVENOUS
  Filled 2015-09-11 (×2): qty 1500

## 2015-09-11 MED ORDER — IPRATROPIUM-ALBUTEROL 0.5-2.5 (3) MG/3ML IN SOLN
3.0000 mL | Freq: Once | RESPIRATORY_TRACT | Status: AC
  Administered 2015-09-11: 3 mL via RESPIRATORY_TRACT

## 2015-09-11 NOTE — ED Notes (Signed)
Pt reports feeling much better- family at bedside-
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
## 2015-09-11 NOTE — ED Notes (Signed)
CRITICAL VALUE ALERT  Critical value received:  Troponin 0.07  Date of notification:  09/11/2015  Time of notification:  0059  Critical value read back:Yes.    Nurse who received alert:  Neldon Mc RN  MD notified (1st page):  Dr. Lynelle Doctor  Time of first page:  0104  MD notified (2nd page):  Time of second page:  Responding MD:    Time MD responded:

## 2015-09-11 NOTE — Progress Notes (Signed)
CRITICAL VALUE ALERT  Critical value received:  Gram positive cocci in chains growing in all bottles of blood culture  Date of notification:  09/11/2015  Time of notification:  1801  Critical value read back:Yes.    Nurse who received alert:  Fara Chute, RN  MD notified (1st page):  Dr. Rhona Leavens  Time of first page:  1804  MD notified (2nd page):  Time of second page:  Responding MD:  Dr. Rhona Leavens  Time MD responded:  1805  Dr. Rhona Leavens called RN and stated that the Ancef will be changed to Vancomycin.

## 2015-09-11 NOTE — ED Notes (Signed)
Report to Cindy, RN.

## 2015-09-11 NOTE — Progress Notes (Signed)
CRITICAL VALUE ALERT  Critical value received:  Blood cultures + for Streptococcus agalactiae  Date of notification:  09/11/15  Time of notification:  2242  Critical value read back: Yes  Nurse who received alert:  Bess Harvest, RN  MD notified (1st page):  Donnamarie Poag   Time of first page:  2247  Time MD responded:  No response.

## 2015-09-11 NOTE — ED Notes (Signed)
Pt is morbidly obese with 2 IVs- Difficulty with blood pressure cuff due to pt obesity- IV to R is patent when pressed but sluggish when secured. Pump used. Pt reports that he feels better- Almira Coaster, RN, NS to pharmacy to retrieve actamenophen IV for this pt
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
## 2015-09-11 NOTE — Progress Notes (Signed)
Pharmacy Antibiotic Note  Jordan Jensen is a 45 y.o. male admitted on 09/10/2015 with bacteremia.  Pharmacy has been consulted for vancomycin dosing.  Plan: Vancomycin 2500 mg IV x 1 then 1500 mg IV q8 hours F/u renal function, cultures and clinical course  Height: 5\' 11"  (180.3 cm) Weight: (!) 379 lb 6.6 oz (172.1 kg) IBW/kg (Calculated) : 75.3  Temp (24hrs), Avg:100 F (37.8 C), Min:97.6 F (36.4 C), Max:103.1 F (39.5 C)   Recent Labs Lab 09/07/15 1953 09/10/15 2357 09/11/15 0036 09/11/15 0454 09/11/15 0747  WBC 13.1* 21.6*  --  25.4*  --   CREATININE 1.01 1.10  --  1.14  --   LATICACIDVEN  --   --  2.1* 1.2 1.3    Estimated Creatinine Clearance: 130.6 mL/min (by C-G formula based on SCr of 1.14 mg/dL).    Allergies  Allergen Reactions  . Latex Itching and Rash    cellulitis    Antimicrobials this admission: vanc 9/5 >>  rocephin 9/5 >> 9/5 Ancef  9/5>>9/5  Microbiology results: 9/5  BCx: gpc in 2 /2 9/5 UCx:   9/5 MRSA PCR: (-)  Thank you for allowing pharmacy to be a part of this patient's care.  Talbert Cage Poteet 09/11/2015 6:30 PM

## 2015-09-11 NOTE — Progress Notes (Signed)
PROGRESS NOTE    Jordan Jensen  ZOX:[id number] DOB: [birth year] DOA: 09/10/2015 PCP: [name], DO    Brief Narrative:  45 y.o. male with medical history significant of bilateral chronic leg lymphedema, CHF, MO, wheelchair dependent, recurrent cellulitis presented with sudden onset of chills.  Pt came to ED and fever was over 103.  Pt came to ED 4 days prior to hospital admission with concerns he had a uti, he was evaluated and noted not to have a uti, urine cx from that day grows out less than 10k colonies and was not further evaluated. Patient was found to have findings worrisome for LLE cellulitis with sepsis  Assessment & Plan:   Principal Problem:   Sepsis due to cellulitis [facility]) Active Problems:   Obesity   Recurrent cellulitis of lower leg   Atypical chest pain   Nonischemic cardiomyopathy (HCC)   Pyrexia   1. LLE cellulitis with sepsis present on admission 1. LLE erythema, warmth, swelling noted on exam 2. Presented with leukocytosis, fevers, elevated lactic acid 3. Pt had been continued on empiric vanc and zosyn on admit 4. Cellulitis appears to be moderate nonpurulent in nature, thus will transition to IV ancef 5. If cellulitis further improves, consider transition to keflex 6. Sepsis is improving 2. Chest pain 1. Stable at present 2. minimally elevated trop likely secondary to sepsis 3. Trop trending down 4. Cont to monitor 3. Fevers 1. Likely secondary to presenting sepsis with cellulitis 2. Improving 4. Leukocytosis 1. WBC worsened overnight 2. Have changed abx to ancef 3. Blood cx neg x 2 thus far 4. Await urine cx results 5. Venous stasis 1. Pt uses compression stalkings at baseline, currently on hold given cellulitis 2. Monitor for now 6. Obesity 1. Stable at present 2. Continue to monitor  DVT prophylaxis: SCD's Code Status: Full Family Communication: Pt in room, family not at bedside Disposition Plan: Possible d/c in 48-72hrs  Consultants:      Procedures:     Antimicrobials: Anti-infectives    Start     Dose/Rate Route Frequency Ordered Stop   09/11/15 0930  ceFAZolin (ANCEF) IVPB 2g/100 mL premix     2 g 200 mL/hr over 30 Minutes Intravenous Every 8 hours 09/11/15 0906     09/11/15 0900  piperacillin-tazobactam (ZOSYN) IVPB 3.375 g  Status:  Discontinued     3.375 g 12.5 mL/hr over 240 Minutes Intravenous Every 8 hours 09/11/15 0822 09/11/15 0906   09/11/15 0500  vancomycin (VANCOCIN) 1,500 mg in sodium chloride 0.9 % 500 mL IVPB  Status:  Discontinued     1,500 mg 250 mL/hr over 120 Minutes Intravenous  Once 09/11/15 0449 09/11/15 0906   09/11/15 0315  piperacillin-tazobactam (ZOSYN) IVPB 3.375 g     3.375 g 100 mL/hr over 30 Minutes Intravenous  Once 09/11/15 0306 09/11/15 0423   09/11/15 0315  vancomycin (VANCOCIN) IVPB 1000 mg/200 mL premix  Status:  Discontinued     1,000 mg 200 mL/hr over 60 Minutes Intravenous  Once 09/11/15 0306 09/11/15 0453   09/11/15 0130  cefTRIAXone (ROCEPHIN) 1 g in dextrose 5 % 50 mL IVPB     1 g 100 mL/hr over 30 Minutes Intravenous  Once 09/11/15 0119 09/11/15 0310   09/11/15 0100  cefTRIAXone (ROCEPHIN) 1 g in dextrose 5 % 50 mL IVPB     1 g 100 mL/hr over 30 Minutes Intravenous  Once 09/11/15 0052 09/11/15 0214      Subjective: Reports feeling better today.  Questioning about going home  Objective: Vitals:   09/11/15 0630 09/11/15 0645 09/11/15 0815 09/11/15 1209  BP: (!) 89/73 99/81    Pulse: (!) 103 (!) 107    Resp:      Temp:   97.6 F (36.4 C) 98.4 F (36.9 C)  TempSrc:   Oral Oral  SpO2: 100% 100%    Weight:      Height:        Intake/Output Summary (Last 24 hours) at 09/11/15 1547 Last data filed at 09/11/15 1329  Gross per 24 hour  Intake             1535 ml  Output              475 ml  Net             1060 ml   Filed Weights   09/10/15 2353 09/11/15 0445  Weight: (!) 168.7 kg (372 lb) (!) 172.1 kg (379 lb 6.6 oz)    Examination:  General  exam: Appears calm and comfortable, laying in bed Respiratory system: Clear to auscultation. Respiratory effort normal. Cardiovascular system: S1 & S2 heard, RRR.  Gastrointestinal system: Abdomen is obese, nondistended, soft and nontender. No organomegaly or masses felt. Normal bowel sounds heard. Central nervous system: Alert and oriented. No focal neurological deficits. Extremities: Symmetric 5 x 5 power. Skin: B LE venous stasis changes, LLE with increased edema, warmth, redness extending to just under the knee without drainage Psychiatry: Judgement and insight appear normal. Mood & affect appropriate.   Data Reviewed: I have personally reviewed following labs and imaging studies  CBC:  Recent Labs Lab 09/07/15 1953 09/10/15 2357 09/11/15 0454  WBC 13.1* 21.6* 25.4*  NEUTROABS 9.3* 20.1*  --   HGB 14.7 14.8 12.5*  HCT 43.8 44.0 37.7*  MCV 92.0 91.9 93.3  PLT 160 143* 137*   Basic Metabolic Panel:  Recent Labs Lab 09/07/15 1953 09/10/15 2357 09/11/15 0454  NA 137 137 138  K 4.3 4.0 3.4*  CL 101 100* 106  CO2 29 28 22   GLUCOSE 91 118* 104*  BUN 11 12 13   CREATININE 1.01 1.10 1.14  CALCIUM 9.5 9.4 8.1*   GFR: Estimated Creatinine Clearance: 130.6 mL/min (by C-G formula based on SCr of 1.14 mg/dL). Liver Function Tests:  Recent Labs Lab 09/07/15 1953 09/10/15 2357  AST 34 63*  ALT 36 58  ALKPHOS 52 51  BILITOT 0.8 0.8  PROT 8.0 8.5*  ALBUMIN 4.1 4.4   No results for input(s): LIPASE, AMYLASE in the last 168 hours. No results for input(s): AMMONIA in the last 168 hours. Coagulation Profile: No results for input(s): INR, PROTIME in the last 168 hours. Cardiac Enzymes:  Recent Labs Lab 09/10/15 2357 09/11/15 0454 09/11/15 1051  TROPONINI 0.07* 0.07* 0.03*   BNP (last 3 results) No results for input(s): PROBNP in the last 8760 hours. HbA1C: No results for input(s): HGBA1C in the last 72 hours. CBG: No results for input(s): GLUCAP in the last 168  hours. Lipid Profile: No results for input(s): CHOL, HDL, LDLCALC, TRIG, CHOLHDL, LDLDIRECT in the last 72 hours. Thyroid Function Tests: No results for input(s): TSH, T4TOTAL, FREET4, T3FREE, THYROIDAB in the last 72 hours. Anemia Panel: No results for input(s): VITAMINB12, FOLATE, FERRITIN, TIBC, IRON, RETICCTPCT in the last 72 hours. Sepsis Labs:  Recent Labs Lab 09/11/15 0036 09/11/15 0454 09/11/15 0747  PROCALCITON  --  19.15  --   LATICACIDVEN 2.1* 1.2 1.3  Recent Results (from the past 240 hour(s))  Urine culture     Status: Abnormal   Collection Time: 09/07/15  8:20 PM  Result Value Ref Range Status   Specimen Description URINE, CLEAN CATCH  Final   Special Requests NONE  Final   Culture (A)  Final    <10,000 COLONIES/mL INSIGNIFICANT GROWTH Performed at [facility]    Report Status 09/10/2015 FINAL  Final  Culture, blood (routine x 2)     Status: None (Preliminary result)   Collection Time: 09/11/15 12:36 AM  Result Value Ref Range Status   Specimen Description BLOOD LEFT ANTECUBITAL  Final   Special Requests BOTTLES DRAWN AEROBIC ONLY 4CC  Final   Culture NO GROWTH < 12 HOURS  Final   Report Status PENDING  Incomplete  Culture, blood (routine x 2)     Status: None (Preliminary result)   Collection Time: 09/11/15 12:53 AM  Result Value Ref Range Status   Specimen Description BLOOD RIGHT HAND  Final   Special Requests BOTTLES DRAWN AEROBIC AND ANAEROBIC 4CC EACH  Final   Culture NO GROWTH < 12 HOURS  Final   Report Status PENDING  Incomplete  MRSA PCR Screening     Status: None   Collection Time: 09/11/15  4:51 AM  Result Value Ref Range Status   MRSA by PCR NEGATIVE NEGATIVE Final    Comment:        The GeneXpert MRSA Assay (FDA approved for NASAL specimens only), is one component of a comprehensive MRSA colonization surveillance program. It is not intended to diagnose MRSA infection nor to guide or monitor treatment for MRSA infections.       Radiology Studies: Dg Chest Port 1 View  Result Date: 09/11/2015 CLINICAL DATA:  Acute onset of shortness of breath and left-sided chest pain. Initial encounter. EXAM: PORTABLE CHEST 1 VIEW COMPARISON:  Chest radiograph performed 09/07/2015 FINDINGS: The lungs are well-aerated. Vascular congestion is noted. Mildly increased interstitial markings may reflect mild interstitial edema. There is no evidence of pleural effusion or pneumothorax. The cardiomediastinal silhouette is borderline normal in size. No acute osseous abnormalities are seen. Cervical spinal fusion hardware is partially imaged. IMPRESSION: Vascular congestion noted. Mildly increased interstitial markings may reflect mild interstitial edema, depending on the patient's symptoms. Electronically Signed   By: Roanna Raider M.D.   On: 09/11/2015 00:34    Scheduled Meds: . aspirin EC  81 mg Oral Daily  .  ceFAZolin (ANCEF) IV  2 g Intravenous Q8H   Continuous Infusions: . sodium chloride 100 mL/hr at 09/11/15 1103     LOS: 0 days   Sybella Harnish, Scheryl Marten, MD [facility] Pager [phone]  If 7PM-7AM, please contact night-coverage [email or web address] Password [email or web address] 09/11/2015, 3:47 PM

## 2015-09-12 DIAGNOSIS — R509 Fever, unspecified: Secondary | ICD-10-CM

## 2015-09-12 DIAGNOSIS — A419 Sepsis, unspecified organism: Principal | ICD-10-CM

## 2015-09-12 DIAGNOSIS — L0390 Cellulitis, unspecified: Secondary | ICD-10-CM

## 2015-09-12 LAB — CBC
HCT: 37.5 % — ABNORMAL LOW (ref 39.0–52.0)
Hemoglobin: 12.1 g/dL — ABNORMAL LOW (ref 13.0–17.0)
MCH: 30 pg (ref 26.0–34.0)
MCHC: 32.3 g/dL (ref 30.0–36.0)
MCV: 92.8 fL (ref 78.0–100.0)
PLATELETS: 122 10*3/uL — AB (ref 150–400)
RBC: 4.04 MIL/uL — ABNORMAL LOW (ref 4.22–5.81)
RDW: 13.2 % (ref 11.5–15.5)
WBC: 21.2 10*3/uL — AB (ref 4.0–10.5)

## 2015-09-12 LAB — BASIC METABOLIC PANEL
ANION GAP: 7 (ref 5–15)
BUN: 10 mg/dL (ref 6–20)
CALCIUM: 8.4 mg/dL — AB (ref 8.9–10.3)
CO2: 24 mmol/L (ref 22–32)
CREATININE: 0.93 mg/dL (ref 0.61–1.24)
Chloride: 105 mmol/L (ref 101–111)
GLUCOSE: 104 mg/dL — AB (ref 65–99)
Potassium: 3.8 mmol/L (ref 3.5–5.1)
Sodium: 136 mmol/L (ref 135–145)

## 2015-09-12 LAB — URINE CULTURE: Culture: <10000 — AB

## 2015-09-12 MED ORDER — CEFAZOLIN SODIUM-DEXTROSE 2-4 GM/100ML-% IV SOLN
2.0000 g | Freq: Three times a day (TID) | INTRAVENOUS | Status: DC
  Administered 2015-09-12 – 2015-09-14 (×6): 2 g via INTRAVENOUS
  Filled 2015-09-12 (×16): qty 100

## 2015-09-12 NOTE — Progress Notes (Signed)
Pharmacy Antibiotic Note  Jordan Jensen is a 45 y.o. male admitted on 09/10/2015 with bacteremia.  Pharmacy has been consulted for vancomycin dosing. BCID 9/6 shows Strep agalactiae for GPC in blood. Discussed with Dr. Selena Batten, will transition tx to Cefazolin 2g IV Q8h Plan: Cefazolin 2gm IV Q8H F/u renal function, cultures and clinical course  Height: 5\' 11"  (180.3 cm) Weight: (!) 379 lb 6.6 oz (172.1 kg) IBW/kg (Calculated) : 75.3  Temp (24hrs), Avg:99.3 F (37.4 C), Min:98.4 F (36.9 C), Max:100.5 F (38.1 C)   Recent Labs Lab 09/07/15 1953 09/10/15 2357 09/11/15 0036 09/11/15 0454 09/11/15 0747 09/12/15 0603  WBC 13.1* 21.6*  --  25.4*  --  21.2*  CREATININE 1.01 1.10  --  1.14  --  0.93  LATICACIDVEN  --   --  2.1* 1.2 1.3  --     Estimated Creatinine Clearance: 160 mL/min (by C-G formula based on SCr of 0.93 mg/dL).    Allergies  Allergen Reactions  . Latex Itching and Rash    cellulitis    Antimicrobials this admission: vanc 9/5 >> 9/6 rocephin 9/5 >> 9/5 Ancef  9/5>>9/5 Cefazolin 9/6>>  Microbiology results: 9/5  BCx: gpc in 2 /2 9/5 UCx:   9/5 MRSA PCR: (-) BCID 9/6 shows Strep agalactiae for GPC in blood. Discussed with Dr. Selena Batten, will transition tx to Cefazolin 2g IV Q8h  Enterococcus species NOT DETECTED NOT DETECTED  Vancomycin resistance NOT DETECTED NOT DETECTED  Listeria monocytogenes NOT DETECTED NOT DETECTED  Staphylococcus species NOT DETECTED NOT DETECTED  Staphylococcus aureus NOT DETECTED NOT DETECTED  Methicillin resistance NOT DETECTED NOT DETECTED  Streptococcus species NOT DETECTED DETECTED   Comments: CRITICAL RESULT CALLED TO, READ BACK BY AND VERIFIED WITH:  C ROSE RN [id number] 09/11/15 A BROWNING   Streptococcus agalactiae NOT DETECTED DETECTED   Comments: CRITICAL RESULT CALLED TO, READ BACK BY AND VERIFIED WITH:  C ROSE RN [id number] 09/11/15 A BROWNING   Streptococcus pneumoniae NOT DETECTED NOT DETECTED  Streptococcus pyogenes NOT  DETECTED NOT DETECTED  Acinetobacter baumannii NOT DETECTED NOT DETECTED  Enterobacteriaceae species NOT DETECTED NOT DETECTED  Enterobacter cloacae complex NOT DETECTED NOT DETECTED  Escherichia coli NOT DETECTED NOT DETECTED  Klebsiella oxytoca NOT DETECTED NOT DETECTED  Klebsiella pneumoniae NOT DETECTED NOT DETECTED  Proteus species NOT DETECTED NOT DETECTED  Serratia marcescens NOT DETECTED NOT DETECTED  Carbapenem resistance NOT DETECTED NOT DETECTED  Haemophilus influenzae NOT DETECTED NOT DETECTED  Neisseria meningitidis NOT DETECTED NOT DETECTED  Pseudomonas aeruginosa NOT DETECTED NOT DETECTED  Candida albicans NOT DETECTED NOT DETECTED  Candida glabrata NOT DETECTED NOT DETECTED  Candida krusei NOT DETECTED NOT DETECTED  Candida parapsilosis NOT DETECTED NOT DETECTED  Candida tropicalis NOT DETECTED NOT DETECTED  Comments: Performed at [facility]  Resulting Agency  [facility]    Specimen Collected: 09/11/15 00:53 Last Resulted: 09/11/15 22:42      Thank you for allowing pharmacy to be a part of this patient's care.  Elder Cyphers, BS Pharm D, BCPS Clinical Pharmacist Pager [phone]  09/12/2015 8:29 AM
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
## 2015-09-12 NOTE — Progress Notes (Signed)
Patient ID: Jordan Jensen, male   DOB: 1970/03/22, 45 y.o.   MRN: [id number]                                                                PROGRESS NOTE                                                                                                                                                                                                             Patient Demographics:    Jordan Jensen, is a 45 y.o. male, DOB - [birth year], JXB:[id number]  Admit date - 09/10/2015   Admitting Physician Haydee Monica, MD  Outpatient Primary MD for the patient is [name], DO  LOS - 1  Outpatient Specialists:    Chief Complaint  Patient presents with  . Generalized Body Aches       Brief Narrative  45 y.o.malewith medical history significant of bilateral chronic leg lymphedema, CHF, MO, wheelchair dependent, recurrent cellulitis presented with sudden onset of chills. Pt came to ED and fever was over 103. Pt came to ED 4 days prior to hospital admission with concerns he had a uti, he was evaluated and noted not to have a uti, urine cx from that day grows out less than 10k colonies and was not further evaluated. Patient was found to have findings worrisome for LLE cellulitis with sepsis   Subjective:    Jordan Jensen today has,been afebrile overnite.  Leg redness improving.  No headache, No chest pain, No abdominal pain - No Nausea, No new weakness tingling or numbness, No Cough - SOB.    Assessment  & Plan :    Principal Problem:   Sepsis due to cellulitis [facility]) Active Problems:   Obesity   Recurrent cellulitis of lower leg   Atypical chest pain   Nonischemic cardiomyopathy (HCC)   Pyrexia    1. LLE cellulitis with sepsis present on admission 1. LLE erythema, warmth, swelling noted on exam 2. Presented with leukocytosis, fevers, elevated lactic acid 3. Pt had been continued on empiric vanc and zosyn on admit, culture now gram positive cocci (streptococci) 4. Cellulitis appears to be  moderate nonpurulent in nature, thus will transition to IV ancef 5. If cellulitis further improves, consider transition to keflex 6. Sepsis is improving 2. Chest  pain 1. Stable at present 2. minimally elevated trop likely secondary to sepsis 3. Trop trending down 4. Cont to monitor 3. Fevers 1. Likely secondary to presenting sepsis with cellulitis 2. Improving 4. Leukocytosis 1. WBC improving 2. Have changed abx to ancef 3. Blood cx (strep) 4. Urine culture neg 5. Venous stasis 1. Pt uses compression stalkings at baseline, currently on hold given cellulitis 2. Monitor for now 6. Obesity 1. Stable at present 2. Continue to monitor  DVT prophylaxis: SCD's Code Status: Full Family Communication: w/ patient Disposition Plan: Possible d/c in 48-hrs  Lab Results  Component Value Date   PLT 122 (L) 09/12/2015    Antibiotics  :    Anti-infectives    Start     Dose/Rate Route Frequency Ordered Stop   09/12/15 0900  ceFAZolin (ANCEF) IVPB 2g/100 mL premix     2 g 200 mL/hr over 30 Minutes Intravenous Every 8 hours 09/12/15 0756     09/12/15 0300  vancomycin (VANCOCIN) 1,500 mg in sodium chloride 0.9 % 500 mL IVPB  Status:  Discontinued     1,500 mg 250 mL/hr over 120 Minutes Intravenous Every 8 hours 09/11/15 1841 09/12/15 0756   09/11/15 1815  vancomycin (VANCOCIN) 2,500 mg in sodium chloride 0.9 % 500 mL IVPB  Status:  Discontinued     2,500 mg 250 mL/hr over 120 Minutes Intravenous  Once 09/11/15 1812 09/12/15 0756   09/11/15 0930  ceFAZolin (ANCEF) IVPB 2g/100 mL premix  Status:  Discontinued     2 g 200 mL/hr over 30 Minutes Intravenous Every 8 hours 09/11/15 0906 09/11/15 1806   09/11/15 0900  piperacillin-tazobactam (ZOSYN) IVPB 3.375 g  Status:  Discontinued     3.375 g 12.5 mL/hr over 240 Minutes Intravenous Every 8 hours 09/11/15 0822 09/11/15 0906   09/11/15 0500  vancomycin (VANCOCIN) 1,500 mg in sodium chloride 0.9 % 500 mL IVPB  Status:  Discontinued      1,500 mg 250 mL/hr over 120 Minutes Intravenous  Once 09/11/15 0449 09/11/15 0906   09/11/15 0315  piperacillin-tazobactam (ZOSYN) IVPB 3.375 g     3.375 g 100 mL/hr over 30 Minutes Intravenous  Once 09/11/15 0306 09/11/15 0423   09/11/15 0315  vancomycin (VANCOCIN) IVPB 1000 mg/200 mL premix  Status:  Discontinued     1,000 mg 200 mL/hr over 60 Minutes Intravenous  Once 09/11/15 0306 09/11/15 0453   09/11/15 0130  cefTRIAXone (ROCEPHIN) 1 g in dextrose 5 % 50 mL IVPB     1 g 100 mL/hr over 30 Minutes Intravenous  Once 09/11/15 0119 09/11/15 0310   09/11/15 0100  cefTRIAXone (ROCEPHIN) 1 g in dextrose 5 % 50 mL IVPB     1 g 100 mL/hr over 30 Minutes Intravenous  Once 09/11/15 0052 09/11/15 0214        Objective:   Vitals:   09/11/15 0815 09/11/15 1209 09/11/15 2204 09/12/15 0449  BP:   (!) 87/31 (!) 105/47  Pulse:   97 91  Resp:   20   Temp: 97.6 F (36.4 C) 98.4 F (36.9 C) (!) 100.5 F (38.1 C) 98.9 F (37.2 C)  TempSrc: Oral Oral Oral Oral  SpO2:   95% 95%  Weight:      Height:        Wt Readings from Last 3 Encounters:  09/11/15 (!) 172.1 kg (379 lb 6.6 oz)  09/07/15 (!) 168.7 kg (372 lb)  12/08/14 (!) 168.7 kg (372 lb)  Intake/Output Summary (Last 24 hours) at 09/12/15 0905 Last data filed at 09/12/15 0451  Gross per 24 hour  Intake             1825 ml  Output             1475 ml  Net              350 ml     Physical Exam  Awake Alert, Oriented X 3, No new F.N deficits, Normal affect Eleele.AT,PERRAL Supple Neck,No JVD, No cervical lymphadenopathy appriciated.  Symmetrical Chest wall movement, Good air movement bilaterally, CTAB RRR,No Gallops,Rubs or new Murmurs, No Parasternal Heave +ve B.Sounds, Abd Soft, No tenderness, No organomegaly appriciated, No rebound - guarding or rigidity. No Cyanosis, Clubbing or edema, slight redness about 1/2 up to knee, no warmth.    Data Review:    CBC  Recent Labs Lab 09/07/15 1953 09/10/15 2357  09/11/15 0454 09/12/15 0603  WBC 13.1* 21.6* 25.4* 21.2*  HGB 14.7 14.8 12.5* 12.1*  HCT 43.8 44.0 37.7* 37.5*  PLT 160 143* 137* 122*  MCV 92.0 91.9 93.3 92.8  MCH 30.9 30.9 30.9 30.0  MCHC 33.6 33.6 33.2 32.3  RDW 12.5 12.6 12.5 13.2  LYMPHSABS 2.7 0.8  --   --   MONOABS 0.8 0.7  --   --   EOSABS 0.3 0.0  --   --   BASOSABS 0.0 0.0  --   --     Chemistries   Recent Labs Lab 09/07/15 1953 09/10/15 2357 09/11/15 0454 09/12/15 0603  NA 137 137 138 136  K 4.3 4.0 3.4* 3.8  CL 101 100* 106 105  CO2 29 28 22 24   GLUCOSE 91 118* 104* 104*  BUN 11 12 13 10   CREATININE 1.01 1.10 1.14 0.93  CALCIUM 9.5 9.4 8.1* 8.4*  AST 34 63*  --   --   ALT 36 58  --   --   ALKPHOS 52 51  --   --   BILITOT 0.8 0.8  --   --    ------------------------------------------------------------------------------------------------------------------ No results for input(s): CHOL, HDL, LDLCALC, TRIG, CHOLHDL, LDLDIRECT in the last 72 hours.  Lab Results  Component Value Date   HGBA1C 5.3 08/07/2014   ------------------------------------------------------------------------------------------------------------------ No results for input(s): TSH, T4TOTAL, T3FREE, THYROIDAB in the last 72 hours.  Invalid input(s): FREET3 ------------------------------------------------------------------------------------------------------------------ No results for input(s): VITAMINB12, FOLATE, FERRITIN, TIBC, IRON, RETICCTPCT in the last 72 hours.  Coagulation profile No results for input(s): INR, PROTIME in the last 168 hours.  No results for input(s): DDIMER in the last 72 hours.  Cardiac Enzymes  Recent Labs Lab 09/11/15 0454 09/11/15 1051 09/11/15 1651  TROPONINI 0.07* 0.03* 0.03*   ------------------------------------------------------------------------------------------------------------------    Component Value Date/Time   BNP 46.0 09/10/2015 0011    Inpatient Medications  Scheduled Meds: .  aspirin EC  81 mg Oral Daily  .  ceFAZolin (ANCEF) IV  2 g Intravenous Q8H   Continuous Infusions:  PRN Meds:.oxyCODONE, traMADol  Micro Results Recent Results (from the past 240 hour(s))  Urine culture     Status: Abnormal   Collection Time: 09/07/15  8:20 PM  Result Value Ref Range Status   Specimen Description URINE, CLEAN CATCH  Final   Special Requests NONE  Final   Culture (A)  Final    <10,000 COLONIES/mL INSIGNIFICANT GROWTH Performed at [facility]    Report Status 09/10/2015 FINAL  Final  Culture, blood (routine x 2)  Status: None (Preliminary result)   Collection Time: 09/11/15 12:36 AM  Result Value Ref Range Status   Specimen Description BLOOD LEFT ANTECUBITAL  Final   Special Requests BOTTLES DRAWN AEROBIC ONLY 4CC  Final   Culture  Setup Time   Final    GRAM POSITIVE COCCI IN CHAINS RECOVERED FROM THE AEROBIC BOTTLE Gram Stain Report Called to,Read Back By and Verified With: HAWKINS,M. AT 1803 ON 09/11/2015 BY BAUGHAM,M. Performed at [facility]    Culture PENDING  Incomplete   Report Status PENDING  Incomplete  Culture, blood (routine x 2)     Status: None (Preliminary result)   Collection Time: 09/11/15 12:53 AM  Result Value Ref Range Status   Specimen Description BLOOD RIGHT HAND  Final   Special Requests BOTTLES DRAWN AEROBIC AND ANAEROBIC 4CC EACH  Final   Culture  Setup Time   Final    GRAM POSITIVE COCCI IN CHAINS RECOVERED FROM BOTH THE AEROBIC AND THE ANAEROBIC BOTTLES Gram Stain Report Called to,Read Back By and Verified With: HAWKINS,M. AT 1803 ON 09/11/2015 BY BAUGHAM,M. Performed at [facility] Organism ID to follow CRITICAL RESULT CALLED TO, READ BACK BY AND VERIFIED WITH: C ROSE RN [id number] 09/11/15 A BROWNING Performed at [facility]    Culture PENDING  Incomplete   Report Status PENDING  Incomplete  Blood Culture ID Panel (Reflexed)     Status: Abnormal   Collection Time: 09/11/15 12:53 AM  Result Value Ref  Range Status   Enterococcus species NOT DETECTED NOT DETECTED Final   Vancomycin resistance NOT DETECTED NOT DETECTED Final   Listeria monocytogenes NOT DETECTED NOT DETECTED Final   Staphylococcus species NOT DETECTED NOT DETECTED Final   Staphylococcus aureus NOT DETECTED NOT DETECTED Final   Methicillin resistance NOT DETECTED NOT DETECTED Final   Streptococcus species DETECTED (A) NOT DETECTED Final    Comment: CRITICAL RESULT CALLED TO, READ BACK BY AND VERIFIED WITH: C ROSE RN [id number] 09/11/15 A BROWNING    Streptococcus agalactiae DETECTED (A) NOT DETECTED Final    Comment: CRITICAL RESULT CALLED TO, READ BACK BY AND VERIFIED WITH: C ROSE RN [id number] 09/11/15 A BROWNING    Streptococcus pneumoniae NOT DETECTED NOT DETECTED Final   Streptococcus pyogenes NOT DETECTED NOT DETECTED Final   Acinetobacter baumannii NOT DETECTED NOT DETECTED Final   Enterobacteriaceae species NOT DETECTED NOT DETECTED Final   Enterobacter cloacae complex NOT DETECTED NOT DETECTED Final   Escherichia coli NOT DETECTED NOT DETECTED Final   Klebsiella oxytoca NOT DETECTED NOT DETECTED Final   Klebsiella pneumoniae NOT DETECTED NOT DETECTED Final   Proteus species NOT DETECTED NOT DETECTED Final   Serratia marcescens NOT DETECTED NOT DETECTED Final   Carbapenem resistance NOT DETECTED NOT DETECTED Final   Haemophilus influenzae NOT DETECTED NOT DETECTED Final   Neisseria meningitidis NOT DETECTED NOT DETECTED Final   Pseudomonas aeruginosa NOT DETECTED NOT DETECTED Final   Candida albicans NOT DETECTED NOT DETECTED Final   Candida glabrata NOT DETECTED NOT DETECTED Final   Candida krusei NOT DETECTED NOT DETECTED Final   Candida parapsilosis NOT DETECTED NOT DETECTED Final   Candida tropicalis NOT DETECTED NOT DETECTED Final    Comment: Performed at [facility]  MRSA PCR Screening     Status: None   Collection Time: 09/11/15  4:51 AM  Result Value Ref Range Status   MRSA by PCR NEGATIVE NEGATIVE Final     Comment:        The  GeneXpert MRSA Assay (FDA approved for NASAL specimens only), is one component of a comprehensive MRSA colonization surveillance program. It is not intended to diagnose MRSA infection nor to guide or monitor treatment for MRSA infections.     Radiology Reports Ct Abdomen Pelvis Wo Contrast  Result Date: 09/07/2015 CLINICAL DATA:  Patient complains of lower back pain with burning sensation while urinating. States urinary frequency. Also complains of general body aches. Symptoms started 1 week ago. EXAM: CT ABDOMEN AND PELVIS WITHOUT CONTRAST TECHNIQUE: Multidetector CT imaging of the abdomen and pelvis was performed following the standard protocol without IV contrast. COMPARISON:  12/17/2013 FINDINGS: Mild dependent changes in the lung bases. Kidneys are symmetrical in size and shape. No hydronephrosis or hydroureter. No renal, ureteral, or bladder stones. Bladder wall is not thickened. The unenhanced appearance of the liver, spleen, gallbladder, pancreas, adrenal glands, abdominal aorta, inferior vena cava, and retroperitoneal lymph nodes is unremarkable. Stomach, small bowel, and colon are not abnormally distended. No free air or free fluid in the abdomen. Abdominal wall musculature appears intact. Pelvis: Appendix is normal. Prostate gland is not enlarged. No free or loculated pelvic fluid collections. No pelvic mass or lymphadenopathy. Small right inguinal hernia containing fat. No destructive bone lesions. IMPRESSION: No renal or ureteral stone or obstruction. No acute process demonstrated in the abdomen or pelvis on noncontrast imaging. Small right inguinal hernia containing fat. Electronically Signed   By: Burman NievesWilliam  Stevens M.D.   On: 09/07/2015 23:01   Dg Chest 2 View  Result Date: 09/07/2015 CLINICAL DATA:  45 y/o M; lower back pain, left shoulder pain, and shortness of breath for 1 week. EXAM: CHEST  2 VIEW COMPARISON:  04/08/2014 chest radiograph. FINDINGS: Stable  cardiomegaly and mediastinal silhouette. Prominent epicardial fat pad. Clear lungs. No pleural effusion. No pneumothorax. Partially visualized anterior cervical fusion hardware no acute osseous abnormality is evident. IMPRESSION: No active cardiopulmonary disease. Electronically Signed   By: Mitzi HansenLance  Furusawa-Stratton M.D.   On: 09/07/2015 19:32   Dg Chest Port 1 View  Result Date: 09/11/2015 CLINICAL DATA:  Acute onset of shortness of breath and left-sided chest pain. Initial encounter. EXAM: PORTABLE CHEST 1 VIEW COMPARISON:  Chest radiograph performed 09/07/2015 FINDINGS: The lungs are well-aerated. Vascular congestion is noted. Mildly increased interstitial markings may reflect mild interstitial edema. There is no evidence of pleural effusion or pneumothorax. The cardiomediastinal silhouette is borderline normal in size. No acute osseous abnormalities are seen. Cervical spinal fusion hardware is partially imaged. IMPRESSION: Vascular congestion noted. Mildly increased interstitial markings may reflect mild interstitial edema, depending on the patient's symptoms. Electronically Signed   By: Roanna RaiderJeffery  Chang M.D.   On: 09/11/2015 00:34    Time Spent in minutes  30   Jordan Jensen M.D on 09/12/2015 at 9:05 AM  Between 7am to 7pm - Pager - [phone]  After 7pm go to [email or web address] - password [email or web address]  [facility] -  Office  [phone]

## 2015-09-12 NOTE — Progress Notes (Signed)
Jordan Jensen notified of pt's low blood pressure of 87/31 with a HR of 98.  Orders received.  Okay to give pain medicine since it is PO

## 2015-09-12 NOTE — Care Management Important Message (Signed)
Important Message  Patient Details  Name: Jordan Jensen MRN: [id number] Date of Birth: 1970-06-07   Medicare Important Message Given:  Yes    Savyon Loken, Chrystine Oiler, RN 09/12/2015, 12:59 PM

## 2015-09-12 NOTE — Progress Notes (Signed)
Jordan Jensen messaged because pt c/o pain with no pain medicine ordered.

## 2015-09-13 LAB — COMPREHENSIVE METABOLIC PANEL
ALBUMIN: 3.1 g/dL — AB (ref 3.5–5.0)
ALT: 24 U/L (ref 17–63)
ANION GAP: 4 — AB (ref 5–15)
AST: 19 U/L (ref 15–41)
Alkaline Phosphatase: 38 U/L (ref 38–126)
BUN: 7 mg/dL (ref 6–20)
CHLORIDE: 104 mmol/L (ref 101–111)
CO2: 26 mmol/L (ref 22–32)
Calcium: 8.6 mg/dL — ABNORMAL LOW (ref 8.9–10.3)
Creatinine, Ser: 0.93 mg/dL (ref 0.61–1.24)
GFR calc non Af Amer: >60 mL/min (ref >60)
GLUCOSE: 103 mg/dL — AB (ref 65–99)
POTASSIUM: 3.8 mmol/L (ref 3.5–5.1)
SODIUM: 134 mmol/L — AB (ref 135–145)
Total Bilirubin: 0.5 mg/dL (ref 0.3–1.2)
Total Protein: 6.9 g/dL (ref 6.5–8.1)

## 2015-09-13 LAB — CBC
HCT: 37.3 % — ABNORMAL LOW (ref 39.0–52.0)
Hemoglobin: 12.3 g/dL — ABNORMAL LOW (ref 13.0–17.0)
MCH: 30.1 pg (ref 26.0–34.0)
MCHC: 33 g/dL (ref 30.0–36.0)
MCV: 91.4 fL (ref 78.0–100.0)
PLATELETS: 124 10*3/uL — AB (ref 150–400)
RBC: 4.08 MIL/uL — AB (ref 4.22–5.81)
RDW: 13 % (ref 11.5–15.5)
WBC: 15.3 10*3/uL — ABNORMAL HIGH (ref 4.0–10.5)

## 2015-09-13 LAB — CULTURE, BLOOD (ROUTINE X 2)

## 2015-09-13 LAB — PROCALCITONIN: PROCALCITONIN: 12.03 ng/mL

## 2015-09-13 NOTE — Progress Notes (Signed)
Patient ID: Jordan Jensen, male   DOB: 04/09/1970, 45 y.o.   MRN: [id number]                                                                PROGRESS NOTE                                                                                                                                                                                                             Patient Demographics:    Jordan Jensen, is a 45 y.o. male, DOB - 1970/08/16, GMW:[id number]  Admit date - 09/10/2015   Admitting Physician Haydee Monica, MD  Outpatient Primary MD for the patient is [name], DO  LOS - 2  Outpatient Specialists:  Chief Complaint  Patient presents with  . Generalized Body Aches       Brief Narrative  45 y.o.malewith medical history significant of bilateral chronic leg lymphedema, CHF, MO, wheelchair dependent, recurrent cellulitis presentedwith sudden onset of chills. Pt came to ED and fever was over 103. Pt came to ED 4 days prior to hospital admissionwith concerns he had a uti, he was evaluated and noted not to have a uti, urine cx from that day grows out less than 10k colonies and was not further evaluated. Patient was found to have findings worrisome for LLE cellulitis with sepsis   Subjective:    Jordan Jensen today has been afebrile.  Leg redness improving,  Still present. Wbc improving  No headache, No chest pain, No abdominal pain - No Nausea, No new weakness tingling or numbness, No Cough - SOB.    Assessment  & Plan :    Principal Problem:   Sepsis due to cellulitis [facility]) Active Problems:   Obesity   Recurrent cellulitis of lower leg   Atypical chest pain   Nonischemic cardiomyopathy (HCC)   Pyrexia   1. LLE cellulitis with sepsis present on admission 1. LLE erythema, warmth, swelling noted on exam 2. Presented with leukocytosis, fevers, elevated lactic acid 3. Pt had been continued on empiric vanc and zosyn on admit, culture now gram positive cocci (streptococci) 4. Cellulitis  appears to be moderate nonpurulent in nature, thus will cont IV ancef 5. If cellulitis further improves, consider transition to keflex 6. Sepsis is improving 2. Chest pain  1. Stable at present 2. minimally elevated trop likely secondary to sepsis 3. Trop trending down 4. Cont to monitor 3. Fevers 1. Likely secondary to presenting sepsis with cellulitis 2. Improving 4. Leukocytosis 1. WBC improving 2. Have changed abx to ancef 3. Blood cx (strep) 4. Urine culture neg 5. Venous stasis 1. Pt uses compression stalkings at baseline, currently on hold given cellulitis 2. Monitor for now 6. Obesity 1. Stable at present 2. Continue to monitor  DVT prophylaxis:SCD's Code Status:Full Family Communication:w/ patient Disposition Plan:Possible d/c in 48-hrs  Lab Results  Component Value Date   PLT 124 (L) 09/13/2015    Antibiotics  :    Anti-infectives    Start     Dose/Rate Route Frequency Ordered Stop   09/12/15 0900  ceFAZolin (ANCEF) IVPB 2g/100 mL premix     2 g 200 mL/hr over 30 Minutes Intravenous Every 8 hours 09/12/15 0756     09/12/15 0300  vancomycin (VANCOCIN) 1,500 mg in sodium chloride 0.9 % 500 mL IVPB  Status:  Discontinued     1,500 mg 250 mL/hr over 120 Minutes Intravenous Every 8 hours 09/11/15 1841 09/12/15 0756   09/11/15 1815  vancomycin (VANCOCIN) 2,500 mg in sodium chloride 0.9 % 500 mL IVPB  Status:  Discontinued     2,500 mg 250 mL/hr over 120 Minutes Intravenous  Once 09/11/15 1812 09/12/15 0756   09/11/15 0930  ceFAZolin (ANCEF) IVPB 2g/100 mL premix  Status:  Discontinued     2 g 200 mL/hr over 30 Minutes Intravenous Every 8 hours 09/11/15 0906 09/11/15 1806   09/11/15 0900  piperacillin-tazobactam (ZOSYN) IVPB 3.375 g  Status:  Discontinued     3.375 g 12.5 mL/hr over 240 Minutes Intravenous Every 8 hours 09/11/15 0822 09/11/15 0906   09/11/15 0500  vancomycin (VANCOCIN) 1,500 mg in sodium chloride 0.9 % 500 mL IVPB  Status:  Discontinued       1,500 mg 250 mL/hr over 120 Minutes Intravenous  Once 09/11/15 0449 09/11/15 0906   09/11/15 0315  piperacillin-tazobactam (ZOSYN) IVPB 3.375 g     3.375 g 100 mL/hr over 30 Minutes Intravenous  Once 09/11/15 0306 09/11/15 0423   09/11/15 0315  vancomycin (VANCOCIN) IVPB 1000 mg/200 mL premix  Status:  Discontinued     1,000 mg 200 mL/hr over 60 Minutes Intravenous  Once 09/11/15 0306 09/11/15 0453   09/11/15 0130  cefTRIAXone (ROCEPHIN) 1 g in dextrose 5 % 50 mL IVPB     1 g 100 mL/hr over 30 Minutes Intravenous  Once 09/11/15 0119 09/11/15 0310   09/11/15 0100  cefTRIAXone (ROCEPHIN) 1 g in dextrose 5 % 50 mL IVPB     1 g 100 mL/hr over 30 Minutes Intravenous  Once 09/11/15 0052 09/11/15 0214        Objective:   Vitals:   09/12/15 0449 09/12/15 1449 09/12/15 2125 09/13/15 0500  BP: (!) 105/47 (!) 169/88 (!) 130/48 (!) 116/54  Pulse: 91 88 89 81  Resp:  20 18 18   Temp: 98.9 F (37.2 C) 99.4 F (37.4 C) 98 F (36.7 C) 98.9 F (37.2 C)  TempSrc: Oral  Oral Oral  SpO2: 95% 96% 95% 94%  Weight:      Height:        Wt Readings from Last 3 Encounters:  09/11/15 (!) 172.1 kg (379 lb 6.6 oz)  09/07/15 (!) 168.7 kg (372 lb)  12/08/14 (!) 168.7 kg (372 lb)     Intake/Output Summary (Last  24 hours) at 09/13/15 0817 Last data filed at 09/13/15 0529  Gross per 24 hour  Intake             1260 ml  Output             3525 ml  Net            -2265 ml     Physical Exam  Awake Alert, Oriented X 3, No new F.N deficits, Normal affect [place].AT,PERRAL Supple Neck,No JVD, No cervical lymphadenopathy appriciated.  Symmetrical Chest wall movement, Good air movement bilaterally, CTAB RRR,No Gallops,Rubs or new Murmurs, No Parasternal Heave +ve B.Sounds, Abd Soft, No tenderness, No organomegaly appriciated, No rebound - guarding or rigidity. No Cyanosis, Clubbing or edema, redness about 1/4 up to knee today.     Data Review:    CBC  Recent Labs Lab 09/07/15 1953  09/10/15 2357 09/11/15 0454 09/12/15 0603 09/13/15 0633  WBC 13.1* 21.6* 25.4* 21.2* 15.3*  HGB 14.7 14.8 12.5* 12.1* 12.3*  HCT 43.8 44.0 37.7* 37.5* 37.3*  PLT 160 143* 137* 122* 124*  MCV 92.0 91.9 93.3 92.8 91.4  MCH 30.9 30.9 30.9 30.0 30.1  MCHC 33.6 33.6 33.2 32.3 33.0  RDW 12.5 12.6 12.5 13.2 13.0  LYMPHSABS 2.7 0.8  --   --   --   MONOABS 0.8 0.7  --   --   --   EOSABS 0.3 0.0  --   --   --   BASOSABS 0.0 0.0  --   --   --     Chemistries   Recent Labs Lab 09/07/15 1953 09/10/15 2357 09/11/15 0454 09/12/15 0603 09/13/15 0633  NA 137 137 138 136 134*  K 4.3 4.0 3.4* 3.8 3.8  CL 101 100* 106 105 104  CO2 29 28 22 24 26   GLUCOSE 91 118* 104* 104* 103*  BUN 11 12 13 10 7   CREATININE 1.01 1.10 1.14 0.93 0.93  CALCIUM 9.5 9.4 8.1* 8.4* 8.6*  AST 34 63*  --   --  19  ALT 36 58  --   --  24  ALKPHOS 52 51  --   --  38  BILITOT 0.8 0.8  --   --  0.5   ------------------------------------------------------------------------------------------------------------------ No results for input(s): CHOL, HDL, LDLCALC, TRIG, CHOLHDL, LDLDIRECT in the last 72 hours.  Lab Results  Component Value Date   HGBA1C 5.3 08/07/2014   ------------------------------------------------------------------------------------------------------------------ No results for input(s): TSH, T4TOTAL, T3FREE, THYROIDAB in the last 72 hours.  Invalid input(s): FREET3 ------------------------------------------------------------------------------------------------------------------ No results for input(s): VITAMINB12, FOLATE, FERRITIN, TIBC, IRON, RETICCTPCT in the last 72 hours.  Coagulation profile No results for input(s): INR, PROTIME in the last 168 hours.  No results for input(s): DDIMER in the last 72 hours.  Cardiac Enzymes  Recent Labs Lab 09/11/15 0454 09/11/15 1051 09/11/15 1651  TROPONINI 0.07* 0.03* 0.03*    ------------------------------------------------------------------------------------------------------------------    Component Value Date/Time   BNP 46.0 09/10/2015 0011    Inpatient Medications  Scheduled Meds: . aspirin EC  81 mg Oral Daily  .  ceFAZolin (ANCEF) IV  2 g Intravenous Q8H   Continuous Infusions:  PRN Meds:.oxyCODONE, traMADol  Micro Results Recent Results (from the past 240 hour(s))  Urine culture     Status: Abnormal   Collection Time: 09/07/15  8:20 PM  Result Value Ref Range Status   Specimen Description URINE, CLEAN CATCH  Final   Special Requests NONE  Final   Culture (A)  Final    <10,000 COLONIES/mL INSIGNIFICANT GROWTH Performed at [facility]    Report Status 09/10/2015 FINAL  Final  Culture, blood (routine x 2)     Status: Abnormal (Preliminary result)   Collection Time: 09/11/15 12:36 AM  Result Value Ref Range Status   Specimen Description BLOOD LEFT ANTECUBITAL  Final   Special Requests BOTTLES DRAWN AEROBIC ONLY 4CC  Final   Culture  Setup Time   Final    GRAM POSITIVE COCCI IN CHAINS RECOVERED FROM THE AEROBIC BOTTLE Gram Stain Report Called to,Read Back By and Verified With: HAWKINS,M. AT 1803 ON 09/11/2015 BY BAUGHAM,M. Performed at [facility]    Culture (A)  Final    STREPTOCOCCUS AGALACTIAE SUSCEPTIBILITIES TO FOLLOW Performed at [facility]    Report Status PENDING  Incomplete  Culture, blood (routine x 2)     Status: Abnormal (Preliminary result)   Collection Time: 09/11/15 12:53 AM  Result Value Ref Range Status   Specimen Description BLOOD RIGHT HAND  Final   Special Requests BOTTLES DRAWN AEROBIC AND ANAEROBIC 4CC EACH  Final   Culture  Setup Time   Final    GRAM POSITIVE COCCI IN CHAINS RECOVERED FROM BOTH THE AEROBIC AND THE ANAEROBIC BOTTLES Gram Stain Report Called to,Read Back By and Verified With: HAWKINS,M. AT 1803 ON 09/11/2015 BY BAUGHAM,M. Performed at [facility] Organism ID to  follow CRITICAL RESULT CALLED TO, READ BACK BY AND VERIFIED WITH: C ROSE RN [id number] 09/11/15 A BROWNING    Culture (A)  Final    GROUP B STREP(S.AGALACTIAE)ISOLATED SUSCEPTIBILITIES TO FOLLOW Performed at [facility]    Report Status PENDING  Incomplete  Blood Culture ID Panel (Reflexed)     Status: Abnormal   Collection Time: 09/11/15 12:53 AM  Result Value Ref Range Status   Enterococcus species NOT DETECTED NOT DETECTED Final   Vancomycin resistance NOT DETECTED NOT DETECTED Final   Listeria monocytogenes NOT DETECTED NOT DETECTED Final   Staphylococcus species NOT DETECTED NOT DETECTED Final   Staphylococcus aureus NOT DETECTED NOT DETECTED Final   Methicillin resistance NOT DETECTED NOT DETECTED Final   Streptococcus species DETECTED (A) NOT DETECTED Final    Comment: CRITICAL RESULT CALLED TO, READ BACK BY AND VERIFIED WITH: C ROSE RN [id number] 09/11/15 A BROWNING    Streptococcus agalactiae DETECTED (A) NOT DETECTED Final    Comment: CRITICAL RESULT CALLED TO, READ BACK BY AND VERIFIED WITH: C ROSE RN [id number] 09/11/15 A BROWNING    Streptococcus pneumoniae NOT DETECTED NOT DETECTED Final   Streptococcus pyogenes NOT DETECTED NOT DETECTED Final   Acinetobacter baumannii NOT DETECTED NOT DETECTED Final   Enterobacteriaceae species NOT DETECTED NOT DETECTED Final   Enterobacter cloacae complex NOT DETECTED NOT DETECTED Final   Escherichia coli NOT DETECTED NOT DETECTED Final   Klebsiella oxytoca NOT DETECTED NOT DETECTED Final   Klebsiella pneumoniae NOT DETECTED NOT DETECTED Final   Proteus species NOT DETECTED NOT DETECTED Final   Serratia marcescens NOT DETECTED NOT DETECTED Final   Carbapenem resistance NOT DETECTED NOT DETECTED Final   Haemophilus influenzae NOT DETECTED NOT DETECTED Final   Neisseria meningitidis NOT DETECTED NOT DETECTED Final   Pseudomonas aeruginosa NOT DETECTED NOT DETECTED Final   Candida albicans NOT DETECTED NOT DETECTED Final   Candida glabrata NOT  DETECTED NOT DETECTED Final   Candida krusei NOT DETECTED NOT DETECTED Final   Candida parapsilosis NOT DETECTED NOT DETECTED Final   Candida tropicalis NOT DETECTED  NOT DETECTED Final    Comment: Performed at [facility]  Urine culture     Status: Abnormal   Collection Time: 09/11/15  2:06 AM  Result Value Ref Range Status   Specimen Description URINE, CLEAN CATCH  Final   Special Requests NONE  Final   Culture (A)  Final    <10,000 COLONIES/mL INSIGNIFICANT GROWTH Performed at [facility]    Report Status 09/12/2015 FINAL  Final  MRSA PCR Screening     Status: None   Collection Time: 09/11/15  4:51 AM  Result Value Ref Range Status   MRSA by PCR NEGATIVE NEGATIVE Final    Comment:        The GeneXpert MRSA Assay (FDA approved for NASAL specimens only), is one component of a comprehensive MRSA colonization surveillance program. It is not intended to diagnose MRSA infection nor to guide or monitor treatment for MRSA infections.     Radiology Reports Ct Abdomen Pelvis Wo Contrast  Result Date: 09/07/2015 CLINICAL DATA:  Patient complains of lower back pain with burning sensation while urinating. States urinary frequency. Also complains of general body aches. Symptoms started 1 week ago. EXAM: CT ABDOMEN AND PELVIS WITHOUT CONTRAST TECHNIQUE: Multidetector CT imaging of the abdomen and pelvis was performed following the standard protocol without IV contrast. COMPARISON:  12/17/2013 FINDINGS: Mild dependent changes in the lung bases. Kidneys are symmetrical in size and shape. No hydronephrosis or hydroureter. No renal, ureteral, or bladder stones. Bladder wall is not thickened. The unenhanced appearance of the liver, spleen, gallbladder, pancreas, adrenal glands, abdominal aorta, inferior vena cava, and retroperitoneal lymph nodes is unremarkable. Stomach, small bowel, and colon are not abnormally distended. No free air or free fluid in the abdomen. Abdominal wall  musculature appears intact. Pelvis: Appendix is normal. Prostate gland is not enlarged. No free or loculated pelvic fluid collections. No pelvic mass or lymphadenopathy. Small right inguinal hernia containing fat. No destructive bone lesions. IMPRESSION: No renal or ureteral stone or obstruction. No acute process demonstrated in the abdomen or pelvis on noncontrast imaging. Small right inguinal hernia containing fat. Electronically Signed   By: Burman Nieves M.D.   On: 09/07/2015 23:01   Dg Chest 2 View  Result Date: 09/07/2015 CLINICAL DATA:  45 y/o M; lower back pain, left shoulder pain, and shortness of breath for 1 week. EXAM: CHEST  2 VIEW COMPARISON:  04/08/2014 chest radiograph. FINDINGS: Stable cardiomegaly and mediastinal silhouette. Prominent epicardial fat pad. Clear lungs. No pleural effusion. No pneumothorax. Partially visualized anterior cervical fusion hardware no acute osseous abnormality is evident. IMPRESSION: No active cardiopulmonary disease. Electronically Signed   By: Mitzi Hansen M.D.   On: 09/07/2015 19:32   Dg Chest Port 1 View  Result Date: 09/11/2015 CLINICAL DATA:  Acute onset of shortness of breath and left-sided chest pain. Initial encounter. EXAM: PORTABLE CHEST 1 VIEW COMPARISON:  Chest radiograph performed 09/07/2015 FINDINGS: The lungs are well-aerated. Vascular congestion is noted. Mildly increased interstitial markings may reflect mild interstitial edema. There is no evidence of pleural effusion or pneumothorax. The cardiomediastinal silhouette is borderline normal in size. No acute osseous abnormalities are seen. Cervical spinal fusion hardware is partially imaged. IMPRESSION: Vascular congestion noted. Mildly increased interstitial markings may reflect mild interstitial edema, depending on the patient's symptoms. Electronically Signed   By: Roanna Raider M.D.   On: 09/11/2015 00:34    Time Spent in minutes  30   Pearson Grippe M.D on 09/13/2015 at 8:17  AM  Between 7am to 7pm - Pager - [phone]  After 7pm go to [email or web address] - password [facility]  [facility] -  Office  [phone]

## 2015-09-13 NOTE — Consult Note (Signed)
   [facility] CM Inpatient Consult   09/13/2015  Jordan Jensen 29-Nov-1970 [id number]  Spoke with patient at bedside regarding [facility] services. Patient does not want to participate with [facility] at this time. Patient given [facility] brochure and contact information for future reference, voices appreciation of information.    Inpatient case manager aware that patient offered [facility] case management services but declined.   Of note, [facility] Care Management services would not replace or interfere with any services that are arranged by inpatient case management or social work. For additional questions or referrals please contact:   Alben Spittle. Albertha Ghee, RN, BSN, [facility]   [facility] Liaison [phone]

## 2015-09-14 MED ORDER — CEPHALEXIN 500 MG PO CAPS
500.0000 mg | ORAL_CAPSULE | Freq: Four times a day (QID) | ORAL | Status: DC
  Administered 2015-09-14 (×3): 500 mg via ORAL
  Filled 2015-09-14 (×3): qty 1

## 2015-09-14 MED ORDER — CEPHALEXIN 500 MG PO CAPS: 500.0000 mg | ORAL_CAPSULE | Freq: Four times a day (QID) | ORAL | 0 refills | Status: AC

## 2015-09-15 NOTE — Progress Notes (Signed)
Patient discharged home, IV removed - WNL.  Reviewed DC instructions and medications.  Instructed to follow up with PCP.  No questions, verbalized understanding.  Assisted off unit via WC in NAD.

## 2015-09-16 DIAGNOSIS — I509 Heart failure, unspecified: Secondary | ICD-10-CM | POA: Diagnosis not present

## 2015-09-16 DIAGNOSIS — L03116 Cellulitis of left lower limb: Secondary | ICD-10-CM | POA: Diagnosis not present

## 2015-09-16 DIAGNOSIS — I890 Lymphedema, not elsewhere classified: Secondary | ICD-10-CM | POA: Diagnosis not present

## 2015-09-16 DIAGNOSIS — B955 Unspecified streptococcus as the cause of diseases classified elsewhere: Secondary | ICD-10-CM | POA: Diagnosis not present

## 2015-09-16 DIAGNOSIS — I429 Cardiomyopathy, unspecified: Secondary | ICD-10-CM | POA: Diagnosis not present

## 2015-09-16 DIAGNOSIS — Z792 Long term (current) use of antibiotics: Secondary | ICD-10-CM | POA: Diagnosis not present

## 2015-09-16 DIAGNOSIS — Z87891 Personal history of nicotine dependence: Secondary | ICD-10-CM | POA: Diagnosis not present

## 2015-09-16 DIAGNOSIS — I110 Hypertensive heart disease with heart failure: Secondary | ICD-10-CM | POA: Diagnosis not present

## 2015-09-21 ENCOUNTER — Ambulatory Visit: Payer: Commercial Managed Care - HMO | Admitting: Family Medicine

## 2015-09-27 DIAGNOSIS — I509 Heart failure, unspecified: Secondary | ICD-10-CM | POA: Diagnosis not present

## 2015-09-27 DIAGNOSIS — Z792 Long term (current) use of antibiotics: Secondary | ICD-10-CM | POA: Diagnosis not present

## 2015-09-27 DIAGNOSIS — I110 Hypertensive heart disease with heart failure: Secondary | ICD-10-CM | POA: Diagnosis not present

## 2015-09-27 DIAGNOSIS — L03116 Cellulitis of left lower limb: Secondary | ICD-10-CM | POA: Diagnosis not present

## 2015-09-27 DIAGNOSIS — I429 Cardiomyopathy, unspecified: Secondary | ICD-10-CM | POA: Diagnosis not present

## 2015-09-27 DIAGNOSIS — I890 Lymphedema, not elsewhere classified: Secondary | ICD-10-CM | POA: Diagnosis not present

## 2015-09-27 DIAGNOSIS — B955 Unspecified streptococcus as the cause of diseases classified elsewhere: Secondary | ICD-10-CM | POA: Diagnosis not present

## 2015-09-27 DIAGNOSIS — Z87891 Personal history of nicotine dependence: Secondary | ICD-10-CM | POA: Diagnosis not present

## 2015-10-03 DIAGNOSIS — I429 Cardiomyopathy, unspecified: Secondary | ICD-10-CM | POA: Diagnosis not present

## 2015-10-03 DIAGNOSIS — Z87891 Personal history of nicotine dependence: Secondary | ICD-10-CM | POA: Diagnosis not present

## 2015-10-03 DIAGNOSIS — Z792 Long term (current) use of antibiotics: Secondary | ICD-10-CM | POA: Diagnosis not present

## 2015-10-03 DIAGNOSIS — B955 Unspecified streptococcus as the cause of diseases classified elsewhere: Secondary | ICD-10-CM | POA: Diagnosis not present

## 2015-10-03 DIAGNOSIS — I890 Lymphedema, not elsewhere classified: Secondary | ICD-10-CM | POA: Diagnosis not present

## 2015-10-03 DIAGNOSIS — I110 Hypertensive heart disease with heart failure: Secondary | ICD-10-CM | POA: Diagnosis not present

## 2015-10-03 DIAGNOSIS — L03116 Cellulitis of left lower limb: Secondary | ICD-10-CM | POA: Diagnosis not present

## 2015-10-03 DIAGNOSIS — I509 Heart failure, unspecified: Secondary | ICD-10-CM | POA: Diagnosis not present

## 2015-10-08 ENCOUNTER — Ambulatory Visit (INDEPENDENT_AMBULATORY_CARE_PROVIDER_SITE_OTHER): Payer: Commercial Managed Care - HMO | Admitting: Family Medicine

## 2015-10-08 ENCOUNTER — Encounter: Payer: Self-pay | Admitting: Family Medicine

## 2015-10-08 VITALS — BP 123/76 | HR 84 | Temp 98.7°F | Ht 71.0 in | Wt 366.6 lb

## 2015-10-08 DIAGNOSIS — Z23 Encounter for immunization: Secondary | ICD-10-CM

## 2015-10-08 DIAGNOSIS — L03119 Cellulitis of unspecified part of limb: Secondary | ICD-10-CM

## 2015-10-08 DIAGNOSIS — L03116 Cellulitis of left lower limb: Secondary | ICD-10-CM

## 2015-10-08 DIAGNOSIS — E669 Obesity, unspecified: Secondary | ICD-10-CM | POA: Diagnosis not present

## 2015-10-08 DIAGNOSIS — IMO0001 Reserved for inherently not codable concepts without codable children: Secondary | ICD-10-CM

## 2015-10-08 DIAGNOSIS — Z6841 Body Mass Index (BMI) 40.0 and over, adult: Secondary | ICD-10-CM | POA: Diagnosis not present

## 2015-10-08 DIAGNOSIS — I890 Lymphedema, not elsewhere classified: Secondary | ICD-10-CM

## 2015-10-08 LAB — BASIC METABOLIC PANEL WITH GFR
BUN: 14 mg/dL (ref 7–25)
CALCIUM: 9.4 mg/dL (ref 8.6–10.3)
CO2: 26 mmol/L (ref 20–31)
Chloride: 100 mmol/L (ref 98–110)
Creat: 1.04 mg/dL (ref 0.60–1.35)
GFR, EST NON AFRICAN AMERICAN: 86 mL/min (ref >=60)
GFR, Est African American: >89 mL/min (ref >=60)
GLUCOSE: 84 mg/dL (ref 65–99)
POTASSIUM: 4.2 mmol/L (ref 3.5–5.3)
Sodium: 138 mmol/L (ref 135–146)

## 2015-10-08 MED ORDER — METOPROLOL SUCCINATE ER 25 MG PO TB24: 12.5000 mg | ORAL_TABLET | Freq: Every day | ORAL | 3 refills | Status: DC

## 2015-10-08 MED ORDER — FUROSEMIDE 40 MG PO TABS: 40.0000 mg | ORAL_TABLET | Freq: Every day | ORAL | 3 refills | Status: DC

## 2015-10-08 MED ORDER — FAMOTIDINE 20 MG PO TABS: 20.0000 mg | ORAL_TABLET | Freq: Two times a day (BID) | ORAL | 3 refills | Status: DC

## 2015-10-08 MED ORDER — ASPIRIN 81 MG PO TBEC: 81.0000 mg | DELAYED_RELEASE_TABLET | Freq: Every day | ORAL | 3 refills | Status: DC

## 2015-10-08 NOTE — Assessment & Plan Note (Signed)
Referral to Nutrition placed and Dr. Gerilyn Pilgrim card given to patient today.

## 2015-10-08 NOTE — Progress Notes (Signed)
    Subjective:  Jordan Jensen is a 45 y.o. male who presents to the [facility] today for a hospital followup  HPI: L LE cellulitis hospital followup Feel legs are at his baseline and is overall doing well. States was recently hospitalized 3 weeks ago for L LE cellulitis in the setting of chronic lymphedema. Has finished his antibiotics, has had home health nursing and feels cellulitis is complete resolved. Was told by home health RN that could start spacing out his visits to every other week. Has h/o recurrent cellulitis, used to get twice yearly but before this last hospitalization went almost a year without needing antibiotics, feels that going to lymphedema clinic was helpful for prevention.Is asking to go back to lymphedema clinic (used to go to one in [place] and another one at [employer]).   Health Maintenance Discussed obesity, wants to see nutritionist again. States found previous appointments were very helpful and helped him loose a lot of weight. Is amenable to flu shot today.  CHF Has not seen cardiology since 2015. States used to see in [place] but was lost to followup after changing his phone number.  ROS: Per HPI  Objective:  Physical Exam: BP 123/76   Pulse 84   Temp 98.7 F (37.1 C) (Oral)   Ht 5\' 11"  (1.803 m)   Wt (!) 166.3 kg (366 lb 9.6 oz)   BMI 51.13 kg/m   Gen: NAD, resting comfortably CV: RRR with no murmurs appreciated Pulm: NWOB, CTAB with no crackles, wheezes, or rhonchi GI: Normal bowel sounds present. Soft, Nontender, Nondistended. Extremities: LE edema b/l with compression hose in place. Under compression hose has some chronic venous changes without abrasions or significant erythema. No visible skin breakdown.   Assessment/Plan:  Lymphedema Patient to call and reestablish at lymphedema clinic of his choosing.  Cellulitis of lower extremity Cellulitis resolved. Check BMP, CBC today to check for resolution of abnormal bloodwork from recent   hospital  stay  Obesity Referral to Nutrition placed and Dr. Gerilyn Pilgrim card given to patient today.  Health maintenance  Flu shot given today. HIV screen obtained today.  Medications refilled today. Patient to follow up in 1 month to discuss heart failure.  [name], DO PGY-1, [facility] 10/08/2015 3:28 PM

## 2015-10-08 NOTE — Assessment & Plan Note (Signed)
Cellulitis resolved. Check BMP, CBC today to check for resolution of abnormal bloodwork from recent   hospital stay

## 2015-10-08 NOTE — Assessment & Plan Note (Signed)
Patient to call and reestablish at lymphedema clinic of his choosing.

## 2015-10-08 NOTE — Patient Instructions (Addendum)
Thank you for being seen today for a hospital follow up.  - For your lymphedema: please call and reestablish care at lymphedema clinic, the clinic contact us or you can call us if they need a referral from [place] to reestablish you as a patient  - We are checking some labs today, and someone will call you or send you a letter with the results when they are available.  - Please call Dr Gerilyn Pilgrim our nutritionist to set up an appointment - Thank you for getting your flu shot today   I will see you back in 1 month to discuss your heart failure

## 2015-10-09 LAB — CBC
HEMATOCRIT: 43 % (ref 38.5–50.0)
HEMOGLOBIN: 14.3 g/dL (ref 13.2–17.1)
MCH: 29.4 pg (ref 27.0–33.0)
MCHC: 33.3 g/dL (ref 32.0–36.0)
MCV: 88.3 fL (ref 80.0–100.0)
MPV: 11.8 fL (ref 7.5–12.5)
Platelets: 189 10*3/uL (ref 140–400)
RBC: 4.87 MIL/uL (ref 4.20–5.80)
RDW: 13.2 % (ref 11.0–15.0)
WBC: 10.4 10*3/uL (ref 3.8–10.8)

## 2015-10-09 LAB — HIV ANTIBODY (ROUTINE TESTING W REFLEX): HIV 1&2 Ab, 4th Generation: NONREACTIVE

## 2015-10-10 DIAGNOSIS — L03116 Cellulitis of left lower limb: Secondary | ICD-10-CM | POA: Diagnosis not present

## 2015-10-10 DIAGNOSIS — I429 Cardiomyopathy, unspecified: Secondary | ICD-10-CM | POA: Diagnosis not present

## 2015-10-10 DIAGNOSIS — I509 Heart failure, unspecified: Secondary | ICD-10-CM | POA: Diagnosis not present

## 2015-10-10 DIAGNOSIS — Z87891 Personal history of nicotine dependence: Secondary | ICD-10-CM | POA: Diagnosis not present

## 2015-10-10 DIAGNOSIS — I890 Lymphedema, not elsewhere classified: Secondary | ICD-10-CM | POA: Diagnosis not present

## 2015-10-10 DIAGNOSIS — Z792 Long term (current) use of antibiotics: Secondary | ICD-10-CM | POA: Diagnosis not present

## 2015-10-10 DIAGNOSIS — I110 Hypertensive heart disease with heart failure: Secondary | ICD-10-CM | POA: Diagnosis not present

## 2015-10-10 DIAGNOSIS — B955 Unspecified streptococcus as the cause of diseases classified elsewhere: Secondary | ICD-10-CM | POA: Diagnosis not present

## 2015-10-18 DIAGNOSIS — Z87891 Personal history of nicotine dependence: Secondary | ICD-10-CM | POA: Diagnosis not present

## 2015-10-18 DIAGNOSIS — I110 Hypertensive heart disease with heart failure: Secondary | ICD-10-CM | POA: Diagnosis not present

## 2015-10-18 DIAGNOSIS — I509 Heart failure, unspecified: Secondary | ICD-10-CM | POA: Diagnosis not present

## 2015-10-18 DIAGNOSIS — Z792 Long term (current) use of antibiotics: Secondary | ICD-10-CM | POA: Diagnosis not present

## 2015-10-18 DIAGNOSIS — I890 Lymphedema, not elsewhere classified: Secondary | ICD-10-CM | POA: Diagnosis not present

## 2015-10-18 DIAGNOSIS — I429 Cardiomyopathy, unspecified: Secondary | ICD-10-CM | POA: Diagnosis not present

## 2015-10-18 DIAGNOSIS — L03116 Cellulitis of left lower limb: Secondary | ICD-10-CM | POA: Diagnosis not present

## 2015-10-18 DIAGNOSIS — B955 Unspecified streptococcus as the cause of diseases classified elsewhere: Secondary | ICD-10-CM | POA: Diagnosis not present

## 2015-10-22 DIAGNOSIS — I429 Cardiomyopathy, unspecified: Secondary | ICD-10-CM | POA: Diagnosis not present

## 2015-10-22 DIAGNOSIS — I890 Lymphedema, not elsewhere classified: Secondary | ICD-10-CM | POA: Diagnosis not present

## 2015-10-22 DIAGNOSIS — B955 Unspecified streptococcus as the cause of diseases classified elsewhere: Secondary | ICD-10-CM | POA: Diagnosis not present

## 2015-10-22 DIAGNOSIS — I110 Hypertensive heart disease with heart failure: Secondary | ICD-10-CM | POA: Diagnosis not present

## 2015-10-22 DIAGNOSIS — Z87891 Personal history of nicotine dependence: Secondary | ICD-10-CM | POA: Diagnosis not present

## 2015-10-22 DIAGNOSIS — I509 Heart failure, unspecified: Secondary | ICD-10-CM | POA: Diagnosis not present

## 2015-10-22 DIAGNOSIS — Z792 Long term (current) use of antibiotics: Secondary | ICD-10-CM | POA: Diagnosis not present

## 2015-10-22 DIAGNOSIS — L03116 Cellulitis of left lower limb: Secondary | ICD-10-CM | POA: Diagnosis not present

## 2015-11-01 DIAGNOSIS — I890 Lymphedema, not elsewhere classified: Secondary | ICD-10-CM | POA: Diagnosis not present

## 2015-11-01 DIAGNOSIS — Z792 Long term (current) use of antibiotics: Secondary | ICD-10-CM | POA: Diagnosis not present

## 2015-11-01 DIAGNOSIS — I429 Cardiomyopathy, unspecified: Secondary | ICD-10-CM | POA: Diagnosis not present

## 2015-11-01 DIAGNOSIS — I110 Hypertensive heart disease with heart failure: Secondary | ICD-10-CM | POA: Diagnosis not present

## 2015-11-01 DIAGNOSIS — Z87891 Personal history of nicotine dependence: Secondary | ICD-10-CM | POA: Diagnosis not present

## 2015-11-01 DIAGNOSIS — B955 Unspecified streptococcus as the cause of diseases classified elsewhere: Secondary | ICD-10-CM | POA: Diagnosis not present

## 2015-11-01 DIAGNOSIS — I509 Heart failure, unspecified: Secondary | ICD-10-CM | POA: Diagnosis not present

## 2015-11-01 DIAGNOSIS — L03116 Cellulitis of left lower limb: Secondary | ICD-10-CM | POA: Diagnosis not present
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
## 2015-11-09 ENCOUNTER — Telehealth: Payer: Self-pay | Admitting: Family Medicine

## 2015-11-09 DIAGNOSIS — R319 Hematuria, unspecified: Secondary | ICD-10-CM | POA: Diagnosis not present

## 2015-11-09 DIAGNOSIS — I429 Cardiomyopathy, unspecified: Secondary | ICD-10-CM | POA: Diagnosis not present

## 2015-11-09 DIAGNOSIS — Z792 Long term (current) use of antibiotics: Secondary | ICD-10-CM | POA: Diagnosis not present

## 2015-11-09 DIAGNOSIS — I890 Lymphedema, not elsewhere classified: Secondary | ICD-10-CM | POA: Diagnosis not present

## 2015-11-09 DIAGNOSIS — R3915 Urgency of urination: Secondary | ICD-10-CM | POA: Diagnosis not present

## 2015-11-09 DIAGNOSIS — Z87891 Personal history of nicotine dependence: Secondary | ICD-10-CM | POA: Diagnosis not present

## 2015-11-09 DIAGNOSIS — M545 Low back pain: Secondary | ICD-10-CM | POA: Diagnosis not present

## 2015-11-09 DIAGNOSIS — R1084 Generalized abdominal pain: Secondary | ICD-10-CM | POA: Diagnosis not present

## 2015-11-09 DIAGNOSIS — Z79899 Other long term (current) drug therapy: Secondary | ICD-10-CM | POA: Diagnosis not present

## 2015-11-09 DIAGNOSIS — R197 Diarrhea, unspecified: Secondary | ICD-10-CM | POA: Diagnosis not present

## 2015-11-09 DIAGNOSIS — I110 Hypertensive heart disease with heart failure: Secondary | ICD-10-CM | POA: Diagnosis not present

## 2015-11-09 DIAGNOSIS — R350 Frequency of micturition: Secondary | ICD-10-CM | POA: Diagnosis not present

## 2015-11-09 DIAGNOSIS — L03116 Cellulitis of left lower limb: Secondary | ICD-10-CM | POA: Diagnosis not present

## 2015-11-09 DIAGNOSIS — B955 Unspecified streptococcus as the cause of diseases classified elsewhere: Secondary | ICD-10-CM | POA: Diagnosis not present

## 2015-11-09 DIAGNOSIS — I509 Heart failure, unspecified: Secondary | ICD-10-CM | POA: Diagnosis not present

## 2015-11-09 DIAGNOSIS — N23 Unspecified renal colic: Secondary | ICD-10-CM | POA: Diagnosis not present

## 2015-11-09 NOTE — Telephone Encounter (Signed)
Given fever of 101 and lower back pain, patient needs to be evaluated at urgent care or ED today. Tried to call patient but [phone] which was a wrong number and only reached voicemail without identifying information on [phone].

## 2015-11-09 NOTE — Telephone Encounter (Signed)
Will forward to MD to advise. Jazmin Hartsell,CMA  

## 2015-11-09 NOTE — Telephone Encounter (Signed)
Olegario Messier from [facility] called and would like to know if she can have verbal orders to do a UTI on the patient. She was out there for her visit and the patient is complaining of lower back pain with pressure. His urine has a bad odor. He has also had chills with a fever over 101 and he also passed a kidney stone last. Please call Olegario Messier with orders she can go this weekend or the first of the week is you want. [phone]. jw

## 2015-11-12 NOTE — Telephone Encounter (Signed)
LM for Jordan Jensen from [facility] asking her to call back.  No information on patient left since VM was unidentified. Shawndrea Rutkowski,CMA

## 2015-11-13 DIAGNOSIS — I509 Heart failure, unspecified: Secondary | ICD-10-CM | POA: Diagnosis not present

## 2015-11-13 DIAGNOSIS — L03116 Cellulitis of left lower limb: Secondary | ICD-10-CM | POA: Diagnosis not present

## 2015-11-13 DIAGNOSIS — Z792 Long term (current) use of antibiotics: Secondary | ICD-10-CM | POA: Diagnosis not present

## 2015-11-13 DIAGNOSIS — I110 Hypertensive heart disease with heart failure: Secondary | ICD-10-CM | POA: Diagnosis not present

## 2015-11-13 DIAGNOSIS — I890 Lymphedema, not elsewhere classified: Secondary | ICD-10-CM | POA: Diagnosis not present

## 2015-11-13 DIAGNOSIS — B955 Unspecified streptococcus as the cause of diseases classified elsewhere: Secondary | ICD-10-CM | POA: Diagnosis not present

## 2015-11-13 DIAGNOSIS — Z87891 Personal history of nicotine dependence: Secondary | ICD-10-CM | POA: Diagnosis not present

## 2015-11-13 DIAGNOSIS — I429 Cardiomyopathy, unspecified: Secondary | ICD-10-CM | POA: Diagnosis not present

## 2015-11-19 ENCOUNTER — Ambulatory Visit: Payer: Commercial Managed Care - HMO | Admitting: Family Medicine

## 2015-11-27 ENCOUNTER — Ambulatory Visit: Payer: Commercial Managed Care - HMO | Admitting: Family Medicine

## 2015-12-04 ENCOUNTER — Ambulatory Visit (INDEPENDENT_AMBULATORY_CARE_PROVIDER_SITE_OTHER): Payer: Commercial Managed Care - HMO | Admitting: Family Medicine

## 2015-12-04 ENCOUNTER — Encounter: Payer: Self-pay | Admitting: Family Medicine

## 2015-12-04 VITALS — BP 110/80 | HR 92 | Temp 98.3°F | Ht 71.0 in | Wt 370.0 lb

## 2015-12-04 DIAGNOSIS — Z993 Dependence on wheelchair: Secondary | ICD-10-CM

## 2015-12-04 DIAGNOSIS — I5022 Chronic systolic (congestive) heart failure: Secondary | ICD-10-CM | POA: Diagnosis not present

## 2015-12-04 DIAGNOSIS — Z6841 Body Mass Index (BMI) 40.0 and over, adult: Secondary | ICD-10-CM

## 2015-12-04 DIAGNOSIS — IMO0001 Reserved for inherently not codable concepts without codable children: Secondary | ICD-10-CM

## 2015-12-04 DIAGNOSIS — E669 Obesity, unspecified: Secondary | ICD-10-CM
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
## 2015-12-04 NOTE — Assessment & Plan Note (Signed)
Provided patient with contact information for nutrition again today since he lost Dr Gerilyn Pilgrim card after last visit. Patient seems highly motivated

## 2015-12-04 NOTE — Assessment & Plan Note (Signed)
Doing well, recent BMP WNL. Continue toprolxl 25mg  daily. Will not start ACE/ARB since patient has intolerance and BP is under good control.
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
## 2015-12-05 ENCOUNTER — Telehealth: Payer: Self-pay | Admitting: Family Medicine

## 2015-12-05 NOTE — Telephone Encounter (Signed)
Pt called and needs [place] to fax orders to [facility] in [place] for OT and PT. jw

## 2015-12-06 ENCOUNTER — Other Ambulatory Visit: Payer: Self-pay | Admitting: Family Medicine

## 2015-12-06 DIAGNOSIS — M7989 Other specified soft tissue disorders: Secondary | ICD-10-CM

## 2015-12-06 DIAGNOSIS — Z87828 Personal history of other (healed) physical injury and trauma: Secondary | ICD-10-CM

## 2015-12-06 DIAGNOSIS — I890 Lymphedema, not elsewhere classified: Secondary | ICD-10-CM

## 2016-01-06 DIAGNOSIS — M5489 Other dorsalgia: Secondary | ICD-10-CM | POA: Diagnosis not present

## 2016-01-07 ENCOUNTER — Encounter (HOSPITAL_COMMUNITY): Payer: Self-pay | Admitting: *Deleted

## 2016-01-07 ENCOUNTER — Emergency Department (HOSPITAL_COMMUNITY): Payer: Medicare HMO

## 2016-01-07 ENCOUNTER — Inpatient Hospital Stay (HOSPITAL_COMMUNITY)
Admission: EM | Admit: 2016-01-07 | Discharge: 2016-01-11 | DRG: 603 | Disposition: A | Discharge status: Home / Self Care | Payer: Medicare HMO | Attending: Family Medicine | Admitting: Family Medicine

## 2016-01-07 DIAGNOSIS — Z809 Family history of malignant neoplasm, unspecified: Secondary | ICD-10-CM

## 2016-01-07 DIAGNOSIS — L03116 Cellulitis of left lower limb: Secondary | ICD-10-CM | POA: Diagnosis not present

## 2016-01-07 DIAGNOSIS — I890 Lymphedema, not elsewhere classified: Secondary | ICD-10-CM | POA: Diagnosis not present

## 2016-01-07 DIAGNOSIS — L02416 Cutaneous abscess of left lower limb: Secondary | ICD-10-CM

## 2016-01-07 DIAGNOSIS — I5020 Unspecified systolic (congestive) heart failure: Secondary | ICD-10-CM | POA: Diagnosis not present

## 2016-01-07 DIAGNOSIS — N179 Acute kidney failure, unspecified: Secondary | ICD-10-CM | POA: Diagnosis present

## 2016-01-07 DIAGNOSIS — R0600 Dyspnea, unspecified: Secondary | ICD-10-CM | POA: Diagnosis not present

## 2016-01-07 DIAGNOSIS — Z993 Dependence on wheelchair: Secondary | ICD-10-CM

## 2016-01-07 DIAGNOSIS — G959 Disease of spinal cord, unspecified: Secondary | ICD-10-CM | POA: Diagnosis not present

## 2016-01-07 DIAGNOSIS — R269 Unspecified abnormalities of gait and mobility: Secondary | ICD-10-CM

## 2016-01-07 DIAGNOSIS — M7989 Other specified soft tissue disorders: Secondary | ICD-10-CM | POA: Diagnosis not present

## 2016-01-07 DIAGNOSIS — I5022 Chronic systolic (congestive) heart failure: Secondary | ICD-10-CM | POA: Diagnosis not present

## 2016-01-07 DIAGNOSIS — I509 Heart failure, unspecified: Secondary | ICD-10-CM

## 2016-01-07 DIAGNOSIS — Z833 Family history of diabetes mellitus: Secondary | ICD-10-CM

## 2016-01-07 DIAGNOSIS — I10 Essential (primary) hypertension: Secondary | ICD-10-CM

## 2016-01-07 DIAGNOSIS — Z981 Arthrodesis status: Secondary | ICD-10-CM

## 2016-01-07 DIAGNOSIS — Z87891 Personal history of nicotine dependence: Secondary | ICD-10-CM | POA: Diagnosis not present

## 2016-01-07 DIAGNOSIS — J9811 Atelectasis: Secondary | ICD-10-CM | POA: Diagnosis not present

## 2016-01-07 DIAGNOSIS — I110 Hypertensive heart disease with heart failure: Secondary | ICD-10-CM | POA: Diagnosis not present

## 2016-01-07 HISTORY — DX: Cutaneous abscess of left lower limb: L02.416

## 2016-01-07 LAB — URINALYSIS, ROUTINE W REFLEX MICROSCOPIC
BACTERIA UA: NONE SEEN
Bilirubin Urine: NEGATIVE
GLUCOSE, UA: NEGATIVE mg/dL
Ketones, ur: NEGATIVE mg/dL
LEUKOCYTES UA: NEGATIVE
NITRITE: NEGATIVE
PROTEIN: 30 mg/dL — AB
SPECIFIC GRAVITY, URINE: 1.025 (ref 1.005–1.030)
pH: 5 (ref 5.0–8.0)

## 2016-01-07 LAB — COMPREHENSIVE METABOLIC PANEL
ALBUMIN: 3.3 g/dL — AB (ref 3.5–5.0)
ALK PHOS: 48 U/L (ref 38–126)
ALT: 39 U/L (ref 17–63)
AST: 34 U/L (ref 15–41)
Anion gap: 9 (ref 5–15)
BILIRUBIN TOTAL: 1.2 mg/dL (ref 0.3–1.2)
BUN: 14 mg/dL (ref 6–20)
CALCIUM: 9.3 mg/dL (ref 8.9–10.3)
CO2: 26 mmol/L (ref 22–32)
Chloride: 100 mmol/L — ABNORMAL LOW (ref 101–111)
Creatinine, Ser: 1.42 mg/dL — ABNORMAL HIGH (ref 0.61–1.24)
GFR calc Af Amer: >60 mL/min (ref >60)
GFR, EST NON AFRICAN AMERICAN: 58 mL/min — AB (ref >60)
GLUCOSE: 95 mg/dL (ref 65–99)
POTASSIUM: 3.9 mmol/L (ref 3.5–5.1)
Sodium: 135 mmol/L (ref 135–145)
TOTAL PROTEIN: 7.9 g/dL (ref 6.5–8.1)

## 2016-01-07 LAB — I-STAT TROPONIN, ED: TROPONIN I, POC: 0.06 ng/mL (ref 0.00–0.08)

## 2016-01-07 LAB — CBC WITH DIFFERENTIAL/PLATELET
BASOS ABS: 0 10*3/uL (ref 0.0–0.1)
Basophils Relative: 0 %
EOS ABS: 0 10*3/uL (ref 0.0–0.7)
Eosinophils Relative: 0 %
HEMATOCRIT: 42.7 % (ref 39.0–52.0)
Hemoglobin: 14.5 g/dL (ref 13.0–17.0)
LYMPHS ABS: 1.1 10*3/uL (ref 0.7–4.0)
Lymphocytes Relative: 4 %
MCH: 30.4 pg (ref 26.0–34.0)
MCHC: 34 g/dL (ref 30.0–36.0)
MCV: 89.5 fL (ref 78.0–100.0)
MONO ABS: 0.6 10*3/uL (ref 0.1–1.0)
MONOS PCT: 2 %
NEUTROS ABS: 26.5 10*3/uL — AB (ref 1.7–7.7)
Neutrophils Relative %: 94 %
Platelets: 153 10*3/uL (ref 150–400)
RBC: 4.77 MIL/uL (ref 4.22–5.81)
RDW: 13.3 % (ref 11.5–15.5)
WBC Morphology: INCREASED
WBC: 28.2 10*3/uL — AB (ref 4.0–10.5)

## 2016-01-07 LAB — I-STAT CG4 LACTIC ACID, ED: Lactic Acid, Venous: 1.49 mmol/L (ref 0.5–1.9)

## 2016-01-07 LAB — BRAIN NATRIURETIC PEPTIDE: B Natriuretic Peptide: 65.7 pg/mL (ref 0.0–100.0)

## 2016-01-07 MED ORDER — PIPERACILLIN-TAZOBACTAM 3.375 G IVPB 30 MIN
3.3750 g | Freq: Once | INTRAVENOUS | Status: AC
  Administered 2016-01-07: 3.375 g via INTRAVENOUS
  Filled 2016-01-07: qty 50

## 2016-01-07 MED ORDER — SODIUM CHLORIDE 0.9 % IV BOLUS (SEPSIS)
1000.0000 mL | Freq: Once | INTRAVENOUS | Status: AC
  Administered 2016-01-07: 1000 mL via INTRAVENOUS

## 2016-01-07 MED ORDER — VANCOMYCIN HCL IN DEXTROSE 1-5 GM/200ML-% IV SOLN
1000.0000 mg | Freq: Once | INTRAVENOUS | Status: AC
  Administered 2016-01-07: 1000 mg via INTRAVENOUS
  Filled 2016-01-07: qty 200

## 2016-01-07 NOTE — ED Triage Notes (Signed)
Pt states that he had a fever and back pain yesterday. Pt states that he also has swelling in his left leg is swollen and red as well. Pt states that he has hx of cellulitis. Pt also reports that his urine was dark yesterday.
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
## 2016-01-07 NOTE — ED Notes (Signed)
Patient transported to X-ray 

## 2016-01-08 DIAGNOSIS — I5022 Chronic systolic (congestive) heart failure: Secondary | ICD-10-CM

## 2016-01-08 DIAGNOSIS — L03116 Cellulitis of left lower limb: Principal | ICD-10-CM

## 2016-01-08 DIAGNOSIS — N179 Acute kidney failure, unspecified: Secondary | ICD-10-CM

## 2016-01-08 DIAGNOSIS — I890 Lymphedema, not elsewhere classified: Secondary | ICD-10-CM

## 2016-01-08 LAB — INFLUENZA PANEL BY PCR (TYPE A & B)
Influenza A By PCR: NEGATIVE
Influenza B By PCR: NEGATIVE

## 2016-01-08 LAB — BASIC METABOLIC PANEL WITH GFR
Anion gap: 8 (ref 5–15)
BUN: 12 mg/dL (ref 6–20)
CO2: 25 mmol/L (ref 22–32)
Calcium: 8.6 mg/dL — ABNORMAL LOW (ref 8.9–10.3)
Chloride: 102 mmol/L (ref 101–111)
Creatinine, Ser: 1.24 mg/dL (ref 0.61–1.24)
GFR calc Af Amer: >60 mL/min
GFR calc non Af Amer: >60 mL/min
Glucose, Bld: 138 mg/dL — ABNORMAL HIGH (ref 65–99)
Potassium: 3.4 mmol/L — ABNORMAL LOW (ref 3.5–5.1)
Sodium: 135 mmol/L (ref 135–145)

## 2016-01-08 LAB — CBC
HCT: 37.5 % — ABNORMAL LOW (ref 39.0–52.0)
Hemoglobin: 12.6 g/dL — ABNORMAL LOW (ref 13.0–17.0)
MCH: 29.9 pg (ref 26.0–34.0)
MCHC: 33.6 g/dL (ref 30.0–36.0)
MCV: 88.9 fL (ref 78.0–100.0)
Platelets: 142 10*3/uL — ABNORMAL LOW (ref 150–400)
RBC: 4.22 MIL/uL (ref 4.22–5.81)
RDW: 13.4 % (ref 11.5–15.5)
WBC: 22.6 10*3/uL — ABNORMAL HIGH (ref 4.0–10.5)

## 2016-01-08 MED ORDER — ACETAMINOPHEN 325 MG PO TABS
650.0000 mg | ORAL_TABLET | Freq: Four times a day (QID) | ORAL | Status: DC | PRN
  Administered 2016-01-08: 650 mg via ORAL
  Filled 2016-01-08: qty 2

## 2016-01-08 MED ORDER — METOPROLOL SUCCINATE ER 25 MG PO TB24
12.5000 mg | ORAL_TABLET | Freq: Every day | ORAL | Status: DC
  Administered 2016-01-08 – 2016-01-11 (×4): 12.5 mg via ORAL
  Filled 2016-01-08 (×5): qty 1

## 2016-01-08 MED ORDER — POTASSIUM CHLORIDE CRYS ER 20 MEQ PO TBCR
30.0000 meq | EXTENDED_RELEASE_TABLET | Freq: Two times a day (BID) | ORAL | Status: AC
  Administered 2016-01-08 – 2016-01-09 (×2): 30 meq via ORAL
  Filled 2016-01-08 (×2): qty 1

## 2016-01-08 MED ORDER — TRAZODONE HCL 50 MG PO TABS: 25.0000 mg | ORAL_TABLET | Freq: Every evening | ORAL | Status: DC | PRN

## 2016-01-08 MED ORDER — FLEET ENEMA 7-19 GM/118ML RE ENEM: 1.0000 | ENEMA | Freq: Once | RECTAL | Status: DC | PRN

## 2016-01-08 MED ORDER — VANCOMYCIN HCL 10 G IV SOLR
1500.0000 mg | Freq: Three times a day (TID) | INTRAVENOUS | Status: DC
  Administered 2016-01-08 – 2016-01-10 (×7): 1500 mg via INTRAVENOUS
  Filled 2016-01-08 (×8): qty 1500

## 2016-01-08 MED ORDER — ONDANSETRON HCL 4 MG PO TABS: 4.0000 mg | ORAL_TABLET | Freq: Four times a day (QID) | ORAL | Status: DC | PRN

## 2016-01-08 MED ORDER — FUROSEMIDE 40 MG PO TABS: 40.0000 mg | ORAL_TABLET | Freq: Every day | ORAL | Status: DC

## 2016-01-08 MED ORDER — POLYETHYLENE GLYCOL 3350 17 G PO PACK: 17.0000 g | PACK | Freq: Every day | ORAL | Status: DC | PRN

## 2016-01-08 MED ORDER — PIPERACILLIN-TAZOBACTAM 3.375 G IVPB
3.3750 g | Freq: Three times a day (TID) | INTRAVENOUS | Status: DC
  Administered 2016-01-08: 3.375 g via INTRAVENOUS
  Filled 2016-01-08 (×3): qty 50

## 2016-01-08 MED ORDER — BISACODYL 5 MG PO TBEC: 5.0000 mg | DELAYED_RELEASE_TABLET | Freq: Every day | ORAL | Status: DC | PRN

## 2016-01-08 MED ORDER — PIPERACILLIN-TAZOBACTAM 3.375 G IVPB
3.3750 g | Freq: Three times a day (TID) | INTRAVENOUS | Status: DC
  Administered 2016-01-08 – 2016-01-10 (×5): 3.375 g via INTRAVENOUS
  Filled 2016-01-08 (×6): qty 50

## 2016-01-08 MED ORDER — VANCOMYCIN HCL 10 G IV SOLR
1500.0000 mg | INTRAVENOUS | Status: AC
  Administered 2016-01-08: 1500 mg via INTRAVENOUS
  Filled 2016-01-08: qty 1500

## 2016-01-08 MED ORDER — ASPIRIN EC 81 MG PO TBEC
81.0000 mg | DELAYED_RELEASE_TABLET | Freq: Every day | ORAL | Status: DC
  Administered 2016-01-08 – 2016-01-11 (×4): 81 mg via ORAL
  Filled 2016-01-08 (×4): qty 1

## 2016-01-08 MED ORDER — ACETAMINOPHEN 650 MG RE SUPP: 650.0000 mg | Freq: Four times a day (QID) | RECTAL | Status: DC | PRN

## 2016-01-08 MED ORDER — HEPARIN SODIUM (PORCINE) 5000 UNIT/ML IJ SOLN
5000.0000 [IU] | Freq: Three times a day (TID) | INTRAMUSCULAR | Status: DC
  Administered 2016-01-08 – 2016-01-11 (×9): 5000 [IU] via SUBCUTANEOUS
  Filled 2016-01-08 (×10): qty 1

## 2016-01-08 MED ORDER — ONDANSETRON HCL 4 MG/2ML IJ SOLN: 4.0000 mg | Freq: Four times a day (QID) | INTRAMUSCULAR | Status: DC | PRN

## 2016-01-08 MED ORDER — OXYCODONE HCL 5 MG PO TABS: 5.0000 mg | ORAL_TABLET | ORAL | Status: DC | PRN

## 2016-01-08 MED ORDER — DOCUSATE SODIUM 100 MG PO CAPS
100.0000 mg | ORAL_CAPSULE | Freq: Two times a day (BID) | ORAL | Status: DC
  Administered 2016-01-08 – 2016-01-11 (×7): 100 mg via ORAL
  Filled 2016-01-08 (×7): qty 1

## 2016-01-08 NOTE — Progress Notes (Signed)
Pharmacy Antibiotic Note  Jordan Jensen is a 46 y.o. male admitted on 01/07/2016 with cellulitis.  Pharmacy has been consulted for Vancocin and Zosyn dosing.  Plan: Rec'd vanc 1g and Zosyn 3.375g in ED. Vancomycin 1500mg  IV every 8 hours.  Goal trough 10-15 mcg/mL. Zosyn 3.375g IV q8h (4 hour infusion).  Height: 5\' 11"  (180.3 cm) Weight: (!) 369 lb 14.9 oz (167.8 kg) IBW/kg (Calculated) : 75.3  Temp (24hrs), Avg:99 F (37.2 C), Min:99 F (37.2 C), Max:99 F (37.2 C)   Recent Labs Lab 01/07/16 1722 01/07/16 2152  WBC 28.2*  --   CREATININE 1.42*  --   LATICACIDVEN  --  1.49    Estimated Creatinine Clearance: 103.2 mL/min (by C-G formula based on SCr of 1.42 mg/dL (H)).    Allergies  Allergen Reactions  . Ace Inhibitors Cough  . Latex Itching and Rash    cellulitis     Thank you for allowing pharmacy to be a part of this patient's care.  Vernard Gambles, PharmD, BCPS  01/08/2016 12:11 AM
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
## 2016-01-08 NOTE — Progress Notes (Signed)
Pt arrived to 2w05 via stretcher. Tele box placed, CCMD notified x2. Pt in no apparent distress, no SOB. VSS. Pt does complain of mild pain at L leg, relived by elevation with pillow and repositioning. Pt's girlfriend at bedside, pt and girlfriend updated on plan of care and oriented to room and call bell. Will continue to monitor.

## 2016-01-08 NOTE — Care Management Note (Signed)
Case Management Note  Patient Details  Name: Jordan Jensen MRN: [id number] Date of Birth: 05/25/1970  Subjective/Objective: Pt presented for cellulitis of the left lower extremity. Initiated on IV  Zosyn. Pt is from home with the support of daughter. Per pt he has DME RW, Shower Chair, WC and Crutches that are at bedside. Pt has PCP with Family Medicine. Per pt he has Used [facility] in the past for [employer].              Action/Plan: CM will continue to monitor for additional disposition needs.   Expected Discharge Date:  01/10/16               Expected Discharge Plan:  Home w Home Health Services  In-House Referral:     Discharge planning Services  CM Consult  Post Acute Care Choice:    Choice offered to:     DME Arranged:    DME Agency:     HH Arranged:    HH Agency:     Status of Service:  In process, will continue to follow  If discussed at Long Length of Stay Meetings, dates discussed:    Additional Comments:  Gala Lewandowsky, RN 01/08/2016, 4:31 PM

## 2016-01-08 NOTE — Progress Notes (Signed)
Patient did not get scheduled piperacillin-tazobactam (Zosyn) 3.375g documented given at 0421am today until 9am as the ABT wasn't flowing from the line, ABT now completed , pharmacy notified to adjust next dose , will continue to monitor

## 2016-01-09 ENCOUNTER — Inpatient Hospital Stay (HOSPITAL_COMMUNITY): Payer: Medicare HMO

## 2016-01-09 DIAGNOSIS — R0600 Dyspnea, unspecified: Secondary | ICD-10-CM

## 2016-01-09 LAB — BASIC METABOLIC PANEL
Anion gap: 6 (ref 5–15)
BUN: 7 mg/dL (ref 6–20)
CHLORIDE: 105 mmol/L (ref 101–111)
CO2: 25 mmol/L (ref 22–32)
Calcium: 8.6 mg/dL — ABNORMAL LOW (ref 8.9–10.3)
Creatinine, Ser: 1.03 mg/dL (ref 0.61–1.24)
GFR calc Af Amer: >60 mL/min (ref >60)
GFR calc non Af Amer: >60 mL/min (ref >60)
GLUCOSE: 113 mg/dL — AB (ref 65–99)
POTASSIUM: 3.9 mmol/L (ref 3.5–5.1)
Sodium: 136 mmol/L (ref 135–145)

## 2016-01-09 LAB — ECHOCARDIOGRAM COMPLETE
Height: 71 in
WEIGHTICAEL: 5856 [oz_av]

## 2016-01-09 LAB — CBC
HEMATOCRIT: 36 % — AB (ref 39.0–52.0)
Hemoglobin: 11.4 g/dL — ABNORMAL LOW (ref 13.0–17.0)
MCH: 29.5 pg (ref 26.0–34.0)
MCHC: 31.7 g/dL (ref 30.0–36.0)
MCV: 93 fL (ref 78.0–100.0)
Platelets: 128 10*3/uL — ABNORMAL LOW (ref 150–400)
RBC: 3.87 MIL/uL — ABNORMAL LOW (ref 4.22–5.81)
RDW: 14 % (ref 11.5–15.5)
WBC: 15.2 10*3/uL — ABNORMAL HIGH (ref 4.0–10.5)

## 2016-01-09 LAB — URINE CULTURE: Culture: NO GROWTH

## 2016-01-09 MED ORDER — PERFLUTREN LIPID MICROSPHERE
1.0000 mL | INTRAVENOUS | Status: AC | PRN
  Administered 2016-01-09: 4 mL via INTRAVENOUS
  Filled 2016-01-09: qty 10

## 2016-01-09 NOTE — Progress Notes (Signed)
ACE wraps applied to bilateral lower extremities - TED hose ordered, none in proper size.   Leonidas Romberg, RN
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
## 2016-01-09 NOTE — Progress Notes (Signed)
  Echocardiogram 2D Echocardiogram with Definity has been performed.  Leta Jungling M 01/09/2016, 10:31 AM

## 2016-01-10 ENCOUNTER — Other Ambulatory Visit: Payer: Self-pay | Admitting: Family Medicine

## 2016-01-10 DIAGNOSIS — I890 Lymphedema, not elsewhere classified: Secondary | ICD-10-CM

## 2016-01-10 LAB — BASIC METABOLIC PANEL
Anion gap: 6 (ref 5–15)
BUN: 7 mg/dL (ref 6–20)
CHLORIDE: 103 mmol/L (ref 101–111)
CO2: 27 mmol/L (ref 22–32)
Calcium: 8.9 mg/dL (ref 8.9–10.3)
Creatinine, Ser: 1.08 mg/dL (ref 0.61–1.24)
GFR calc Af Amer: >60 mL/min (ref >60)
GFR calc non Af Amer: >60 mL/min (ref >60)
Glucose, Bld: 118 mg/dL — ABNORMAL HIGH (ref 65–99)
POTASSIUM: 3.9 mmol/L (ref 3.5–5.1)
Sodium: 136 mmol/L (ref 135–145)

## 2016-01-10 LAB — CBC
HEMATOCRIT: 36.4 % — AB (ref 39.0–52.0)
Hemoglobin: 11.8 g/dL — ABNORMAL LOW (ref 13.0–17.0)
MCH: 29.6 pg (ref 26.0–34.0)
MCHC: 32.4 g/dL (ref 30.0–36.0)
MCV: 91.2 fL (ref 78.0–100.0)
Platelets: 153 10*3/uL (ref 150–400)
RBC: 3.99 MIL/uL — ABNORMAL LOW (ref 4.22–5.81)
RDW: 13.5 % (ref 11.5–15.5)
WBC: 12.4 10*3/uL — ABNORMAL HIGH (ref 4.0–10.5)

## 2016-01-10 MED ORDER — DOXYCYCLINE HYCLATE 100 MG PO TABS
100.0000 mg | ORAL_TABLET | Freq: Two times a day (BID) | ORAL | Status: DC
  Administered 2016-01-10 – 2016-01-11 (×3): 100 mg via ORAL
  Filled 2016-01-10 (×3): qty 1

## 2016-01-10 MED ORDER — FUROSEMIDE 40 MG PO TABS
40.0000 mg | ORAL_TABLET | Freq: Every day | ORAL | Status: DC
  Administered 2016-01-10 – 2016-01-11 (×2): 40 mg via ORAL
  Filled 2016-01-10 (×2): qty 1
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
## 2016-01-10 NOTE — Progress Notes (Signed)
Transitions of Care Pharmacy Note  Plan:  Educated on: - Doxycycline indication, dosing, and side effects - Weights and furosemide   Addressed concerns regarding: - Why doxycycline and not clindamycin - Need for new shower chair (old one is broken)  --------------------------------------------- Jordan Jensen is an 46 y.o. male who presents with a chief complaint of LLE cellulitis. In anticipation of discharge, pharmacy has reviewed this patient's prior to admission medication history, as well as current inpatient medications listed per the [facility].  Current medication indications, dosing, frequency, and notable side effects reviewed with patient and family, who verbalized understanding of current inpatient medication regimen and are aware that the After Visit Summary when presented, will represent the most accurate medication list at discharge.   Keatin L Ouderkirk expressed concerns regarding doxycycline side effects, why he wasn't being sent home on clindamycin, and need for a new shower chair.    Assessment: Understanding of regimen: good Understanding of indications: good Potential of compliance: good Barriers to Obtaining Medications: No  Patient instructed to contact inpatient pharmacy team with further questions or concerns if needed.    Time spent preparing for discharge counseling: 10 mins Time spent counseling patient: 10 mins   Thank you for allowing pharmacy to be a part of this patient's care.  Allie Bossier, PharmD PGY1 Pharmacy Resident [phone] (Pager) 01/10/2016 2:29 PM
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
## 2016-01-11 DIAGNOSIS — I10 Essential (primary) hypertension: Secondary | ICD-10-CM

## 2016-01-11 LAB — BASIC METABOLIC PANEL
Anion gap: 8 (ref 5–15)
BUN: 7 mg/dL (ref 6–20)
CO2: 25 mmol/L (ref 22–32)
CREATININE: 1.01 mg/dL (ref 0.61–1.24)
Calcium: 8.7 mg/dL — ABNORMAL LOW (ref 8.9–10.3)
Chloride: 105 mmol/L (ref 101–111)
GFR calc non Af Amer: >60 mL/min (ref >60)
Glucose, Bld: 98 mg/dL (ref 65–99)
Potassium: 4.1 mmol/L (ref 3.5–5.1)
Sodium: 138 mmol/L (ref 135–145)

## 2016-01-11 LAB — CBC
HCT: 38.3 % — ABNORMAL LOW (ref 39.0–52.0)
Hemoglobin: 12.4 g/dL — ABNORMAL LOW (ref 13.0–17.0)
MCH: 29.3 pg (ref 26.0–34.0)
MCHC: 32.4 g/dL (ref 30.0–36.0)
MCV: 90.5 fL (ref 78.0–100.0)
PLATELETS: 181 10*3/uL (ref 150–400)
RBC: 4.23 MIL/uL (ref 4.22–5.81)
RDW: 13.4 % (ref 11.5–15.5)
WBC: 11 10*3/uL — ABNORMAL HIGH (ref 4.0–10.5)

## 2016-01-11 MED ORDER — ACETAMINOPHEN 500 MG PO TABS
500.0000 mg | ORAL_TABLET | Freq: Four times a day (QID) | ORAL | Status: DC | PRN
Start: 2016-01-11 — End: 2016-01-16

## 2016-01-11 MED ORDER — DOXYCYCLINE HYCLATE 100 MG PO TABS: 100.0000 mg | ORAL_TABLET | Freq: Two times a day (BID) | ORAL | 0 refills | Status: DC

## 2016-01-11 NOTE — Discharge Instructions (Signed)
You were admitted for having a bacterial infection on your left leg. We gave you antibiotics which helped. Blood cultures were collected and did not grow anything. I have scheduled you a hospital follow up at the PCP office on 01/15/2016 at 1:30 PM. Please continue you doxycycline tablets twice per day as instructed until completed on 01/21/2016. Below is some information on your medication. If you develop a fever, legs becomes more painful, red or swollen again, please seek medical attention.  Doxycycline tablets or capsules What is this medicine? DOXYCYCLINE (dox i SYE kleen) is a tetracycline antibiotic. It kills certain bacteria or stops their growth. It is used to treat many kinds of infections, like dental, skin, respiratory, and urinary tract infections. It also treats acne, Lyme disease, malaria, and certain sexually transmitted infections. This medicine may be used for other purposes; ask your health care provider or pharmacist if you have questions. COMMON BRAND NAME(S): Acticlate, Adoxa, Adoxa CK, Adoxa Pak, Adoxa TT, Alodox, Avidoxy, Doxal, Mondoxyne NL, Monodox, Morgidox 1x, Morgidox 1x Kit, Morgidox 2x, Morgidox 2x Kit, NutriDox, Ocudox, TARGADOX, Vibra-Tabs, Vibramycin What should I tell my health care provider before I take this medicine? They need to know if you have any of these conditions: -liver disease -long exposure to sunlight like working outdoors -stomach problems like colitis -an unusual or allergic reaction to doxycycline, tetracycline antibiotics, other medicines, foods, dyes, or preservatives -pregnant or trying to get pregnant -breast-feeding How should I use this medicine? Take this medicine by mouth with a full glass of water. Follow the directions on the prescription label. It is best to take this medicine without food, but if it upsets your stomach take it with food. Take your medicine at regular intervals. Do not take your medicine more often than directed. Take all  of your medicine as directed even if you think you are better. Do not skip doses or stop your medicine early. Talk to your pediatrician regarding the use of this medicine in children. While this drug may be prescribed for selected conditions, precautions do apply. Overdosage: If you think you have taken too much of this medicine contact a poison control center or emergency room at once. NOTE: This medicine is only for you. Do not share this medicine with others. What if I miss a dose? If you miss a dose, take it as soon as you can. If it is almost time for your next dose, take only that dose. Do not take double or extra doses. What may interact with this medicine? -antacids -barbiturates -birth control pills -bismuth subsalicylate -carbamazepine -methoxyflurane -other antibiotics -phenytoin -vitamins that contain iron -warfarin This list may not describe all possible interactions. Give your health care provider a list of all the medicines, herbs, non-prescription drugs, or dietary supplements you use. Also tell them if you smoke, drink alcohol, or use illegal drugs. Some items may interact with your medicine. What should I watch for while using this medicine? Tell your doctor or health care professional if your symptoms do not improve. Do not treat diarrhea with over the counter products. Contact your doctor if you have diarrhea that lasts more than 2 days or if it is severe and watery. Do not take this medicine just before going to bed. It may not dissolve properly when you lay down and can cause pain in your throat. Drink plenty of fluids while taking this medicine to also help reduce irritation in your throat. This medicine can make you more sensitive to the sun. Keep  out of the sun. If you cannot avoid being in the sun, wear protective clothing and use sunscreen. Do not use sun lamps or tanning beds/booths. Birth control pills may not work properly while you are taking this medicine. Talk  to your doctor about using an extra method of birth control. If you are being treated for a sexually transmitted infection, avoid sexual contact until you have finished your treatment. Your sexual partner may also need treatment. Avoid antacids, aluminum, calcium, magnesium, and iron products for 4 hours before and 2 hours after taking a dose of this medicine. If you are using this medicine to prevent malaria, you should still protect yourself from contact with mosquitos. Stay in screened-in areas, use mosquito nets, keep your body covered, and use an insect repellent. What side effects may I notice from receiving this medicine? Side effects that you should report to your doctor or health care professional as soon as possible: -allergic reactions like skin rash, itching or hives, swelling of the face, lips, or tongue -difficulty breathing -fever -itching in the rectal or genital area -pain on swallowing -redness, blistering, peeling or loosening of the skin, including inside the mouth -severe stomach pain or cramps -unusual bleeding or bruising -unusually weak or tired -yellowing of the eyes or skin Side effects that usually do not require medical attention (report to your doctor or health care professional if they continue or are bothersome): -diarrhea -loss of appetite -nausea, vomiting This list may not describe all possible side effects. Call your doctor for medical advice about side effects. You may report side effects to FDA at 1-800-FDA-1088. Where should I keep my medicine? Keep out of the reach of children. Store at room temperature, below 30 degrees C (86 degrees F). Protect from light. Keep container tightly closed. Throw away any unused medicine after the expiration date. Taking this medicine after the expiration date can make you seriously ill. NOTE: This sheet is a summary. It may not cover all possible information. If you have questions about this medicine, talk to your doctor,  pharmacist, or health care provider.  2017 Elsevier/Gold Standard (2015-01-24 17:11:22)

## 2016-01-11 NOTE — Progress Notes (Signed)
Order to discharge received, IV and tele removed.  CCMD notified 
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
## 2016-01-11 NOTE — Care Management Important Message (Signed)
Important Message  Patient Details  Name: Jordan Jensen MRN: [id number] Date of Birth: 1970-12-01   Medicare Important Message Given:  Yes    [name] Abena 01/11/2016, 1:37 PM

## 2016-01-11 NOTE — Care Management Note (Signed)
Case Management Note Previous CM note initiated by Gala Lewandowsky, RN-01/08/2016, 4:31 PM    Patient Details  Name: Jordan Jensen MRN: [id number] Date of Birth: 1970-12-14  Subjective/Objective: Pt presented for cellulitis of the left lower extremity. Initiated on IV  Zosyn. Pt is from home with the support of daughter. Per pt he has DME RW, Shower Chair, WC and Crutches that are at bedside. Pt has PCP with Family Medicine. Per pt he has Used [facility] in the past for [employer].              Action/Plan: CM will continue to monitor for additional disposition needs.   Expected Discharge Date:  01/11/16         Expected Discharge Plan:  Home w Home Health Services  In-House Referral:     Discharge planning Services  CM Consult  Post Acute Care Choice:  Durable Medical Equipment Choice offered to:     DME Arranged:  Shower stool DME Agency:     HH Arranged:    HH Agency:     Status of Service:  Completed, signed off  If discussed at [employer], dates discussed:    Additional Comments:  01/11/16- 1230- Samrat Hayward Webtser RN, CM- pt for d/c home today- order placed for shower stool- per pt he already has all needed DME. -   Zenda Alpers, Lenn Sink, RN 01/11/2016, 3:52 PM [phone]
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
## 2016-01-13 ENCOUNTER — Inpatient Hospital Stay (HOSPITAL_COMMUNITY)
Admission: EM | Admit: 2016-01-13 | Discharge: 2016-01-16 | DRG: 602 | Disposition: A | Discharge status: Home / Self Care | Payer: Medicare HMO | Attending: Family Medicine | Admitting: Family Medicine

## 2016-01-13 ENCOUNTER — Encounter (HOSPITAL_COMMUNITY): Payer: Self-pay

## 2016-01-13 DIAGNOSIS — Z79899 Other long term (current) drug therapy: Secondary | ICD-10-CM

## 2016-01-13 DIAGNOSIS — Z981 Arthrodesis status: Secondary | ICD-10-CM

## 2016-01-13 DIAGNOSIS — Z9889 Other specified postprocedural states: Secondary | ICD-10-CM

## 2016-01-13 DIAGNOSIS — L03116 Cellulitis of left lower limb: Secondary | ICD-10-CM | POA: Diagnosis not present

## 2016-01-13 DIAGNOSIS — L0390 Cellulitis, unspecified: Secondary | ICD-10-CM | POA: Diagnosis not present

## 2016-01-13 DIAGNOSIS — Z993 Dependence on wheelchair: Secondary | ICD-10-CM | POA: Diagnosis not present

## 2016-01-13 DIAGNOSIS — Z96649 Presence of unspecified artificial hip joint: Secondary | ICD-10-CM | POA: Diagnosis present

## 2016-01-13 DIAGNOSIS — Z809 Family history of malignant neoplasm, unspecified: Secondary | ICD-10-CM | POA: Diagnosis not present

## 2016-01-13 DIAGNOSIS — Z87891 Personal history of nicotine dependence: Secondary | ICD-10-CM

## 2016-01-13 DIAGNOSIS — G959 Disease of spinal cord, unspecified: Secondary | ICD-10-CM | POA: Diagnosis not present

## 2016-01-13 DIAGNOSIS — I5022 Chronic systolic (congestive) heart failure: Secondary | ICD-10-CM | POA: Diagnosis present

## 2016-01-13 DIAGNOSIS — Z888 Allergy status to other drugs, medicaments and biological substances status: Secondary | ICD-10-CM

## 2016-01-13 DIAGNOSIS — Z7982 Long term (current) use of aspirin: Secondary | ICD-10-CM | POA: Diagnosis not present

## 2016-01-13 DIAGNOSIS — Z833 Family history of diabetes mellitus: Secondary | ICD-10-CM | POA: Diagnosis not present

## 2016-01-13 DIAGNOSIS — Z91040 Latex allergy status: Secondary | ICD-10-CM

## 2016-01-13 DIAGNOSIS — M7989 Other specified soft tissue disorders: Secondary | ICD-10-CM | POA: Diagnosis not present

## 2016-01-13 DIAGNOSIS — I110 Hypertensive heart disease with heart failure: Secondary | ICD-10-CM | POA: Diagnosis present

## 2016-01-13 DIAGNOSIS — I890 Lymphedema, not elsewhere classified: Secondary | ICD-10-CM | POA: Diagnosis present

## 2016-01-13 DIAGNOSIS — G8220 Paraplegia, unspecified: Secondary | ICD-10-CM

## 2016-01-13 DIAGNOSIS — G8250 Quadriplegia, unspecified: Secondary | ICD-10-CM | POA: Diagnosis not present

## 2016-01-13 DIAGNOSIS — Z6841 Body Mass Index (BMI) 40.0 and over, adult: Secondary | ICD-10-CM | POA: Diagnosis not present

## 2016-01-13 HISTORY — DX: Cellulitis of left lower limb: L03.116

## 2016-01-13 HISTORY — DX: Gastro-esophageal reflux disease without esophagitis: K21.9

## 2016-01-13 HISTORY — DX: Cutaneous abscess of left lower limb: L02.416

## 2016-01-13 LAB — BASIC METABOLIC PANEL
ANION GAP: 13 (ref 5–15)
BUN: 10 mg/dL (ref 6–20)
CALCIUM: 9.5 mg/dL (ref 8.9–10.3)
CO2: 23 mmol/L (ref 22–32)
Chloride: 101 mmol/L (ref 101–111)
Creatinine, Ser: 1.14 mg/dL (ref 0.61–1.24)
GLUCOSE: 118 mg/dL — AB (ref 65–99)
POTASSIUM: 3.9 mmol/L (ref 3.5–5.1)
Sodium: 137 mmol/L (ref 135–145)

## 2016-01-13 LAB — CBC WITH DIFFERENTIAL/PLATELET
BASOS ABS: 0 10*3/uL (ref 0.0–0.1)
Basophils Relative: 0 %
EOS PCT: 2 %
Eosinophils Absolute: 0.3 10*3/uL (ref 0.0–0.7)
HEMATOCRIT: 43 % (ref 39.0–52.0)
Hemoglobin: 14.3 g/dL (ref 13.0–17.0)
Lymphocytes Relative: 20 %
Lymphs Abs: 2.9 10*3/uL (ref 0.7–4.0)
MCH: 30.5 pg (ref 26.0–34.0)
MCHC: 33.3 g/dL (ref 30.0–36.0)
MCV: 91.7 fL (ref 78.0–100.0)
MONO ABS: 0.7 10*3/uL (ref 0.1–1.0)
MONOS PCT: 5 %
NEUTROS PCT: 73 %
Neutro Abs: 10.7 10*3/uL — ABNORMAL HIGH (ref 1.7–7.7)
Platelets: 229 10*3/uL (ref 150–400)
RBC: 4.69 MIL/uL (ref 4.22–5.81)
RDW: 13.7 % (ref 11.5–15.5)
WBC: 14.6 10*3/uL — AB (ref 4.0–10.5)

## 2016-01-13 LAB — CULTURE, BLOOD (ROUTINE X 2)
CULTURE: NO GROWTH
Culture: NO GROWTH
Culture: NO GROWTH
Culture: NO GROWTH

## 2016-01-13 MED ORDER — VANCOMYCIN HCL 10 G IV SOLR
2500.0000 mg | Freq: Once | INTRAVENOUS | Status: AC
  Administered 2016-01-14: 2500 mg via INTRAVENOUS
  Filled 2016-01-13: qty 2500

## 2016-01-13 MED ORDER — SODIUM CHLORIDE 0.9 % IV SOLN
1750.0000 mg | Freq: Two times a day (BID) | INTRAVENOUS | Status: DC
  Administered 2016-01-14 – 2016-01-15 (×3): 1750 mg via INTRAVENOUS
  Filled 2016-01-13 (×3): qty 1750

## 2016-01-13 NOTE — ED Triage Notes (Signed)
Pt states that he was seen here recently here for cellulitis of his L leg. Pt states that he has had this problem before and feels he was discharge to soon. Pt feels his cellulitis is uncontrolled at home. Leg very swollen, red and warm to touch. No fevers at this time.

## 2016-01-13 NOTE — Progress Notes (Addendum)
Pharmacy Antibiotic Note  Jordan Jensen is a 46 y.o. male admitted on 01/13/2016 with cellulitis.  Pharmacy has been consulted for Vancomycin dosing. WBC elevated. Renal function good.   Plan: -Vancomycin 2500 mg IV x 1 load, then give 1750 mg IV q12h -Trend WBC, temp, renal function  -Drug levels as indicated   Height: 5\' 11"  (180.3 cm) Weight: (!) 372 lb (168.7 kg) IBW/kg (Calculated) : 75.3  Temp (24hrs), Avg:98.8 F (37.1 C), Min:98.8 F (37.1 C), Max:98.8 F (37.1 C)   Recent Labs Lab 01/07/16 2152 01/08/16 0247 01/09/16 0944 01/10/16 0138 01/11/16 0408 01/13/16 2006  WBC  --  22.6* 15.2* 12.4* 11.0* 14.6*  CREATININE  --  1.24 1.03 1.08 1.01 1.14  LATICACIDVEN 1.49  --   --   --   --   --     Estimated Creatinine Clearance: 129.1 mL/min (by C-G formula based on SCr of 1.14 mg/dL).    Allergies  Allergen Reactions  . Ace Inhibitors Cough  . Latex Itching and Rash    cellulitis    Jordan Jensen 01/13/2016 11:11 PM
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
## 2016-01-14 ENCOUNTER — Encounter (HOSPITAL_COMMUNITY): Payer: Self-pay | Admitting: General Practice

## 2016-01-14 DIAGNOSIS — M7989 Other specified soft tissue disorders: Secondary | ICD-10-CM | POA: Diagnosis not present

## 2016-01-14 DIAGNOSIS — Z993 Dependence on wheelchair: Secondary | ICD-10-CM | POA: Diagnosis not present

## 2016-01-14 DIAGNOSIS — G959 Disease of spinal cord, unspecified: Secondary | ICD-10-CM | POA: Diagnosis present

## 2016-01-14 DIAGNOSIS — Z7982 Long term (current) use of aspirin: Secondary | ICD-10-CM | POA: Diagnosis not present

## 2016-01-14 DIAGNOSIS — Z981 Arthrodesis status: Secondary | ICD-10-CM | POA: Diagnosis not present

## 2016-01-14 DIAGNOSIS — Z87891 Personal history of nicotine dependence: Secondary | ICD-10-CM | POA: Diagnosis not present

## 2016-01-14 DIAGNOSIS — I890 Lymphedema, not elsewhere classified: Secondary | ICD-10-CM | POA: Diagnosis present

## 2016-01-14 DIAGNOSIS — Z833 Family history of diabetes mellitus: Secondary | ICD-10-CM | POA: Diagnosis not present

## 2016-01-14 DIAGNOSIS — I110 Hypertensive heart disease with heart failure: Secondary | ICD-10-CM | POA: Diagnosis present

## 2016-01-14 DIAGNOSIS — Z809 Family history of malignant neoplasm, unspecified: Secondary | ICD-10-CM | POA: Diagnosis not present

## 2016-01-14 DIAGNOSIS — Z9889 Other specified postprocedural states: Secondary | ICD-10-CM | POA: Diagnosis not present

## 2016-01-14 DIAGNOSIS — L03116 Cellulitis of left lower limb: Secondary | ICD-10-CM | POA: Diagnosis present

## 2016-01-14 DIAGNOSIS — Z96649 Presence of unspecified artificial hip joint: Secondary | ICD-10-CM | POA: Diagnosis present

## 2016-01-14 DIAGNOSIS — L0390 Cellulitis, unspecified: Secondary | ICD-10-CM | POA: Diagnosis not present

## 2016-01-14 DIAGNOSIS — Z79899 Other long term (current) drug therapy: Secondary | ICD-10-CM | POA: Diagnosis not present

## 2016-01-14 DIAGNOSIS — Z888 Allergy status to other drugs, medicaments and biological substances status: Secondary | ICD-10-CM | POA: Diagnosis not present

## 2016-01-14 DIAGNOSIS — I5022 Chronic systolic (congestive) heart failure: Secondary | ICD-10-CM | POA: Diagnosis present

## 2016-01-14 DIAGNOSIS — G8250 Quadriplegia, unspecified: Secondary | ICD-10-CM | POA: Diagnosis present

## 2016-01-14 DIAGNOSIS — Z6841 Body Mass Index (BMI) 40.0 and over, adult: Secondary | ICD-10-CM | POA: Diagnosis not present

## 2016-01-14 DIAGNOSIS — Z91040 Latex allergy status: Secondary | ICD-10-CM | POA: Diagnosis not present

## 2016-01-14 LAB — CBC
HCT: 39 % (ref 39.0–52.0)
HEMOGLOBIN: 12.6 g/dL — AB (ref 13.0–17.0)
MCH: 29.7 pg (ref 26.0–34.0)
MCHC: 32.3 g/dL (ref 30.0–36.0)
MCV: 92 fL (ref 78.0–100.0)
PLATELETS: 198 10*3/uL (ref 150–400)
RBC: 4.24 MIL/uL (ref 4.22–5.81)
RDW: 13.6 % (ref 11.5–15.5)
WBC: 14.8 10*3/uL — AB (ref 4.0–10.5)

## 2016-01-14 LAB — BASIC METABOLIC PANEL
ANION GAP: 9 (ref 5–15)
BUN: 11 mg/dL (ref 6–20)
CHLORIDE: 105 mmol/L (ref 101–111)
CO2: 25 mmol/L (ref 22–32)
Calcium: 8.8 mg/dL — ABNORMAL LOW (ref 8.9–10.3)
Creatinine, Ser: 1.1 mg/dL (ref 0.61–1.24)
GFR calc Af Amer: >60 mL/min (ref >60)
Glucose, Bld: 95 mg/dL (ref 65–99)
POTASSIUM: 3.9 mmol/L (ref 3.5–5.1)
SODIUM: 139 mmol/L (ref 135–145)

## 2016-01-14 MED ORDER — ENOXAPARIN SODIUM 100 MG/ML ~~LOC~~ SOLN
0.5000 mg/kg | SUBCUTANEOUS | Status: DC
  Administered 2016-01-15 – 2016-01-16 (×2): 85 mg via SUBCUTANEOUS
  Filled 2016-01-14 (×2): qty 1

## 2016-01-14 MED ORDER — SODIUM CHLORIDE 0.9% FLUSH
3.0000 mL | Freq: Two times a day (BID) | INTRAVENOUS | Status: DC
  Administered 2016-01-15 – 2016-01-16 (×2): 3 mL via INTRAVENOUS

## 2016-01-14 MED ORDER — SODIUM CHLORIDE 0.9% FLUSH: 3.0000 mL | INTRAVENOUS | Status: DC | PRN

## 2016-01-14 MED ORDER — ASPIRIN EC 81 MG PO TBEC
81.0000 mg | DELAYED_RELEASE_TABLET | Freq: Every day | ORAL | Status: DC
  Administered 2016-01-14 – 2016-01-16 (×3): 81 mg via ORAL
  Filled 2016-01-14 (×3): qty 1

## 2016-01-14 MED ORDER — ADULT MULTIVITAMIN W/MINERALS CH
1.0000 | ORAL_TABLET | Freq: Every day | ORAL | Status: DC
  Administered 2016-01-14 – 2016-01-16 (×3): 1 via ORAL
  Filled 2016-01-14 (×3): qty 1

## 2016-01-14 MED ORDER — ACETAMINOPHEN 325 MG PO TABS: 650.0000 mg | ORAL_TABLET | Freq: Four times a day (QID) | ORAL | Status: DC | PRN

## 2016-01-14 MED ORDER — PIPERACILLIN-TAZOBACTAM 3.375 G IVPB
3.3750 g | Freq: Three times a day (TID) | INTRAVENOUS | Status: DC
  Administered 2016-01-14: 3.375 g via INTRAVENOUS
  Filled 2016-01-14 (×2): qty 50

## 2016-01-14 MED ORDER — PIPERACILLIN-TAZOBACTAM 3.375 G IVPB 30 MIN
3.3750 g | Freq: Once | INTRAVENOUS | Status: DC
  Filled 2016-01-14: qty 50

## 2016-01-14 MED ORDER — SODIUM CHLORIDE 0.9 % IV SOLN: 250.0000 mL | INTRAVENOUS | Status: DC | PRN

## 2016-01-14 MED ORDER — ENOXAPARIN SODIUM 40 MG/0.4ML ~~LOC~~ SOLN
40.0000 mg | SUBCUTANEOUS | Status: DC
  Administered 2016-01-14: 40 mg via SUBCUTANEOUS
  Filled 2016-01-14: qty 0.4

## 2016-01-14 MED ORDER — METOPROLOL SUCCINATE ER 25 MG PO TB24
12.5000 mg | ORAL_TABLET | Freq: Every day | ORAL | Status: DC
  Administered 2016-01-14 – 2016-01-16 (×3): 12.5 mg via ORAL
  Filled 2016-01-14 (×3): qty 1

## 2016-01-14 MED ORDER — ACETAMINOPHEN 650 MG RE SUPP: 650.0000 mg | Freq: Four times a day (QID) | RECTAL | Status: DC | PRN

## 2016-01-14 MED ORDER — FUROSEMIDE 40 MG PO TABS
40.0000 mg | ORAL_TABLET | Freq: Every day | ORAL | Status: DC
  Administered 2016-01-14 – 2016-01-16 (×3): 40 mg via ORAL
  Filled 2016-01-14 (×3): qty 1

## 2016-01-14 NOTE — Progress Notes (Signed)
FPTS Interim Progress Note  S: Patient comfortable stating his LLE began to swelling and get red evening of his d/c a few days ago. Says it feels tense and warm. denies SOB, CP, fevers, chills, n/v.  O: BP 125/72 (BP Location: Left Wrist)   Pulse 82   Temp 97.7 F (36.5 C) (Oral)   Resp 19   Ht 5\' 11"  (1.803 m)   Wt (!) 375 lb (170.1 kg)   SpO2 98%   BMI 52.30 kg/m   General: morbidly obese well nourished, well developed, in no acute distress with non-toxic appearance HEENT: normocephalic, atraumatic, moist mucous membranes CV: regular rate and rhythm without murmurs, rubs, or gallops Lungs: clear to auscultation bilaterally with normal work of breathing Abdomen: soft, non-tender, normoactive bowel sounds Skin: warm, dry, no rashes or lesions, cap refill < 2 seconds Extremities: warm and well perfused, normal tone, LE with significant lymphedema b/l (left>right), mildly warmer on left with no weeping or discharge  A/P: Will restart vancomycin and d/c zosyn. Keep LLE elevated with TED hose and monitor for fevers or leukocytosis worsening. Await BCx. Will obtain LLE doppler to r/o DVT or vascular obstructions.  Wendee Beavers, DO 01/14/2016, 2:00 PM PGY-1, [facility] Family Medicine Service pager [phone]

## 2016-01-14 NOTE — Progress Notes (Signed)
Pharmacy Antibiotic Note  Jordan Jensen is a 46 y.o. male admitted on 01/13/2016 with cellulitis.  Pharmacy has been consulted to add Zosyn to vanc therapy.  Plan: Zosyn 3.375g IV q8h (4 hour infusion).  Height: 5\' 11"  (180.3 cm) Weight: (!) 372 lb (168.7 kg) IBW/kg (Calculated) : 75.3  Temp (24hrs), Avg:98.7 F (37.1 C), Min:98.5 F (36.9 C), Max:98.8 F (37.1 C)   Recent Labs Lab 01/07/16 2152 01/08/16 0247 01/09/16 0944 01/10/16 0138 01/11/16 0408 01/13/16 2006  WBC  --  22.6* 15.2* 12.4* 11.0* 14.6*  CREATININE  --  1.24 1.03 1.08 1.01 1.14  LATICACIDVEN 1.49  --   --   --   --   --     Estimated Creatinine Clearance: 129.1 mL/min (by C-G formula based on SCr of 1.14 mg/dL).    Allergies  Allergen Reactions  . Ace Inhibitors Cough  . Latex Itching and Rash    cellulitis     Thank you for allowing pharmacy to be a part of this patient's care.  Vernard Gambles, PharmD, BCPS  01/14/2016 1:55 AM
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
## 2016-01-15 ENCOUNTER — Inpatient Hospital Stay: Payer: Commercial Managed Care - HMO | Admitting: Family Medicine

## 2016-01-15 ENCOUNTER — Inpatient Hospital Stay (HOSPITAL_COMMUNITY): Payer: Medicare HMO

## 2016-01-15 DIAGNOSIS — M7989 Other specified soft tissue disorders: Secondary | ICD-10-CM

## 2016-01-15 DIAGNOSIS — L0390 Cellulitis, unspecified: Secondary | ICD-10-CM

## 2016-01-15 LAB — CBC
HCT: 39.5 % (ref 39.0–52.0)
HEMOGLOBIN: 12.5 g/dL — AB (ref 13.0–17.0)
MCH: 29.6 pg (ref 26.0–34.0)
MCHC: 31.6 g/dL (ref 30.0–36.0)
MCV: 93.4 fL (ref 78.0–100.0)
Platelets: 236 10*3/uL (ref 150–400)
RBC: 4.23 MIL/uL (ref 4.22–5.81)
RDW: 13.5 % (ref 11.5–15.5)
WBC: 12.2 10*3/uL — ABNORMAL HIGH (ref 4.0–10.5)

## 2016-01-15 LAB — BASIC METABOLIC PANEL
Anion gap: 7 (ref 5–15)
BUN: 10 mg/dL (ref 6–20)
CHLORIDE: 104 mmol/L (ref 101–111)
CO2: 27 mmol/L (ref 22–32)
Calcium: 8.8 mg/dL — ABNORMAL LOW (ref 8.9–10.3)
Creatinine, Ser: 1.07 mg/dL (ref 0.61–1.24)
GFR calc Af Amer: >60 mL/min (ref >60)
GFR calc non Af Amer: >60 mL/min (ref >60)
GLUCOSE: 106 mg/dL — AB (ref 65–99)
Potassium: 3.8 mmol/L (ref 3.5–5.1)
SODIUM: 138 mmol/L (ref 135–145)

## 2016-01-15 MED ORDER — CEPHALEXIN 500 MG PO CAPS: 500.0000 mg | ORAL_CAPSULE | Freq: Four times a day (QID) | ORAL | Status: DC

## 2016-01-15 MED ORDER — CEPHALEXIN 500 MG PO CAPS
500.0000 mg | ORAL_CAPSULE | Freq: Four times a day (QID) | ORAL | Status: DC
  Administered 2016-01-15 – 2016-01-16 (×4): 500 mg via ORAL
  Filled 2016-01-15 (×4): qty 1

## 2016-01-15 NOTE — Progress Notes (Signed)
*  PRELIMINARY RESULTS* Vascular Ultrasound Left lower extremity venous duplex has been completed.  Preliminary findings: No evidence of deep vein thrombosis in the visualized veins of the lower extremity.   Chauncey Fischer 01/15/2016, 11:23 AM
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
## 2016-01-15 NOTE — Progress Notes (Addendum)
Transitions of Care Pharmacy Note  Plan:  Educated on dosage and side effects of cephalexin. Talked to him about how cephalexin is different from doxycycline and why the change was made. Also discussed the need to elevate his legs and continue to wear his compression stockings . --------------------------------------------- Jordan Jensen is an 46 y.o. male who presents with a chief complaint of worsening left lower extremity cellulitis. In anticipation of discharge, pharmacy has reviewed this patient's prior to admission medication history, as well as current inpatient medications listed per the [facility].  Current medication indications, dosing, frequency, and notable side effects reviewed with patient. patient verbalized understanding of current inpatient medication regimen and is aware that the After Visit Summary when presented, will represent the most accurate medication list at discharge.   Jordan Jensen expressed concerns regarding his worsening lower leg cellulitis. I explained the change that was made to his oral antibiotic and the different coverage that Keflex would have from Doxycycline and why the change was made.    Assessment: Understanding of regimen: good Understanding of indications: good Potential of compliance: good Barriers to Obtaining Medications: No  Patient instructed to contact inpatient pharmacy team with further questions or concerns if needed.    Time spent preparing for discharge counseling: 10 minutes Time spent counseling patient: 15 minutes    Thank you for allowing pharmacy to be a part of this patient's care.  Hillis Range, PharmD PGY1 Pharmacy Resident Pager: [phone]

## 2016-01-16 DIAGNOSIS — I890 Lymphedema, not elsewhere classified: Secondary | ICD-10-CM

## 2016-01-16 LAB — CBC
HCT: 39.5 % (ref 39.0–52.0)
Hemoglobin: 12.8 g/dL — ABNORMAL LOW (ref 13.0–17.0)
MCH: 29.8 pg (ref 26.0–34.0)
MCHC: 32.4 g/dL (ref 30.0–36.0)
MCV: 91.9 fL (ref 78.0–100.0)
Platelets: 265 10*3/uL (ref 150–400)
RBC: 4.3 MIL/uL (ref 4.22–5.81)
RDW: 13.3 % (ref 11.5–15.5)
WBC: 12.2 10*3/uL — ABNORMAL HIGH (ref 4.0–10.5)

## 2016-01-16 MED ORDER — CEPHALEXIN 500 MG PO CAPS: 500.0000 mg | ORAL_CAPSULE | Freq: Four times a day (QID) | ORAL | 0 refills | Status: DC

## 2016-01-16 NOTE — Discharge Instructions (Signed)
Please take your antibiotic Keflex 4 times a day for the next 12 days. Course could be shortened after assessment of leg during hospital follow up in clinic.

## 2016-01-16 NOTE — Progress Notes (Signed)
Patient's RN Romeo Apple stated okay for patient to discharge. Discharge paperwork reviewed and given to patient. RX handouts given. Patient waiting for ride and stated they will be here in one hour.
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
## 2016-01-18 ENCOUNTER — Inpatient Hospital Stay: Payer: Medicare HMO | Admitting: Family Medicine

## 2016-01-18 NOTE — Assessment & Plan Note (Deleted)
  Has been taking 40mg  Lasix daily, edema today **  -continue lasix -compression stockings? PT

## 2016-01-18 NOTE — Assessment & Plan Note (Deleted)
  Doing well today **   -Continue Keflex course, end date tentatively 1/23 -follow up 1 week

## 2016-01-18 NOTE — Progress Notes (Deleted)
    Subjective:    Patient ID: Jordan Jensen, male    DOB: 1970/05/14, 46 y.o.   MRN: [id number]   CC: here for hospital follow up - LE cellulitis   HPI:  Shower chair PT follow up?   Smoking status reviewed  Review of Systems   Objective:  There were no vitals taken for this visit. Vitals and nursing note reviewed  General: well nourished, in no acute distress HEENT: normocephalic, TM's visualized bilaterally, no scleral icterus or conjunctival pallor, no nasal discharge, moist mucous membranes, good dentition without erythema or discharge noted in posterior oropharynx Neck: supple, non-tender, without lymphadenopathy Cardiac: RRR, clear S1 and S2, no murmurs, rubs, or gallops Respiratory: clear to auscultation bilaterally, no increased work of breathing Abdomen: soft, nontender, nondistended, no masses or organomegaly. Bowel sounds present Extremities: no edema or cyanosis. Warm, well perfused. 2+ radial and PT pulses bilaterally Skin: warm and dry, no rashes noted Neuro: alert and oriented, no focal deficits   Assessment & Plan:    Cellulitis of left lower extremity  Doing well today **   -Continue Keflex course, end date tentatively 1/23 -follow up 1 week   Bilateral leg edema  Has been taking 40mg  Lasix daily, edema today **  -continue lasix -compression stockings? PT    No Follow-up on file.   Dolores Patty, DO Family Medicine Resident PGY-1

## 2016-01-19 LAB — CULTURE, BLOOD (ROUTINE X 2)
CULTURE: NO GROWTH
Culture: NO GROWTH

## 2016-01-23 ENCOUNTER — Inpatient Hospital Stay: Payer: Medicare HMO | Admitting: Family Medicine

## 2016-01-24 ENCOUNTER — Ambulatory Visit (HOSPITAL_COMMUNITY): Payer: Medicare HMO | Admitting: Physical Therapy

## 2016-02-05 ENCOUNTER — Telehealth (HOSPITAL_COMMUNITY): Payer: Self-pay | Admitting: Family Medicine

## 2016-02-05 ENCOUNTER — Ambulatory Visit (HOSPITAL_COMMUNITY): Payer: Medicare HMO | Admitting: Physical Therapy

## 2016-02-05 NOTE — Telephone Encounter (Signed)
02/05/16 pt cx because he didn't have transportation and hoping to get a rental car today.  We rescheduled for 2/5 because he said he should have a car by then.

## 2016-02-11 ENCOUNTER — Ambulatory Visit (HOSPITAL_COMMUNITY): Payer: Medicare HMO | Attending: Family Medicine | Admitting: Physical Therapy

## 2016-02-11 DIAGNOSIS — I890 Lymphedema, not elsewhere classified: Secondary | ICD-10-CM | POA: Diagnosis not present

## 2016-02-11 DIAGNOSIS — R262 Difficulty in walking, not elsewhere classified: Secondary | ICD-10-CM
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
## 2016-02-13 ENCOUNTER — Ambulatory Visit (HOSPITAL_COMMUNITY): Payer: Medicare HMO | Admitting: Physical Therapy

## 2016-02-13 ENCOUNTER — Encounter: Payer: Self-pay | Admitting: Student

## 2016-02-13 ENCOUNTER — Telehealth: Payer: Self-pay | Admitting: Family Medicine

## 2016-02-13 ENCOUNTER — Ambulatory Visit (INDEPENDENT_AMBULATORY_CARE_PROVIDER_SITE_OTHER): Payer: Medicare HMO | Admitting: Student

## 2016-02-13 ENCOUNTER — Telehealth (HOSPITAL_COMMUNITY): Payer: Self-pay | Admitting: Family Medicine

## 2016-02-13 VITALS — BP 122/78 | HR 93 | Temp 98.4°F

## 2016-02-13 DIAGNOSIS — I890 Lymphedema, not elsewhere classified: Secondary | ICD-10-CM

## 2016-02-13 DIAGNOSIS — L03116 Cellulitis of left lower limb: Secondary | ICD-10-CM | POA: Diagnosis not present

## 2016-02-13 DIAGNOSIS — R531 Weakness: Secondary | ICD-10-CM

## 2016-02-13 DIAGNOSIS — I5022 Chronic systolic (congestive) heart failure: Secondary | ICD-10-CM

## 2016-02-13 NOTE — Patient Instructions (Signed)
Follow up with PCP for compression stocking need If you have any questions or concerns, call the office at [phone]

## 2016-02-13 NOTE — Telephone Encounter (Signed)
Pt needs compression garments ordered for both legs. Knee highs and pressure 30/40. ep

## 2016-02-13 NOTE — Assessment & Plan Note (Signed)
DME Shower stool ordered - follow to ensure he gets it

## 2016-02-13 NOTE — Telephone Encounter (Signed)
Will forward to MD to write these orders. Jazmin Hartsell,CMA

## 2016-02-13 NOTE — Progress Notes (Signed)
   Subjective:    Patient ID: Jordan Jensen, male    DOB: Oct 02, 1970, 46 y.o.   MRN: [id number]   CC: hospital follow up for left leg cellulitis  HPI: 46 y/o M with h/o bilateral LE lymphedema presents for hospital follow up for left leg cellulitis  Cellulitis - reports leg cellulitis has resolved and he completed the antibiotic course - he denies fever or leg pain - he does ask for shower stool which was discussed while he was inpateint - reports difficulty showering himself secondary to limited mobility and some weakness - he has been doing physical therapy  Smoking status reviewed  Review of Systems  Per HPI, else denies chest pain, shortness of breath, abdominal pain, N/V/D    Objective:  BP 122/78   Pulse 93   Temp 98.4 F (36.9 C) (Oral)   SpO2 96%  Vitals and nursing note reviewed  General: NAD Cardiac: RRR, Respiratory: CTAB, normal effort Extremities: bilateral LE edema, no left leg erythema or tenderness to palpation Skin: warm and dry, no rashes noted Neuro: alert and oriented   Assessment & Plan:    Cellulitis of left leg Now resolved  Lymphedema DME Shower stool ordered - follow to ensure he gets it  Weakness - Chronic and secondary to lymphedema - shower stool ordered   Leverne Tessler A. Kennon Rounds MD, MS Family Medicine Resident PGY-3 Pager [phone]

## 2016-02-13 NOTE — Telephone Encounter (Signed)
02/13/16  Pt cx - he thinks he has the flu

## 2016-02-13 NOTE — Assessment & Plan Note (Signed)
Now resolved.  

## 2016-02-14 ENCOUNTER — Ambulatory Visit (HOSPITAL_COMMUNITY): Payer: Medicare HMO | Admitting: Physical Therapy

## 2016-02-14 MED ORDER — MEDICAL COMPRESSION STOCKINGS MISC: 0 refills | Status: DC

## 2016-02-15 ENCOUNTER — Encounter: Payer: Self-pay | Admitting: Family Medicine

## 2016-02-15 MED ORDER — MEDICAL COMPRESSION STOCKINGS MISC: 0 refills | Status: DC

## 2016-02-15 NOTE — Telephone Encounter (Signed)
Printed prescription for compression stockings and left at front desk. Called patient and informed he can pick it up at the front desk. He informed me that PT is also working on getting him a pair through the mail.

## 2016-02-20 ENCOUNTER — Telehealth (HOSPITAL_COMMUNITY): Payer: Self-pay | Admitting: Physical Therapy

## 2016-02-20 ENCOUNTER — Ambulatory Visit (HOSPITAL_COMMUNITY): Payer: Medicare HMO | Admitting: Physical Therapy

## 2016-02-20 NOTE — Telephone Encounter (Signed)
Pt did not show for appointment (NS #1).  Called patient and left message on machine requesting to return call regarding missed appointment and reminder of next appointment tomorrow morning.  Lurena Nida, PTA/CLT [phone]

## 2016-02-21 ENCOUNTER — Ambulatory Visit (HOSPITAL_COMMUNITY): Payer: Medicare HMO | Admitting: Physical Therapy

## 2016-02-21 ENCOUNTER — Telehealth (HOSPITAL_COMMUNITY): Payer: Self-pay | Admitting: Physical Therapy

## 2016-02-21 NOTE — Telephone Encounter (Signed)
Pt did not show for appointment (NS #2).  Called and left voice mail that per our policy, all appointments have been removed except his next one on Monday at 2:30.  Explained he would have to make appointments 1 at a time and if he failed to appear at next appointment he would be discharged from therapy.  Requested patient return call asap Lurena Nida, PTA/CLT [phone]

## 2016-02-22 ENCOUNTER — Telehealth (HOSPITAL_COMMUNITY): Payer: Self-pay | Admitting: Family Medicine

## 2016-02-22 NOTE — Telephone Encounter (Signed)
02/22/16  Pt called and said that he missed the 2 appts because he didn't have a phone to call us on.  He said that he now had a very bad cold but would be here on Monday, 2/19

## 2016-02-25 ENCOUNTER — Telehealth (HOSPITAL_COMMUNITY): Payer: Self-pay | Admitting: Physical Therapy

## 2016-02-25 ENCOUNTER — Ambulatory Visit (HOSPITAL_COMMUNITY): Payer: Medicare HMO | Admitting: Physical Therapy

## 2016-02-25 NOTE — Telephone Encounter (Signed)
Pt showed 30 minutes late for appointment.  Pt came without bandages as well.  Explained to patient in order for treatment to work appropriately, especially for him with little active mm pump, he would need the bandages.  Pt was only able to locate 3-4 bandages over the past 3 weeks.  Pt given ordering sheet with amount of bandages to order.  Instructed patient to order these today and we would begin when he received them.  Instructed to call and cancel next appointment if he did not have his bandages.  Lurena Nida, PTA/CLT [phone]

## 2016-02-27 ENCOUNTER — Ambulatory Visit (HOSPITAL_COMMUNITY): Payer: Medicare HMO | Admitting: Physical Therapy

## 2016-02-28 ENCOUNTER — Ambulatory Visit (HOSPITAL_COMMUNITY): Payer: Medicare HMO | Admitting: Physical Therapy

## 2016-02-28 ENCOUNTER — Telehealth (HOSPITAL_COMMUNITY): Payer: Self-pay | Admitting: Physical Therapy

## 2016-02-28 NOTE — Telephone Encounter (Signed)
Pt did not show for appointment.  Called and left message to return call regarding next appointment and to check on delivery of compression bandages. Lurena Nida, PTA/CLT [phone]

## 2016-03-03 ENCOUNTER — Telehealth (HOSPITAL_COMMUNITY): Payer: Self-pay | Admitting: Physical Therapy

## 2016-03-03 ENCOUNTER — Ambulatory Visit (HOSPITAL_COMMUNITY): Payer: Medicare HMO | Admitting: Physical Therapy

## 2016-03-03 NOTE — Telephone Encounter (Signed)
Pt did not show for appt.  Called and left message to call clinic with intentions on continuing therapy.  Reminded of next appointment on Wednesday at 2:30. Lurena Nida, PTA/CLT [phone]

## 2016-03-05 ENCOUNTER — Ambulatory Visit (HOSPITAL_COMMUNITY): Payer: Medicare HMO | Admitting: Physical Therapy

## 2016-03-05 ENCOUNTER — Telehealth (HOSPITAL_COMMUNITY): Payer: Self-pay | Admitting: Physical Therapy

## 2016-03-05 NOTE — Telephone Encounter (Signed)
PT did not show, 3rd consecutive.   Called and spoke with pateint who apologized for not getting in touch with therapy.  States his phone has been messed up with a cracked screen and worked sometimes and other times not.  Pt also reports he has went back to school and his schedule conflicts with his therapy times.  Pt reports he will resume therapy after his school ends in march.  States he has ordered his compression bandages and has them ready for when he returns.  Lurena Nida, PTA/CLT [phone]

## 2016-03-06 ENCOUNTER — Ambulatory Visit (HOSPITAL_COMMUNITY): Payer: Medicare HMO | Admitting: Physical Therapy

## 2016-03-12 DIAGNOSIS — H524 Presbyopia: Secondary | ICD-10-CM | POA: Diagnosis not present

## 2016-03-12 DIAGNOSIS — H5213 Myopia, bilateral: Secondary | ICD-10-CM | POA: Diagnosis not present

## 2016-03-12 DIAGNOSIS — H52209 Unspecified astigmatism, unspecified eye: Secondary | ICD-10-CM | POA: Diagnosis not present

## 2016-04-03 ENCOUNTER — Telehealth: Payer: Self-pay | Admitting: Family Medicine

## 2016-04-03 NOTE — Telephone Encounter (Signed)
Will forward to MD. Blondell Laperle,CMA  

## 2016-04-03 NOTE — Telephone Encounter (Signed)
Pt needs a letter stating he is disabled for his [employer]. Please call pt when it is ready. ep

## 2016-04-07 ENCOUNTER — Encounter: Payer: Self-pay | Admitting: Family Medicine

## 2016-04-07 NOTE — Telephone Encounter (Signed)
Letter created and available to be sent to patient.

## 2016-04-17 ENCOUNTER — Telehealth: Payer: Self-pay | Admitting: Family Medicine

## 2016-04-17 DIAGNOSIS — Z87828 Personal history of other (healed) physical injury and trauma: Secondary | ICD-10-CM

## 2016-04-17 NOTE — Telephone Encounter (Signed)
Will forward to MD to write script for DME crutches and we can fax to the pharmacy of patient's choice. Shakevia Sarris,CMA

## 2016-04-17 NOTE — Telephone Encounter (Signed)
Patient requesting a RX for new crutches to be sent to the [facility] in [place].

## 2016-04-21 NOTE — Telephone Encounter (Signed)
2nd request. Pt states he needs the crutches ASAP. ep

## 2016-04-22 MED ORDER — CRUTCHES-ALUMINUM MISC: 0 refills | Status: DC

## 2016-04-22 NOTE — Telephone Encounter (Signed)
Rx sent. Provided phone number for [facility] since patient states he did not receive shower chair after last hospital admission in January.

## 2016-04-23 DIAGNOSIS — Z79899 Other long term (current) drug therapy: Secondary | ICD-10-CM | POA: Diagnosis not present

## 2016-04-23 DIAGNOSIS — Z87440 Personal history of urinary (tract) infections: Secondary | ICD-10-CM | POA: Diagnosis not present

## 2016-04-23 DIAGNOSIS — R319 Hematuria, unspecified: Secondary | ICD-10-CM | POA: Diagnosis not present

## 2016-04-23 DIAGNOSIS — N390 Urinary tract infection, site not specified: Secondary | ICD-10-CM | POA: Diagnosis not present

## 2016-04-23 DIAGNOSIS — Z87442 Personal history of urinary calculi: Secondary | ICD-10-CM | POA: Diagnosis not present

## 2016-04-23 DIAGNOSIS — I10 Essential (primary) hypertension: Secondary | ICD-10-CM | POA: Diagnosis not present

## 2016-05-14 ENCOUNTER — Encounter (HOSPITAL_COMMUNITY): Payer: Self-pay | Admitting: Emergency Medicine

## 2016-05-14 ENCOUNTER — Emergency Department (HOSPITAL_COMMUNITY)
Admission: EM | Admit: 2016-05-14 | Discharge: 2016-05-15 | Disposition: A | Discharge status: Home / Self Care | Payer: Medicare HMO | Attending: Emergency Medicine | Admitting: Emergency Medicine

## 2016-05-14 DIAGNOSIS — Z7982 Long term (current) use of aspirin: Secondary | ICD-10-CM | POA: Diagnosis not present

## 2016-05-14 DIAGNOSIS — Z87891 Personal history of nicotine dependence: Secondary | ICD-10-CM | POA: Insufficient documentation

## 2016-05-14 DIAGNOSIS — N390 Urinary tract infection, site not specified: Secondary | ICD-10-CM | POA: Insufficient documentation

## 2016-05-14 DIAGNOSIS — Z79899 Other long term (current) drug therapy: Secondary | ICD-10-CM | POA: Diagnosis not present

## 2016-05-14 DIAGNOSIS — I110 Hypertensive heart disease with heart failure: Secondary | ICD-10-CM | POA: Insufficient documentation

## 2016-05-14 DIAGNOSIS — I5022 Chronic systolic (congestive) heart failure: Secondary | ICD-10-CM | POA: Insufficient documentation

## 2016-05-14 DIAGNOSIS — M545 Low back pain: Secondary | ICD-10-CM | POA: Diagnosis not present

## 2016-05-14 LAB — URINALYSIS, ROUTINE W REFLEX MICROSCOPIC
BILIRUBIN URINE: NEGATIVE
Glucose, UA: NEGATIVE mg/dL
Ketones, ur: NEGATIVE mg/dL
NITRITE: NEGATIVE
PH: 5 (ref 5.0–8.0)
Protein, ur: 100 mg/dL — AB
SPECIFIC GRAVITY, URINE: 1.031 — AB (ref 1.005–1.030)

## 2016-05-14 MED ORDER — DEXTROSE 5 % IV SOLN
1.0000 g | Freq: Once | INTRAVENOUS | Status: DC
  Filled 2016-05-14: qty 10

## 2016-05-14 NOTE — ED Triage Notes (Signed)
Pt c/o lower back pain with very dark urine. Pt states he had uti 3 weeks ago and was treated for the same.
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
## 2016-05-15 DIAGNOSIS — N390 Urinary tract infection, site not specified: Secondary | ICD-10-CM | POA: Diagnosis not present

## 2016-05-15 LAB — CBC WITH DIFFERENTIAL/PLATELET
Basophils Absolute: 0 10*3/uL (ref 0.0–0.1)
Basophils Relative: 0 %
EOS ABS: 0.2 10*3/uL (ref 0.0–0.7)
EOS PCT: 1 %
HCT: 40.5 % (ref 39.0–52.0)
Hemoglobin: 13.4 g/dL (ref 13.0–17.0)
LYMPHS ABS: 3.3 10*3/uL (ref 0.7–4.0)
LYMPHS PCT: 20 %
MCH: 30.3 pg (ref 26.0–34.0)
MCHC: 33.1 g/dL (ref 30.0–36.0)
MCV: 91.6 fL (ref 78.0–100.0)
MONOS PCT: 6 %
Monocytes Absolute: 1 10*3/uL (ref 0.1–1.0)
Neutro Abs: 11.9 10*3/uL — ABNORMAL HIGH (ref 1.7–7.7)
Neutrophils Relative %: 73 %
Platelets: 149 10*3/uL — ABNORMAL LOW (ref 150–400)
RBC: 4.42 MIL/uL (ref 4.22–5.81)
RDW: 13.2 % (ref 11.5–15.5)
WBC: 16.4 10*3/uL — AB (ref 4.0–10.5)

## 2016-05-15 LAB — BASIC METABOLIC PANEL
Anion gap: 7 (ref 5–15)
BUN: 8 mg/dL (ref 6–20)
CHLORIDE: 103 mmol/L (ref 101–111)
CO2: 26 mmol/L (ref 22–32)
Calcium: 8.9 mg/dL (ref 8.9–10.3)
Creatinine, Ser: 1.06 mg/dL (ref 0.61–1.24)
GFR calc Af Amer: >60 mL/min (ref >60)
GFR calc non Af Amer: >60 mL/min (ref >60)
Glucose, Bld: 95 mg/dL (ref 65–99)
POTASSIUM: 3.9 mmol/L (ref 3.5–5.1)
SODIUM: 136 mmol/L (ref 135–145)

## 2016-05-15 MED ORDER — CEFTRIAXONE SODIUM 1 G IJ SOLR
1.0000 g | Freq: Once | INTRAMUSCULAR | Status: AC
  Administered 2016-05-15: 1 g via INTRAMUSCULAR
  Filled 2016-05-15: qty 10

## 2016-05-15 MED ORDER — CEPHALEXIN 500 MG PO CAPS: 500.0000 mg | ORAL_CAPSULE | Freq: Two times a day (BID) | ORAL | 0 refills | Status: DC

## 2016-05-15 MED ORDER — CEFTRIAXONE SODIUM 1 G IJ SOLR
INTRAMUSCULAR | Status: AC
  Filled 2016-05-15: qty 10

## 2016-05-15 NOTE — ED Notes (Signed)
Pt wheeled to waiting room. Pt verbalized understanding of discharge instructions.   

## 2016-05-17 LAB — URINE CULTURE

## 2016-06-19 ENCOUNTER — Ambulatory Visit (INDEPENDENT_AMBULATORY_CARE_PROVIDER_SITE_OTHER): Payer: Medicare HMO | Admitting: Student

## 2016-06-19 ENCOUNTER — Encounter: Payer: Self-pay | Admitting: Student

## 2016-06-19 DIAGNOSIS — I10 Essential (primary) hypertension: Secondary | ICD-10-CM

## 2016-06-19 NOTE — Patient Instructions (Signed)
Follow up with PCP in next 1-2 months  If you have questions or concerns call the office

## 2016-06-19 NOTE — Assessment & Plan Note (Signed)
Blood pressure normal today with no red flags on exam or history - will continue current regimen - follow with PCP for hypertension

## 2016-06-19 NOTE — Progress Notes (Signed)
   Subjective:    Patient ID: Jordan Jensen, male    DOB: [birth year], 46 y.o.   MRN: [id number]   CC: Blood pressure issues  HPI: 46 y/o M presents for blood pressure issues  Blood pressure - he had two episodes of lightheadedness last week - each lasting a short time and spontaneously resolved - no headache or chest pain or SOB - in oast when his blood pressures have been elevated he has felt lightheaded so he come to check his blood pressure - he did not check it as an outpatient - he has recently restarted his metoprolol a few weeks ago, after not taking it for several weeks  Smoking status reviewed   Review of Systems  Per HPI, else denies recent illness, fever  Objective:  BP 130/76   Pulse 87   Temp 99.3 F (37.4 C) (Oral)   Ht 5\' 11"  (1.803 m)   Wt (!) 381 lb 12.8 oz (173.2 kg)   SpO2 96%   BMI 53.25 kg/m  Vitals and nursing note reviewed  General: NAD Cardiac: RRR Respiratory: CTAB, normal effort Extremities: venous stasis of left leg, else no edema or cyanosis. WWP. Skin: warm and dry, no rashes noted Neuro: alert and oriented, no focal deficits   Assessment & Plan:    Essential hypertension Blood pressure normal today with no red flags on exam or history - will continue current regimen - follow with PCP for hypertension    Noga Fogg A. Kennon Rounds MD, MS Family Medicine Resident PGY-3 Pager [phone]

## 2016-07-04 ENCOUNTER — Inpatient Hospital Stay (HOSPITAL_COMMUNITY)
Admission: EM | Admit: 2016-07-04 | Discharge: 2016-07-07 | DRG: 871 | Disposition: A | Discharge status: Home / Self Care | Payer: Medicare HMO | Attending: Internal Medicine | Admitting: Internal Medicine

## 2016-07-04 ENCOUNTER — Emergency Department (HOSPITAL_COMMUNITY): Payer: Medicare HMO

## 2016-07-04 ENCOUNTER — Encounter (HOSPITAL_COMMUNITY): Payer: Self-pay | Admitting: *Deleted

## 2016-07-04 DIAGNOSIS — K219 Gastro-esophageal reflux disease without esophagitis: Secondary | ICD-10-CM | POA: Diagnosis not present

## 2016-07-04 DIAGNOSIS — Z888 Allergy status to other drugs, medicaments and biological substances status: Secondary | ICD-10-CM | POA: Diagnosis not present

## 2016-07-04 DIAGNOSIS — I890 Lymphedema, not elsewhere classified: Secondary | ICD-10-CM | POA: Diagnosis not present

## 2016-07-04 DIAGNOSIS — A419 Sepsis, unspecified organism: Principal | ICD-10-CM

## 2016-07-04 DIAGNOSIS — Z96642 Presence of left artificial hip joint: Secondary | ICD-10-CM | POA: Diagnosis present

## 2016-07-04 DIAGNOSIS — Z6841 Body Mass Index (BMI) 40.0 and over, adult: Secondary | ICD-10-CM

## 2016-07-04 DIAGNOSIS — Z87828 Personal history of other (healed) physical injury and trauma: Secondary | ICD-10-CM

## 2016-07-04 DIAGNOSIS — L03119 Cellulitis of unspecified part of limb: Secondary | ICD-10-CM | POA: Diagnosis present

## 2016-07-04 DIAGNOSIS — I10 Essential (primary) hypertension: Secondary | ICD-10-CM | POA: Diagnosis not present

## 2016-07-04 DIAGNOSIS — Z87891 Personal history of nicotine dependence: Secondary | ICD-10-CM | POA: Diagnosis not present

## 2016-07-04 DIAGNOSIS — R600 Localized edema: Secondary | ICD-10-CM | POA: Diagnosis not present

## 2016-07-04 DIAGNOSIS — R52 Pain, unspecified: Secondary | ICD-10-CM | POA: Diagnosis not present

## 2016-07-04 DIAGNOSIS — Z809 Family history of malignant neoplasm, unspecified: Secondary | ICD-10-CM | POA: Diagnosis not present

## 2016-07-04 DIAGNOSIS — N179 Acute kidney failure, unspecified: Secondary | ICD-10-CM | POA: Diagnosis present

## 2016-07-04 DIAGNOSIS — Z993 Dependence on wheelchair: Secondary | ICD-10-CM | POA: Diagnosis not present

## 2016-07-04 DIAGNOSIS — Z91040 Latex allergy status: Secondary | ICD-10-CM

## 2016-07-04 DIAGNOSIS — Z833 Family history of diabetes mellitus: Secondary | ICD-10-CM

## 2016-07-04 DIAGNOSIS — R609 Edema, unspecified: Secondary | ICD-10-CM

## 2016-07-04 DIAGNOSIS — M79605 Pain in left leg: Secondary | ICD-10-CM | POA: Diagnosis not present

## 2016-07-04 DIAGNOSIS — L03116 Cellulitis of left lower limb: Secondary | ICD-10-CM

## 2016-07-04 DIAGNOSIS — S14106S Unspecified injury at C6 level of cervical spinal cord, sequela: Secondary | ICD-10-CM | POA: Diagnosis not present

## 2016-07-04 DIAGNOSIS — I5022 Chronic systolic (congestive) heart failure: Secondary | ICD-10-CM | POA: Diagnosis present

## 2016-07-04 DIAGNOSIS — G8220 Paraplegia, unspecified: Secondary | ICD-10-CM | POA: Diagnosis present

## 2016-07-04 DIAGNOSIS — G8250 Quadriplegia, unspecified: Secondary | ICD-10-CM | POA: Diagnosis present

## 2016-07-04 DIAGNOSIS — R0602 Shortness of breath: Secondary | ICD-10-CM | POA: Diagnosis not present

## 2016-07-04 DIAGNOSIS — I110 Hypertensive heart disease with heart failure: Secondary | ICD-10-CM | POA: Diagnosis present

## 2016-07-04 DIAGNOSIS — Z981 Arthrodesis status: Secondary | ICD-10-CM

## 2016-07-04 LAB — BASIC METABOLIC PANEL
Anion gap: 10 (ref 5–15)
BUN: 12 mg/dL (ref 6–20)
CALCIUM: 9.8 mg/dL (ref 8.9–10.3)
CHLORIDE: 102 mmol/L (ref 101–111)
CO2: 25 mmol/L (ref 22–32)
CREATININE: 1.2 mg/dL (ref 0.61–1.24)
GFR calc Af Amer: >60 mL/min (ref >60)
GFR calc non Af Amer: >60 mL/min (ref >60)
Glucose, Bld: 117 mg/dL — ABNORMAL HIGH (ref 65–99)
Potassium: 4 mmol/L (ref 3.5–5.1)
SODIUM: 137 mmol/L (ref 135–145)

## 2016-07-04 LAB — HEPATIC FUNCTION PANEL
ALBUMIN: 4.4 g/dL (ref 3.5–5.0)
ALT: 37 U/L (ref 17–63)
AST: 38 U/L (ref 15–41)
Alkaline Phosphatase: 52 U/L (ref 38–126)
BILIRUBIN DIRECT: 0.2 mg/dL (ref 0.1–0.5)
BILIRUBIN TOTAL: 1 mg/dL (ref 0.3–1.2)
Indirect Bilirubin: 0.8 mg/dL (ref 0.3–0.9)
Total Protein: 8.8 g/dL — ABNORMAL HIGH (ref 6.5–8.1)

## 2016-07-04 LAB — CBC WITH DIFFERENTIAL/PLATELET
BASOS PCT: 0 %
Basophils Absolute: 0 10*3/uL (ref 0.0–0.1)
EOS ABS: 0 10*3/uL (ref 0.0–0.7)
Eosinophils Relative: 0 %
HCT: 43.4 % (ref 39.0–52.0)
HEMOGLOBIN: 14.7 g/dL (ref 13.0–17.0)
LYMPHS ABS: 1 10*3/uL (ref 0.7–4.0)
Lymphocytes Relative: 5 %
MCH: 30.4 pg (ref 26.0–34.0)
MCHC: 33.9 g/dL (ref 30.0–36.0)
MCV: 89.9 fL (ref 78.0–100.0)
MONO ABS: 0.4 10*3/uL (ref 0.1–1.0)
MONOS PCT: 2 %
NEUTROS PCT: 93 %
Neutro Abs: 20.5 10*3/uL — ABNORMAL HIGH (ref 1.7–7.7)
Platelets: 160 10*3/uL (ref 150–400)
RBC: 4.83 MIL/uL (ref 4.22–5.81)
RDW: 13.2 % (ref 11.5–15.5)
WBC: 22.1 10*3/uL — ABNORMAL HIGH (ref 4.0–10.5)

## 2016-07-04 LAB — I-STAT CG4 LACTIC ACID, ED: Lactic Acid, Venous: 3.02 mmol/L (ref 0.5–1.9)

## 2016-07-04 MED ORDER — SODIUM CHLORIDE 0.9 % IV BOLUS (SEPSIS)
1000.0000 mL | Freq: Once | INTRAVENOUS | Status: AC
  Administered 2016-07-04: 1000 mL via INTRAVENOUS

## 2016-07-04 MED ORDER — IPRATROPIUM-ALBUTEROL 0.5-2.5 (3) MG/3ML IN SOLN
3.0000 mL | Freq: Once | RESPIRATORY_TRACT | Status: AC
  Administered 2016-07-04: 3 mL via RESPIRATORY_TRACT
  Filled 2016-07-04: qty 3

## 2016-07-04 MED ORDER — ACETAMINOPHEN 500 MG PO TABS
1000.0000 mg | ORAL_TABLET | Freq: Once | ORAL | Status: AC
  Administered 2016-07-04: 1000 mg via ORAL
  Filled 2016-07-04: qty 2

## 2016-07-04 MED ORDER — SODIUM CHLORIDE 0.9 % IV BOLUS (SEPSIS): 500.0000 mL | Freq: Once | INTRAVENOUS | Status: DC

## 2016-07-04 MED ORDER — VANCOMYCIN HCL IN DEXTROSE 1-5 GM/200ML-% IV SOLN
1000.0000 mg | Freq: Once | INTRAVENOUS | Status: AC
  Administered 2016-07-04: 1000 mg via INTRAVENOUS
  Filled 2016-07-04: qty 200

## 2016-07-04 NOTE — ED Triage Notes (Signed)
Pt brought in by [facility] for c/o recurrent skin infections to left lower leg
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
## 2016-07-04 NOTE — ED Notes (Addendum)
Date and time results received: 07/04/16 2044  Test: Lactic acid Critical Value: 3.02  Name of Provider Notified: Dr Joni Fears  Orders Received? Or Actions Taken?: Orders Received - See Orders for details

## 2016-07-04 NOTE — ED Notes (Signed)
Patient transported to X-ray 

## 2016-07-04 NOTE — ED Notes (Signed)
Blood cultures x 2 drawn by lab 

## 2016-07-05 DIAGNOSIS — Z87891 Personal history of nicotine dependence: Secondary | ICD-10-CM | POA: Diagnosis not present

## 2016-07-05 DIAGNOSIS — G8250 Quadriplegia, unspecified: Secondary | ICD-10-CM | POA: Diagnosis present

## 2016-07-05 DIAGNOSIS — Z809 Family history of malignant neoplasm, unspecified: Secondary | ICD-10-CM | POA: Diagnosis not present

## 2016-07-05 DIAGNOSIS — Z993 Dependence on wheelchair: Secondary | ICD-10-CM | POA: Diagnosis not present

## 2016-07-05 DIAGNOSIS — Z6841 Body Mass Index (BMI) 40.0 and over, adult: Secondary | ICD-10-CM | POA: Diagnosis not present

## 2016-07-05 DIAGNOSIS — Z833 Family history of diabetes mellitus: Secondary | ICD-10-CM | POA: Diagnosis not present

## 2016-07-05 DIAGNOSIS — L03116 Cellulitis of left lower limb: Secondary | ICD-10-CM | POA: Diagnosis present

## 2016-07-05 DIAGNOSIS — Z96642 Presence of left artificial hip joint: Secondary | ICD-10-CM | POA: Diagnosis present

## 2016-07-05 DIAGNOSIS — Z888 Allergy status to other drugs, medicaments and biological substances status: Secondary | ICD-10-CM | POA: Diagnosis not present

## 2016-07-05 DIAGNOSIS — Z981 Arthrodesis status: Secondary | ICD-10-CM | POA: Diagnosis not present

## 2016-07-05 DIAGNOSIS — A419 Sepsis, unspecified organism: Secondary | ICD-10-CM | POA: Diagnosis present

## 2016-07-05 DIAGNOSIS — K219 Gastro-esophageal reflux disease without esophagitis: Secondary | ICD-10-CM | POA: Diagnosis present

## 2016-07-05 DIAGNOSIS — I10 Essential (primary) hypertension: Secondary | ICD-10-CM | POA: Diagnosis not present

## 2016-07-05 DIAGNOSIS — I110 Hypertensive heart disease with heart failure: Secondary | ICD-10-CM | POA: Diagnosis present

## 2016-07-05 DIAGNOSIS — Z91040 Latex allergy status: Secondary | ICD-10-CM | POA: Diagnosis not present

## 2016-07-05 DIAGNOSIS — N179 Acute kidney failure, unspecified: Secondary | ICD-10-CM | POA: Diagnosis present

## 2016-07-05 DIAGNOSIS — S14106S Unspecified injury at C6 level of cervical spinal cord, sequela: Secondary | ICD-10-CM | POA: Diagnosis not present

## 2016-07-05 DIAGNOSIS — I5022 Chronic systolic (congestive) heart failure: Secondary | ICD-10-CM | POA: Diagnosis present

## 2016-07-05 LAB — URINALYSIS, ROUTINE W REFLEX MICROSCOPIC
BACTERIA UA: NONE SEEN
BILIRUBIN URINE: NEGATIVE
GLUCOSE, UA: NEGATIVE mg/dL
Ketones, ur: NEGATIVE mg/dL
NITRITE: NEGATIVE
PROTEIN: 30 mg/dL — AB
Specific Gravity, Urine: 1.03 (ref 1.005–1.030)
pH: 5 (ref 5.0–8.0)

## 2016-07-05 LAB — COMPREHENSIVE METABOLIC PANEL
ALT: 26 U/L (ref 17–63)
AST: 24 U/L (ref 15–41)
Albumin: 3.4 g/dL — ABNORMAL LOW (ref 3.5–5.0)
Alkaline Phosphatase: 43 U/L (ref 38–126)
Anion gap: 5 (ref 5–15)
BILIRUBIN TOTAL: 1.6 mg/dL — AB (ref 0.3–1.2)
BUN: 14 mg/dL (ref 6–20)
CO2: 27 mmol/L (ref 22–32)
CREATININE: 1.25 mg/dL — AB (ref 0.61–1.24)
Calcium: 8.5 mg/dL — ABNORMAL LOW (ref 8.9–10.3)
Chloride: 106 mmol/L (ref 101–111)
GFR calc Af Amer: >60 mL/min (ref >60)
Glucose, Bld: 109 mg/dL — ABNORMAL HIGH (ref 65–99)
POTASSIUM: 3.8 mmol/L (ref 3.5–5.1)
Sodium: 138 mmol/L (ref 135–145)
TOTAL PROTEIN: 6.7 g/dL (ref 6.5–8.1)

## 2016-07-05 LAB — CBC
HEMATOCRIT: 39 % (ref 39.0–52.0)
Hemoglobin: 12.8 g/dL — ABNORMAL LOW (ref 13.0–17.0)
MCH: 30 pg (ref 26.0–34.0)
MCHC: 32.8 g/dL (ref 30.0–36.0)
MCV: 91.3 fL (ref 78.0–100.0)
Platelets: 160 10*3/uL (ref 150–400)
RBC: 4.27 MIL/uL (ref 4.22–5.81)
RDW: 13.3 % (ref 11.5–15.5)
WBC: 23.1 10*3/uL — AB (ref 4.0–10.5)

## 2016-07-05 LAB — MRSA PCR SCREENING: MRSA BY PCR: NEGATIVE

## 2016-07-05 LAB — LACTIC ACID, PLASMA
Lactic Acid, Venous: 1.4 mmol/L (ref 0.5–1.9)
Lactic Acid, Venous: 0.9 mmol/L (ref 0.5–1.9)

## 2016-07-05 MED ORDER — SODIUM CHLORIDE 0.9 % IV SOLN
INTRAVENOUS | Status: AC
  Administered 2016-07-05: 02:00:00 via INTRAVENOUS

## 2016-07-05 MED ORDER — ACETAMINOPHEN 650 MG RE SUPP
650.0000 mg | Freq: Four times a day (QID) | RECTAL | Status: DC | PRN
Start: 2016-07-05 — End: 2016-07-07

## 2016-07-05 MED ORDER — SODIUM CHLORIDE 0.9 % IV SOLN: INTRAVENOUS | Status: AC

## 2016-07-05 MED ORDER — ENOXAPARIN SODIUM 40 MG/0.4ML ~~LOC~~ SOLN
40.0000 mg | SUBCUTANEOUS | Status: DC
  Administered 2016-07-05: 40 mg via SUBCUTANEOUS
  Filled 2016-07-05: qty 0.4

## 2016-07-05 MED ORDER — FUROSEMIDE 10 MG/ML IJ SOLN
20.0000 mg | Freq: Once | INTRAMUSCULAR | Status: AC
  Administered 2016-07-05: 20 mg via INTRAVENOUS
  Filled 2016-07-05: qty 2

## 2016-07-05 MED ORDER — VANCOMYCIN HCL 10 G IV SOLR
1250.0000 mg | Freq: Two times a day (BID) | INTRAVENOUS | Status: DC
  Administered 2016-07-05 – 2016-07-07 (×4): 1250 mg via INTRAVENOUS
  Filled 2016-07-05 (×7): qty 1250

## 2016-07-05 MED ORDER — ONDANSETRON HCL 4 MG/2ML IJ SOLN: 4.0000 mg | Freq: Four times a day (QID) | INTRAMUSCULAR | Status: DC | PRN

## 2016-07-05 MED ORDER — OXYCODONE HCL 5 MG PO TABS
10.0000 mg | ORAL_TABLET | ORAL | Status: DC | PRN
  Administered 2016-07-05 (×2): 10 mg via ORAL
  Filled 2016-07-05 (×2): qty 2

## 2016-07-05 MED ORDER — ACETAMINOPHEN 325 MG PO TABS
650.0000 mg | ORAL_TABLET | Freq: Four times a day (QID) | ORAL | Status: DC | PRN
  Administered 2016-07-06 – 2016-07-07 (×2): 650 mg via ORAL
  Filled 2016-07-05 (×2): qty 2

## 2016-07-05 MED ORDER — VANCOMYCIN HCL 10 G IV SOLR
1500.0000 mg | Freq: Once | INTRAVENOUS | Status: AC
  Administered 2016-07-05: 1500 mg via INTRAVENOUS
  Filled 2016-07-05 (×2): qty 1500

## 2016-07-05 MED ORDER — ONDANSETRON HCL 4 MG PO TABS: 4.0000 mg | ORAL_TABLET | Freq: Four times a day (QID) | ORAL | Status: DC | PRN

## 2016-07-05 MED ORDER — ENOXAPARIN SODIUM 100 MG/ML ~~LOC~~ SOLN
90.0000 mg | SUBCUTANEOUS | Status: DC
  Administered 2016-07-06 – 2016-07-07 (×2): 90 mg via SUBCUTANEOUS
  Filled 2016-07-05 (×2): qty 1

## 2016-07-05 NOTE — Progress Notes (Signed)
Pharmacy Antibiotic Note  Jordan Jensen is a 46 y.o. male admitted on 07/04/2016 with cellulitis.  Pharmacy has been consulted for Vancomycin dosing.  Plan: Vancomycin 2500mg  loading dose already given, then 1250mg   IV every 12 hours.  Goal trough 10-15 mcg/mL.  F/U cxs and clinical progress Monitor V/S, labs, and levels as indicated  Height: 5\' 11"  (180.3 cm) Weight: (!) 384 lb 7.7 oz (174.4 kg) IBW/kg (Calculated) : 75.3  Temp (24hrs), Avg:101.1 F (38.4 C), Min:99.3 F (37.4 C), Max:103 F (39.4 C)   Recent Labs Lab 07/04/16 2028 07/04/16 2035 07/05/16 0027 07/05/16 0508  WBC 22.1*  --   --  23.1*  CREATININE 1.20  --   --  1.25*  LATICACIDVEN  --  3.02* 1.4 0.9    Normalized CrCl is 17mls/min Estimated Creatinine Clearance: 118.7 mL/min (A) (by C-G formula based on SCr of 1.25 mg/dL (H)).    Allergies  Allergen Reactions  . Ace Inhibitors Cough  . Latex Itching and Rash    cellulitis    Antimicrobials this admission: Vancomycin 6/29 >>  Dose adjustments this admission: N/A  Microbiology results: 6/29 BCx: pending 6/30 UCx: pending 6/30 MRSA PCR: negative  Thank you for allowing pharmacy to be a part of this patient's care.  Elder Cyphers, BS Loura Back, [place] Clinical Pharmacist Pager [phone] 07/05/2016 8:49 AM

## 2016-07-05 NOTE — Progress Notes (Signed)
Pt in vent trigeminy, RR inc WOB inc MD notified. O2 [place] 2L, 20 mg Lasix, fluids now at kvo.  Cristie Hem, RN 3:40 AM 07/05/16

## 2016-07-05 NOTE — Progress Notes (Signed)
PT TRANSFERRING TO ROOM 337 ON TELEMETRY AS ORDERED.HR NSR W/ FREQUENT MULTIFOCAL PVC;S.IV IV INFUSING  W/ DIFFICULTY AT 75CC/HR. LLE 4+EDEMA; RLE 2+ EDEMA.the patient UNABLE TO GET OOB TO GET IN WHEEL CHAIR. MAXISKY LIFT USED TO TRANSFER PT TO 300 BED. TOLERATED WELL.O2 AT 3L/MIN VIA [place]. TRANSFER REPORT CALLED TO MARYBETH RN ON 300.

## 2016-07-05 NOTE — ED Notes (Signed)
Pt and family given pack of nabs (crackers) and beverage per request.
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
## 2016-07-05 NOTE — Progress Notes (Signed)
PROGRESS NOTE    Jordan Jensen  BIP:[id number] DOB: 1970/12/22 DOA: 07/04/2016 PCP: [name], DO     Brief Narrative:  46 year old man admitted to the hospital on 6/29 from home due to left lower extremity pain and swelling. He has a history of recurrent cellulitis of that leg. 2 days prior to admission he suffered an injury to his knee ; he covered it it [place] and put a compression bandage on top. That stayed there for 2 days at which point he started having burning and increased swelling. Because of his history of recurrent cellulitis he decided to come to the hospital for evaluation. On arrival was found to have a temperature of 103 with SIRS criteria and was admitted for sepsis.   Assessment & Plan:   Active Problems:   Morbid obesity (HCC)   Lymphedema   Recurrent cellulitis of lower leg   History of spinal cord injury   Cellulitis   Chronic systolic congestive heart failure (HCC)   Essential hypertension   Quadriplegia and quadriparesis (HCC)   Sepsis due to left lower extremity cellulitis -Sepsis parameters have resolved. -Continue vancomycin, culture data requested and pending. -We'll request lower extremity Doppler to rule out DVT.  Chronic systolic CHF -With an ejection fraction of 40%. -We'll give some gentle IV fluids given his sepsis given consideration to his decreased ejection fraction. -Metoprolol and Lasix remain on hold at this time.  Acute renal failure -Likely due to ongoing cellulitis and sepsis. -Give some gentle IV fluids today.  Morbid obesity    DVT prophylaxis: Lovenox  Code Status: Full code  Family Communication: Patient only  Disposition Plan: Transfer to floor  Consultants:  None Procedures:  None Antimicrobials:  Anti-infectives    Start     Dose/Rate Route Frequency Ordered Stop   07/05/16 1400  vancomycin (VANCOCIN) 1,250 mg in sodium chloride 0.9 % 250 mL IVPB     1,250 mg 166.7 mL/hr over 90 Minutes Intravenous Every 12  hours 07/05/16 0750     07/05/16 0230  vancomycin (VANCOCIN) 1,500 mg in sodium chloride 0.9 % 500 mL IVPB     1,500 mg 250 mL/hr over 120 Minutes Intravenous  Once 07/05/16 0218 07/05/16 0436   07/04/16 2000  vancomycin (VANCOCIN) IVPB 1000 mg/200 mL premix     1,000 mg 200 mL/hr over 60 Minutes Intravenous  Once 07/04/16 1954 07/04/16 2224       Subjective: Feels much improved, less pain and edema to leg  Objective: Vitals:   07/05/16 1100 07/05/16 1139 07/05/16 1200 07/05/16 1300  BP: 120/69  (!) 120/94 117/60  Pulse:      Resp: (!) 33  (!) 35 (!) 39  Temp:  98.8 F (37.1 C)    TempSrc:  Oral    SpO2:      Weight:      Height:        Intake/Output Summary (Last 24 hours) at 07/05/16 1824 Last data filed at 07/05/16 1300  Gross per 24 hour  Intake          3923.33 ml  Output             1100 ml  Net          2823.33 ml   Filed Weights   07/04/16 1914 07/05/16 0200  Weight: (!) 172.8 kg (381 lb) (!) 174.4 kg (384 lb 7.7 oz)    Examination:   General exam: Alert, awake, oriented x 3 Respiratory system: Clear  to auscultation. Respiratory effort normal. Cardiovascular system:RRR. No murmurs, rubs, gallops. Gastrointestinal system: Abdomen is nondistended, soft and nontender. No organomegaly or masses felt. Normal bowel sounds heard. Central nervous system: Alert and oriented. No focal neurological deficits. Extremities:      Skin: No rashes, lesions or ulcers Psychiatry: Judgement and insight appear normal. Mood & affect appropriate.     Data Reviewed: I have personally reviewed following labs and imaging studies  CBC:  Recent Labs Lab 07/04/16 2028 07/05/16 0508  WBC 22.1* 23.1*  NEUTROABS 20.5*  --   HGB 14.7 12.8*  HCT 43.4 39.0  MCV 89.9 91.3  PLT 160 160   Basic Metabolic Panel:  Recent Labs Lab 07/04/16 2028 07/05/16 0508  NA 137 138  K 4.0 3.8  CL 102 106  CO2 25 27  GLUCOSE 117* 109*  BUN 12 14  CREATININE 1.20 1.25*  CALCIUM  9.8 8.5*   GFR: Estimated Creatinine Clearance: 118.7 mL/min (A) (by C-G formula based on SCr of 1.25 mg/dL (H)). Liver Function Tests:  Recent Labs Lab 07/04/16 2028 07/05/16 0508  AST 38 24  ALT 37 26  ALKPHOS 52 43  BILITOT 1.0 1.6*  PROT 8.8* 6.7  ALBUMIN 4.4 3.4*   No results for input(s): LIPASE, AMYLASE in the last 168 hours. No results for input(s): AMMONIA in the last 168 hours. Coagulation Profile: No results for input(s): INR, PROTIME in the last 168 hours. Cardiac Enzymes: No results for input(s): CKTOTAL, CKMB, CKMBINDEX, TROPONINI in the last 168 hours. BNP (last 3 results) No results for input(s): PROBNP in the last 8760 hours. HbA1C: No results for input(s): HGBA1C in the last 72 hours. CBG: No results for input(s): GLUCAP in the last 168 hours. Lipid Profile: No results for input(s): CHOL, HDL, LDLCALC, TRIG, CHOLHDL, LDLDIRECT in the last 72 hours. Thyroid Function Tests: No results for input(s): TSH, T4TOTAL, FREET4, T3FREE, THYROIDAB in the last 72 hours. Anemia Panel: No results for input(s): VITAMINB12, FOLATE, FERRITIN, TIBC, IRON, RETICCTPCT in the last 72 hours. Urine analysis:    Component Value Date/Time   COLORURINE AMBER (A) 07/05/2016 0001   APPEARANCEUR HAZY (A) 07/05/2016 0001   LABSPEC 1.030 07/05/2016 0001   PHURINE 5.0 07/05/2016 0001   GLUCOSEU NEGATIVE 07/05/2016 0001   HGBUR SMALL (A) 07/05/2016 0001   BILIRUBINUR NEGATIVE 07/05/2016 0001   BILIRUBINUR NEG 06/13/2015 1650   KETONESUR NEGATIVE 07/05/2016 0001   PROTEINUR 30 (A) 07/05/2016 0001   UROBILINOGEN 1.0 06/13/2015 1650   UROBILINOGEN 1.0 04/08/2014 1800   NITRITE NEGATIVE 07/05/2016 0001   LEUKOCYTESUR TRACE (A) 07/05/2016 0001   Sepsis Labs: @LABRCNTIP (procalcitonin:4,lacticidven:4)  ) Recent Results (from the past 240 hour(s))  Blood culture (routine x 2)     Status: None (Preliminary result)   Collection Time: 07/04/16  8:28 PM  Result Value Ref Range Status     Specimen Description RIGHT ANTECUBITAL  Final   Special Requests   Final    Blood Culture results may not be optimal due to an inadequate volume of blood received in culture bottles   Culture NO GROWTH < 24 HOURS  Final   Report Status PENDING  Incomplete  Blood culture (routine x 2)     Status: None (Preliminary result)   Collection Time: 07/04/16  8:28 PM  Result Value Ref Range Status   Specimen Description BLOOD LEFT ARM  Final   Special Requests   Final    Blood Culture results may not be optimal due to an  inadequate volume of blood received in culture bottles   Culture NO GROWTH < 24 HOURS  Final   Report Status PENDING  Incomplete  MRSA PCR Screening     Status: None   Collection Time: 07/05/16  2:01 AM  Result Value Ref Range Status   MRSA by PCR NEGATIVE NEGATIVE Final    Comment:        The GeneXpert MRSA Assay (FDA approved for NASAL specimens only), is one component of a comprehensive MRSA colonization surveillance program. It is not intended to diagnose MRSA infection nor to guide or monitor treatment for MRSA infections.          Radiology Studies: Dg Chest 2 View  Result Date: 07/04/2016 CLINICAL DATA:  Fever with shortness of breath EXAM: CHEST  2 VIEW COMPARISON:  01/07/2016 FINDINGS: Partially visualized cervical spine hardware. Cardiomegaly with central vascular congestion. Streaky atelectasis at both lung bases. No pneumothorax. IMPRESSION: 1. Cardiomegaly with mild central vascular congestion 2. Streaky bibasilar opacity may reflect atelectasis Electronically Signed   By: Jasmine Pang M.D.   On: 07/04/2016 22:59        Scheduled Meds: . [START ON 07/06/2016] enoxaparin (LOVENOX) injection  90 mg Subcutaneous Q24H   Continuous Infusions: . sodium chloride    . vancomycin Stopped (07/05/16 1512)     LOS: 0 days    Time spent: 30 minutes. Greater Jensen 50% of this time was spent in direct contact with the patient coordinating  care.     Chaya Jan, MD [facility] Pager [phone]  If 7PM-7AM, please contact night-coverage [email or web address] Password [facility] 07/05/2016, 6:24 PM

## 2016-07-06 ENCOUNTER — Inpatient Hospital Stay (HOSPITAL_COMMUNITY): Payer: Medicare HMO

## 2016-07-06 LAB — URINE CULTURE: Culture: NO GROWTH

## 2016-07-06 NOTE — Plan of Care (Signed)
Problem: Pain Managment: Goal: General experience of comfort will improve Outcome: Progressing Pt c/o LLE pain, Given oxycodone x1 this shift. Pt has no other complaints. Resting quietly in room. Will continue to monitor pt

## 2016-07-06 NOTE — Progress Notes (Signed)
PROGRESS NOTE    Jordan Jensen  GNF:[id number] DOB: 05-20-1970 DOA: 07/04/2016 PCP: [name], DO     Brief Narrative:  46 year old man admitted to the hospital on 6/29 from home due to left lower extremity pain and swelling. He has a history of recurrent cellulitis of that leg. 2 days prior to admission he suffered an injury to his knee ; he covered it it [place] and put a compression bandage on top. That stayed there for 2 days at which point he started having burning and increased swelling. Because of his history of recurrent cellulitis he decided to come to the hospital for evaluation. On arrival was found to have a temperature of 103 with SIRS criteria and was admitted for sepsis.   Assessment & Plan:   Active Problems:   Morbid obesity (HCC)   Lymphedema   Recurrent cellulitis of lower leg   History of spinal cord injury   Cellulitis   Chronic systolic congestive heart failure (HCC)   Essential hypertension   Quadriplegia and quadriparesis (HCC)   Sepsis due to left lower extremity cellulitis -Sepsis parameters have resolved. -Continue vancomycin, culture data requested and pending. -Cellulitis appears improved on exam today. -Left lower extremity Doppler is negative for DVT  Chronic systolic CHF -With an ejection fraction of 40%. -We'll give some gentle IV fluids given his sepsis given consideration to his decreased ejection fraction. -Metoprolol and Lasix remain on hold at this time.  Acute renal failure -Likely due to ongoing cellulitis and sepsis. -Give some gentle IV fluids today.  Morbid obesity    DVT prophylaxis: Lovenox  Code Status: Full code  Family Communication: Patient only  Disposition Plan: Anticipate DC home in 24 hours Consultants:  None Procedures:  None Antimicrobials:  Anti-infectives    Start     Dose/Rate Route Frequency Ordered Stop   07/05/16 1400  vancomycin (VANCOCIN) 1,250 mg in sodium chloride 0.9 % 250 mL IVPB     1,250  mg 166.7 mL/hr over 90 Minutes Intravenous Every 12 hours 07/05/16 0750     07/05/16 0230  vancomycin (VANCOCIN) 1,500 mg in sodium chloride 0.9 % 500 mL IVPB     1,500 mg 250 mL/hr over 120 Minutes Intravenous  Once 07/05/16 0218 07/05/16 0436   07/04/16 2000  vancomycin (VANCOCIN) IVPB 1000 mg/200 mL premix     1,000 mg 200 mL/hr over 60 Minutes Intravenous  Once 07/04/16 1954 07/04/16 2224       Subjective: Has no complaints today, feels his left leg is less "tight"  Objective: Vitals:   07/05/16 1300 07/05/16 2024 07/06/16 0502 07/06/16 1300  BP: 117/60 117/65 132/64 (!) 109/44  Pulse:  (!) 52 91 89  Resp: (!) 39 18 18 16   Temp:  98.6 F (37 C) 98 F (36.7 C) 98.9 F (37.2 C)  TempSrc:  Oral Oral Oral  SpO2:  98% 96% 100%  Weight:      Height:        Intake/Output Summary (Last 24 hours) at 07/06/16 1825 Last data filed at 07/06/16 1744  Gross per 24 hour  Intake             1180 ml  Output             2050 ml  Net             -870 ml   Filed Weights   07/04/16 1914 07/05/16 0200  Weight: (!) 172.8 kg (381 lb) (!) 174.4 kg (  384 lb 7.7 oz)    Examination:   General exam: Alert, awake, oriented x 3 Respiratory system: Clear to auscultation. Respiratory effort normal. Cardiovascular system:RRR. No murmurs, rubs, gallops. Gastrointestinal system: Abdomen is nondistended, soft and nontender. No organomegaly or masses felt. Normal bowel sounds heard. Central nervous system: Alert and oriented. No focal neurological deficits. Extremities:        Skin: No rashes, lesions or ulcers Psychiatry: Judgement and insight appear normal. Mood & affect appropriate.     Data Reviewed: I have personally reviewed following labs and imaging studies  CBC:  Recent Labs Lab 07/04/16 2028 07/05/16 0508  WBC 22.1* 23.1*  NEUTROABS 20.5*  --   HGB 14.7 12.8*  HCT 43.4 39.0  MCV 89.9 91.3  PLT 160 160   Basic Metabolic Panel:  Recent Labs Lab 07/04/16 2028  07/05/16 0508  NA 137 138  K 4.0 3.8  CL 102 106  CO2 25 27  GLUCOSE 117* 109*  BUN 12 14  CREATININE 1.20 1.25*  CALCIUM 9.8 8.5*   GFR: Estimated Creatinine Clearance: 118.7 mL/min (A) (by C-G formula based on SCr of 1.25 mg/dL (H)). Liver Function Tests:  Recent Labs Lab 07/04/16 2028 07/05/16 0508  AST 38 24  ALT 37 26  ALKPHOS 52 43  BILITOT 1.0 1.6*  PROT 8.8* 6.7  ALBUMIN 4.4 3.4*   No results for input(s): LIPASE, AMYLASE in the last 168 hours. No results for input(s): AMMONIA in the last 168 hours. Coagulation Profile: No results for input(s): INR, PROTIME in the last 168 hours. Cardiac Enzymes: No results for input(s): CKTOTAL, CKMB, CKMBINDEX, TROPONINI in the last 168 hours. BNP (last 3 results) No results for input(s): PROBNP in the last 8760 hours. HbA1C: No results for input(s): HGBA1C in the last 72 hours. CBG: No results for input(s): GLUCAP in the last 168 hours. Lipid Profile: No results for input(s): CHOL, HDL, LDLCALC, TRIG, CHOLHDL, LDLDIRECT in the last 72 hours. Thyroid Function Tests: No results for input(s): TSH, T4TOTAL, FREET4, T3FREE, THYROIDAB in the last 72 hours. Anemia Panel: No results for input(s): VITAMINB12, FOLATE, FERRITIN, TIBC, IRON, RETICCTPCT in the last 72 hours. Urine analysis:    Component Value Date/Time   COLORURINE AMBER (A) 07/05/2016 0001   APPEARANCEUR HAZY (A) 07/05/2016 0001   LABSPEC 1.030 07/05/2016 0001   PHURINE 5.0 07/05/2016 0001   GLUCOSEU NEGATIVE 07/05/2016 0001   HGBUR SMALL (A) 07/05/2016 0001   BILIRUBINUR NEGATIVE 07/05/2016 0001   BILIRUBINUR NEG 06/13/2015 1650   KETONESUR NEGATIVE 07/05/2016 0001   PROTEINUR 30 (A) 07/05/2016 0001   UROBILINOGEN 1.0 06/13/2015 1650   UROBILINOGEN 1.0 04/08/2014 1800   NITRITE NEGATIVE 07/05/2016 0001   LEUKOCYTESUR TRACE (A) 07/05/2016 0001   Sepsis Labs: @LABRCNTIP (procalcitonin:4,lacticidven:4)  ) Recent Results (from the past 240 hour(s))  Blood  culture (routine x 2)     Status: None (Preliminary result)   Collection Time: 07/04/16  8:28 PM  Result Value Ref Range Status   Specimen Description RIGHT ANTECUBITAL  Final   Special Requests   Final    Blood Culture results may not be optimal due to an inadequate volume of blood received in culture bottles   Culture NO GROWTH 2 DAYS  Final   Report Status PENDING  Incomplete  Blood culture (routine x 2)     Status: None (Preliminary result)   Collection Time: 07/04/16  8:28 PM  Result Value Ref Range Status   Specimen Description BLOOD LEFT ARM  Final  Special Requests   Final    Blood Culture results may not be optimal due to an inadequate volume of blood received in culture bottles   Culture NO GROWTH 2 DAYS  Final   Report Status PENDING  Incomplete  Urine culture     Status: None   Collection Time: 07/05/16 12:01 AM  Result Value Ref Range Status   Specimen Description URINE, CLEAN CATCH  Final   Special Requests NONE  Final   Culture   Final    NO GROWTH Performed at [facility], [address] [place], [place] [address]    Report Status 07/06/2016 FINAL  Final  MRSA PCR Screening     Status: None   Collection Time: 07/05/16  2:01 AM  Result Value Ref Range Status   MRSA by PCR NEGATIVE NEGATIVE Final    Comment:        The GeneXpert MRSA Assay (FDA approved for NASAL specimens only), is one component of a comprehensive MRSA colonization surveillance program. It is not intended to diagnose MRSA infection nor to guide or monitor treatment for MRSA infections.          Radiology Studies: Dg Chest 2 View  Result Date: 07/04/2016 CLINICAL DATA:  Fever with shortness of breath EXAM: CHEST  2 VIEW COMPARISON:  01/07/2016 FINDINGS: Partially visualized cervical spine hardware. Cardiomegaly with central vascular congestion. Streaky atelectasis at both lung bases. No pneumothorax. IMPRESSION: 1. Cardiomegaly with mild central vascular congestion 2. Streaky  bibasilar opacity may reflect atelectasis Electronically Signed   By: Jasmine Pang M.D.   On: 07/04/2016 22:59   US Venous Img Lower Unilateral Left  Result Date: 07/06/2016 CLINICAL DATA:  LEFT lower extremity edema for 3 days, cellulitis, question deep venous thrombosis EXAM: LEFT LOWER EXTREMITY VENOUS DOPPLER ULTRASOUND TECHNIQUE: Gray-scale sonography with graded compression, as well as color Doppler and duplex ultrasound were performed to evaluate the lower extremity deep venous systems from the level of the common femoral vein and including the common femoral, femoral, profunda femoral, popliteal and calf veins including the posterior tibial, peroneal and gastrocnemius veins when visible. The superficial great saphenous vein was also interrogated. Spectral Doppler was utilized to evaluate flow at rest and with distal augmentation maneuvers in the common femoral, femoral and popliteal veins. Degradation of image quality secondary to body habitus. COMPARISON:  07/29/2013 FINDINGS: Contralateral Common Femoral Vein: Respiratory phasicity is normal and symmetric with the symptomatic side. No evidence of thrombus. Normal compressibility. Common Femoral Vein: No evidence of thrombus. Normal compressibility, respiratory phasicity and response to augmentation. Saphenofemoral Junction: No evidence of thrombus. Normal compressibility and flow on color Doppler imaging. Profunda Femoral Vein: No evidence of thrombus. Normal compressibility and flow on color Doppler imaging. Femoral Vein: No evidence of thrombus. Normal compressibility, respiratory phasicity and response to augmentation. Popliteal Vein: No evidence of thrombus. Normal compressibility, respiratory phasicity and response to augmentation. Calf Veins: No evidence of thrombus. Normal compressibility and flow on color Doppler imaging. Superficial Great Saphenous Vein: No evidence of thrombus. Normal compressibility and flow on color Doppler imaging. Venous  Reflux:  None. Other Findings:  None. IMPRESSION: No evidence of DVT within the LEFT lower extremity. Electronically Signed   By: Ulyses Southward M.D.   On: 07/06/2016 11:17        Scheduled Meds: . enoxaparin (LOVENOX) injection  90 mg Subcutaneous Q24H   Continuous Infusions: . vancomycin Stopped (07/06/16 1543)     LOS: 1 day    Time  spent: 30 minutes. Greater than 50% of this time was spent in direct contact with the patient coordinating care.     Chaya Jan, MD [facility] Pager [phone]  If 7PM-7AM, please contact night-coverage [email or web address] Password [email or web address] 07/06/2016, 6:25 PM

## 2016-07-07 LAB — BASIC METABOLIC PANEL
ANION GAP: 7 (ref 5–15)
BUN: 8 mg/dL (ref 6–20)
CHLORIDE: 102 mmol/L (ref 101–111)
CO2: 27 mmol/L (ref 22–32)
CREATININE: 0.89 mg/dL (ref 0.61–1.24)
Calcium: 8.6 mg/dL — ABNORMAL LOW (ref 8.9–10.3)
GFR calc non Af Amer: >60 mL/min (ref >60)
GLUCOSE: 99 mg/dL (ref 65–99)
Potassium: 3.6 mmol/L (ref 3.5–5.1)
Sodium: 136 mmol/L (ref 135–145)

## 2016-07-07 LAB — CBC
HCT: 36.7 % — ABNORMAL LOW (ref 39.0–52.0)
HEMOGLOBIN: 12.2 g/dL — AB (ref 13.0–17.0)
MCH: 29.9 pg (ref 26.0–34.0)
MCHC: 33.2 g/dL (ref 30.0–36.0)
MCV: 90 fL (ref 78.0–100.0)
Platelets: 153 10*3/uL (ref 150–400)
RBC: 4.08 MIL/uL — AB (ref 4.22–5.81)
RDW: 13.1 % (ref 11.5–15.5)
WBC: 11.3 10*3/uL — ABNORMAL HIGH (ref 4.0–10.5)

## 2016-07-07 LAB — VANCOMYCIN, TROUGH: Vancomycin Tr: 9 ug/mL — ABNORMAL LOW (ref 15–20)

## 2016-07-07 MED ORDER — SULFAMETHOXAZOLE-TRIMETHOPRIM 800-160 MG PO TABS: 1.0000 | ORAL_TABLET | Freq: Two times a day (BID) | ORAL | 0 refills | Status: DC

## 2016-07-07 NOTE — Progress Notes (Signed)
Discharge instructions read to patient and his daughter. Both verbalized understanding of instructions.  Discharged to home with daughter. 

## 2016-07-07 NOTE — Plan of Care (Signed)
Problem: Pain Managment: Goal: General experience of comfort will improve Outcome: Progressing Pt has had no c/o pain this shift. Resting quietly in room. Pt educated on pain mgmt and medications available. Pt verbalized understanding. Will continue to monitor pt

## 2016-07-07 NOTE — Discharge Summary (Signed)
Physician Discharge Summary  Jordan Jensen:[id number] DOB: 01/28/1970 DOA: 07/04/2016  PCP: Jordan Her, DO  Admit date: 07/04/2016 Discharge date: 07/07/2016  Time spent: 45 minutes  Recommendations for Outpatient Follow-up:  -Will be discharged home today. -Advised to follow up with PCP in 2 weeks.   Discharge Diagnoses:  Active Problems:   Morbid obesity (HCC)   Lymphedema   Recurrent cellulitis of lower leg   History of spinal cord injury   Cellulitis   Chronic systolic congestive heart failure (HCC)   Essential hypertension   Quadriplegia and quadriparesis (HCC)   Discharge Condition: Stable and improved  Filed Weights   07/04/16 1914 07/05/16 0200  Weight: (!) 172.8 kg (381 lb) (!) 174.4 kg (384 lb 7.7 oz)    History of present illness:  As per Dr. Sharl Ma on 6/29:  Jordan Jensen  is a 46 y.o. male, with history of CHF, bilateral lymphedema, history of spinal cord injury, wheelchair dependent, quadriparesis, essential hypertension, GERD who came to hospital with worsening redness and swelling of left lower extremity. Patient also has a history of recurrent cellulitis. Patient says 2 days ago he injured his knee and put a compression bandage over left leg. Today when he unwrapped his leg he noticed significant redness, warmth and swelling of the left lower extremity. Also patient had chills. Felt warm so came to hospital for further evaluation. He denies nausea vomiting or diarrhea. No chest pain. Denies shortness of breath.  In the ED when patient arrived code sepsis was ordered, as patient has temp of 103, sinus tachycardia with a rate greater than 100, patient's lactic acid was 3.02. He was started on vancomycin. And 3 L bolus of normal saline was ordered.  He denies dysuria, no abdominal pain. No headache no blurred vision. Patient is wheelchair-bound from spinal cord injury, can walk with crutches.  Hospital Course:   Sepsis due to left lower extremity  cellulitis -Sepsis parameters have resolved. -Cx data remains negative at time of DC. -Will DC on bactrim DS for 10 days. -Cellulitis appears improved on exam today. -Left lower extremity Doppler is negative for DVT -Leukocytosis down from 23.1 on admission to 11.3 on DC.  Chronic systolic CHF -With an ejection fraction of 40%. -Metoprolol and Lasix will be resumed on DC. -Euvolemic.  Acute renal failure -Likely due to ongoing cellulitis and sepsis. -Resolved at time of DC.  Morbid obesity   Procedures:  None   Consultations:  None  Discharge Instructions  Discharge Instructions    Diet - low sodium heart healthy    Complete by:  As directed    Increase activity slowly    Complete by:  As directed      Allergies as of 07/07/2016      Reactions   Ace Inhibitors Cough   Latex Itching, Rash   cellulitis      Medication List    STOP taking these medications   cephALEXin 500 MG capsule Commonly known as:  KEFLEX     TAKE these medications   Crutches-Aluminum Misc 1 pair of crutches to use as needed   furosemide 40 MG tablet Commonly known as:  LASIX Take 1 tablet (40 mg total) by mouth daily. What changed:  when to take this  reasons to take this   ibuprofen 200 MG tablet Commonly known as:  ADVIL,MOTRIN Take 400 mg by mouth every 6 (six) hours as needed for mild pain or moderate pain.   Medical Compression Stockings  Misc Wear on both legs daily   metoprolol succinate 25 MG 24 hr tablet Commonly known as:  TOPROL-XL Take 0.5 tablets (12.5 mg total) by mouth daily.   sulfamethoxazole-trimethoprim 800-160 MG tablet Commonly known as:  BACTRIM DS,SEPTRA DS Take 1 tablet by mouth 2 (two) times daily.      Allergies  Allergen Reactions  . Ace Inhibitors Cough  . Latex Itching and Rash    cellulitis   Follow-up Information    Jordan Her, DO. Schedule an appointment as soon as possible for a visit on 07/21/2016.   Why:  1:30 Contact  information: [address] [place] [address] [phone]            The results of significant diagnostics from this hospitalization (including imaging, microbiology, ancillary and laboratory) are listed below for reference.    Significant Diagnostic Studies: Dg Chest 2 View  Result Date: 07/04/2016 CLINICAL DATA:  Fever with shortness of breath EXAM: CHEST  2 VIEW COMPARISON:  01/07/2016 FINDINGS: Partially visualized cervical spine hardware. Cardiomegaly with central vascular congestion. Streaky atelectasis at both lung bases. No pneumothorax. IMPRESSION: 1. Cardiomegaly with mild central vascular congestion 2. Streaky bibasilar opacity may reflect atelectasis Electronically Signed   By: Jasmine Pang M.D.   On: 07/04/2016 22:59   US Venous Img Lower Unilateral Left  Result Date: 07/06/2016 CLINICAL DATA:  LEFT lower extremity edema for 3 days, cellulitis, question deep venous thrombosis EXAM: LEFT LOWER EXTREMITY VENOUS DOPPLER ULTRASOUND TECHNIQUE: Gray-scale sonography with graded compression, as well as color Doppler and duplex ultrasound were performed to evaluate the lower extremity deep venous systems from the level of the common femoral vein and including the common femoral, femoral, profunda femoral, popliteal and calf veins including the posterior tibial, peroneal and gastrocnemius veins when visible. The superficial great saphenous vein was also interrogated. Spectral Doppler was utilized to evaluate flow at rest and with distal augmentation maneuvers in the common femoral, femoral and popliteal veins. Degradation of image quality secondary to body habitus. COMPARISON:  07/29/2013 FINDINGS: Contralateral Common Femoral Vein: Respiratory phasicity is normal and symmetric with the symptomatic side. No evidence of thrombus. Normal compressibility. Common Femoral Vein: No evidence of thrombus. Normal compressibility, respiratory phasicity and response to augmentation.  Saphenofemoral Junction: No evidence of thrombus. Normal compressibility and flow on color Doppler imaging. Profunda Femoral Vein: No evidence of thrombus. Normal compressibility and flow on color Doppler imaging. Femoral Vein: No evidence of thrombus. Normal compressibility, respiratory phasicity and response to augmentation. Popliteal Vein: No evidence of thrombus. Normal compressibility, respiratory phasicity and response to augmentation. Calf Veins: No evidence of thrombus. Normal compressibility and flow on color Doppler imaging. Superficial Great Saphenous Vein: No evidence of thrombus. Normal compressibility and flow on color Doppler imaging. Venous Reflux:  None. Other Findings:  None. IMPRESSION: No evidence of DVT within the LEFT lower extremity. Electronically Signed   By: Ulyses Southward M.D.   On: 07/06/2016 11:17    Microbiology: Recent Results (from the past 240 hour(s))  Blood culture (routine x 2)     Status: None (Preliminary result)   Collection Time: 07/04/16  8:28 PM  Result Value Ref Range Status   Specimen Description RIGHT ANTECUBITAL  Final   Special Requests   Final    Blood Culture results may not be optimal due to an inadequate volume of blood received in culture bottles   Culture NO GROWTH 3 DAYS  Final   Report Status PENDING  Incomplete  Blood  culture (routine x 2)     Status: None (Preliminary result)   Collection Time: 07/04/16  8:28 PM  Result Value Ref Range Status   Specimen Description BLOOD LEFT ARM  Final   Special Requests   Final    Blood Culture results may not be optimal due to an inadequate volume of blood received in culture bottles   Culture NO GROWTH 3 DAYS  Final   Report Status PENDING  Incomplete  Urine culture     Status: None   Collection Time: 07/05/16 12:01 AM  Result Value Ref Range Status   Specimen Description URINE, CLEAN CATCH  Final   Special Requests NONE  Final   Culture   Final    NO GROWTH Performed at [facility],  [address] [place], [place] [address]    Report Status 07/06/2016 FINAL  Final  MRSA PCR Screening     Status: None   Collection Time: 07/05/16  2:01 AM  Result Value Ref Range Status   MRSA by PCR NEGATIVE NEGATIVE Final    Comment:        The GeneXpert MRSA Assay (FDA approved for NASAL specimens only), is one component of a comprehensive MRSA colonization surveillance program. It is not intended to diagnose MRSA infection nor to guide or monitor treatment for MRSA infections.      Labs: Basic Metabolic Panel:  Recent Labs Lab 07/04/16 2028 07/05/16 0508 07/07/16 0600  NA 137 138 136  K 4.0 3.8 3.6  CL 102 106 102  CO2 25 27 27   GLUCOSE 117* 109* 99  BUN 12 14 8   CREATININE 1.20 1.25* 0.89  CALCIUM 9.8 8.5* 8.6*   Liver Function Tests:  Recent Labs Lab 07/04/16 2028 07/05/16 0508  AST 38 24  ALT 37 26  ALKPHOS 52 43  BILITOT 1.0 1.6*  PROT 8.8* 6.7  ALBUMIN 4.4 3.4*   No results for input(s): LIPASE, AMYLASE in the last 168 hours. No results for input(s): AMMONIA in the last 168 hours. CBC:  Recent Labs Lab 07/04/16 2028 07/05/16 0508 07/07/16 0600  WBC 22.1* 23.1* 11.3*  NEUTROABS 20.5*  --   --   HGB 14.7 12.8* 12.2*  HCT 43.4 39.0 36.7*  MCV 89.9 91.3 90.0  PLT 160 160 153   Cardiac Enzymes: No results for input(s): CKTOTAL, CKMB, CKMBINDEX, TROPONINI in the last 168 hours. BNP: BNP (last 3 results)  Recent Labs  09/10/15 0011 01/07/16 2001  BNP 46.0 65.7    ProBNP (last 3 results) No results for input(s): PROBNP in the last 8760 hours.  CBG: No results for input(s): GLUCAP in the last 168 hours.     SignedChaya Jan  [facility] Pager: [phone] 07/07/2016, 1:35 PM

## 2016-07-07 NOTE — Care Management Note (Signed)
Case Management Note  Patient Details  Name: Jordan Jensen MRN: [id number] Date of Birth: September 11, 1970  Subjective/Objective:                  Admitted from home, pt lives with his daughter. Pt has 'in-complete cervical fx'. He has a WC he uses for long distances and uses crutches for short distances. He drives himself to appointments. He has PCP that he see's often. He says it will take him a few days to get back to moving about as normal but he does not feel he needs HH PT to get there.   Action/Plan: Pt plans to return home with self care. No CM needs. Pt on [facility] registry, will be automatically enrolled in [facility] transition calls for general DC.   Expected Discharge Date:    07/07/2016              Expected Discharge Plan:  Home/Self Care  In-House Referral:  NA  Discharge planning Services  CM Consult  Post Acute Care Choice:  NA Choice offered to:  NA  Status of Service:  Completed, signed off  Malcolm Metro, RN 07/07/2016, 10:41 AM

## 2016-07-07 NOTE — Care Management Important Message (Signed)
Important Message  Patient Details  Name: Jordan Jensen MRN: [id number] Date of Birth: 1970/07/18   Medicare Important Message Given:  Yes    Malcolm Metro, RN 07/07/2016, 10:50 AM

## 2016-07-09 LAB — CULTURE, BLOOD (ROUTINE X 2)
CULTURE: NO GROWTH
CULTURE: NO GROWTH

## 2016-07-21 ENCOUNTER — Ambulatory Visit: Payer: Medicare HMO | Admitting: Internal Medicine

## 2016-08-19 ENCOUNTER — Encounter: Payer: Self-pay | Admitting: Family Medicine

## 2016-08-19 ENCOUNTER — Ambulatory Visit (INDEPENDENT_AMBULATORY_CARE_PROVIDER_SITE_OTHER): Payer: Medicare HMO | Admitting: Family Medicine

## 2016-08-19 VITALS — BP 132/60 | HR 85 | Temp 98.7°F

## 2016-08-19 DIAGNOSIS — Z09 Encounter for follow-up examination after completed treatment for conditions other than malignant neoplasm: Secondary | ICD-10-CM

## 2016-08-19 NOTE — Patient Instructions (Signed)
It was great seeing you today! We have addressed the following issues today  1. Pleas make sure you make an appointment to see your PCP (Dr.Yoo) to discuss your electric scooter for work 2. Please keep using you lasix as needed, use use your compression stocking and elevation to prevent significant fluid accumulation 3. Be careful with your salt content and make sure your void on a regular basis to avoid UTI.  If we did any lab work today, and the results require attention, either me or my nurse will get in touch with you. If everything is normal, you will get a letter in mail and a message via . If you don't hear from [place] in two weeks, please give [place] a call. Otherwise, we look forward to seeing you again at your next visit. If you have any questions or concerns before then, please call the clinic at [phone].  Please bring all your medications to every doctors visit  Sign up for My Chart to have easy access to your labs results, and communication with your Primary care physician. Please ask Front Desk for some assistance.   Please check-out at the front desk before leaving the clinic.    Take Care,   Dr. Sydnee Cabal   Chronic Venous Insufficiency Chronic venous insufficiency, also called venous stasis, is a condition that prevents blood from being pumped effectively through the veins in your legs. Blood may no longer be pumped effectively from the legs back to the heart. This condition can range from mild to severe. With proper treatment, you should be able to continue with an active life. What are the causes? Chronic venous insufficiency occurs when the vein walls become stretched, weakened, or damaged, or when valves within the vein are damaged. Some common causes of this include:  High blood pressure inside the veins (venous hypertension).  Increased blood pressure in the leg veins from long periods of sitting or standing.  A blood clot that blocks blood flow in a vein (deep vein  thrombosis, DVT).  Inflammation of a vein (phlebitis) that causes a blood clot to form.  Tumors in the pelvis that cause blood to back up.  What increases the risk? The following factors may make you more likely to develop this condition:  Having a family history of this condition.  Obesity.  Pregnancy.  Living without enough physical activity or exercise (sedentary lifestyle).  Smoking.  Having a job that requires long periods of standing or sitting in one place.  Being a certain age. Women in their 32s and 68s and men in their 85s are more likely to develop this condition.  What are the signs or symptoms? Symptoms of this condition include:  Veins that are enlarged, bulging, or twisted (varicose veins).  Skin breakdown or ulcers.  Reddened or discolored skin on the front of the leg.  Brown, smooth, tight, and painful skin just above the ankle, usually on the inside of the leg (lipodermatosclerosis).  Swelling.  How is this diagnosed? This condition may be diagnosed based on:  Your medical history.  A physical exam.  Tests, such as: ? A procedure that creates an image of a blood vessel and nearby organs and provides information about blood flow through the blood vessel (duplex ultrasound). ? A procedure that tests blood flow (plethysmography). ? A procedure to look at the veins using X-ray and dye (venogram).  How is this treated? The goals of treatment are to help you return to an active life and  to minimize pain or disability. Treatment depends on the severity of your condition, and it may include:  Wearing compression stockings. These can help relieve symptoms and help prevent your condition from getting worse. However, they do not cure the condition.  Sclerotherapy. This is a procedure involving an injection of a material that "dissolves" damaged veins.  Surgery. This may involve: ? Removing a diseased vein (vein stripping). ? Cutting off blood flow  through the vein (laser ablation surgery). ? Repairing a valve.  Follow these instructions at home:  Wear compression stockings as told by your health care provider. These stockings help to prevent blood clots and reduce swelling in your legs.  Take over-the-counter and prescription medicines only as told by your health care provider.  Stay active by exercising, walking, or doing different activities. Ask your health care provider what activities are safe for you and how much exercise you need.  Drink enough fluid to keep your urine clear or pale yellow.  Do not use any products that contain nicotine or tobacco, such as cigarettes and e-cigarettes. If you need help quitting, ask your health care provider.  Keep all follow-up visits as told by your health care provider. This is important. Contact a health care provider if:  You have redness, swelling, or more pain in the affected area.  You see a red streak or line that extends up or down from the affected area.  You have skin breakdown or a loss of skin in the affected area, even if the breakdown is small.  You get an injury in the affected area. Get help right away if:  You get an injury and an open wound in the affected area.  You have severe pain that does not get better with medicine.  You have sudden numbness or weakness in the foot or ankle below the affected area, or you have trouble moving your foot or ankle.  You have a fever and you have worse or persistent symptoms.  You have chest pain.  You have shortness of breath. Summary  Chronic venous insufficiency, also called venous stasis, is a condition that prevents blood from being pumped effectively through the veins in your legs.  Chronic venous insufficiency occurs when the vein walls become stretched, weakened, or damaged, or when valves within the vein are damaged.  Treatment for this condition depends on how severe your condition is, and it may involve wearing  compression stockings or having a procedure.  Make sure you stay active by exercising, walking, or doing different activities. Ask your health care provider what activities are safe for you and how much exercise you need. This information is not intended to replace advice given to you by your health care provider. Make sure you discuss any questions you have with your health care provider. Document Released: 04/28/2006 Document Revised: 11/12/2015 Document Reviewed: 11/12/2015 Elsevier Interactive Patient Education  2017 [employer].
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
## 2016-08-31 DIAGNOSIS — Z96641 Presence of right artificial hip joint: Secondary | ICD-10-CM | POA: Diagnosis not present

## 2016-08-31 DIAGNOSIS — Z87442 Personal history of urinary calculi: Secondary | ICD-10-CM | POA: Diagnosis not present

## 2016-08-31 DIAGNOSIS — N390 Urinary tract infection, site not specified: Secondary | ICD-10-CM | POA: Diagnosis not present

## 2016-08-31 DIAGNOSIS — K219 Gastro-esophageal reflux disease without esophagitis: Secondary | ICD-10-CM | POA: Diagnosis not present

## 2016-08-31 DIAGNOSIS — N200 Calculus of kidney: Secondary | ICD-10-CM | POA: Diagnosis not present

## 2016-08-31 DIAGNOSIS — I10 Essential (primary) hypertension: Secondary | ICD-10-CM | POA: Diagnosis not present

## 2016-08-31 DIAGNOSIS — M545 Low back pain: Secondary | ICD-10-CM | POA: Diagnosis not present

## 2016-08-31 DIAGNOSIS — Z79899 Other long term (current) drug therapy: Secondary | ICD-10-CM | POA: Diagnosis not present

## 2016-09-04 DIAGNOSIS — I10 Essential (primary) hypertension: Secondary | ICD-10-CM | POA: Diagnosis not present

## 2016-09-04 DIAGNOSIS — R319 Hematuria, unspecified: Secondary | ICD-10-CM | POA: Diagnosis not present

## 2016-09-04 DIAGNOSIS — R109 Unspecified abdominal pain: Secondary | ICD-10-CM | POA: Diagnosis not present

## 2016-09-04 DIAGNOSIS — Z87891 Personal history of nicotine dependence: Secondary | ICD-10-CM | POA: Diagnosis not present

## 2016-09-04 DIAGNOSIS — M545 Low back pain: Secondary | ICD-10-CM | POA: Diagnosis not present

## 2016-09-04 DIAGNOSIS — N390 Urinary tract infection, site not specified: Secondary | ICD-10-CM | POA: Diagnosis not present

## 2016-09-04 DIAGNOSIS — Z79899 Other long term (current) drug therapy: Secondary | ICD-10-CM | POA: Diagnosis not present

## 2016-09-05 DIAGNOSIS — R109 Unspecified abdominal pain: Secondary | ICD-10-CM | POA: Diagnosis not present

## 2016-09-15 ENCOUNTER — Encounter: Payer: Self-pay | Admitting: Family Medicine

## 2016-09-15 ENCOUNTER — Ambulatory Visit (INDEPENDENT_AMBULATORY_CARE_PROVIDER_SITE_OTHER): Payer: Medicare HMO | Admitting: Family Medicine

## 2016-09-15 VITALS — BP 126/78 | HR 95 | Temp 98.1°F | Ht 71.0 in | Wt 388.0 lb

## 2016-09-15 DIAGNOSIS — Z23 Encounter for immunization: Secondary | ICD-10-CM

## 2016-09-15 DIAGNOSIS — R911 Solitary pulmonary nodule: Secondary | ICD-10-CM

## 2016-09-15 NOTE — Progress Notes (Signed)
    Subjective:  Jordan Jensen is a 46 y.o. male who presents to the [facility] today for ER follow up  HPI:  ED follow up - was seen at [facility] ED on 09/05/16 for UTI - States had kidney stones which he passed at home but was continuing to have painful urination and so went to the ED. Was found to have UTI, no kidney stones on CT abd, urine culture showed resistance so was switched to a more appropriate therapy which has helped - Feeling better after being on the right ABX, last dose today. Feels well today, - Was told to follow up with PCP about an incidental L lower pulmonary node found on CT abd at ER visit. Has copy of report with him today.  - No fever/chills, abd pain, n/v, CP, SOB - Former smoker, quit in 2008   Requesting DME - Needs compression stockings, states did not receive any for this year yet and he usually dose - Never got his shower chair after a past hospitalization, is current shower chair is very worn and close to not being able to function - Wondering if he is a candidate for electric scooter    ROS: Per HPI  Objective:  Physical Exam: BP 126/78   Pulse 95   Temp 98.1 F (36.7 C) (Oral)   Ht 5\' 11"  (1.803 m)   Wt (!) 388 lb (176 kg)   SpO2 94%   BMI 54.12 kg/m   Gen: NAD, resting comfortably in wheelchair CV: RRR with no murmurs appreciated Pulm: NWOB, CTAB with no crackles, wheezes, or rhonchi GI: Normal bowel sounds present. Soft, Nontender, Nondistended. Extremities: LE edema with compression hose in place bilaterally Psych: Normal affect and thought content   Assessment/Plan:  Pulmonary nodule, left Per PACS report of CT abd 09/05/16, incidental 69mm L lower lobe pulmonary nodule found. Patient is asymptomatic from this and is a former smoker. Will follow radiology recommendations to follow CT chest in 6-9months.  UTI, resolved Patient is doing well after ER visit and is completing his course of antibiotics appropriately. No changes at this  time.  DME needs Patient to return to clinic in 1 month to discuss further. Will look into insurance coverage and if F2F encounter requirements in the intervening time. Have spoken to RN Lauren for help figuring out these requirements.  Health maintenance - Flu shot given today - Patient to set up Medicare AWV     [name], DO PGY-2, [facility] 09/15/2016 3:07 PM

## 2016-09-15 NOTE — Patient Instructions (Addendum)
It was good to see you today!  I am glad you are doing well after your ER visit. We will repeat a CT in 6-12 months to recheck the lung nodule.  I will look into some of your durable medical equipment. Let's plan to follow up in 1 month to make sure we get these things for you.  Please check-out at the front desk before leaving the clinic. Make a Medicare Annual Wellness Visit with Nurse Lauren at your convenience.   Please bring all of your medications with you to each visit.   Sign up for My Chart to have easy access to your labs results, and communication with your primary care physician.  Feel free to call with any questions or concerns at any time, at [phone].   Take care,  Dr. [name], DO [facility]

## 2016-09-17 DIAGNOSIS — R911 Solitary pulmonary nodule: Secondary | ICD-10-CM | POA: Insufficient documentation

## 2016-09-17 NOTE — Assessment & Plan Note (Signed)
Per PACS report of CT abd 09/05/16, incidental 101mm L lower lobe pulmonary nodule found. Patient is asymptomatic from this and is a former smoker. Will follow radiology recommendations to follow CT chest in 6-44months.

## 2016-10-08 ENCOUNTER — Ambulatory Visit: Payer: Medicare HMO | Admitting: *Deleted

## 2016-10-20 ENCOUNTER — Ambulatory Visit: Payer: Medicare HMO | Admitting: Family Medicine

## 2016-10-28 ENCOUNTER — Ambulatory Visit: Payer: Medicare HMO | Admitting: *Deleted

## 2016-10-29 ENCOUNTER — Encounter: Payer: Self-pay | Admitting: Family Medicine

## 2016-10-30 ENCOUNTER — Ambulatory Visit: Payer: Medicare HMO | Admitting: Family Medicine

## 2017-01-16 ENCOUNTER — Emergency Department (HOSPITAL_COMMUNITY): Payer: Medicare HMO

## 2017-01-16 ENCOUNTER — Encounter (HOSPITAL_COMMUNITY): Payer: Self-pay | Admitting: Emergency Medicine

## 2017-01-16 ENCOUNTER — Inpatient Hospital Stay (HOSPITAL_COMMUNITY)
Admission: EM | Admit: 2017-01-16 | Discharge: 2017-01-21 | DRG: 871 | Disposition: A | Discharge status: Home / Self Care | Payer: Medicare HMO | Attending: Internal Medicine | Admitting: Internal Medicine

## 2017-01-16 DIAGNOSIS — D72829 Elevated white blood cell count, unspecified: Secondary | ICD-10-CM | POA: Diagnosis not present

## 2017-01-16 DIAGNOSIS — K219 Gastro-esophageal reflux disease without esophagitis: Secondary | ICD-10-CM | POA: Diagnosis present

## 2017-01-16 DIAGNOSIS — N179 Acute kidney failure, unspecified: Secondary | ICD-10-CM | POA: Diagnosis present

## 2017-01-16 DIAGNOSIS — R509 Fever, unspecified: Secondary | ICD-10-CM

## 2017-01-16 DIAGNOSIS — Z833 Family history of diabetes mellitus: Secondary | ICD-10-CM

## 2017-01-16 DIAGNOSIS — R0602 Shortness of breath: Secondary | ICD-10-CM | POA: Diagnosis not present

## 2017-01-16 DIAGNOSIS — A4152 Sepsis due to Pseudomonas: Secondary | ICD-10-CM | POA: Diagnosis not present

## 2017-01-16 DIAGNOSIS — I110 Hypertensive heart disease with heart failure: Secondary | ICD-10-CM | POA: Diagnosis present

## 2017-01-16 DIAGNOSIS — I10 Essential (primary) hypertension: Secondary | ICD-10-CM | POA: Diagnosis present

## 2017-01-16 DIAGNOSIS — L03116 Cellulitis of left lower limb: Secondary | ICD-10-CM | POA: Diagnosis not present

## 2017-01-16 DIAGNOSIS — Z87891 Personal history of nicotine dependence: Secondary | ICD-10-CM | POA: Diagnosis not present

## 2017-01-16 DIAGNOSIS — Z981 Arthrodesis status: Secondary | ICD-10-CM | POA: Diagnosis not present

## 2017-01-16 DIAGNOSIS — L0390 Cellulitis, unspecified: Secondary | ICD-10-CM | POA: Diagnosis present

## 2017-01-16 DIAGNOSIS — Z87828 Personal history of other (healed) physical injury and trauma: Secondary | ICD-10-CM | POA: Diagnosis not present

## 2017-01-16 DIAGNOSIS — I248 Other forms of acute ischemic heart disease: Secondary | ICD-10-CM | POA: Diagnosis not present

## 2017-01-16 DIAGNOSIS — Z993 Dependence on wheelchair: Secondary | ICD-10-CM | POA: Diagnosis not present

## 2017-01-16 DIAGNOSIS — A4150 Gram-negative sepsis, unspecified: Secondary | ICD-10-CM | POA: Diagnosis present

## 2017-01-16 DIAGNOSIS — I5023 Acute on chronic systolic (congestive) heart failure: Secondary | ICD-10-CM | POA: Diagnosis not present

## 2017-01-16 DIAGNOSIS — G8250 Quadriplegia, unspecified: Secondary | ICD-10-CM | POA: Diagnosis not present

## 2017-01-16 DIAGNOSIS — R069 Unspecified abnormalities of breathing: Secondary | ICD-10-CM | POA: Diagnosis not present

## 2017-01-16 DIAGNOSIS — Z6841 Body Mass Index (BMI) 40.0 and over, adult: Secondary | ICD-10-CM | POA: Diagnosis not present

## 2017-01-16 DIAGNOSIS — R000 Tachycardia, unspecified: Secondary | ICD-10-CM | POA: Diagnosis not present

## 2017-01-16 DIAGNOSIS — R7881 Bacteremia: Secondary | ICD-10-CM

## 2017-01-16 DIAGNOSIS — L03032 Cellulitis of left toe: Secondary | ICD-10-CM | POA: Diagnosis not present

## 2017-01-16 LAB — CBC
HEMATOCRIT: 45.4 % (ref 39.0–52.0)
HEMOGLOBIN: 14.3 g/dL (ref 13.0–17.0)
MCH: 29.5 pg (ref 26.0–34.0)
MCHC: 31.5 g/dL (ref 30.0–36.0)
MCV: 93.8 fL (ref 78.0–100.0)
Platelets: 162 10*3/uL (ref 150–400)
RBC: 4.84 MIL/uL (ref 4.22–5.81)
RDW: 13.3 % (ref 11.5–15.5)
WBC: 22.2 10*3/uL — AB (ref 4.0–10.5)

## 2017-01-16 LAB — BASIC METABOLIC PANEL
ANION GAP: 11 (ref 5–15)
BUN: 12 mg/dL (ref 6–20)
CHLORIDE: 103 mmol/L (ref 101–111)
CO2: 27 mmol/L (ref 22–32)
Calcium: 9.4 mg/dL (ref 8.9–10.3)
Creatinine, Ser: 1.31 mg/dL — ABNORMAL HIGH (ref 0.61–1.24)
GFR calc Af Amer: >60 mL/min (ref >60)
Glucose, Bld: 121 mg/dL — ABNORMAL HIGH (ref 65–99)
POTASSIUM: 4.2 mmol/L (ref 3.5–5.1)
Sodium: 141 mmol/L (ref 135–145)

## 2017-01-16 LAB — URINALYSIS, ROUTINE W REFLEX MICROSCOPIC
Bilirubin Urine: NEGATIVE
GLUCOSE, UA: NEGATIVE mg/dL
HGB URINE DIPSTICK: NEGATIVE
Ketones, ur: NEGATIVE mg/dL
LEUKOCYTES UA: NEGATIVE
NITRITE: NEGATIVE
PH: 5 (ref 5.0–8.0)
Protein, ur: 100 mg/dL — AB
SPECIFIC GRAVITY, URINE: 1.031 — AB (ref 1.005–1.030)

## 2017-01-16 LAB — LACTIC ACID, PLASMA: LACTIC ACID, VENOUS: 1.2 mmol/L (ref 0.5–1.9)

## 2017-01-16 LAB — BRAIN NATRIURETIC PEPTIDE: B NATRIURETIC PEPTIDE 5: 29 pg/mL (ref 0.0–100.0)

## 2017-01-16 LAB — TROPONIN I: Troponin I: 0.05 ng/mL (ref <0.03)

## 2017-01-16 MED ORDER — SODIUM CHLORIDE 0.9 % IV BOLUS (SEPSIS)
1000.0000 mL | Freq: Once | INTRAVENOUS | Status: AC
  Administered 2017-01-16: 1000 mL via INTRAVENOUS

## 2017-01-16 MED ORDER — ACETAMINOPHEN 325 MG PO TABS
650.0000 mg | ORAL_TABLET | Freq: Once | ORAL | Status: AC | PRN
  Administered 2017-01-16: 650 mg via ORAL
  Filled 2017-01-16: qty 2

## 2017-01-16 NOTE — ED Triage Notes (Signed)
Hx CHF a fib   Shortness of breath x 8 hours  In by EMS  DrLoo PCP  Did not take meds today- pt reports he was late for work

## 2017-01-16 NOTE — ED Notes (Signed)
ED Provider at bedside. 

## 2017-01-16 NOTE — ED Notes (Signed)
EKG done and given to Dr McManus 

## 2017-01-17 ENCOUNTER — Encounter (HOSPITAL_COMMUNITY): Payer: Self-pay | Admitting: *Deleted

## 2017-01-17 ENCOUNTER — Other Ambulatory Visit: Payer: Self-pay

## 2017-01-17 DIAGNOSIS — L03116 Cellulitis of left lower limb: Secondary | ICD-10-CM | POA: Diagnosis not present

## 2017-01-17 DIAGNOSIS — D72829 Elevated white blood cell count, unspecified: Secondary | ICD-10-CM | POA: Diagnosis not present

## 2017-01-17 DIAGNOSIS — R509 Fever, unspecified: Secondary | ICD-10-CM

## 2017-01-17 DIAGNOSIS — L0390 Cellulitis, unspecified: Secondary | ICD-10-CM | POA: Diagnosis present

## 2017-01-17 LAB — DIFFERENTIAL
BASOS ABS: 0 10*3/uL (ref 0.0–0.1)
Basophils Relative: 0 %
Eosinophils Absolute: 0 10*3/uL (ref 0.0–0.7)
Eosinophils Relative: 0 %
LYMPHS PCT: 5 %
Lymphs Abs: 1.1 10*3/uL (ref 0.7–4.0)
Monocytes Absolute: 1 10*3/uL (ref 0.1–1.0)
Monocytes Relative: 5 %
NEUTROS ABS: 18.9 10*3/uL (ref 1.7–7.7)
Neutrophils Relative %: 90 %

## 2017-01-17 LAB — TROPONIN I
TROPONIN I: 0.04 ng/mL — AB (ref <0.03)
Troponin I: 0.06 ng/mL (ref <0.03)
Troponin I: 0.06 ng/mL (ref <0.03)

## 2017-01-17 LAB — INFLUENZA PANEL BY PCR (TYPE A & B)
INFLAPCR: NEGATIVE
INFLBPCR: NEGATIVE

## 2017-01-17 MED ORDER — DEXTROSE 5 % IV SOLN
1.0000 g | INTRAVENOUS | Status: DC
  Administered 2017-01-17: 1 g via INTRAVENOUS
  Filled 2017-01-17 (×4): qty 10

## 2017-01-17 MED ORDER — SODIUM CHLORIDE 0.9 % IV SOLN
INTRAVENOUS | Status: DC
  Administered 2017-01-17 (×2): via INTRAVENOUS

## 2017-01-17 MED ORDER — METOPROLOL SUCCINATE ER 25 MG PO TB24
12.5000 mg | ORAL_TABLET | Freq: Every day | ORAL | Status: DC
  Administered 2017-01-17 – 2017-01-21 (×5): 12.5 mg via ORAL
  Filled 2017-01-17 (×5): qty 1

## 2017-01-17 MED ORDER — OXYCODONE HCL 5 MG PO TABS
5.0000 mg | ORAL_TABLET | Freq: Once | ORAL | Status: AC
  Administered 2017-01-17: 5 mg via ORAL
  Filled 2017-01-17: qty 1

## 2017-01-17 MED ORDER — FUROSEMIDE 10 MG/ML IJ SOLN
40.0000 mg | Freq: Two times a day (BID) | INTRAMUSCULAR | Status: AC
  Administered 2017-01-17 – 2017-01-18 (×2): 40 mg via INTRAVENOUS
  Filled 2017-01-17 (×2): qty 4

## 2017-01-17 MED ORDER — ENOXAPARIN SODIUM 40 MG/0.4ML ~~LOC~~ SOLN
40.0000 mg | SUBCUTANEOUS | Status: DC
  Administered 2017-01-17: 40 mg via SUBCUTANEOUS
  Filled 2017-01-17: qty 0.4

## 2017-01-17 MED ORDER — ENOXAPARIN SODIUM 100 MG/ML ~~LOC~~ SOLN
90.0000 mg | SUBCUTANEOUS | Status: DC
  Administered 2017-01-18 – 2017-01-21 (×4): 90 mg via SUBCUTANEOUS
  Filled 2017-01-17 (×4): qty 1

## 2017-01-17 MED ORDER — MEROPENEM 1 G IV SOLR
1.0000 g | Freq: Three times a day (TID) | INTRAVENOUS | Status: DC
Start: 2017-01-17 — End: 2017-01-20
  Administered 2017-01-18 – 2017-01-20 (×7): 1 g via INTRAVENOUS
  Filled 2017-01-17 (×14): qty 1

## 2017-01-17 NOTE — Progress Notes (Signed)
Pharmacy Antibiotic Note  Jordan Jensen is a 47 y.o. male admitted on 01/16/2017 with bacteremia.  Pharmacy has been consulted for Merrem dosing.  Plan: Merrem 1gm IV q8h F/U cxs and clinical progress Monitor V/S, labs  Height: 5\' 11"  (180.3 cm) Weight: (!) 397 lb 0.8 oz (180.1 kg) IBW/kg (Calculated) : 75.3  Temp (24hrs), Avg:100.6 F (38.1 C), Min:98.9 F (37.2 C), Max:102.7 F (39.3 C)  Recent Labs  Lab 01/16/17 2218  WBC 22.2*  CREATININE 1.31*  LATICACIDVEN 1.2    Estimated Creatinine Clearance: 115.6 mL/min (A) (by C-G formula based on SCr of 1.31 mg/dL (H)).    Allergies  Allergen Reactions  . Ace Inhibitors Cough  . Latex Itching and Rash    cellulitis    Antimicrobials this admission: Ceftriaxone 1/12 >> 1/12 Merrem 1/12 >>   Dose adjustments this admission: N/A  Microbiology results: 1/11 BCx: 1 of 2 bottles + GNR 1/11 UCx: pending  Thank you for allowing pharmacy to be a part of this patient's care.  Elder Cyphers, BS Loura Back, [place] Clinical Pharmacist Pager [phone] 01/17/2017 7:16 PM

## 2017-01-17 NOTE — ED Notes (Signed)
ED Provider at bedside for reeval and disposition of care.

## 2017-01-17 NOTE — Progress Notes (Signed)
CRITICAL VALUE ALERT  Critical Value:  Blood culture contained gram negative rods  Date & Time Notied:  01/17/2017 at 1845  Provider Notified: Dr Ardyth Harps  Orders Received/Actions taken: DC Rocephin, start patient on IV Meropenum

## 2017-01-17 NOTE — Progress Notes (Signed)
At 2100, PT tachypneic and breathing shallow. Sats 93% and above. RR 30-40. PT lung sounds are expiratory wheezes in upper lobes and diminished in lower lobes. PT SOB and labored at rest. HOB elevated, O2 on at 3L [place], and continuous pulse ox applied and monitoring. MD contacted. IV fluids to be held if lasix not being administered. Pt saline locked, breathing as eased and repositioned for best effort. Continue to monitor.
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
## 2017-01-17 NOTE — Plan of Care (Signed)
Central telemetry phoned nurse that patient's oxygen saturation was 75%. Nurse checked patient and his oxygen saturation was 75-80%. Nurse placed patient on 2L of oxygen and patient is now at 89%. Nurse then placed patient on 3L and he came up to 94%.

## 2017-01-17 NOTE — Progress Notes (Signed)
CRITICAL VALUE ALERT  Critical Value:  Troponin 0.06  Date & Time Notied:  01/17/2017 0830  Provider Notified: Dr. Ardyth Harps  Orders Received/Actions taken: No new orders at this time

## 2017-01-17 NOTE — Progress Notes (Signed)
Patient seen and examined, database reviewed, significant other at bedside updated on plan of care and questions answered.  Patient admitted earlier this morning due to shortness of breath and subjective fevers and chills.  He was found to have left lower extremity cellulitis and admission was requested.  He has also been found to have minimally elevated troponins in the range of 0.06, he denies chest pain.  Chest x-ray shows mild vascular congestion.  Recent echo on January 3 shows an ejection fraction of 40% with diffuse hypokinesis and normal diastolic function parameters.  We will give 2 doses of IV Lasix and follow.  Peggye Pitt, MD [facility] Pager: [phone]

## 2017-01-17 NOTE — ED Notes (Signed)
Admitting provider at the bedside.
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
## 2017-01-18 DIAGNOSIS — I110 Hypertensive heart disease with heart failure: Secondary | ICD-10-CM | POA: Diagnosis present

## 2017-01-18 DIAGNOSIS — G8250 Quadriplegia, unspecified: Secondary | ICD-10-CM | POA: Diagnosis present

## 2017-01-18 DIAGNOSIS — R7881 Bacteremia: Secondary | ICD-10-CM

## 2017-01-18 DIAGNOSIS — N179 Acute kidney failure, unspecified: Secondary | ICD-10-CM | POA: Diagnosis present

## 2017-01-18 DIAGNOSIS — I248 Other forms of acute ischemic heart disease: Secondary | ICD-10-CM | POA: Diagnosis present

## 2017-01-18 DIAGNOSIS — A4150 Gram-negative sepsis, unspecified: Secondary | ICD-10-CM | POA: Diagnosis present

## 2017-01-18 DIAGNOSIS — D72829 Elevated white blood cell count, unspecified: Secondary | ICD-10-CM | POA: Diagnosis not present

## 2017-01-18 DIAGNOSIS — A4152 Sepsis due to Pseudomonas: Secondary | ICD-10-CM | POA: Diagnosis present

## 2017-01-18 DIAGNOSIS — R0602 Shortness of breath: Secondary | ICD-10-CM | POA: Diagnosis present

## 2017-01-18 DIAGNOSIS — Z833 Family history of diabetes mellitus: Secondary | ICD-10-CM | POA: Diagnosis not present

## 2017-01-18 DIAGNOSIS — Z87828 Personal history of other (healed) physical injury and trauma: Secondary | ICD-10-CM | POA: Diagnosis not present

## 2017-01-18 DIAGNOSIS — Z6841 Body Mass Index (BMI) 40.0 and over, adult: Secondary | ICD-10-CM | POA: Diagnosis not present

## 2017-01-18 DIAGNOSIS — K219 Gastro-esophageal reflux disease without esophagitis: Secondary | ICD-10-CM | POA: Diagnosis present

## 2017-01-18 DIAGNOSIS — L03032 Cellulitis of left toe: Secondary | ICD-10-CM | POA: Diagnosis not present

## 2017-01-18 DIAGNOSIS — I10 Essential (primary) hypertension: Secondary | ICD-10-CM | POA: Diagnosis not present

## 2017-01-18 DIAGNOSIS — Z993 Dependence on wheelchair: Secondary | ICD-10-CM | POA: Diagnosis not present

## 2017-01-18 DIAGNOSIS — I5023 Acute on chronic systolic (congestive) heart failure: Secondary | ICD-10-CM | POA: Diagnosis present

## 2017-01-18 DIAGNOSIS — Z87891 Personal history of nicotine dependence: Secondary | ICD-10-CM | POA: Diagnosis not present

## 2017-01-18 DIAGNOSIS — L03116 Cellulitis of left lower limb: Secondary | ICD-10-CM | POA: Diagnosis present

## 2017-01-18 DIAGNOSIS — Z981 Arthrodesis status: Secondary | ICD-10-CM | POA: Diagnosis not present

## 2017-01-18 LAB — BASIC METABOLIC PANEL
Anion gap: 10 (ref 5–15)
BUN: 8 mg/dL (ref 6–20)
CO2: 29 mmol/L (ref 22–32)
CREATININE: 1.1 mg/dL (ref 0.61–1.24)
Calcium: 9 mg/dL (ref 8.9–10.3)
Chloride: 101 mmol/L (ref 101–111)
GFR calc Af Amer: >60 mL/min (ref >60)
GFR calc non Af Amer: >60 mL/min (ref >60)
GLUCOSE: 117 mg/dL — AB (ref 65–99)
POTASSIUM: 4 mmol/L (ref 3.5–5.1)
Sodium: 140 mmol/L (ref 135–145)

## 2017-01-18 LAB — BLOOD CULTURE ID PANEL (REFLEXED)
ACINETOBACTER BAUMANNII: NOT DETECTED
CANDIDA GLABRATA: NOT DETECTED
CANDIDA PARAPSILOSIS: NOT DETECTED
CANDIDA TROPICALIS: NOT DETECTED
Candida albicans: NOT DETECTED
Candida krusei: NOT DETECTED
Carbapenem resistance: NOT DETECTED
Enterobacter cloacae complex: NOT DETECTED
Enterobacteriaceae species: NOT DETECTED
Enterococcus species: NOT DETECTED
Escherichia coli: NOT DETECTED
HAEMOPHILUS INFLUENZAE: NOT DETECTED
KLEBSIELLA PNEUMONIAE: NOT DETECTED
Klebsiella oxytoca: NOT DETECTED
Listeria monocytogenes: NOT DETECTED
NEISSERIA MENINGITIDIS: NOT DETECTED
PROTEUS SPECIES: NOT DETECTED
Pseudomonas aeruginosa: DETECTED — AB
SERRATIA MARCESCENS: NOT DETECTED
STAPHYLOCOCCUS SPECIES: NOT DETECTED
STREPTOCOCCUS SPECIES: NOT DETECTED
Staphylococcus aureus (BCID): NOT DETECTED
Streptococcus agalactiae: NOT DETECTED
Streptococcus pneumoniae: NOT DETECTED
Streptococcus pyogenes: NOT DETECTED

## 2017-01-18 LAB — CBC
HEMATOCRIT: 41.7 % (ref 39.0–52.0)
Hemoglobin: 13.1 g/dL (ref 13.0–17.0)
MCH: 29.6 pg (ref 26.0–34.0)
MCHC: 31.4 g/dL (ref 30.0–36.0)
MCV: 94.1 fL (ref 78.0–100.0)
PLATELETS: 147 10*3/uL — AB (ref 150–400)
RBC: 4.43 MIL/uL (ref 4.22–5.81)
RDW: 13.6 % (ref 11.5–15.5)
WBC: 13.9 10*3/uL — ABNORMAL HIGH (ref 4.0–10.5)

## 2017-01-18 LAB — URINE CULTURE: Culture: NO GROWTH

## 2017-01-18 LAB — HIV ANTIBODY (ROUTINE TESTING W REFLEX): HIV Screen 4th Generation wRfx: NONREACTIVE

## 2017-01-18 MED ORDER — FUROSEMIDE 40 MG PO TABS
40.0000 mg | ORAL_TABLET | Freq: Every day | ORAL | Status: DC
  Administered 2017-01-18 – 2017-01-21 (×4): 40 mg via ORAL
  Filled 2017-01-18 (×4): qty 1

## 2017-01-18 MED ORDER — CIPROFLOXACIN IN D5W 400 MG/200ML IV SOLN
400.0000 mg | Freq: Two times a day (BID) | INTRAVENOUS | Status: DC
  Administered 2017-01-18 – 2017-01-20 (×5): 400 mg via INTRAVENOUS
  Filled 2017-01-18 (×5): qty 200

## 2017-01-18 MED ORDER — ACETAMINOPHEN 325 MG PO TABS
650.0000 mg | ORAL_TABLET | Freq: Four times a day (QID) | ORAL | Status: DC | PRN
  Administered 2017-01-18 – 2017-01-19 (×2): 650 mg via ORAL
  Filled 2017-01-18 (×2): qty 2

## 2017-01-18 MED ORDER — CIPROFLOXACIN IN D5W 400 MG/200ML IV SOLN: 400.0000 mg | Freq: Two times a day (BID) | INTRAVENOUS | Status: DC

## 2017-01-18 MED ORDER — ORAL CARE MOUTH RINSE
15.0000 mL | Freq: Two times a day (BID) | OROMUCOSAL | Status: DC
  Administered 2017-01-19 – 2017-01-21 (×3): 15 mL via OROMUCOSAL

## 2017-01-18 NOTE — Progress Notes (Signed)
MD contacted about PT desaturation and SOB. MD advised to continue lasix regimen. Monitor I&O and weight. Continue to monitor.

## 2017-01-18 NOTE — Progress Notes (Signed)
CRITICAL VALUE ALERT  Critical Value:  Positive Blood Culture ID   Date & Time Notied:  01/18/17 0145  Provider Notified: [place]  Orders Received/Actions taken: Antibiotic ordered covers ID species. Continue to administer medication.

## 2017-01-18 NOTE — Progress Notes (Signed)
PT continues to have tachypnea at rest. Pt educated to breathe deep through nose to increase O2 sat. O2 in place in nares. O2 sats desat into mid 70s if O2 is removed. Pt on continuous pulse ox to monitor O2 sat. Please continue to monitor.

## 2017-01-18 NOTE — Progress Notes (Signed)
PROGRESS NOTE    Jordan Jensen  ZOX:[id number] DOB: 1970-11-24 DOA: 01/16/2017 PCP: [name], DO     Brief Narrative:  47 year old man admitted from home on 1/11 due to shortness of breath, fever, chills and left lower extremity pain.  Found to have left lower extremity cellulitis with sepsis and admission requested.   Assessment & Plan:   Principal Problem:   Cellulitis Active Problems:   Bacteremia due to Gram-negative bacteria   Morbid obesity (HCC)   Essential hypertension   Gram-negative rod sepsis -Source of infection is presumed to be left lower extremity cellulitis, UA is negative. -BC ID with Pseudomonas aeruginosa and staph species. -Continue double coverage for now with meropenem and Cipro pending results of formal blood cultures. -Sepsis parameters have improved, blood pressure has normalized.  Elevated troponin -Troponins have been flat in the range of 0.04-0.06, no chest pain. -Suspect this is secondary to demand ischemia in a patient with chronic systolic heart failure. -No plans for further cardiac workup at this point in time.  Acute on chronic systolic heart failure -Recent echo on January 3 shows an ejection fraction of 40% with diffuse hypokinesis and normal diastolic function parameters. -Was given 2 doses of IV Lasix, respiratory status is improved, no current signs of volume overload. -We will transition Lasix back to home dose of 40 mg daily. -We will normal saline lock IV fluids at this time.  Acute renal failure -Resolved with IV fluids.  Hypertension -Continue Toprol, had one isolated episode of hypotension earlier this a.m. of 88/51, this is quickly normalized.   DVT prophylaxis: Lovenox Code Status: Full code Family Communication: Significant other at bedside updated on plan of care and questions answered Disposition Plan: Home when ready, anticipate at least 48 hours  Consultants:   None  Procedures:   None  Antimicrobials:    Anti-infectives (From admission, onward)   Start     Dose/Rate Route Frequency Ordered Stop   01/18/17 0800  ciprofloxacin (CIPRO) IVPB 400 mg  Status:  Discontinued     400 mg 200 mL/hr over 60 Minutes Intravenous Every 12 hours 01/18/17 0755 01/18/17 0800   01/18/17 0800  ciprofloxacin (CIPRO) IVPB 400 mg     400 mg 200 mL/hr over 60 Minutes Intravenous Every 12 hours 01/18/17 0756     01/17/17 1930  meropenem (MERREM) 1 g in sodium chloride 0.9 % 100 mL IVPB     1 g 200 mL/hr over 30 Minutes Intravenous Every 8 hours 01/17/17 1900     01/17/17 0515  cefTRIAXone (ROCEPHIN) 1 g in dextrose 5 % 50 mL IVPB  Status:  Discontinued     1 g 100 mL/hr over 30 Minutes Intravenous Every 24 hours 01/17/17 0502 01/17/17 1850       Subjective: Feels much improved, denies chest pain, shortness of breath that he had last night has resolved.  Objective: Vitals:   01/17/17 2349 01/18/17 0405 01/18/17 0451 01/18/17 0832  BP:   (!) 88/51 (!) 123/57  Pulse: 91 72 87 80  Resp:   (!) 28 (!) 26  Temp:   99.1 F (37.3 C) 98.3 F (36.8 C)  TempSrc:   Oral Oral  SpO2: 93% 95% 97% 98%  Weight:      Height:        Intake/Output Summary (Last 24 hours) at 01/18/2017 1315 Last data filed at 01/18/2017 0715 Gross per 24 hour  Intake 1357.5 ml  Output 1650 ml  Net -292.5 ml  Filed Weights   01/16/17 2032 01/17/17 0455  Weight: (!) 181.4 kg (400 lb) (!) 180.1 kg (397 lb 0.8 oz)    Examination:  General exam: Alert, awake, oriented x 3, morbidly obese Respiratory system: Clear to auscultation. Respiratory effort normal. Cardiovascular system:RRR. No murmurs, rubs, gallops. Gastrointestinal system: Abdomen is nondistended, soft and nontender. No organomegaly or masses felt. Normal bowel sounds heard. Central nervous system: Alert and oriented. No focal neurological deficits. Extremities: Bilateral lymphedema, left is more swollen than right, redness is improving from markings Skin: No rashes,  lesions or ulcers Psychiatry: Judgement and insight appear normal. Mood & affect appropriate.     Data Reviewed: I have personally reviewed foll 30 minutes owing labs and imaging studies  CBC: Recent Labs  Lab 01/16/17 2218 01/16/17 2310 01/18/17 0549  WBC 22.2*  --  13.9*  NEUTROABS  --  18.9  --   HGB 14.3  --  13.1  HCT 45.4  --  41.7  MCV 93.8  --  94.1  PLT 162  --  147*   Basic Metabolic Panel: Recent Labs  Lab 01/16/17 2218 01/18/17 0549  NA 141 140  K 4.2 4.0  CL 103 101  CO2 27 29  GLUCOSE 121* 117*  BUN 12 8  CREATININE 1.31* 1.10  CALCIUM 9.4 9.0   GFR: Estimated Creatinine Clearance: 137.6 mL/min (by C-G formula based on SCr of 1.1 mg/dL). Liver Function Tests: No results for input(s): AST, ALT, ALKPHOS, BILITOT, PROT, ALBUMIN in the last 168 hours. No results for input(s): LIPASE, AMYLASE in the last 168 hours. No results for input(s): AMMONIA in the last 168 hours. Coagulation Profile: No results for input(s): INR, PROTIME in the last 168 hours. Cardiac Enzymes: Recent Labs  Lab 01/16/17 2218 01/17/17 0636 01/17/17 1128 01/17/17 1727  TROPONINI 0.05* 0.06* 0.06* 0.04*   BNP (last 3 results) No results for input(s): PROBNP in the last 8760 hours. HbA1C: No results for input(s): HGBA1C in the last 72 hours. CBG: No results for input(s): GLUCAP in the last 168 hours. Lipid Profile: No results for input(s): CHOL, HDL, LDLCALC, TRIG, CHOLHDL, LDLDIRECT in the last 72 hours. Thyroid Function Tests: No results for input(s): TSH, T4TOTAL, FREET4, T3FREE, THYROIDAB in the last 72 hours. Anemia Panel: No results for input(s): VITAMINB12, FOLATE, FERRITIN, TIBC, IRON, RETICCTPCT in the last 72 hours. Urine analysis:    Component Value Date/Time   COLORURINE AMBER (A) 01/16/2017 2336   APPEARANCEUR HAZY (A) 01/16/2017 2336   LABSPEC 1.031 (H) 01/16/2017 2336   PHURINE 5.0 01/16/2017 2336   GLUCOSEU NEGATIVE 01/16/2017 2336   HGBUR NEGATIVE  01/16/2017 2336   BILIRUBINUR NEGATIVE 01/16/2017 2336   BILIRUBINUR NEG 06/13/2015 1650   KETONESUR NEGATIVE 01/16/2017 2336   PROTEINUR 100 (A) 01/16/2017 2336   UROBILINOGEN 1.0 06/13/2015 1650   UROBILINOGEN 1.0 04/08/2014 1800   NITRITE NEGATIVE 01/16/2017 2336   LEUKOCYTESUR NEGATIVE 01/16/2017 2336   Sepsis Labs: @LABRCNTIP (procalcitonin:4,lacticidven:4)  ) Recent Results (from the past 240 hour(s))  Culture, blood (routine x 2)     Status: None (Preliminary result)   Collection Time: 01/16/17 10:18 PM  Result Value Ref Range Status   Specimen Description LEFT ANTECUBITAL  Final   Special Requests   Final    BOTTLES DRAWN AEROBIC AND ANAEROBIC Blood Culture adequate volume   Culture NO GROWTH 2 DAYS  Final   Report Status PENDING  Incomplete  Culture, blood (routine x 2)     Status: None (Preliminary result)  Collection Time: 01/16/17 10:45 PM  Result Value Ref Range Status   Specimen Description BLOOD RIGHT ARM  Final   Special Requests   Final    BOTTLES DRAWN AEROBIC AND ANAEROBIC Blood Culture adequate volume   Culture  Setup Time   Final    GRAM NEGATIVE RODS Gram Stain Report Called to,Read Back By and Verified With: [facility]. AT 1837 ON 01/17/2017 BY EVA AEROBIC BOTTLE ONLY Performed at [facility] CRITICAL RESULT CALLED TO, READ BACK BY AND VERIFIED WITH: B. HARRIS,RN 0130 01/18/2017 T. TYSOR    Culture   Final    GRAM NEGATIVE RODS CULTURE REINCUBATED FOR BETTER GROWTH Performed at [facility], [address], [place], [place] [address]    Report Status PENDING  Incomplete  Blood Culture ID Panel (Reflexed)     Status: Abnormal   Collection Time: 01/16/17 10:45 PM  Result Value Ref Range Status   Enterococcus species NOT DETECTED NOT DETECTED Final   Listeria monocytogenes NOT DETECTED NOT DETECTED Final   Staphylococcus species NOT DETECTED NOT DETECTED Final   Staphylococcus aureus NOT DETECTED NOT DETECTED Final   Streptococcus  species NOT DETECTED NOT DETECTED Final   Streptococcus agalactiae NOT DETECTED NOT DETECTED Final   Streptococcus pneumoniae NOT DETECTED NOT DETECTED Final   Streptococcus pyogenes NOT DETECTED NOT DETECTED Final   Acinetobacter baumannii NOT DETECTED NOT DETECTED Final   Enterobacteriaceae species NOT DETECTED NOT DETECTED Final   Enterobacter cloacae complex NOT DETECTED NOT DETECTED Final   Escherichia coli NOT DETECTED NOT DETECTED Final   Klebsiella oxytoca NOT DETECTED NOT DETECTED Final   Klebsiella pneumoniae NOT DETECTED NOT DETECTED Final   Proteus species NOT DETECTED NOT DETECTED Final   Serratia marcescens NOT DETECTED NOT DETECTED Final   Carbapenem resistance NOT DETECTED NOT DETECTED Final   Haemophilus influenzae NOT DETECTED NOT DETECTED Final   Neisseria meningitidis NOT DETECTED NOT DETECTED Final   Pseudomonas aeruginosa DETECTED (A) NOT DETECTED Final    Comment: CRITICAL RESULT CALLED TO, READ BACK BY AND VERIFIED WITH: B. HARRIS,RN 0130 01/18/2017 T. TYSOR    Candida albicans NOT DETECTED NOT DETECTED Final   Candida glabrata NOT DETECTED NOT DETECTED Final   Candida krusei NOT DETECTED NOT DETECTED Final   Candida parapsilosis NOT DETECTED NOT DETECTED Final   Candida tropicalis NOT DETECTED NOT DETECTED Final    Comment: Performed at [facility], [address], [place], [place] [address]  Urine culture     Status: None   Collection Time: 01/16/17 11:36 PM  Result Value Ref Range Status   Specimen Description URINE, CLEAN CATCH  Final   Special Requests NONE  Final   Culture   Final    NO GROWTH Performed at [facility], [address] [place], [place] [address]    Report Status 01/18/2017 FINAL  Final         Radiology Studies: Dg Chest 2 View  Result Date: 01/16/2017 CLINICAL DATA:  47 year old male with shortness of breath. EXAM: CHEST  2 VIEW COMPARISON:  Chest radiograph dated 07/04/2016 FINDINGS: There is shallow inspiration  and bibasilar atelectasis. No focal consolidation, pleural effusion, or pneumothorax. There is mild vascular prominence. Borderline cardiomegaly. Lower cervical fixation hardware. No acute osseous pathology. IMPRESSION: Cardiomegaly with mild vascular congestion.  No focal consolidation. Electronically Signed   By: Elgie Collard M.D.   On: 01/16/2017 22:54        Scheduled Meds: . enoxaparin (LOVENOX)  injection  90 mg Subcutaneous Q24H  . [START ON 01/19/2017] mouth rinse  15 mL Mouth Rinse BID  . metoprolol succinate  12.5 mg Oral Daily   Continuous Infusions: . ciprofloxacin Stopped (01/18/17 1043)  . meropenem (MERREM) IV Stopped (01/18/17 0532)     LOS: 0 days    Time spent: 30 minutes. Greater than 50% of this time was spent in direct contact with the patient coordinating care.     Chaya Jan, MD [facility] Pager [phone]  If 7PM-7AM, please contact night-coverage [email or web address] Password [email or web address] 01/18/2017, 1:15 PM

## 2017-01-19 NOTE — Progress Notes (Signed)
PROGRESS NOTE    Jordan Jensen  ZOX:[id number] DOB: [birth year] DOA: 01/16/2017 PCP: [name], DO     Brief Narrative:  47 year old man admitted from home on 1/11 due to shortness of breath, fever, chills and left lower extremity pain.  Found to have left lower extremity cellulitis with sepsis and admission requested.   Assessment & Plan:   Principal Problem:   Cellulitis Active Problems:   Bacteremia due to Gram-negative bacteria   Morbid obesity (HCC)   Essential hypertension   Sepsis, Gram negative (HCC)   Gram-negative rod sepsis -Source of infection is presumed to be left lower extremity cellulitis, UA is negative. -BC ID with Pseudomonas aeruginosa and staph species. -Continue double coverage for now with meropenem and Cipro pending results of formal blood cultures. -Sepsis parameters have improved, blood pressure has normalized.  Elevated troponin -Troponins have been flat in the range of 0.04-0.06, no chest pain. -Suspect this is secondary to demand ischemia in a patient with chronic systolic heart failure. -No plans for further cardiac workup at this point in time.  Acute on chronic systolic heart failure -Recent echo on January 3 shows an ejection fraction of 40% with diffuse hypokinesis and normal diastolic function parameters. -Was given 2 doses of IV Lasix, respiratory status is improved, no current signs of volume overload. -We will transition Lasix back to home dose of 40 mg daily. -We will normal saline lock IV fluids at this time.  Acute renal failure -Resolved with IV fluids.  Hypertension -Continue Toprol.  DVT prophylaxis: Lovenox Code Status: Full code Family Communication: Significant other at bedside updated on plan of care and questions answered Disposition Plan: Home when ready, anticipate at least 48 hours  Consultants:   None  Procedures:   None  Antimicrobials:  Anti-infectives (From admission, onward)   Start     Dose/Rate  Route Frequency Ordered Stop   01/18/17 0800  ciprofloxacin (CIPRO) IVPB 400 mg  Status:  Discontinued     400 mg 200 mL/hr over 60 Minutes Intravenous Every 12 hours 01/18/17 0755 01/18/17 0800   01/18/17 0800  ciprofloxacin (CIPRO) IVPB 400 mg     400 mg 200 mL/hr over 60 Minutes Intravenous Every 12 hours 01/18/17 0756     01/17/17 1930  meropenem (MERREM) 1 g in sodium chloride 0.9 % 100 mL IVPB     1 g 200 mL/hr over 30 Minutes Intravenous Every 8 hours 01/17/17 1900     01/17/17 0515  cefTRIAXone (ROCEPHIN) 1 g in dextrose 5 % 50 mL IVPB  Status:  Discontinued     1 g 100 mL/hr over 30 Minutes Intravenous Every 24 hours 01/17/17 0502 01/17/17 1850       Subjective: Lying in bed, feels well, denies chest pain or shortness of breath, states left lower leg pain is much improved.  Objective: Vitals:   01/18/17 1400 01/18/17 2007 01/18/17 2100 01/19/17 0500  BP: (!) 119/54  (!) 100/44 98/62  Pulse: 71 87 89 74  Resp: 18 20 18 18   Temp: 98 F (36.7 C)  98.5 F (36.9 C) (!) 97.1 F (36.2 C)  TempSrc:   Oral Oral  SpO2: 98% 97% 99% 97%  Weight:    (!) 180.7 kg (398 lb 5.9 oz)  Height:        Intake/Output Summary (Last 24 hours) at 01/19/2017 1441 Last data filed at 01/19/2017 0548 Gross per 24 hour  Intake 1180 ml  Output 1275 ml  Net -95 ml  Filed Weights   01/16/17 2032 01/17/17 0455 01/19/17 0500  Weight: (!) 181.4 kg (400 lb) (!) 180.1 kg (397 lb 0.8 oz) (!) 180.7 kg (398 lb 5.9 oz)    Examination:  General exam: Alert, awake, oriented x 3, morbidly obese Respiratory system: Clear to auscultation. Respiratory effort normal. Cardiovascular system:RRR. No murmurs, rubs, gallops. Gastrointestinal system: Abdomen is nondistended, soft and nontender. No organomegaly or masses felt. Normal bowel sounds heard. Central nervous system: Alert and oriented. No focal neurological deficits. Extremities: Bilateral lymphedema, left is more swollen than right, redness is  improving from markings Skin: No rashes, lesions or ulcers Psychiatry: Judgement and insight appear normal. Mood & affect appropriate.     Data Reviewed: I have personally reviewed foll 30 minutes owing labs and imaging studies  CBC: Recent Labs  Lab 01/16/17 2218 01/16/17 2310 01/18/17 0549  WBC 22.2*  --  13.9*  NEUTROABS  --  18.9  --   HGB 14.3  --  13.1  HCT 45.4  --  41.7  MCV 93.8  --  94.1  PLT 162  --  147*   Basic Metabolic Panel: Recent Labs  Lab 01/16/17 2218 01/18/17 0549  NA 141 140  K 4.2 4.0  CL 103 101  CO2 27 29  GLUCOSE 121* 117*  BUN 12 8  CREATININE 1.31* 1.10  CALCIUM 9.4 9.0   GFR: Estimated Creatinine Clearance: 138 mL/min (by C-G formula based on SCr of 1.1 mg/dL). Liver Function Tests: No results for input(s): AST, ALT, ALKPHOS, BILITOT, PROT, ALBUMIN in the last 168 hours. No results for input(s): LIPASE, AMYLASE in the last 168 hours. No results for input(s): AMMONIA in the last 168 hours. Coagulation Profile: No results for input(s): INR, PROTIME in the last 168 hours. Cardiac Enzymes: Recent Labs  Lab 01/16/17 2218 01/17/17 0636 01/17/17 1128 01/17/17 1727  TROPONINI 0.05* 0.06* 0.06* 0.04*   BNP (last 3 results) No results for input(s): PROBNP in the last 8760 hours. HbA1C: No results for input(s): HGBA1C in the last 72 hours. CBG: No results for input(s): GLUCAP in the last 168 hours. Lipid Profile: No results for input(s): CHOL, HDL, LDLCALC, TRIG, CHOLHDL, LDLDIRECT in the last 72 hours. Thyroid Function Tests: No results for input(s): TSH, T4TOTAL, FREET4, T3FREE, THYROIDAB in the last 72 hours. Anemia Panel: No results for input(s): VITAMINB12, FOLATE, FERRITIN, TIBC, IRON, RETICCTPCT in the last 72 hours. Urine analysis:    Component Value Date/Time   COLORURINE AMBER (A) 01/16/2017 2336   APPEARANCEUR HAZY (A) 01/16/2017 2336   LABSPEC 1.031 (H) 01/16/2017 2336   PHURINE 5.0 01/16/2017 2336   GLUCOSEU  NEGATIVE 01/16/2017 2336   HGBUR NEGATIVE 01/16/2017 2336   BILIRUBINUR NEGATIVE 01/16/2017 2336   BILIRUBINUR NEG 06/13/2015 1650   KETONESUR NEGATIVE 01/16/2017 2336   PROTEINUR 100 (A) 01/16/2017 2336   UROBILINOGEN 1.0 06/13/2015 1650   UROBILINOGEN 1.0 04/08/2014 1800   NITRITE NEGATIVE 01/16/2017 2336   LEUKOCYTESUR NEGATIVE 01/16/2017 2336   Sepsis Labs: @LABRCNTIP (procalcitonin:4,lacticidven:4)  ) Recent Results (from the past 240 hour(s))  Culture, blood (routine x 2)     Status: None (Preliminary result)   Collection Time: 01/16/17 10:18 PM  Result Value Ref Range Status   Specimen Description LEFT ANTECUBITAL  Final   Special Requests   Final    BOTTLES DRAWN AEROBIC AND ANAEROBIC Blood Culture adequate volume   Culture NO GROWTH 3 DAYS  Final   Report Status PENDING  Incomplete  Culture, blood (routine x  2)     Status: Abnormal (Preliminary result)   Collection Time: 01/16/17 10:45 PM  Result Value Ref Range Status   Specimen Description BLOOD RIGHT ARM  Final   Special Requests   Final    BOTTLES DRAWN AEROBIC AND ANAEROBIC Blood Culture adequate volume   Culture  Setup Time   Final    GRAM NEGATIVE RODS Gram Stain Report Called to,Read Back By and Verified With: [facility]. AT 1837 ON 01/17/2017 BY EVA AEROBIC BOTTLE ONLY Performed at [facility] CRITICAL RESULT CALLED TO, READ BACK BY AND VERIFIED WITH: B. HARRIS,RN 0130 01/18/2017 T. TYSOR    Culture (A)  Final    PSEUDOMONAS AERUGINOSA SUSCEPTIBILITIES TO FOLLOW Performed at [facility], [address], [place], [place] [address]    Report Status PENDING  Incomplete  Blood Culture ID Panel (Reflexed)     Status: Abnormal   Collection Time: 01/16/17 10:45 PM  Result Value Ref Range Status   Enterococcus species NOT DETECTED NOT DETECTED Final   Listeria monocytogenes NOT DETECTED NOT DETECTED Final   Staphylococcus species NOT DETECTED NOT DETECTED Final   Staphylococcus aureus NOT DETECTED  NOT DETECTED Final   Streptococcus species NOT DETECTED NOT DETECTED Final   Streptococcus agalactiae NOT DETECTED NOT DETECTED Final   Streptococcus pneumoniae NOT DETECTED NOT DETECTED Final   Streptococcus pyogenes NOT DETECTED NOT DETECTED Final   Acinetobacter baumannii NOT DETECTED NOT DETECTED Final   Enterobacteriaceae species NOT DETECTED NOT DETECTED Final   Enterobacter cloacae complex NOT DETECTED NOT DETECTED Final   Escherichia coli NOT DETECTED NOT DETECTED Final   Klebsiella oxytoca NOT DETECTED NOT DETECTED Final   Klebsiella pneumoniae NOT DETECTED NOT DETECTED Final   Proteus species NOT DETECTED NOT DETECTED Final   Serratia marcescens NOT DETECTED NOT DETECTED Final   Carbapenem resistance NOT DETECTED NOT DETECTED Final   Haemophilus influenzae NOT DETECTED NOT DETECTED Final   Neisseria meningitidis NOT DETECTED NOT DETECTED Final   Pseudomonas aeruginosa DETECTED (A) NOT DETECTED Final    Comment: CRITICAL RESULT CALLED TO, READ BACK BY AND VERIFIED WITH: B. HARRIS,RN 0130 01/18/2017 T. TYSOR    Candida albicans NOT DETECTED NOT DETECTED Final   Candida glabrata NOT DETECTED NOT DETECTED Final   Candida krusei NOT DETECTED NOT DETECTED Final   Candida parapsilosis NOT DETECTED NOT DETECTED Final   Candida tropicalis NOT DETECTED NOT DETECTED Final    Comment: Performed at [facility], [address] [place], [place] [address]  Urine culture     Status: None   Collection Time: 01/16/17 11:36 PM  Result Value Ref Range Status   Specimen Description URINE, CLEAN CATCH  Final   Special Requests NONE  Final   Culture   Final    NO GROWTH Performed at [facility], [address] [place], [place] [address]    Report Status 01/18/2017 FINAL  Final         Radiology Studies: No results found.      Scheduled Meds: . enoxaparin (LOVENOX) injection  90 mg Subcutaneous Q24H  . furosemide  40 mg Oral Daily  . mouth rinse  15 mL Mouth Rinse  BID  . metoprolol succinate  12.5 mg Oral Daily   Continuous Infusions: . ciprofloxacin Stopped (01/19/17 0951)  . meropenem (MERREM) IV Stopped (01/19/17 1437)     LOS: 1 day    Time spent: 25 minutes. Greater than 50% of this time was spent in  direct contact with the patient coordinating care.     Chaya Jan, MD [facility] Pager [phone]  If 7PM-7AM, please contact night-coverage [email or web address] Password [facility] 01/19/2017, 2:41 PM

## 2017-01-19 NOTE — ACP (Advance Care Planning) (Signed)
Provided Advance Directives information and we briefly discussed it. Will follow up after Mr Masse reviews.

## 2017-01-20 LAB — CULTURE, BLOOD (ROUTINE X 2): SPECIAL REQUESTS: ADEQUATE

## 2017-01-20 LAB — CBC
HEMATOCRIT: 44.1 % (ref 39.0–52.0)
HEMOGLOBIN: 14 g/dL (ref 13.0–17.0)
MCH: 29.6 pg (ref 26.0–34.0)
MCHC: 31.7 g/dL (ref 30.0–36.0)
MCV: 93.2 fL (ref 78.0–100.0)
Platelets: 187 10*3/uL (ref 150–400)
RBC: 4.73 MIL/uL (ref 4.22–5.81)
RDW: 13.1 % (ref 11.5–15.5)
WBC: 11 10*3/uL — ABNORMAL HIGH (ref 4.0–10.5)

## 2017-01-20 MED ORDER — CIPROFLOXACIN IN D5W 400 MG/200ML IV SOLN
400.0000 mg | Freq: Three times a day (TID) | INTRAVENOUS | Status: DC
Start: 2017-01-20 — End: 2017-01-21
  Administered 2017-01-20 – 2017-01-21 (×2): 400 mg via INTRAVENOUS
  Filled 2017-01-20 (×3): qty 200

## 2017-01-20 NOTE — Progress Notes (Signed)
PROGRESS NOTE    Jordan Jensen  ZOX:[id number] DOB: [birth year] DOA: 01/16/2017 PCP: [name], DO     Brief Narrative:  47 year old man admitted from home on 1/11 due to shortness of breath, fever, chills and left lower extremity pain.  Found to have left lower extremity cellulitis with sepsis and admission requested.  Blood cultures with Pseudomonas.   Assessment & Plan:   Principal Problem:   Cellulitis Active Problems:   Bacteremia due to Gram-negative bacteria   Morbid obesity (HCC)   Essential hypertension   Sepsis, Gram negative (HCC)   Sepsis due to Pseudomona aeruginosa -Source of infection is presumed to be left lower extremity cellulitis, UA is negative. -Final blood cultures with pansensitive Pseudomonas. -Sepsis parameters have improved, blood pressure has normalized. -We had been double covering him with Cipro and meropenem, with results of blood cultures will go ahead and discontinue meropenem and continue high-dose Cipro as dosed per pharmacy. -He continues to clinically improve, and suspect we can likely transition him over to oral antibiotics in 24 hours.  Elevated troponin -Troponins have been flat in the range of 0.04-0.06, no chest pain. -Suspect this is secondary to demand ischemia in a patient with chronic systolic heart failure. -No plans for further cardiac workup at this point in time.  Acute on chronic systolic heart failure -Recent echo on January 3 shows an ejection fraction of 40% with diffuse hypokinesis and normal diastolic function parameters. -Was given 2 doses of IV Lasix, respiratory status is improved, no current signs of volume overload. -We will transition Lasix back to home dose of 40 mg daily. -Saline lock IV fluids at this time.  Acute renal failure -Resolved with IV fluids.  Hypertension -Continue Toprol.  DVT prophylaxis: Lovenox Code Status: Full code Family Communication: Patient only Disposition Plan: Anticipate  discharge home in 24 hours with continued clinical improvement  Consultants:   None  Procedures:   None  Antimicrobials:  Anti-infectives (From admission, onward)   Start     Dose/Rate Route Frequency Ordered Stop   01/20/17 1600  ciprofloxacin (CIPRO) IVPB 400 mg     400 mg 200 mL/hr over 60 Minutes Intravenous Every 8 hours 01/20/17 1007     01/18/17 0800  ciprofloxacin (CIPRO) IVPB 400 mg  Status:  Discontinued     400 mg 200 mL/hr over 60 Minutes Intravenous Every 12 hours 01/18/17 0755 01/18/17 0800   01/18/17 0800  ciprofloxacin (CIPRO) IVPB 400 mg  Status:  Discontinued     400 mg 200 mL/hr over 60 Minutes Intravenous Every 12 hours 01/18/17 0756 01/20/17 1007   01/17/17 1930  meropenem (MERREM) 1 g in sodium chloride 0.9 % 100 mL IVPB  Status:  Discontinued     1 g 200 mL/hr over 30 Minutes Intravenous Every 8 hours 01/17/17 1900 01/20/17 1007   01/17/17 0515  cefTRIAXone (ROCEPHIN) 1 g in dextrose 5 % 50 mL IVPB  Status:  Discontinued     1 g 100 mL/hr over 30 Minutes Intravenous Every 24 hours 01/17/17 0502 01/17/17 1850       Subjective: Sitting in chair at bedside, feels much improved, states he feels almost back to his baseline.  Objective: Vitals:   01/19/17 2158 01/19/17 2205 01/20/17 0455 01/20/17 1102  BP: 104/69  103/60 112/77  Pulse: 81  87 90  Resp: 20  20   Temp: 98.6 F (37 C)  98.5 F (36.9 C)   TempSrc: Oral  Oral   SpO2: 96%  94% 97% 97%  Weight:   (!) 180.4 kg (397 lb 11.4 oz)   Height:        Intake/Output Summary (Last 24 hours) at 01/20/2017 1735 Last data filed at 01/20/2017 0900 Gross per 24 hour  Intake 1120 ml  Output 650 ml  Net 470 ml   Filed Weights   01/17/17 0455 01/19/17 0500 01/20/17 0455  Weight: (!) 180.1 kg (397 lb 0.8 oz) (!) 180.7 kg (398 lb 5.9 oz) (!) 180.4 kg (397 lb 11.4 oz)    Examination:  General exam: Alert, awake, oriented x 3, morbidly obese Respiratory system: Clear to auscultation. Respiratory  effort normal. Cardiovascular system:RRR. No murmurs, rubs, gallops. Gastrointestinal system: Abdomen is nondistended, soft and nontender. No organomegaly or masses felt. Normal bowel sounds heard. Central nervous system: Alert and oriented. No focal neurological deficits. Extremities: He has bilateral lymphedema, redness on left leg has significantly reduced beyond the margins marked and is almost back to normal as per patient. Skin: No rashes, lesions or ulcers Psychiatry: Judgement and insight appear normal. Mood & affect appropriate.      Data Reviewed: I have personally reviewed foll 30 minutes owing labs and imaging studies  CBC: Recent Labs  Lab 01/16/17 2218 01/16/17 2310 01/18/17 0549 01/20/17 0501  WBC 22.2*  --  13.9* 11.0*  NEUTROABS  --  18.9  --   --   HGB 14.3  --  13.1 14.0  HCT 45.4  --  41.7 44.1  MCV 93.8  --  94.1 93.2  PLT 162  --  147* 187   Basic Metabolic Panel: Recent Labs  Lab 01/16/17 2218 01/18/17 0549  NA 141 140  K 4.2 4.0  CL 103 101  CO2 27 29  GLUCOSE 121* 117*  BUN 12 8  CREATININE 1.31* 1.10  CALCIUM 9.4 9.0   GFR: Estimated Creatinine Clearance: 137.7 mL/min (by C-G formula based on SCr of 1.1 mg/dL). Liver Function Tests: No results for input(s): AST, ALT, ALKPHOS, BILITOT, PROT, ALBUMIN in the last 168 hours. No results for input(s): LIPASE, AMYLASE in the last 168 hours. No results for input(s): AMMONIA in the last 168 hours. Coagulation Profile: No results for input(s): INR, PROTIME in the last 168 hours. Cardiac Enzymes: Recent Labs  Lab 01/16/17 2218 01/17/17 0636 01/17/17 1128 01/17/17 1727  TROPONINI 0.05* 0.06* 0.06* 0.04*   BNP (last 3 results) No results for input(s): PROBNP in the last 8760 hours. HbA1C: No results for input(s): HGBA1C in the last 72 hours. CBG: No results for input(s): GLUCAP in the last 168 hours. Lipid Profile: No results for input(s): CHOL, HDL, LDLCALC, TRIG, CHOLHDL, LDLDIRECT in  the last 72 hours. Thyroid Function Tests: No results for input(s): TSH, T4TOTAL, FREET4, T3FREE, THYROIDAB in the last 72 hours. Anemia Panel: No results for input(s): VITAMINB12, FOLATE, FERRITIN, TIBC, IRON, RETICCTPCT in the last 72 hours. Urine analysis:    Component Value Date/Time   COLORURINE AMBER (A) 01/16/2017 2336   APPEARANCEUR HAZY (A) 01/16/2017 2336   LABSPEC 1.031 (H) 01/16/2017 2336   PHURINE 5.0 01/16/2017 2336   GLUCOSEU NEGATIVE 01/16/2017 2336   HGBUR NEGATIVE 01/16/2017 2336   BILIRUBINUR NEGATIVE 01/16/2017 2336   BILIRUBINUR NEG 06/13/2015 1650   KETONESUR NEGATIVE 01/16/2017 2336   PROTEINUR 100 (A) 01/16/2017 2336   UROBILINOGEN 1.0 06/13/2015 1650   UROBILINOGEN 1.0 04/08/2014 1800   NITRITE NEGATIVE 01/16/2017 2336   LEUKOCYTESUR NEGATIVE 01/16/2017 2336   Sepsis Labs: @LABRCNTIP (procalcitonin:4,lacticidven:4)  ) Recent Results (  from the past 240 hour(s))  Culture, blood (routine x 2)     Status: None (Preliminary result)   Collection Time: 01/16/17 10:18 PM  Result Value Ref Range Status   Specimen Description LEFT ANTECUBITAL  Final   Special Requests   Final    BOTTLES DRAWN AEROBIC AND ANAEROBIC Blood Culture adequate volume   Culture NO GROWTH 4 DAYS  Final   Report Status PENDING  Incomplete  Culture, blood (routine x 2)     Status: Abnormal   Collection Time: 01/16/17 10:45 PM  Result Value Ref Range Status   Specimen Description BLOOD RIGHT ARM  Final   Special Requests   Final    BOTTLES DRAWN AEROBIC AND ANAEROBIC Blood Culture adequate volume   Culture  Setup Time   Final    GRAM NEGATIVE RODS Gram Stain Report Called to,Read Back By and Verified With: [facility]. AT 1837 ON 01/17/2017 BY EVA AEROBIC BOTTLE ONLY Performed at [facility] CRITICAL RESULT CALLED TO, READ BACK BY AND VERIFIED WITH: B. HARRIS,RN 0130 01/18/2017 Girtha Hake Performed at [facility], [address] [place], [place] [address]    Culture  PSEUDOMONAS AERUGINOSA (A)  Final   Report Status 01/20/2017 FINAL  Final   Organism ID, Bacteria PSEUDOMONAS AERUGINOSA  Final      Susceptibility   Pseudomonas aeruginosa - MIC*    CEFTAZIDIME 2 SENSITIVE Sensitive     CIPROFLOXACIN <=0.25 SENSITIVE Sensitive     GENTAMICIN <=1 SENSITIVE Sensitive     IMIPENEM 1 SENSITIVE Sensitive     PIP/TAZO <=4 SENSITIVE Sensitive     CEFEPIME <=1 SENSITIVE Sensitive     * PSEUDOMONAS AERUGINOSA  Blood Culture ID Panel (Reflexed)     Status: Abnormal   Collection Time: 01/16/17 10:45 PM  Result Value Ref Range Status   Enterococcus species NOT DETECTED NOT DETECTED Final   Listeria monocytogenes NOT DETECTED NOT DETECTED Final   Staphylococcus species NOT DETECTED NOT DETECTED Final   Staphylococcus aureus NOT DETECTED NOT DETECTED Final   Streptococcus species NOT DETECTED NOT DETECTED Final   Streptococcus agalactiae NOT DETECTED NOT DETECTED Final   Streptococcus pneumoniae NOT DETECTED NOT DETECTED Final   Streptococcus pyogenes NOT DETECTED NOT DETECTED Final   Acinetobacter baumannii NOT DETECTED NOT DETECTED Final   Enterobacteriaceae species NOT DETECTED NOT DETECTED Final   Enterobacter cloacae complex NOT DETECTED NOT DETECTED Final   Escherichia coli NOT DETECTED NOT DETECTED Final   Klebsiella oxytoca NOT DETECTED NOT DETECTED Final   Klebsiella pneumoniae NOT DETECTED NOT DETECTED Final   Proteus species NOT DETECTED NOT DETECTED Final   Serratia marcescens NOT DETECTED NOT DETECTED Final   Carbapenem resistance NOT DETECTED NOT DETECTED Final   Haemophilus influenzae NOT DETECTED NOT DETECTED Final   Neisseria meningitidis NOT DETECTED NOT DETECTED Final   Pseudomonas aeruginosa DETECTED (A) NOT DETECTED Final    Comment: CRITICAL RESULT CALLED TO, READ BACK BY AND VERIFIED WITH: B. HARRIS,RN 0130 01/18/2017 T. TYSOR    Candida albicans NOT DETECTED NOT DETECTED Final   Candida glabrata NOT DETECTED NOT DETECTED Final    Candida krusei NOT DETECTED NOT DETECTED Final   Candida parapsilosis NOT DETECTED NOT DETECTED Final   Candida tropicalis NOT DETECTED NOT DETECTED Final    Comment: Performed at [facility], [address], [place], [place] [address]  Urine culture     Status: None   Collection Time: 01/16/17 11:36 PM  Result Value Ref  Range Status   Specimen Description URINE, CLEAN CATCH  Final   Special Requests NONE  Final   Culture   Final    NO GROWTH Performed at [facility], [address], [place], [place] [address]    Report Status 01/18/2017 FINAL  Final         Radiology Studies: No results found.      Scheduled Meds: . enoxaparin (LOVENOX) injection  90 mg Subcutaneous Q24H  . furosemide  40 mg Oral Daily  . mouth rinse  15 mL Mouth Rinse BID  . metoprolol succinate  12.5 mg Oral Daily   Continuous Infusions: . ciprofloxacin       LOS: 2 days    Time spent: 25 minutes. Greater than 50% of this time was spent in direct contact with the patient coordinating care.     Chaya Jan, MD [facility] Pager [phone]  If 7PM-7AM, please contact night-coverage [email or web address] Password [facility] 01/20/2017, 5:35 PM

## 2017-01-20 NOTE — Progress Notes (Signed)
Pharmacy Antibiotic Note  Jordan Jensen is a 47 y.o. male admitted on 01/16/2017 with bacteremia.  Pharmacy has been consulted for Cipro dosing.  D/W Dr Ardyth Harps.  Plan: Cipro 400mg  IV q8h (dosing for pseudomonas) F/U cxs and clinical progress Monitor V/S, labs  Height: 5\' 11"  (180.3 cm) Weight: (!) 397 lb 11.4 oz (180.4 kg) IBW/kg (Calculated) : 75.3  Temp (24hrs), Avg:98.5 F (36.9 C), Min:98.5 F (36.9 C), Max:98.6 F (37 C)  Recent Labs  Lab 01/16/17 2218 01/18/17 0549 01/20/17 0501  WBC 22.2* 13.9* 11.0*  CREATININE 1.31* 1.10  --   LATICACIDVEN 1.2  --   --     Estimated Creatinine Clearance: 137.7 mL/min (by C-G formula based on SCr of 1.1 mg/dL).    Allergies  Allergen Reactions  . Ace Inhibitors Cough  . Latex Itching and Rash    cellulitis   Antimicrobials this admission: Ceftriaxone 1/12 >> 1/12 Merrem 1/12 >> 1/15 Cipro 1/13 >>  Dose adjustments this admission: N/A  Microbiology results: 1/11 BCx: 1 of 2 bottles + GNR, pseudomonas S to CIPRO 1/11 UCx: pending  Thank you for allowing pharmacy to be a part of this patient's care.  Valrie Hart, PharmD Clinical Pharmacist Pager:  [phone] 01/20/2017

## 2017-01-21 DIAGNOSIS — L03032 Cellulitis of left toe: Secondary | ICD-10-CM

## 2017-01-21 DIAGNOSIS — A4150 Gram-negative sepsis, unspecified: Secondary | ICD-10-CM

## 2017-01-21 DIAGNOSIS — D72829 Elevated white blood cell count, unspecified: Secondary | ICD-10-CM

## 2017-01-21 LAB — CULTURE, BLOOD (ROUTINE X 2)
Culture: NO GROWTH
Special Requests: ADEQUATE

## 2017-01-21 MED ORDER — CIPROFLOXACIN HCL 250 MG PO TABS: 750.0000 mg | ORAL_TABLET | Freq: Two times a day (BID) | ORAL | 0 refills | Status: AC

## 2017-01-21 NOTE — Discharge Summary (Signed)
Physician Discharge Summary  [name]:[id number] DOB: [birth year] DOA: 01/16/2017  PCP: [name], DO  Admit date: 01/16/2017 Discharge date: 01/21/2017  Time spent: 45 minutes  Recommendations for Outpatient Follow-up:  -Will be discharged home today. -Advised to follow-up with primary care provider in 2 weeks. -Will complete a course of high-dose Cipro for Pseudomonas bacteremia and cellulitis.  Discharge Diagnoses:  Principal Problem:   Cellulitis Active Problems:   Bacteremia due to Gram-negative bacteria   Morbid obesity (HCC)   Essential hypertension   Sepsis, Gram negative (HCC)   Discharge Condition: Stable and improved  Filed Weights   01/19/17 0500 01/20/17 0455 01/21/17 0552  Weight: (!) 180.7 kg (398 lb 5.9 oz) (!) 180.4 kg (397 lb 11.4 oz) (!) 182.1 kg (401 lb 7.3 oz)    History of present illness:  As per Dr. Sharl Ma on 1/12: Jordan Jensen  is a 47 y.o. male, with history of CHF, bilateral lymphedema, spinal cord injury, visa dependent, quality places, essential hypertension, Genella Rife came to hospital with complaints of fever, chills. He denies chest pain or shortness of breath. He has been complaining of back pain. Patient does have a history of cellulitis in the past. In the ED in the ED, patient found to have left lower extremity cellulitis. He denies recent fall no nausea vomiting or diarrhea. No previous history of stroke or seizure. He denies dysuria.    Hospital Course:   Sepsis due to Pseudomona aeruginosa -Source of infection is presumed to be left lower extremity cellulitis, UA is negative. -Final blood cultures with pansensitive Pseudomonas. -Sepsis parameters have improved, blood pressure has normalized. -We had been double covering him with Cipro and meropenem, with results of blood cultures was transitioned to IV Cipro high-dose as a single agent. -As he continues to improve and sepsis parameters have resolved, will be transitioned to  high-dose oral Cipro today 750 mg twice daily per pharmacy recommendations for discharge home, will complete an extra 7 days of treatment.  Elevated troponin -Troponins have been flat in the range of 0.04-0.06, no chest pain. -Suspect this is secondary to demand ischemia in a patient with chronic systolic heart failure. -No plans for further cardiac workup at this point in time.  Acute on chronic systolic heart failure -Recent echo on January 3 shows an ejection fraction of 40% with diffuse hypokinesis and normal diastolic function parameters. -Was given 2 doses of IV Lasix, respiratory status is improved, no current signs of volume overload. -We will transition Lasix back to home dose of 40 mg daily. -Saline lock IV fluids at this time.  Acute renal failure -Resolved with IV fluids.  Hypertension -Continue Toprol.     Procedures:  None   Consultations:  None  Discharge Instructions  Discharge Instructions    Diet - low sodium heart healthy   Complete by:  As directed    Increase activity slowly   Complete by:  As directed      Allergies as of 01/21/2017      Reactions   Ace Inhibitors Cough   Latex Itching, Rash   cellulitis      Medication List    TAKE these medications   ciprofloxacin 250 MG tablet Commonly known as:  CIPRO Take 3 tablets (750 mg total) by mouth 2 (two) times daily for 10 days.   famotidine 20 MG tablet Commonly known as:  PEPCID Take 20 mg by mouth daily as needed for heartburn or indigestion.   furosemide  40 MG tablet Commonly known as:  LASIX Take 1 tablet (40 mg total) by mouth daily. What changed:    when to take this  reasons to take this   ibuprofen 200 MG tablet Commonly known as:  ADVIL,MOTRIN Take 400 mg by mouth every 6 (six) hours as needed for mild pain or moderate pain.   metoprolol succinate 25 MG 24 hr tablet Commonly known as:  TOPROL-XL Take 0.5 tablets (12.5 mg total) by mouth daily.      Allergies    Allergen Reactions  . Ace Inhibitors Cough  . Latex Itching and Rash    cellulitis   Follow-up Information    [name], DO On 02/03/2017.   Specialty:  Family Medicine Why:  at 8:30 am Contact information: [address] [place] [address] [phone]            The results of significant diagnostics from this hospitalization (including imaging, microbiology, ancillary and laboratory) are listed below for reference.    Significant Diagnostic Studies: Dg Chest 2 View  Result Date: 01/16/2017 CLINICAL DATA:  47 year old male with shortness of breath. EXAM: CHEST  2 VIEW COMPARISON:  Chest radiograph dated 07/04/2016 FINDINGS: There is shallow inspiration and bibasilar atelectasis. No focal consolidation, pleural effusion, or pneumothorax. There is mild vascular prominence. Borderline cardiomegaly. Lower cervical fixation hardware. No acute osseous pathology. IMPRESSION: Cardiomegaly with mild vascular congestion.  No focal consolidation. Electronically Signed   By: Elgie Collard M.D.   On: 01/16/2017 22:54    Microbiology: Recent Results (from the past 240 hour(s))  Culture, blood (routine x 2)     Status: None   Collection Time: 01/16/17 10:18 PM  Result Value Ref Range Status   Specimen Description LEFT ANTECUBITAL  Final   Special Requests   Final    BOTTLES DRAWN AEROBIC AND ANAEROBIC Blood Culture adequate volume   Culture NO GROWTH 5 DAYS  Final   Report Status 01/21/2017 FINAL  Final  Culture, blood (routine x 2)     Status: Abnormal   Collection Time: 01/16/17 10:45 PM  Result Value Ref Range Status   Specimen Description BLOOD RIGHT ARM  Final   Special Requests   Final    BOTTLES DRAWN AEROBIC AND ANAEROBIC Blood Culture adequate volume   Culture  Setup Time   Final    GRAM NEGATIVE RODS Gram Stain Report Called to,Read Back By and Verified With: [facility] AT 1837 ON 01/17/2017 BY EVA AEROBIC BOTTLE ONLY Performed at [facility] CRITICAL  RESULT CALLED TO, READ BACK BY AND VERIFIED WITH: B. HARRIS,RN 0130 01/18/2017 Girtha Hake Performed at [facility], [address], [place], [place] [address]    Culture PSEUDOMONAS AERUGINOSA (A)  Final   Report Status 01/20/2017 FINAL  Final   Organism ID, Bacteria PSEUDOMONAS AERUGINOSA  Final      Susceptibility   Pseudomonas aeruginosa - MIC*    CEFTAZIDIME 2 SENSITIVE Sensitive     CIPROFLOXACIN <=0.25 SENSITIVE Sensitive     GENTAMICIN <=1 SENSITIVE Sensitive     IMIPENEM 1 SENSITIVE Sensitive     PIP/TAZO <=4 SENSITIVE Sensitive     CEFEPIME <=1 SENSITIVE Sensitive     * PSEUDOMONAS AERUGINOSA  Blood Culture ID Panel (Reflexed)     Status: Abnormal   Collection Time: 01/16/17 10:45 PM  Result Value Ref Range Status   Enterococcus species NOT DETECTED NOT DETECTED Final   Listeria monocytogenes NOT DETECTED NOT DETECTED Final  Staphylococcus species NOT DETECTED NOT DETECTED Final   Staphylococcus aureus NOT DETECTED NOT DETECTED Final   Streptococcus species NOT DETECTED NOT DETECTED Final   Streptococcus agalactiae NOT DETECTED NOT DETECTED Final   Streptococcus pneumoniae NOT DETECTED NOT DETECTED Final   Streptococcus pyogenes NOT DETECTED NOT DETECTED Final   Acinetobacter baumannii NOT DETECTED NOT DETECTED Final   Enterobacteriaceae species NOT DETECTED NOT DETECTED Final   Enterobacter cloacae complex NOT DETECTED NOT DETECTED Final   Escherichia coli NOT DETECTED NOT DETECTED Final   Klebsiella oxytoca NOT DETECTED NOT DETECTED Final   Klebsiella pneumoniae NOT DETECTED NOT DETECTED Final   Proteus species NOT DETECTED NOT DETECTED Final   Serratia marcescens NOT DETECTED NOT DETECTED Final   Carbapenem resistance NOT DETECTED NOT DETECTED Final   Haemophilus influenzae NOT DETECTED NOT DETECTED Final   Neisseria meningitidis NOT DETECTED NOT DETECTED Final   Pseudomonas aeruginosa DETECTED (A) NOT DETECTED Final    Comment: CRITICAL RESULT CALLED TO, READ  BACK BY AND VERIFIED WITH: B. HARRIS,RN 0130 01/18/2017 T. TYSOR    Candida albicans NOT DETECTED NOT DETECTED Final   Candida glabrata NOT DETECTED NOT DETECTED Final   Candida krusei NOT DETECTED NOT DETECTED Final   Candida parapsilosis NOT DETECTED NOT DETECTED Final   Candida tropicalis NOT DETECTED NOT DETECTED Final    Comment: Performed at [facility], [address] [place], [place] [address]  Urine culture     Status: None   Collection Time: 01/16/17 11:36 PM  Result Value Ref Range Status   Specimen Description URINE, CLEAN CATCH  Final   Special Requests NONE  Final   Culture   Final    NO GROWTH Performed at [facility], [address], [place], [place] [address]    Report Status 01/18/2017 FINAL  Final     Labs: Basic Metabolic Panel: Recent Labs  Lab 01/16/17 2218 01/18/17 0549  NA 141 140  K 4.2 4.0  CL 103 101  CO2 27 29  GLUCOSE 121* 117*  BUN 12 8  CREATININE 1.31* 1.10  CALCIUM 9.4 9.0   Liver Function Tests: No results for input(s): AST, ALT, ALKPHOS, BILITOT, PROT, ALBUMIN in the last 168 hours. No results for input(s): LIPASE, AMYLASE in the last 168 hours. No results for input(s): AMMONIA in the last 168 hours. CBC: Recent Labs  Lab 01/16/17 2218 01/16/17 2310 01/18/17 0549 01/20/17 0501  WBC 22.2*  --  13.9* 11.0*  NEUTROABS  --  18.9  --   --   HGB 14.3  --  13.1 14.0  HCT 45.4  --  41.7 44.1  MCV 93.8  --  94.1 93.2  PLT 162  --  147* 187   Cardiac Enzymes: Recent Labs  Lab 01/16/17 2218 01/17/17 0636 01/17/17 1128 01/17/17 1727  TROPONINI 0.05* 0.06* 0.06* 0.04*   BNP: BNP (last 3 results) Recent Labs    01/16/17 2218  BNP 29.0    ProBNP (last 3 results) No results for input(s): PROBNP in the last 8760 hours.  CBG: No results for input(s): GLUCAP in the last 168 hours.     Signed:  Chaya Jan  [facility] Pager: [phone] 01/21/2017, 12:50 PM

## 2017-01-21 NOTE — Care Management Important Message (Signed)
Important Message  Patient Details  Name: Jordan Jensen MRN: [id number] Date of Birth: 1970/03/16   Medicare Important Message Given:  Yes    Joselynn Amoroso, Chrystine Oiler, RN 01/21/2017, 8:08 AM

## 2017-01-21 NOTE — Progress Notes (Signed)
Patient discharged home with instructions given on medications,and follow up visits,verbalized understanding. Prescription sent with patient.IV discontinued,catheter intact. Staff to accompany patient to awaiting vehicle.

## 2017-01-23 ENCOUNTER — Other Ambulatory Visit: Payer: Self-pay

## 2017-01-23 NOTE — Progress Notes (Signed)
This encounter was created in error - please disregard.

## 2017-01-23 NOTE — Addendum Note (Signed)
Addended by: Marlow Baars on: 01/23/2017 10:14 AM   Modules accepted: Level of Service, SmartSet

## 2017-01-23 NOTE — Patient Outreach (Addendum)
[facility]) Care Management  01/23/2017  Jordan Jensen August 12, 1970 [id number]    Transition of Care Referral  Referral Date: 01/23/17 Referral Source: [employer] Discharge Report Date of Admission: 01/16/17 Diagnosis: cellulitis Date of Discharge: 01/21/17 [facility]: [facility]  Insurance: [facility]    Outreach attempt # 1 to patient. Spoke with patient and assessment completed. Social: Patient resides primarily in his home alone. He voices that occasionally his dtr and/or girlfriend will stay with him at times. He voices that he is independent with all ADLs/IADLs. He denies any recent falls. DME in the home include shower chair which he voices needing replaced. He will f/u with PCP during next appt about getting DME script. He drives himself to appts.He reports that he works a part time and was told by MD that he could return back to work within a week.   Conditions: Per chart review, patient has PMH of HTN, morbid obesity, sepsis, CHF, bilateral lymphedema and HTN. Patient was recently hospitalized from 01/16/17 to 01/21/17 for cellulitis. He voices that area is healing and looking better. Patient taking antibiotic 2x/day. RN CM instructed patient on importance of completing antibiotic therapy. Patient has history of CHF. He reports occasional bilateral edema but chronic in nature. He denies any worsening symptoms. He voices that he is currently unable to weigh due to no scale and unable to obtain one. RN CM stressed importance of daily weighing and reviewed to properly weigh along with s/s of worsening condition and when to alert MD.   Appointments: Patient has PCP f/u appt on 02/03/17 at 8:30 am.  Advance Directives: Patient was given packet of info while in the hospital from chaplain.He has not had a chance to review info yet but voices he will so.  Consent: [facility] services reviewed with patient and he gave verbal consent for services. Patient agreed to RN CM calling him for  transition of care calls and assisting with obtaining a scale in the home.    Medications: Med review completed with patient.  Encounter Medications:  Outpatient Encounter Medications as of 01/23/2017  Medication Sig  . ciprofloxacin (CIPRO) 250 MG tablet Take 3 tablets (750 mg total) by mouth 2 (two) times daily for 10 days.  . famotidine (PEPCID) 20 MG tablet Take 20 mg by mouth daily as needed for heartburn or indigestion.  . furosemide (LASIX) 40 MG tablet Take 1 tablet (40 mg total) by mouth daily. (Patient taking differently: Take 40 mg by mouth daily as needed for fluid. )  . metoprolol succinate (TOPROL-XL) 25 MG 24 hr tablet Take 0.5 tablets (12.5 mg total) by mouth daily.  Marland Kitchen ibuprofen (ADVIL,MOTRIN) 200 MG tablet Take 400 mg by mouth every 6 (six) hours as needed for mild pain or moderate pain.   No facility-administered encounter medications on file as of 01/23/2017.     Functional Status:  In your present state of health, do you have any difficulty performing the following activities: 01/23/2017 01/17/2017  Hearing? N N  Vision? N N  Difficulty concentrating or making decisions? N N  Walking or climbing stairs? N Y  Dressing or bathing? N N  Doing errands, shopping? N N  Preparing Food and eating ? N -  Using the Toilet? N -  In the past six months, have you accidently leaked urine? N -  Do you have problems with loss of bowel control? N -  Managing your Medications? N -  Managing your Finances? N -  Housekeeping or managing  your Housekeeping? N -  Some recent data might be hidden    Fall/Depression Screening: Fall Risk  01/23/2017 08/15/2013  Falls in the past year? No No   PHQ 2/9 Scores 01/23/2017 09/15/2016 02/13/2016 12/04/2015 10/08/2015 12/08/2014 08/31/2014  PHQ - 2 Score 0 0 0 0 0 0 0  PHQ- 9 Score - - - - - - -    Assessment:  [facility] CM Care Plan Problem One     Most Recent Value  Care Plan Problem One  Patient at high rsk for readmission as evidenced several  co-morbidities  Role Documenting the Problem One  Care Management Telephonic Coordinator  Care Plan for Problem One  Active  [facility] Long Term Goal   Patient will have no readmission to the hospital wiithin the next 31days.  [facility] Long Term Goal Start Date  01/23/17  Interventions for Problem One Long Term Goal  RN CM verified pt has f/u PCP appt, has all meds in the home and pt. aware of s/s of worsening condition and when to seek medical attention.   [facility] CM Short Term Goal #1   Patient will complete PCP f/u appt within two weeks.   [facility] CM Short Term Goal #1 Start Date  01/23/17  Interventions for Short Term Goal #1  RN CM confirmed pt has PCP f/u appt in place as well as transportation.  [facility] CM Short Term Goal #2   Pt will complete antibiotic therapy and have resolution of celluitis wihtin the next 30days as evidenced by no s/s of infection noted/reported.  [facility] CM Short Term Goal #2 Start Date  01/23/17  Interventions for Short Term Goal #2  RN CM educated pt. on importance of completing antibiotics, s/s to monitor for infection and when to seek medical attention.    [facility] CM Care Plan Problem Two     Most Recent Value  Care Plan Problem Two  Knowledge deficit related to CHF as evidence by improper monitoring.  Role Documenting the Problem Two  Care Management Telephonic Coordinator  Care Plan for Problem Two  Active  Interventions for Problem Two Long Term Goal   RN CM will have scale mailed to patient. RN CM instructed patient on importance of weighing and how to properly weight.   [facility] Long Term Goal  Patient will monitor weight consistently at home as evidenced by tracking/recording and logging weigh and reviewing with medical team.  [facility] Long Term Goal Start Date  01/23/17  [facility] CM Short Term Goal #1   Patient will be able to verbalize at least three s/s of worsening condition and possible exacerbation within the next 30days.  Interventions for Short Term Goal #2   RN CM will send EMMI-HF education  materials to patient and continue to proivde ongoing verbal support/education to pt.       Plan:  RN CM will notify [facility] administrative assistant of case status. RN CM will send request for scale to be ordered and delivered to patient.  RN CM will follow up with patient within one week.  RN CM will send successful outreach letter to patient with introductory [facility] welcome package, calendar and HF educational info. RN CM will send PCP barriers letter to patient.  Antionette Fairy, RN,BSN,CCM [facility] Care Management Telephonic Care Management Coordinator Direct Phone: [phone] Toll Free: [phone] Fax: [phone]

## 2017-01-27 ENCOUNTER — Other Ambulatory Visit: Payer: Self-pay | Admitting: Pharmacist

## 2017-01-27 ENCOUNTER — Ambulatory Visit: Payer: Self-pay | Admitting: Pharmacist

## 2017-01-28 ENCOUNTER — Other Ambulatory Visit: Payer: Self-pay | Admitting: Pharmacist

## 2017-01-28 ENCOUNTER — Ambulatory Visit: Payer: Self-pay | Admitting: Pharmacist

## 2017-01-28 NOTE — Patient Outreach (Signed)
[facility]) Care Management  01/28/2017  Jordan Jensen [birth year] [id number]   Patient was called for post discharge medication review. Unfortunately, he did not answer the phone.  HIPAA compliant message left on the patient's voicemail.   Plan:  Call patient back in 3-5 business days.   [name], PharmD, BCACP [facility] Clinical Pharmacist [phone]

## 2017-01-30 ENCOUNTER — Other Ambulatory Visit: Payer: Self-pay

## 2017-01-30 NOTE — Patient Outreach (Signed)
[facility]) Care Management  01/30/2017  Jordan Jensen 1970-05-23 [id number]   Transition of Care Week #2    Outreach attempt #1 to patient. No answer at present. RN CM left HIPAA compliant voicemail message along with contact info.     Plan: RN CM will make outreach attempt to patient within three business days if no return call from patient.  Antionette Fairy, RN,BSN,CCM [facility] Care Management Telephonic Care Management Coordinator Direct Phone: [phone] Toll Free: [phone] Fax: [phone]

## 2017-02-03 ENCOUNTER — Other Ambulatory Visit: Payer: Self-pay

## 2017-02-03 ENCOUNTER — Inpatient Hospital Stay: Payer: Medicare HMO | Admitting: Family Medicine

## 2017-02-03 NOTE — Patient Outreach (Signed)
[facility]) Care Management  02/03/2017  Jordan Jensen 1970-11-24 [id number]   Transition of Care   Follow Up Call       Outreach attempt #2 to patient. No answer at present. RN CM left HIPAA compliant voicemail message along with contact info.     Plan: RN CM will make outreach attempt to patient within three business days if no return call.    Antionette Fairy, RN,BSN,CCM [facility] Care Management Telephonic Care Management Coordinator Direct Phone: [phone] Toll Free: [phone] Fax: [phone]

## 2017-02-04 ENCOUNTER — Other Ambulatory Visit: Payer: Self-pay | Admitting: Pharmacist

## 2017-02-04 DIAGNOSIS — Z87891 Personal history of nicotine dependence: Secondary | ICD-10-CM | POA: Diagnosis not present

## 2017-02-04 DIAGNOSIS — K219 Gastro-esophageal reflux disease without esophagitis: Secondary | ICD-10-CM | POA: Diagnosis not present

## 2017-02-04 DIAGNOSIS — Z79899 Other long term (current) drug therapy: Secondary | ICD-10-CM | POA: Diagnosis not present

## 2017-02-04 DIAGNOSIS — I110 Hypertensive heart disease with heart failure: Secondary | ICD-10-CM | POA: Diagnosis not present

## 2017-02-04 DIAGNOSIS — I509 Heart failure, unspecified: Secondary | ICD-10-CM | POA: Diagnosis not present

## 2017-02-04 DIAGNOSIS — R05 Cough: Secondary | ICD-10-CM | POA: Diagnosis not present

## 2017-02-04 DIAGNOSIS — J069 Acute upper respiratory infection, unspecified: Secondary | ICD-10-CM | POA: Diagnosis not present

## 2017-02-04 NOTE — Patient Outreach (Signed)
[facility]) Care Management  [facility] CM Pharmacy   02/04/2017  VALIANT SINGLER 06/22/1970 [id number]  Subjective: Patient was called regarding post discharge medication review. HIPAA identifiers were obtained.  Patient is a 47 year old male with multiple medical conditions including but not limited to:  Morbid obestiy, quadriplegia, lymphedema,  chronic low back pain, hypertension, and CHF.  Patient was hospitalized 01/16/17-01/21/17 for cellulitis and sepsis.  Patient reported managing his medications on his own without difficulty.  Objective:   Encounter Medications: Outpatient Encounter Medications as of 02/04/2017  Medication Sig  . famotidine (PEPCID) 20 MG tablet Take 20 mg by mouth daily as needed for heartburn or indigestion.  . metoprolol succinate (TOPROL-XL) 25 MG 24 hr tablet Take 0.5 tablets (12.5 mg total) by mouth daily.  . traMADol (ULTRAM) 50 MG tablet Take 50 mg by mouth daily.  . furosemide (LASIX) 40 MG tablet Take 1 tablet (40 mg total) by mouth daily. (Patient taking differently: Take 40 mg by mouth daily as needed for fluid. )  . [DISCONTINUED] ibuprofen (ADVIL,MOTRIN) 200 MG tablet Take 400 mg by mouth every 6 (six) hours as needed for mild pain or moderate pain.   No facility-administered encounter medications on file as of 02/04/2017.     Functional Status: In your present state of health, do you have any difficulty performing the following activities: 01/23/2017 01/17/2017  Hearing? N N  Vision? N N  Difficulty concentrating or making decisions? N N  Walking or climbing stairs? N Y  Dressing or bathing? N N  Doing errands, shopping? N N  Preparing Food and eating ? N -  Using the Toilet? N -  In the past six months, have you accidently leaked urine? N -  Do you have problems with loss of bowel control? N -  Managing your Medications? N -  Managing your Finances? N -  Housekeeping or managing your Housekeeping? N -  Some recent data might be  hidden    Fall/Depression Screening: Fall Risk  01/23/2017 08/15/2013  Falls in the past year? No No   PHQ 2/9 Scores 01/23/2017 09/15/2016 02/13/2016 12/04/2015 10/08/2015 12/08/2014 08/31/2014  PHQ - 2 Score 0 0 0 0 0 0 0  PHQ- 9 Score - - - - - - -      Assessment: ASSESSMENT: Date Discharged from Hospital: 01/21/17 Date Medication Reconciliation Performed: 02/04/2017   Medications Discontinued at Discharge:  (none)  New Medications at Discharge: (delete if applicable) Ciprofloxacin 750mg  twice daily  Patient was recently discharged from hospital and all medications have been reviewed   Drugs sorted by system:   Cardiovascular: Furosemide (patient said taking PRN vs daily) Metoprolol   Gastrointestinal: Famotidine  Pain: Tramadol   Ciprofloxacin-patient reported completing dose.  PLAN: Close patient's case as he did not have any medication issues, questions or concerns.  [facility] Telephonic nursing is still active with the patient.  [name], PharmD, BCACP [facility] Clinical Pharmacist [phone]

## 2017-02-06 ENCOUNTER — Other Ambulatory Visit: Payer: Self-pay

## 2017-02-06 NOTE — Patient Outreach (Addendum)
[facility]) Care Management  02/06/2017  Jordan Jensen June 27, 1970 [id number]   Transition of Care Week #2     Outreach attempt #3 to patient. Spoke with patient. He voices he is doing fairly well. He states that he is still in the bed at present. He reports that he did receive scale that RN CM shipped out to him. He has been monitoring weight and reports weight is staying about the same. He continues to have occasional swelling in left leg but voices symptoms have not worsened. He is using compression stockings. RN CM also encouraged patient to elevate affected extremity as much as possible as well. Patient voices that he has not received packet mailed out to him with educational info and calendar yet. He has completed antibiotic therapy for cellulitis. He states that he was running late and missed PCP follow up appt. He voices that he notified MD office while in route and tried to reschedule an appt. He states he was told office was booked for the remainder of the month. He did advise that this was a post hospital follow up appt but there were still no openings until March. Patient aware to seek medical attention for changes or worsening symptoms. Patient overall doing fairly well. He denies any needs or concerns at this time. He has RN CM contact info if needed.        Plan: RN CM will make outreach call to patient within one week for transition of care call.    Antionette Fairy, RN,BSN,CCM [facility] Care Management Telephonic Care Management Coordinator Direct Phone: [phone] Toll Free: [phone] Fax: [phone]

## 2017-02-08 ENCOUNTER — Other Ambulatory Visit: Payer: Self-pay

## 2017-02-08 ENCOUNTER — Encounter (HOSPITAL_COMMUNITY): Payer: Self-pay | Admitting: Emergency Medicine

## 2017-02-08 DIAGNOSIS — Z6841 Body Mass Index (BMI) 40.0 and over, adult: Secondary | ICD-10-CM | POA: Diagnosis not present

## 2017-02-08 DIAGNOSIS — A419 Sepsis, unspecified organism: Secondary | ICD-10-CM | POA: Diagnosis not present

## 2017-02-08 DIAGNOSIS — Z981 Arthrodesis status: Secondary | ICD-10-CM

## 2017-02-08 DIAGNOSIS — Z87891 Personal history of nicotine dependence: Secondary | ICD-10-CM | POA: Diagnosis not present

## 2017-02-08 DIAGNOSIS — R102 Pelvic and perineal pain: Secondary | ICD-10-CM | POA: Diagnosis not present

## 2017-02-08 DIAGNOSIS — Z96649 Presence of unspecified artificial hip joint: Secondary | ICD-10-CM | POA: Diagnosis present

## 2017-02-08 DIAGNOSIS — I110 Hypertensive heart disease with heart failure: Secondary | ICD-10-CM | POA: Diagnosis present

## 2017-02-08 DIAGNOSIS — G8250 Quadriplegia, unspecified: Secondary | ICD-10-CM | POA: Diagnosis present

## 2017-02-08 DIAGNOSIS — I5022 Chronic systolic (congestive) heart failure: Secondary | ICD-10-CM | POA: Diagnosis present

## 2017-02-08 DIAGNOSIS — J069 Acute upper respiratory infection, unspecified: Secondary | ICD-10-CM | POA: Diagnosis not present

## 2017-02-08 DIAGNOSIS — K610 Anal abscess: Secondary | ICD-10-CM | POA: Diagnosis present

## 2017-02-08 MED ORDER — ACETAMINOPHEN 325 MG PO TABS
650.0000 mg | ORAL_TABLET | Freq: Once | ORAL | Status: AC | PRN
  Administered 2017-02-08: 650 mg via ORAL
  Filled 2017-02-08: qty 2

## 2017-02-08 NOTE — ED Triage Notes (Signed)
Reports URI symptoms for over a week.  Seen at [facility] hospital for that.  Reports not receiving any medication.  Noted to have fever in triage of 100.2.  Here due to pain to left buttock with green drainage.  This started yesterday.  Has not taken anything for pain.

## 2017-02-09 ENCOUNTER — Emergency Department (HOSPITAL_COMMUNITY): Payer: Medicare HMO

## 2017-02-09 ENCOUNTER — Inpatient Hospital Stay (HOSPITAL_COMMUNITY)
Admission: EM | Admit: 2017-02-09 | Discharge: 2017-02-11 | DRG: 347 | Disposition: A | Discharge status: Home / Self Care | Payer: Medicare HMO | Attending: Nephrology | Admitting: Nephrology

## 2017-02-09 DIAGNOSIS — Z96649 Presence of unspecified artificial hip joint: Secondary | ICD-10-CM | POA: Diagnosis present

## 2017-02-09 DIAGNOSIS — K610 Anal abscess: Secondary | ICD-10-CM | POA: Diagnosis present

## 2017-02-09 DIAGNOSIS — Z87891 Personal history of nicotine dependence: Secondary | ICD-10-CM | POA: Diagnosis not present

## 2017-02-09 DIAGNOSIS — I5022 Chronic systolic (congestive) heart failure: Secondary | ICD-10-CM | POA: Diagnosis present

## 2017-02-09 DIAGNOSIS — G8250 Quadriplegia, unspecified: Secondary | ICD-10-CM | POA: Diagnosis present

## 2017-02-09 DIAGNOSIS — J069 Acute upper respiratory infection, unspecified: Secondary | ICD-10-CM

## 2017-02-09 DIAGNOSIS — I509 Heart failure, unspecified: Secondary | ICD-10-CM

## 2017-02-09 DIAGNOSIS — S14105A Unspecified injury at C5 level of cervical spinal cord, initial encounter: Secondary | ICD-10-CM | POA: Diagnosis present

## 2017-02-09 DIAGNOSIS — I110 Hypertensive heart disease with heart failure: Secondary | ICD-10-CM | POA: Diagnosis present

## 2017-02-09 DIAGNOSIS — Z981 Arthrodesis status: Secondary | ICD-10-CM | POA: Diagnosis not present

## 2017-02-09 DIAGNOSIS — Z6841 Body Mass Index (BMI) 40.0 and over, adult: Secondary | ICD-10-CM | POA: Diagnosis not present

## 2017-02-09 LAB — URINALYSIS, ROUTINE W REFLEX MICROSCOPIC
Bilirubin Urine: NEGATIVE
Glucose, UA: NEGATIVE mg/dL
Hgb urine dipstick: NEGATIVE
Ketones, ur: NEGATIVE mg/dL
Leukocytes, UA: NEGATIVE
Nitrite: NEGATIVE
Protein, ur: NEGATIVE mg/dL
Specific Gravity, Urine: 1.043 — ABNORMAL HIGH (ref 1.005–1.030)
pH: 5 (ref 5.0–8.0)

## 2017-02-09 LAB — BASIC METABOLIC PANEL
ANION GAP: 15 (ref 5–15)
BUN: 8 mg/dL (ref 6–20)
CALCIUM: 9 mg/dL (ref 8.9–10.3)
CO2: 24 mmol/L (ref 22–32)
CREATININE: 1.12 mg/dL (ref 0.61–1.24)
Chloride: 97 mmol/L — ABNORMAL LOW (ref 101–111)
GFR calc Af Amer: >60 mL/min (ref >60)
GFR calc non Af Amer: >60 mL/min (ref >60)
GLUCOSE: 109 mg/dL — AB (ref 65–99)
Potassium: 4 mmol/L (ref 3.5–5.1)
Sodium: 136 mmol/L (ref 135–145)

## 2017-02-09 LAB — CBC WITH DIFFERENTIAL/PLATELET
Basophils Absolute: 0 10*3/uL (ref 0.0–0.1)
Basophils Relative: 0 %
Eosinophils Absolute: 0.1 10*3/uL (ref 0.0–0.7)
Eosinophils Relative: 1 %
HEMATOCRIT: 38 % — AB (ref 39.0–52.0)
Hemoglobin: 12.2 g/dL — ABNORMAL LOW (ref 13.0–17.0)
LYMPHS PCT: 13 %
Lymphs Abs: 2.3 10*3/uL (ref 0.7–4.0)
MCH: 29.6 pg (ref 26.0–34.0)
MCHC: 32.1 g/dL (ref 30.0–36.0)
MCV: 92.2 fL (ref 78.0–100.0)
MONO ABS: 0.9 10*3/uL (ref 0.1–1.0)
MONOS PCT: 5 %
NEUTROS ABS: 15 10*3/uL — AB (ref 1.7–7.7)
Neutrophils Relative %: 81 %
Platelets: 204 10*3/uL (ref 150–400)
RBC: 4.12 MIL/uL — ABNORMAL LOW (ref 4.22–5.81)
RDW: 13.2 % (ref 11.5–15.5)
WBC: 18.3 10*3/uL — ABNORMAL HIGH (ref 4.0–10.5)

## 2017-02-09 LAB — I-STAT CG4 LACTIC ACID, ED
LACTIC ACID, VENOUS: 1.02 mmol/L (ref 0.5–1.9)
Lactic Acid, Venous: 1.08 mmol/L (ref 0.5–1.9)

## 2017-02-09 LAB — HEPATIC FUNCTION PANEL
ALT: 23 U/L (ref 17–63)
AST: 29 U/L (ref 15–41)
Albumin: 3.2 g/dL — ABNORMAL LOW (ref 3.5–5.0)
Alkaline Phosphatase: 50 U/L (ref 38–126)
Bilirubin, Direct: 0.3 mg/dL (ref 0.1–0.5)
Indirect Bilirubin: 0.9 mg/dL (ref 0.3–0.9)
Total Bilirubin: 1.2 mg/dL (ref 0.3–1.2)
Total Protein: 7.8 g/dL (ref 6.5–8.1)

## 2017-02-09 MED ORDER — VANCOMYCIN HCL 10 G IV SOLR
1250.0000 mg | Freq: Two times a day (BID) | INTRAVENOUS | Status: DC
  Administered 2017-02-09 – 2017-02-10 (×3): 1250 mg via INTRAVENOUS
  Filled 2017-02-09 (×5): qty 1250

## 2017-02-09 MED ORDER — HYDROMORPHONE HCL 1 MG/ML IJ SOLN
1.0000 mg | Freq: Once | INTRAMUSCULAR | Status: AC
  Administered 2017-02-09: 1 mg via INTRAMUSCULAR

## 2017-02-09 MED ORDER — SODIUM CHLORIDE 0.9 % IV BOLUS (SEPSIS)
1000.0000 mL | Freq: Once | INTRAVENOUS | Status: AC
  Administered 2017-02-09: 1000 mL via INTRAVENOUS

## 2017-02-09 MED ORDER — ACETAMINOPHEN 325 MG PO TABS: 650.0000 mg | ORAL_TABLET | Freq: Four times a day (QID) | ORAL | Status: DC | PRN

## 2017-02-09 MED ORDER — VANCOMYCIN HCL IN DEXTROSE 1-5 GM/200ML-% IV SOLN: 1000.0000 mg | Freq: Once | INTRAVENOUS | Status: DC

## 2017-02-09 MED ORDER — IOPAMIDOL (ISOVUE-300) INJECTION 61%
INTRAVENOUS | Status: AC
  Administered 2017-02-09: 75 mL via INTRAVENOUS
  Filled 2017-02-09: qty 75

## 2017-02-09 MED ORDER — LIDOCAINE-EPINEPHRINE (PF) 2 %-1:200000 IJ SOLN
10.0000 mL | Freq: Once | INTRAMUSCULAR | Status: AC
  Administered 2017-02-09: 10 mL
  Filled 2017-02-09: qty 20

## 2017-02-09 MED ORDER — OXYCODONE HCL 5 MG PO TABS
5.0000 mg | ORAL_TABLET | ORAL | Status: DC | PRN
  Administered 2017-02-09 – 2017-02-11 (×4): 5 mg via ORAL
  Filled 2017-02-09 (×4): qty 1

## 2017-02-09 MED ORDER — HYDROCODONE-ACETAMINOPHEN 5-325 MG PO TABS: 1.0000 | ORAL_TABLET | Freq: Four times a day (QID) | ORAL | 0 refills | Status: DC | PRN

## 2017-02-09 MED ORDER — TRAMADOL HCL 50 MG PO TABS: 50.0000 mg | ORAL_TABLET | Freq: Four times a day (QID) | ORAL | Status: DC | PRN

## 2017-02-09 MED ORDER — ONDANSETRON HCL 4 MG/2ML IJ SOLN: 4.0000 mg | Freq: Four times a day (QID) | INTRAMUSCULAR | Status: DC | PRN

## 2017-02-09 MED ORDER — FUROSEMIDE 20 MG PO TABS: 40.0000 mg | ORAL_TABLET | Freq: Every day | ORAL | Status: DC | PRN

## 2017-02-09 MED ORDER — ONDANSETRON HCL 4 MG PO TABS: 4.0000 mg | ORAL_TABLET | Freq: Four times a day (QID) | ORAL | Status: DC | PRN

## 2017-02-09 MED ORDER — HYDROMORPHONE HCL 1 MG/ML IJ SOLN
1.0000 mg | Freq: Once | INTRAMUSCULAR | Status: DC
Start: 2017-02-09 — End: 2017-02-09
  Filled 2017-02-09: qty 1

## 2017-02-09 MED ORDER — ACETAMINOPHEN 325 MG PO TABS
650.0000 mg | ORAL_TABLET | Freq: Once | ORAL | Status: AC | PRN
  Administered 2017-02-09: 650 mg via ORAL
  Filled 2017-02-09: qty 2

## 2017-02-09 MED ORDER — PIPERACILLIN-TAZOBACTAM 3.375 G IVPB
3.3750 g | Freq: Three times a day (TID) | INTRAVENOUS | Status: DC
  Administered 2017-02-09 – 2017-02-11 (×6): 3.375 g via INTRAVENOUS
  Filled 2017-02-09 (×8): qty 50

## 2017-02-09 MED ORDER — ACETAMINOPHEN 650 MG RE SUPP: 650.0000 mg | Freq: Four times a day (QID) | RECTAL | Status: DC | PRN

## 2017-02-09 MED ORDER — SODIUM CHLORIDE 0.9 % IV BOLUS (SEPSIS)
1000.0000 mL | Freq: Once | INTRAVENOUS | Status: AC
Start: 2017-02-09 — End: 2017-02-09
  Administered 2017-02-09: 1000 mL via INTRAVENOUS

## 2017-02-09 MED ORDER — SODIUM CHLORIDE 0.9 % IV SOLN
2500.0000 mg | Freq: Once | INTRAVENOUS | Status: AC
  Administered 2017-02-09: 2500 mg via INTRAVENOUS
  Filled 2017-02-09: qty 2500

## 2017-02-09 MED ORDER — METOPROLOL SUCCINATE ER 25 MG PO TB24
12.5000 mg | ORAL_TABLET | Freq: Every day | ORAL | Status: DC
  Administered 2017-02-09 – 2017-02-11 (×3): 12.5 mg via ORAL
  Filled 2017-02-09 (×4): qty 1

## 2017-02-09 MED ORDER — SENNOSIDES-DOCUSATE SODIUM 8.6-50 MG PO TABS: 1.0000 | ORAL_TABLET | Freq: Every evening | ORAL | Status: DC | PRN

## 2017-02-09 MED ORDER — ENOXAPARIN SODIUM 40 MG/0.4ML ~~LOC~~ SOLN: 40.0000 mg | SUBCUTANEOUS | Status: DC

## 2017-02-09 MED ORDER — LIDOCAINE-EPINEPHRINE (PF) 2 %-1:200000 IJ SOLN
10.0000 mL | Freq: Once | INTRAMUSCULAR | Status: AC
  Administered 2017-02-09: 10 mL via INTRADERMAL
  Filled 2017-02-09: qty 20

## 2017-02-09 MED ORDER — PIPERACILLIN-TAZOBACTAM 3.375 G IVPB 30 MIN
3.3750 g | Freq: Once | INTRAVENOUS | Status: AC
  Administered 2017-02-09: 3.375 g via INTRAVENOUS
  Filled 2017-02-09: qty 50

## 2017-02-09 MED ORDER — FAMOTIDINE 20 MG PO TABS: 20.0000 mg | ORAL_TABLET | Freq: Every day | ORAL | Status: DC | PRN

## 2017-02-09 MED ORDER — DOXYCYCLINE HYCLATE 100 MG PO CAPS: 100.0000 mg | ORAL_CAPSULE | Freq: Two times a day (BID) | ORAL | 0 refills | Status: DC

## 2017-02-09 MED ORDER — SODIUM CHLORIDE 0.9 % IV SOLN
INTRAVENOUS | Status: DC
  Administered 2017-02-09: 15:00:00 via INTRAVENOUS

## 2017-02-09 NOTE — ED Notes (Signed)
Spoke with pt regarding wait for treatment room.
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
## 2017-02-09 NOTE — ED Notes (Signed)
IV team at bedside 
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
## 2017-02-09 NOTE — Procedures (Signed)
Incision and Drainage Procedure Note  Diagnosis: Perianal Abscess  Provider: Mattie Marlin, PA-C, Lynden Oxford, PA-S  Anesthesia: 1% lidocaine with epinephrine  Procedure Details  The procedure, risks and complications have been discussed in detail (including, but not limited to airway compromise, infection, bleeding) with the patient, and the patient has signed consent to the procedure.  The skin was sterilely prepped and draped over the affected area in the usual fashion. After adequate local anesthesia, I&D with a #11 blade was performed on the right gluteal cleft. Purulent drainage: present. The wound was packed with iodoform and covered with an AB pad.   The patient tolerated the procedure well without complication.  Condition: Tolerated procedure well  Complications: none.  Signed: Lynden Oxford , PA-S [facility] Surgery 02/09/2017, 3:52 PM Pager: [phone] Trauma Pager: [phone] Mon-Fri 7:00 am-4:30 pm Sat-Sun 7:00 am-11:30 am

## 2017-02-09 NOTE — ED Notes (Signed)
Attempted Second IV and Blood Culture draw with no success. Phlebotomy attempted and unable to access.

## 2017-02-09 NOTE — ED Notes (Signed)
Pt returned from CT. Phlebotomy at the bedside attempting to get labs.

## 2017-02-09 NOTE — ED Provider Notes (Addendum)
I assumed care of patient from [employer] PA-C, please see his note for full H&P.  In brief patient is a 47 year old male with a history of spinal cord injury, morbid obesity weighing approximately 400 pounds, recurrent cellulitis of left lower leg, hypertension, who presents today for evaluation of pain and swelling near his rectum.  This was incised and drained by [employer] PA-C.  Plan to follow-up on CT scan, if normal can discharge home.  While awaiting CT scan results patient was noted to be febrile, tachypneic with with a respiratory rate of 36, and blood pressures slowly downtrending throughout his time here at from approximately 110 systolic down to 93 systolic.  Code sepsis was called, and orders for broad-spectrum antibiotics were placed.  2 liter fluid bolus was placed.  Patient not given full 30/kg fluid bolus as he was rapidly fluid responsive and full bolus would be 5,500 ml.  CT scan results reviewed, unsure if there is still abscess or if the inflammatory changes are secondary to infection with recent I&D.  Will admit to hospitalist for further management.  CRITICAL CARE Performed by: Lyndel Safe Total critical care time: 37 minutes Critical care time was exclusive of separately billable procedures and treating other patients. Critical care was necessary to treat or prevent imminent or life-threatening deterioration. Critical care was time spent personally by me on the following activities: development of treatment plan with patient and/or surrogate as well as nursing, discussions with consultants, evaluation of patient's response to treatment, examination of patient, obtaining history from patient or surrogate, ordering and performing treatments and interventions, ordering and review of laboratory studies, ordering and review of radiographic studies, pulse oximetry and re-evaluation of patient's condition.  Sepsis requiring multiple antibiotics, admission.     Clinical Course  as of Feb 09 1000  Mon Feb 09, 2017  0748 Temp: (!) 100.9 F (38.3 C) [EH]  0748 Resp: (!) 36 [EH]  0748 BP down trending, from 110 to 93 systolic.  Given increase Rr, fever, code sepsis called, started on broad spectrum.  Patient in CT scan.   [EH]  [phone] Spoke with hospitalist who will admit the patient.   [EH]    Clinical Course User Index [EH] Skip Mayer 02/09/17 1003    Raeford Razor, MD 02/09/17 1056    [address], PA-C 02/09/17 1709    Raeford Razor, MD 02/10/17 [phone]

## 2017-02-09 NOTE — ED Notes (Signed)
Admitting Team at the bedside 
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
## 2017-02-09 NOTE — Progress Notes (Signed)
Pharmacy Antibiotic Note  Jordan Jensen is a 47 y.o. male admitted on 02/09/2017 with sepsis.  Pharmacy has been consulted for vancomycin and Zosyn dosing. WBC 18.3, Tmax 100.9, normalized CrCl ~86 mL/min  Plan: Vancomycin 2.5G IV once then 1250mg  IV every 12 hours.  Goal trough 15-20 mcg/mL. Zosyn 3.375g IV q8h (4 hour infusion).  Height: 5\' 11"  (180.3 cm) Weight: (!) 400 lb (181.4 kg) IBW/kg (Calculated) : 75.3  Temp (24hrs), Avg:100.5 F (38.1 C), Min:99.8 F (37.7 C), Max:100.9 F (38.3 C)  Recent Labs  Lab 02/09/17 0645  WBC 18.3*  CREATININE 1.12    Estimated Creatinine Clearance: 135.7 mL/min (by C-G formula based on SCr of 1.12 mg/dL).    Allergies  Allergen Reactions  . Ace Inhibitors Cough  . Latex Itching and Rash    cellulitis   Thank you for allowing pharmacy to be a part of this patient's care.  Toniann Fail Olufemi Mofield 02/09/2017 7:54 AM

## 2017-02-09 NOTE — ED Notes (Signed)
Pt remains in waiting room. Updated on wait for treatment room. 

## 2017-02-09 NOTE — ED Notes (Signed)
Pt is gone to CT at this time

## 2017-02-09 NOTE — ED Notes (Signed)
IV Team at the bedside. 

## 2017-02-10 ENCOUNTER — Other Ambulatory Visit: Payer: Self-pay | Admitting: *Deleted

## 2017-02-10 ENCOUNTER — Other Ambulatory Visit: Payer: Self-pay

## 2017-02-10 DIAGNOSIS — K610 Anal abscess: Principal | ICD-10-CM

## 2017-02-10 LAB — CBC
HCT: 34.4 % — ABNORMAL LOW (ref 39.0–52.0)
HEMOGLOBIN: 11.1 g/dL — AB (ref 13.0–17.0)
MCH: 30.1 pg (ref 26.0–34.0)
MCHC: 32.3 g/dL (ref 30.0–36.0)
MCV: 93.2 fL (ref 78.0–100.0)
Platelets: 205 10*3/uL (ref 150–400)
RBC: 3.69 MIL/uL — AB (ref 4.22–5.81)
RDW: 13.5 % (ref 11.5–15.5)
WBC: 11.7 10*3/uL — ABNORMAL HIGH (ref 4.0–10.5)

## 2017-02-10 LAB — BASIC METABOLIC PANEL
ANION GAP: 10 (ref 5–15)
BUN: 7 mg/dL (ref 6–20)
CALCIUM: 8.6 mg/dL — AB (ref 8.9–10.3)
CO2: 27 mmol/L (ref 22–32)
CREATININE: 1.01 mg/dL (ref 0.61–1.24)
Chloride: 100 mmol/L — ABNORMAL LOW (ref 101–111)
GFR calc non Af Amer: >60 mL/min (ref >60)
Glucose, Bld: 124 mg/dL — ABNORMAL HIGH (ref 65–99)
Potassium: 4.1 mmol/L (ref 3.5–5.1)
Sodium: 137 mmol/L (ref 135–145)

## 2017-02-10 LAB — GLUCOSE, CAPILLARY: GLUCOSE-CAPILLARY: 99 mg/dL (ref 65–99)

## 2017-02-10 NOTE — Progress Notes (Signed)
[facility]/Trauma Progress Note      Assessment/Plan Principal Problem:   Anal abscess Active Problems:   Morbid obesity (HCC)   CHF NYHA class III (H[facility])   Spinal cord injury at C5-C7 level without injury of spinal bone (H[facility])  Anal abscess - repeat bedside I&D with iodoform packing, 02/04 - leave packing in until 02/07 - change outer dressing as needed as it gets soiled.     LOS: 1 day    Subjective: CC: anal abscess  Pt states he is feeling better and having less pain. Outer dressing has not been changed yet.   Objective: Vital signs in last 24 hours: Temp:  [98.2 F (36.8 C)-101.9 F (38.8 C)] 98.4 F (36.9 C) (02/05 0539) Pulse Rate:  [60-109] 83 (02/05 0539) Resp:  [12-36] 19 (02/05 0539) BP: (104-146)/(43-91) 104/64 (02/05 0539) SpO2:  [88 %-98 %] 94 % (02/05 0539) Weight:  [399 lb 4.1 oz (181.1 kg)-400 lb (181.4 kg)] 399 lb 4.1 oz (181.1 kg) (02/05 0500) Last BM Date: 02/08/17  Intake/Output from previous day: 02/04 0701 - 02/05 0700 In: 3085 [P.O.:360; I.V.:1375; IV Piggyback:1350] Out: 1300 [Urine:1300] Intake/Output this shift: No intake/output data recorded.  PE: Gen:  Alert, NAD, pleasant, cooperative Pulm:  Rate and effort normal GU: anal wound with wick in place, some bloody purulent drainage with dressing change, very minimal pain.   Anti-infectives: Anti-infectives (From admission, onward)   Start     Dose/Rate Route Frequency Ordered Stop   02/09/17 2000  vancomycin (VANCOCIN) 1,250 mg in sodium chloride 0.9 % 250 mL IVPB     1,250 mg 166.7 mL/hr over 90 Minutes Intravenous Every 12 hours 02/09/17 0754     02/09/17 1400  piperacillin-tazobactam (ZOSYN) IVPB 3.375 g     3.375 g 12.5 mL/hr over 240 Minutes Intravenous Every 8 hours 02/09/17 0754     02/09/17 0800  piperacillin-tazobactam (ZOSYN) IVPB 3.375 g     3.375 g 100 mL/hr over 30 Minutes Intravenous  Once 02/09/17 0745 02/09/17 0916   02/09/17 0800  vancomycin  (VANCOCIN) IVPB 1000 mg/200 mL premix  Status:  Discontinued     1,000 mg 200 mL/hr over 60 Minutes Intravenous  Once 02/09/17 0745 02/09/17 0748   02/09/17 0800  vancomycin (VANCOCIN) 2,500 mg in sodium chloride 0.9 % 500 mL IVPB     2,500 mg 250 mL/hr over 120 Minutes Intravenous  Once 02/09/17 0748 02/09/17 1147   02/09/17 0000  doxycycline (VIBRAMYCIN) 100 MG capsule     100 mg Oral 2 times daily 02/09/17 0623        Lab Results:  Recent Labs    02/09/17 0645 02/10/17 0706  WBC 18.3* PENDING  HGB 12.2* 11.1*  HCT 38.0* 34.4*  PLT 204 205   BMET Recent Labs    02/09/17 0645  NA 136  K 4.0  CL 97*  CO2 24  GLUCOSE 109*  BUN 8  CREATININE 1.12  CALCIUM 9.0   PT/INR No results for input(s): LABPROT, INR in the last 72 hours. CMP     Component Value Date/Time   NA 136 02/09/2017 0645   K 4.0 02/09/2017 0645   CL 97 (L) 02/09/2017 0645   CO2 24 02/09/2017 0645   GLUCOSE 109 (H) 02/09/2017 0645   BUN 8 02/09/2017 0645   CREATININE 1.12 02/09/2017 0645   CREATININE 1.04 10/08/2015 1544   CALCIUM 9.0 02/09/2017 0645   PROT 7.8 02/09/2017 0645   ALBUMIN 3.2 (L) 02/09/2017 0645  AST 29 02/09/2017 0645   ALT 23 02/09/2017 0645   ALKPHOS 50 02/09/2017 0645   BILITOT 1.2 02/09/2017 0645   GFRNONAA >60 02/09/2017 0645   GFRNONAA 86 10/08/2015 1544   GFRAA >60 02/09/2017 0645   GFRAA >89 10/08/2015 1544   Lipase  No results found for: LIPASE  Studies/Results: Ct Pelvis W Contrast  Result Date: 02/09/2017 CLINICAL DATA:  47 year old male wheelchair-bound since 1993. Perianal pain when sitting. Purulent drainage left buttock. Initial encounter. EXAM: CT PELVIS WITH CONTRAST TECHNIQUE: Multidetector CT imaging of the pelvis was performed using the standard protocol following the bolus administration of intravenous contrast. CONTRAST:  <See Chart> ISOVUE-300 IOPAMIDOL (ISOVUE-300) INJECTION 61% COMPARISON:  09/05/2016 CT. FINDINGS: Urinary Tract: Partially contrast  filled urinary bladder without gross abnormality noted. Bowel: Inflammatory process extends from the posterior aspect of the rectum to the anal crease with suggestion of 2.3 x 2.6 x 2.8 cm abscess within the central aspect of the inflammatory process. Vascular/Lymphatic: Enlarged left inguinal and left external iliac/common iliac lymph nodes possibly reactive in origin. Atherosclerotic changes femoral arteries and iliac arteries. Reproductive:  Negative Other: Fat and vessel containing inguinal hernias greater on the right. Musculoskeletal: Multifactorial spinal stenosis lower lumbar region. Bile hip joint degenerative changes. IMPRESSION: Inflammatory process extends from the posterior aspect of the rectum to the anal crease with suggestion of 2.3 x 2.6 x 2.8 cm abscess within the central aspect of the inflammatory process. Enlarged left inguinal and left external iliac/common iliac lymph nodes possibly reactive in origin. Atherosclerotic changes femoral arteries and iliac arteries. Bilateral fat and vessel containing inguinal hernias greater on the right. Electronically Signed   By: Lacy Duverney M.D.   On: 02/09/2017 08:23   Dg Chest Port 1 View  Result Date: 02/09/2017 CLINICAL DATA:  47 year old male with history of sepsis. EXAM: PORTABLE CHEST 1 VIEW COMPARISON:  Chest x-ray 02/04/2017. FINDINGS: Lung volumes are normal. No consolidative airspace disease. No pleural effusions. No evidence of pulmonary edema. Heart size appears mildly enlarged. Upper mediastinal contours are within normal limits. IMPRESSION: 1. No radiographic evidence of acute cardiopulmonary disease. 2. Mild cardiomegaly. Electronically Signed   By: Trudie Reed M.D.   On: 02/09/2017 08:51      Jerre Simon , [facility] 02/10/2017, 8:50 AM Pager: [phone] Consults: [phone] Mon-Fri 7:00 am-4:30 pm Sat-Sun 7:00 am-11:30 am
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
## 2017-02-10 NOTE — Patient Outreach (Signed)
[facility]) Care Management  02/10/2017  DEMARCO LODUCA 21-Feb-1970 [id number]   Transition of Care     Patient currently being followed in [facility] program. RN CM notified that patient readmitted to hospital on 02/09/17.      Plan: RN CM will notify [facility] hospital liaisons of admission status. RN CM will continue to monitor patient's inpatient status.   Antionette Fairy, RN,BSN,CCM [facility] Care Management Telephonic Care Management Coordinator Direct Phone: [phone] Toll Free: [phone] Fax: [phone]

## 2017-02-10 NOTE — Consult Note (Signed)
   [facility] CM Inpatient Consult   02/10/2017  Jordan Jensen 12-26-1970 [id number]   Made aware of hospitalization by [name]. Jordan Jensen is active with [name].   Spoke with Jordan Jensen at bedside. He is agreeable to [name] follow up. Denies the need for [name] referral at this time. [facility] Care Management written consent obtained for continued services.   [name] provided along with 24-hr nurse advice line magnet.   Appreciative of visit.   Notification sent to inpatient RNCM to make aware [facility] Care Management is following.   Will update [name] of bedside visit.   Chart reviewed. Noted risk of unplanned readmission score is 13% at this time.  Jordan Noble, MSN-Ed, RN,BSN [facility] Liaison [phone]

## 2017-02-11 ENCOUNTER — Telehealth: Payer: Self-pay | Admitting: Family Medicine

## 2017-02-11 LAB — CBC
HEMATOCRIT: 36.1 % — AB (ref 39.0–52.0)
HEMOGLOBIN: 11.6 g/dL — AB (ref 13.0–17.0)
MCH: 29.6 pg (ref 26.0–34.0)
MCHC: 32.1 g/dL (ref 30.0–36.0)
MCV: 92.1 fL (ref 78.0–100.0)
Platelets: 190 10*3/uL (ref 150–400)
RBC: 3.92 MIL/uL — AB (ref 4.22–5.81)
RDW: 13.2 % (ref 11.5–15.5)
WBC: 10.9 10*3/uL — AB (ref 4.0–10.5)

## 2017-02-11 LAB — VANCOMYCIN, TROUGH: VANCOMYCIN TR: 10 ug/mL — AB (ref 15–20)

## 2017-02-11 LAB — GLUCOSE, CAPILLARY: GLUCOSE-CAPILLARY: 87 mg/dL (ref 65–99)

## 2017-02-11 MED ORDER — ACETAMINOPHEN 325 MG PO TABS: 650.0000 mg | ORAL_TABLET | Freq: Four times a day (QID) | ORAL | Status: DC | PRN

## 2017-02-11 MED ORDER — DOXYCYCLINE HYCLATE 100 MG PO CAPS: 100.0000 mg | ORAL_CAPSULE | Freq: Two times a day (BID) | ORAL | 0 refills | Status: DC

## 2017-02-11 MED ORDER — VANCOMYCIN HCL 10 G IV SOLR
1250.0000 mg | Freq: Two times a day (BID) | INTRAVENOUS | Status: DC
  Administered 2017-02-11: 1250 mg via INTRAVENOUS
  Filled 2017-02-11 (×2): qty 1250

## 2017-02-11 MED ORDER — CEFPODOXIME PROXETIL 200 MG PO TABS: 200.0000 mg | ORAL_TABLET | Freq: Two times a day (BID) | ORAL | 0 refills | Status: AC
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
## 2017-02-11 NOTE — Telephone Encounter (Signed)
Will forward to MD to make aware. Jazmin Hartsell,CMA  

## 2017-02-11 NOTE — Progress Notes (Signed)
[facility] [place] [facility]/Trauma Progress Note      Assessment/Plan Principal Problem: Anal abscess Active Problems: Morbid obesity (HCC) CHF NYHA class III (HCC) Spinal cord injury at C5-C7 level without injury of spinal bone (HCC)  Anal abscess - repeat bedside I&D with iodoform packing, 02/04 - packing removed 02/06 - change outer dressing as needed as it gets soiled. - sitz baths 4 times a day - PO abx at discharge - okay for discharge from surgical standpoint  Follow up 2 weeks dow clinic     LOS: 2 days    Subjective: CC: anal abscess  Pt states he is feeling better and having less pain. No fever or chills.  Objective: Vital signs in last 24 hours: Temp:  [98.1 F (36.7 C)-98.8 F (37.1 C)] 98.8 F (37.1 C) (02/06 0449) Pulse Rate:  [81-83] 83 (02/06 0449) Resp:  [18-20] 18 (02/06 0449) BP: (105-120)/(65-80) 105/65 (02/06 0449) SpO2:  [95 %] 95 % (02/06 0449) Weight:  [401 lb 3.8 oz (182 kg)] 401 lb 3.8 oz (182 kg) (02/06 0449) Last BM Date: 02/08/17  Intake/Output from previous day: 02/05 0701 - 02/06 0700 In: 960 [P.O.:960] Out: 1650 [Urine:1650] Intake/Output this shift: Total I/O In: 120 [P.O.:120] Out: 175 [Urine:175]  PE: Gen:  Alert, NAD, pleasant, cooperative Pulm:  Rate and effort normal GU: anal wound wick removed, some bloody purulent drainage with dressing change, mild pain with wick removal  Anti-infectives: Anti-infectives (From admission, onward)   Start     Dose/Rate Route Frequency Ordered Stop   02/09/17 2000  vancomycin (VANCOCIN) 1,250 mg in sodium chloride 0.9 % 250 mL IVPB     1,250 mg 166.7 mL/hr over 90 Minutes Intravenous Every 12 hours 02/09/17 0754     02/09/17 1400  piperacillin-tazobactam (ZOSYN) IVPB 3.375 g     3.375 g 12.5 mL/hr over 240 Minutes Intravenous Every 8 hours 02/09/17 0754     02/09/17 0800  piperacillin-tazobactam (ZOSYN) IVPB 3.375 g     3.375 g 100 mL/hr over 30 Minutes Intravenous   Once 02/09/17 0745 02/09/17 0916   02/09/17 0800  vancomycin (VANCOCIN) IVPB 1000 mg/200 mL premix  Status:  Discontinued     1,000 mg 200 mL/hr over 60 Minutes Intravenous  Once 02/09/17 0745 02/09/17 0748   02/09/17 0800  vancomycin (VANCOCIN) 2,500 mg in sodium chloride 0.9 % 500 mL IVPB     2,500 mg 250 mL/hr over 120 Minutes Intravenous  Once 02/09/17 0748 02/09/17 1147   02/09/17 0000  doxycycline (VIBRAMYCIN) 100 MG capsule     100 mg Oral 2 times daily 02/09/17 0623        Lab Results:  Recent Labs    02/10/17 0706 02/11/17 0543  WBC 11.7* 10.9*  HGB 11.1* 11.6*  HCT 34.4* 36.1*  PLT 205 190   BMET Recent Labs    02/09/17 0645 02/10/17 0706  NA 136 137  K 4.0 4.1  CL 97* 100*  CO2 24 27  GLUCOSE 109* 124*  BUN 8 7  CREATININE 1.12 1.01  CALCIUM 9.0 8.6*   PT/INR No results for input(s): LABPROT, INR in the last 72 hours. CMP     Component Value Date/Time   NA 137 02/10/2017 0706   K 4.1 02/10/2017 0706   CL 100 (L) 02/10/2017 0706   CO2 27 02/10/2017 0706   GLUCOSE 124 (H) 02/10/2017 0706   BUN 7 02/10/2017 0706   CREATININE 1.01 02/10/2017 0706   CREATININE 1.04 10/08/2015 1544  CALCIUM 8.6 (L) 02/10/2017 0706   PROT 7.8 02/09/2017 0645   ALBUMIN 3.2 (L) 02/09/2017 0645   AST 29 02/09/2017 0645   ALT 23 02/09/2017 0645   ALKPHOS 50 02/09/2017 0645   BILITOT 1.2 02/09/2017 0645   GFRNONAA >60 02/10/2017 0706   GFRNONAA 86 10/08/2015 1544   GFRAA >60 02/10/2017 0706   GFRAA >89 10/08/2015 1544   Lipase  No results found for: LIPASE  Studies/Results: No results found.    Jerre Simon , [facility] 02/11/2017, 8:54 AM Pager: [phone] Consults: [phone] Mon-Fri 7:00 am-4:30 pm Sat-Sun 7:00 am-11:30 am

## 2017-02-11 NOTE — Progress Notes (Signed)
[name] to be D/C'd  per MD order. Discussed with the patient and all questions fully answered.  VSS, Skin clean, dry and intact without evidence of skin break down, no evidence of skin tears noted.  IV catheter discontinued intact. Site without signs and symptoms of complications. Dressing and pressure applied.  An After Visit Summary was printed and given to the patient. Patient received written prescriptions.  Educated on use of sitz bath.  D/c education completed with patient/family including follow up instructions, medication list, d/c activities limitations if indicated, with other d/c instructions as indicated by MD - patient able to verbalize understanding, all questions fully answered.   Patient instructed to return to ED, call 911, or call MD for any changes in condition.   Patient to be escorted via WC, and D/C home via private auto.

## 2017-02-11 NOTE — Discharge Instructions (Signed)
Disposable Sitz Bath °A disposable sitz bath is a plastic basin that fits over the toilet. A bag is hung above the toilet, and the bag is connected to a tube that opens into the basin. The bag is filled with warm water that flows into the basin through the tube. A sitz bath can be used to help relieve symptoms, clean, and promote healing in the genital and anal areas, as well as in the lower abdomen and buttocks. °What are the risks? °Sitz baths are generally very safe. It is possible for the skin between the genitals and the anus (perineum) to become infected, but this is rare. You can avoid this by cleaning your sitz bath supplies thoroughly. °How to use a disposable sitz bath °1. Close the clamp on the tube. Make sure the clamp is closed tightly to prevent leakage. °2. Fill the sitz bath basin and the plastic bag with warm water. The water should be warm enough to be comfortable, but not hot. °3. Raise the toilet seat and place the filled basin on the toilet. Make sure the overflow opening is facing toward the back of the toilet. °? If you prefer, you may place the empty basin on the toilet first, and then use the plastic bag to fill the basin with warm water. °4. Hang the filled plastic bag overhead on a hook or towel rack close to the toilet. The bag should be higher than the toilet so that the water will flow down through the tube. °5. Attach the tube to the opening on the basin. Make sure that the tube is attached to the basin tightly to prevent leakage. °6. Sit on the basin and release the clamp. This will allow warm water to flow into the basin and flush the area around your genitals and anus. °7. Remain sitting on the basin for about 15-20 minutes, or as long as told by your health care provider. °8. Stand up and gently pat your skin dry. If directed, apply clean bandages (dressings) to the affected area as told by your health care provider. °9. Carefully remove the basin from the toilet seat and tip the  basin into the toilet to empty any remaining water. Empty any remaining water from the plastic bag into the toilet. Then, flush the toilet. °10. Wash the basin with warm water and soap. Let the basin air dry in the sink. You should also let the plastic bag and the tubing air dry. °11. Store the basin, tubing, and plastic bag in a clean, dry area. °12. Wash your hands with soap and water. If soap and water are not available, use hand sanitizer. °Contact a health care provider if: °· You have symptoms that get worse instead of better. °· You develop new skin irritation, redness, or swelling around your genitals or anus. °This information is not intended to replace advice given to you by your health care provider. Make sure you discuss any questions you have with your health care provider. °Document Released: 06/24/2011 Document Revised: 05/31/2015 Document Reviewed: 11/12/2014 °Elsevier Interactive Patient Education © 2018 Elsevier Inc. ° °
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
## 2017-02-11 NOTE — Telephone Encounter (Signed)
Kim with [employer] called to let Dr [name] this patient was admitted to the hospital on 2/4.

## 2017-02-13 ENCOUNTER — Other Ambulatory Visit: Payer: Self-pay

## 2017-02-13 NOTE — Patient Outreach (Signed)
[facility]) Care Management  02/13/2017  Jordan Jensen 02-25-1970 [id number]   Transition of Care Week #1    Outreach attempt #1 to patient. Spoke with patient. He voices he is doing fairly well since return home. Patient voices that he is not having any pain but some mild soreness. Patient was admitted to hospital from 02/09/17 to 02/11/17. While in the hospital he had I&D of perianal abscess. He voices that he is adhering to wound care instructions. He is taking sitz baths and his fiance is helping with him wound care. He denies any s/s of infection. He is currently taking two antibiotics. He is aware of the importance of completing therapy. He voices appetite is good. RN CM educated patient on importance of proper diet to aide in wound healing. He has made surgeon follow up appt. Patient has not called PCP office to make appt but voices he will do so today. Patient has [facility] / RN CM contact info and knows how to reach staff for any needs or concerns.     Plan: RN CM will make outreach attempt to patient within a week for TOC call.   Antionette Fairy, RN,BSN,CCM [facility] Care Management Telephonic Care Management Coordinator Direct Phone: [phone] Toll Free: [phone] Fax: [phone]

## 2017-02-14 LAB — CULTURE, BLOOD (ROUTINE X 2)
CULTURE: NO GROWTH
CULTURE: NO GROWTH
Special Requests: ADEQUATE
Special Requests: ADEQUATE

## 2017-02-19 ENCOUNTER — Other Ambulatory Visit: Payer: Self-pay
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
## 2017-02-20 ENCOUNTER — Ambulatory Visit (INDEPENDENT_AMBULATORY_CARE_PROVIDER_SITE_OTHER): Payer: Medicare HMO | Admitting: Family Medicine

## 2017-02-20 ENCOUNTER — Encounter: Payer: Self-pay | Admitting: Family Medicine

## 2017-02-20 ENCOUNTER — Other Ambulatory Visit: Payer: Self-pay

## 2017-02-20 VITALS — BP 122/76 | HR 52 | Temp 98.3°F | Wt 398.0 lb

## 2017-02-20 DIAGNOSIS — R0683 Snoring: Secondary | ICD-10-CM | POA: Diagnosis not present

## 2017-02-20 DIAGNOSIS — G8250 Quadriplegia, unspecified: Secondary | ICD-10-CM | POA: Diagnosis not present

## 2017-02-20 DIAGNOSIS — Z9189 Other specified personal risk factors, not elsewhere classified: Secondary | ICD-10-CM

## 2017-02-20 DIAGNOSIS — I890 Lymphedema, not elsewhere classified: Secondary | ICD-10-CM | POA: Diagnosis not present

## 2017-02-20 DIAGNOSIS — R600 Localized edema: Secondary | ICD-10-CM | POA: Diagnosis not present

## 2017-02-20 DIAGNOSIS — K610 Anal abscess: Secondary | ICD-10-CM

## 2017-02-20 NOTE — Progress Notes (Signed)
Subjective:  Jordan Jensen is a 47 y.o. male who presents to the [facility] today for hospital follow up.  HPI:  Multiple recent hospitalizations: Was hospitalized from 01/16/17 to 01/21/17 for cellulitis.   Most recent hospitalization from 02/09/17 to 02/11/17 for perianal abscess that required incision and drainage.  Was discharged on Cefpodoxime and doxycycline.  He states he has 2 more days of antibiotics and has planned follow-up with [place] [facility] on 02/26/17.  During his last hospitalization he was told that he needed CPAP at night from apneic events.   He had once been told that he had OSA which nearly resolved with weight loss however he has regained that weight and his symptoms are now worse than ever.  He has some paperwork with him from [employer] that he would need in order to be released to go back to work.  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  Has otherwise been doing well at home.  No fevers or chills.  Taking his antibiotics well.  His legs have not been more swollen than usual.  Denies erythema, warmth, rashes.   He still has some DME needs that have yet to be met despite multiple attempts over the last year. Still needs compression stockings, bariatric shower chair as a shower chair that advanced at home was too small, and crutches.    ROS: Per HPI  Objective:  Physical Exam: BP 122/76   Pulse (!) 52   Temp 98.3 F (36.8 C) (Oral)   Wt (!) 398 lb (180.5 kg)   SpO2 94%   BMI 55.51 kg/m   Gen: NAD, resting comfortably in wheelchair, appears well CV: RRR with no murmurs appreciated Pulm: NWOB, CTAB with no crackles, wheezes, or rhonchi GI: Normal bowel sounds present. Soft, Nontender, Nondistended. MSK: Lower extremities with compression hose in place bilaterally, no erythema or warmth  underneath. Psych: Normal affect and thought content  Blood culture from 02/09/17 no growth at 5 days    Assessment/Plan:  Snoring Patient has symptoms of OSA with heavy snoring and what sounds like apneic events at night.  He is morbidly obese and is certainly at risk for this.  Referral to sleep medicine placed today  Bilateral leg edema Lymphedema appears to be at his baseline.  He reports he is doing well after his hospitalization in January for cellulitis.  Compression stockings will be essential in preventing recurrent infection.  He has been unsuccessful in contacting the home health agency that previously came to his house to measure him for these so paper prescription given today and patient encouraged to go to a medical supply store himself.  Quadriplegia and quadriparesis [facility]) Patient has multiple DME needs that have not been met despite reaching out directly to [facility] as he reports that the shower chair they gave him was not the right size.  Protrude prescription for bariatric shower chair and crutches given to patient today  Anal abscess Patient appears to be recovering from this well without any symptoms of recurrent infection.  He is still on p.o. antibiotics Cefpodoxime and doxycycline.  He will follow-up with [place] [facility] on 02/26/17.  If he is cleared by them to return to work at that time and he will drop off his paperwork for me to  complete.   Bufford Lope, DO PGY-2, [facility] 02/20/2017 2:56 PM

## 2017-02-20 NOTE — Patient Outreach (Signed)
[facility]) Care Management  02/20/2017  Jordan Jensen 1970-10-13 [id number]   Transition of Care Week #2     Outreach attempt #1 to patient. Spoke with patient who could only talk briefly. He is pleased to report that is doing better and continues to make progress. He voices that he finished one antibiotic and is almost done with the second one. Patient along with his caregivers continue to change dressing and he reports wound is healing. He denies any s/s of infection, pain or soreness. Patient goes for PCP follow up appt later today. He has follow up appt with surgeon soon as well. He continues to report appetite is good. He continues to weigh and weight remains stable.       Plan: RN CM will continue with weekly transition of care calls to patient.     Antionette Fairy, RN,BSN,CCM [facility] Care Management Telephonic Care Management Coordinator Direct Phone: [phone] Toll Free: [phone] Fax: [phone]

## 2017-02-20 NOTE — Patient Instructions (Addendum)
It was good to see you today!  Go to a medical supply like [employer] for your compression stockings. I have given you printed prescriptions for shower chair and crutches as well.  Referral placed to sleep medicine, please let us know if you have not heard about about scheduling after 2 weeks.   If you can please come back in 1-2 months as we need to follow up on all your medical problems.  Please bring all of your medications with you to each visit.   Sign up for My Chart to have easy access to your labs results, and communication with your primary care physician.  Feel free to call with any questions or concerns at any time, at [phone].   Take care,  Dr. [name], DO [facility]

## 2017-02-21 NOTE — Assessment & Plan Note (Signed)
Patient has symptoms of OSA with heavy snoring and what sounds like apneic events at night.  He is morbidly obese and is certainly at risk for this.  Referral to sleep medicine placed today

## 2017-02-21 NOTE — Assessment & Plan Note (Signed)
Patient appears to be recovering from this well without any symptoms of recurrent infection.  He is still on p.o. antibiotics Cefpodoxime and doxycycline.  He will follow-up with [place] [facility] on 02/26/17.  If he is cleared by them to return to work at that time and he will drop off his paperwork for me to complete.

## 2017-02-21 NOTE — Assessment & Plan Note (Signed)
>>  ASSESSMENT AND PLAN FOR BILATERAL LEG EDEMA WRITTEN ON 02/21/2017  4:24 PM BY YOO, ELSIA J, DO  Lymphedema appears to be at his baseline.  He reports he is doing well after his hospitalization in January for cellulitis.  Compression stockings will be essential in preventing recurrent infection.  He has been unsuccessful in contacting the home health agency that previously came to his house to measure him for these so paper prescription given today and patient encouraged to go to a medical supply store himself.

## 2017-02-21 NOTE — Assessment & Plan Note (Signed)
Lymphedema appears to be at his baseline.  He reports he is doing well after his hospitalization in January for cellulitis.  Compression stockings will be essential in preventing recurrent infection.  He has been unsuccessful in contacting the home health agency that previously came to his house to measure him for these so paper prescription given today and patient encouraged to go to a medical supply store himself.

## 2017-02-21 NOTE — Assessment & Plan Note (Signed)
Patient has multiple DME needs that have not been met despite reaching out directly to [facility] home [facility] as he reports that the shower chair they gave him was not the right size.  Protrude prescription for bariatric shower chair and crutches given to patient today

## 2017-02-26 ENCOUNTER — Telehealth: Payer: Self-pay | Admitting: Family Medicine

## 2017-02-26 NOTE — Telephone Encounter (Signed)
Pt came in office requesting to fill and sign his 3 different FMLA forms by his MD. His last DOS 02-20-2017. Best phone # to contact is [phone]. Forms were placed in [facility] Team folder.

## 2017-02-27 ENCOUNTER — Other Ambulatory Visit: Payer: Self-pay

## 2017-02-27 NOTE — Telephone Encounter (Signed)
Will place forms in provider's box for completion. Jazmin Hartsell,CMA

## 2017-02-27 NOTE — Patient Outreach (Signed)
[facility]) Care Management  02/27/2017  Jordan Jensen [birth year] [id number]    Transition of Care Week #3    Outreach attempt #1 to patient. No answer at present. RN CM left HIPAA compliant voicemail message along with contact info.      Plan: RN CM will make outreach attempt to patient within one business day if no return call from patient.    Antionette Fairy, RN,BSN,CCM [facility] Care Management Telephonic Care Management Coordinator Direct Phone: [phone] Toll Free: [phone] Fax: [phone]

## 2017-03-02 ENCOUNTER — Other Ambulatory Visit: Payer: Self-pay

## 2017-03-02 NOTE — Telephone Encounter (Signed)
Patient has 3 copies of same form. First attached copy was completed although section II was not filled out by patient prior to drop off as per form instructions. 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 Forms placed in RN office.

## 2017-03-02 NOTE — Patient Outreach (Signed)
[facility]) Care Management  03/02/2017  Jordan Jensen 16-Jan-1970 [id number]   Transition of Care Week #3   Outreach attempt #2 to patient. No answer at present.       Plan: RN CM will send unsuccessful outreach letter to patient, wait 10 business days then make final call attempt and close case if no response from patient.   Antionette Fairy, RN,BSN,CCM [facility] Care Management Telephonic Care Management Coordinator Direct Phone: [phone] Toll Free: [phone] Fax: [phone]

## 2017-03-03 NOTE — Telephone Encounter (Signed)
Patient made aware forms are ready. Placed at front desk. Copy made for scanning. Ples Specter, RN [facility] RN)

## 2017-03-10 ENCOUNTER — Telehealth: Payer: Self-pay | Admitting: Family Medicine

## 2017-03-10 NOTE — Telephone Encounter (Signed)
Case worker faxed forms for the patient on 03/06/2017 for Dr. [name] to fill out. Since Dr. [name] is out of the office until 03/14/2017 can Dr. Mosetta Putt fill these out since she is covering for Dr. [name]? He needs these for his employer/ [employer]

## 2017-03-10 NOTE — Telephone Encounter (Signed)
Will forward to MD to fill out once they have been received in her mailbox.  Jazmin Hartsell,CMA

## 2017-03-13 ENCOUNTER — Telehealth: Payer: Self-pay | Admitting: Family Medicine

## 2017-03-13 ENCOUNTER — Ambulatory Visit: Payer: Self-pay

## 2017-03-13 NOTE — Telephone Encounter (Signed)
Needs to be filled out by PCP. She will be back next week. Form is in her box.

## 2017-03-16 ENCOUNTER — Other Ambulatory Visit: Payer: Self-pay

## 2017-03-16 ENCOUNTER — Encounter (HOSPITAL_COMMUNITY): Payer: Self-pay | Admitting: Emergency Medicine

## 2017-03-16 ENCOUNTER — Emergency Department (HOSPITAL_COMMUNITY)
Admission: EM | Admit: 2017-03-16 | Discharge: 2017-03-16 | Disposition: A | Discharge status: Home / Self Care | Payer: BLUE CROSS/BLUE SHIELD | Attending: Emergency Medicine | Admitting: Emergency Medicine

## 2017-03-16 DIAGNOSIS — Z96649 Presence of unspecified artificial hip joint: Secondary | ICD-10-CM | POA: Insufficient documentation

## 2017-03-16 DIAGNOSIS — R350 Frequency of micturition: Secondary | ICD-10-CM | POA: Diagnosis not present

## 2017-03-16 DIAGNOSIS — I110 Hypertensive heart disease with heart failure: Secondary | ICD-10-CM | POA: Diagnosis not present

## 2017-03-16 DIAGNOSIS — L03116 Cellulitis of left lower limb: Secondary | ICD-10-CM | POA: Diagnosis not present

## 2017-03-16 DIAGNOSIS — Z79899 Other long term (current) drug therapy: Secondary | ICD-10-CM | POA: Diagnosis not present

## 2017-03-16 DIAGNOSIS — Z91040 Latex allergy status: Secondary | ICD-10-CM | POA: Diagnosis not present

## 2017-03-16 DIAGNOSIS — I509 Heart failure, unspecified: Secondary | ICD-10-CM | POA: Insufficient documentation

## 2017-03-16 DIAGNOSIS — R2242 Localized swelling, mass and lump, left lower limb: Secondary | ICD-10-CM | POA: Diagnosis present

## 2017-03-16 LAB — URINALYSIS, ROUTINE W REFLEX MICROSCOPIC
Bilirubin Urine: NEGATIVE
Glucose, UA: NEGATIVE mg/dL
HGB URINE DIPSTICK: NEGATIVE
KETONES UR: NEGATIVE mg/dL
Leukocytes, UA: NEGATIVE
Nitrite: NEGATIVE
PROTEIN: NEGATIVE mg/dL
Specific Gravity, Urine: 1.023 (ref 1.005–1.030)
pH: 5 (ref 5.0–8.0)

## 2017-03-16 LAB — CBC WITH DIFFERENTIAL/PLATELET
BASOS ABS: 0 10*3/uL (ref 0.0–0.1)
Basophils Relative: 0 %
EOS ABS: 0.1 10*3/uL (ref 0.0–0.7)
EOS PCT: 1 %
HCT: 41.4 % (ref 39.0–52.0)
Hemoglobin: 13 g/dL (ref 13.0–17.0)
LYMPHS ABS: 1.2 10*3/uL (ref 0.7–4.0)
LYMPHS PCT: 9 %
MCH: 29 pg (ref 26.0–34.0)
MCHC: 31.4 g/dL (ref 30.0–36.0)
MCV: 92.2 fL (ref 78.0–100.0)
MONO ABS: 0.7 10*3/uL (ref 0.1–1.0)
Monocytes Relative: 5 %
Neutro Abs: 11.3 10*3/uL — ABNORMAL HIGH (ref 1.7–7.7)
Neutrophils Relative %: 85 %
PLATELETS: 150 10*3/uL (ref 150–400)
RBC: 4.49 MIL/uL (ref 4.22–5.81)
RDW: 14 % (ref 11.5–15.5)
WBC: 13.3 10*3/uL — ABNORMAL HIGH (ref 4.0–10.5)

## 2017-03-16 LAB — COMPREHENSIVE METABOLIC PANEL
ALBUMIN: 3.6 g/dL (ref 3.5–5.0)
ALK PHOS: 51 U/L (ref 38–126)
ALT: 31 U/L (ref 17–63)
AST: 27 U/L (ref 15–41)
Anion gap: 10 (ref 5–15)
BUN: 10 mg/dL (ref 6–20)
CO2: 25 mmol/L (ref 22–32)
CREATININE: 0.97 mg/dL (ref 0.61–1.24)
Calcium: 9.1 mg/dL (ref 8.9–10.3)
Chloride: 102 mmol/L (ref 101–111)
GFR calc Af Amer: >60 mL/min (ref >60)
GFR calc non Af Amer: >60 mL/min (ref >60)
GLUCOSE: 106 mg/dL — AB (ref 65–99)
POTASSIUM: 3.7 mmol/L (ref 3.5–5.1)
Sodium: 137 mmol/L (ref 135–145)
TOTAL PROTEIN: 8 g/dL (ref 6.5–8.1)
Total Bilirubin: 1.1 mg/dL (ref 0.3–1.2)

## 2017-03-16 MED ORDER — SODIUM CHLORIDE 0.9 % IV SOLN
1.0000 g | Freq: Once | INTRAVENOUS | Status: AC
  Administered 2017-03-16: 1 g via INTRAVENOUS
  Filled 2017-03-16: qty 10

## 2017-03-16 MED ORDER — CEPHALEXIN 500 MG PO CAPS: 500.0000 mg | ORAL_CAPSULE | Freq: Four times a day (QID) | ORAL | 0 refills | Status: DC

## 2017-03-16 NOTE — ED Triage Notes (Signed)
PT c/o lower back pain with increased urinary frequency and dark colored urine x2 days.

## 2017-03-16 NOTE — Patient Outreach (Signed)
[facility]) Care Management  03/16/2017  Jordan Jensen [birth year] [id number]   Transition of Care Case Closure     Multiple attempts to establish contact with patient without success. No response from letter mailed to patient. Case is being closed at this time.     Plan: RN CM will notify [facility] administrative assistant of case status. RN CM will send MD case closure letter to patient.    Antionette Fairy, RN,BSN,CCM [facility] Care Management Telephonic Care Management Coordinator Direct Phone: [phone] Toll Free: [phone] Fax: [phone]

## 2017-03-16 NOTE — Discharge Instructions (Signed)
Your evaluated in the emergency department for not feeling well with back pain and some dark urine.  You do not have an obvious urine infection but we have concerns he may be beginning some cellulitis on her left leg.  We are giving you some antibiotics to take but if your symptoms are getting worse with running a fever you should return to the hospital.

## 2017-03-16 NOTE — Telephone Encounter (Signed)
Pt is calling again about these forms, pt needs these filled out ASAP. Annice Pih has them in her office. Per Annice Pih pt will lose his job if forms aren't filled out by 3/15. Please advise.
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
## 2017-03-16 NOTE — Telephone Encounter (Signed)
Form completed and Jordan Jensen to call patient that form ready for pick up.

## 2017-03-21 LAB — CULTURE, BLOOD (ROUTINE X 2)
CULTURE: NO GROWTH
Culture: NO GROWTH
Special Requests: ADEQUATE

## 2017-03-24 ENCOUNTER — Ambulatory Visit: Payer: Medicare HMO | Admitting: Family Medicine

## 2017-03-26 DIAGNOSIS — R0600 Dyspnea, unspecified: Secondary | ICD-10-CM | POA: Diagnosis not present

## 2017-03-26 DIAGNOSIS — R0602 Shortness of breath: Secondary | ICD-10-CM | POA: Diagnosis not present

## 2017-03-26 DIAGNOSIS — Z87891 Personal history of nicotine dependence: Secondary | ICD-10-CM | POA: Diagnosis not present

## 2017-03-26 DIAGNOSIS — R531 Weakness: Secondary | ICD-10-CM | POA: Diagnosis not present

## 2017-03-26 DIAGNOSIS — I509 Heart failure, unspecified: Secondary | ICD-10-CM | POA: Diagnosis not present

## 2017-03-26 DIAGNOSIS — Z79899 Other long term (current) drug therapy: Secondary | ICD-10-CM | POA: Diagnosis not present

## 2017-03-26 DIAGNOSIS — I110 Hypertensive heart disease with heart failure: Secondary | ICD-10-CM | POA: Diagnosis not present

## 2017-03-26 DIAGNOSIS — R0789 Other chest pain: Secondary | ICD-10-CM | POA: Diagnosis not present

## 2017-04-02 ENCOUNTER — Telehealth: Payer: Self-pay | Admitting: Neurology

## 2017-04-02 ENCOUNTER — Institutional Professional Consult (permissible substitution): Payer: Self-pay | Admitting: Neurology

## 2017-04-02 NOTE — Telephone Encounter (Signed)
No showed to apt today.

## 2017-04-03 ENCOUNTER — Encounter: Payer: Self-pay | Admitting: Neurology

## 2017-04-08 DIAGNOSIS — H52209 Unspecified astigmatism, unspecified eye: Secondary | ICD-10-CM | POA: Diagnosis not present

## 2017-04-08 DIAGNOSIS — H5213 Myopia, bilateral: Secondary | ICD-10-CM | POA: Diagnosis not present

## 2017-05-05 ENCOUNTER — Ambulatory Visit: Payer: Medicare HMO | Admitting: Family Medicine

## 2017-07-04 ENCOUNTER — Emergency Department (HOSPITAL_COMMUNITY): Payer: Medicare HMO | Admitting: Certified Registered Nurse Anesthetist

## 2017-07-04 ENCOUNTER — Encounter (HOSPITAL_COMMUNITY): Admission: EM | Disposition: A | Discharge status: Home / Self Care | Payer: Self-pay | Source: Home / Self Care

## 2017-07-04 ENCOUNTER — Encounter (HOSPITAL_COMMUNITY): Payer: Self-pay | Admitting: Emergency Medicine

## 2017-07-04 ENCOUNTER — Inpatient Hospital Stay (HOSPITAL_COMMUNITY)
Admission: EM | Admit: 2017-07-04 | Discharge: 2017-07-06 | DRG: 347 | Disposition: A | Discharge status: Home / Self Care | Payer: Medicare HMO | Attending: General Surgery | Admitting: General Surgery

## 2017-07-04 ENCOUNTER — Emergency Department (HOSPITAL_COMMUNITY): Payer: Medicare HMO

## 2017-07-04 DIAGNOSIS — Z79899 Other long term (current) drug therapy: Secondary | ICD-10-CM

## 2017-07-04 DIAGNOSIS — Z6841 Body Mass Index (BMI) 40.0 and over, adult: Secondary | ICD-10-CM | POA: Diagnosis not present

## 2017-07-04 DIAGNOSIS — I509 Heart failure, unspecified: Secondary | ICD-10-CM | POA: Diagnosis present

## 2017-07-04 DIAGNOSIS — K611 Rectal abscess: Secondary | ICD-10-CM | POA: Diagnosis present

## 2017-07-04 DIAGNOSIS — Z981 Arthrodesis status: Secondary | ICD-10-CM | POA: Diagnosis not present

## 2017-07-04 DIAGNOSIS — S14107S Unspecified injury at C7 level of cervical spinal cord, sequela: Secondary | ICD-10-CM

## 2017-07-04 DIAGNOSIS — G4733 Obstructive sleep apnea (adult) (pediatric): Secondary | ICD-10-CM | POA: Diagnosis present

## 2017-07-04 DIAGNOSIS — K219 Gastro-esophageal reflux disease without esophagitis: Secondary | ICD-10-CM | POA: Diagnosis not present

## 2017-07-04 DIAGNOSIS — I10 Essential (primary) hypertension: Secondary | ICD-10-CM | POA: Diagnosis not present

## 2017-07-04 DIAGNOSIS — G8250 Quadriplegia, unspecified: Secondary | ICD-10-CM | POA: Diagnosis not present

## 2017-07-04 DIAGNOSIS — K612 Anorectal abscess: Principal | ICD-10-CM | POA: Diagnosis present

## 2017-07-04 DIAGNOSIS — I493 Ventricular premature depolarization: Secondary | ICD-10-CM | POA: Diagnosis not present

## 2017-07-04 DIAGNOSIS — Z888 Allergy status to other drugs, medicaments and biological substances status: Secondary | ICD-10-CM | POA: Diagnosis not present

## 2017-07-04 DIAGNOSIS — I110 Hypertensive heart disease with heart failure: Secondary | ICD-10-CM | POA: Diagnosis present

## 2017-07-04 DIAGNOSIS — K4090 Unilateral inguinal hernia, without obstruction or gangrene, not specified as recurrent: Secondary | ICD-10-CM | POA: Diagnosis not present

## 2017-07-04 DIAGNOSIS — Z993 Dependence on wheelchair: Secondary | ICD-10-CM

## 2017-07-04 DIAGNOSIS — Z87891 Personal history of nicotine dependence: Secondary | ICD-10-CM

## 2017-07-04 DIAGNOSIS — Z91040 Latex allergy status: Secondary | ICD-10-CM | POA: Diagnosis not present

## 2017-07-04 HISTORY — PX: INCISION AND DRAINAGE PERIRECTAL ABSCESS: SHX1804

## 2017-07-04 LAB — COMPREHENSIVE METABOLIC PANEL
ALK PHOS: 63 U/L (ref 38–126)
ALT: 21 U/L (ref 0–44)
ANION GAP: 10 (ref 5–15)
AST: 26 U/L (ref 15–41)
Albumin: 3.6 g/dL (ref 3.5–5.0)
BUN: 11 mg/dL (ref 6–20)
CALCIUM: 9.2 mg/dL (ref 8.9–10.3)
CO2: 27 mmol/L (ref 22–32)
Chloride: 98 mmol/L (ref 98–111)
Creatinine, Ser: 1.12 mg/dL (ref 0.61–1.24)
GFR calc Af Amer: >60 mL/min (ref >60)
GFR calc non Af Amer: >60 mL/min (ref >60)
Glucose, Bld: 101 mg/dL — ABNORMAL HIGH (ref 70–99)
POTASSIUM: 4.1 mmol/L (ref 3.5–5.1)
SODIUM: 135 mmol/L (ref 135–145)
Total Bilirubin: 1.5 mg/dL — ABNORMAL HIGH (ref 0.3–1.2)
Total Protein: 8 g/dL (ref 6.5–8.1)

## 2017-07-04 LAB — CBC WITH DIFFERENTIAL/PLATELET
Abs Immature Granulocytes: 0.1 10*3/uL (ref 0.0–0.1)
BASOS PCT: 0 %
Basophils Absolute: 0.1 10*3/uL (ref 0.0–0.1)
Eosinophils Absolute: 0.2 10*3/uL (ref 0.0–0.7)
Eosinophils Relative: 1 %
HCT: 45.4 % (ref 39.0–52.0)
Hemoglobin: 14.5 g/dL (ref 13.0–17.0)
IMMATURE GRANULOCYTES: 1 %
LYMPHS PCT: 11 %
Lymphs Abs: 1.8 10*3/uL (ref 0.7–4.0)
MCH: 29.1 pg (ref 26.0–34.0)
MCHC: 31.9 g/dL (ref 30.0–36.0)
MCV: 91 fL (ref 78.0–100.0)
MONOS PCT: 7 %
Monocytes Absolute: 1.2 10*3/uL — ABNORMAL HIGH (ref 0.1–1.0)
NEUTROS ABS: 14.3 10*3/uL — AB (ref 1.7–7.7)
NEUTROS PCT: 80 %
PLATELETS: 173 10*3/uL (ref 150–400)
RBC: 4.99 MIL/uL (ref 4.22–5.81)
RDW: 12.8 % (ref 11.5–15.5)
WBC: 17.6 10*3/uL — ABNORMAL HIGH (ref 4.0–10.5)

## 2017-07-04 LAB — I-STAT CG4 LACTIC ACID, ED: Lactic Acid, Venous: 1.29 mmol/L (ref 0.5–1.9)

## 2017-07-04 LAB — MRSA PCR SCREENING: MRSA BY PCR: NEGATIVE

## 2017-07-04 SURGERY — INCISION AND DRAINAGE, ABSCESS, PERIRECTAL
Anesthesia: General | Laterality: Left

## 2017-07-04 MED ORDER — HYDROCODONE-ACETAMINOPHEN 5-325 MG PO TABS
1.0000 | ORAL_TABLET | Freq: Four times a day (QID) | ORAL | Status: DC | PRN
  Administered 2017-07-06: 2 via ORAL
  Filled 2017-07-04: qty 2

## 2017-07-04 MED ORDER — PIPERACILLIN-TAZOBACTAM 3.375 G IVPB
3.3750 g | Freq: Three times a day (TID) | INTRAVENOUS | Status: DC
Start: 2017-07-05 — End: 2017-07-06
  Administered 2017-07-05 – 2017-07-06 (×5): 3.375 g via INTRAVENOUS
  Filled 2017-07-04 (×7): qty 50

## 2017-07-04 MED ORDER — SUCCINYLCHOLINE CHLORIDE 20 MG/ML IJ SOLN
INTRAMUSCULAR | Status: DC | PRN
  Administered 2017-07-04: 100 mg via INTRAVENOUS

## 2017-07-04 MED ORDER — ONDANSETRON HCL 4 MG/2ML IJ SOLN
INTRAMUSCULAR | Status: DC | PRN
  Administered 2017-07-04: 4 mg via INTRAVENOUS

## 2017-07-04 MED ORDER — PROPOFOL 10 MG/ML IV BOLUS
INTRAVENOUS | Status: AC
  Filled 2017-07-04: qty 40

## 2017-07-04 MED ORDER — ONDANSETRON 4 MG PO TBDP: 4.0000 mg | ORAL_TABLET | Freq: Four times a day (QID) | ORAL | Status: DC | PRN

## 2017-07-04 MED ORDER — SODIUM CHLORIDE 0.9 % IV SOLN
INTRAVENOUS | Status: DC
  Administered 2017-07-04: 20:00:00 via INTRAVENOUS

## 2017-07-04 MED ORDER — FAMOTIDINE 20 MG PO TABS
20.0000 mg | ORAL_TABLET | Freq: Two times a day (BID) | ORAL | Status: DC
  Administered 2017-07-04 – 2017-07-06 (×4): 20 mg via ORAL
  Filled 2017-07-04 (×4): qty 1

## 2017-07-04 MED ORDER — METOPROLOL SUCCINATE ER 25 MG PO TB24
12.5000 mg | ORAL_TABLET | Freq: Every day | ORAL | Status: DC
  Administered 2017-07-04 – 2017-07-06 (×3): 12.5 mg via ORAL
  Filled 2017-07-04 (×3): qty 1

## 2017-07-04 MED ORDER — SIMETHICONE 80 MG PO CHEW
40.0000 mg | CHEWABLE_TABLET | Freq: Four times a day (QID) | ORAL | Status: DC | PRN
Start: 2017-07-04 — End: 2017-07-06

## 2017-07-04 MED ORDER — FENTANYL CITRATE (PF) 100 MCG/2ML IJ SOLN: 25.0000 ug | INTRAMUSCULAR | Status: DC | PRN

## 2017-07-04 MED ORDER — PHENYLEPHRINE HCL 10 MG/ML IJ SOLN
INTRAMUSCULAR | Status: DC | PRN
  Administered 2017-07-04: 80 ug via INTRAVENOUS

## 2017-07-04 MED ORDER — FENTANYL CITRATE (PF) 250 MCG/5ML IJ SOLN
INTRAMUSCULAR | Status: AC
  Filled 2017-07-04: qty 5

## 2017-07-04 MED ORDER — PIPERACILLIN-TAZOBACTAM 3.375 G IVPB 30 MIN
3.3750 g | Freq: Once | INTRAVENOUS | Status: AC
  Administered 2017-07-04: 3.375 g via INTRAVENOUS
  Filled 2017-07-04: qty 50

## 2017-07-04 MED ORDER — ACETAMINOPHEN 325 MG PO TABS
650.0000 mg | ORAL_TABLET | Freq: Four times a day (QID) | ORAL | Status: DC | PRN
  Administered 2017-07-05: 650 mg via ORAL
  Filled 2017-07-04: qty 2

## 2017-07-04 MED ORDER — MIDAZOLAM HCL 5 MG/5ML IJ SOLN
INTRAMUSCULAR | Status: DC | PRN
  Administered 2017-07-04: 2 mg via INTRAVENOUS

## 2017-07-04 MED ORDER — OXYCODONE HCL 5 MG/5ML PO SOLN: 5.0000 mg | Freq: Once | ORAL | Status: DC | PRN

## 2017-07-04 MED ORDER — TRAMADOL HCL 50 MG PO TABS
50.0000 mg | ORAL_TABLET | Freq: Four times a day (QID) | ORAL | Status: DC | PRN
  Administered 2017-07-05 – 2017-07-06 (×2): 50 mg via ORAL
  Filled 2017-07-04 (×2): qty 1

## 2017-07-04 MED ORDER — LACTATED RINGERS IV SOLN
INTRAVENOUS | Status: DC | PRN
  Administered 2017-07-04: 19:00:00 via INTRAVENOUS

## 2017-07-04 MED ORDER — PROPOFOL 10 MG/ML IV BOLUS
INTRAVENOUS | Status: DC | PRN
  Administered 2017-07-04: 140 mg via INTRAVENOUS

## 2017-07-04 MED ORDER — OXYCODONE HCL 5 MG PO TABS: 5.0000 mg | ORAL_TABLET | Freq: Once | ORAL | Status: DC | PRN

## 2017-07-04 MED ORDER — IOHEXOL 300 MG/ML  SOLN
100.0000 mL | Freq: Once | INTRAMUSCULAR | Status: AC | PRN
  Administered 2017-07-04: 100 mL via INTRAVENOUS

## 2017-07-04 MED ORDER — ENOXAPARIN SODIUM 40 MG/0.4ML ~~LOC~~ SOLN
40.0000 mg | SUBCUTANEOUS | Status: DC
  Administered 2017-07-05 – 2017-07-06 (×2): 40 mg via SUBCUTANEOUS
  Filled 2017-07-04 (×2): qty 0.4

## 2017-07-04 MED ORDER — FENTANYL CITRATE (PF) 100 MCG/2ML IJ SOLN
INTRAMUSCULAR | Status: DC | PRN
  Administered 2017-07-04: 100 ug via INTRAVENOUS

## 2017-07-04 MED ORDER — ORAL CARE MOUTH RINSE
15.0000 mL | Freq: Two times a day (BID) | OROMUCOSAL | Status: DC
  Administered 2017-07-04 – 2017-07-05 (×3): 15 mL via OROMUCOSAL

## 2017-07-04 MED ORDER — MORPHINE SULFATE (PF) 2 MG/ML IV SOLN: 2.0000 mg | INTRAVENOUS | Status: DC | PRN

## 2017-07-04 MED ORDER — ONDANSETRON HCL 4 MG/2ML IJ SOLN: 4.0000 mg | Freq: Four times a day (QID) | INTRAMUSCULAR | Status: DC | PRN

## 2017-07-04 MED ORDER — MIDAZOLAM HCL 2 MG/2ML IJ SOLN
INTRAMUSCULAR | Status: AC
  Filled 2017-07-04: qty 2

## 2017-07-04 SURGICAL SUPPLY — 35 items
BRIEF STRETCH FOR OB PAD LRG (UNDERPADS AND DIAPERS) ×2 IMPLANT
CANISTER SUCT 3000ML PPV (MISCELLANEOUS) ×2 IMPLANT
COVER SURGICAL LIGHT HANDLE (MISCELLANEOUS) ×2 IMPLANT
DRSG PAD ABDOMINAL 8X10 ST (GAUZE/BANDAGES/DRESSINGS) ×2 IMPLANT
ELECT CAUTERY BLADE 6.4 (BLADE) ×2 IMPLANT
ELECT REM PT RETURN 9FT ADLT (ELECTROSURGICAL)
ELECTRODE REM PT RTRN 9FT ADLT (ELECTROSURGICAL) IMPLANT
GAUZE PACKING IODOFORM 1X5 (MISCELLANEOUS) ×2 IMPLANT
GAUZE SPONGE 4X4 12PLY STRL (GAUZE/BANDAGES/DRESSINGS) ×2 IMPLANT
GAUZE SPONGE 4X4 16PLY XRAY LF (GAUZE/BANDAGES/DRESSINGS) ×2 IMPLANT
GLOVE BIO SURGEON STRL SZ7 (GLOVE) ×2 IMPLANT
GLOVE BIOGEL PI IND STRL 7.5 (GLOVE) ×1 IMPLANT
GLOVE BIOGEL PI INDICATOR 7.5 (GLOVE) ×1
GOWN STRL REUS W/ TWL LRG LVL3 (GOWN DISPOSABLE) ×2 IMPLANT
GOWN STRL REUS W/TWL LRG LVL3 (GOWN DISPOSABLE) ×4
KIT BASIN OR (CUSTOM PROCEDURE TRAY) ×2 IMPLANT
KIT TURNOVER KIT B (KITS) ×2 IMPLANT
NEEDLE HYPO 25GX1X1/2 BEV (NEEDLE) ×2 IMPLANT
NS IRRIG 1000ML POUR BTL (IV SOLUTION) ×2 IMPLANT
PACK LITHOTOMY IV (CUSTOM PROCEDURE TRAY) IMPLANT
PACK SURGICAL SETUP 50X90 (CUSTOM PROCEDURE TRAY) IMPLANT
PAD ABD 8X10 STRL (GAUZE/BANDAGES/DRESSINGS) ×2 IMPLANT
PAD ARMBOARD 7.5X6 YLW CONV (MISCELLANEOUS) ×2 IMPLANT
PENCIL BUTTON HOLSTER BLD 10FT (ELECTRODE) ×2 IMPLANT
SPONGE LAP 18X18 X RAY DECT (DISPOSABLE) ×2 IMPLANT
SURGILUBE 2OZ TUBE FLIPTOP (MISCELLANEOUS) IMPLANT
SWAB COLLECTION DEVICE MRSA (MISCELLANEOUS) ×2 IMPLANT
SWAB CULTURE ESWAB REG 1ML (MISCELLANEOUS) ×4 IMPLANT
SYR CONTROL 10ML LL (SYRINGE) ×2 IMPLANT
TAPE CLOTH SURG 6X10 WHT LF (GAUZE/BANDAGES/DRESSINGS) ×2 IMPLANT
TOWEL OR 17X24 6PK STRL BLUE (TOWEL DISPOSABLE) ×2 IMPLANT
TOWEL OR 17X26 10 PK STRL BLUE (TOWEL DISPOSABLE) ×2 IMPLANT
TUBE CONNECTING 12X1/4 (SUCTIONS) ×2 IMPLANT
UNDERPAD 30X30 (UNDERPADS AND DIAPERS) ×2 IMPLANT
YANKAUER SUCT BULB TIP NO VENT (SUCTIONS) ×2 IMPLANT

## 2017-07-04 NOTE — Transfer of Care (Signed)
Immediate Anesthesia Transfer of Care Note  Patient: Jordan Jensen  Procedure(s) Performed: IRRIGATION AND DEBRIDEMENT PERIRECTAL ABSCESS (Left )  Patient Location: PACU  Anesthesia Type:General  Level of Consciousness: awake, alert  and oriented  Airway & Oxygen Therapy: Patient Spontanous Breathing and Patient connected to face mask oxygen  Post-op Assessment: Report given to RN and Post -op Vital signs reviewed and stable  Post vital signs: Reviewed and stable  Last Vitals:  Vitals Value Taken Time  BP 160/80 07/04/2017  7:43 PM  Temp    Pulse 45 07/04/2017  7:43 PM  Resp 18 07/04/2017  7:43 PM  SpO2 95 % 07/04/2017  7:43 PM  Vitals shown include unvalidated device data.  Last Pain:  Vitals:   07/04/17 1357  TempSrc:   PainSc: 10-Worst pain ever         Complications: No apparent anesthesia complications

## 2017-07-04 NOTE — Anesthesia Procedure Notes (Signed)
Procedure Name: Intubation Date/Time: 07/04/2017 7:06 PM Performed by: Babs Bertin, CRNA Pre-anesthesia Checklist: Patient identified, Emergency Drugs available, Suction available and Patient being monitored Patient Re-evaluated:Patient Re-evaluated prior to induction Oxygen Delivery Method: Circle System Utilized Preoxygenation: Pre-oxygenation with 100% oxygen Induction Type: IV induction and Rapid sequence Laryngoscope Size: Mac and 4 Grade View: Grade I Tube type: Oral Tube size: 7.5 mm Number of attempts: 1 Airway Equipment and Method: Stylet and Oral airway Placement Confirmation: ETT inserted through vocal cords under direct vision,  positive ETCO2 and breath sounds checked- equal and bilateral Secured at: 23 cm Tube secured with: Tape Dental Injury: Teeth and Oropharynx as per pre-operative assessment

## 2017-07-04 NOTE — Anesthesia Preprocedure Evaluation (Signed)
Anesthesia Evaluation  Patient identified by MRN, date of birth, ID band Patient awake    Reviewed: Allergy & Precautions, NPO status , Patient's Chart, lab work & pertinent test results  History of Anesthesia Complications Negative for: history of anesthetic complications  Airway Mallampati: II  TM Distance: >3 FB Neck ROM: Full    Dental  (+) Partial Upper,    Pulmonary shortness of breath, sleep apnea , former smoker,  Unworked up OSA   breath sounds clear to auscultation       Cardiovascular hypertension, Pt. on medications and Pt. on home beta blockers +CHF   Rhythm:Regular Rate:Tachycardia     Neuro/Psych Previous c spine injury with hardware and mild LE weakness  Neuromuscular disease negative psych ROS   GI/Hepatic Neg liver ROS, GERD  ,  Endo/Other  Morbid obesity  Renal/GU Renal InsufficiencyRenal disease     Musculoskeletal   Abdominal   Peds  Hematology   Anesthesia Other Findings   Reproductive/Obstetrics                             Anesthesia Physical Anesthesia Plan  ASA: IV  Anesthesia Plan: General   Post-op Pain Management:    Induction: Intravenous  PONV Risk Score and Plan: 2 and Ondansetron and Treatment may vary due to age or medical condition  Airway Management Planned: Oral ETT  Additional Equipment: None  Intra-op Plan:   Post-operative Plan: Extubation in OR  Informed Consent: I have reviewed the patients History and Physical, chart, labs and discussed the procedure including the risks, benefits and alternatives for the proposed anesthesia with the patient or authorized representative who has indicated his/her understanding and acceptance.   Dental advisory given  Plan Discussed with: CRNA and Surgeon  Anesthesia Plan Comments:         Anesthesia Quick Evaluation
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
## 2017-07-04 NOTE — ED Notes (Signed)
Patient transported to CT 
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
## 2017-07-04 NOTE — ED Notes (Signed)
Consent obtained for surgery.

## 2017-07-04 NOTE — Anesthesia Postprocedure Evaluation (Signed)
Anesthesia Post Note  Patient: Jordan Jensen  Procedure(s) Performed: IRRIGATION AND DEBRIDEMENT PERIRECTAL ABSCESS (Left )     Patient location during evaluation: PACU Anesthesia Type: General Level of consciousness: awake Pain management: pain level controlled Vital Signs Assessment: post-procedure vital signs reviewed and stable Respiratory status: spontaneous breathing Cardiovascular status: stable Anesthetic complications: no    Last Vitals:  Vitals:   07/04/17 2015 07/04/17 2030  BP: 127/61 118/61  Pulse: (!) 102 (!) 35  Resp: (!) 31 (!) 23  Temp:    SpO2: 96% 95%    Last Pain:  Vitals:   07/04/17 2000  TempSrc:   PainSc: 0-No pain                 Glynis Hunsucker

## 2017-07-04 NOTE — Op Note (Signed)
Preoperative diagnosis: Left perianal abscess Postoperative diagnosis: Same as above Procedure: Incision and drainage of left perianal abscess Surgeon: Dr. Harden Mo Anesthesia: General Estimated blood loss: 30 cc Specimens: Cultures to microbiology Complications: None Drains: None Sponge needle count was correct at completion Disposition to recovery in stable condition  Indications: This is a 47 year old male with multiple comorbidities who earlier this year had a similar appearing abscess that was drained in the emergency room.  He has had recurrence over the last week.  CT scan shows a 5 x 2 cm abscess in fairly similar position to the last one.  I was unable to really examine him adequately in the emergency room.  This area was very tender and I did not think I was going to be able to drain it in the ER.  He had had a lot of trouble with the last one and there is scar tissue present as well.  I discussed going to the operating room.  Procedure: After informed consent was obtained the patient was taken to the operating room.  He had SCDs.  He had antibiotics ordered.  He was placed under anesthesia.  He was rolled on the left lateral decubitus position.  He was prepped and draped.  Timeout was then performed.  I made a radial incision overlying the abscess.  I entered into it and the skin above it really just fell apart at that point.  There is a fairly large hole present.  There was a fair amount of purulence that was drained.  There was no more infection present once I was done.  I took cultures.  I then packed this wound with iodoform.  Dressings were placed.  He tolerated this well was extubated and transferred to recovery.  I am going to leave him in the stepdown unit overnight due to his sleep apnea that he still has not had evaluated yet.

## 2017-07-04 NOTE — ED Triage Notes (Signed)
Pt presents to ED for assessment of general malaise since Monday, with a growing lump to his buttocks.  Patient now c/o fever, took tylenol this morning.  States hx of perirectal abscess.

## 2017-07-05 ENCOUNTER — Other Ambulatory Visit: Payer: Self-pay

## 2017-07-05 ENCOUNTER — Encounter (HOSPITAL_COMMUNITY): Payer: Self-pay | Admitting: General Surgery

## 2017-07-05 LAB — CBC
HCT: 43.7 % (ref 39.0–52.0)
Hemoglobin: 13.7 g/dL (ref 13.0–17.0)
MCH: 29.1 pg (ref 26.0–34.0)
MCHC: 31.4 g/dL (ref 30.0–36.0)
MCV: 92.8 fL (ref 78.0–100.0)
PLATELETS: 169 10*3/uL (ref 150–400)
RBC: 4.71 MIL/uL (ref 4.22–5.81)
RDW: 12.9 % (ref 11.5–15.5)
WBC: 16.1 10*3/uL — AB (ref 4.0–10.5)

## 2017-07-05 LAB — BASIC METABOLIC PANEL
Anion gap: 6 (ref 5–15)
BUN: 7 mg/dL (ref 6–20)
CALCIUM: 8.8 mg/dL — AB (ref 8.9–10.3)
CO2: 30 mmol/L (ref 22–32)
Chloride: 99 mmol/L (ref 98–111)
Creatinine, Ser: 0.93 mg/dL (ref 0.61–1.24)
GFR calc Af Amer: >60 mL/min (ref >60)
GLUCOSE: 102 mg/dL — AB (ref 70–99)
Potassium: 4.3 mmol/L (ref 3.5–5.1)
SODIUM: 135 mmol/L (ref 135–145)

## 2017-07-05 LAB — GLUCOSE, CAPILLARY: Glucose-Capillary: 144 mg/dL — ABNORMAL HIGH (ref 70–99)

## 2017-07-05 MED ORDER — SODIUM CHLORIDE 0.9% FLUSH: 3.0000 mL | INTRAVENOUS | Status: DC | PRN

## 2017-07-05 MED ORDER — SODIUM CHLORIDE 0.9 % IV SOLN
250.0000 mL | INTRAVENOUS | Status: DC | PRN
  Administered 2017-07-05: 250 mL via INTRAVENOUS

## 2017-07-05 MED ORDER — SODIUM CHLORIDE 0.9% FLUSH
3.0000 mL | Freq: Two times a day (BID) | INTRAVENOUS | Status: DC
  Administered 2017-07-05: 3 mL via INTRAVENOUS

## 2017-07-05 NOTE — Progress Notes (Signed)
[facility] Progress Note 1 Day Post-Op  Subjective: Patient is awake, alert and oriented.  Noted that the patient was having frequently unifocal PVC's on monitor.  No distress.  Not to remvoe the packing until tomorrow.  Objective: Vital signs in last 24 hours: Temp:  [97.2 F (36.2 C)-99.4 F (37.4 C)] 99.1 F (37.3 C) (06/30 0800) Pulse Rate:  [31-104] 93 (06/30 0600) Resp:  [11-31] 30 (06/30 0600) BP: (111-160)/(53-103) 120/94 (06/30 0600) SpO2:  [92 %-99 %] 94 % (06/30 0600) Weight:  [192.8 kg (425 lb)] 192.8 kg (425 lb) (06/29 1604) Last BM Date: (PTA)  Intake/Output from previous day: 06/29 0701 - 06/30 0700 In: 1091.4 [I.V.:1091.4] Out: 605 [Urine:600; Blood:5] Intake/Output this shift: No intake/output data recorded.  General: No distress.  Lungs: Clear.  Oxygen saturations are good  Abd: Benign  Extremities: No changes  Neuro: Intact  Lab Results:  @LABLAST2 (wbc:2,hgb:2,hct:2,plt:2) BMET ) Recent Labs    07/04/17 1407 07/05/17 0706  NA 135 135  K 4.1 4.3  CL 98 99  CO2 27 30  GLUCOSE 101* 102*  BUN 11 7  CREATININE 1.12 0.93  CALCIUM 9.2 8.8*   PT/INR No results for input(s): LABPROT, INR in the last 72 hours. ABG No results for input(s): PHART, HCO3 in the last 72 hours.  Invalid input(s): PCO2, PO2  Studies/Results: Ct Abdomen Pelvis W Contrast  Result Date: 07/04/2017 CLINICAL DATA:  History of perirectal abscess. Growing buttock lump. EXAM: CT ABDOMEN AND PELVIS WITH CONTRAST TECHNIQUE: Multidetector CT imaging of the abdomen and pelvis was performed using the standard protocol following bolus administration of intravenous contrast. CONTRAST:  OMNIPAQUE IOHEXOL 300 MG/ML  SOLN COMPARISON:  02/09/2017 FINDINGS: Lower chest: No acute abnormality. Hepatobiliary: 16 mm hyperechoic subcapsular hepatic perfusion abnormality. No gallstones, gallbladder wall thickening, or biliary dilatation. Pancreas: Unremarkable. No pancreatic ductal  dilatation or surrounding inflammatory changes. Spleen: Normal in size without focal abnormality. Adrenals/Urinary Tract: Adrenal glands are unremarkable. Kidneys are normal, without renal calculi, focal lesion, or hydronephrosis. Bladder is unremarkable. Stomach/Bowel: Stomach is within normal limits. Appendix appears normal. No evidence of bowel wall thickening, distention, or inflammatory changes. Vascular/Lymphatic: Aortic atherosclerosis. Several enlarged lymph nodes along the left iliac chain and left more than right inguinal region. Reproductive: Prostate is unremarkable. Other: Bilateral fat containing inguinal hernias. Previously demonstrated left posterior perirectal abscess has increased and measures 5.1 x 1.7 cm. Musculoskeletal: No acute or significant osseous findings. IMPRESSION: Interval enlargement of the previously demonstrated left posterior perirectal abscess, which now measures 5.1 x 1.7 cm. Lymphadenopathy along the left iliac chain and within the bilateral inguinal regions, left greater than right, may be reactive given the presence of the abscess. Bilateral fat containing inguinal hernias. Electronically Signed   By: Ted Mcalpine M.D.   On: 07/04/2017 17:04    Anti-infectives: Anti-infectives (From admission, onward)   Start     Dose/Rate Route Frequency Ordered Stop   07/05/17 0400  piperacillin-tazobactam (ZOSYN) IVPB 3.375 g     3.375 g 12.5 mL/hr over 240 Minutes Intravenous Every 8 hours 07/04/17 1913     07/04/17 2000  piperacillin-tazobactam (ZOSYN) IVPB 3.375 g     3.375 g 100 mL/hr over 30 Minutes Intravenous  Once 07/04/17 1913 07/04/17 2035      Assessment/Plan: s/p Procedure(s): IRRIGATION AND DEBRIDEMENT PERIRECTAL ABSCESS Transfer to the floor with telemetry.  LOS: 1 day   Marta Lamas. Gae Bon, MD, FACS [phone] [facility] 07/05/2017

## 2017-07-05 NOTE — Plan of Care (Signed)
Patient comfortable being helped turn in bed.  No complaints of pain when pressure off periop are.   Dressing checked, no drainage through dressing.  Pt tolerating eating, CBG post meal 140s, SCDs in place.  Received IV Zosyn.  Wife at bedside.

## 2017-07-06 LAB — GLUCOSE, CAPILLARY: GLUCOSE-CAPILLARY: 108 mg/dL — AB (ref 70–99)

## 2017-07-06 MED ORDER — TRAMADOL HCL 50 MG PO TABS: 50.0000 mg | ORAL_TABLET | Freq: Four times a day (QID) | ORAL | 0 refills | Status: DC | PRN

## 2017-07-06 MED ORDER — AMOXICILLIN-POT CLAVULANATE 875-125 MG PO TABS: 1.0000 | ORAL_TABLET | Freq: Two times a day (BID) | ORAL | 0 refills | Status: AC

## 2017-07-06 NOTE — Discharge Summary (Signed)
[facility] Discharge Summary   Patient ID: Jordan Jensen MRN: [id number] DOB/AGE: 1970-12-30 47 y.o.  Admit date: 07/04/2017 Discharge date: 07/06/2017  Admitting Diagnosis: Perirectal abscess  Discharge Diagnosis Patient Active Problem List   Diagnosis Date Noted  . Perirectal abscess 07/04/2017  . Anal abscess 02/09/2017  . Pulmonary nodule, left 09/17/2016  . Quadriplegia and quadriparesis (HCC)   . Essential hypertension   . CHF NYHA class III (HCC)   . Low back pain 03/23/2013  . Spinal cord injury at C5-C7 level without injury of spinal bone (HCC) 05/03/2012  . Lymphedema 11/07/2011  . Morbid obesity (HCC) 07/05/2011  . Snoring 07/05/2011  . Bilateral leg edema   . Post traumatic myelopathy [facility])     Consultants None  Imaging: Ct Abdomen Pelvis W Contrast  Result Date: 07/04/2017 CLINICAL DATA:  History of perirectal abscess. Growing buttock lump. EXAM: CT ABDOMEN AND PELVIS WITH CONTRAST TECHNIQUE: Multidetector CT imaging of the abdomen and pelvis was performed using the standard protocol following bolus administration of intravenous contrast. CONTRAST:  OMNIPAQUE IOHEXOL 300 MG/ML  SOLN COMPARISON:  02/09/2017 FINDINGS: Lower chest: No acute abnormality. Hepatobiliary: 16 mm hyperechoic subcapsular hepatic perfusion abnormality. No gallstones, gallbladder wall thickening, or biliary dilatation. Pancreas: Unremarkable. No pancreatic ductal dilatation or surrounding inflammatory changes. Spleen: Normal in size without focal abnormality. Adrenals/Urinary Tract: Adrenal glands are unremarkable. Kidneys are normal, without renal calculi, focal lesion, or hydronephrosis. Bladder is unremarkable. Stomach/Bowel: Stomach is within normal limits. Appendix appears normal. No evidence of bowel wall thickening, distention, or inflammatory changes. Vascular/Lymphatic: Aortic atherosclerosis. Several enlarged lymph nodes along the left iliac chain and left more than  right inguinal region. Reproductive: Prostate is unremarkable. Other: Bilateral fat containing inguinal hernias. Previously demonstrated left posterior perirectal abscess has increased and measures 5.1 x 1.7 cm. Musculoskeletal: No acute or significant osseous findings. IMPRESSION: Interval enlargement of the previously demonstrated left posterior perirectal abscess, which now measures 5.1 x 1.7 cm. Lymphadenopathy along the left iliac chain and within the bilateral inguinal regions, left greater than right, may be reactive given the presence of the abscess. Bilateral fat containing inguinal hernias. Electronically Signed   By: Ted Mcalpine M.D.   On: 07/04/2017 17:04    Procedures Dr. Dwain Sarna (07/04/17) - Incision and drainage of left perianal abscess  Hospital Course:  Jordan Jensen is a 47yo male PMH obesity and paraplegia, who presented to [facility] 6/29 with 1 week of perianal pain. He was in the hospital earlier this year for perianal abscess that was drained at bedside.  Workup this admission included a CT scan which showed a left sided 2.8cm recurrent perianal abscess.  Patient was admitted and underwent procedure listed above.  Tolerated procedure well and was transferred to the floor.  Packing was removed POD2. On POD2 the patient was voiding well, tolerating diet, pain well controlled, vital signs stable and felt stable for discharge home.  He will go home with 3 days of augmentin to complete a 5 day course of antibiotics, and will perform sitz baths/flush wound in the shower. Patient will follow up as below and knows to call with questions or concerns.   Patient will need follow up with colorectal surgeon once this wound heals due to recurrence of perianal abscess.  I have personally reviewed the patients medication history on the [place] controlled substance database.    Physical Exam: General:  Alert, NAD, pleasant, comfortable Pulm: effort normal Abd:  obese, soft, ND, NT GU: perianal  abscess s/p I&D with packing in place, no purulent drainage on dressing, wound difficult to see well due to body habitus >> packing removed  Allergies as of 07/06/2017      Reactions   Albuterol Shortness Of Breath, Swelling   Ace Inhibitors Cough   Latex Itching, Rash   cellulitis      Medication List    STOP taking these medications   cephALEXin 500 MG capsule Commonly known as:  KEFLEX   doxycycline 100 MG capsule Commonly known as:  VIBRAMYCIN   HYDROcodone-acetaminophen 5-325 MG tablet Commonly known as:  NORCO/VICODIN     TAKE these medications   acetaminophen 325 MG tablet Commonly known as:  TYLENOL Take 2 tablets (650 mg total) by mouth every 6 (six) hours as needed for mild pain (or Fever >/= 101).   amoxicillin-clavulanate 875-125 MG tablet Commonly known as:  AUGMENTIN Take 1 tablet by mouth 2 (two) times daily for 3 days.   aspirin EC 81 MG tablet Take 81 mg by mouth daily.   famotidine 20 MG tablet Commonly known as:  PEPCID Take 20 mg by mouth daily as needed for heartburn or indigestion.   furosemide 40 MG tablet Commonly known as:  LASIX Take 1 tablet (40 mg total) by mouth daily. What changed:    when to take this  reasons to take this   MENS ONE DAILY PO Take 1 tablet by mouth daily.   metoprolol succinate 25 MG 24 hr tablet Commonly known as:  TOPROL-XL Take 0.5 tablets (12.5 mg total) by mouth daily.   traMADol 50 MG tablet Commonly known as:  ULTRAM Take 1 tablet (50 mg total) by mouth every 6 (six) hours as needed for severe pain. What changed:  reasons to take this        Follow-up Information    [facility] Go on 07/16/2017.   Specialty:  General Surgery Why:  Your appointment is 07/16/17 at 3:30PM. Please arrive 30 minutes prior to your appointment to check in and fill out paperwork. Bring photo ID and insurance information. Contact information: [address] [place]  [address] [phone]          Signed: Franne Forts, [facility] 07/06/2017, 9:53 AM Pager: [phone] Consults: [phone] Mon 7:00 am -11:30 AM Tues-Fri 7:00 am-4:30 pm Sat-Sun 7:00 am-11:30 am

## 2017-07-06 NOTE — Discharge Instructions (Signed)
Shower daily and after every bowel movement. Recommend getting in shower and using the shower head to flush out wound well. Take antibiotics as prescribed. Wound will drain for several days, may need mesh underwear or pad to catch drainage but the wound does not need to be re-packed with gauze Call with concerns (ie fevers, increased pain at surgical site...) You may want to take a stool softener/laxative to avoid getting constipated.

## 2017-07-06 NOTE — Progress Notes (Signed)
Discharge instructions reviewed with the Pt and family member - Pt given discharge instructions, and is aware of appointment for f/u and where to get his prescriptions. Pt discharged home with is family member

## 2017-07-09 LAB — AEROBIC/ANAEROBIC CULTURE W GRAM STAIN (SURGICAL/DEEP WOUND)

## 2017-07-09 LAB — AEROBIC/ANAEROBIC CULTURE (SURGICAL/DEEP WOUND): CULTURE: NORMAL

## 2017-08-12 DIAGNOSIS — K603 Anal fistula: Secondary | ICD-10-CM | POA: Diagnosis not present

## 2017-08-30 ENCOUNTER — Other Ambulatory Visit: Payer: Self-pay

## 2017-08-30 ENCOUNTER — Ambulatory Visit (HOSPITAL_COMMUNITY)
Admission: EM | Admit: 2017-08-30 | Discharge: 2017-08-30 | Disposition: A | Discharge status: Home / Self Care | Payer: Medicare HMO | Attending: Internal Medicine | Admitting: Internal Medicine

## 2017-08-30 ENCOUNTER — Encounter (HOSPITAL_COMMUNITY): Payer: Self-pay

## 2017-08-30 DIAGNOSIS — Z87891 Personal history of nicotine dependence: Secondary | ICD-10-CM | POA: Insufficient documentation

## 2017-08-30 DIAGNOSIS — Z79899 Other long term (current) drug therapy: Secondary | ICD-10-CM | POA: Diagnosis not present

## 2017-08-30 DIAGNOSIS — K219 Gastro-esophageal reflux disease without esophagitis: Secondary | ICD-10-CM | POA: Insufficient documentation

## 2017-08-30 DIAGNOSIS — N390 Urinary tract infection, site not specified: Secondary | ICD-10-CM | POA: Diagnosis not present

## 2017-08-30 DIAGNOSIS — Z7982 Long term (current) use of aspirin: Secondary | ICD-10-CM | POA: Insufficient documentation

## 2017-08-30 DIAGNOSIS — I110 Hypertensive heart disease with heart failure: Secondary | ICD-10-CM | POA: Diagnosis not present

## 2017-08-30 DIAGNOSIS — I509 Heart failure, unspecified: Secondary | ICD-10-CM | POA: Insufficient documentation

## 2017-08-30 DIAGNOSIS — Z993 Dependence on wheelchair: Secondary | ICD-10-CM | POA: Diagnosis not present

## 2017-08-30 LAB — POCT URINALYSIS DIP (DEVICE)
Glucose, UA: NEGATIVE mg/dL
KETONES UR: NEGATIVE mg/dL
Nitrite: NEGATIVE
PH: 5 (ref 5.0–8.0)
PROTEIN: NEGATIVE mg/dL
Urobilinogen, UA: 0.2 mg/dL (ref 0.0–1.0)

## 2017-08-30 MED ORDER — CEPHALEXIN 500 MG PO CAPS: 500.0000 mg | ORAL_CAPSULE | Freq: Four times a day (QID) | ORAL | 0 refills | Status: AC

## 2017-08-30 NOTE — Discharge Instructions (Signed)
Urine concerning for urinary tract infection.  I have sent antibiotics to the pharmacy, please complete.  Drink plenty of water to empty bladder regularly. Avoid alcohol and caffeine as these may irritate the bladder.   Please follow up with your primary care provider in the next week for recheck.  If develop any worsening of symptoms, fevers, chills, pain, blood in urine please return or go to the Er.

## 2017-08-30 NOTE — ED Triage Notes (Signed)
Pt states he has odor and has been voiding a lot. X 4 days. Pressure while voiding.
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
## 2017-08-30 NOTE — ED Notes (Signed)
Given instructions on obtaining urine specimens.  Provided a urinal.

## 2017-08-31 LAB — URINE CYTOLOGY ANCILLARY ONLY
Chlamydia: NEGATIVE
Neisseria Gonorrhea: NEGATIVE
TRICH (WINDOWPATH): NEGATIVE

## 2017-09-01 ENCOUNTER — Telehealth (HOSPITAL_COMMUNITY): Payer: Self-pay

## 2017-09-01 LAB — URINE CULTURE: Culture: >=100000 — AB

## 2017-09-01 NOTE — Telephone Encounter (Signed)
Urine culture positive for Klebsiella Pneumoniae. Treated with Keflex at [facility] visit. Patient called and made aware.

## 2017-10-13 ENCOUNTER — Ambulatory Visit: Payer: Medicare HMO | Admitting: Family Medicine

## 2017-10-21 ENCOUNTER — Ambulatory Visit: Payer: Medicare HMO | Admitting: Family Medicine

## 2017-10-28 ENCOUNTER — Ambulatory Visit (HOSPITAL_COMMUNITY)
Admission: EM | Admit: 2017-10-28 | Discharge: 2017-10-28 | Disposition: A | Discharge status: Home / Self Care | Payer: Medicare HMO | Attending: Family Medicine | Admitting: Family Medicine

## 2017-10-28 ENCOUNTER — Encounter (HOSPITAL_COMMUNITY): Payer: Self-pay | Admitting: Emergency Medicine

## 2017-10-28 ENCOUNTER — Other Ambulatory Visit: Payer: Self-pay

## 2017-10-28 ENCOUNTER — Ambulatory Visit: Payer: Medicare HMO | Admitting: Family Medicine

## 2017-10-28 DIAGNOSIS — J0410 Acute tracheitis without obstruction: Secondary | ICD-10-CM

## 2017-10-28 DIAGNOSIS — R05 Cough: Secondary | ICD-10-CM | POA: Diagnosis not present

## 2017-10-28 DIAGNOSIS — R350 Frequency of micturition: Secondary | ICD-10-CM | POA: Diagnosis present

## 2017-10-28 DIAGNOSIS — N390 Urinary tract infection, site not specified: Secondary | ICD-10-CM

## 2017-10-28 DIAGNOSIS — M549 Dorsalgia, unspecified: Secondary | ICD-10-CM | POA: Diagnosis present

## 2017-10-28 DIAGNOSIS — I890 Lymphedema, not elsewhere classified: Secondary | ICD-10-CM

## 2017-10-28 DIAGNOSIS — R059 Cough, unspecified: Secondary | ICD-10-CM

## 2017-10-28 LAB — POCT URINALYSIS DIP (DEVICE)
Bilirubin Urine: NEGATIVE
Glucose, UA: NEGATIVE mg/dL
KETONES UR: NEGATIVE mg/dL
Nitrite: NEGATIVE
PROTEIN: 30 mg/dL — AB
SPECIFIC GRAVITY, URINE: 1.02 (ref 1.005–1.030)
Urobilinogen, UA: 1 mg/dL (ref 0.0–1.0)
pH: 6.5 (ref 5.0–8.0)

## 2017-10-28 MED ORDER — MONTELUKAST SODIUM 10 MG PO TABS: 10.0000 mg | ORAL_TABLET | Freq: Every day | ORAL | 1 refills | Status: DC

## 2017-10-28 MED ORDER — FUROSEMIDE 40 MG PO TABS: 40.0000 mg | ORAL_TABLET | Freq: Every day | ORAL | 6 refills | Status: DC | PRN

## 2017-10-28 MED ORDER — CIPROFLOXACIN HCL 500 MG PO TABS
500.0000 mg | ORAL_TABLET | Freq: Once | ORAL | Status: AC
  Administered 2017-10-28: 500 mg via ORAL

## 2017-10-28 MED ORDER — CIPROFLOXACIN HCL 500 MG PO TABS
ORAL_TABLET | ORAL | Status: AC
  Filled 2017-10-28: qty 1

## 2017-10-28 MED ORDER — CIPROFLOXACIN HCL 500 MG PO TABS: 500.0000 mg | ORAL_TABLET | Freq: Two times a day (BID) | ORAL | 0 refills | Status: DC

## 2017-10-28 NOTE — Discharge Instructions (Addendum)
Have your personal care provider recheck your urine in 1 week.

## 2017-10-28 NOTE — ED Triage Notes (Addendum)
Pt reports lower back pain, urinary frequency, joint pain and chills for almost one week.  Pt also reports post nasal drip and cough x1 month.  Pt was supposed to see his PCP today but stated he didn't feel well enough to go see him.  Pt reports taking 650 mg of tylenol at 1530 today.
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
## 2017-10-31 LAB — URINE CULTURE: Culture: >=100000 — AB

## 2017-11-02 ENCOUNTER — Telehealth (HOSPITAL_COMMUNITY): Payer: Self-pay

## 2017-11-18 DIAGNOSIS — K603 Anal fistula: Secondary | ICD-10-CM | POA: Diagnosis not present

## 2017-11-28 ENCOUNTER — Encounter (HOSPITAL_COMMUNITY): Payer: Self-pay | Admitting: Emergency Medicine

## 2017-11-28 ENCOUNTER — Other Ambulatory Visit: Payer: Self-pay

## 2017-11-28 ENCOUNTER — Ambulatory Visit (INDEPENDENT_AMBULATORY_CARE_PROVIDER_SITE_OTHER)
Admission: EM | Admit: 2017-11-28 | Discharge: 2017-11-28 | Disposition: A | Discharge status: Home / Self Care | Payer: Medicare HMO | Source: Home / Self Care | Attending: Family Medicine | Admitting: Family Medicine

## 2017-11-28 ENCOUNTER — Ambulatory Visit (HOSPITAL_COMMUNITY): Payer: Medicare HMO

## 2017-11-28 ENCOUNTER — Emergency Department (HOSPITAL_COMMUNITY)
Admission: EM | Admit: 2017-11-28 | Discharge: 2017-11-28 | Disposition: A | Discharge status: Home / Self Care | Payer: Medicare HMO | Attending: Emergency Medicine | Admitting: Emergency Medicine

## 2017-11-28 ENCOUNTER — Emergency Department (HOSPITAL_COMMUNITY): Payer: Medicare HMO

## 2017-11-28 ENCOUNTER — Encounter (HOSPITAL_COMMUNITY): Payer: Self-pay

## 2017-11-28 DIAGNOSIS — Z91040 Latex allergy status: Secondary | ICD-10-CM | POA: Diagnosis not present

## 2017-11-28 DIAGNOSIS — J189 Pneumonia, unspecified organism: Secondary | ICD-10-CM | POA: Diagnosis not present

## 2017-11-28 DIAGNOSIS — Z7982 Long term (current) use of aspirin: Secondary | ICD-10-CM | POA: Insufficient documentation

## 2017-11-28 DIAGNOSIS — Z87891 Personal history of nicotine dependence: Secondary | ICD-10-CM | POA: Insufficient documentation

## 2017-11-28 DIAGNOSIS — R0602 Shortness of breath: Secondary | ICD-10-CM | POA: Diagnosis not present

## 2017-11-28 DIAGNOSIS — I5020 Unspecified systolic (congestive) heart failure: Secondary | ICD-10-CM

## 2017-11-28 DIAGNOSIS — Z79899 Other long term (current) drug therapy: Secondary | ICD-10-CM | POA: Insufficient documentation

## 2017-11-28 DIAGNOSIS — R05 Cough: Secondary | ICD-10-CM

## 2017-11-28 DIAGNOSIS — I509 Heart failure, unspecified: Secondary | ICD-10-CM | POA: Insufficient documentation

## 2017-11-28 DIAGNOSIS — I110 Hypertensive heart disease with heart failure: Secondary | ICD-10-CM | POA: Diagnosis not present

## 2017-11-28 DIAGNOSIS — R059 Cough, unspecified: Secondary | ICD-10-CM

## 2017-11-28 LAB — BASIC METABOLIC PANEL
Anion gap: 9 (ref 5–15)
BUN: 6 mg/dL (ref 6–20)
CALCIUM: 9.1 mg/dL (ref 8.9–10.3)
CHLORIDE: 100 mmol/L (ref 98–111)
CO2: 26 mmol/L (ref 22–32)
Creatinine, Ser: 0.94 mg/dL (ref 0.61–1.24)
GFR calc Af Amer: >60 mL/min (ref >60)
GFR calc non Af Amer: >60 mL/min (ref >60)
GLUCOSE: 109 mg/dL — AB (ref 70–99)
POTASSIUM: 3.5 mmol/L (ref 3.5–5.1)
Sodium: 135 mmol/L (ref 135–145)

## 2017-11-28 LAB — CBC WITH DIFFERENTIAL/PLATELET
ABS IMMATURE GRANULOCYTES: 0.06 10*3/uL (ref 0.00–0.07)
BASOS ABS: 0.1 10*3/uL (ref 0.0–0.1)
Basophils Relative: 0 %
Eosinophils Absolute: 0.4 10*3/uL (ref 0.0–0.5)
Eosinophils Relative: 3 %
HEMATOCRIT: 45 % (ref 39.0–52.0)
HEMOGLOBIN: 14.5 g/dL (ref 13.0–17.0)
IMMATURE GRANULOCYTES: 1 %
LYMPHS ABS: 2.3 10*3/uL (ref 0.7–4.0)
LYMPHS PCT: 18 %
MCH: 29.2 pg (ref 26.0–34.0)
MCHC: 32.2 g/dL (ref 30.0–36.0)
MCV: 90.5 fL (ref 80.0–100.0)
MONOS PCT: 8 %
Monocytes Absolute: 1 10*3/uL (ref 0.1–1.0)
NEUTROS ABS: 8.5 10*3/uL — AB (ref 1.7–7.7)
NEUTROS PCT: 70 %
Platelets: 163 10*3/uL (ref 150–400)
RBC: 4.97 MIL/uL (ref 4.22–5.81)
RDW: 12.5 % (ref 11.5–15.5)
WBC: 12.3 10*3/uL — ABNORMAL HIGH (ref 4.0–10.5)
nRBC: 0 % (ref 0.0–0.2)

## 2017-11-28 LAB — I-STAT TROPONIN, ED: TROPONIN I, POC: 0.04 ng/mL (ref 0.00–0.08)

## 2017-11-28 LAB — BRAIN NATRIURETIC PEPTIDE: B Natriuretic Peptide: 54.7 pg/mL (ref 0.0–100.0)

## 2017-11-28 MED ORDER — AMOXICILLIN-POT CLAVULANATE 875-125 MG PO TABS: 1.0000 | ORAL_TABLET | Freq: Two times a day (BID) | ORAL | 0 refills | Status: DC

## 2017-11-28 MED ORDER — AMOXICILLIN-POT CLAVULANATE 875-125 MG PO TABS
1.0000 | ORAL_TABLET | Freq: Once | ORAL | Status: AC
  Administered 2017-11-28: 1 via ORAL
  Filled 2017-11-28: qty 1

## 2017-11-28 MED ORDER — AZITHROMYCIN 250 MG PO TABS: ORAL_TABLET | ORAL | 0 refills | Status: DC

## 2017-11-28 MED ORDER — BENZONATATE 100 MG PO CAPS: 100.0000 mg | ORAL_CAPSULE | Freq: Three times a day (TID) | ORAL | 0 refills | Status: DC

## 2017-11-28 MED ORDER — AZITHROMYCIN 250 MG PO TABS
500.0000 mg | ORAL_TABLET | Freq: Once | ORAL | Status: AC
  Administered 2017-11-28: 500 mg via ORAL
  Filled 2017-11-28: qty 2

## 2017-11-28 NOTE — ED Provider Notes (Signed)
Patient: Jordan Jensen MRN: [id number] DOB: [birth year] PCP: [name], DO     Subjective:  Chief Complaint  Patient presents with  . URI    HPI: The patient is a 47 y.o. male who presents today for URI. He states last Friday he was around his granddaughter who had nausea/vomiting and a headache. He kissed her and thinks he may have what she had. He started to feel bad last Saturday with cough and he feels like his cough has gotten worse throughout the week. Cough is productive and mucous is green in color. He has been taking cough drops and cough syrup. He states he feels a little bit better today. He has run some low grade fevers. He had some wheezing, but no longer has this. He has no shortness of breath. He does not smoke. He is dependent on a wheelchair and has been sitting for the past week. He has a history of CHF, but looks like he has not been seen by cardiology since 01/2016. He has put on weight. He sleeps in a recliner so can't tell me if worsening orthopnea, but sounds like he has baseline orthopnea.  Last echo was in 01/2016 and has not seen cardiology in some time. EF was 40% with diffuse hypokinesis.   Review of Systems  Constitutional: Negative for chills, fatigue and fever.  HENT: Positive for ear pain, rhinorrhea and sore throat. Negative for congestion, sinus pressure, sinus pain and sneezing.   Eyes: Negative for itching.  Respiratory: Positive for cough. Negative for shortness of breath and wheezing.   Cardiovascular: Positive for leg swelling (chronic issue, at baseline. ). Negative for chest pain.  Gastrointestinal: Negative for abdominal pain, diarrhea, nausea and vomiting.    Allergies Patient is allergic to albuterol; ace inhibitors; and latex.  Past Medical History Patient  has a past medical history of Bilateral leg edema (2010), Cellulitis and abscess of left leg (01/2016), CHF (congestive heart failure) (HCC), Essential hypertension, benign, GERD  (gastroesophageal reflux disease), Lymphedema, Morbid obesity (HCC), Post traumatic myelopathy (HCC), Recurrent cellulitis of lower leg, Spinal injury (1993), and Wheelchair dependent.  Surgical History Patient  has a past surgical history that includes Spinal fusion (1993); Back surgery; Joint replacement; and Incision and drainage perirectal abscess (Left, 07/04/2017).  Family History Pateint's family history includes Cancer in his brother, maternal grandmother, and mother; Diabetes in his mother.  Social History Patient  reports that he quit smoking about 11 years ago. His smoking use included cigarettes. He has a 5.00 pack-year smoking history. He has quit using smokeless tobacco. He reports that he does not drink alcohol or use drugs.    Objective: Vitals:   11/28/17 1828  BP: (!) 135/54  Pulse: 69  Resp: 18  Temp: 98.5 F (36.9 C)  TempSrc: Oral  SpO2: 96%    There is no height or weight on file to calculate BMI.  Physical Exam  Constitutional: He appears well-developed and well-nourished.  HENT:  Right Ear: External ear normal.  Left Ear: External ear normal.  Mouth/Throat: No oropharyngeal exudate.  Tm pearly with light reflex bilaterally  Posterior pharynx with erythema/cobblestoning and tonsoliths  Cardiovascular: Normal rate, regular rhythm and normal heart sounds.  Severe bilateral le edema   Pulmonary/Chest: Effort normal. He has wheezes. He has rales.  Abdominal: Soft. Bowel sounds are normal.  Lymphadenopathy:    He has no cervical adenopathy.  Skin: He is diaphoretic.  Vitals reviewed.  Cxr: unable to be done.  Assessment/plan: Cough -unsure if this is respiratory vs. CHF exacerbation. He has rales on exam, weight gain, possible increased swelling, but im worried about his heart with such a poor echo and poor follow up. Sending to ER for further work up labs and imaging. Patient and wife understand and will go across the street.    Systolic  congestive heart failure, unspecified HF chronicity (HCC) -see above.     Orland Mustard, MD  11/28/2017    Orland Mustard, MD 11/28/17 1900

## 2017-11-28 NOTE — Discharge Instructions (Signed)
Please read and follow all provided instructions.  You have been diagnosed today with Pneumonia   Tests performed today include: Blood counts and electrolytes Chest x-ray -- shows pneumonia Vital signs. See below for your results today.   Take medications as prescribed. Please take all of your antibiotics until finished! You may develop abdominal discomfort or diarrhea from the antibiotic.  You may help offset this with probiotics which you can buy or get in yogurt. Do not eat or take the probiotics until 2 hours after your antibiotic. Do not take your medicine if develop an itchy rash, swelling in your mouth or lips, or difficulty breathing. Follow-up with your doctor in the next week in regards to your hospital visit. If you do not have a doctor use the resource guide listed below to help you find one. You may return to the emergency department if symptoms worsen, become progressive, or become more concerning.  What is Pneumonia? - Pneumonia is an infection of the lungs.  CAUSES Pneumonia may be caused by bacteria or a virus. Usually, these infections are caused by breathing infectious particles into the lungs (respiratory tract). SYMPTOMS  Cough.  Fever (>100.4) Chest pain.  Increased rate of breathing.  Wheezing.  Mucus production.  DIAGNOSIS  If you have the common symptoms of pneumonia, your caregiver will typically confirm the diagnosis with a chest X-ray. The X-ray will show an abnormality in the lung (pulmonary infiltrate) if you have pneumonia. Other tests of your blood, urine, or sputum may be done to find the specific cause of your pneumonia. Your caregiver may also do tests (blood gases or pulse oximetry) to see how well your lungs are working. TREATMENT  Some forms of pneumonia may be spread to other people when you cough or sneeze. You may be asked to wear a mask before and during your exam. Pneumonia that is caused by bacteria is treated with antibiotic medicine. Pneumonia  that is caused by the influenza virus may be treated with an antiviral medicine. Take medication as prescribed.  PREVENTION A pneumococcal shot (vaccine) is available to prevent a common bacterial cause of pneumonia. This is usually suggested for: People over 79 years old.  Patients on chemotherapy.  People with chronic lung problems, such as bronchitis or emphysema.  People with immune system problems.  If you are over 65 or have a high-risk condition, you may receive the pneumococcal vaccine if you have not received it before. A routine influenza vaccine is also recommended. This vaccine can help prevent some cases of pneumonia If you smoke, it is time to quit. You may receive instructions and counseling on how to stop smoking.  HOME CARE INSTRUCTIONS  Cough suppressants may be used if you are losing too much rest. However, coughing protects you by clearing your lungs. You should avoid using cough suppressants if you can.  Your caregiver may have prescribed medicine if he or she thinks your pneumonia is caused by a bacteria or influenza. Finish your medicine even if you start to feel better.  Your caregiver may also prescribe an expectorant. This loosens the mucus to be coughed up.  Only take over-the-counter or prescription medicines for pain, discomfort, or fever as directed by your caregiver.  Do not smoke. Smoking is a common cause of bronchitis and can contribute to pneumonia. If you are a smoker and continue to smoke, your cough may last several weeks after your pneumonia has cleared.  A cold steam vaporizer or humidifier in your room or  home may help loosen mucus.  Coughing is often worse at night. Sleeping in a semi-upright position in a recliner or using a couple pillows under your head will help with this.  Get rest as you feel it is needed. Your body will usually let you know when you need to rest.  SEEK IMMEDIATE MEDICAL CARE IF:  Your illness becomes worse. This is especially true  if you are elderly or weakened from any other disease.  You cannot control your cough with suppressants and are losing sleep.  You begin coughing up blood.  You develop pain which is getting worse or is uncontrolled with medicines.  You have a fever (100.37F).  Any of the symptoms which initially brought you in for treatment are getting worse rather than better.  You develop shortness of breath or chest pain.  Additional Information:  Your vital signs today were: BP (!) 114/99    Pulse 87    Temp 99.5 F (37.5 C) (Oral)    Resp 20    Ht 5\' 11"  (1.803 m)    Wt (!) 172.4 kg    SpO2 96%    BMI 53.00 kg/m  If your blood pressure (BP) was elevated above 135/85 this visit, please have this repeated by your doctor within one month. ---------------

## 2017-11-28 NOTE — ED Notes (Signed)
Patient transported to X-ray 
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
## 2017-11-28 NOTE — ED Triage Notes (Signed)
Pt from Urgent Care w/ a c/o SOB and a productive cough since last Friday. Sputum is a "greenish" color. Pt has a CHF. No CP. Complaints of nausea without vomiting. Additional complaints of lower back pain. BLE edema +1.

## 2017-11-28 NOTE — ED Triage Notes (Signed)
Pt URI sx x 4 days with some fever

## 2017-12-09 ENCOUNTER — Emergency Department (HOSPITAL_COMMUNITY)
Admission: EM | Admit: 2017-12-09 | Discharge: 2017-12-09 | Disposition: A | Discharge status: Home / Self Care | Payer: Medicare HMO | Attending: Emergency Medicine | Admitting: Emergency Medicine

## 2017-12-09 ENCOUNTER — Emergency Department (HOSPITAL_COMMUNITY): Payer: Medicare HMO

## 2017-12-09 ENCOUNTER — Encounter (HOSPITAL_COMMUNITY): Payer: Self-pay | Admitting: Emergency Medicine

## 2017-12-09 ENCOUNTER — Other Ambulatory Visit: Payer: Self-pay

## 2017-12-09 DIAGNOSIS — Z7982 Long term (current) use of aspirin: Secondary | ICD-10-CM | POA: Insufficient documentation

## 2017-12-09 DIAGNOSIS — B349 Viral infection, unspecified: Secondary | ICD-10-CM | POA: Diagnosis not present

## 2017-12-09 DIAGNOSIS — R062 Wheezing: Secondary | ICD-10-CM | POA: Diagnosis not present

## 2017-12-09 DIAGNOSIS — F17210 Nicotine dependence, cigarettes, uncomplicated: Secondary | ICD-10-CM | POA: Diagnosis not present

## 2017-12-09 DIAGNOSIS — R05 Cough: Secondary | ICD-10-CM | POA: Insufficient documentation

## 2017-12-09 DIAGNOSIS — R059 Cough, unspecified: Secondary | ICD-10-CM

## 2017-12-09 DIAGNOSIS — J189 Pneumonia, unspecified organism: Secondary | ICD-10-CM | POA: Diagnosis not present

## 2017-12-09 DIAGNOSIS — I10 Essential (primary) hypertension: Secondary | ICD-10-CM | POA: Insufficient documentation

## 2017-12-09 DIAGNOSIS — J9801 Acute bronchospasm: Secondary | ICD-10-CM | POA: Diagnosis not present

## 2017-12-09 DIAGNOSIS — R0602 Shortness of breath: Secondary | ICD-10-CM | POA: Diagnosis not present

## 2017-12-09 MED ORDER — PREDNISONE 50 MG PO TABS: ORAL_TABLET | ORAL | 0 refills | Status: DC

## 2017-12-09 MED ORDER — ALBUTEROL SULFATE (2.5 MG/3ML) 0.083% IN NEBU
5.0000 mg | INHALATION_SOLUTION | Freq: Once | RESPIRATORY_TRACT | Status: AC
  Administered 2017-12-09: 5 mg via RESPIRATORY_TRACT
  Filled 2017-12-09: qty 6

## 2017-12-09 MED ORDER — IPRATROPIUM BROMIDE 0.02 % IN SOLN
0.5000 mg | Freq: Once | RESPIRATORY_TRACT | Status: AC
  Administered 2017-12-09: 0.5 mg via RESPIRATORY_TRACT
  Filled 2017-12-09: qty 2.5

## 2017-12-09 MED ORDER — PREDNISONE 50 MG PO TABS
60.0000 mg | ORAL_TABLET | Freq: Once | ORAL | Status: AC
  Administered 2017-12-09: 60 mg via ORAL
  Filled 2017-12-09: qty 1
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
## 2017-12-09 NOTE — ED Triage Notes (Signed)
Pt recently diagnosed with pneumonia and states he is still sob after finishing his meds.

## 2017-12-10 ENCOUNTER — Ambulatory Visit: Payer: Medicare HMO

## 2017-12-23 ENCOUNTER — Ambulatory Visit: Payer: Medicare HMO | Admitting: Family Medicine

## 2017-12-23 ENCOUNTER — Encounter: Payer: Self-pay | Admitting: Family Medicine

## 2017-12-23 ENCOUNTER — Other Ambulatory Visit: Payer: Self-pay

## 2017-12-23 ENCOUNTER — Ambulatory Visit (INDEPENDENT_AMBULATORY_CARE_PROVIDER_SITE_OTHER): Payer: Medicare HMO | Admitting: Family Medicine

## 2017-12-23 VITALS — BP 110/62 | HR 56 | Temp 98.3°F | Ht 73.0 in

## 2017-12-23 DIAGNOSIS — N3000 Acute cystitis without hematuria: Secondary | ICD-10-CM | POA: Diagnosis not present

## 2017-12-23 DIAGNOSIS — I5022 Chronic systolic (congestive) heart failure: Secondary | ICD-10-CM | POA: Diagnosis not present

## 2017-12-23 DIAGNOSIS — Z23 Encounter for immunization: Secondary | ICD-10-CM

## 2017-12-23 DIAGNOSIS — N3001 Acute cystitis with hematuria: Secondary | ICD-10-CM | POA: Insufficient documentation

## 2017-12-23 DIAGNOSIS — I890 Lymphedema, not elsewhere classified: Secondary | ICD-10-CM

## 2017-12-23 LAB — POCT URINALYSIS DIP (MANUAL ENTRY)
BILIRUBIN UA: NEGATIVE
GLUCOSE UA: NEGATIVE mg/dL
Ketones, POC UA: NEGATIVE mg/dL
NITRITE UA: POSITIVE — AB
Protein Ur, POC: 100 mg/dL — AB
SPEC GRAV UA: 1.02 (ref 1.010–1.025)
Urobilinogen, UA: 2 E.U./dL — AB
pH, UA: 7.5 (ref 5.0–8.0)

## 2017-12-23 LAB — POCT UA - MICROSCOPIC ONLY: WBC, Ur, HPF, POC: >20

## 2017-12-23 MED ORDER — METOPROLOL SUCCINATE ER 25 MG PO TB24: 12.5000 mg | ORAL_TABLET | Freq: Every day | ORAL | 3 refills | Status: DC

## 2017-12-23 MED ORDER — CEPHALEXIN 500 MG PO CAPS: 500.0000 mg | ORAL_CAPSULE | Freq: Four times a day (QID) | ORAL | 0 refills | Status: AC

## 2017-12-23 MED ORDER — FUROSEMIDE 40 MG PO TABS: 40.0000 mg | ORAL_TABLET | Freq: Every day | ORAL | 3 refills | Status: DC | PRN

## 2017-12-23 NOTE — Patient Instructions (Signed)
Referred to cardiology for heart failure follow up.  Refills of toprol and lasix given today.  We are checking some labs today. If results require attention, either myself or my nurse will get in touch with you. If everything is normal, you will get a letter in the mail or a message in My Chart. Please give [place] a call if you do not hear from [place] after 2 weeks.  Make an appointment with our pharmacy clinic here if you would like pulmonary function testing.    Feel free to call with any questions or concerns at any time, at [phone].   Take care,  Dr. [name], DO [facility]

## 2017-12-23 NOTE — Assessment & Plan Note (Signed)
Counseled on healthy diet and exercise.  He is a very minimal smoking history and no history of asthma or wheezing on exam.  Question if he has any obesity hypoventilation syndrome that may have put him at greater risk during his recent pneumonia.  Recommended patient go for PFTs

## 2017-12-23 NOTE — Assessment & Plan Note (Signed)
Although patient does not have dysuria, he is well aware of urinary symptoms that are typical for his when he has a UTI.  UA today is consistent with this.  Treat with Keflex for 7 days.

## 2017-12-23 NOTE — Assessment & Plan Note (Signed)
Stable.  Not in acute exacerbation.  He is on Toprol-XL 12.5 mg daily and Lasix 40 mg daily as needed.  He has had a history of intolerance to ACE/ARB.  Blood pressure is at goal referral to cardiology in [facility] today.

## 2017-12-23 NOTE — Progress Notes (Signed)
Subjective:  Jordan Jensen is a 47 y.o. male who presents to the [facility] today for multiple complaints  HPI:  Urinary complaint Has noticed that his urine has been darker with a stronger odor than usual and he is also having urinary urgency.  No dysuria.  No fevers.  No blood in his urine. Is similar to when he starts having UTI.  He recently was seen in the ED multiple times for breathing complaints.  He was told he had pneumonia.  He was eventually given steroids.  He was told that he needed to ask for nebulizer as this could have presented to the ED visit.  He has never been diagnosed with asthma or COPD.  He does not have any shortness of breath or wheezing today.  He has no cough.  No runny nose or stuffy nose. No orthopnea.  Swelling in his legs are at baseline. Has not been able to be physically active as much as he usually is because of being sick lately and thinks he may have gained some weight because of that. Continues to follow with a cardiologist in [place] and has not been released over 1 year.  He needs refills on his Toprol and Lasix.   ROS: Per HPI  Social Hx: He reports that he quit smoking about 11 years ago. His smoking use included cigarettes. He has a 5.00 pack-year smoking history. He has quit using smokeless tobacco. He reports that he does not drink alcohol or use drugs.   Objective:  Physical Exam: BP 110/62   Pulse (!) 56   Temp 98.3 F (36.8 C) (Oral)   Ht 6\' 1"  (1.854 m)   SpO2 95%   BMI 50.12 kg/m   Gen: NAD, obese, resting comfortably in wheelchair CV: RRR with no murmurs appreciated Pulm: NWOB, CTAB with no crackles, wheezes, or rhonchi GI: Normal bowel sounds present. Soft, Nontender, Nondistended. MSK: compression stockings in place bilaterally Skin: warm, dry Psych: Normal affect and thought content  Results for orders placed or performed in visit on 12/23/17 (from the past 72 hour(s))  POCT urinalysis dipstick     Status: Abnormal   Collection Time: 12/23/17  3:45 PM  Result Value Ref Range   Color, UA yellow yellow   Clarity, UA cloudy (A) clear   Glucose, UA negative negative mg/dL   Bilirubin, UA negative negative   Ketones, POC UA negative negative mg/dL   Spec Grav, UA 4.098 1.191 - 1.025   Blood, UA trace-intact (A) negative   pH, UA 7.5 5.0 - 8.0   Protein Ur, POC =100 (A) negative mg/dL   Urobilinogen, UA 2.0 (A) 0.2 or 1.0 E.U./dL   Nitrite, UA Positive (A) Negative   Leukocytes, UA Moderate (2+) (A) Negative     Assessment/Plan:  CHF NYHA class III (HCC) Stable.  Not in acute exacerbation.  He is on Toprol-XL 12.5 mg daily and Lasix 40 mg daily as needed.  He has had a history of intolerance to ACE/ARB.  Blood pressure is at goal referral to cardiology in [place] [facility] today.  Morbid obesity (HCC) Counseled on healthy diet and exercise.  He is a very minimal smoking history and no history of asthma or wheezing on exam.  Question if he has any obesity hypoventilation syndrome that may have put him at greater risk during his recent pneumonia.  Recommended patient go for PFTs  Acute cystitis without hematuria Although patient does not have dysuria, he is well aware of urinary  symptoms that are typical for his when he has a UTI.  UA today is consistent with this.  Treat with Keflex for 7 days.   [name], DO PGY-3, [facility] 12/23/2017 3:14 PM

## 2017-12-24 LAB — CBC
HEMATOCRIT: 42.9 % (ref 37.5–51.0)
HEMOGLOBIN: 15 g/dL (ref 13.0–17.7)
MCH: 30.2 pg (ref 26.6–33.0)
MCHC: 35 g/dL (ref 31.5–35.7)
MCV: 86 fL (ref 79–97)
Platelets: 175 10*3/uL (ref 150–450)
RBC: 4.97 x10E6/uL (ref 4.14–5.80)
RDW: 13 % (ref 12.3–15.4)
WBC: 12.1 10*3/uL — ABNORMAL HIGH (ref 3.4–10.8)

## 2017-12-24 LAB — LIPID PANEL
CHOL/HDL RATIO: 5.1 ratio — AB (ref 0.0–5.0)
Cholesterol, Total: 193 mg/dL (ref 100–199)
HDL: 38 mg/dL — AB (ref >39)
LDL CALC: 132 mg/dL — AB (ref 0–99)
Triglycerides: 116 mg/dL (ref 0–149)
VLDL CHOLESTEROL CAL: 23 mg/dL (ref 5–40)

## 2018-01-06 ENCOUNTER — Encounter (HOSPITAL_COMMUNITY): Payer: Self-pay | Admitting: Emergency Medicine

## 2018-01-06 ENCOUNTER — Other Ambulatory Visit: Payer: Self-pay

## 2018-01-06 ENCOUNTER — Ambulatory Visit (HOSPITAL_COMMUNITY)
Admission: EM | Admit: 2018-01-06 | Discharge: 2018-01-06 | Disposition: A | Discharge status: Home / Self Care | Payer: Medicare HMO | Attending: Family Medicine | Admitting: Family Medicine

## 2018-01-06 DIAGNOSIS — Z87891 Personal history of nicotine dependence: Secondary | ICD-10-CM | POA: Insufficient documentation

## 2018-01-06 DIAGNOSIS — R062 Wheezing: Secondary | ICD-10-CM | POA: Diagnosis not present

## 2018-01-06 DIAGNOSIS — R05 Cough: Secondary | ICD-10-CM | POA: Diagnosis not present

## 2018-01-06 DIAGNOSIS — N390 Urinary tract infection, site not specified: Secondary | ICD-10-CM | POA: Insufficient documentation

## 2018-01-06 DIAGNOSIS — R0981 Nasal congestion: Secondary | ICD-10-CM | POA: Insufficient documentation

## 2018-01-06 DIAGNOSIS — R82998 Other abnormal findings in urine: Secondary | ICD-10-CM | POA: Diagnosis not present

## 2018-01-06 LAB — POCT URINALYSIS DIP (DEVICE)
Bilirubin Urine: NEGATIVE
Glucose, UA: NEGATIVE mg/dL
KETONES UR: NEGATIVE mg/dL
Leukocytes, UA: NEGATIVE
Nitrite: NEGATIVE
PH: 5.5 (ref 5.0–8.0)
PROTEIN: NEGATIVE mg/dL
Specific Gravity, Urine: >=1.03 (ref 1.005–1.030)
Urobilinogen, UA: 0.2 mg/dL (ref 0.0–1.0)

## 2018-01-06 MED ORDER — PREDNISONE 10 MG (21) PO TBPK: ORAL_TABLET | Freq: Every day | ORAL | 0 refills | Status: DC

## 2018-01-06 NOTE — ED Triage Notes (Addendum)
Patient is concerned for uti.  Diagnosed last week with auti and prescribed cephalexin.  Doesn't think it is helping  Patient is also concerned for cough and phlegm, sinus drainage

## 2018-01-08 LAB — URINE CULTURE: CULTURE: NO GROWTH
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
## 2018-01-16 ENCOUNTER — Emergency Department (HOSPITAL_COMMUNITY)
Admission: EM | Admit: 2018-01-16 | Discharge: 2018-01-16 | Disposition: A | Discharge status: Home / Self Care | Payer: Medicare HMO | Attending: Emergency Medicine | Admitting: Emergency Medicine

## 2018-01-16 ENCOUNTER — Other Ambulatory Visit: Payer: Self-pay

## 2018-01-16 ENCOUNTER — Encounter (HOSPITAL_COMMUNITY): Payer: Self-pay | Admitting: Emergency Medicine

## 2018-01-16 ENCOUNTER — Emergency Department (HOSPITAL_COMMUNITY): Payer: Medicare HMO

## 2018-01-16 DIAGNOSIS — K4090 Unilateral inguinal hernia, without obstruction or gangrene, not specified as recurrent: Secondary | ICD-10-CM | POA: Diagnosis not present

## 2018-01-16 DIAGNOSIS — I509 Heart failure, unspecified: Secondary | ICD-10-CM | POA: Insufficient documentation

## 2018-01-16 DIAGNOSIS — I110 Hypertensive heart disease with heart failure: Secondary | ICD-10-CM | POA: Insufficient documentation

## 2018-01-16 DIAGNOSIS — Z96649 Presence of unspecified artificial hip joint: Secondary | ICD-10-CM | POA: Insufficient documentation

## 2018-01-16 DIAGNOSIS — M545 Low back pain: Secondary | ICD-10-CM | POA: Diagnosis not present

## 2018-01-16 DIAGNOSIS — Z87891 Personal history of nicotine dependence: Secondary | ICD-10-CM | POA: Diagnosis not present

## 2018-01-16 DIAGNOSIS — N390 Urinary tract infection, site not specified: Secondary | ICD-10-CM | POA: Diagnosis not present

## 2018-01-16 DIAGNOSIS — R300 Dysuria: Secondary | ICD-10-CM | POA: Diagnosis present

## 2018-01-16 DIAGNOSIS — R1031 Right lower quadrant pain: Secondary | ICD-10-CM | POA: Diagnosis not present

## 2018-01-16 LAB — COMPREHENSIVE METABOLIC PANEL
ALT: 31 U/L (ref 0–44)
AST: 27 U/L (ref 15–41)
Albumin: 4 g/dL (ref 3.5–5.0)
Alkaline Phosphatase: 52 U/L (ref 38–126)
Anion gap: 8 (ref 5–15)
BUN: 16 mg/dL (ref 6–20)
CHLORIDE: 102 mmol/L (ref 98–111)
CO2: 28 mmol/L (ref 22–32)
CREATININE: 0.93 mg/dL (ref 0.61–1.24)
Calcium: 9.3 mg/dL (ref 8.9–10.3)
GFR calc Af Amer: >60 mL/min (ref >60)
GFR calc non Af Amer: >60 mL/min (ref >60)
Glucose, Bld: 94 mg/dL (ref 70–99)
Potassium: 4.2 mmol/L (ref 3.5–5.1)
SODIUM: 138 mmol/L (ref 135–145)
Total Bilirubin: 1.1 mg/dL (ref 0.3–1.2)
Total Protein: 8.2 g/dL — ABNORMAL HIGH (ref 6.5–8.1)

## 2018-01-16 LAB — URINALYSIS, ROUTINE W REFLEX MICROSCOPIC
Bilirubin Urine: NEGATIVE
Glucose, UA: NEGATIVE mg/dL
KETONES UR: NEGATIVE mg/dL
Nitrite: NEGATIVE
PROTEIN: NEGATIVE mg/dL
Specific Gravity, Urine: 1.02 (ref 1.005–1.030)
WBC, UA: >50 WBC/hpf — ABNORMAL HIGH (ref 0–5)
pH: 6 (ref 5.0–8.0)

## 2018-01-16 LAB — CBC
HCT: 47 % (ref 39.0–52.0)
Hemoglobin: 14.7 g/dL (ref 13.0–17.0)
MCH: 29.5 pg (ref 26.0–34.0)
MCHC: 31.3 g/dL (ref 30.0–36.0)
MCV: 94.2 fL (ref 80.0–100.0)
Platelets: 183 10*3/uL (ref 150–400)
RBC: 4.99 MIL/uL (ref 4.22–5.81)
RDW: 13.2 % (ref 11.5–15.5)
WBC: 14.2 10*3/uL — ABNORMAL HIGH (ref 4.0–10.5)
nRBC: 0 % (ref 0.0–0.2)

## 2018-01-16 LAB — LIPASE, BLOOD: Lipase: 24 U/L (ref 11–51)

## 2018-01-16 MED ORDER — CIPROFLOXACIN HCL 500 MG PO TABS: 500.0000 mg | ORAL_TABLET | Freq: Two times a day (BID) | ORAL | 0 refills | Status: AC

## 2018-01-16 MED ORDER — OXYCODONE HCL 5 MG PO TABS
5.0000 mg | ORAL_TABLET | Freq: Once | ORAL | Status: AC
  Administered 2018-01-16: 5 mg via ORAL
  Filled 2018-01-16: qty 1

## 2018-01-16 MED ORDER — CIPROFLOXACIN HCL 250 MG PO TABS
500.0000 mg | ORAL_TABLET | Freq: Once | ORAL | Status: AC
  Administered 2018-01-16: 500 mg via ORAL
  Filled 2018-01-16: qty 2

## 2018-01-16 NOTE — Discharge Instructions (Addendum)
You were evaluated in the Emergency Department and after careful evaluation, we did not find any emergent condition requiring admission or further testing in the hospital.  Your symptoms today seem to be due to a recurrent urinary tract infection.  Take the antibiotic as directed.  If this antibiotic does not work, it would be best to follow-up with a urologist, such as the one provided.  Please return to the Emergency Department if you experience any worsening of your condition.  We encourage you to follow up with a primary care provider.  Thank you for allowing [place] to be a part of your care.

## 2018-01-16 NOTE — ED Notes (Signed)
From CT 

## 2018-01-16 NOTE — ED Triage Notes (Signed)
Pt c/o lower right back pain, right groin pain, increased urination x 2 days, pt reports he was treated for UTI 2 weeks ago, completed antibiotic Sunday

## 2018-01-16 NOTE — ED Notes (Signed)
Due to pt girth and room size unable to scan meds

## 2018-01-16 NOTE — ED Notes (Signed)
To CT
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
## 2018-01-16 NOTE — ED Notes (Signed)
Dr B in to assess 

## 2018-01-19 LAB — URINE CULTURE: Culture: 40000 — AB

## 2018-01-20 ENCOUNTER — Telehealth: Payer: Self-pay | Admitting: *Deleted

## 2018-01-20 NOTE — Telephone Encounter (Signed)
Post ED Visit - Positive Culture Follow-up  Culture report reviewed by antimicrobial stewardship pharmacist:  []  Enzo Bi, Pharm.D. []  Celedonio Miyamoto, Pharm.D., BCPS AQ-ID []  Garvin Fila, Pharm.D., BCPS []  Georgina Pillion, Pharm.D., BCPS []  [place], [address].D., BCPS, AAHIVP []  Estella Husk, Pharm.D., BCPS, AAHIVP []  Lysle Pearl, PharmD, BCPS []  Phillips Climes, PharmD, BCPS []  Agapito Games, PharmD, BCPS []  Verlan Friends, PharmD  Positive urine culture, reviewed by Ewing Schlein, PharmD Treated with Ciprofloxacin HCL, organism sensitive to the same and no further patient follow-up is required at this time.  Virl Axe [facility] 01/20/2018, 9:18 AM

## 2018-01-29 ENCOUNTER — Ambulatory Visit (INDEPENDENT_AMBULATORY_CARE_PROVIDER_SITE_OTHER): Payer: Medicare HMO | Admitting: Cardiovascular Disease

## 2018-01-29 ENCOUNTER — Encounter: Payer: Self-pay | Admitting: Cardiovascular Disease

## 2018-01-29 VITALS — BP 116/72 | HR 52 | Ht 71.0 in

## 2018-01-29 DIAGNOSIS — I428 Other cardiomyopathies: Secondary | ICD-10-CM

## 2018-01-29 DIAGNOSIS — I5022 Chronic systolic (congestive) heart failure: Secondary | ICD-10-CM | POA: Diagnosis not present

## 2018-01-29 DIAGNOSIS — I493 Ventricular premature depolarization: Secondary | ICD-10-CM | POA: Diagnosis not present
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
## 2018-01-29 NOTE — Patient Instructions (Signed)
Medication Instructions:  Your physician recommends that you continue on your current medications as directed. Please refer to the Current Medication list given to you today.  If you need a refill on your cardiac medications before your next appointment, please call your pharmacy.   Lab work: None today If you have labs (blood work) drawn today and your tests are completely normal, you will receive your results only by: Marland Kitchen MyChart Message (if you have MyChart) OR . A paper copy in the mail If you have any lab test that is abnormal or we need to change your treatment, we will call you to review the results.  Testing/Procedures: Your physician has requested that you have an echocardiogram. Echocardiography is a painless test that uses sound waves to create images of your heart. It provides your doctor with information about the size and shape of your heart and how well your heart's chambers and valves are working. This procedure takes approximately one hour. There are no restrictions for this procedure.    Follow-Up: At [facility], you and your health needs are our priority.  As part of our continuing mission to provide you with exceptional heart care, we have created designated Provider Care Teams.  These Care Teams include your primary Cardiologist (physician) and Advanced Practice Providers (APPs -  Physician Assistants and Nurse Practitioners) who all work together to provide you with the care you need, when you need it. You will need a follow up appointment in 6 months.  Please call our office 2 months in advance to schedule this appointment.  You may see Prentice Docker, MD or one of the following Advanced Practice Providers on your designated Care Team:   Randall An, PA-C [facility]) . Jacolyn Reedy, PA-C [facility] Office)  Any Other Special Instructions Will Be Listed Below (If Applicable). None

## 2018-02-05 ENCOUNTER — Other Ambulatory Visit (HOSPITAL_COMMUNITY): Payer: Medicare HMO

## 2018-02-08 ENCOUNTER — Telehealth: Payer: Self-pay

## 2018-02-08 ENCOUNTER — Ambulatory Visit (HOSPITAL_COMMUNITY)
Admission: RE | Admit: 2018-02-08 | Discharge: 2018-02-08 | Disposition: A | Discharge status: Home / Self Care | Payer: Medicare HMO | Source: Ambulatory Visit | Attending: Cardiovascular Disease | Admitting: Cardiovascular Disease

## 2018-02-08 DIAGNOSIS — I5022 Chronic systolic (congestive) heart failure: Secondary | ICD-10-CM | POA: Diagnosis not present

## 2018-02-08 MED ORDER — SACUBITRIL-VALSARTAN 24-26 MG PO TABS: 1.0000 | ORAL_TABLET | Freq: Two times a day (BID) | ORAL | 6 refills | Status: DC

## 2018-02-08 MED ORDER — PERFLUTREN LIPID MICROSPHERE
1.0000 mL | INTRAVENOUS | Status: AC | PRN
  Administered 2018-02-08: 1 mL via INTRAVENOUS
  Administered 2018-02-08: 2 mL via INTRAVENOUS
  Filled 2018-02-08: qty 10

## 2018-02-08 NOTE — Telephone Encounter (Signed)
-----   Message from Laqueta Linden, MD sent at 02/08/2018 11:26 AM EST ----- Cardiac function is moderate to severely reduced.  Continue to take Toprol-XL 12.5 mg daily without missing doses.  Add Entresto 24-26 mg twice daily.  Please asked him to call us back in 2 to 3 weeks to let us know how he feels with the addition of this new medication.

## 2018-02-08 NOTE — Progress Notes (Signed)
*  PRELIMINARY RESULTS* Echocardiogram 2D Echocardiogram has been performed with Definity.  Stacey Drain 02/08/2018, 10:01 AM

## 2018-02-08 NOTE — Telephone Encounter (Signed)
Pt notified of echo results.States he has not taken Toprol daily but will now.I explained that we will start him on Entresto 24/26 mg BID and I e-scribed free 30 day trial to [employer].I have also asked him to call us back with update in 2-3 weeks.pat agrees

## 2018-02-28 ENCOUNTER — Encounter (HOSPITAL_COMMUNITY): Payer: Self-pay | Admitting: Emergency Medicine

## 2018-02-28 ENCOUNTER — Emergency Department (HOSPITAL_COMMUNITY)
Admission: EM | Admit: 2018-02-28 | Discharge: 2018-03-01 | Disposition: A | Discharge status: Home / Self Care | Payer: Medicare HMO | Attending: Emergency Medicine | Admitting: Emergency Medicine

## 2018-02-28 ENCOUNTER — Other Ambulatory Visit: Payer: Self-pay

## 2018-02-28 DIAGNOSIS — Z91040 Latex allergy status: Secondary | ICD-10-CM | POA: Insufficient documentation

## 2018-02-28 DIAGNOSIS — J069 Acute upper respiratory infection, unspecified: Secondary | ICD-10-CM | POA: Diagnosis not present

## 2018-02-28 DIAGNOSIS — Z79899 Other long term (current) drug therapy: Secondary | ICD-10-CM | POA: Insufficient documentation

## 2018-02-28 DIAGNOSIS — I509 Heart failure, unspecified: Secondary | ICD-10-CM | POA: Diagnosis not present

## 2018-02-28 DIAGNOSIS — Z87891 Personal history of nicotine dependence: Secondary | ICD-10-CM | POA: Insufficient documentation

## 2018-02-28 DIAGNOSIS — Z7982 Long term (current) use of aspirin: Secondary | ICD-10-CM | POA: Diagnosis not present

## 2018-02-28 DIAGNOSIS — M545 Low back pain: Secondary | ICD-10-CM | POA: Diagnosis present

## 2018-02-28 DIAGNOSIS — N3000 Acute cystitis without hematuria: Secondary | ICD-10-CM | POA: Diagnosis not present

## 2018-02-28 DIAGNOSIS — B9789 Other viral agents as the cause of diseases classified elsewhere: Secondary | ICD-10-CM | POA: Diagnosis not present

## 2018-02-28 LAB — URINALYSIS, ROUTINE W REFLEX MICROSCOPIC
BILIRUBIN URINE: NEGATIVE
Glucose, UA: NEGATIVE mg/dL
Ketones, ur: NEGATIVE mg/dL
Leukocytes,Ua: NEGATIVE
Nitrite: NEGATIVE
Protein, ur: NEGATIVE mg/dL
Specific Gravity, Urine: 1.035 — ABNORMAL HIGH (ref 1.005–1.030)
pH: 5 (ref 5.0–8.0)

## 2018-02-28 NOTE — ED Triage Notes (Signed)
Pt c/o back pain and dark urine with a strong odor x 5 days, denies urinary frequency, pt also c/o nasal drainage with cough, reports some ear and throat discomfort x 1 week

## 2018-03-01 DIAGNOSIS — N3000 Acute cystitis without hematuria: Secondary | ICD-10-CM | POA: Diagnosis not present

## 2018-03-01 MED ORDER — LEVOFLOXACIN 500 MG PO TABS
500.0000 mg | ORAL_TABLET | Freq: Once | ORAL | Status: AC
  Administered 2018-03-01: 500 mg via ORAL
  Filled 2018-03-01: qty 1

## 2018-03-01 MED ORDER — LEVOFLOXACIN 500 MG PO TABS: 500.0000 mg | ORAL_TABLET | Freq: Every day | ORAL | 0 refills | Status: DC

## 2018-03-01 NOTE — Discharge Instructions (Signed)
Drink plenty of fluids.  Take the antibiotics until gone.  Recheck if you get a fever, vomiting, or abdominal pain.  Please call [facility] to follow-up on the consult your doctor wanted.
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
## 2018-03-02 LAB — URINE CULTURE
Culture: NO GROWTH
SPECIAL REQUESTS: NORMAL

## 2018-03-26 ENCOUNTER — Telehealth: Payer: Self-pay | Admitting: *Deleted

## 2018-03-26 NOTE — Telephone Encounter (Signed)
Earache x 5 days.  He contributes it to getting water into his ear.  He denies fever and sore throat. Advised I would send message to MD per Covid-19 precautions. Jone Baseman, CMA

## 2018-03-27 MED ORDER — CIPROFLOXACIN HCL 0.2 % OT SOLN: 0.2000 mL | Freq: Two times a day (BID) | OTIC | 0 refills | Status: AC

## 2018-03-27 NOTE — Telephone Encounter (Signed)
Called patient. He states it feels like swimmer's ear he has in the past. No fever. Slight drainage. Since no red flag symptoms advised treatment with ciprofloxacin otic solution. Discussed needs to be seen and evaluated if no improvement or develops fever. Patient voiced good understanding.

## 2018-04-13 ENCOUNTER — Telehealth: Payer: Self-pay | Admitting: Family Medicine

## 2018-04-13 DIAGNOSIS — I890 Lymphedema, not elsewhere classified: Secondary | ICD-10-CM

## 2018-04-13 DIAGNOSIS — G9589 Other specified diseases of spinal cord: Secondary | ICD-10-CM

## 2018-04-13 NOTE — Telephone Encounter (Signed)
Pt needs a prescription to get some new crutches. Pt says he uses crutches since he is disabled and one of his has broke. Please call pt back when this has been done.

## 2018-04-13 NOTE — Telephone Encounter (Signed)
I called patient. Pt stated he usually gets a prescription sent electronically for his crutches to [facility] on [address]. I am not sure if you can actually do this. I informed him we would try. If not, we can mail him the paper prescription. Please advise.

## 2018-04-14 NOTE — Telephone Encounter (Signed)
Rx printed and placed in fax pile.

## 2018-04-19 NOTE — Telephone Encounter (Signed)
[employer] does not Print production planner.  He would like the order sent to [facility] in [place].   Contacted Melissa with [facility].  The [place] office has been closed temporarily.  She suggest that in order to get the crutches quicker that he pick up an order and take to the store on [address] in [place].    Pt is agreeable to plan and will pick up order tomorrow.  Jone Baseman, CMA

## 2018-05-04 ENCOUNTER — Other Ambulatory Visit: Payer: Self-pay

## 2018-05-04 ENCOUNTER — Telehealth (INDEPENDENT_AMBULATORY_CARE_PROVIDER_SITE_OTHER): Payer: Medicare HMO | Admitting: Family Medicine

## 2018-05-04 DIAGNOSIS — N3000 Acute cystitis without hematuria: Secondary | ICD-10-CM | POA: Diagnosis not present

## 2018-05-04 MED ORDER — LEVOFLOXACIN 500 MG PO TABS: 500.0000 mg | ORAL_TABLET | Freq: Every day | ORAL | 0 refills | Status: DC

## 2018-05-04 NOTE — Assessment & Plan Note (Signed)
4 days of odor, 2 days of dysuria.  Mild Back pain.  No fever.  These are consistent with previous UTI symptoms.  Gave pt 7d 500mg  levofloxacin as he stated this worked well for his last UTI.

## 2018-05-04 NOTE — Progress Notes (Signed)
[facility] Telemedicine Visit  Patient consented to have virtual visit. Method of visit: Video was attempted, but technology challenges prevented patient from using video, so visit was conducted via telephone.  Encounter participants: Patient: Jordan Jensen - located at home Provider: Sandre Kitty - located at [facility] Others (if applicable): none  Chief Complaint: dysuria  HPI:  3-4 months ago was his last UTI.  Urine has an odor to it since friday.  Slight burning when he urinates since Sunday.. slight Back pain.  Unable to keep urologist appointment due to covid restrictions. No fever.  Has been drinking a lot of cranberry juice and water. He does not have a catheter. Is able to void on his own. States the medicine they gave him in [place] ED really worked well.    ROS: per HPI  Pertinent PMHx: quadriplegia. UTI  Exam:  Respiratory: able to speak in full sentences without difficulty. No cough.   Assessment/Plan:  Acute cystitis without hematuria 4 days of odor, 2 days of dysuria.  Mild Back pain.  No fever.  These are consistent with previous UTI symptoms.  Gave pt 7d 500mg  levofloxacin as he stated this worked well for his last UTI.     Time spent during visit with patient: 12 minutes

## 2018-07-02 ENCOUNTER — Ambulatory Visit: Payer: Medicare HMO | Admitting: Family Medicine

## 2018-07-02 ENCOUNTER — Telehealth: Payer: Self-pay | Admitting: Family Medicine

## 2018-07-02 NOTE — Telephone Encounter (Signed)
Has abscess on his back. Has no fever. Wanted some instructions regarding home care. Discussed keeping clean with warm water and soap as well as using warm compresses. Discussed could make appointment to have it assessed in person to see if needs I&D.

## 2018-07-02 NOTE — Telephone Encounter (Signed)
Pt called to cancel his apt today but asked if Dr. Shawna Orleans could give him a call today. Please give pt a call back.

## 2018-07-07 ENCOUNTER — Inpatient Hospital Stay (HOSPITAL_COMMUNITY)
Admission: EM | Admit: 2018-07-07 | Discharge: 2018-07-10 | DRG: 872 | Disposition: A | Discharge status: Home / Self Care | Payer: Medicare HMO | Attending: Internal Medicine | Admitting: Internal Medicine

## 2018-07-07 ENCOUNTER — Emergency Department (HOSPITAL_COMMUNITY): Payer: Medicare HMO

## 2018-07-07 ENCOUNTER — Ambulatory Visit
Admission: EM | Admit: 2018-07-07 | Discharge: 2018-07-07 | Disposition: A | Discharge status: Home / Self Care | Payer: Medicare HMO | Source: Home / Self Care

## 2018-07-07 ENCOUNTER — Encounter (HOSPITAL_COMMUNITY): Payer: Self-pay | Admitting: Emergency Medicine

## 2018-07-07 ENCOUNTER — Other Ambulatory Visit: Payer: Self-pay

## 2018-07-07 DIAGNOSIS — Z87891 Personal history of nicotine dependence: Secondary | ICD-10-CM

## 2018-07-07 DIAGNOSIS — E785 Hyperlipidemia, unspecified: Secondary | ICD-10-CM | POA: Diagnosis present

## 2018-07-07 DIAGNOSIS — Z6841 Body Mass Index (BMI) 40.0 and over, adult: Secondary | ICD-10-CM | POA: Diagnosis not present

## 2018-07-07 DIAGNOSIS — A419 Sepsis, unspecified organism: Secondary | ICD-10-CM | POA: Diagnosis present

## 2018-07-07 DIAGNOSIS — I10 Essential (primary) hypertension: Secondary | ICD-10-CM | POA: Diagnosis present

## 2018-07-07 DIAGNOSIS — Z79899 Other long term (current) drug therapy: Secondary | ICD-10-CM | POA: Diagnosis not present

## 2018-07-07 DIAGNOSIS — L02212 Cutaneous abscess of back [any part, except buttock]: Secondary | ICD-10-CM | POA: Diagnosis present

## 2018-07-07 DIAGNOSIS — L0291 Cutaneous abscess, unspecified: Secondary | ICD-10-CM | POA: Diagnosis not present

## 2018-07-07 DIAGNOSIS — K219 Gastro-esophageal reflux disease without esophagitis: Secondary | ICD-10-CM | POA: Diagnosis present

## 2018-07-07 DIAGNOSIS — Z1159 Encounter for screening for other viral diseases: Secondary | ICD-10-CM | POA: Diagnosis not present

## 2018-07-07 DIAGNOSIS — R739 Hyperglycemia, unspecified: Secondary | ICD-10-CM | POA: Diagnosis present

## 2018-07-07 DIAGNOSIS — Z4789 Encounter for other orthopedic aftercare: Secondary | ICD-10-CM | POA: Diagnosis not present

## 2018-07-07 DIAGNOSIS — I5022 Chronic systolic (congestive) heart failure: Secondary | ICD-10-CM | POA: Diagnosis not present

## 2018-07-07 DIAGNOSIS — I110 Hypertensive heart disease with heart failure: Secondary | ICD-10-CM | POA: Diagnosis present

## 2018-07-07 DIAGNOSIS — I959 Hypotension, unspecified: Secondary | ICD-10-CM | POA: Diagnosis not present

## 2018-07-07 DIAGNOSIS — L03312 Cellulitis of back [any part except buttock]: Secondary | ICD-10-CM | POA: Diagnosis present

## 2018-07-07 DIAGNOSIS — R509 Fever, unspecified: Secondary | ICD-10-CM | POA: Diagnosis not present

## 2018-07-07 DIAGNOSIS — Z03818 Encounter for observation for suspected exposure to other biological agents ruled out: Secondary | ICD-10-CM | POA: Diagnosis not present

## 2018-07-07 DIAGNOSIS — R918 Other nonspecific abnormal finding of lung field: Secondary | ICD-10-CM | POA: Diagnosis not present

## 2018-07-07 DIAGNOSIS — R52 Pain, unspecified: Secondary | ICD-10-CM | POA: Diagnosis not present

## 2018-07-07 LAB — COMPREHENSIVE METABOLIC PANEL
ALT: 21 U/L (ref 0–44)
AST: 22 U/L (ref 15–41)
Albumin: 3.9 g/dL (ref 3.5–5.0)
Alkaline Phosphatase: 52 U/L (ref 38–126)
Anion gap: 14 (ref 5–15)
BUN: 11 mg/dL (ref 6–20)
CO2: 24 mmol/L (ref 22–32)
Calcium: 9.2 mg/dL (ref 8.9–10.3)
Chloride: 96 mmol/L — ABNORMAL LOW (ref 98–111)
Creatinine, Ser: 1.11 mg/dL (ref 0.61–1.24)
GFR calc Af Amer: >60 mL/min (ref >60)
GFR calc non Af Amer: >60 mL/min (ref >60)
Glucose, Bld: 117 mg/dL — ABNORMAL HIGH (ref 70–99)
Potassium: 3.7 mmol/L (ref 3.5–5.1)
Sodium: 134 mmol/L — ABNORMAL LOW (ref 135–145)
Total Bilirubin: 1.4 mg/dL — ABNORMAL HIGH (ref 0.3–1.2)
Total Protein: 8.2 g/dL — ABNORMAL HIGH (ref 6.5–8.1)

## 2018-07-07 LAB — CBC WITH DIFFERENTIAL/PLATELET
Abs Immature Granulocytes: 0.11 10*3/uL — ABNORMAL HIGH (ref 0.00–0.07)
Basophils Absolute: 0.1 10*3/uL (ref 0.0–0.1)
Basophils Relative: 0 %
Eosinophils Absolute: 0 10*3/uL (ref 0.0–0.5)
Eosinophils Relative: 0 %
HCT: 43.8 % (ref 39.0–52.0)
Hemoglobin: 14.2 g/dL (ref 13.0–17.0)
Immature Granulocytes: 1 %
Lymphocytes Relative: 7 %
Lymphs Abs: 1.6 10*3/uL (ref 0.7–4.0)
MCH: 30.1 pg (ref 26.0–34.0)
MCHC: 32.4 g/dL (ref 30.0–36.0)
MCV: 92.8 fL (ref 80.0–100.0)
Monocytes Absolute: 1.5 10*3/uL — ABNORMAL HIGH (ref 0.1–1.0)
Monocytes Relative: 7 %
Neutro Abs: 19.1 10*3/uL — ABNORMAL HIGH (ref 1.7–7.7)
Neutrophils Relative %: 85 %
Platelets: 180 10*3/uL (ref 150–400)
RBC: 4.72 MIL/uL (ref 4.22–5.81)
RDW: 12.7 % (ref 11.5–15.5)
WBC: 22.5 10*3/uL — ABNORMAL HIGH (ref 4.0–10.5)
nRBC: 0 % (ref 0.0–0.2)

## 2018-07-07 LAB — URINALYSIS, ROUTINE W REFLEX MICROSCOPIC
Bilirubin Urine: NEGATIVE
Glucose, UA: NEGATIVE mg/dL
Hgb urine dipstick: NEGATIVE
Ketones, ur: NEGATIVE mg/dL
Leukocytes,Ua: NEGATIVE
Nitrite: NEGATIVE
Protein, ur: 100 mg/dL — AB
Specific Gravity, Urine: 1.035 — ABNORMAL HIGH (ref 1.005–1.030)
pH: 5 (ref 5.0–8.0)

## 2018-07-07 LAB — LACTIC ACID, PLASMA
Lactic Acid, Venous: 1.2 mmol/L (ref 0.5–1.9)
Lactic Acid, Venous: 0.9 mmol/L (ref 0.5–1.9)

## 2018-07-07 LAB — SARS CORONAVIRUS 2 BY RT PCR (HOSPITAL ORDER, PERFORMED IN ~~LOC~~ HOSPITAL LAB): SARS Coronavirus 2: NEGATIVE

## 2018-07-07 MED ORDER — SODIUM CHLORIDE 0.9 % IV SOLN
2.0000 g | Freq: Once | INTRAVENOUS | Status: AC
  Administered 2018-07-07: 2 g via INTRAVENOUS
  Filled 2018-07-07: qty 2

## 2018-07-07 MED ORDER — ENOXAPARIN SODIUM 100 MG/ML ~~LOC~~ SOLN
100.0000 mg | SUBCUTANEOUS | Status: DC
  Administered 2018-07-07 – 2018-07-09 (×3): 100 mg via SUBCUTANEOUS
  Filled 2018-07-07 (×3): qty 1

## 2018-07-07 MED ORDER — SODIUM CHLORIDE 0.9 % IV BOLUS (SEPSIS)
1000.0000 mL | Freq: Once | INTRAVENOUS | Status: AC
  Administered 2018-07-07: 1000 mL via INTRAVENOUS

## 2018-07-07 MED ORDER — METOPROLOL SUCCINATE ER 25 MG PO TB24
12.5000 mg | ORAL_TABLET | Freq: Every day | ORAL | Status: DC
  Filled 2018-07-07: qty 1

## 2018-07-07 MED ORDER — SODIUM CHLORIDE 0.9 % IV BOLUS (SEPSIS): 250.0000 mL | Freq: Once | INTRAVENOUS | Status: DC

## 2018-07-07 MED ORDER — ACETAMINOPHEN 650 MG RE SUPP: 650.0000 mg | Freq: Four times a day (QID) | RECTAL | Status: DC | PRN

## 2018-07-07 MED ORDER — METRONIDAZOLE IN NACL 5-0.79 MG/ML-% IV SOLN
500.0000 mg | Freq: Once | INTRAVENOUS | Status: AC
  Administered 2018-07-07: 500 mg via INTRAVENOUS
  Filled 2018-07-07: qty 100

## 2018-07-07 MED ORDER — SODIUM CHLORIDE 0.9 % IV SOLN: 250.0000 mL | INTRAVENOUS | Status: DC | PRN

## 2018-07-07 MED ORDER — LIDOCAINE HCL (PF) 1 % IJ SOLN
5.0000 mL | Freq: Once | INTRAMUSCULAR | Status: DC
  Filled 2018-07-07: qty 6

## 2018-07-07 MED ORDER — SODIUM CHLORIDE 0.9% FLUSH
3.0000 mL | Freq: Two times a day (BID) | INTRAVENOUS | Status: DC
  Administered 2018-07-08 – 2018-07-09 (×2): 3 mL via INTRAVENOUS

## 2018-07-07 MED ORDER — SODIUM CHLORIDE 0.9% FLUSH: 3.0000 mL | INTRAVENOUS | Status: DC | PRN

## 2018-07-07 MED ORDER — PIPERACILLIN-TAZOBACTAM 3.375 G IVPB
3.3750 g | Freq: Three times a day (TID) | INTRAVENOUS | Status: DC
  Administered 2018-07-07: 3.375 g via INTRAVENOUS
  Filled 2018-07-07: qty 50

## 2018-07-07 MED ORDER — VANCOMYCIN HCL IN DEXTROSE 1-5 GM/200ML-% IV SOLN: 1000.0000 mg | Freq: Once | INTRAVENOUS | Status: DC

## 2018-07-07 MED ORDER — ACETAMINOPHEN 325 MG PO TABS
650.0000 mg | ORAL_TABLET | Freq: Four times a day (QID) | ORAL | Status: DC | PRN
  Administered 2018-07-07 – 2018-07-08 (×3): 650 mg via ORAL
  Filled 2018-07-07 (×3): qty 2

## 2018-07-07 MED ORDER — SODIUM CHLORIDE 0.9% FLUSH: 3.0000 mL | Freq: Once | INTRAVENOUS | Status: DC

## 2018-07-07 MED ORDER — VANCOMYCIN HCL 1.25 G IV SOLR
1250.0000 mg | Freq: Two times a day (BID) | INTRAVENOUS | Status: DC
  Administered 2018-07-08 – 2018-07-09 (×3): 1250 mg via INTRAVENOUS
  Filled 2018-07-07 (×9): qty 1250

## 2018-07-07 MED ORDER — VANCOMYCIN HCL IN DEXTROSE 1-5 GM/200ML-% IV SOLN
1000.0000 mg | INTRAVENOUS | Status: AC
  Administered 2018-07-07 (×2): 1000 mg via INTRAVENOUS
  Filled 2018-07-07 (×2): qty 200

## 2018-07-07 MED ORDER — FUROSEMIDE 40 MG PO TABS
40.0000 mg | ORAL_TABLET | Freq: Every day | ORAL | Status: DC
  Filled 2018-07-07: qty 1

## 2018-07-07 MED ORDER — ACETAMINOPHEN 325 MG PO TABS
650.0000 mg | ORAL_TABLET | Freq: Once | ORAL | Status: AC
  Administered 2018-07-07: 650 mg via ORAL
  Filled 2018-07-07: qty 2

## 2018-07-07 MED ORDER — SODIUM CHLORIDE 0.9 % IV SOLN: 2.0000 g | Freq: Three times a day (TID) | INTRAVENOUS | Status: DC

## 2018-07-07 NOTE — Progress Notes (Addendum)
Pharmacy Antibiotic Note  Jordan Jensen is a 48 y.o. male admitted on 07/07/2018 with sepsis and infection of unknown source.  Pharmacy has been consulted for zosyn and vancomycin dosing.  Plan:vancomycin 2gm iv x 1  Vancomycin 1250mg  IV every 12 hours.  Goal trough 15-20 mcg/mL. Zosyn 3.375gm iv q8h  Weight: (!) 434 lb (196.9 kg)  Temp (24hrs), Avg:102 F (38.9 C), Min:100.9 F (38.3 C), Max:103 F (39.4 C)  Recent Labs  Lab 07/07/18 1744 07/07/18 1745  WBC 22.5*  --   CREATININE 1.11  --   LATICACIDVEN  --  1.2    Estimated Creatinine Clearance: 141.1 mL/min (by C-G formula based on SCr of 1.11 mg/dL).    Allergies  Allergen Reactions  . Albuterol Shortness Of Breath and Swelling  . Ace Inhibitors Cough  . Latex Itching and Rash    cellulitis    Antimicrobials this admission: 7/1 vancomycin >>  7/1 cefepime >> 7/1 7/1 zosyn >> 7/1 metronidazole x 1   Microbiology results: 7/1 BCx: sent 7/1 Covid 19 sent   Thank you for allowing pharmacy to be a part of this patient's care.  Donna Christen Tranell Wojtkiewicz 07/07/2018 7:46 PM

## 2018-07-07 NOTE — ED Triage Notes (Signed)
Patient reports draining abscess to his back and UTI symptoms. Sent for eval by [facility]. Patient is a paraplegic.
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
## 2018-07-07 NOTE — ED Notes (Signed)
After triage, was determined pt will require further evaluation at ed , pt is sob rate of 26 and O2 sat of 91%, pt requested transport via [facility]. [facility] called and is enroute . Provider made aware
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
## 2018-07-07 NOTE — ED Triage Notes (Signed)
Pt is quadriplegic and has frequent uti . Pt states urine is dark and can feel some sensation of pressure. Pt also has abscess on back
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
## 2018-07-08 DIAGNOSIS — L02212 Cutaneous abscess of back [any part, except buttock]: Secondary | ICD-10-CM

## 2018-07-08 DIAGNOSIS — A419 Sepsis, unspecified organism: Principal | ICD-10-CM

## 2018-07-08 LAB — COMPREHENSIVE METABOLIC PANEL
ALT: 17 U/L (ref 0–44)
AST: 20 U/L (ref 15–41)
Albumin: 3.3 g/dL — ABNORMAL LOW (ref 3.5–5.0)
Alkaline Phosphatase: 45 U/L (ref 38–126)
Anion gap: 10 (ref 5–15)
BUN: 9 mg/dL (ref 6–20)
CO2: 26 mmol/L (ref 22–32)
Calcium: 8.8 mg/dL — ABNORMAL LOW (ref 8.9–10.3)
Chloride: 102 mmol/L (ref 98–111)
Creatinine, Ser: 1.01 mg/dL (ref 0.61–1.24)
GFR calc Af Amer: >60 mL/min (ref >60)
GFR calc non Af Amer: >60 mL/min (ref >60)
Glucose, Bld: 110 mg/dL — ABNORMAL HIGH (ref 70–99)
Potassium: 3.9 mmol/L (ref 3.5–5.1)
Sodium: 138 mmol/L (ref 135–145)
Total Bilirubin: 1.3 mg/dL — ABNORMAL HIGH (ref 0.3–1.2)
Total Protein: 6.9 g/dL (ref 6.5–8.1)

## 2018-07-08 LAB — CBC
HCT: 41.3 % (ref 39.0–52.0)
Hemoglobin: 13.3 g/dL (ref 13.0–17.0)
MCH: 30 pg (ref 26.0–34.0)
MCHC: 32.2 g/dL (ref 30.0–36.0)
MCV: 93 fL (ref 80.0–100.0)
Platelets: 170 10*3/uL (ref 150–400)
RBC: 4.44 MIL/uL (ref 4.22–5.81)
RDW: 12.6 % (ref 11.5–15.5)
WBC: 21.3 10*3/uL — ABNORMAL HIGH (ref 4.0–10.5)
nRBC: 0 % (ref 0.0–0.2)

## 2018-07-08 LAB — HEMOGLOBIN A1C
Hgb A1c MFr Bld: 5.4 % (ref 4.8–5.6)
Mean Plasma Glucose: 108.28 mg/dL

## 2018-07-08 MED ORDER — LIDOCAINE HCL (PF) 1 % IJ SOLN
INTRAMUSCULAR | Status: AC
  Administered 2018-07-08: 17:00:00
  Filled 2018-07-08: qty 10
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
## 2018-07-08 NOTE — Plan of Care (Signed)

## 2018-07-09 LAB — CBC
HCT: 39 % (ref 39.0–52.0)
Hemoglobin: 12.6 g/dL — ABNORMAL LOW (ref 13.0–17.0)
MCH: 30.1 pg (ref 26.0–34.0)
MCHC: 32.3 g/dL (ref 30.0–36.0)
MCV: 93.1 fL (ref 80.0–100.0)
Platelets: 169 10*3/uL (ref 150–400)
RBC: 4.19 MIL/uL — ABNORMAL LOW (ref 4.22–5.81)
RDW: 12.5 % (ref 11.5–15.5)
WBC: 13.5 10*3/uL — ABNORMAL HIGH (ref 4.0–10.5)
nRBC: 0 % (ref 0.0–0.2)

## 2018-07-09 LAB — BASIC METABOLIC PANEL
Anion gap: 7 (ref 5–15)
BUN: 9 mg/dL (ref 6–20)
CO2: 29 mmol/L (ref 22–32)
Calcium: 8.7 mg/dL — ABNORMAL LOW (ref 8.9–10.3)
Chloride: 101 mmol/L (ref 98–111)
Creatinine, Ser: 0.97 mg/dL (ref 0.61–1.24)
GFR calc Af Amer: >60 mL/min (ref >60)
GFR calc non Af Amer: >60 mL/min (ref >60)
Glucose, Bld: 106 mg/dL — ABNORMAL HIGH (ref 70–99)
Potassium: 4.2 mmol/L (ref 3.5–5.1)
Sodium: 137 mmol/L (ref 135–145)

## 2018-07-09 MED ORDER — DOXYCYCLINE HYCLATE 100 MG PO TABS
100.0000 mg | ORAL_TABLET | Freq: Two times a day (BID) | ORAL | Status: DC
  Administered 2018-07-09 – 2018-07-10 (×2): 100 mg via ORAL
  Filled 2018-07-09 (×2): qty 1

## 2018-07-09 NOTE — Progress Notes (Signed)
[facility]  Packing removed and bled.  Patient tolerated ok. Due to the bleeding, he needs to stay overnight to have RN change again tonight. I will remove packing tomorrow and he can likely go home.     [name], MD [facility] [address] [place], [place] [address] [phone] (office)

## 2018-07-09 NOTE — Care Management Important Message (Signed)
Important Message  Patient Details  Name: Jordan Jensen MRN: [id number] Date of Birth: September 02, 1970   Medicare Important Message Given:  Yes     Tommy Medal 07/09/2018, 11:51 AM
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
## 2018-07-10 LAB — CBC
HCT: 41 % (ref 39.0–52.0)
Hemoglobin: 13 g/dL (ref 13.0–17.0)
MCH: 29.4 pg (ref 26.0–34.0)
MCHC: 31.7 g/dL (ref 30.0–36.0)
MCV: 92.8 fL (ref 80.0–100.0)
Platelets: 201 10*3/uL (ref 150–400)
RBC: 4.42 MIL/uL (ref 4.22–5.81)
RDW: 12.5 % (ref 11.5–15.5)
WBC: 10.5 10*3/uL (ref 4.0–10.5)
nRBC: 0 % (ref 0.0–0.2)

## 2018-07-10 LAB — HIV ANTIBODY (ROUTINE TESTING W REFLEX): HIV Screen 4th Generation wRfx: NONREACTIVE

## 2018-07-10 MED ORDER — DOXYCYCLINE HYCLATE 100 MG PO TABS: 100.0000 mg | ORAL_TABLET | Freq: Two times a day (BID) | ORAL | 0 refills | Status: AC

## 2018-07-10 MED ORDER — ENOXAPARIN SODIUM 100 MG/ML ~~LOC~~ SOLN: 95.0000 mg | SUBCUTANEOUS | Status: DC

## 2018-07-10 NOTE — Progress Notes (Signed)
Nsg Discharge Note  Admit Date:  07/07/2018 Discharge date: 07/10/2018   [name] to be D/C'd Home per MD order.  AVS completed.  Copy for chart, and copy for patient signed, and dated. Removed IV-clean, dry, intact. Reviewed d/c paperwork with patient. Answered all questions. Patient/caregiver able to verbalize understanding.  Discharge Medication: Allergies as of 07/10/2018      Reactions   Albuterol Shortness Of Breath, Swelling   Ace Inhibitors Cough   Latex Itching, Rash   cellulitis      Medication List    TAKE these medications   acetaminophen 325 MG tablet Commonly known as: TYLENOL Take 2 tablets (650 mg total) by mouth every 6 (six) hours as needed for mild pain (or Fever >/= 101).   doxycycline 100 MG tablet Commonly known as: VIBRA-TABS Take 1 tablet (100 mg total) by mouth every 12 (twelve) hours for 10 days.   furosemide 40 MG tablet Commonly known as: LASIX Take 1 tablet (40 mg total) by mouth daily as needed for fluid.   metoprolol succinate 25 MG 24 hr tablet Commonly known as: TOPROL-XL Take 0.5 tablets (12.5 mg total) by mouth daily.   sacubitril-valsartan 24-26 MG Commonly known as: ENTRESTO Take 1 tablet by mouth 2 (two) times daily.       Discharge Assessment: Vitals:   07/10/18 0528 07/10/18 0533  BP: (!) 104/56   Pulse: (!) 42 (!) 50  Resp: 18   Temp: 97.9 F (36.6 C)   SpO2: 96%    Skin clean, dry and intact without evidence of skin break down, no evidence of skin tears noted. IV catheter discontinued intact. Site without signs and symptoms of complications - no redness or edema noted at insertion site, patient denies c/o pain - only slight tenderness at site.  Dressing with slight pressure applied.  D/c Instructions-Education: Discharge instructions given to patient/family with verbalized understanding. D/c education completed with patient/family including follow up instructions, medication list, d/c activities limitations if  indicated, with other d/c instructions as indicated by MD - patient able to verbalize understanding, all questions fully answered. Patient instructed to return to ED, call 911, or call MD for any changes in condition.  Patient escorted via [place], and D/C home via private auto.  Santa Lighter, RN 07/10/2018 12:12 PM

## 2018-07-10 NOTE — Progress Notes (Signed)
[facility]  Packing removed. No bleeding. Patient tolerated.  Keep the area clean with soap and water around the wound.  Keep a dry dressing over the area as it heals.  Expect bloody drainage. Change the dressing if it gets too soiled. You can buy Kotex/ Cotton Feminine Maxi pads to use and tape them to your back.  They are cheaper and work just as well for drainage.  The wound will take several weeks to fully heal from the inside out.  I will call him Wednesday July 8 to check on him. No in person appointment at this time.   [name], MD [facility] [address] [place], [place] [address] [phone] (office)
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
## 2018-07-10 NOTE — Plan of Care (Signed)
  Problem: Education: Goal: Knowledge of General Education information will improve Description Including pain rating scale, medication(s)/side effects and non-pharmacologic comfort measures Outcome: Progressing   

## 2018-07-10 NOTE — Discharge Instructions (Signed)
Keep the area clean with soap and water around the wound.  Keep a dry dressing over the area as it heals.  Expect bloody drainage. Change the dressing if it gets too soiled. You can buy Kotex/ Cotton Feminine Maxi pads to use and tape them to your back.  They are cheaper and work just as well for drainage.  The wound will take several weeks to fully heal from the inside out.

## 2018-07-12 LAB — CULTURE, BLOOD (ROUTINE X 2)
Culture: NO GROWTH
Culture: NO GROWTH
Special Requests: ADEQUATE
Special Requests: ADEQUATE

## 2018-07-13 ENCOUNTER — Other Ambulatory Visit: Payer: Self-pay

## 2018-07-13 NOTE — Patient Outreach (Signed)
[facility]) Care Management  07/13/2018  Jordan Jensen 11-04-1970 [id number]  EMMI: general discharge red alert Referral date: 7/7/20Referral reason: Insurance:  [employer] Day # 1  Telephone call to patient regarding EMMI general discharge red alert. HIPAA verified with patient. RNCM introduced herself and explained reason for call.  .Patient states he is doing well. Patient states he has not scheduled a follow up visit with the doctor. He states the surgeon stated she would follow up with patient today 07/13/18.  Patient states he was advised regarding cleaning and bandaging the wound.  Patient states his wife is helping him manage the wound care and dressing changes.  RNCM states he is taking his antibiotic as prescribed.  Patient denies having any side effects from the medication.  Patient states he has had a "couple" of virtual phone visits within the past several months with his primary care provider. Patient states he has transportation assistance if he needs to be seen at the doctors office. RNCM advised patient to follow up with his primaryMD regarding recent admission.  RNCM reviewed signs/ symptoms of infection with patient. Patient verbalized understanding and denied any of these symptoms being present.  RNCM advised patient he has access to the 24 hour nurse advise line.  Patient requested contact phone number for 24 hour advise line be left on his answering service because he is unable to take the number at time of call.   RNCM advised patient to notify MD of any changes in condition prior to scheduled appointment. RNCM verified patient aware of 911 services for urgent/ emergent needs. RNCM attempted to call patient and leave 24 hour nurse advise line number on his voice mail.  Unable to leave message due to patients voice mail being full.   PLAN: RNCM will close case due to patient being assessed and having no further needs.  RNCm will mail patient [facility] care management  brochure with 24 hour nurse advise line number due to patients voice mail being full.   Quinn Plowman RN,BSN,CCM [facility] Telephonic  [phone]

## 2018-07-14 ENCOUNTER — Telehealth: Payer: Self-pay | Admitting: General Surgery

## 2018-07-14 NOTE — Telephone Encounter (Signed)
[facility]  Called Jordan Jensen to see how he was doing. He says the wound is still draining but draining less. Changing the dressing twice a day. Taking antibiotics.  Discussed that this will slowly heal. Overall doing fair.   Patient number if he needs [place] to see [place] in clinic.  [name], MD [facility] [address] [place], [place] [phone] (office)

## 2018-07-16 ENCOUNTER — Other Ambulatory Visit: Payer: Self-pay

## 2018-07-16 NOTE — Patient Outreach (Signed)
[facility]) Care Management  07/16/2018  EJAY LASHLEY 1970/11/04 [id number]  EMMI: general discharge red alert  Referral date: 07/16/18 Referral reason: scheduled follow up: no Insurance: [employer] Day # 4  Telephone call to patient regarding EMMI general discharge red alert. HIPAA verified by patient. RNCM explained reason for call.  Patient states he had a phone appointment with the doctor that did his procedure. Patient states he is doing fine. He states the wound on his back is healing well. Patient states he has the dressings changed 2 time per day with the assistance of his wife. Patient denies any pain. He reports he is still taking his antibiotic. Denies any side effects from  His medication.  Patient states he knows to follow up with the surgeon if there is any signs of infections.  RNCM advised patient to follow up with his primary MD if needed for symptoms/ concerns as well. Patient verbalized understanding. Patient denied any further needs or concerns at this time.  RNCM advised patient to notify MD of any changes in condition prior to scheduled appointment. RNCM verified patient aware of 911 services for urgent/ emergent needs.  PLAN: RNCM will close case due to patient being assessed and having no further needs.   Quinn Plowman RN,BSN,CCM [facility] Telephonic  [phone]

## 2018-08-19 ENCOUNTER — Telehealth: Payer: Self-pay | Admitting: Cardiovascular Disease

## 2018-08-19 NOTE — Telephone Encounter (Signed)

## 2018-08-25 ENCOUNTER — Telehealth (INDEPENDENT_AMBULATORY_CARE_PROVIDER_SITE_OTHER): Payer: Medicare HMO | Admitting: Cardiovascular Disease

## 2018-08-25 ENCOUNTER — Telehealth: Payer: Self-pay | Admitting: Licensed Clinical Social Worker

## 2018-08-25 ENCOUNTER — Encounter: Payer: Self-pay | Admitting: Cardiovascular Disease

## 2018-08-25 VITALS — Ht 71.0 in

## 2018-08-25 DIAGNOSIS — I5022 Chronic systolic (congestive) heart failure: Secondary | ICD-10-CM

## 2018-08-25 DIAGNOSIS — I428 Other cardiomyopathies: Secondary | ICD-10-CM | POA: Diagnosis not present

## 2018-08-25 NOTE — Patient Instructions (Signed)
Medication Instructions: Your physician recommends that you continue on your current medications as directed. Please refer to the Current Medication list given to you today.   Labwork: None today  Procedures/Testing: None today  Follow-Up: 6 months Virtual Visit with Physician Assistant   Any Additional Special Instructions Will Be Listed Below (If Applicable).     If you need a refill on your cardiac medications before your next appointment, please call your pharmacy.      Thank you for choosing [facility] !

## 2018-08-25 NOTE — Telephone Encounter (Signed)
CSW referred to assist patient with obtaining a scale and BP cuff. CSW contacted patient to inform cuff and scale will be delivered to home. Patient grateful for support and assistance. CSW available as needed. Jackie Haislee Corso, LCSW, CCSW-MCS [phone] 
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
## 2018-09-14 ENCOUNTER — Encounter: Payer: Self-pay | Admitting: Family Medicine

## 2018-09-14 ENCOUNTER — Other Ambulatory Visit: Payer: Self-pay

## 2018-09-14 ENCOUNTER — Ambulatory Visit (INDEPENDENT_AMBULATORY_CARE_PROVIDER_SITE_OTHER): Payer: Medicare HMO | Admitting: Family Medicine

## 2018-09-14 DIAGNOSIS — M25572 Pain in left ankle and joints of left foot: Secondary | ICD-10-CM

## 2018-09-14 DIAGNOSIS — M25571 Pain in right ankle and joints of right foot: Secondary | ICD-10-CM | POA: Diagnosis not present

## 2018-09-14 DIAGNOSIS — M25579 Pain in unspecified ankle and joints of unspecified foot: Secondary | ICD-10-CM | POA: Insufficient documentation

## 2018-09-14 NOTE — Assessment & Plan Note (Addendum)
Patient reports bilateral ankle pain for 3 months that is worse with standing.  Patient reports using crutches still ambulate around the home and that when he stands he has pain in his Achilles region of bilateral ankles.  Patient denies any recent trauma or falls to the area and does not get relief with Tylenol or NSAIDs.  Patient reports that pain is alleviated by massage, soaking them in warm water with Epson salt and relieve pressure from fluid in the setting of his lymphedema as well as frequent movement and exercise seems to help ease the pain.  -Encourage patient to continue ankle exercises to help with pain (instructions with images given) -continue to soak feet and ankles to help with pain  -referral to home health physical therapy

## 2018-09-14 NOTE — Progress Notes (Signed)
  Patient Name: Jordan Jensen Date of Birth: 01/11/1970 Date of Visit: 09/14/18 PCP: Stark Klein, MD  Chief Complaint: ankle pain with standing   Subjective: Jordan Jensen is a pleasant 48 y.o. with medical history significant for CHF, HTN, spinal cord injury c5-c7, bilateral leg edema and morbid obesity with BMI 60-69 presenting today for ankle pain with standing.   Patient reports that has been having pain pain that started in his left ankle and is now progressed to his right ankle that occurs with applying pressure to stand and walk.  Patient reports using crutches to walk around in his home and uses a wheelchair when outside the home and traveling long distances.  Jordan Jensen reports that the pain is primarily focused in his Achilles portions of his ankles.  The pain is alleviated with soaking his feet in warm water and Epson salt, compression sleeves on his legs and better with massages.  He states that he thinks using a compression-like wrap on his ankle would help.  Patient states that he has tried to stay off of his feet 4 days and will still have the pain.  Pain is not alleviated by Tylenol or NSAIDs.  This pain has been occurring for3 months now.  Patient denies pain past his ankle joints and denies any paresthesias or numbness in his feet.  Patient denies any falls due to ankle pain.   ROS: Per HPI.   I have reviewed the patient's medical, surgical, family, and social history as appropriate.  Vitals:   09/14/18 1112  BP: 112/72  Pulse: (!) 47  SpO2: 97%    Physical exam: General: Alert and cooperative and appears to be in no acute distress Cardio:  Rhythm is regular. Patient appears to have systolic mumur.   Pulm: Clear to auscultation bilaterally, no crackles, wheezing, or diminished breath sounds. Normal respiratory effort Abdomen: Bowel sounds normal . Obese Abdomen that is soft and non-tender.  Extremities: Bilateral 2+ peripheral edema. Warm with no signs of diminished  circulation.  Unable to assess for pulses secondary to lymphedema, notable for dry skin of ankles, no sign of trauma to ankles Neuro: Alert and oriented x3   Ankle pain Patient reports bilateral ankle pain for 3 months that is worse with standing.  Patient reports using crutches still ambulate around the home and that when he stands he has pain in his Achilles region of bilateral ankles.  Patient denies any recent trauma or falls to the area and does not get relief with Tylenol or NSAIDs.  Patient reports that pain is alleviated by massage, soaking them in warm water with Epson salt and relieve pressure from fluid in the setting of his lymphedema as well as frequent movement and exercise seems to help ease the pain.  -Encourage patient to continue ankle exercises to help with pain (instructions with images given) -continue to soak feet and ankles to help with pain  -referral to home health physical therapy   Patient requested letter to state that he is cleared medically to attend classes and complete school work as he attends summer school.    Return to care in 1 month for follow up.   Stark Klein, MD  Family Medicine

## 2018-09-14 NOTE — Patient Instructions (Addendum)
Thank you for allowing me to take part in your care!   Today we discussed:   1. Pressure in your ankles with standing.      I will encourage you to continue soaking your ankles and warm water with Epson salt in massaging your ankles as much as possible.  Please continue to try exercises to keep your ankles and movement in order to assist with pain.   I have placed a referral for home health physical therapy as well to further assist with strengthening exercises for your ankles.  I have also printed a letter stating that you are clear to attend classes and complete school work.   Please make an appointment to follow up with me in 1 month.   Thank you,  Dr. Rosita Fire

## 2018-09-16 DIAGNOSIS — I5022 Chronic systolic (congestive) heart failure: Secondary | ICD-10-CM | POA: Diagnosis not present

## 2018-09-16 DIAGNOSIS — G8250 Quadriplegia, unspecified: Secondary | ICD-10-CM | POA: Diagnosis not present

## 2018-09-16 DIAGNOSIS — S14107S Unspecified injury at C7 level of cervical spinal cord, sequela: Secondary | ICD-10-CM | POA: Diagnosis not present

## 2018-09-16 DIAGNOSIS — I872 Venous insufficiency (chronic) (peripheral): Secondary | ICD-10-CM | POA: Diagnosis not present

## 2018-09-16 DIAGNOSIS — I890 Lymphedema, not elsewhere classified: Secondary | ICD-10-CM | POA: Diagnosis not present

## 2018-09-16 DIAGNOSIS — M25572 Pain in left ankle and joints of left foot: Secondary | ICD-10-CM | POA: Diagnosis not present

## 2018-09-16 DIAGNOSIS — I110 Hypertensive heart disease with heart failure: Secondary | ICD-10-CM | POA: Diagnosis not present

## 2018-09-16 DIAGNOSIS — M25571 Pain in right ankle and joints of right foot: Secondary | ICD-10-CM | POA: Diagnosis not present

## 2018-09-20 DIAGNOSIS — I872 Venous insufficiency (chronic) (peripheral): Secondary | ICD-10-CM | POA: Diagnosis not present

## 2018-09-20 DIAGNOSIS — I890 Lymphedema, not elsewhere classified: Secondary | ICD-10-CM | POA: Diagnosis not present

## 2018-09-20 DIAGNOSIS — I5022 Chronic systolic (congestive) heart failure: Secondary | ICD-10-CM | POA: Diagnosis not present

## 2018-09-20 DIAGNOSIS — M25571 Pain in right ankle and joints of right foot: Secondary | ICD-10-CM | POA: Diagnosis not present

## 2018-09-20 DIAGNOSIS — S14107S Unspecified injury at C7 level of cervical spinal cord, sequela: Secondary | ICD-10-CM | POA: Diagnosis not present

## 2018-09-20 DIAGNOSIS — I110 Hypertensive heart disease with heart failure: Secondary | ICD-10-CM | POA: Diagnosis not present

## 2018-09-20 DIAGNOSIS — M25572 Pain in left ankle and joints of left foot: Secondary | ICD-10-CM | POA: Diagnosis not present

## 2018-09-20 DIAGNOSIS — G8250 Quadriplegia, unspecified: Secondary | ICD-10-CM | POA: Diagnosis not present

## 2018-09-21 ENCOUNTER — Telehealth: Payer: Self-pay | Admitting: *Deleted

## 2018-09-21 DIAGNOSIS — M25571 Pain in right ankle and joints of right foot: Secondary | ICD-10-CM | POA: Diagnosis not present

## 2018-09-21 DIAGNOSIS — I890 Lymphedema, not elsewhere classified: Secondary | ICD-10-CM | POA: Diagnosis not present

## 2018-09-21 DIAGNOSIS — I110 Hypertensive heart disease with heart failure: Secondary | ICD-10-CM | POA: Diagnosis not present

## 2018-09-21 DIAGNOSIS — M25572 Pain in left ankle and joints of left foot: Secondary | ICD-10-CM | POA: Diagnosis not present

## 2018-09-21 DIAGNOSIS — S14107S Unspecified injury at C7 level of cervical spinal cord, sequela: Secondary | ICD-10-CM | POA: Diagnosis not present

## 2018-09-21 DIAGNOSIS — I5022 Chronic systolic (congestive) heart failure: Secondary | ICD-10-CM | POA: Diagnosis not present

## 2018-09-21 DIAGNOSIS — I872 Venous insufficiency (chronic) (peripheral): Secondary | ICD-10-CM | POA: Diagnosis not present

## 2018-09-21 DIAGNOSIS — G8250 Quadriplegia, unspecified: Secondary | ICD-10-CM | POA: Diagnosis not present

## 2018-09-21 NOTE — Telephone Encounter (Signed)
Jordan Jensen, PT at the number provided and provided verbal orders for physical therapy for this patient as requested.

## 2018-09-21 NOTE — Telephone Encounter (Signed)
Dina from [facility] calling for PT verbal orders as follows:  2 time(s) weekly for 4 week(s), then 1 time(s) weekly for 2 week(s)  You can leave verbal orders on confidential voicemail.  Christen Bame, CMA

## 2018-09-23 DIAGNOSIS — M25571 Pain in right ankle and joints of right foot: Secondary | ICD-10-CM | POA: Diagnosis not present

## 2018-09-23 DIAGNOSIS — I890 Lymphedema, not elsewhere classified: Secondary | ICD-10-CM | POA: Diagnosis not present

## 2018-09-23 DIAGNOSIS — G8250 Quadriplegia, unspecified: Secondary | ICD-10-CM | POA: Diagnosis not present

## 2018-09-23 DIAGNOSIS — I872 Venous insufficiency (chronic) (peripheral): Secondary | ICD-10-CM | POA: Diagnosis not present

## 2018-09-23 DIAGNOSIS — I110 Hypertensive heart disease with heart failure: Secondary | ICD-10-CM | POA: Diagnosis not present

## 2018-09-23 DIAGNOSIS — S14107S Unspecified injury at C7 level of cervical spinal cord, sequela: Secondary | ICD-10-CM | POA: Diagnosis not present

## 2018-09-23 DIAGNOSIS — M25572 Pain in left ankle and joints of left foot: Secondary | ICD-10-CM | POA: Diagnosis not present

## 2018-09-23 DIAGNOSIS — I5022 Chronic systolic (congestive) heart failure: Secondary | ICD-10-CM | POA: Diagnosis not present

## 2018-09-26 ENCOUNTER — Encounter (HOSPITAL_COMMUNITY): Payer: Self-pay

## 2018-09-26 ENCOUNTER — Ambulatory Visit (HOSPITAL_COMMUNITY)
Admission: EM | Admit: 2018-09-26 | Discharge: 2018-09-26 | Disposition: A | Discharge status: Home / Self Care | Payer: Medicare HMO | Attending: Family Medicine | Admitting: Family Medicine

## 2018-09-26 ENCOUNTER — Other Ambulatory Visit: Payer: Self-pay

## 2018-09-26 DIAGNOSIS — R300 Dysuria: Secondary | ICD-10-CM | POA: Insufficient documentation

## 2018-09-26 LAB — POCT URINALYSIS DIP (DEVICE)
Glucose, UA: NEGATIVE mg/dL
Hgb urine dipstick: NEGATIVE
Ketones, ur: NEGATIVE mg/dL
Leukocytes,Ua: NEGATIVE
Nitrite: NEGATIVE
Protein, ur: 30 mg/dL — AB
Specific Gravity, Urine: >=1.03 (ref 1.005–1.030)
Urobilinogen, UA: 1 mg/dL (ref 0.0–1.0)
pH: 6 (ref 5.0–8.0)

## 2018-09-26 MED ORDER — CIPROFLOXACIN HCL 500 MG PO TABS: 500.0000 mg | ORAL_TABLET | Freq: Two times a day (BID) | ORAL | 0 refills | Status: AC

## 2018-09-26 MED ORDER — CIPROFLOXACIN HCL 500 MG PO TABS: 500.0000 mg | ORAL_TABLET | Freq: Two times a day (BID) | ORAL | 0 refills | Status: DC
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
## 2018-09-26 NOTE — Discharge Instructions (Addendum)
Take the medication as prescribed and follow up with your doctor this week. Push fluids.

## 2018-09-26 NOTE — ED Triage Notes (Signed)
Pt thinks he may have a UTI. Pt states the urine is cloudy and it burns when he voids . Pt states he has had lower back pain x 3 days.

## 2018-09-27 LAB — URINE CULTURE: Culture: NO GROWTH

## 2018-09-30 DIAGNOSIS — I5022 Chronic systolic (congestive) heart failure: Secondary | ICD-10-CM | POA: Diagnosis not present

## 2018-09-30 DIAGNOSIS — M25572 Pain in left ankle and joints of left foot: Secondary | ICD-10-CM | POA: Diagnosis not present

## 2018-09-30 DIAGNOSIS — G8250 Quadriplegia, unspecified: Secondary | ICD-10-CM | POA: Diagnosis not present

## 2018-09-30 DIAGNOSIS — I110 Hypertensive heart disease with heart failure: Secondary | ICD-10-CM | POA: Diagnosis not present

## 2018-09-30 DIAGNOSIS — S14107S Unspecified injury at C7 level of cervical spinal cord, sequela: Secondary | ICD-10-CM | POA: Diagnosis not present

## 2018-09-30 DIAGNOSIS — I872 Venous insufficiency (chronic) (peripheral): Secondary | ICD-10-CM | POA: Diagnosis not present

## 2018-09-30 DIAGNOSIS — M25571 Pain in right ankle and joints of right foot: Secondary | ICD-10-CM | POA: Diagnosis not present

## 2018-09-30 DIAGNOSIS — I890 Lymphedema, not elsewhere classified: Secondary | ICD-10-CM | POA: Diagnosis not present

## 2018-10-04 DIAGNOSIS — I890 Lymphedema, not elsewhere classified: Secondary | ICD-10-CM | POA: Diagnosis not present

## 2018-10-04 DIAGNOSIS — I5022 Chronic systolic (congestive) heart failure: Secondary | ICD-10-CM | POA: Diagnosis not present

## 2018-10-04 DIAGNOSIS — M25571 Pain in right ankle and joints of right foot: Secondary | ICD-10-CM | POA: Diagnosis not present

## 2018-10-04 DIAGNOSIS — I110 Hypertensive heart disease with heart failure: Secondary | ICD-10-CM | POA: Diagnosis not present

## 2018-10-04 DIAGNOSIS — I872 Venous insufficiency (chronic) (peripheral): Secondary | ICD-10-CM | POA: Diagnosis not present

## 2018-10-04 DIAGNOSIS — M25572 Pain in left ankle and joints of left foot: Secondary | ICD-10-CM | POA: Diagnosis not present

## 2018-10-04 DIAGNOSIS — G8250 Quadriplegia, unspecified: Secondary | ICD-10-CM | POA: Diagnosis not present

## 2018-10-04 DIAGNOSIS — S14107S Unspecified injury at C7 level of cervical spinal cord, sequela: Secondary | ICD-10-CM | POA: Diagnosis not present

## 2018-10-08 ENCOUNTER — Telehealth: Payer: Self-pay

## 2018-10-08 NOTE — Telephone Encounter (Signed)
Jenny Reichmann, from [employer], calls nurse line checking the status of paper for patient for compression stockings. I did not see any notes in chart, and if PCP already signed off and faxed, they never received it. Jenny Reichmann is faxing over again, please sign and return to Page.I will attach office notes.   Fax: [phone]

## 2018-10-13 DIAGNOSIS — I5022 Chronic systolic (congestive) heart failure: Secondary | ICD-10-CM | POA: Diagnosis not present

## 2018-10-13 DIAGNOSIS — S14107S Unspecified injury at C7 level of cervical spinal cord, sequela: Secondary | ICD-10-CM | POA: Diagnosis not present

## 2018-10-13 DIAGNOSIS — M25571 Pain in right ankle and joints of right foot: Secondary | ICD-10-CM | POA: Diagnosis not present

## 2018-10-13 DIAGNOSIS — I872 Venous insufficiency (chronic) (peripheral): Secondary | ICD-10-CM | POA: Diagnosis not present

## 2018-10-13 DIAGNOSIS — G8250 Quadriplegia, unspecified: Secondary | ICD-10-CM | POA: Diagnosis not present

## 2018-10-13 DIAGNOSIS — M25572 Pain in left ankle and joints of left foot: Secondary | ICD-10-CM | POA: Diagnosis not present

## 2018-10-13 DIAGNOSIS — I890 Lymphedema, not elsewhere classified: Secondary | ICD-10-CM | POA: Diagnosis not present

## 2018-10-13 DIAGNOSIS — I110 Hypertensive heart disease with heart failure: Secondary | ICD-10-CM | POA: Diagnosis not present

## 2018-10-20 DIAGNOSIS — I110 Hypertensive heart disease with heart failure: Secondary | ICD-10-CM | POA: Diagnosis not present

## 2018-10-20 DIAGNOSIS — I890 Lymphedema, not elsewhere classified: Secondary | ICD-10-CM | POA: Diagnosis not present

## 2018-10-20 DIAGNOSIS — S14107S Unspecified injury at C7 level of cervical spinal cord, sequela: Secondary | ICD-10-CM | POA: Diagnosis not present

## 2018-10-20 DIAGNOSIS — I872 Venous insufficiency (chronic) (peripheral): Secondary | ICD-10-CM | POA: Diagnosis not present

## 2018-10-20 DIAGNOSIS — I5022 Chronic systolic (congestive) heart failure: Secondary | ICD-10-CM | POA: Diagnosis not present

## 2018-10-20 DIAGNOSIS — M25572 Pain in left ankle and joints of left foot: Secondary | ICD-10-CM | POA: Diagnosis not present

## 2018-10-20 DIAGNOSIS — G8250 Quadriplegia, unspecified: Secondary | ICD-10-CM | POA: Diagnosis not present

## 2018-10-20 DIAGNOSIS — M25571 Pain in right ankle and joints of right foot: Secondary | ICD-10-CM | POA: Diagnosis not present

## 2018-10-21 DIAGNOSIS — I110 Hypertensive heart disease with heart failure: Secondary | ICD-10-CM | POA: Diagnosis not present

## 2018-10-21 DIAGNOSIS — G8250 Quadriplegia, unspecified: Secondary | ICD-10-CM | POA: Diagnosis not present

## 2018-10-21 DIAGNOSIS — M25571 Pain in right ankle and joints of right foot: Secondary | ICD-10-CM | POA: Diagnosis not present

## 2018-10-21 DIAGNOSIS — M25572 Pain in left ankle and joints of left foot: Secondary | ICD-10-CM | POA: Diagnosis not present

## 2018-10-21 DIAGNOSIS — S14107S Unspecified injury at C7 level of cervical spinal cord, sequela: Secondary | ICD-10-CM | POA: Diagnosis not present

## 2018-10-21 DIAGNOSIS — I872 Venous insufficiency (chronic) (peripheral): Secondary | ICD-10-CM | POA: Diagnosis not present

## 2018-10-21 DIAGNOSIS — I5022 Chronic systolic (congestive) heart failure: Secondary | ICD-10-CM | POA: Diagnosis not present

## 2018-10-21 DIAGNOSIS — I890 Lymphedema, not elsewhere classified: Secondary | ICD-10-CM | POA: Diagnosis not present

## 2018-10-29 ENCOUNTER — Ambulatory Visit: Payer: Medicare HMO | Admitting: Family Medicine

## 2018-10-29 DIAGNOSIS — Z125 Encounter for screening for malignant neoplasm of prostate: Secondary | ICD-10-CM | POA: Diagnosis not present

## 2018-10-29 DIAGNOSIS — G8929 Other chronic pain: Secondary | ICD-10-CM | POA: Diagnosis not present

## 2018-10-29 DIAGNOSIS — F17211 Nicotine dependence, cigarettes, in remission: Secondary | ICD-10-CM | POA: Diagnosis not present

## 2018-10-29 DIAGNOSIS — M25572 Pain in left ankle and joints of left foot: Secondary | ICD-10-CM | POA: Diagnosis not present

## 2018-10-29 DIAGNOSIS — I890 Lymphedema, not elsewhere classified: Secondary | ICD-10-CM | POA: Diagnosis not present

## 2018-10-29 DIAGNOSIS — I509 Heart failure, unspecified: Secondary | ICD-10-CM | POA: Diagnosis not present

## 2018-10-29 DIAGNOSIS — Z1159 Encounter for screening for other viral diseases: Secondary | ICD-10-CM | POA: Diagnosis not present

## 2018-10-29 DIAGNOSIS — Z79899 Other long term (current) drug therapy: Secondary | ICD-10-CM | POA: Diagnosis not present

## 2018-10-29 DIAGNOSIS — M25571 Pain in right ankle and joints of right foot: Secondary | ICD-10-CM | POA: Diagnosis not present

## 2018-11-05 DIAGNOSIS — G8250 Quadriplegia, unspecified: Secondary | ICD-10-CM | POA: Diagnosis not present

## 2018-11-05 DIAGNOSIS — S14107S Unspecified injury at C7 level of cervical spinal cord, sequela: Secondary | ICD-10-CM | POA: Diagnosis not present

## 2018-11-05 DIAGNOSIS — I890 Lymphedema, not elsewhere classified: Secondary | ICD-10-CM | POA: Diagnosis not present

## 2018-11-05 DIAGNOSIS — I110 Hypertensive heart disease with heart failure: Secondary | ICD-10-CM | POA: Diagnosis not present

## 2018-11-05 DIAGNOSIS — M25571 Pain in right ankle and joints of right foot: Secondary | ICD-10-CM | POA: Diagnosis not present

## 2018-11-05 DIAGNOSIS — M25572 Pain in left ankle and joints of left foot: Secondary | ICD-10-CM | POA: Diagnosis not present

## 2018-11-05 DIAGNOSIS — I5022 Chronic systolic (congestive) heart failure: Secondary | ICD-10-CM | POA: Diagnosis not present

## 2018-11-05 DIAGNOSIS — I872 Venous insufficiency (chronic) (peripheral): Secondary | ICD-10-CM | POA: Diagnosis not present

## 2018-11-09 DIAGNOSIS — I872 Venous insufficiency (chronic) (peripheral): Secondary | ICD-10-CM | POA: Diagnosis not present

## 2018-11-09 DIAGNOSIS — I890 Lymphedema, not elsewhere classified: Secondary | ICD-10-CM | POA: Diagnosis not present

## 2018-11-09 DIAGNOSIS — G8250 Quadriplegia, unspecified: Secondary | ICD-10-CM | POA: Diagnosis not present

## 2018-11-09 DIAGNOSIS — S14107S Unspecified injury at C7 level of cervical spinal cord, sequela: Secondary | ICD-10-CM | POA: Diagnosis not present

## 2018-11-09 DIAGNOSIS — M25571 Pain in right ankle and joints of right foot: Secondary | ICD-10-CM | POA: Diagnosis not present

## 2018-11-09 DIAGNOSIS — I110 Hypertensive heart disease with heart failure: Secondary | ICD-10-CM | POA: Diagnosis not present

## 2018-11-09 DIAGNOSIS — M25572 Pain in left ankle and joints of left foot: Secondary | ICD-10-CM | POA: Diagnosis not present

## 2018-11-09 DIAGNOSIS — I5022 Chronic systolic (congestive) heart failure: Secondary | ICD-10-CM | POA: Diagnosis not present

## 2018-11-30 ENCOUNTER — Other Ambulatory Visit: Payer: Self-pay

## 2018-11-30 ENCOUNTER — Ambulatory Visit
Admission: RE | Admit: 2018-11-30 | Discharge: 2018-11-30 | Disposition: A | Discharge status: Home / Self Care | Payer: Medicare HMO | Source: Ambulatory Visit | Attending: Family | Admitting: Family

## 2018-11-30 ENCOUNTER — Other Ambulatory Visit: Payer: Self-pay | Admitting: Family Medicine

## 2018-11-30 DIAGNOSIS — M25572 Pain in left ankle and joints of left foot: Secondary | ICD-10-CM

## 2018-11-30 DIAGNOSIS — M25571 Pain in right ankle and joints of right foot: Secondary | ICD-10-CM

## 2019-01-11 ENCOUNTER — Ambulatory Visit (HOSPITAL_COMMUNITY): Payer: Medicare HMO | Attending: Internal Medicine | Admitting: Physical Therapy

## 2019-01-11 ENCOUNTER — Encounter (HOSPITAL_COMMUNITY): Payer: Self-pay | Admitting: Physical Therapy

## 2019-01-11 ENCOUNTER — Telehealth (HOSPITAL_COMMUNITY): Payer: Self-pay | Admitting: Physical Therapy

## 2019-01-11 ENCOUNTER — Other Ambulatory Visit: Payer: Self-pay

## 2019-01-11 DIAGNOSIS — R262 Difficulty in walking, not elsewhere classified: Secondary | ICD-10-CM | POA: Diagnosis present

## 2019-01-11 DIAGNOSIS — M25572 Pain in left ankle and joints of left foot: Secondary | ICD-10-CM | POA: Diagnosis present

## 2019-01-11 DIAGNOSIS — M25571 Pain in right ankle and joints of right foot: Secondary | ICD-10-CM

## 2019-01-11 DIAGNOSIS — I890 Lymphedema, not elsewhere classified: Secondary | ICD-10-CM
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
## 2019-01-11 NOTE — Telephone Encounter (Signed)
Wife cannot get anyone to bring him and they will start on Tuesady. 01/18/2019

## 2019-01-12 ENCOUNTER — Encounter (HOSPITAL_COMMUNITY): Payer: Medicare HMO

## 2019-01-12 ENCOUNTER — Ambulatory Visit (HOSPITAL_COMMUNITY): Payer: Medicare HMO | Admitting: Physical Therapy

## 2019-01-12 ENCOUNTER — Encounter (HOSPITAL_COMMUNITY): Payer: Medicare HMO | Admitting: Physical Therapy

## 2019-01-13 ENCOUNTER — Ambulatory Visit (HOSPITAL_COMMUNITY): Payer: Medicare HMO | Admitting: Physical Therapy

## 2019-01-13 ENCOUNTER — Encounter (HOSPITAL_COMMUNITY): Payer: Self-pay

## 2019-01-14 ENCOUNTER — Encounter (HOSPITAL_COMMUNITY): Payer: Medicare HMO | Admitting: Physical Therapy

## 2019-01-17 ENCOUNTER — Telehealth (HOSPITAL_COMMUNITY): Payer: Self-pay | Admitting: Physical Therapy

## 2019-01-17 ENCOUNTER — Ambulatory Visit (HOSPITAL_COMMUNITY): Payer: Medicare HMO | Admitting: Physical Therapy

## 2019-01-17 NOTE — Telephone Encounter (Signed)
Patient (Daughter was exposed to covid and the family is quarantining until the 14 days are up. They plan to be back on 01/25/2019.)

## 2019-01-18 ENCOUNTER — Ambulatory Visit (HOSPITAL_COMMUNITY): Payer: Medicare HMO | Admitting: Physical Therapy

## 2019-01-19 ENCOUNTER — Ambulatory Visit (HOSPITAL_COMMUNITY): Payer: Medicare HMO | Admitting: Physical Therapy

## 2019-01-21 ENCOUNTER — Encounter (HOSPITAL_COMMUNITY): Payer: Medicare HMO | Admitting: Physical Therapy

## 2019-01-24 ENCOUNTER — Encounter (HOSPITAL_COMMUNITY): Payer: Medicare HMO | Admitting: Physical Therapy

## 2019-01-25 ENCOUNTER — Ambulatory Visit (HOSPITAL_COMMUNITY): Payer: Medicare HMO | Admitting: Physical Therapy

## 2019-01-25 ENCOUNTER — Telehealth: Payer: Self-pay | Admitting: Student

## 2019-01-25 ENCOUNTER — Other Ambulatory Visit: Payer: Self-pay

## 2019-01-25 DIAGNOSIS — R262 Difficulty in walking, not elsewhere classified: Secondary | ICD-10-CM

## 2019-01-25 DIAGNOSIS — I890 Lymphedema, not elsewhere classified: Secondary | ICD-10-CM

## 2019-01-25 DIAGNOSIS — M25571 Pain in right ankle and joints of right foot: Secondary | ICD-10-CM

## 2019-01-25 DIAGNOSIS — M25572 Pain in left ankle and joints of left foot: Secondary | ICD-10-CM

## 2019-01-25 NOTE — Telephone Encounter (Signed)
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
## 2019-01-26 ENCOUNTER — Ambulatory Visit (HOSPITAL_COMMUNITY): Payer: Medicare HMO | Admitting: Physical Therapy

## 2019-01-26 ENCOUNTER — Encounter: Payer: Self-pay | Admitting: Student

## 2019-01-26 ENCOUNTER — Telehealth (INDEPENDENT_AMBULATORY_CARE_PROVIDER_SITE_OTHER): Payer: Medicare HMO | Admitting: Student

## 2019-01-26 VITALS — Ht 71.0 in | Wt >= 6400 oz

## 2019-01-26 DIAGNOSIS — I5022 Chronic systolic (congestive) heart failure: Secondary | ICD-10-CM | POA: Diagnosis not present

## 2019-01-26 DIAGNOSIS — I10 Essential (primary) hypertension: Secondary | ICD-10-CM

## 2019-01-26 DIAGNOSIS — Z79899 Other long term (current) drug therapy: Secondary | ICD-10-CM

## 2019-01-26 DIAGNOSIS — I890 Lymphedema, not elsewhere classified: Secondary | ICD-10-CM

## 2019-01-26 MED ORDER — SPIRONOLACTONE 25 MG PO TABS: 12.5000 mg | ORAL_TABLET | Freq: Every day | ORAL | 3 refills | Status: DC
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
## 2019-01-26 NOTE — Patient Instructions (Signed)
Medication Instructions:  Your physician has recommended you make the following change in your medication:  Start Spironolactone 12.5 mg Daily   *If you need a refill on your cardiac medications before your next appointment, please call your pharmacy*  Lab Work: Your physician has recommended you make the following change in your medication: 2 Weeks   If you have labs (blood work) drawn today and your tests are completely normal, you will receive your results only by: Marland Kitchen MyChart Message (if you have MyChart) OR . A paper copy in the mail If you have any lab test that is abnormal or we need to change your treatment, we will call you to review the results.  Testing/Procedures: NONE   Follow-Up: At [facility], you and your health needs are our priority.  As part of our continuing mission to provide you with exceptional heart care, we have created designated Provider Care Teams.  These Care Teams include your primary Cardiologist (physician) and Advanced Practice Providers (APPs -  Physician Assistants and Nurse Practitioners) who all work together to provide you with the care you need, when you need it.  Your next appointment:   3 month(s)  The format for your next appointment:   In Person  Provider:   Prentice Docker, MD  Other Instructions Thank you for choosing [facility]!

## 2019-01-27 ENCOUNTER — Telehealth (HOSPITAL_COMMUNITY): Payer: Self-pay | Admitting: Physical Therapy

## 2019-01-27 NOTE — Telephone Encounter (Signed)
pt had to cancel for tomorrow be cause he did not get his wraps in time

## 2019-01-28 ENCOUNTER — Ambulatory Visit (HOSPITAL_COMMUNITY): Payer: Medicare HMO | Admitting: Physical Therapy

## 2019-01-28 ENCOUNTER — Encounter (HOSPITAL_COMMUNITY): Payer: Self-pay | Admitting: Physical Therapy

## 2019-01-28 NOTE — Addendum Note (Signed)
Addended by: Bella Kennedy on: 01/28/2019 04:19 PM   Modules accepted: Orders

## 2019-01-31 ENCOUNTER — Telehealth (HOSPITAL_COMMUNITY): Payer: Self-pay | Admitting: Physical Therapy

## 2019-01-31 ENCOUNTER — Ambulatory Visit (HOSPITAL_COMMUNITY): Payer: Medicare HMO | Admitting: Physical Therapy

## 2019-01-31 ENCOUNTER — Encounter (HOSPITAL_COMMUNITY): Payer: Medicare HMO | Admitting: Physical Therapy

## 2019-01-31 NOTE — Telephone Encounter (Signed)
He woke up with upset tommy and will not be here today- he states he has not been exsposed to covid-19

## 2019-02-02 ENCOUNTER — Encounter (HOSPITAL_COMMUNITY): Payer: Medicare HMO | Admitting: Physical Therapy

## 2019-02-02 ENCOUNTER — Ambulatory Visit (HOSPITAL_COMMUNITY): Payer: Medicare HMO | Admitting: Physical Therapy

## 2019-02-04 ENCOUNTER — Encounter (HOSPITAL_COMMUNITY): Payer: Self-pay | Admitting: Physical Therapy

## 2019-02-04 ENCOUNTER — Telehealth (HOSPITAL_COMMUNITY): Payer: Self-pay | Admitting: Physical Therapy

## 2019-02-04 ENCOUNTER — Ambulatory Visit (HOSPITAL_COMMUNITY): Payer: Medicare HMO | Admitting: Physical Therapy
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
## 2019-02-07 ENCOUNTER — Encounter (HOSPITAL_COMMUNITY): Payer: Medicare HMO | Admitting: Physical Therapy

## 2019-02-08 ENCOUNTER — Ambulatory Visit (HOSPITAL_COMMUNITY): Payer: Medicare HMO | Attending: Internal Medicine | Admitting: Physical Therapy

## 2019-02-08 ENCOUNTER — Other Ambulatory Visit: Payer: Self-pay

## 2019-02-08 ENCOUNTER — Encounter (HOSPITAL_COMMUNITY): Payer: Medicare HMO | Admitting: Physical Therapy

## 2019-02-08 DIAGNOSIS — M25571 Pain in right ankle and joints of right foot: Secondary | ICD-10-CM

## 2019-02-08 DIAGNOSIS — M25572 Pain in left ankle and joints of left foot: Secondary | ICD-10-CM

## 2019-02-08 DIAGNOSIS — I890 Lymphedema, not elsewhere classified: Secondary | ICD-10-CM | POA: Insufficient documentation

## 2019-02-08 DIAGNOSIS — R262 Difficulty in walking, not elsewhere classified: Secondary | ICD-10-CM | POA: Diagnosis present
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
## 2019-02-09 ENCOUNTER — Ambulatory Visit (HOSPITAL_COMMUNITY): Payer: Medicare HMO | Admitting: Physical Therapy

## 2019-02-09 ENCOUNTER — Encounter (HOSPITAL_COMMUNITY): Payer: Medicare HMO | Admitting: Physical Therapy

## 2019-02-09 ENCOUNTER — Telehealth (HOSPITAL_COMMUNITY): Payer: Self-pay | Admitting: Physical Therapy

## 2019-02-09 ENCOUNTER — Encounter (HOSPITAL_COMMUNITY): Payer: Self-pay

## 2019-02-09 NOTE — Telephone Encounter (Signed)
pt wife had to work and he did not have a way to get to his appt

## 2019-02-11 ENCOUNTER — Encounter (HOSPITAL_COMMUNITY): Payer: Self-pay

## 2019-02-11 ENCOUNTER — Telehealth (HOSPITAL_COMMUNITY): Payer: Self-pay | Admitting: Physical Therapy

## 2019-02-11 ENCOUNTER — Ambulatory Visit (HOSPITAL_COMMUNITY): Payer: Medicare HMO | Admitting: Physical Therapy

## 2019-02-11 NOTE — Telephone Encounter (Signed)
pt cancelled appt today because his daughter got called in to work and he does not have transportation to come

## 2019-02-14 ENCOUNTER — Encounter (HOSPITAL_COMMUNITY): Payer: Medicare HMO | Admitting: Physical Therapy

## 2019-02-15 ENCOUNTER — Encounter (HOSPITAL_COMMUNITY): Payer: Medicare HMO | Admitting: Physical Therapy

## 2019-02-15 ENCOUNTER — Ambulatory Visit (HOSPITAL_COMMUNITY): Payer: Medicare HMO | Admitting: Physical Therapy

## 2019-02-15 ENCOUNTER — Telehealth (HOSPITAL_COMMUNITY): Payer: Self-pay | Admitting: Physical Therapy

## 2019-02-15 NOTE — Telephone Encounter (Signed)
pt cancelled appt today because he has a bad cough

## 2019-02-16 ENCOUNTER — Telehealth (HOSPITAL_COMMUNITY): Payer: Self-pay | Admitting: Physical Therapy

## 2019-02-16 ENCOUNTER — Encounter (HOSPITAL_COMMUNITY): Payer: Medicare HMO | Admitting: Physical Therapy

## 2019-02-16 ENCOUNTER — Ambulatory Visit (HOSPITAL_COMMUNITY): Payer: Medicare HMO | Admitting: Physical Therapy

## 2019-02-16 NOTE — Telephone Encounter (Signed)
CR said she called him and cx today and Friday

## 2019-02-16 NOTE — Telephone Encounter (Signed)
CR cx this apptment and she will tell pt today when they arrive.

## 2019-02-18 ENCOUNTER — Ambulatory Visit (HOSPITAL_COMMUNITY): Payer: Medicare HMO | Admitting: Physical Therapy

## 2019-02-21 ENCOUNTER — Encounter (HOSPITAL_COMMUNITY): Payer: Medicare HMO | Admitting: Physical Therapy

## 2019-02-22 ENCOUNTER — Telehealth (HOSPITAL_COMMUNITY): Payer: Self-pay | Admitting: Physical Therapy

## 2019-02-22 ENCOUNTER — Ambulatory Visit (HOSPITAL_COMMUNITY): Payer: Medicare HMO | Admitting: Physical Therapy

## 2019-02-22 ENCOUNTER — Encounter (HOSPITAL_COMMUNITY): Payer: Medicare HMO | Admitting: Physical Therapy
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
## 2019-02-23 ENCOUNTER — Telehealth (HOSPITAL_COMMUNITY): Payer: Self-pay | Admitting: Physical Therapy

## 2019-02-23 ENCOUNTER — Encounter (HOSPITAL_COMMUNITY): Payer: Self-pay | Admitting: Physical Therapy

## 2019-02-23 ENCOUNTER — Encounter (HOSPITAL_COMMUNITY): Payer: Medicare HMO | Admitting: Physical Therapy

## 2019-02-23 ENCOUNTER — Ambulatory Visit (HOSPITAL_COMMUNITY): Payer: Medicare HMO | Admitting: Physical Therapy

## 2019-02-23 NOTE — Telephone Encounter (Signed)
pt has cancelled appt because he fell and does not have any one else to help him

## 2019-02-23 NOTE — Telephone Encounter (Signed)
Called pt to see if he would be interested in home health therapy.  PT feels that this would be better for him at this time.  Pt referring provider contacted; we will discharge at this time.   Virgina Organ, PT CLT [phone]

## 2019-02-23 NOTE — Therapy (Unsigned)
[facility] [place], [place], [address] Phone: [phone]   Fax:  [phone]  Patient Details  Name: Jordan Jensen MRN: [id number] Date of Birth: 06-01-1970 Referring Provider:  No ref. provider found  Encounter Date: 02/23/2019 PHYSICAL THERAPY DISCHARGE SUMMARY  Visits from Start of Care: 3 including eval   Current functional level related to goals / functional outcomes: Has not been seen in a month   Remaining deficits: unknown   Education / Equipment: Exercises  Plan: Patient agrees to discharge.  Patient goals were not met. Patient is being discharged due to not returning since the last visit.  ?????    PT has fallen several times at home and is finding it difficult to get into treatment.  Recommended home health to pt's provider Rayetta Humphrey, PT CLT [phone] 02/23/2019, 2:56 PM  [place] [address] [place], [place], [address] Phone: [phone]   Fax:  [phone]

## 2019-02-23 NOTE — Telephone Encounter (Signed)
CR requested to call pt and cx Friday's apptment. S/w pt and he was ok with cx'ling this Friday 02/25/2019

## 2019-02-25 ENCOUNTER — Ambulatory Visit (HOSPITAL_COMMUNITY): Payer: Medicare HMO | Admitting: Physical Therapy

## 2019-02-28 ENCOUNTER — Encounter (HOSPITAL_COMMUNITY): Payer: Medicare HMO | Admitting: Physical Therapy

## 2019-03-01 ENCOUNTER — Encounter (HOSPITAL_COMMUNITY): Payer: Medicare HMO | Admitting: Physical Therapy

## 2019-03-01 ENCOUNTER — Ambulatory Visit (HOSPITAL_COMMUNITY): Payer: Medicare HMO | Admitting: Physical Therapy

## 2019-03-02 ENCOUNTER — Ambulatory Visit (HOSPITAL_COMMUNITY): Payer: Medicare HMO | Admitting: Physical Therapy

## 2019-03-02 ENCOUNTER — Encounter (HOSPITAL_COMMUNITY): Payer: Medicare HMO | Admitting: Physical Therapy

## 2019-03-04 ENCOUNTER — Encounter (HOSPITAL_COMMUNITY): Payer: Medicare HMO | Admitting: Physical Therapy

## 2019-03-07 ENCOUNTER — Encounter (HOSPITAL_COMMUNITY): Payer: Medicare HMO | Admitting: Physical Therapy

## 2019-03-08 ENCOUNTER — Encounter (HOSPITAL_COMMUNITY): Payer: Medicare HMO | Admitting: Physical Therapy

## 2019-03-10 ENCOUNTER — Encounter (HOSPITAL_COMMUNITY): Payer: Medicare HMO | Admitting: Physical Therapy

## 2019-03-15 ENCOUNTER — Encounter (HOSPITAL_COMMUNITY): Payer: Medicare HMO | Admitting: Physical Therapy

## 2019-03-16 ENCOUNTER — Encounter (HOSPITAL_COMMUNITY): Payer: Medicare HMO | Admitting: Physical Therapy

## 2019-03-17 ENCOUNTER — Encounter (HOSPITAL_COMMUNITY): Payer: Medicare HMO | Admitting: Physical Therapy

## 2019-03-21 ENCOUNTER — Encounter (HOSPITAL_COMMUNITY): Payer: Medicare HMO | Admitting: Physical Therapy

## 2019-03-23 ENCOUNTER — Encounter (HOSPITAL_COMMUNITY): Payer: Medicare HMO | Admitting: Physical Therapy

## 2019-03-25 ENCOUNTER — Encounter (HOSPITAL_COMMUNITY): Payer: Medicare HMO | Admitting: Physical Therapy

## 2019-03-28 ENCOUNTER — Encounter (HOSPITAL_COMMUNITY): Payer: Medicare HMO | Admitting: Physical Therapy

## 2019-03-30 ENCOUNTER — Encounter (HOSPITAL_COMMUNITY): Payer: Medicare HMO | Admitting: Physical Therapy

## 2019-04-01 ENCOUNTER — Encounter (HOSPITAL_COMMUNITY): Payer: Medicare HMO | Admitting: Physical Therapy

## 2019-04-10 IMAGING — DX DG CHEST 2V
2 series · 2 of 2 positions shown · non-contrast
Comparison: 03/26/2017 chest radiograph.

CLINICAL DATA: 47 y/o  M; cough and shortness of breath.

EXAM:
CHEST - 2 VIEW

[w chest lat]
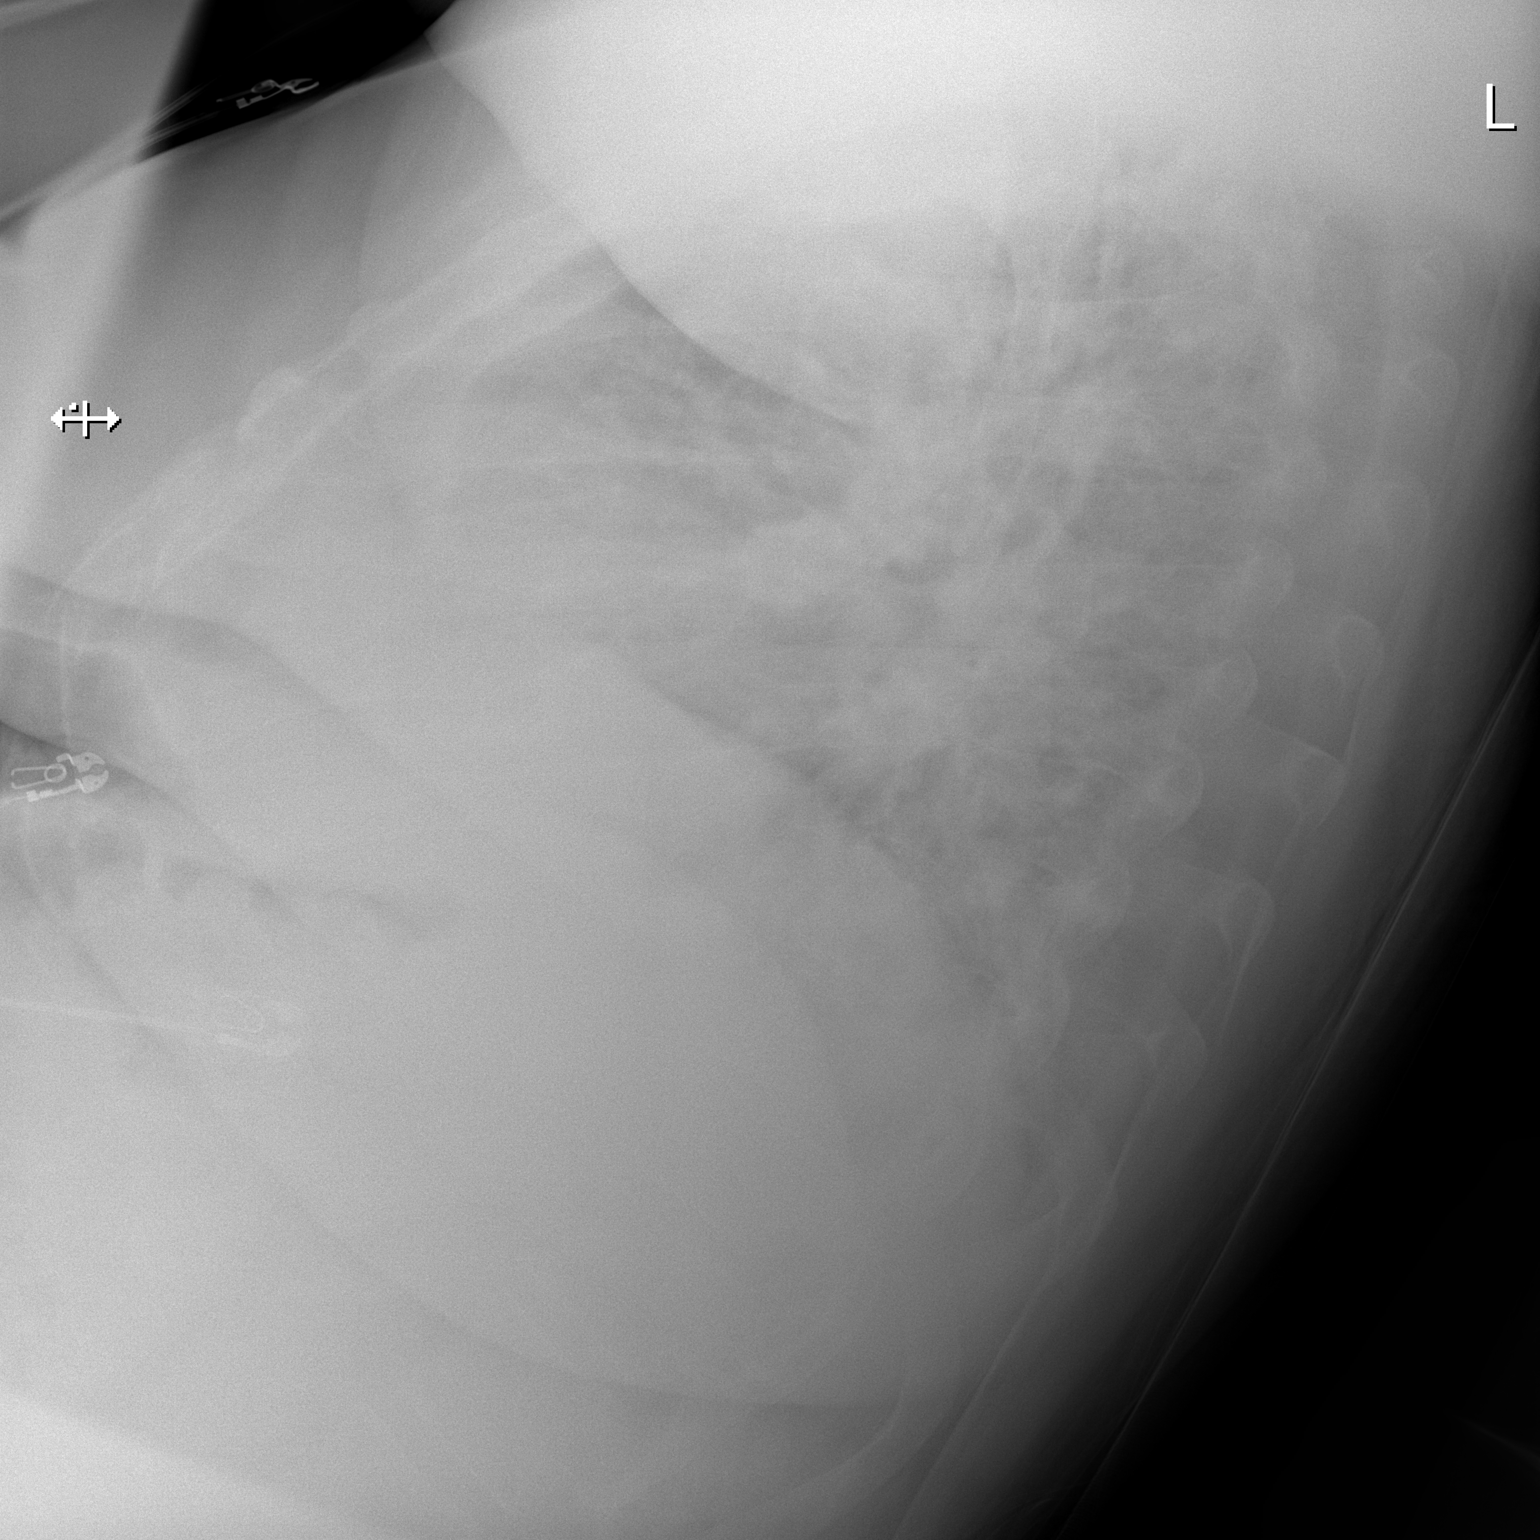

[x chest ap]
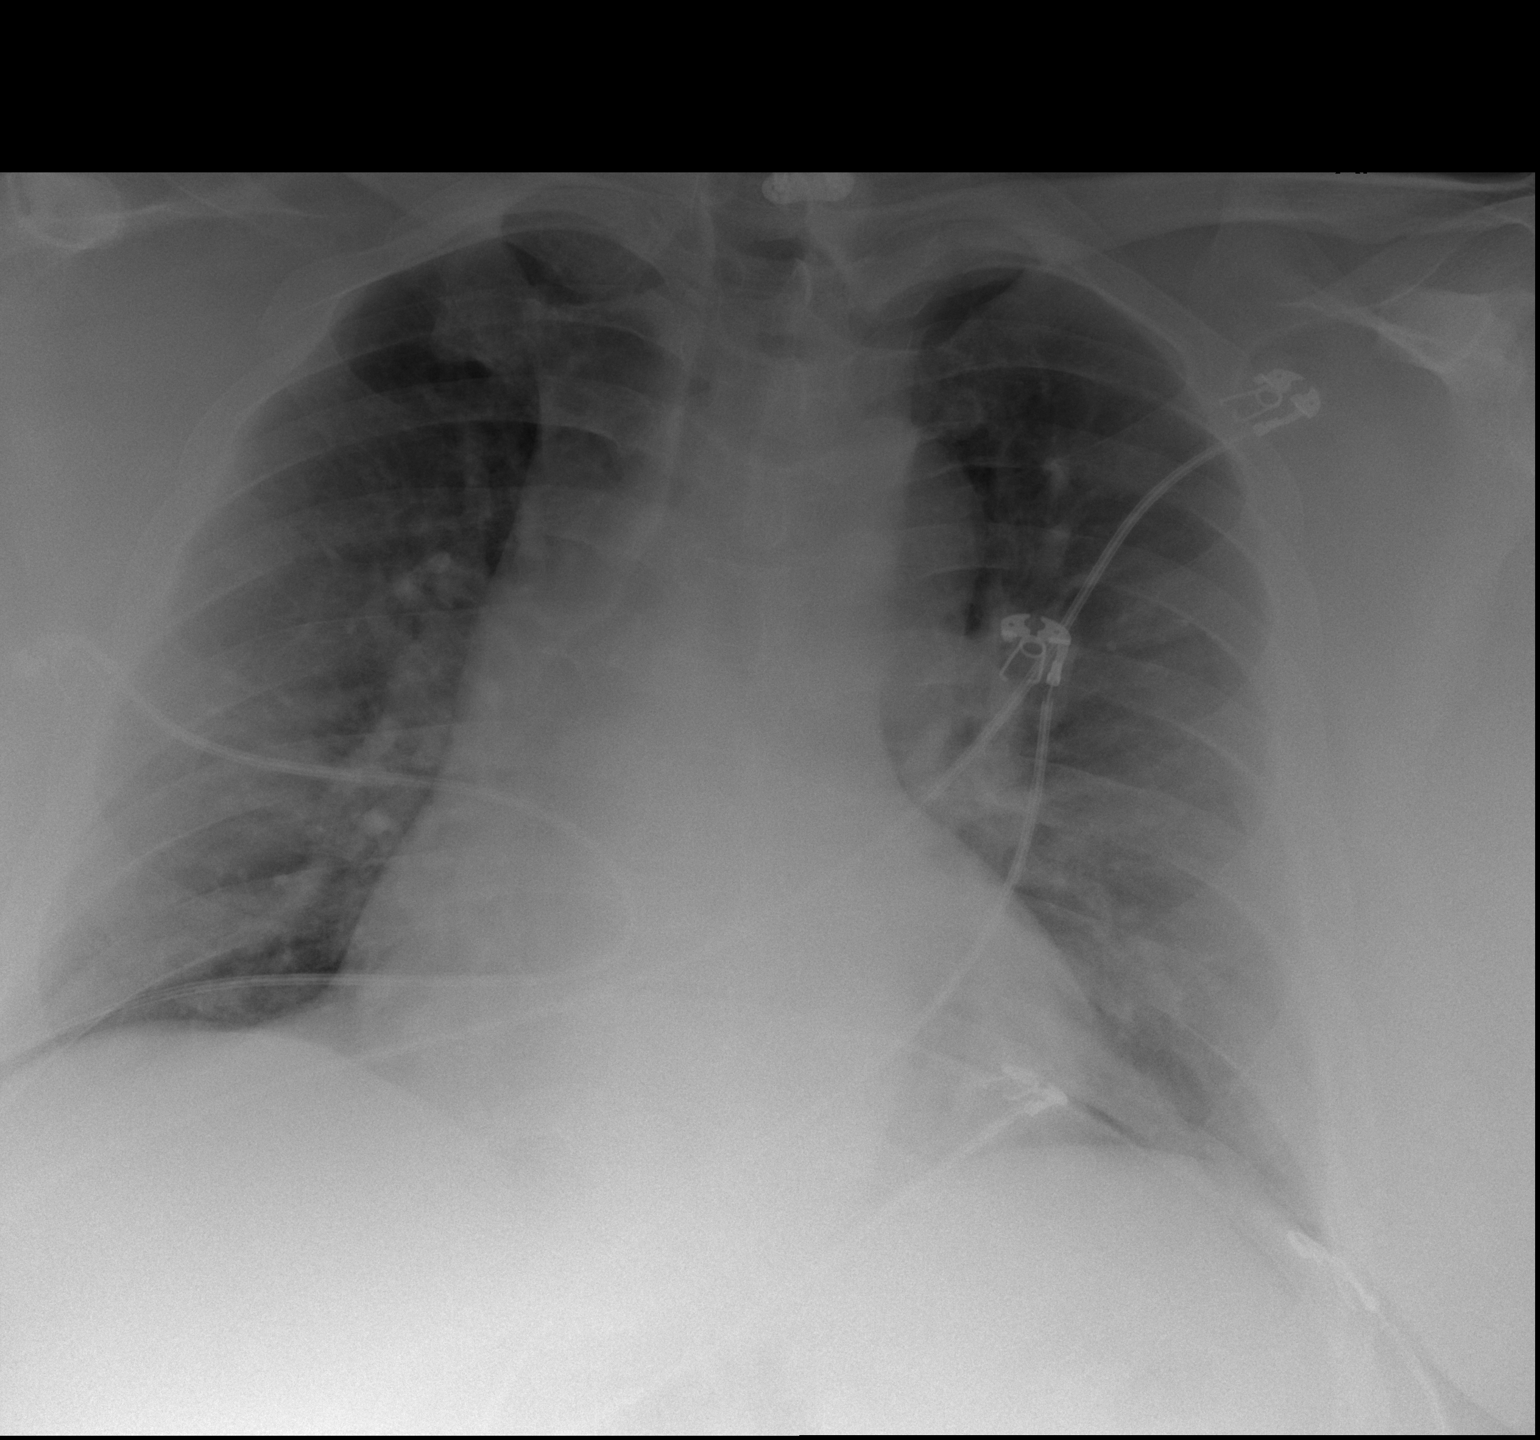

[2 of 2 positions shown; findings below may reference images not displayed]

FINDINGS: Stable cardiac silhouette within normal limits. Increased pulmonary
markings. No consolidation. No pleural effusion or pneumothorax.
Bones are unremarkable.
IMPRESSION: Increased pulmonary markings, possibly bronchitic changes or
atypical pneumonia. No consolidation.

## 2019-04-22 ENCOUNTER — Telehealth: Payer: Self-pay

## 2019-04-22 ENCOUNTER — Ambulatory Visit: Payer: Medicare HMO | Admitting: Cardiovascular Disease

## 2019-04-22 NOTE — Telephone Encounter (Signed)
  Patient Consent for Virtual Visit         Jordan Jensen has provided verbal consent on 04/22/2019 for a virtual visit (video or telephone).   CONSENT FOR VIRTUAL VISIT FOR:  Jordan Jensen  By participating in this virtual visit I agree to the following:  I hereby voluntarily request, consent and authorize [facility] and its employed or contracted physicians, physician assistants, nurse practitioners or other licensed health care professionals (the Practitioner), to provide me with telemedicine health care services (the "Services") as deemed necessary by the treating Practitioner. I acknowledge and consent to receive the Services by the Practitioner via telemedicine. I understand that the telemedicine visit will involve communicating with the Practitioner through live audiovisual communication technology and the disclosure of certain medical information by electronic transmission. I acknowledge that I have been given the opportunity to request an in-person assessment or other available alternative prior to the telemedicine visit and am voluntarily participating in the telemedicine visit.  I understand that I have the right to withhold or withdraw my consent to the use of telemedicine in the course of my care at any time, without affecting my right to future care or treatment, and that the Practitioner or I may terminate the telemedicine visit at any time. I understand that I have the right to inspect all information obtained and/or recorded in the course of the telemedicine visit and may receive copies of available information for a reasonable fee.  I understand that some of the potential risks of receiving the Services via telemedicine include:  Marland Kitchen Delay or interruption in medical evaluation due to technological equipment failure or disruption; . Information transmitted may not be sufficient (e.g. poor resolution of images) to allow for appropriate medical decision making by the Practitioner;  and/or  . In rare instances, security protocols could fail, causing a breach of personal health information.  Furthermore, I acknowledge that it is my responsibility to provide information about my medical history, conditions and care that is complete and accurate to the best of my ability. I acknowledge that Practitioner's advice, recommendations, and/or decision may be based on factors not within their control, such as incomplete or inaccurate data provided by me or distortions of diagnostic images or specimens that may result from electronic transmissions. I understand that the practice of medicine is not an exact science and that Practitioner makes no warranties or guarantees regarding treatment outcomes. I acknowledge that a copy of this consent can be made available to me via my patient portal [facility] MyChart), or I can request a printed copy by calling the office of [facility].    I understand that my insurance will be billed for this visit.   I have read or had this consent read to me. . I understand the contents of this consent, which adequately explains the benefits and risks of the Services being provided via telemedicine.  . I have been provided ample opportunity to ask questions regarding this consent and the Services and have had my questions answered to my satisfaction. . I give my informed consent for the services to be provided through the use of telemedicine in my medical care

## 2019-04-26 ENCOUNTER — Encounter: Payer: Self-pay | Admitting: Cardiovascular Disease

## 2019-04-26 ENCOUNTER — Telehealth (INDEPENDENT_AMBULATORY_CARE_PROVIDER_SITE_OTHER): Payer: Medicare HMO | Admitting: Cardiovascular Disease

## 2019-04-26 VITALS — Ht 71.0 in | Wt >= 6400 oz

## 2019-04-26 DIAGNOSIS — I5022 Chronic systolic (congestive) heart failure: Secondary | ICD-10-CM | POA: Diagnosis not present

## 2019-04-26 DIAGNOSIS — I428 Other cardiomyopathies: Secondary | ICD-10-CM

## 2019-04-26 DIAGNOSIS — I110 Hypertensive heart disease with heart failure: Secondary | ICD-10-CM | POA: Diagnosis not present

## 2019-04-26 DIAGNOSIS — I890 Lymphedema, not elsewhere classified: Secondary | ICD-10-CM | POA: Diagnosis not present

## 2019-04-26 DIAGNOSIS — I10 Essential (primary) hypertension: Secondary | ICD-10-CM
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
## 2019-04-26 NOTE — Addendum Note (Signed)
Addended by: Marlyn Corporal A on: 04/26/2019 12:33 PM   Modules accepted: Orders

## 2019-04-26 NOTE — Patient Instructions (Signed)
Medication Instructions:  Your physician recommends that you continue on your current medications as directed. Please refer to the Current Medication list given to you today.  *If you need a refill on your cardiac medications before your next appointment, please call your pharmacy*   Lab Work: None  If you have labs (blood work) drawn today and your tests are completely normal, you will receive your results only by: Marland Kitchen MyChart Message (if you have MyChart) OR . A paper copy in the mail If you have any lab test that is abnormal or we need to change your treatment, we will call you to review the results.   Testing/Procedures: None    Follow-Up: At [facility], you and your health needs are our priority.  As part of our continuing mission to provide you with exceptional heart care, we have created designated Provider Care Teams.  These Care Teams include your primary Cardiologist (physician) and Advanced Practice Providers (APPs -  Physician Assistants and Nurse Practitioners) who all work together to provide you with the care you need, when you need it.  We recommend signing up for the patient portal called "MyChart".  Sign up information is provided on this After Visit Summary.  MyChart is used to connect with patients for Virtual Visits (Telemedicine).  Patients are able to view lab/test results, encounter notes, upcoming appointments, etc.  Non-urgent messages can be sent to your provider as well.   To learn more about what you can do with MyChart, go to [email or web address].    Your next appointment:   6 month(s)  The format for your next appointment:   In Person  Provider:   Randall An, PA-C   Other Instructions None      Thank you for choosing [facility] !

## 2019-10-05 ENCOUNTER — Other Ambulatory Visit: Payer: Self-pay

## 2019-10-05 DIAGNOSIS — I824Y9 Acute embolism and thrombosis of unspecified deep veins of unspecified proximal lower extremity: Secondary | ICD-10-CM

## 2019-10-13 ENCOUNTER — Other Ambulatory Visit: Payer: Self-pay

## 2019-10-13 ENCOUNTER — Ambulatory Visit
Admission: EM | Admit: 2019-10-13 | Discharge: 2019-10-13 | Disposition: A | Discharge status: Home / Self Care | Payer: Medicare HMO | Attending: Emergency Medicine | Admitting: Emergency Medicine

## 2019-10-13 DIAGNOSIS — S31829A Unspecified open wound of left buttock, initial encounter: Secondary | ICD-10-CM | POA: Diagnosis not present

## 2019-10-13 MED ORDER — AMOXICILLIN-POT CLAVULANATE 875-125 MG PO TABS: 1.0000 | ORAL_TABLET | Freq: Two times a day (BID) | ORAL | 0 refills | Status: AC
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
## 2019-10-13 NOTE — Discharge Instructions (Signed)
Based on history and wound on bottom will cover for perirectal infection Wash site daily with warm water and mild soap Keep covered to avoid friction Use donut while sitting or lay on side to alleviate pressure on bottom Take antibiotic as prescribed and to completion Follow up here or with PCP for recheck in 1-2 weeks Return or go to the ED if you have any new or worsening symptoms increased redness, swelling, pain, nausea, vomiting, fever, chills, etc..Marland Kitchen

## 2019-10-21 ENCOUNTER — Encounter (HOSPITAL_COMMUNITY): Payer: Medicare HMO

## 2019-10-21 ENCOUNTER — Encounter: Payer: Medicare HMO | Admitting: Vascular Surgery

## 2019-10-26 ENCOUNTER — Ambulatory Visit (HOSPITAL_COMMUNITY): Payer: Medicare HMO

## 2019-11-14 ENCOUNTER — Other Ambulatory Visit: Payer: Self-pay | Admitting: Vascular Surgery

## 2019-11-14 ENCOUNTER — Ambulatory Visit (HOSPITAL_COMMUNITY)
Admission: RE | Admit: 2019-11-14 | Discharge: 2019-11-14 | Disposition: A | Discharge status: Home / Self Care | Payer: Medicare HMO | Source: Ambulatory Visit | Attending: Physician Assistant | Admitting: Physician Assistant

## 2019-11-14 ENCOUNTER — Ambulatory Visit (INDEPENDENT_AMBULATORY_CARE_PROVIDER_SITE_OTHER): Payer: Medicare HMO | Admitting: Physician Assistant

## 2019-11-14 ENCOUNTER — Other Ambulatory Visit: Payer: Self-pay

## 2019-11-14 VITALS — BP 131/72 | HR 83 | Temp 98.7°F | Resp 20 | Ht 71.0 in

## 2019-11-14 DIAGNOSIS — I890 Lymphedema, not elsewhere classified: Secondary | ICD-10-CM | POA: Diagnosis not present

## 2019-11-14 DIAGNOSIS — R600 Localized edema: Secondary | ICD-10-CM

## 2019-11-14 DIAGNOSIS — I872 Venous insufficiency (chronic) (peripheral): Secondary | ICD-10-CM

## 2019-11-14 DIAGNOSIS — I824Y9 Acute embolism and thrombosis of unspecified deep veins of unspecified proximal lower extremity: Secondary | ICD-10-CM
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
## 2019-11-25 ENCOUNTER — Ambulatory Visit: Payer: Medicare HMO | Admitting: General Practice
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
## 2019-12-08 ENCOUNTER — Ambulatory Visit: Payer: Medicare HMO | Admitting: General Practice

## 2019-12-13 ENCOUNTER — Telehealth: Payer: Medicare HMO | Admitting: Emergency Medicine

## 2019-12-13 ENCOUNTER — Ambulatory Visit
Admission: EM | Admit: 2019-12-13 | Discharge: 2019-12-13 | Disposition: A | Discharge status: Home / Self Care | Payer: Medicare HMO | Attending: Internal Medicine | Admitting: Internal Medicine

## 2019-12-13 DIAGNOSIS — R8290 Unspecified abnormal findings in urine: Secondary | ICD-10-CM

## 2019-12-13 LAB — POCT URINALYSIS DIP (MANUAL ENTRY)
Bilirubin, UA: NEGATIVE
Blood, UA: NEGATIVE
Glucose, UA: NEGATIVE mg/dL
Ketones, POC UA: NEGATIVE mg/dL
Leukocytes, UA: NEGATIVE
Nitrite, UA: NEGATIVE
Protein Ur, POC: NEGATIVE mg/dL
Spec Grav, UA: >=1.03 — AB (ref 1.010–1.025)
Urobilinogen, UA: 0.2 E.U./dL
pH, UA: 5.5 (ref 5.0–8.0)
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
## 2019-12-13 NOTE — ED Triage Notes (Signed)
Pt presents with  C/o cloudy and fouls smelling urine with lower back pain for past couple of days

## 2019-12-13 NOTE — Discharge Instructions (Addendum)
Please increase fluid intake Your urine is very concentrated and may be responsible for the symptoms you are feeling.  Urine analysis is negative for urine infection Follow-up with your primary care physician if you have persistent symptoms

## 2019-12-13 NOTE — Progress Notes (Signed)
I'm sorry, urinary complaints in males do not fall within the scope of practice for an e-visit.  Unfortunately, I cannot adequately assess and treat you through and e-visit.  You will need to be seen in-person.  I recommend one of the following locations.   NOTE: If you entered your credit card information for this eVisit, you will not be charged. You may see a "hold" on your card for the $35 but that hold will drop off and you will not have a charge processed.   If you are having a true medical emergency please call 911.      For an urgent face to face visit, [facility] has five urgent care centers for your convenience:     [facility] at [facility] Directions [phone] [address] [place], [place] [address] . 10 am - 6pm Monday - Friday    [facility]) Get Driving Directions [phone] [address] [place], [place] [address] . 10 am to 8 pm Monday-Friday . 12 pm to 8 pm [facility] Urgent Care at [facility] Get Driving Directions [phone] [address] [place], [place] [address] . 8 am to 8 pm Monday-Friday . 9 am to 6 pm Saturday . 11 am to 6 pm Sunday     [facility] at [facility] Get Driving Directions  [phone] [address] [place], [place] [address] . 8 am to 8 pm Monday-Friday . 8 am to 4 pm [facility] Urgent Care at [facility] Directions [phone] [address] [place], [place] [address] . 12 pm to 6 pm Monday-Friday      Your e-visit answers were reviewed by a board certified advanced clinical practitioner to complete your personal care plan.  Thank you for using e-Visits.    Approximately 5 minutes was used in reviewing the patient's chart, questionnaire, prescribing medications, and documentation.

## 2019-12-14 ENCOUNTER — Ambulatory Visit
Admission: RE | Admit: 2019-12-14 | Discharge: 2019-12-14 | Disposition: A | Discharge status: Home / Self Care | Payer: Medicare HMO | Source: Ambulatory Visit | Attending: Emergency Medicine | Admitting: Emergency Medicine

## 2019-12-14 VITALS — BP 141/51 | HR 50 | Temp 98.0°F | Resp 18

## 2019-12-14 DIAGNOSIS — S31809A Unspecified open wound of unspecified buttock, initial encounter: Secondary | ICD-10-CM | POA: Diagnosis not present

## 2019-12-14 MED ORDER — DOXYCYCLINE HYCLATE 100 MG PO CAPS: 100.0000 mg | ORAL_CAPSULE | Freq: Two times a day (BID) | ORAL | 0 refills | Status: DC

## 2019-12-14 NOTE — Discharge Instructions (Signed)
  Keep the area clean with warm water and mild soap Doxycycline was prescribed take as directed Follow-up with PCP for referral to wound care Return or go to ED if you develop any new or worsening of your symptoms

## 2019-12-14 NOTE — ED Triage Notes (Signed)
Pt accompanied by CG stating that fistula site was bleeding, advised by pcp to come here for evaluation
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
## 2019-12-15 LAB — URINE CULTURE: Culture: NO GROWTH
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
## 2020-01-13 ENCOUNTER — Other Ambulatory Visit: Payer: Self-pay

## 2020-01-13 ENCOUNTER — Encounter: Payer: Self-pay | Admitting: Student

## 2020-01-13 ENCOUNTER — Telehealth (INDEPENDENT_AMBULATORY_CARE_PROVIDER_SITE_OTHER): Payer: Medicare HMO | Admitting: Student

## 2020-01-13 VITALS — Ht 71.0 in | Wt >= 6400 oz

## 2020-01-13 DIAGNOSIS — I5022 Chronic systolic (congestive) heart failure: Secondary | ICD-10-CM | POA: Diagnosis not present

## 2020-01-13 DIAGNOSIS — I10 Essential (primary) hypertension: Secondary | ICD-10-CM | POA: Diagnosis not present

## 2020-01-13 DIAGNOSIS — I890 Lymphedema, not elsewhere classified: Secondary | ICD-10-CM

## 2020-01-13 NOTE — Patient Instructions (Signed)
Medication Instructions:  Medication changes TBD *If you need a refill on your cardiac medications before your next appointment, please call your pharmacy*   Lab Work: None Today If you have labs (blood work) drawn today and your tests are completely normal, you will receive your results only by: Marland Kitchen MyChart Message (if you have MyChart) OR . A paper copy in the mail If you have any lab test that is abnormal or we need to change your treatment, we will call you to review the results.   Testing/Procedures: Your physician has requested that you have an echocardiogram. Echocardiography is a painless test that uses sound waves to create images of your heart. It provides your doctor with information about the size and shape of your heart and how well your heart's chambers and valves are working. This procedure takes approximately one hour. There are no restrictions for this procedure.     Follow-Up: At [facility], you and your health needs are our priority.  As part of our continuing mission to provide you with exceptional heart care, we have created designated Provider Care Teams.  These Care Teams include your primary Cardiologist (physician) and Advanced Practice Providers (APPs -  Physician Assistants and Nurse Practitioners) who all work together to provide you with the care you need, when you need it.  We recommend signing up for the patient portal called "MyChart".  Sign up information is provided on this After Visit Summary.  MyChart is used to connect with patients for Virtual Visits (Telemedicine).  Patients are able to view lab/test results, encounter notes, upcoming appointments, etc.  Non-urgent messages can be sent to your provider as well.   To learn more about what you can do with MyChart, go to [email or web address].    Your next appointment:   6 month(s)  The format for your next appointment:   In Person  Provider:   You may establish with a MD or one of the following  Advanced Practice Providers on your designated Care Team:    Randall An, PA-C    Other Instructions None Today

## 2020-01-27 ENCOUNTER — Ambulatory Visit (HOSPITAL_COMMUNITY): Payer: Medicare HMO

## 2020-02-14 ENCOUNTER — Ambulatory Visit (HOSPITAL_COMMUNITY): Admission: RE | Admit: 2020-02-14 | Payer: Medicare HMO | Source: Ambulatory Visit

## 2020-02-29 ENCOUNTER — Telehealth: Payer: Self-pay

## 2020-02-29 NOTE — Telephone Encounter (Signed)
Patient calls nurse line requesting help filling out disability paperwork. Patient reports he is a patient here, however has also been seeing an NP more recently. Patient advised to contact their office since it has been well over a year since he has been seen here. Patient reports the NP told him to contact us to fill forms out, as we have handled disability forms in the past for him. Patient has been scheduled with PCP for March. Patient advised to have all paperwork with him.

## 2020-03-06 ENCOUNTER — Ambulatory Visit (HOSPITAL_COMMUNITY)
Admission: RE | Admit: 2020-03-06 | Discharge: 2020-03-06 | Disposition: A | Discharge status: Home / Self Care | Payer: Medicare HMO | Source: Ambulatory Visit | Attending: Student | Admitting: Student

## 2020-03-06 ENCOUNTER — Other Ambulatory Visit: Payer: Self-pay

## 2020-03-06 DIAGNOSIS — I5022 Chronic systolic (congestive) heart failure: Secondary | ICD-10-CM | POA: Diagnosis not present

## 2020-03-06 LAB — ECHOCARDIOGRAM COMPLETE
AR max vel: 1.57 cm2
AV Area VTI: 1.57 cm2
AV Area mean vel: 1.49 cm2
AV Mean grad: 3.9 mmHg
AV Peak grad: 8.1 mmHg
Ao pk vel: 1.43 m/s
Area-P 1/2: 3.36 cm2
S' Lateral: 5.28 cm

## 2020-03-06 MED ORDER — PERFLUTREN LIPID MICROSPHERE
1.0000 mL | INTRAVENOUS | Status: AC | PRN
  Administered 2020-03-06: 1 mL via INTRAVENOUS
  Filled 2020-03-06: qty 10

## 2020-03-06 NOTE — Progress Notes (Signed)
*  PRELIMINARY RESULTS* Echocardiogram 2D Echocardiogram with definity has been performed. Patient unable to move to bed, test performed in wheelchair.  Jordan Jensen 03/06/2020, 3:59 PM

## 2020-03-14 NOTE — Progress Notes (Signed)
    SUBJECTIVE:   CHIEF COMPLAINT / HPI: requests for disability paperwork   Jordan Jensen is a 50 y.o. male with hx of heart failure, morbid obesity, spinal cord injury and bilateral LE edema who presents today requesting assistance with his disability paper work.  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  Patient is requesting a signature for this paperwork.  Patient has no other concerns.  Congratulated patient on 40 pound weight loss.  PERTINENT  PMH / PSH:  HFrEF, 30-40%  Morbid obesity  Spinal cord injury C5-C7  LE Edema, bilateral   OBJECTIVE:   BP 117/83   Pulse 91   SpO2 95%   General: male appearing stated age in no acute distress Pulm: normal respiratory effort  Abdomen: Bowel sounds normal. Abdomen soft and non-tender. Extremities: BLE  Neuro: pt alert and oriented x4    ASSESSMENT/PLAN:   Disability of walking Patient with hx of C5-C7 spinal cord injury and HFrEF 30-40%. Ambulates with assistance of crutches and uses wheelchair to travel long distances.  Patient requests paperwork for financial aid confirmation of disability Form completed, copy to be scanned into patient's chart  Original returned to patient       Ronnald Ramp, MD [facility]

## 2020-03-14 NOTE — Patient Instructions (Addendum)
It was a pleasure to see you today!  Thank you for choosing [facility] for your primary care.  ABASS MISENER was seen for paperwork for financial aid.   Our plans for today were:  Congratulations on finishing your program and for your weight loss!   Please keep up the good work.   We will scan a copy of your paperwork for your chart.   To keep you healthy, please keep in mind the following health maintenance items that you are due for:   1. Colonoscopy  2. Hepatitis C screening  3. COVID and flu vaccine    You should return to our clinic as needed.   Best Wishes,   Dr. Neita Garnet

## 2020-03-15 ENCOUNTER — Encounter: Payer: Self-pay | Admitting: Family Medicine

## 2020-03-15 ENCOUNTER — Ambulatory Visit (INDEPENDENT_AMBULATORY_CARE_PROVIDER_SITE_OTHER): Payer: Medicare HMO | Admitting: Family Medicine

## 2020-03-15 ENCOUNTER — Other Ambulatory Visit: Payer: Self-pay

## 2020-03-15 DIAGNOSIS — G8220 Paraplegia, unspecified: Secondary | ICD-10-CM

## 2020-03-15 DIAGNOSIS — R262 Difficulty in walking, not elsewhere classified: Secondary | ICD-10-CM

## 2020-03-15 NOTE — Assessment & Plan Note (Addendum)
Patient with hx of C5-C7 spinal cord injury and HFrEF 30-40%. Ambulates with assistance of crutches and uses wheelchair to travel long distances.  Patient requests paperwork for financial aid confirmation of disability Form completed, copy to be scanned into patient's chart  Original returned to patient

## 2020-03-15 NOTE — Assessment & Plan Note (Signed)
>>  ASSESSMENT AND PLAN FOR DISABILITY OF WALKING WRITTEN ON 03/15/2020  9:48 AM BY Ronnald Ramp, MD  Patient with hx of C5-C7 spinal cord injury and HFrEF 30-40%. Ambulates with assistance of crutches and uses wheelchair to travel long distances.  Patient requests paperwork for financial aid confirmation of disability Form completed, copy to be scanned into patient's chart  Original returned to patient

## 2020-05-01 ENCOUNTER — Telehealth: Payer: Self-pay | Admitting: Family Medicine

## 2020-05-01 NOTE — Telephone Encounter (Signed)
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 He was given the wrong form. They have emailed him the new form, he is not able to drop it off. I had him send form to my email. It is printed and placed in Dr. Myrtie Neither box.   Patient would like the form to be completed and faxed to the financial aid department. [phone] at attn to "Fast Team"

## 2020-05-04 NOTE — Telephone Encounter (Signed)
Physician section of form completed and placed in To Be Faxed file in front office to number given by the patient.   Student's signature and student ID number were not available at this time.   Ronnald Ramp, MD  [facility], PGY-2

## 2020-05-22 ENCOUNTER — Emergency Department (HOSPITAL_COMMUNITY)
Admission: EM | Admit: 2020-05-22 | Discharge: 2020-05-22 | Disposition: A | Discharge status: Home / Self Care | Payer: Medicare HMO | Attending: Emergency Medicine | Admitting: Emergency Medicine

## 2020-05-22 ENCOUNTER — Other Ambulatory Visit: Payer: Self-pay

## 2020-05-22 ENCOUNTER — Encounter (HOSPITAL_COMMUNITY): Payer: Self-pay | Admitting: Emergency Medicine

## 2020-05-22 DIAGNOSIS — R109 Unspecified abdominal pain: Secondary | ICD-10-CM | POA: Diagnosis present

## 2020-05-22 DIAGNOSIS — Z5321 Procedure and treatment not carried out due to patient leaving prior to being seen by health care provider: Secondary | ICD-10-CM | POA: Diagnosis not present

## 2020-05-22 NOTE — ED Triage Notes (Signed)
Pt c/o bilateral flank pain x 1 week.

## 2020-06-03 ENCOUNTER — Emergency Department (HOSPITAL_COMMUNITY)
Admission: EM | Admit: 2020-06-03 | Discharge: 2020-06-03 | Disposition: A | Discharge status: Home / Self Care | Payer: Medicare HMO | Attending: Emergency Medicine | Admitting: Emergency Medicine

## 2020-06-03 ENCOUNTER — Encounter (HOSPITAL_COMMUNITY): Payer: Self-pay

## 2020-06-03 ENCOUNTER — Other Ambulatory Visit: Payer: Self-pay

## 2020-06-03 DIAGNOSIS — Z96649 Presence of unspecified artificial hip joint: Secondary | ICD-10-CM | POA: Diagnosis not present

## 2020-06-03 DIAGNOSIS — R2243 Localized swelling, mass and lump, lower limb, bilateral: Secondary | ICD-10-CM | POA: Diagnosis not present

## 2020-06-03 DIAGNOSIS — I110 Hypertensive heart disease with heart failure: Secondary | ICD-10-CM | POA: Diagnosis not present

## 2020-06-03 DIAGNOSIS — R109 Unspecified abdominal pain: Secondary | ICD-10-CM | POA: Diagnosis not present

## 2020-06-03 DIAGNOSIS — Z91040 Latex allergy status: Secondary | ICD-10-CM | POA: Insufficient documentation

## 2020-06-03 DIAGNOSIS — Z87891 Personal history of nicotine dependence: Secondary | ICD-10-CM | POA: Insufficient documentation

## 2020-06-03 DIAGNOSIS — R350 Frequency of micturition: Secondary | ICD-10-CM | POA: Insufficient documentation

## 2020-06-03 DIAGNOSIS — M545 Low back pain, unspecified: Secondary | ICD-10-CM | POA: Insufficient documentation

## 2020-06-03 DIAGNOSIS — I509 Heart failure, unspecified: Secondary | ICD-10-CM | POA: Insufficient documentation

## 2020-06-03 DIAGNOSIS — Z79899 Other long term (current) drug therapy: Secondary | ICD-10-CM | POA: Insufficient documentation

## 2020-06-03 DIAGNOSIS — G8929 Other chronic pain: Secondary | ICD-10-CM | POA: Diagnosis not present

## 2020-06-03 LAB — URINALYSIS, ROUTINE W REFLEX MICROSCOPIC
Bacteria, UA: NONE SEEN
Bilirubin Urine: NEGATIVE
Glucose, UA: NEGATIVE mg/dL
Ketones, ur: NEGATIVE mg/dL
Leukocytes,Ua: NEGATIVE
Nitrite: NEGATIVE
Protein, ur: NEGATIVE mg/dL
Specific Gravity, Urine: 1.014 (ref 1.005–1.030)
pH: 6 (ref 5.0–8.0)

## 2020-06-03 LAB — COMPREHENSIVE METABOLIC PANEL
ALT: 22 U/L (ref 0–44)
AST: 27 U/L (ref 15–41)
Albumin: 3.9 g/dL (ref 3.5–5.0)
Alkaline Phosphatase: 61 U/L (ref 38–126)
Anion gap: 8 (ref 5–15)
BUN: 10 mg/dL (ref 6–20)
CO2: 30 mmol/L (ref 22–32)
Calcium: 8.7 mg/dL — ABNORMAL LOW (ref 8.9–10.3)
Chloride: 97 mmol/L — ABNORMAL LOW (ref 98–111)
Creatinine, Ser: 0.99 mg/dL (ref 0.61–1.24)
GFR, Estimated: >60 mL/min (ref >60)
Glucose, Bld: 92 mg/dL (ref 70–99)
Potassium: 4 mmol/L (ref 3.5–5.1)
Sodium: 135 mmol/L (ref 135–145)
Total Bilirubin: 0.8 mg/dL (ref 0.3–1.2)
Total Protein: 7.8 g/dL (ref 6.5–8.1)

## 2020-06-03 LAB — CBC
HCT: 45.8 % (ref 39.0–52.0)
Hemoglobin: 14.5 g/dL (ref 13.0–17.0)
MCH: 29.5 pg (ref 26.0–34.0)
MCHC: 31.7 g/dL (ref 30.0–36.0)
MCV: 93.1 fL (ref 80.0–100.0)
Platelets: 160 10*3/uL (ref 150–400)
RBC: 4.92 MIL/uL (ref 4.22–5.81)
RDW: 12.9 % (ref 11.5–15.5)
WBC: 12.6 10*3/uL — ABNORMAL HIGH (ref 4.0–10.5)
nRBC: 0 % (ref 0.0–0.2)

## 2020-06-03 LAB — CK: Total CK: 446 U/L — ABNORMAL HIGH (ref 49–397)

## 2020-06-03 MED ORDER — DOXYCYCLINE HYCLATE 100 MG PO CAPS: 100.0000 mg | ORAL_CAPSULE | Freq: Two times a day (BID) | ORAL | 0 refills | Status: DC

## 2020-06-03 MED ORDER — KETOROLAC TROMETHAMINE 60 MG/2ML IM SOLN
60.0000 mg | Freq: Once | INTRAMUSCULAR | Status: AC
  Administered 2020-06-03: 60 mg via INTRAMUSCULAR
  Filled 2020-06-03: qty 2

## 2020-06-03 MED ORDER — NAPROXEN 500 MG PO TABS: 500.0000 mg | ORAL_TABLET | Freq: Two times a day (BID) | ORAL | 0 refills | Status: DC

## 2020-06-03 MED ORDER — CYCLOBENZAPRINE HCL 10 MG PO TABS: 10.0000 mg | ORAL_TABLET | Freq: Two times a day (BID) | ORAL | 0 refills | Status: DC | PRN

## 2020-06-03 NOTE — Discharge Instructions (Signed)
Please see the list of doctors below if you do not have a family doctor.  Your testing today was unremarkable and did not show any specific findings, there is no signs of urinary infection at all, your urine was very clean.  I would encourage you to take naproxen twice a day as needed for pain, you may also use Flexeril which can help with your muscle spasms that may be contributing to this as well.  Follow-up with your doctor in 1 week  ER for worsening symptoms including fevers vomiting or chills.  [facility] Primary Care Doctor List    Kari Baars MD. Specialty: Pulmonary Disease Contact information: [address]  [address]  [place] [address]  [phone]   Syliva Overman, MD. Specialty: [facility] Medicine Contact information: [address]  [place] [address]  [phone]   Lilyan Punt, MD. Specialty: [facility] Contact information: [address]  [place] [address]  [phone]   Avon Gully, MD Specialty: Internal Medicine Contact information: [address] [place] [address]  [phone]   Catalina Pizza, MD. Specialty: Internal Medicine Contact information: [address]  [place] [address]  [phone]    [facility] (Dr. Selena Batten) Specialty: Family Medicine Contact information: [address]  [place] [address]  [phone]   John Giovanni, MD. Specialty: [facility] Contact information: [address]  [address]  [place] [address]  [phone]   Carylon Perches, MD. Specialty: Internal Medicine Contact information: [address]  [address]  [place] [address]  [phone]    [facility]  [address] [place], [place] [address] [phone]  Services [facility] offers a variety of basic health services.  Services include but are not limited to: Blood pressure checks  Heart rate  checks  Blood sugar checks  Urine analysis  Rapid strep tests  Pregnancy tests.  Health education and referrals  People needing more complex services will be directed to a physician online. Using these virtual visits, doctors can evaluate and prescribe medicine and treatments. There will be no medication on-site, though [facility] will help patients fill their prescriptions at little to no cost.   For More information please go to: [email or web address]

## 2020-06-03 NOTE — ED Triage Notes (Signed)
Patient complaining of lower back pain for past three weeks. Concerned that he has UTI due to dark color of urine, frequent urination. States that he has been seen at another ER and tested for UTI and they report negative results. Pain/symptoms worse over past day.
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
## 2020-06-05 LAB — URINE CULTURE: Culture: NO GROWTH

## 2020-06-06 ENCOUNTER — Ambulatory Visit (HOSPITAL_COMMUNITY): Payer: Medicare HMO | Admitting: Physical Therapy

## 2020-06-10 ENCOUNTER — Other Ambulatory Visit: Payer: Self-pay

## 2020-06-10 ENCOUNTER — Emergency Department (HOSPITAL_COMMUNITY)
Admission: EM | Admit: 2020-06-10 | Discharge: 2020-06-10 | Disposition: A | Discharge status: Home / Self Care | Payer: Medicare HMO | Attending: Emergency Medicine | Admitting: Emergency Medicine

## 2020-06-10 ENCOUNTER — Emergency Department (HOSPITAL_COMMUNITY): Payer: Medicare HMO

## 2020-06-10 ENCOUNTER — Encounter (HOSPITAL_COMMUNITY): Payer: Self-pay | Admitting: Emergency Medicine

## 2020-06-10 DIAGNOSIS — I5020 Unspecified systolic (congestive) heart failure: Secondary | ICD-10-CM | POA: Diagnosis not present

## 2020-06-10 DIAGNOSIS — R0602 Shortness of breath: Secondary | ICD-10-CM | POA: Diagnosis present

## 2020-06-10 DIAGNOSIS — Z87891 Personal history of nicotine dependence: Secondary | ICD-10-CM | POA: Diagnosis not present

## 2020-06-10 DIAGNOSIS — Z91040 Latex allergy status: Secondary | ICD-10-CM | POA: Insufficient documentation

## 2020-06-10 DIAGNOSIS — Z79899 Other long term (current) drug therapy: Secondary | ICD-10-CM | POA: Diagnosis not present

## 2020-06-10 DIAGNOSIS — R600 Localized edema: Secondary | ICD-10-CM | POA: Diagnosis not present

## 2020-06-10 DIAGNOSIS — Z96642 Presence of left artificial hip joint: Secondary | ICD-10-CM | POA: Diagnosis not present

## 2020-06-10 DIAGNOSIS — I110 Hypertensive heart disease with heart failure: Secondary | ICD-10-CM | POA: Diagnosis not present

## 2020-06-10 DIAGNOSIS — Z20822 Contact with and (suspected) exposure to covid-19: Secondary | ICD-10-CM | POA: Diagnosis not present

## 2020-06-10 LAB — CBC WITH DIFFERENTIAL/PLATELET
Abs Immature Granulocytes: 0.03 10*3/uL (ref 0.00–0.07)
Basophils Absolute: 0.1 10*3/uL (ref 0.0–0.1)
Basophils Relative: 1 %
Eosinophils Absolute: 0.3 10*3/uL (ref 0.0–0.5)
Eosinophils Relative: 2 %
HCT: 45.1 % (ref 39.0–52.0)
Hemoglobin: 14.8 g/dL (ref 13.0–17.0)
Immature Granulocytes: 0 %
Lymphocytes Relative: 19 %
Lymphs Abs: 2.4 10*3/uL (ref 0.7–4.0)
MCH: 30.1 pg (ref 26.0–34.0)
MCHC: 32.8 g/dL (ref 30.0–36.0)
MCV: 91.7 fL (ref 80.0–100.0)
Monocytes Absolute: 1.1 10*3/uL — ABNORMAL HIGH (ref 0.1–1.0)
Monocytes Relative: 8 %
Neutro Abs: 9 10*3/uL — ABNORMAL HIGH (ref 1.7–7.7)
Neutrophils Relative %: 70 %
Platelets: 173 10*3/uL (ref 150–400)
RBC: 4.92 MIL/uL (ref 4.22–5.81)
RDW: 12.8 % (ref 11.5–15.5)
WBC: 12.8 10*3/uL — ABNORMAL HIGH (ref 4.0–10.5)
nRBC: 0 % (ref 0.0–0.2)

## 2020-06-10 LAB — COMPREHENSIVE METABOLIC PANEL
ALT: 25 U/L (ref 0–44)
AST: 27 U/L (ref 15–41)
Albumin: 4 g/dL (ref 3.5–5.0)
Alkaline Phosphatase: 65 U/L (ref 38–126)
Anion gap: 5 (ref 5–15)
BUN: 7 mg/dL (ref 6–20)
CO2: 31 mmol/L (ref 22–32)
Calcium: 8.9 mg/dL (ref 8.9–10.3)
Chloride: 100 mmol/L (ref 98–111)
Creatinine, Ser: 0.85 mg/dL (ref 0.61–1.24)
GFR, Estimated: >60 mL/min (ref >60)
Glucose, Bld: 106 mg/dL — ABNORMAL HIGH (ref 70–99)
Potassium: 3.8 mmol/L (ref 3.5–5.1)
Sodium: 136 mmol/L (ref 135–145)
Total Bilirubin: 0.5 mg/dL (ref 0.3–1.2)
Total Protein: 8.2 g/dL — ABNORMAL HIGH (ref 6.5–8.1)

## 2020-06-10 LAB — RESP PANEL BY RT-PCR (FLU A&B, COVID) ARPGX2
Influenza A by PCR: NEGATIVE
Influenza B by PCR: NEGATIVE
SARS Coronavirus 2 by RT PCR: NEGATIVE

## 2020-06-10 LAB — BRAIN NATRIURETIC PEPTIDE: B Natriuretic Peptide: 56 pg/mL (ref 0.0–100.0)

## 2020-06-10 MED ORDER — MAGNESIUM SULFATE 2 GM/50ML IV SOLN: 2.0000 g | Freq: Once | INTRAVENOUS | Status: DC

## 2020-06-10 MED ORDER — FUROSEMIDE 10 MG/ML IJ SOLN
40.0000 mg | Freq: Once | INTRAMUSCULAR | Status: AC
  Administered 2020-06-10: 40 mg via INTRAVENOUS
  Filled 2020-06-10: qty 4

## 2020-06-10 MED ORDER — METHYLPREDNISOLONE SODIUM SUCC 125 MG IJ SOLR: 125.0000 mg | Freq: Once | INTRAMUSCULAR | Status: DC

## 2020-06-10 NOTE — ED Triage Notes (Signed)
Pt c/o sob x 2 weeks with any movement. Pt is wheelchair bound.
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
## 2020-06-10 NOTE — Discharge Instructions (Addendum)
Make sure you take your fluid pill daily and follow-up with your doctor this week

## 2020-06-13 ENCOUNTER — Ambulatory Visit (HOSPITAL_COMMUNITY): Payer: Medicare HMO | Admitting: Physical Therapy

## 2020-06-19 ENCOUNTER — Encounter: Payer: Self-pay | Admitting: Family Medicine

## 2020-06-19 ENCOUNTER — Other Ambulatory Visit: Payer: Self-pay

## 2020-06-19 ENCOUNTER — Other Ambulatory Visit: Payer: Self-pay | Admitting: Family Medicine

## 2020-06-19 ENCOUNTER — Ambulatory Visit (INDEPENDENT_AMBULATORY_CARE_PROVIDER_SITE_OTHER): Payer: Medicare HMO | Admitting: Family Medicine

## 2020-06-19 VITALS — BP 140/75 | HR 99

## 2020-06-19 DIAGNOSIS — Z1159 Encounter for screening for other viral diseases: Secondary | ICD-10-CM | POA: Diagnosis not present

## 2020-06-19 DIAGNOSIS — Z0000 Encounter for general adult medical examination without abnormal findings: Secondary | ICD-10-CM

## 2020-06-19 DIAGNOSIS — I10 Essential (primary) hypertension: Secondary | ICD-10-CM | POA: Diagnosis not present

## 2020-06-19 DIAGNOSIS — I5022 Chronic systolic (congestive) heart failure: Secondary | ICD-10-CM

## 2020-06-19 DIAGNOSIS — Z1211 Encounter for screening for malignant neoplasm of colon: Secondary | ICD-10-CM

## 2020-06-19 DIAGNOSIS — R0602 Shortness of breath: Secondary | ICD-10-CM

## 2020-06-19 DIAGNOSIS — Z6841 Body Mass Index (BMI) 40.0 and over, adult: Secondary | ICD-10-CM

## 2020-06-19 MED ORDER — SHINGRIX 50 MCG/0.5ML IM SUSR: 0.5000 mL | Freq: Once | INTRAMUSCULAR | 0 refills | Status: AC

## 2020-06-19 NOTE — Patient Instructions (Signed)
It was a pleasure to see you today!  Thank you for choosing [facility] for your primary care.   Jordan Jensen was seen for annual physical.   Our plans for today were: We will collect blood work. I will follow up with you if there are any abnormal results.  I will also prescribe some medication inhalers for your asthma controlling.    To keep you healthy, please keep in mind the following health maintenance items that you are due for:   Colonoscopy: I have submitted a referral to gastroenterology to call you in order to schedule an appointment for a colonoscopy. We will also complete hepatitis C screening today for your blood work.   You should return to our clinic in 4 weeks for asthma.   Best Wishes,   Dr. Neita Garnet

## 2020-06-19 NOTE — Progress Notes (Deleted)
    SUBJECTIVE:   CHIEF COMPLAINT / HPI: annual physical   ***  Health maintenance Colonoscopy, pneumonia vaccine, shingles vaccine, hepatitis C screening and COVID-vaccine booster recommended.  PERTINENT  PMH / PSH:  Heart failure Hypertension Quadriplegia Disability of walking Lymphedema  OBJECTIVE:   There were no vitals taken for this visit.  General: male appearing stated age in no acute distress, seated in wheelchair  HEENT: MMM, no oral lesions noted,Neck non-tender without lymphadenopathy, masses or thyromegaly*** Cardio: Normal S1 and S2, no S3 or S4. Rhythm is regular***. No murmurs or rubs.  Bilateral radial pulses palpable Pulm: Clear to auscultation bilaterally, no crackles, wheezing, or diminished breath sounds. Normal respiratory effort, stable on *** Abdomen: Bowel sounds normal. Abdomen soft and non-tender. *** Extremities: No peripheral edema. Warm/ well perfused. *** Neuro: pt alert and oriented x4    ASSESSMENT/PLAN:   No problem-specific Assessment & Plan notes found for this encounter.     Ronnald Ramp, MD [facility]   {    This will disappear when note is signed, click to select method of visit    :1}

## 2020-06-20 LAB — COMPREHENSIVE METABOLIC PANEL
ALT: 23 IU/L (ref 0–44)
AST: 31 IU/L (ref 0–40)
Albumin/Globulin Ratio: 1.5 (ref 1.2–2.2)
Albumin: 4.4 g/dL (ref 3.8–4.9)
Alkaline Phosphatase: 82 IU/L (ref 44–121)
BUN/Creatinine Ratio: 11 (ref 9–20)
BUN: 12 mg/dL (ref 6–24)
Bilirubin Total: 0.4 mg/dL (ref 0.0–1.2)
CO2: 27 mmol/L (ref 20–29)
Calcium: 9.5 mg/dL (ref 8.7–10.2)
Chloride: 97 mmol/L (ref 96–106)
Creatinine, Ser: 1.05 mg/dL (ref 0.76–1.27)
Globulin, Total: 3 g/dL (ref 1.5–4.5)
Glucose: 99 mg/dL (ref 65–99)
Potassium: 4.4 mmol/L (ref 3.5–5.2)
Sodium: 139 mmol/L (ref 134–144)
Total Protein: 7.4 g/dL (ref 6.0–8.5)
eGFR: 86 mL/min/{1.73_m2} (ref >59)

## 2020-06-20 LAB — LIPID PANEL
Chol/HDL Ratio: 4.6 ratio (ref 0.0–5.0)
Cholesterol, Total: 166 mg/dL (ref 100–199)
HDL: 36 mg/dL — ABNORMAL LOW (ref >39)
LDL Chol Calc (NIH): 104 mg/dL — ABNORMAL HIGH (ref 0–99)
Triglycerides: 149 mg/dL (ref 0–149)
VLDL Cholesterol Cal: 26 mg/dL (ref 5–40)

## 2020-06-20 LAB — HEPATITIS C ANTIBODY: Hep C Virus Ab: <0.1 s/co ratio (ref 0.0–0.9)

## 2020-06-21 NOTE — Progress Notes (Signed)
    SUBJECTIVE:   CHIEF COMPLAINT / HPI: annual physical   SOB Episode, ED evaluation  Patient reports that he recently had an episode of dyspnea and was seen in the ED. He reports having normal results and was instructed to take lasix daily instead of PRN for pulmonary edema. He reports that this helped his breathing to return to normal. Patient is requesting nebulizer treatment for his asthma in the event that he has an exacerbation. He adds that years ago he was prescribed an albuterol inhaler that made him have an allergic reaction so he was prescribed a different inhaler that he is not sure of the name. The second inhaler did help his breathing but again, he is unable to remember the name of the inhaler and is unclear as to whether it contained albuterol.  Health maintenance Colonoscopy, pneumonia vaccine, shingles vaccine, hepatitis C screening and COVID-vaccine booster recommended.  PERTINENT  PMH / PSH:  Heart failure Hypertension Disability of walking Lymphedema  OBJECTIVE:   BP 140/75   Pulse 99   SpO2 97%   General: male appearing stated age in no acute distress, seated in wheelchair  Cardio: Normal S1 and S2, no S3 or S4. Rhythm is regular. No murmurs or rubs.  Bilateral radial pulses palpable Pulm: Clear to auscultation bilaterally, no crackles, wheezing, or diminished breath sounds. Normal respiratory effort, stable on RA Abdomen: Bowel sounds normal. Abdomen soft and non-tender.  Extremities: bilateral LE edema, no erythematous or warm skin  Neuro: pt alert and oriented x4    ASSESSMENT/PLAN:   Essential hypertension BP elevated for goal  Not on specific BP medications other than Metoprolol for HF hx  CMP   Healthcare maintenance Hep C screening  GI referral for colonoscopy  Lipid panel   SOB (shortness of breath) Denies current dyspnea, SOB. Given episode and mixed picture of asthma symptoms and response to treatment vs treating for pulmonary edema in  setting HF, will schedule for PFTs to help narrow down diagnosis and best treatment modality going forward.  Reports concern for sleep apnea, will also refer for sleep study given hx of HF and obesity      Ronnald Ramp, MD [facility]

## 2020-06-22 DIAGNOSIS — Z0000 Encounter for general adult medical examination without abnormal findings: Secondary | ICD-10-CM | POA: Insufficient documentation

## 2020-06-22 NOTE — Assessment & Plan Note (Signed)
Denies current dyspnea, SOB. Given episode and mixed picture of asthma symptoms and response to treatment vs treating for pulmonary edema in setting HF, will schedule for PFTs to help narrow down diagnosis and best treatment modality going forward.  Reports concern for sleep apnea, will also refer for sleep study given hx of HF and obesity

## 2020-06-22 NOTE — Assessment & Plan Note (Signed)
Hep C screening  GI referral for colonoscopy  Lipid panel

## 2020-06-22 NOTE — Assessment & Plan Note (Signed)
BP elevated for goal  Not on specific BP medications other than Metoprolol for HF hx  CMP

## 2020-06-27 ENCOUNTER — Other Ambulatory Visit: Payer: Self-pay | Admitting: Family Medicine

## 2020-06-27 MED ORDER — ROSUVASTATIN CALCIUM 10 MG PO TABS: 10.0000 mg | ORAL_TABLET | Freq: Every day | ORAL | 3 refills | Status: DC

## 2020-06-29 ENCOUNTER — Ambulatory Visit (HOSPITAL_COMMUNITY): Payer: Medicare HMO | Attending: Family

## 2020-07-03 ENCOUNTER — Telehealth: Payer: Self-pay

## 2020-07-03 ENCOUNTER — Other Ambulatory Visit: Payer: Self-pay | Admitting: Family Medicine

## 2020-07-03 MED ORDER — TRIAMCINOLONE ACETONIDE 0.025 % EX OINT: 1.0000 "application " | TOPICAL_OINTMENT | Freq: Two times a day (BID) | CUTANEOUS | 0 refills | Status: DC

## 2020-07-03 MED ORDER — FLOVENT DISKUS 50 MCG/BLIST IN AEPB: 1.0000 | INHALATION_SPRAY | Freq: Two times a day (BID) | RESPIRATORY_TRACT | 12 refills | Status: DC

## 2020-07-03 NOTE — Telephone Encounter (Signed)
Patient calls nurse line regarding continued issues with breathing. This has been an ongoing issue since 06/10/2020. Patient is requesting inhaler, he states this was discussed at visit on 6/14. Patient had previous allergic reaction to albuterol inhaler. Patient states that provider was going to look into alternative inhaler.  Patient is able to speak in complete sentences at this time and is not have active SHOB.   Patient also states that he was supposed to receive rx for eczema.   ED precautions given.   Please advise.

## 2020-07-03 NOTE — Telephone Encounter (Signed)
Rx for ICS sent to pharmacy along with triamcinolone for eczema.

## 2020-07-22 NOTE — Progress Notes (Signed)
    SUBJECTIVE:   CHIEF COMPLAINT / HPI: f/u for SOB and lipid check   Patient presents for f/u regarding SOB and prescription for inhaler. Today he reports he has been having breathing episodes. The last one was on Friday andhe felt SOB.   He has not been scheduled for PFT testing yet due to supply limitations. He denies SOB today. Patient reports wheezing during his previous episodes.   Request for continued therapy Patient requested if he could be enrolled in home health physical therapy for his ankle pain.  Patient states that he has been having difficulties with leaving the home and his wife inquires if he can be enrolled in physical therapy at home.  Patient reports that he is having more difficulty with preparing to leave the home due to his shortness of breath episodes and that he gets tired with exertion especially getting in the car.  F/u for Sleep Apnea concern  Patient was referred to pulmonology for sleep study to evaluate for OSA. He reports has not heard from anyone regarding scheduling as of yet.   Lipid Assessment  Patient reports adherence to rosuvastatin therapy since last visit. He is agreeable to LDL check today. Has been adhering and reports no myalgias.   HM  Patient was referred to GI for colonoscopy. He reports he has not heard from a scheduler regarding this.    PERTINENT  PMH / PSH:  HF  Hx of SOB  Morbid Obesity   OBJECTIVE:   BP (!) 184/156   Pulse 84   Ht 5\' 11"  (1.803 m)   BMI 59.97 kg/m   General: male appearing stated age in no acute distress Cardio: Normal S1 and S2, no S3 or S4. Rhythm is regular.  Bilateral radial pulses palpable Pulm: Clear to auscultation bilaterally, no crackles, wheezing, or diminished breath sounds. Normal respiratory effort, stable on RA Abdomen: Bowel sounds normal. Abdomen soft and non-tender.  Extremities: bilateral LE edema   ASSESSMENT/PLAN:   Disability of walking Patient reports increased dyspnea on exertion  especially with transferring into home vehicles.  He is requesting to have physical therapy continued for his ankle pain in the home. A referral for home health submitted, PT  Healthcare maintenance Pneumonia vaccine completed today. Referral for colonoscopy submitted at prior visit.  We will follow-up with the referral coordinator for colonoscopy scheduling.  SOB (shortness of breath) We will contact pharmacist to see if patient is candidate for PFT studies.  Suspect that his shortness of breath may be due to respiratory issues given report of wheezing and improvement after albuterol nebulizer treatments.  We will hold off on prescribing albuterol at this time given patient's history of allergy to unknown inhaler. - plan to schedule PFT studies in the near future  Snoring Follow-up with referral coordinator for pulmonology referral for sleep study.     , MD [facility]

## 2020-07-23 ENCOUNTER — Other Ambulatory Visit: Payer: Self-pay

## 2020-07-23 ENCOUNTER — Encounter: Payer: Self-pay | Admitting: Family Medicine

## 2020-07-23 ENCOUNTER — Ambulatory Visit (INDEPENDENT_AMBULATORY_CARE_PROVIDER_SITE_OTHER): Payer: Medicare HMO | Admitting: Family Medicine

## 2020-07-23 VITALS — BP 184/156 | HR 84 | Ht 71.0 in

## 2020-07-23 DIAGNOSIS — Z0000 Encounter for general adult medical examination without abnormal findings: Secondary | ICD-10-CM | POA: Diagnosis not present

## 2020-07-23 DIAGNOSIS — E538 Deficiency of other specified B group vitamins: Secondary | ICD-10-CM

## 2020-07-23 DIAGNOSIS — Z23 Encounter for immunization: Secondary | ICD-10-CM

## 2020-07-23 DIAGNOSIS — R0683 Snoring: Secondary | ICD-10-CM

## 2020-07-23 DIAGNOSIS — R5383 Other fatigue: Secondary | ICD-10-CM

## 2020-07-23 DIAGNOSIS — I5022 Chronic systolic (congestive) heart failure: Secondary | ICD-10-CM

## 2020-07-23 DIAGNOSIS — E1169 Type 2 diabetes mellitus with other specified complication: Secondary | ICD-10-CM

## 2020-07-23 DIAGNOSIS — S14105S Unspecified injury at C5 level of cervical spinal cord, sequela: Secondary | ICD-10-CM

## 2020-07-23 DIAGNOSIS — E785 Hyperlipidemia, unspecified: Secondary | ICD-10-CM

## 2020-07-23 DIAGNOSIS — R262 Difficulty in walking, not elsewhere classified: Secondary | ICD-10-CM

## 2020-07-23 DIAGNOSIS — R0602 Shortness of breath: Secondary | ICD-10-CM

## 2020-07-23 MED ORDER — METOPROLOL SUCCINATE ER 25 MG PO TB24
12.5000 mg | ORAL_TABLET | Freq: Every day | ORAL | 1 refills | Status: DC
Start: 2020-07-23 — End: 2020-09-28

## 2020-07-23 NOTE — Patient Instructions (Addendum)
It was a pleasure to see you today!  Thank you for choosing [facility] for your primary care.   Jordan Jensen was seen for follow up for shortness of breath and cholesterol.   Our plans for today were: We will order lab work today to see how your cholesterol is responding to the new medication.  I would like for you to have a test to see if your shortness of breath is related to asthma or COPD in addition to your heart failure. Continue your daily lasix.  We will work on scheduling you for your sleep study, colonoscopy and breathing function tests.  I will hold off on the albuterol inhaler for now just to make sure to not interfere with the testing. I would recommend using the inhaler you have daily to see if it helps cut down on your breathing episodes in the meantime.    You should return to our clinic in four weeks to follow up with breathing.   Best Wishes,   Dr. Neita Garnet

## 2020-07-24 LAB — VITAMIN B12: Vitamin B-12: 265 pg/mL (ref 232–1245)

## 2020-07-24 LAB — LDL CHOLESTEROL, DIRECT: LDL Direct: 72 mg/dL (ref 0–99)

## 2020-07-25 ENCOUNTER — Telehealth: Payer: Self-pay | Admitting: Family Medicine

## 2020-07-25 ENCOUNTER — Encounter: Payer: Self-pay | Admitting: Gastroenterology

## 2020-07-25 NOTE — Assessment & Plan Note (Signed)
Follow-up with referral coordinator for pulmonology referral for sleep study.

## 2020-07-25 NOTE — Assessment & Plan Note (Addendum)
Pneumonia vaccine completed today. Referral for colonoscopy submitted at prior visit.  We will follow-up with the referral coordinator for colonoscopy scheduling.

## 2020-07-25 NOTE — Assessment & Plan Note (Signed)
Patient reports increased dyspnea on exertion especially with transferring into home vehicles.  He is requesting to have physical therapy continued for his ankle pain in the home. A referral for home health submitted, PT

## 2020-07-25 NOTE — Assessment & Plan Note (Signed)
>>  ASSESSMENT AND PLAN FOR DISABILITY OF WALKING WRITTEN ON 07/25/2020 12:12 PM BY SIMMONS-ROBINSON, MAKIERA, MD  Patient reports increased dyspnea on exertion especially with transferring into home vehicles.  He is requesting to have physical therapy continued for his ankle pain in the home. A referral for home health submitted, PT

## 2020-07-25 NOTE — Telephone Encounter (Signed)
Contacted patient to discuss results of direct LDL, 72.  Discussed recommendation for goal of less than 70.  Patient voiced understanding.  Vitamin B12 result 265 and within normal range, patient voiced understanding.  Discussed colonoscopy and sleep study referral with referral coordinator in recommended to give phone numbers to the patient in order to contact offices to schedule colonoscopy and pulmonology referral.  Patient was able to record both telephone numbers and stated that he will call to schedule those appointments today.  Ronnald Ramp, MD [facility], PGY-3 [phone]

## 2020-07-25 NOTE — Assessment & Plan Note (Addendum)
We will contact pharmacist to see if patient is candidate for PFT studies.  Suspect that his shortness of breath may be due to respiratory issues given report of wheezing and improvement after albuterol nebulizer treatments.  We will hold off on prescribing albuterol at this time given patient's history of allergy to unknown inhaler. - plan to schedule PFT studies in the near future

## 2020-07-27 ENCOUNTER — Telehealth: Payer: Self-pay | Admitting: Student

## 2020-07-27 NOTE — Telephone Encounter (Signed)
Jordan Jensen thought HR was low per echo. It actually had documented HR of 81 bpm. Patient was encouraged by this. His pcp thought his SOB could be lung related but he plans to keep 08/30/20 appointment with B.Strader, PA-C

## 2020-07-27 NOTE — Telephone Encounter (Signed)
Patient states he came in couple months ago had ECHO. It confirms his heart beat slow, SOB, fatigue, still taking all his meds  [phone]

## 2020-08-03 ENCOUNTER — Telehealth: Payer: Self-pay | Admitting: Pulmonary Disease

## 2020-08-03 DIAGNOSIS — R0602 Shortness of breath: Secondary | ICD-10-CM

## 2020-08-03 NOTE — Telephone Encounter (Signed)
Jordan Jensen from Dr. Irving Burton office is requesting that the pt do a PFT test and stated she would update the referral notes and I did inform her that she could make an active request as well being they are apart of [facility]. Pls regard; [phone]

## 2020-08-03 NOTE — Telephone Encounter (Signed)
ATC Jasmine to let her know that order has been placed for PFT to be done before OV. Left voicemail letting her know. Nothing further needed at this time.

## 2020-08-13 ENCOUNTER — Other Ambulatory Visit: Payer: Self-pay

## 2020-08-13 ENCOUNTER — Other Ambulatory Visit (HOSPITAL_COMMUNITY)
Admission: RE | Admit: 2020-08-13 | Discharge: 2020-08-13 | Disposition: A | Discharge status: Home / Self Care | Payer: Medicare HMO | Source: Ambulatory Visit | Attending: Pulmonary Disease | Admitting: Pulmonary Disease

## 2020-08-13 DIAGNOSIS — Z20822 Contact with and (suspected) exposure to covid-19: Secondary | ICD-10-CM | POA: Diagnosis not present

## 2020-08-13 DIAGNOSIS — Z01812 Encounter for preprocedural laboratory examination: Secondary | ICD-10-CM | POA: Insufficient documentation

## 2020-08-13 LAB — SARS CORONAVIRUS 2 (TAT 6-24 HRS): SARS Coronavirus 2: NEGATIVE

## 2020-08-15 ENCOUNTER — Other Ambulatory Visit: Payer: Self-pay

## 2020-08-15 ENCOUNTER — Ambulatory Visit (HOSPITAL_COMMUNITY)
Admission: RE | Admit: 2020-08-15 | Discharge: 2020-08-15 | Disposition: A | Discharge status: Home / Self Care | Payer: Medicare HMO | Source: Ambulatory Visit | Attending: Pulmonary Disease | Admitting: Pulmonary Disease

## 2020-08-15 DIAGNOSIS — R0602 Shortness of breath: Secondary | ICD-10-CM | POA: Diagnosis not present

## 2020-08-15 LAB — PULMONARY FUNCTION TEST
DL/VA % pred: 120 %
DL/VA: 5.3 ml/min/mmHg/L
DLCO unc % pred: 53 %
DLCO unc: 16.02 ml/min/mmHg
FEF 25-75 Pre: 1.23 L/sec
FEF2575-%Pred-Pre: 36 %
FEV1-%Pred-Pre: 41 %
FEV1-Pre: 1.44 L
FEV1FVC-%Pred-Pre: 96 %
FEV6-%Pred-Pre: 44 %
FEV6-Pre: 1.86 L
FEV6FVC-%Pred-Pre: 102 %
FVC-%Pred-Pre: 43 %
FVC-Pre: 1.86 L
Pre FEV1/FVC ratio: 77 %
Pre FEV6/FVC Ratio: 100 %

## 2020-08-26 ENCOUNTER — Encounter: Payer: Self-pay | Admitting: Emergency Medicine

## 2020-08-26 ENCOUNTER — Other Ambulatory Visit: Payer: Self-pay

## 2020-08-26 ENCOUNTER — Ambulatory Visit
Admission: EM | Admit: 2020-08-26 | Discharge: 2020-08-26 | Disposition: A | Discharge status: Home / Self Care | Payer: Medicare HMO | Attending: Physician Assistant | Admitting: Physician Assistant

## 2020-08-26 DIAGNOSIS — R109 Unspecified abdominal pain: Secondary | ICD-10-CM

## 2020-08-26 LAB — POCT URINALYSIS DIP (MANUAL ENTRY)
Bilirubin, UA: NEGATIVE
Blood, UA: NEGATIVE
Glucose, UA: NEGATIVE mg/dL
Ketones, POC UA: NEGATIVE mg/dL
Leukocytes, UA: NEGATIVE
Nitrite, UA: NEGATIVE
Protein Ur, POC: NEGATIVE mg/dL
Spec Grav, UA: 1.025 (ref 1.010–1.025)
Urobilinogen, UA: 0.2 E.U./dL
pH, UA: 6.5 (ref 5.0–8.0)

## 2020-08-26 MED ORDER — KETOROLAC TROMETHAMINE 30 MG/ML IJ SOLN
30.0000 mg | Freq: Once | INTRAMUSCULAR | Status: AC
  Administered 2020-08-26: 30 mg via INTRAMUSCULAR

## 2020-08-26 MED ORDER — NITROFURANTOIN MONOHYD MACRO 100 MG PO CAPS: 100.0000 mg | ORAL_CAPSULE | Freq: Two times a day (BID) | ORAL | 0 refills | Status: DC

## 2020-08-26 MED ORDER — TAMSULOSIN HCL 0.4 MG PO CAPS: 0.8000 mg | ORAL_CAPSULE | Freq: Every day | ORAL | Status: AC
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
## 2020-08-26 NOTE — Discharge Instructions (Addendum)
Take medications as prescribed If symptoms worsen or you develop fever, chills, nausea, vomiting or worsening pain follow up in the emergency department for evaluation

## 2020-08-26 NOTE — ED Triage Notes (Signed)
Lower back and and frequent urination x 3 days with some burning during urination.

## 2020-08-30 ENCOUNTER — Telehealth (INDEPENDENT_AMBULATORY_CARE_PROVIDER_SITE_OTHER): Payer: Medicare HMO | Admitting: Student

## 2020-08-30 ENCOUNTER — Other Ambulatory Visit: Payer: Self-pay

## 2020-08-30 ENCOUNTER — Encounter: Payer: Self-pay | Admitting: Student

## 2020-08-30 VITALS — Ht 71.0 in

## 2020-08-30 DIAGNOSIS — Z79899 Other long term (current) drug therapy: Secondary | ICD-10-CM

## 2020-08-30 DIAGNOSIS — I5022 Chronic systolic (congestive) heart failure: Secondary | ICD-10-CM | POA: Diagnosis not present

## 2020-08-30 DIAGNOSIS — E782 Mixed hyperlipidemia: Secondary | ICD-10-CM

## 2020-08-30 DIAGNOSIS — I10 Essential (primary) hypertension: Secondary | ICD-10-CM | POA: Diagnosis not present

## 2020-08-30 MED ORDER — ENTRESTO 24-26 MG PO TABS: 1.0000 | ORAL_TABLET | Freq: Two times a day (BID) | ORAL | 6 refills | Status: DC
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
## 2020-08-30 NOTE — Patient Instructions (Signed)
Medication Instructions:   START Entresto 24/26 mg twice a day   You will receive a 30 day Trial for free  *If you need a refill on your cardiac medications before your next appointment, please call your pharmacy*   Lab Work:  BMET in 2 weeks (09/14/20)  If you have labs (blood work) drawn today and your tests are completely normal, you will receive your results only by: MyChart Message (if you have MyChart) OR A paper copy in the mail If you have any lab test that is abnormal or we need to change your treatment, we will call you to review the results.   Testing/Procedures: None    Follow-Up: At [facility], you and your health needs are our priority.  As part of our continuing mission to provide you with exceptional heart care, we have created designated Provider Care Teams.  These Care Teams include your primary Cardiologist (physician) and Advanced Practice Providers (APPs -  Physician Assistants and Nurse Practitioners) who all work together to provide you with the care you need, when you need it.  We recommend signing up for the patient portal called "MyChart".  Sign up information is provided on this After Visit Summary.  MyChart is used to connect with patients for Virtual Visits (Telemedicine).  Patients are able to view lab/test results, encounter notes, upcoming appointments, etc.  Non-urgent messages can be sent to your provider as well.   To learn more about what you can do with MyChart, go to [email or web address].    Your next appointment:   3 month(s)  The format for your next appointment:   In Person  Provider:   You may see a Cardiologist or one of the following Advanced Practice Providers on your designated Care Team:   Randall An, [place]     Other Instructions None

## 2020-08-31 ENCOUNTER — Encounter: Payer: Self-pay | Admitting: Pulmonary Disease

## 2020-08-31 ENCOUNTER — Ambulatory Visit (INDEPENDENT_AMBULATORY_CARE_PROVIDER_SITE_OTHER): Payer: Medicare HMO | Admitting: Pulmonary Disease

## 2020-08-31 ENCOUNTER — Telehealth: Payer: Self-pay

## 2020-08-31 ENCOUNTER — Institutional Professional Consult (permissible substitution): Payer: Medicare HMO | Admitting: Pulmonary Disease

## 2020-08-31 ENCOUNTER — Other Ambulatory Visit: Payer: Self-pay

## 2020-08-31 VITALS — BP 126/74 | HR 84 | Temp 98.1°F | Ht 71.0 in | Wt >= 6400 oz

## 2020-08-31 DIAGNOSIS — G4720 Circadian rhythm sleep disorder, unspecified type: Secondary | ICD-10-CM

## 2020-08-31 DIAGNOSIS — J4540 Moderate persistent asthma, uncomplicated: Secondary | ICD-10-CM

## 2020-08-31 DIAGNOSIS — Z6841 Body Mass Index (BMI) 40.0 and over, adult: Secondary | ICD-10-CM

## 2020-08-31 DIAGNOSIS — R0683 Snoring: Secondary | ICD-10-CM

## 2020-08-31 MED ORDER — ALBUTEROL SULFATE HFA 108 (90 BASE) MCG/ACT IN AERS: 2.0000 | INHALATION_SPRAY | Freq: Four times a day (QID) | RESPIRATORY_TRACT | 5 refills | Status: DC | PRN

## 2020-08-31 MED ORDER — ALBUTEROL SULFATE (2.5 MG/3ML) 0.083% IN NEBU: 2.5000 mg | INHALATION_SOLUTION | Freq: Four times a day (QID) | RESPIRATORY_TRACT | 5 refills | Status: DC | PRN

## 2020-08-31 NOTE — Telephone Encounter (Signed)
Pt has EF between 30-40%-difficult to determine with echo, is w/c bound due to spinal cord injury and has BMI of 58.6.   He needs to be seen for OV.    Cancellation of PV and procedure until pt can be seen.

## 2020-08-31 NOTE — Telephone Encounter (Signed)
Appt schedule for OV 9/23 at 1:50 with Dr.Cunningham

## 2020-08-31 NOTE — Patient Instructions (Signed)
Will arrange for home nebulizer  Albuterol every 6 hours as needed for cough, wheeze, or chest congestion  Will arrange for home sleep study  Will call to arrange for follow up after sleep study reviewed
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
## 2020-09-03 ENCOUNTER — Ambulatory Visit (INDEPENDENT_AMBULATORY_CARE_PROVIDER_SITE_OTHER): Payer: Medicare HMO | Admitting: Family Medicine

## 2020-09-03 ENCOUNTER — Other Ambulatory Visit: Payer: Self-pay

## 2020-09-03 ENCOUNTER — Encounter: Payer: Self-pay | Admitting: Family Medicine

## 2020-09-03 VITALS — BP 101/67 | HR 88

## 2020-09-03 DIAGNOSIS — Z9989 Dependence on other enabling machines and devices: Secondary | ICD-10-CM

## 2020-09-03 DIAGNOSIS — L821 Other seborrheic keratosis: Secondary | ICD-10-CM

## 2020-09-03 DIAGNOSIS — Z9889 Other specified postprocedural states: Secondary | ICD-10-CM

## 2020-09-03 DIAGNOSIS — M5440 Lumbago with sciatica, unspecified side: Secondary | ICD-10-CM | POA: Diagnosis not present

## 2020-09-03 DIAGNOSIS — K611 Rectal abscess: Secondary | ICD-10-CM | POA: Diagnosis not present

## 2020-09-03 DIAGNOSIS — R262 Difficulty in walking, not elsewhere classified: Secondary | ICD-10-CM | POA: Diagnosis not present

## 2020-09-03 DIAGNOSIS — G8250 Quadriplegia, unspecified: Secondary | ICD-10-CM

## 2020-09-03 DIAGNOSIS — G8929 Other chronic pain: Secondary | ICD-10-CM

## 2020-09-03 MED ORDER — FLUTICASONE PROPIONATE 50 MCG/ACT NA SUSP: 1.0000 | Freq: Every day | NASAL | 6 refills | Status: DC

## 2020-09-03 NOTE — Patient Instructions (Addendum)
I have refilled your Flonase to help with the post nasal drainage causing you to cough. Please use this daily as prescribed.   I have also contacted our referral specialist to follow up with the physical therapy referral. If I need to resend the request, I will do so and have them contact you to schedule.   We have scheduled you with the dermatology clinic for considering removal vs biospy of the lesion on your forehead. Please follow up as scheduled in this packet.

## 2020-09-03 NOTE — Progress Notes (Signed)
SUBJECTIVE:   CHIEF COMPLAINT / HPI: follow up sleep study    Patient reports that he has been able to establish with pulmonology and is planning to have a sleep study soon.  He reports that he is also been able to follow-up with his cardiologist and has been back on his Entresto for 3 days.   Need for physical therapy Patient states that he was still like to pursue home health physical therapy to help him with ambulating.  He reports that he is able to stand using crutches but believe that he will benefit from physical therapy at home as it is difficult for him to travel to the outpatient physical therapy center.  Patient would also like home health physical therapy because it is difficult for his wife to help him maneuver to the car and into different locations using the wheelchair.  Patient states that he has not received any of the messages for the previous referral and is requesting another.  Lumbar back pain Patient states that he has been having lower back pain in his lumbar region.  He reports that he was without pain for a long period of time.  He states that before when he had pain like this he thought it was a urinary tract infection.  He states that he was evaluated in the urgent care and had normal urine studies at that time.  He was treated with antibiotic that he was able to finish over this past weekend.  Patient states that in the distant history, when his back was bothering him it was the wound in his anal region that was infected and draining.  Patient is requesting an exam to make sure that this is not happening again.  He denies any fevers or chills.  He denies any GI upset.  Patient states that he does not have any dysuria.  He states that he does not have any pain in his buttocks.  Seborrheic keratosis Patient states that he would like to have the lesion on the back of his head examined by dermatology clinic.  He would like to have it removed or at least biopsied as it  often will bleed when traumatized during a haircut.  PERTINENT  PMH / PSH:  Morbid obesity Heart failure  OBJECTIVE:   BP 101/67   Pulse 88   SpO2 95%   General: Obese male appearing stated age in no acute distress sitting in wheelchair HEENT: MMM, no oral lesions noted,Neck non-tender without lymphadenopathy Cardio: Normal S1 and S2, no S3 or S4. Rhythm is regular. Pulm: Clear to auscultation bilaterally, no crackles, wheezing, or diminished breath sounds. Normal respiratory effort Abdomen: Bowel sounds normal. Abdomen soft and non-tender.  Extremities: Diffuse bilateral lower extremity edema. Warm & well perfused.  Buttocks: Patient has a small fissure just proximal to anus, do not note any purulent drainage or erythema in anal region, no tenderness to palpation Skin: Seborrheic keratosis located on the left side of patient's head, no purulent drainage, no bleeding, consistent coloration throughout  ASSESSMENT/PLAN:   Perirectal abscess History of perirectal abscess.  This area does not appear to be infected without any erythema or purulent drainage today.  Will not start antibiotic course at this time.  Low back pain Given patient's recent urine analysis with normal results and continued pain, will check CBC and basic metabolic panel for any abnormalities that may be contributing to his back pain including any renal dysfunction  Disability of walking Patient requesting home health  physical therapy to help with building endurance and strength to help with ambulation.  Patient usually wheelchair-bound during appointments and uses wheelchair at home.  He often tries to walk around using crutches but easily fatigues. -Referral submitted for home health physical therapy  Seborrheic keratoses Lesion located on the back left side of patient's head.  This is not painful or pruritic.  No bleeding or drainage from this area today.  Patient scheduled for dermatology clinic for biopsy or  removal.     Ronnald Ramp, MD [facility]

## 2020-09-06 DIAGNOSIS — L821 Other seborrheic keratosis: Secondary | ICD-10-CM | POA: Insufficient documentation

## 2020-09-06 NOTE — Assessment & Plan Note (Signed)
History of perirectal abscess.  This area does not appear to be infected without any erythema or purulent drainage today.  Will not start antibiotic course at this time.

## 2020-09-06 NOTE — Assessment & Plan Note (Signed)
Lesion located on the back left side of patient's head.  This is not painful or pruritic.  No bleeding or drainage from this area today.  Patient scheduled for dermatology clinic for biopsy or removal.

## 2020-09-06 NOTE — Assessment & Plan Note (Signed)
>>  ASSESSMENT AND PLAN FOR DISABILITY OF WALKING WRITTEN ON 09/06/2020  7:17 PM BY Ronnald Ramp, MD  Patient requesting home health physical therapy to help with building endurance and strength to help with ambulation.  Patient usually wheelchair-bound during appointments and uses wheelchair at home.  He often tries to walk around using crutches but easily fatigues. -Referral submitted for home health physical therapy

## 2020-09-06 NOTE — Assessment & Plan Note (Signed)
Given patient's recent urine analysis with normal results and continued pain, will check CBC and basic metabolic panel for any abnormalities that may be contributing to his back pain including any renal dysfunction

## 2020-09-06 NOTE — Assessment & Plan Note (Signed)
Patient requesting home health physical therapy to help with building endurance and strength to help with ambulation.  Patient usually wheelchair-bound during appointments and uses wheelchair at home.  He often tries to walk around using crutches but easily fatigues. -Referral submitted for home health physical therapy

## 2020-09-24 ENCOUNTER — Encounter: Payer: Medicare HMO | Admitting: Gastroenterology

## 2020-09-24 ENCOUNTER — Telehealth: Payer: Self-pay | Admitting: Pulmonary Disease

## 2020-09-24 NOTE — Telephone Encounter (Signed)
DME who brings equipment should show pt how to use it when they drop it off.  Attempted to call pt but unable to reach. Left message again for him to return call.

## 2020-09-24 NOTE — Telephone Encounter (Signed)
Attempted to call pt but unable to reach. Left message for him to return call. °

## 2020-09-25 ENCOUNTER — Telehealth: Payer: Medicare HMO | Admitting: Physician Assistant

## 2020-09-25 DIAGNOSIS — R059 Cough, unspecified: Secondary | ICD-10-CM

## 2020-09-25 DIAGNOSIS — I509 Heart failure, unspecified: Secondary | ICD-10-CM

## 2020-09-25 NOTE — Progress Notes (Signed)
Virtual Visit Consent   Jordan Jensen, you are scheduled for a virtual visit with a [facility] provider today.     Just as with appointments in the office, your consent must be obtained to participate.  Your consent will be active for this visit and any virtual visit you may have with one of our providers in the next 365 days.     If you have a MyChart account, a copy of this consent can be sent to you electronically.  All virtual visits are billed to your insurance company just like a traditional visit in the office.    As this is a virtual visit, video technology does not allow for your provider to perform a traditional examination.  This may limit your provider's ability to fully assess your condition.  If your provider identifies any concerns that need to be evaluated in person or the need to arrange testing (such as labs, EKG, etc.), we will make arrangements to do so.     Although advances in technology are sophisticated, we cannot ensure that it will always work on either your end or our end.  If the connection with a video visit is poor, the visit may have to be switched to a telephone visit.  With either a video or telephone visit, we are not always able to ensure that we have a secure connection.     I need to obtain your verbal consent now.   Are you willing to proceed with your visit today?    Jordan Jensen has provided verbal consent on 09/25/2020 for a virtual visit (video or telephone).   Piedad Climes, [place]   Date: 09/25/2020 11:19 AM   Virtual Visit via Video Note   I, Piedad Climes, connected with  Jordan Jensen  ([id number], October 06, 1970) on 09/25/20 at 11:15 AM EDT by a video-enabled telemedicine application and verified that I am speaking with the correct person using two identifiers.  Location: Patient: Virtual Visit Location Patient: Home Provider: Virtual Visit Location Provider: Home Office   I discussed the limitations of evaluation and management by  telemedicine and the availability of in person appointments. The patient expressed understanding and agreed to proceed.    History of Present Illness: Jordan Jensen is a 50 y.o. who identifies as a male who was assigned male at birth, and is being seen today for cough x 3 weeks, worse with laying back and associated with a draining feeling in the left lung. Denies fever, chills, malaise. No increased work of breathing thus far unless he lays back. He mainly stays upright in his lift chair. Also noting increase in lower extremity edema despite use of Furosemide and Entresto (recently started per patient). Has not reached out to his PCP or specialists.   HPI: HPI  Problems:  Patient Active Problem List   Diagnosis Date Noted   Seborrheic keratoses 09/06/2020   Healthcare maintenance 06/22/2020   Disability of walking 03/15/2020   Ankle pain 09/14/2018   Cutaneous abscess of back (any part, except buttock)    Sepsis (HCC) 07/07/2018   Morbid obesity with BMI of 60.0-69.9, adult (HCC) 07/07/2018   Acute cystitis without hematuria 12/23/2017   Perirectal abscess 07/04/2017   Pulmonary nodule, left 09/17/2016   Quadriplegia and quadriparesis (HCC)    Essential hypertension    CHF NYHA class III (HCC)    SOB (shortness of breath) 10/03/2013   Fever 10/02/2013   Low back pain 03/23/2013   Spinal  cord injury at C5-C7 level without injury of spinal bone (HCC) 05/03/2012   Lymphedema 11/07/2011   Abscess 08/05/2011   Morbid obesity (HCC) 07/05/2011   Snoring 07/05/2011   Bilateral leg edema    Post traumatic myelopathy (HCC)     Allergies:  Allergies  Allergen Reactions   Entresto [Sacubitril-Valsartan] Other (See Comments)    headache   Ace Inhibitors Cough   Latex Itching and Rash    cellulitis   Medications:  Current Outpatient Medications:    acetaminophen (TYLENOL) 325 MG tablet, Take 2 tablets (650 mg total) by mouth every 6 (six) hours as needed for mild pain (or Fever >/=  101)., Disp: , Rfl:    albuterol (PROVENTIL) (2.5 MG/3ML) 0.083% nebulizer solution, Take 3 mLs (2.5 mg total) by nebulization every 6 (six) hours as needed for wheezing or shortness of breath., Disp: 360 mL, Rfl: 5   albuterol (VENTOLIN HFA) 108 (90 Base) MCG/ACT inhaler, Inhale 2 puffs into the lungs every 6 (six) hours as needed for wheezing or shortness of breath., Disp: 1 each, Rfl: 5   fluticasone (FLOVENT DISKUS) 50 MCG/BLIST diskus inhaler, Inhale 1 puff into the lungs 2 (two) times daily., Disp: 1 each, Rfl: 12   furosemide (LASIX) 40 MG tablet, Take 1 tablet (40 mg total) by mouth daily as needed for fluid., Disp: 90 tablet, Rfl: 3   meloxicam (MOBIC) 7.5 MG tablet, Take 7.5 mg by mouth daily. , Disp: , Rfl:    metoprolol succinate (TOPROL-XL) 25 MG 24 hr tablet, Take 0.5 tablets (12.5 mg total) by mouth daily., Disp: 90 tablet, Rfl: 1   montelukast (SINGULAIR) 10 MG tablet, Take 10 mg by mouth daily., Disp: , Rfl:    Multiple Vitamin (MULTIVITAMIN WITH MINERALS) TABS tablet, Take 1 tablet by mouth daily., Disp: , Rfl:    naproxen (NAPROSYN) 500 MG tablet, Take 1 tablet (500 mg total) by mouth 2 (two) times daily with a meal., Disp: 30 tablet, Rfl: 0   rosuvastatin (CRESTOR) 10 MG tablet, Take 1 tablet (10 mg total) by mouth daily., Disp: 90 tablet, Rfl: 3   sacubitril-valsartan (ENTRESTO) 24-26 MG, Take 1 tablet by mouth 2 (two) times daily., Disp: 60 tablet, Rfl: 6   traMADol (ULTRAM) 50 MG tablet, Take 50 mg by mouth every 6 (six) hours as needed., Disp: , Rfl:    triamcinolone (KENALOG) 0.025 % ointment, Apply 1 application topically 2 (two) times daily., Disp: 30 g, Rfl: 0  Observations/Objective: Patient is well-developed, well-nourished in no acute distress.  Resting comfortably at home. Sitting upright in his chair.  Head is normocephalic, atraumatic.  No labored breathing. Speech is clear and coherent with logical content.  Patient is alert and oriented at baseline.    Assessment and Plan: 1. Cough  2. Acute on chronic congestive heart failure, unspecified heart failure type (HCC) Mild cough x 3 weeks, worsening. Worse with recumbent positioning and associated with increased swelling of extremities. Concern for CHF exacerbation. He is to reach out directly to his Cardiologist and PCP for in person evaluation. UC/ER if unable to be seen.   No charge as he is triaged to higher care facility.   Follow Up Instructions: I discussed the assessment and treatment plan with the patient. The patient was provided an opportunity to ask questions and all were answered. The patient agreed with the plan and demonstrated an understanding of the instructions.  A copy of instructions were sent to the patient via MyChart.  The patient was  advised to call back or seek an in-person evaluation if the symptoms worsen or if the condition fails to improve as anticipated.  Time:  I spent 15 minutes with the patient via telehealth technology discussing the above problems/concerns.    Piedad Climes, PA-C

## 2020-09-28 ENCOUNTER — Ambulatory Visit (INDEPENDENT_AMBULATORY_CARE_PROVIDER_SITE_OTHER): Payer: Medicare HMO | Admitting: Gastroenterology

## 2020-09-28 ENCOUNTER — Encounter: Payer: Self-pay | Admitting: Gastroenterology

## 2020-09-28 VITALS — BP 142/80 | HR 64 | Ht 71.0 in | Wt >= 6400 oz

## 2020-09-28 DIAGNOSIS — Z1212 Encounter for screening for malignant neoplasm of rectum: Secondary | ICD-10-CM

## 2020-09-28 DIAGNOSIS — I5042 Chronic combined systolic (congestive) and diastolic (congestive) heart failure: Secondary | ICD-10-CM | POA: Diagnosis not present

## 2020-09-28 DIAGNOSIS — Z993 Dependence on wheelchair: Secondary | ICD-10-CM

## 2020-09-28 DIAGNOSIS — Z1211 Encounter for screening for malignant neoplasm of colon: Secondary | ICD-10-CM | POA: Diagnosis not present

## 2020-09-28 DIAGNOSIS — G4733 Obstructive sleep apnea (adult) (pediatric): Secondary | ICD-10-CM | POA: Diagnosis not present

## 2020-09-28 NOTE — Patient Instructions (Signed)
If you are age 51 or older, your body mass index should be between 23-30. Your Body mass index is 58.58 kg/m. If this is out of the aforementioned range listed, please consider follow up with your Primary Care Provider.  If you are age 43 or younger, your body mass index should be between 19-25. Your Body mass index is 58.58 kg/m. If this is out of the aformentioned range listed, please consider follow up with your Primary Care Provider.   Your provider has ordered Cologuard testing as an option for colon cancer screening. This is performed by [employer] and may be out of network with your insurance. PRIOR to completing the test, it is YOUR responsibility to contact your insurance about covered benefits for this test. Your out of pocket expense could be anywhere from $0.00 to $649.00.   When you call to check coverage with your insurer, please provide the following information:   -The ONLY provider of Cologuard is [facility], dispensing  - CPT code for Cologuard is [phone].  [facility] NPI # [id number]  -[employer] Tax ID # [id number]   We have already sent your demographic and insurance information to [employer] (phone number [phone]) and they should contact you within the next week regarding your test. If you have not heard from them within the next week, please call our office at [phone].   The [facility] providers would like to encourage you to use [facility] to communicate with providers for non-urgent requests or questions.  Due to long hold times on the telephone, sending your provider a message by [facility] may be a faster and more efficient way to get a response.  Please allow 48 business hours for a response.  Please remember that this is for non-urgent requests.   It was a pleasure to see you today!  Thank you for trusting me with your gastrointestinal care!     Scott E. [name], MD
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
## 2020-10-01 ENCOUNTER — Encounter: Payer: Self-pay | Admitting: Gastroenterology

## 2020-10-01 NOTE — Telephone Encounter (Signed)
Called and spoke with pt and he stated that they mailed him the nebulizer machine and supplies and he is still not sure how to use this.  I advised him if he would stop by the office with the machine that someone here can show him how to use the machine. Pt voiced his understanding.

## 2020-10-04 ENCOUNTER — Ambulatory Visit: Payer: Medicaid - Dental

## 2020-11-12 ENCOUNTER — Emergency Department (HOSPITAL_COMMUNITY): Payer: Medicare HMO

## 2020-11-12 ENCOUNTER — Other Ambulatory Visit: Payer: Self-pay

## 2020-11-12 ENCOUNTER — Emergency Department (HOSPITAL_COMMUNITY)
Admission: EM | Admit: 2020-11-12 | Discharge: 2020-11-13 | Disposition: A | Discharge status: Home / Self Care | Payer: Medicare HMO | Attending: Emergency Medicine | Admitting: Emergency Medicine

## 2020-11-12 DIAGNOSIS — R0602 Shortness of breath: Secondary | ICD-10-CM | POA: Insufficient documentation

## 2020-11-12 DIAGNOSIS — I890 Lymphedema, not elsewhere classified: Secondary | ICD-10-CM | POA: Diagnosis present

## 2020-11-12 DIAGNOSIS — Z7952 Long term (current) use of systemic steroids: Secondary | ICD-10-CM | POA: Diagnosis not present

## 2020-11-12 DIAGNOSIS — R2243 Localized swelling, mass and lump, lower limb, bilateral: Secondary | ICD-10-CM | POA: Diagnosis not present

## 2020-11-12 DIAGNOSIS — R531 Weakness: Secondary | ICD-10-CM | POA: Insufficient documentation

## 2020-11-12 DIAGNOSIS — I509 Heart failure, unspecified: Secondary | ICD-10-CM | POA: Diagnosis not present

## 2020-11-12 DIAGNOSIS — W19XXXA Unspecified fall, initial encounter: Secondary | ICD-10-CM | POA: Diagnosis not present

## 2020-11-12 DIAGNOSIS — I110 Hypertensive heart disease with heart failure: Secondary | ICD-10-CM | POA: Diagnosis not present

## 2020-11-12 DIAGNOSIS — Z87891 Personal history of nicotine dependence: Secondary | ICD-10-CM | POA: Insufficient documentation

## 2020-11-12 DIAGNOSIS — Z20822 Contact with and (suspected) exposure to covid-19: Secondary | ICD-10-CM | POA: Diagnosis not present

## 2020-11-12 DIAGNOSIS — Z91040 Latex allergy status: Secondary | ICD-10-CM | POA: Insufficient documentation

## 2020-11-12 DIAGNOSIS — M25571 Pain in right ankle and joints of right foot: Secondary | ICD-10-CM

## 2020-11-12 DIAGNOSIS — Z79899 Other long term (current) drug therapy: Secondary | ICD-10-CM | POA: Diagnosis not present

## 2020-11-12 DIAGNOSIS — R600 Localized edema: Secondary | ICD-10-CM

## 2020-11-12 DIAGNOSIS — R059 Cough, unspecified: Secondary | ICD-10-CM | POA: Insufficient documentation

## 2020-11-12 DIAGNOSIS — I10 Essential (primary) hypertension: Secondary | ICD-10-CM

## 2020-11-12 DIAGNOSIS — M7989 Other specified soft tissue disorders: Secondary | ICD-10-CM

## 2020-11-12 DIAGNOSIS — M25572 Pain in left ankle and joints of left foot: Secondary | ICD-10-CM

## 2020-11-12 DIAGNOSIS — Z96649 Presence of unspecified artificial hip joint: Secondary | ICD-10-CM | POA: Diagnosis not present

## 2020-11-12 DIAGNOSIS — M25579 Pain in unspecified ankle and joints of unspecified foot: Secondary | ICD-10-CM | POA: Diagnosis present

## 2020-11-12 LAB — CBC WITH DIFFERENTIAL/PLATELET
Abs Immature Granulocytes: 0.1 10*3/uL — ABNORMAL HIGH (ref 0.00–0.07)
Basophils Absolute: 0.1 10*3/uL (ref 0.0–0.1)
Basophils Relative: 0 %
Eosinophils Absolute: 0.1 10*3/uL (ref 0.0–0.5)
Eosinophils Relative: 1 %
HCT: 45.6 % (ref 39.0–52.0)
Hemoglobin: 14.5 g/dL (ref 13.0–17.0)
Immature Granulocytes: 1 %
Lymphocytes Relative: 8 %
Lymphs Abs: 1.3 10*3/uL (ref 0.7–4.0)
MCH: 29.4 pg (ref 26.0–34.0)
MCHC: 31.8 g/dL (ref 30.0–36.0)
MCV: 92.3 fL (ref 80.0–100.0)
Monocytes Absolute: 0.9 10*3/uL (ref 0.1–1.0)
Monocytes Relative: 6 %
Neutro Abs: 14 10*3/uL — ABNORMAL HIGH (ref 1.7–7.7)
Neutrophils Relative %: 84 %
Platelets: 169 10*3/uL (ref 150–400)
RBC: 4.94 MIL/uL (ref 4.22–5.81)
RDW: 13.1 % (ref 11.5–15.5)
WBC: 16.5 10*3/uL — ABNORMAL HIGH (ref 4.0–10.5)
nRBC: 0 % (ref 0.0–0.2)

## 2020-11-12 LAB — COMPREHENSIVE METABOLIC PANEL
ALT: 20 U/L (ref 0–44)
AST: 28 U/L (ref 15–41)
Albumin: 3.7 g/dL (ref 3.5–5.0)
Alkaline Phosphatase: 59 U/L (ref 38–126)
Anion gap: 6 (ref 5–15)
BUN: 11 mg/dL (ref 6–20)
CO2: 31 mmol/L (ref 22–32)
Calcium: 8.8 mg/dL — ABNORMAL LOW (ref 8.9–10.3)
Chloride: 98 mmol/L (ref 98–111)
Creatinine, Ser: 0.93 mg/dL (ref 0.61–1.24)
GFR, Estimated: >60 mL/min (ref >60)
Glucose, Bld: 114 mg/dL — ABNORMAL HIGH (ref 70–99)
Potassium: 4.3 mmol/L (ref 3.5–5.1)
Sodium: 135 mmol/L (ref 135–145)
Total Bilirubin: 1.1 mg/dL (ref 0.3–1.2)
Total Protein: 7.7 g/dL (ref 6.5–8.1)

## 2020-11-12 LAB — BRAIN NATRIURETIC PEPTIDE: B Natriuretic Peptide: 75 pg/mL (ref 0.0–100.0)

## 2020-11-12 LAB — RESP PANEL BY RT-PCR (FLU A&B, COVID) ARPGX2
Influenza A by PCR: NEGATIVE
Influenza B by PCR: NEGATIVE
SARS Coronavirus 2 by RT PCR: NEGATIVE

## 2020-11-12 MED ORDER — FUROSEMIDE 10 MG/ML IJ SOLN
40.0000 mg | Freq: Once | INTRAMUSCULAR | Status: AC
  Administered 2020-11-12: 40 mg via INTRAVENOUS
  Filled 2020-11-12: qty 4

## 2020-11-12 NOTE — ED Triage Notes (Signed)
Pt arrived by EMS from home complaining of weakness x3days, increased leg swelling and a fall this morning Hx of CHF and spinal cord injury uses crutches to get around but has not been able to move well the past few days  Increased swelling in legs from CHF  Pt has a fall this morning injuring his right leg. Denies hitting his head or having an LOC
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
## 2020-11-12 NOTE — Discharge Instructions (Signed)
1) strongly recommend referral to bariatric/weight loss program 2) strongly recommended significant reduction in daily caloric intake especially given lack of physical activity/mobility 3)Very low-salt diet advised, avoid high caloric drinks including sodas and juices-----water preferred 4) please use Lasix as needed for excessive fluid retention 5)Morbid obesity with generalized weakness and deconditioning as well as knee and ankle sprains--- Home health physical therapy requested

## 2020-11-12 NOTE — ED Notes (Signed)
Per MD, pt will be d/c from ED and needs HHPT/RN. Discussed with pt's wife who is agreeable. Referred to [facility] who has had pt in the past. Kandee Keen with [facility] reports they likely can accept, but can't confirm until note from MD is in to review. MD updated and aware orders are needed.

## 2020-11-13 DIAGNOSIS — R531 Weakness: Secondary | ICD-10-CM | POA: Diagnosis not present

## 2020-11-13 MED ORDER — FUROSEMIDE 10 MG/ML IJ SOLN
40.0000 mg | Freq: Once | INTRAMUSCULAR | Status: AC
  Administered 2020-11-13: 40 mg via INTRAVENOUS
  Filled 2020-11-13: qty 4

## 2020-11-16 LAB — COLOGUARD: COLOGUARD: POSITIVE — AB

## 2020-11-19 ENCOUNTER — Telehealth: Payer: Self-pay

## 2020-11-19 NOTE — Telephone Encounter (Signed)
Colin Mulders [facility] OT calls nurse line requesting VO for [facility] OT as follows.   1x a week for 4 weeks.   VO given per [facility] protocol.

## 2020-11-19 NOTE — Telephone Encounter (Signed)
Archie Patten from [facility] calling for PT verbal orders as follows:  2 time(s) weekly for 8 week(s)  Verbal orders given per [facility] protocol  Patient is also requesting order for bariatric hospital bed (all electric) to be sent to [facility] in [place]. Fax number [phone].   Patient is requesting paperwork to be completed for [facility] services. I will place the form in your box, however, face to face visit is needed for both hospital bed and PCS services.   Patient is scheduled for 11/27/20.   Veronda Prude, RN

## 2020-11-20 ENCOUNTER — Other Ambulatory Visit: Payer: Self-pay

## 2020-11-20 DIAGNOSIS — G4733 Obstructive sleep apnea (adult) (pediatric): Secondary | ICD-10-CM | POA: Diagnosis not present

## 2020-11-20 DIAGNOSIS — R0683 Snoring: Secondary | ICD-10-CM

## 2020-11-22 ENCOUNTER — Ambulatory Visit: Payer: Medicare HMO

## 2020-11-22 NOTE — Progress Notes (Signed)
[name],  Your Cologuard test was positive.  Although false positive tests occur, a colonoscopy needs to be performed to further investigate for colon cancer or large polyps.  Your colonoscopy will need to be performed in the hospital setting because of your comorbidities.  You will be contacted by our office to discuss scheduling and receive further instructions.  Because of limited hospital procedure slots, it is possible a different provider from myself will perform your colonoscopy, in order to get your procedure completed in a timely fashion.  Bonita Quin,  Can you please assist Mr. Spinella with getting set up for a diagnostic colonoscopy at [facility]?

## 2020-11-23 ENCOUNTER — Telehealth: Payer: Self-pay | Admitting: Pulmonary Disease

## 2020-11-23 DIAGNOSIS — G4733 Obstructive sleep apnea (adult) (pediatric): Secondary | ICD-10-CM | POA: Diagnosis not present

## 2020-11-23 NOTE — Telephone Encounter (Signed)
HST 11/20/20 >> AHI 71, SpO2 low 73%   Please inform him that his sleep study shows severe obstructive sleep apnea.  Please arrange for ROV with me or NP to discuss treatment options.

## 2020-11-26 NOTE — Telephone Encounter (Signed)
Spoke with patient regarding HST results. They verbalized understanding. No further questions. Patient scheduled for follow up on 12/07/2020. Nothing further needed at this time.

## 2020-11-26 NOTE — Progress Notes (Deleted)
    SUBJECTIVE:   CHIEF COMPLAINT / HPI: hospital f/u ,paperwork for [facility] and hospital bed   Hospital f/u for fall and leg swelling  Patient was evaluated in the ED for a fall and leg swelling. Lab work at that time noted elevated glucose. Also had xr of the right knee that showed some degenerative changes in the medial compartment but no concerns for fractures.   Request for hospital bed  Patient presents with request for hospital bed stating that [facility] bed, heavy-duty, extra wide, with weight capacity greater than 350 pounds, but less than or equal to 600 pounds, with any type side rails, without mattress [id number]  Patient is requesting personal care services to assist with ***   PERTINENT  PMH / PSH:  HFrEF 30-40% Morbid Obesity  Wheel Chair Use   OBJECTIVE:   There were no vitals taken for this visit.  Physical Exam   ASSESSMENT/PLAN:   No problem-specific Assessment & Plan notes found for this encounter.     Ronnald Ramp, MD [facility]

## 2020-11-27 ENCOUNTER — Inpatient Hospital Stay: Payer: Medicare HMO

## 2020-12-05 NOTE — Progress Notes (Signed)
  Patient was no show for this visit scheduled for 10:50AM 12/06/20.   Ronnald Ramp, MD [facility]
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
## 2020-12-06 ENCOUNTER — Ambulatory Visit (INDEPENDENT_AMBULATORY_CARE_PROVIDER_SITE_OTHER): Payer: Medicare HMO | Admitting: Student

## 2020-12-06 ENCOUNTER — Encounter: Payer: Self-pay | Admitting: Student

## 2020-12-06 ENCOUNTER — Ambulatory Visit (INDEPENDENT_AMBULATORY_CARE_PROVIDER_SITE_OTHER): Payer: Medicare HMO | Admitting: Family Medicine

## 2020-12-06 ENCOUNTER — Encounter: Payer: Self-pay | Admitting: Family Medicine

## 2020-12-06 ENCOUNTER — Other Ambulatory Visit: Payer: Self-pay

## 2020-12-06 ENCOUNTER — Emergency Department (HOSPITAL_COMMUNITY)
Admission: EM | Admit: 2020-12-06 | Discharge: 2020-12-06 | Disposition: A | Discharge status: Home / Self Care | Payer: Medicare HMO | Attending: Emergency Medicine | Admitting: Emergency Medicine

## 2020-12-06 ENCOUNTER — Encounter (HOSPITAL_COMMUNITY): Payer: Self-pay | Admitting: *Deleted

## 2020-12-06 VITALS — BP 132/78 | HR 84 | Ht 71.0 in

## 2020-12-06 DIAGNOSIS — Z96642 Presence of left artificial hip joint: Secondary | ICD-10-CM | POA: Insufficient documentation

## 2020-12-06 DIAGNOSIS — L03312 Cellulitis of back [any part except buttock]: Secondary | ICD-10-CM | POA: Diagnosis not present

## 2020-12-06 DIAGNOSIS — E782 Mixed hyperlipidemia: Secondary | ICD-10-CM

## 2020-12-06 DIAGNOSIS — D72829 Elevated white blood cell count, unspecified: Secondary | ICD-10-CM | POA: Diagnosis not present

## 2020-12-06 DIAGNOSIS — G4733 Obstructive sleep apnea (adult) (pediatric): Secondary | ICD-10-CM

## 2020-12-06 DIAGNOSIS — Z87891 Personal history of nicotine dependence: Secondary | ICD-10-CM | POA: Diagnosis not present

## 2020-12-06 DIAGNOSIS — Z91040 Latex allergy status: Secondary | ICD-10-CM | POA: Insufficient documentation

## 2020-12-06 DIAGNOSIS — Z79899 Other long term (current) drug therapy: Secondary | ICD-10-CM | POA: Insufficient documentation

## 2020-12-06 DIAGNOSIS — I10 Essential (primary) hypertension: Secondary | ICD-10-CM | POA: Diagnosis not present

## 2020-12-06 DIAGNOSIS — I890 Lymphedema, not elsewhere classified: Secondary | ICD-10-CM

## 2020-12-06 DIAGNOSIS — I5022 Chronic systolic (congestive) heart failure: Secondary | ICD-10-CM | POA: Diagnosis not present

## 2020-12-06 DIAGNOSIS — L02212 Cutaneous abscess of back [any part, except buttock]: Secondary | ICD-10-CM | POA: Diagnosis present

## 2020-12-06 DIAGNOSIS — I509 Heart failure, unspecified: Secondary | ICD-10-CM | POA: Diagnosis not present

## 2020-12-06 DIAGNOSIS — Z91199 Patient's noncompliance with other medical treatment and regimen due to unspecified reason: Secondary | ICD-10-CM

## 2020-12-06 DIAGNOSIS — I110 Hypertensive heart disease with heart failure: Secondary | ICD-10-CM | POA: Insufficient documentation

## 2020-12-06 LAB — BASIC METABOLIC PANEL
Anion gap: 7 (ref 5–15)
BUN: 9 mg/dL (ref 6–20)
CO2: 31 mmol/L (ref 22–32)
Calcium: 9 mg/dL (ref 8.9–10.3)
Chloride: 100 mmol/L (ref 98–111)
Creatinine, Ser: 0.89 mg/dL (ref 0.61–1.24)
GFR, Estimated: >60 mL/min (ref >60)
Glucose, Bld: 96 mg/dL (ref 70–99)
Potassium: 4.1 mmol/L (ref 3.5–5.1)
Sodium: 138 mmol/L (ref 135–145)

## 2020-12-06 LAB — CBC WITH DIFFERENTIAL/PLATELET
Abs Immature Granulocytes: 0.06 10*3/uL (ref 0.00–0.07)
Basophils Absolute: 0.1 10*3/uL (ref 0.0–0.1)
Basophils Relative: 0 %
Eosinophils Absolute: 0.2 10*3/uL (ref 0.0–0.5)
Eosinophils Relative: 2 %
HCT: 44.8 % (ref 39.0–52.0)
Hemoglobin: 13.8 g/dL (ref 13.0–17.0)
Immature Granulocytes: 0 %
Lymphocytes Relative: 14 %
Lymphs Abs: 2.1 10*3/uL (ref 0.7–4.0)
MCH: 29.5 pg (ref 26.0–34.0)
MCHC: 30.8 g/dL (ref 30.0–36.0)
MCV: 95.7 fL (ref 80.0–100.0)
Monocytes Absolute: 0.9 10*3/uL (ref 0.1–1.0)
Monocytes Relative: 6 %
Neutro Abs: 11.1 10*3/uL — ABNORMAL HIGH (ref 1.7–7.7)
Neutrophils Relative %: 78 %
Platelets: 155 10*3/uL (ref 150–400)
RBC: 4.68 MIL/uL (ref 4.22–5.81)
RDW: 13.5 % (ref 11.5–15.5)
WBC: 14.4 10*3/uL — ABNORMAL HIGH (ref 4.0–10.5)
nRBC: 0 % (ref 0.0–0.2)

## 2020-12-06 MED ORDER — METOPROLOL SUCCINATE ER 25 MG PO TB24: 25.0000 mg | ORAL_TABLET | Freq: Every day | ORAL | 3 refills | Status: DC

## 2020-12-06 MED ORDER — METOPROLOL SUCCINATE ER 25 MG PO TB24: 12.5000 mg | ORAL_TABLET | Freq: Every day | ORAL | 1 refills | Status: DC

## 2020-12-06 MED ORDER — DOXYCYCLINE HYCLATE 100 MG PO CAPS: 100.0000 mg | ORAL_CAPSULE | Freq: Two times a day (BID) | ORAL | 0 refills | Status: AC

## 2020-12-06 MED ORDER — DOXYCYCLINE HYCLATE 100 MG PO TABS
100.0000 mg | ORAL_TABLET | Freq: Once | ORAL | Status: AC
  Administered 2020-12-06: 100 mg via ORAL
  Filled 2020-12-06: qty 1

## 2020-12-06 NOTE — ED Notes (Addendum)
Pt noted with swollen, red, and warm area to right shoulder blade area on back. He reports hx of abscess in same area on back requiring iv antibiotics. Started on thanksgiving. Reports periods of chills yesterday but no fever. Afebrile this date. Skin intact. No drainage at site of abscess

## 2020-12-06 NOTE — Discharge Instructions (Addendum)
Complete the entire course of antibiotics as prescribed.  You may apply warm compresses to the area for up to 15 minutes at a time.  Ensure to have a barrier between your skin and the heat.  You may take 600 mg ibuprofen every 6 hours or 1000 g Tylenol every 6 hours as needed for pain.  Follow-up with your primary care provider if continuing symptoms.  Return to the emergency department if experiencing increasing/worsening redness, drainage, or worsening symptoms.

## 2020-12-06 NOTE — ED Triage Notes (Signed)
States he has an abscess on his back, history of same

## 2020-12-06 NOTE — ED Notes (Signed)
Pt to lobby via wheelchair with family member. Reviewed d/c paperwork, verbalized understanding, no further questions at this time.
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
## 2020-12-06 NOTE — Patient Instructions (Signed)
Medication Instructions:  INCREASE Toprol XL to 25 mg daily   Labwork: None today   Testing/Procedures: None today   Follow-Up: 2-3 months      Any Other Special Instructions Will Be Listed Below (If Applicable).    Keep daily BP log for 2-3 weeks and then drop off for [place] to review    If you need a refill on your cardiac medications before your next appointment, please call your pharmacy.

## 2020-12-07 ENCOUNTER — Ambulatory Visit: Payer: Medicare HMO | Admitting: Nurse Practitioner
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
## 2020-12-07 NOTE — Telephone Encounter (Signed)
Contacted patient via telephone in order to attempt to schedule for virtual visit. Schedule is currently blocked due to impending leave. Recommended patient to call [facility] during week of 12/5 to schedule virtual visit to discuss hospital bed and PCS services. Patient agreeable to this plan.   Ronnald Ramp, MD [facility], PGY-3 [phone]
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
## 2020-12-11 ENCOUNTER — Telehealth (INDEPENDENT_AMBULATORY_CARE_PROVIDER_SITE_OTHER): Payer: Medicare HMO | Admitting: Family Medicine

## 2020-12-11 ENCOUNTER — Encounter: Payer: Self-pay | Admitting: Family Medicine

## 2020-12-11 DIAGNOSIS — R059 Cough, unspecified: Secondary | ICD-10-CM | POA: Diagnosis not present

## 2020-12-11 DIAGNOSIS — Z9181 History of falling: Secondary | ICD-10-CM | POA: Diagnosis not present

## 2020-12-11 DIAGNOSIS — Z789 Other specified health status: Secondary | ICD-10-CM

## 2020-12-11 DIAGNOSIS — G8250 Quadriplegia, unspecified: Secondary | ICD-10-CM

## 2020-12-11 DIAGNOSIS — Z7409 Other reduced mobility: Secondary | ICD-10-CM | POA: Insufficient documentation

## 2020-12-11 MED ORDER — BENZONATATE 100 MG PO CAPS: 100.0000 mg | ORAL_CAPSULE | Freq: Two times a day (BID) | ORAL | 0 refills | Status: DC | PRN

## 2020-12-11 NOTE — Assessment & Plan Note (Signed)
Patient reports hx of multiple falls due to becoming SOB, ankle pain and morbid obesity Patient reports fall specifically related to trying to move from his elevated bed and requests hospital bed for safety reasons

## 2020-12-11 NOTE — Progress Notes (Signed)
[facility] Telemedicine Visit  Patient consented to have virtual visit and was identified by name and date of birth. Method of visit: Telephone  Encounter participants: Patient: Jordan Jensen - located at home address   Provider: Ronnald Ramp - located at [facility]   Chief Complaint: PCS and hospital bed, cough   HPI:  Request for [facility]  Patient reports needing personal care services to assist him with transferring to shower and bed safely due to hx of falls, chronic ankle pain, quadriplegia s/p spinal injury and morbid obesity in setting of HF with reduced EF. He reports feeling quickly "winded" while moving from the toilet to the shower. He is concerned that he will fall. He reports that he is mostly home alone during the day and unable to get assistance. He is able to feed himself. He does report needing help with grooming(putting on socks and shoes). He states that he spends most of the day unassisted as his spouse works during the day.   Request for Hospital Bed Patient reports that he sleeps in a high single sized bed. He reports falling from high bed in the recent months and was told that he had acute arthritis in his ankles. He is trying to work with PT to help improve his mobility. He reports having a hard time getting into and out of his bed.  He reports having a hard time stepping up to get into his bed which requires him to climb. He reports while trying to get out of the bed, he suffered a fall and twisted his ankle and required a call to the fire dept to help him. Patient has heart failure and has difficulty with SOB with lying flat and would benefit from having head of bed elevated to 30 degrees while sleeping. He is currently being evaluated for CPAP machine. Patient also has morbid obesity and would benefit from bariatric sized hospital bed due to size limits of current bed. As noted above, patient spends 12 hours or more alone at home as his spouse  works during the day.    Cough  Patient adds that he has been exposed to his granddaughter who had viral upper respiratory symptoms and now has a cough. He denies current fevers or chills.    ROS: per HPI  Pertinent PMHx:  HTN  HFrEF  Morbid Obesity  Cough   Exam:   Respiratory: speaking in clear sentences without signs of respiratory distress  Assessment/Plan:  Impaired mobility and ADLs Patient requesting PCS, form completed and will send to appropriate agency  Patient with reported difficulty with transferring from shower chair, also needs assistance with dressing  Patient's obesity and HF SOB makes things tasks difficult to do and patient at increased risk of falls with recent fall a few weeks ago   At high risk for injury related to fall Patient reports hx of multiple falls due to becoming SOB, ankle pain and morbid obesity Patient reports fall specifically related to trying to move from his elevated bed and requests hospital bed for safety reasons   Cough Discussed taking honey with warm fluids, prescription sent for tessalon perles Patient inquired about cough syrup with codeine, advised to try less potent medications and conservative methods for cough first, patient voiced understanding   Personal care services form completed  and signed. To be returned to requested agency for processing  Time spent during visit with patient: 12 minutes

## 2020-12-11 NOTE — Assessment & Plan Note (Signed)
Patient requesting PCS, form completed and will send to appropriate agency  Patient with reported difficulty with transferring from shower chair, also needs assistance with dressing  Patient's obesity and HF SOB makes things tasks difficult to do and patient at increased risk of falls with recent fall a few weeks ago

## 2020-12-13 NOTE — Assessment & Plan Note (Signed)
Discussed taking honey with warm fluids, prescription sent for tessalon perles Patient inquired about cough syrup with codeine, advised to try less potent medications and conservative methods for cough first, patient voiced understanding

## 2020-12-14 ENCOUNTER — Encounter: Payer: Self-pay | Admitting: Nurse Practitioner

## 2020-12-14 ENCOUNTER — Ambulatory Visit (INDEPENDENT_AMBULATORY_CARE_PROVIDER_SITE_OTHER): Payer: Medicare HMO | Admitting: Nurse Practitioner

## 2020-12-14 ENCOUNTER — Other Ambulatory Visit: Payer: Self-pay

## 2020-12-14 DIAGNOSIS — J4530 Mild persistent asthma, uncomplicated: Secondary | ICD-10-CM

## 2020-12-14 DIAGNOSIS — G4733 Obstructive sleep apnea (adult) (pediatric): Secondary | ICD-10-CM | POA: Diagnosis not present

## 2020-12-14 MED ORDER — BUDESONIDE-FORMOTEROL FUMARATE 80-4.5 MCG/ACT IN AERO: 2.0000 | INHALATION_SPRAY | Freq: Two times a day (BID) | RESPIRATORY_TRACT | 3 refills | Status: DC

## 2020-12-14 NOTE — Assessment & Plan Note (Addendum)
Symptoms consistent with mild to moderate persistent asthma. Eos 200. PFTs showed mixed obstruction and restriction with diffusion defect. Will initiate maintenance regimen with ICS/LABA. Instructed on proper use. SOB likely also related to deconditioning and morbid obesity; however, he does experiencing wheezing and notes certain triggers. See above plan.

## 2020-12-14 NOTE — Assessment & Plan Note (Signed)
AHI 71/hr with SpO2 low 73%. Will initiate CPAP auto therapy at 5-20 cmH2O. CPAP titration studied ordered given morbid obesity. Referred to healthy weight management.   Patient Instructions  -Continue albuterol inhaler 2 puffs or 3 mL neb every 6 hours as needed for shortness of breath or wheezing. Notify if symptoms persist despite rescue inhaler/neb use.  -Start Symbicort 80 2 puffs Twice daily, brush tongue and rinse mouth afterwards   Referral to DME for auto CPAP therapy 5-20cmH2O.  CPAP titration study ordered Referral to Healthy Weight Management in [place].  Use CPAP every night, minimum of 6 hours a night.  Change equipment every 30 days or as directed by DME. Wash your tubing with warm soap and water daily, hang to dry. Wash humidifier portion weekly.  Maintain clean equipment, as directed by home health agency.  Be aware of reduced alertness and do not drive or operate heavy machinery if experiencing this or drowsiness.  Exercise encouraged, as tolerated. Healthy weight management discussed.  Avoid or decrease alcohol consumption and medications that make you more sleepy, if possible. Notify if persistent daytime sleepiness occurs even with consistent use of CPAP.   We discussed how untreated sleep apnea puts an individual at risk for cardiac arrhthymias, pulm HTN, DM, stroke and increases their risk for daytime accidents. We also briefly reviewed treatment options including weight loss, side sleeping position, oral appliance, CPAP therapy or referral to ENT for possible surgical options  Asthma Action Plan in place Avoid triggers, when able.  Exercise encouraged. Notify if worsening symptoms upon exertion.  Notify and seek help if symptoms unrelieved by rescue inhaler.   Follow up in 1 month for reevaluation of asthma symptoms with Dr. Craige Cotta, Katie Maitland Lesiak,NP or APP. We will scheduled your follow up after initiating CPAP therapy at this visit, if you haven't started. If symptoms  do not improve or worsen, please contact office for sooner follow up or seek emergency care.
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
## 2020-12-14 NOTE — Patient Instructions (Addendum)
-  Continue albuterol inhaler 2 puffs or 3 mL neb every 6 hours as needed for shortness of breath or wheezing. Notify if symptoms persist despite rescue inhaler/neb use.  -Start Symbicort 80 2 puffs Twice daily, brush tongue and rinse mouth afterwards   Referral to DME for auto CPAP therapy 5-20cmH2O.  CPAP titration study ordered Referral to [facility] in [place].  Use CPAP every night, minimum of 6 hours a night.  Change equipment every 30 days or as directed by DME. Wash your tubing with warm soap and water daily, hang to dry. Wash humidifier portion weekly.  Maintain clean equipment, as directed by home health agency.  Be aware of reduced alertness and do not drive or operate heavy machinery if experiencing this or drowsiness.  Exercise encouraged, as tolerated. Healthy weight management discussed.  Avoid or decrease alcohol consumption and medications that make you more sleepy, if possible. Notify if persistent daytime sleepiness occurs even with consistent use of CPAP.   We discussed how untreated sleep apnea puts an individual at risk for cardiac arrhthymias, pulm HTN, DM, stroke and increases their risk for daytime accidents. We also briefly reviewed treatment options including weight loss, side sleeping position, oral appliance, CPAP therapy or referral to ENT for possible surgical options  Asthma Action Plan in place Avoid triggers, when able.  Exercise encouraged. Notify if worsening symptoms upon exertion.  Notify and seek help if symptoms unrelieved by rescue inhaler.   Follow up in 1 month for reevaluation of asthma symptoms with Dr. Craige Cotta, Katie Janiel Derhammer,NP or APP. We will scheduled your follow up after initiating CPAP therapy at this visit, if you haven't started. If symptoms do not improve or worsen, please contact office for sooner follow up or seek emergency care.

## 2020-12-14 NOTE — Progress Notes (Signed)
Reviewed and agree with assessment/plan.   Coralyn Helling, MD [facility] Pulmonary/Critical Care 12/14/2020, 5:55 PM Pager:  [phone]

## 2020-12-18 ENCOUNTER — Telehealth: Payer: Self-pay | Admitting: Gastroenterology

## 2020-12-18 NOTE — Telephone Encounter (Signed)
Inbound call from patient states ready to schedule colonoscopy at [facility]

## 2020-12-20 NOTE — Telephone Encounter (Signed)
Let pt know that we are waiting on the Feb schedule to see when/if Dr. [name] has any appts. Will call pt back when we know, he is aware.  Pt scheduled for previsit 02/18/21 at 1pm., colon scheduled at [facility] 03/18/21. At 11am, pt to arrive there at 9:30am. Pt would like previsit over the phone.

## 2020-12-27 ENCOUNTER — Other Ambulatory Visit: Payer: Self-pay

## 2020-12-27 DIAGNOSIS — Z1211 Encounter for screening for malignant neoplasm of colon: Secondary | ICD-10-CM

## 2021-01-09 ENCOUNTER — Telehealth: Payer: Self-pay | Admitting: Nurse Practitioner

## 2021-01-10 NOTE — Telephone Encounter (Signed)
Lm for patient.  

## 2021-01-11 ENCOUNTER — Ambulatory Visit: Payer: Medicare HMO | Admitting: Nurse Practitioner

## 2021-01-11 ENCOUNTER — Other Ambulatory Visit: Payer: Self-pay

## 2021-01-11 ENCOUNTER — Telehealth (INDEPENDENT_AMBULATORY_CARE_PROVIDER_SITE_OTHER): Payer: Medicare HMO | Admitting: Nurse Practitioner

## 2021-01-11 ENCOUNTER — Encounter: Payer: Self-pay | Admitting: Nurse Practitioner

## 2021-01-11 DIAGNOSIS — J4530 Mild persistent asthma, uncomplicated: Secondary | ICD-10-CM

## 2021-01-11 DIAGNOSIS — G4733 Obstructive sleep apnea (adult) (pediatric): Secondary | ICD-10-CM

## 2021-01-11 NOTE — Patient Instructions (Addendum)
-  Continue albuterol inhaler 2 puffs or 3 mL neb every 6 hours as needed for shortness of breath or wheezing. Notify if symptoms persist despite rescue inhaler/neb use.  -Continue Symbicort 80 2 puffs Twice daily, brush tongue and rinse mouth afterwards -Continue Singulair 10 mg At bedtime    Use CPAP every night, minimum of 6 hours a night.  Change equipment every 30 days or as directed by DME. Wash your tubing with warm soap and water daily, hang to dry. Wash humidifier portion weekly.  Be aware of reduced alertness and do not drive or operate heavy machinery if experiencing this or drowsiness.  Exercise encouraged, as tolerated. Healthy weight management discussed.  Avoid or decrease alcohol consumption and medications that make you more sleepy, if possible. Notify if persistent daytime sleepiness occurs even with consistent use of CPAP.   Attend CPAP titration study on 1/23.   We discussed how untreated sleep apnea puts an individual at risk for cardiac arrhthymias, pulm HTN, DM, stroke and increases their risk for daytime accidents. We also briefly reviewed treatment options including weight loss, side sleeping position, oral appliance, CPAP therapy or referral to ENT for possible surgical options   Asthma Action Plan in place Avoid triggers, when able.  Exercise encouraged. Notify if worsening symptoms upon exertion.  Notify and seek help if symptoms unrelieved by rescue inhaler.

## 2021-01-11 NOTE — Assessment & Plan Note (Addendum)
Improved. Continue on Symbicort 80 Twice daily. Albuterol PRN. Singulair 10 mg At bedtime.   Patient Instructions  -Continue albuterol inhaler 2 puffs or 3 mL neb every 6 hours as needed for shortness of breath or wheezing. Notify if symptoms persist despite rescue inhaler/neb use.  -Continue Symbicort 80 2 puffs Twice daily, brush tongue and rinse mouth afterwards -Continue Singulair 10 mg At bedtime    Use CPAP every night, minimum of 6 hours a night.  Change equipment every 30 days or as directed by DME. Wash your tubing with warm soap and water daily, hang to dry. Wash humidifier portion weekly.  Be aware of reduced alertness and do not drive or operate heavy machinery if experiencing this or drowsiness.  Exercise encouraged, as tolerated. Healthy weight management discussed.  Avoid or decrease alcohol consumption and medications that make you more sleepy, if possible. Notify if persistent daytime sleepiness occurs even with consistent use of CPAP.   Attend CPAP titration study on 1/23.   We discussed how untreated sleep apnea puts an individual at risk for cardiac arrhthymias, pulm HTN, DM, stroke and increases their risk for daytime accidents. We also briefly reviewed treatment options including weight loss, side sleeping position, oral appliance, CPAP therapy or referral to ENT for possible surgical options   Asthma Action Plan in place Avoid triggers, when able.  Exercise encouraged. Notify if worsening symptoms upon exertion.  Notify and seek help if symptoms unrelieved by rescue inhaler.
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
## 2021-01-11 NOTE — Progress Notes (Signed)
Reviewed and agree with assessment/plan.   Coralyn Helling, MD [facility]monary/Critical [facility] 01/11/2021, 5:37 PM Pager:  [phone]

## 2021-01-11 NOTE — Assessment & Plan Note (Signed)
Awaiting CPAP machine. Start therapy given severity of OSA then CPAP titration on 1/23. We will meet after to discuss changes in settings, if indicated. See above.

## 2021-01-15 ENCOUNTER — Telehealth: Payer: Self-pay | Admitting: Nurse Practitioner

## 2021-01-16 NOTE — Telephone Encounter (Signed)
Pts DME company is [employer] , patient aware nothing further needed.

## 2021-01-25 ENCOUNTER — Other Ambulatory Visit: Payer: Self-pay

## 2021-01-28 ENCOUNTER — Encounter: Payer: Medicare HMO | Admitting: Pulmonary Disease

## 2021-01-28 MED ORDER — FLUTICASONE PROPIONATE 50 MCG/ACT NA SUSP: 2.0000 | Freq: Every day | NASAL | 3 refills | Status: DC

## 2021-01-31 ENCOUNTER — Emergency Department (HOSPITAL_COMMUNITY): Payer: Medicare HMO

## 2021-01-31 ENCOUNTER — Ambulatory Visit: Payer: Medicare HMO | Admitting: Nutrition

## 2021-01-31 ENCOUNTER — Emergency Department (HOSPITAL_COMMUNITY)
Admission: EM | Admit: 2021-01-31 | Discharge: 2021-01-31 | Disposition: A | Discharge status: Home / Self Care | Payer: Medicare HMO | Attending: Emergency Medicine | Admitting: Emergency Medicine

## 2021-01-31 ENCOUNTER — Encounter (HOSPITAL_COMMUNITY): Payer: Self-pay | Admitting: *Deleted

## 2021-01-31 ENCOUNTER — Other Ambulatory Visit: Payer: Self-pay

## 2021-01-31 DIAGNOSIS — S4992XA Unspecified injury of left shoulder and upper arm, initial encounter: Secondary | ICD-10-CM | POA: Diagnosis not present

## 2021-01-31 DIAGNOSIS — Z91040 Latex allergy status: Secondary | ICD-10-CM | POA: Diagnosis not present

## 2021-01-31 DIAGNOSIS — S3992XA Unspecified injury of lower back, initial encounter: Secondary | ICD-10-CM | POA: Diagnosis present

## 2021-01-31 DIAGNOSIS — Y92410 Unspecified street and highway as the place of occurrence of the external cause: Secondary | ICD-10-CM | POA: Diagnosis not present

## 2021-01-31 DIAGNOSIS — Z79899 Other long term (current) drug therapy: Secondary | ICD-10-CM | POA: Diagnosis not present

## 2021-01-31 DIAGNOSIS — M545 Low back pain, unspecified: Secondary | ICD-10-CM

## 2021-01-31 MED ORDER — HYDROCODONE-ACETAMINOPHEN 5-325 MG PO TABS
1.0000 | ORAL_TABLET | Freq: Once | ORAL | Status: AC
  Administered 2021-01-31: 1 via ORAL
  Filled 2021-01-31: qty 1

## 2021-01-31 MED ORDER — ACETAMINOPHEN ER 650 MG PO TBCR: 650.0000 mg | EXTENDED_RELEASE_TABLET | Freq: Three times a day (TID) | ORAL | 0 refills | Status: DC | PRN

## 2021-01-31 MED ORDER — METHOCARBAMOL 500 MG PO TABS: 500.0000 mg | ORAL_TABLET | Freq: Two times a day (BID) | ORAL | 0 refills | Status: DC
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
## 2021-02-04 ENCOUNTER — Telehealth: Payer: Self-pay | Admitting: *Deleted
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
## 2021-02-08 ENCOUNTER — Encounter (HOSPITAL_COMMUNITY): Payer: Self-pay | Admitting: Internal Medicine

## 2021-02-11 NOTE — Telephone Encounter (Signed)
Patient aware.  Appt made for 02-13-21.  Patient is unsure if he will be able to find a ride to get here but he will try.  Lyah Millirons,CMA

## 2021-02-11 NOTE — Telephone Encounter (Signed)
Patient left another voicemail requesting an urgent PT referral for evaluation.  He is unable to come into [place] for an appt with [place] at this time.  He is wanting to be referred to a physical therapy location close to him.  Will forward to MD.  Burnard Hawthorne
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
## 2021-02-12 NOTE — Patient Instructions (Signed)
It was wonderful to see you today. ? ?Please bring ALL of your medications with you to every visit.  ? ?Today we talked about: ? ?** ? ? ?Thank you for choosing [facility].  ? ?Please call [phone] with any questions about today's appointment. ? ?Please be sure to schedule follow up at the front  desk before you leave today.  ? ?Mirca Yale, DO ?PGY-2 Family Medicine   ?

## 2021-02-13 ENCOUNTER — Telehealth (INDEPENDENT_AMBULATORY_CARE_PROVIDER_SITE_OTHER): Payer: Medicare HMO | Admitting: Family Medicine

## 2021-02-13 ENCOUNTER — Encounter: Payer: Self-pay | Admitting: Family Medicine

## 2021-02-13 ENCOUNTER — Other Ambulatory Visit: Payer: Self-pay

## 2021-02-13 DIAGNOSIS — M25512 Pain in left shoulder: Secondary | ICD-10-CM | POA: Diagnosis not present

## 2021-02-13 DIAGNOSIS — M5440 Lumbago with sciatica, unspecified side: Secondary | ICD-10-CM

## 2021-02-13 DIAGNOSIS — F4321 Adjustment disorder with depressed mood: Secondary | ICD-10-CM | POA: Insufficient documentation

## 2021-02-13 NOTE — Assessment & Plan Note (Signed)
Reports that he recently lost both his parents. I shared my condolences for his loss. States he is handling things well but did request resources for therapy and counseling. I sent him a MyChart message with resources of therapy and counseling today.
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
## 2021-02-15 ENCOUNTER — Telehealth (INDEPENDENT_AMBULATORY_CARE_PROVIDER_SITE_OTHER): Payer: Medicare HMO | Admitting: Nurse Practitioner

## 2021-02-15 ENCOUNTER — Encounter: Payer: Self-pay | Admitting: Nurse Practitioner

## 2021-02-15 ENCOUNTER — Telehealth: Payer: Self-pay | Admitting: Nurse Practitioner

## 2021-02-15 DIAGNOSIS — J4530 Mild persistent asthma, uncomplicated: Secondary | ICD-10-CM | POA: Diagnosis not present

## 2021-02-15 DIAGNOSIS — G4733 Obstructive sleep apnea (adult) (pediatric): Secondary | ICD-10-CM | POA: Diagnosis not present

## 2021-02-15 NOTE — Assessment & Plan Note (Signed)
Suspect PND likely related to untreated OSA. If problem persists, could consider step up to Symbicort 160. Otherwise, breathing is stable.

## 2021-02-15 NOTE — Patient Instructions (Addendum)
-  Continue albuterol inhaler 2 puffs or 3 mL neb every 6 hours as needed for shortness of breath or wheezing. Notify if symptoms persist despite rescue inhaler/neb use.  -Continue Symbicort 80 2 puffs Twice daily, brush tongue and rinse mouth afterwards -Continue Singulair 10 mg At bedtime    Use CPAP every night, minimum of 6 hours a night.  Change equipment every 30 days or as directed by DME. Wash your tubing with warm soap and water daily, hang to dry. Wash humidifier portion weekly.  Be aware of reduced alertness and do not drive or operate heavy machinery if experiencing this or drowsiness.  Exercise encouraged, as tolerated. Healthy weight management discussed.  Avoid or decrease alcohol consumption and medications that make you more sleepy, if possible. Notify if persistent daytime sleepiness occurs even with consistent use of CPAP.    We discussed how untreated sleep apnea puts an individual at risk for cardiac arrhthymias, pulm HTN, DM, stroke and increases their risk for daytime accidents.    Asthma Action Plan in place Avoid triggers, when able.  Exercise encouraged. Notify if worsening symptoms upon exertion.  Notify and seek help if symptoms unrelieved by rescue inhaler.

## 2021-02-15 NOTE — Telephone Encounter (Signed)
Patient wants to have a video visit. Closing encounter.

## 2021-02-15 NOTE — Telephone Encounter (Signed)
I left a message for the patient and wanted to verify what the patient was coming in the office for this afternoon with the provider.
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
## 2021-02-15 NOTE — Assessment & Plan Note (Addendum)
Discussed the importance of him starting CPAP therapy given his severe OSA and desaturations. He voiced understanding. Contacting [employer] to see if they can deliver machine; otherwise, he will see if someone can pick it up for him. Reached out to [facility] to reschedule CPAP titration for him.   Patient Instructions  -Continue albuterol inhaler 2 puffs or 3 mL neb every 6 hours as needed for shortness of breath or wheezing. Notify if symptoms persist despite rescue inhaler/neb use.  -Continue Symbicort 80 2 puffs Twice daily, brush tongue and rinse mouth afterwards -Continue Singulair 10 mg At bedtime    Use CPAP every night, minimum of 6 hours a night.  Change equipment every 30 days or as directed by DME. Wash your tubing with warm soap and water daily, hang to dry. Wash humidifier portion weekly.  Be aware of reduced alertness and do not drive or operate heavy machinery if experiencing this or drowsiness.  Exercise encouraged, as tolerated. Healthy weight management discussed.  Avoid or decrease alcohol consumption and medications that make you more sleepy, if possible. Notify if persistent daytime sleepiness occurs even with consistent use of CPAP.    We discussed how untreated sleep apnea puts an individual at risk for cardiac arrhthymias, pulm HTN, DM, stroke and increases their risk for daytime accidents.    Asthma Action Plan in place Avoid triggers, when able.  Exercise encouraged. Notify if worsening symptoms upon exertion.  Notify and seek help if symptoms unrelieved by rescue inhaler.

## 2021-02-15 NOTE — Progress Notes (Signed)
Reviewed and agree with assessment/plan. ° ° °Idolina Mantell, MD °[facility] °02/15/2021, 4:52 PM °Pager:  [phone] ° °

## 2021-02-17 ENCOUNTER — Telehealth: Payer: Medicare HMO | Admitting: Physician Assistant

## 2021-02-17 DIAGNOSIS — J208 Acute bronchitis due to other specified organisms: Secondary | ICD-10-CM

## 2021-02-17 MED ORDER — PREDNISONE 20 MG PO TABS: 40.0000 mg | ORAL_TABLET | Freq: Every day | ORAL | 0 refills | Status: DC

## 2021-02-17 MED ORDER — PROMETHAZINE-DM 6.25-15 MG/5ML PO SYRP: 5.0000 mL | ORAL_SOLUTION | Freq: Four times a day (QID) | ORAL | 0 refills | Status: DC | PRN

## 2021-02-17 NOTE — Patient Instructions (Signed)
Jordan Jensen, thank you for joining Margaretann Loveless, PA-C for today's virtual visit.  While this provider is not your primary care provider (PCP), if your PCP is located in our provider database this encounter information will be shared with them immediately following your visit.  Consent: (Patient) Jordan Jensen provided verbal consent for this virtual visit at the beginning of the encounter.  Current Medications:  Current Outpatient Medications:    predniSONE (DELTASONE) 20 MG tablet, Take 2 tablets (40 mg total) by mouth daily with breakfast., Disp: 14 tablet, Rfl: 0   promethazine-dextromethorphan (PROMETHAZINE-DM) 6.25-15 MG/5ML syrup, Take 5 mLs by mouth 4 (four) times daily as needed., Disp: 118 mL, Rfl: 0   acetaminophen (TYLENOL 8 HOUR) 650 MG CR tablet, Take 1 tablet (650 mg total) by mouth every 8 (eight) hours as needed for pain., Disp: 15 tablet, Rfl: 0   acetaminophen (TYLENOL) 325 MG tablet, Take 2 tablets (650 mg total) by mouth every 6 (six) hours as needed for mild pain (or Fever >/= 101)., Disp: , Rfl:    albuterol (PROVENTIL) (2.5 MG/3ML) 0.083% nebulizer solution, Take 3 mLs (2.5 mg total) by nebulization every 6 (six) hours as needed for wheezing or shortness of breath., Disp: 360 mL, Rfl: 5   albuterol (VENTOLIN HFA) 108 (90 Base) MCG/ACT inhaler, Inhale 2 puffs into the lungs every 6 (six) hours as needed for wheezing or shortness of breath., Disp: 1 each, Rfl: 5   budesonide-formoterol (SYMBICORT) 80-4.5 MCG/ACT inhaler, Inhale 2 puffs into the lungs in the morning and at bedtime. Brush tongue and rinse mouth afterwards, Disp: 1 each, Rfl: 3   fluticasone (FLONASE) 50 MCG/ACT nasal spray, Place 2 sprays into both nostrils daily., Disp: 16 g, Rfl: 3   furosemide (LASIX) 40 MG tablet, Take 40 mg by mouth daily., Disp: , Rfl:    methocarbamol (ROBAXIN) 500 MG tablet, Take 1 tablet (500 mg total) by mouth 2 (two) times daily., Disp: 20 tablet, Rfl: 0   metoprolol  succinate (TOPROL XL) 25 MG 24 hr tablet, Take 1 tablet (25 mg total) by mouth daily., Disp: 90 tablet, Rfl: 3   montelukast (SINGULAIR) 10 MG tablet, Take 10 mg by mouth daily., Disp: , Rfl:    naproxen (NAPROSYN) 500 MG tablet, Take 1 tablet (500 mg total) by mouth 2 (two) times daily with a meal. (Patient taking differently: Take 500 mg by mouth daily as needed for mild pain.), Disp: 30 tablet, Rfl: 0   rosuvastatin (CRESTOR) 10 MG tablet, Take 1 tablet (10 mg total) by mouth daily., Disp: 90 tablet, Rfl: 3   sacubitril-valsartan (ENTRESTO) 24-26 MG, Take 1 tablet by mouth 2 (two) times daily., Disp: 60 tablet, Rfl: 6   Medications ordered in this encounter:  Meds ordered this encounter  Medications   predniSONE (DELTASONE) 20 MG tablet    Sig: Take 2 tablets (40 mg total) by mouth daily with breakfast.    Dispense:  14 tablet    Refill:  0    Order Specific Question:   Supervising Provider    Answer:   Eber Hong [[id number]]   promethazine-dextromethorphan (PROMETHAZINE-DM) 6.25-15 MG/5ML syrup    Sig: Take 5 mLs by mouth 4 (four) times daily as needed.    Dispense:  118 mL    Refill:  0    Order Specific Question:   Supervising Provider    Answer:   Hyacinth Meeker, BRIAN [[id number]]     *If you need refills on other medications prior  to your next appointment, please contact your pharmacy*  Follow-Up: Call back or seek an in-person evaluation if the symptoms worsen or if the condition fails to improve as anticipated.  Other Instructions Cough, Adult Coughing is a reflex that clears your throat and your airways (respiratory system). Coughing helps to heal and protect your lungs. It is normal to cough occasionally, but a cough that happens with other symptoms or lasts a long time may be a sign of a condition that needs treatment. An acute cough may only last 2-3 weeks, while a chronic cough may last 8 or more weeks. Coughing is commonly caused by: Infection of the respiratory systemby viruses or  bacteria. Breathing in substances that irritate your lungs. Allergies. Asthma. Mucus that runs down the back of your throat (postnasal drip). Smoking. Acid backing up from the stomach into the esophagus (gastroesophageal reflux). Certain medicines. Chronic lung problems. Other medical conditions such as heart failure or a blood clot in the lung (pulmonary embolism). Follow these instructions at home: Medicines Take over-the-counter and prescription medicines only as told by your health care provider. Talk with your health care provider before you take a cough suppressant medicine. Lifestyle  Avoid cigarette smoke. Do not use any products that contain nicotine or tobacco, such as cigarettes, e-cigarettes, and chewing tobacco. If you need help quitting, ask your health care provider. Drink enough fluid to keep your urine pale yellow. Avoid caffeine. Do not drink alcohol if your health care provider tells you not to drink. General instructions  Pay close attention to changes in your cough. Tell your health care provider about them. Always cover your mouth when you cough. Avoid things that make you cough, such as perfume, candles, cleaning products, or campfire or tobacco smoke. If the air is dry, use a cool mist vaporizer or humidifier in your bedroom or your home to help loosen secretions. If your cough is worse at night, try to sleep in a semi-upright position. Rest as needed. Keep all follow-up visits as told by your health care provider. This is important. Contact a health care provider if you: Have new symptoms. Cough up pus. Have a cough that does not get better after 2-3 weeks or gets worse. Cannot control your cough with cough suppressant medicines and you are losing sleep. Have pain that gets worse or pain that is not helped with medicine. Have a fever. Have unexplained weight loss. Have night sweats. Get help right away if: You cough up blood. You have difficulty  breathing. Your heartbeat is very fast. These symptoms may represent a serious problem that is an emergency. Do not wait to see if the symptoms will go away. Get medical help right away. Call your local emergency services (911 in the [place] Do not drive yourself to the hospital. Summary Coughing is a reflex that clears your throat and your airways. It is normal to cough occasionally, but a cough that happens with other symptoms or lasts a long time may be a sign of a condition that needs treatment. Take over-the-counter and prescription medicines only as told by your health care provider. Always cover your mouth when you cough. Contact a health care provider if you have new symptoms or a cough that does not get better after 2-3 weeks or gets worse. This information is not intended to replace advice given to you by your health care provider. Make sure you discuss any questions you have with your health care provider. Document Revised: 01/11/2018 Document Reviewed: 01/11/2018 Elsevier  Patient Education  2022 [employer].    If you have been instructed to have an in-person evaluation today at a local Urgent Care facility, please use the link below. It will take you to a list of all of our available [facility], including address, phone number and hours of operation. Please do not delay care.  [facility]  If you or a family member do not have a primary care provider, use the link below to schedule a visit and establish care. When you choose a [facility] primary care physician or advanced practice provider, you gain a long-term partner in health. Find a Primary Care Provider  Learn more about [facility]'s in-office and virtual care options:  - Get Care Now
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
## 2021-02-18 ENCOUNTER — Telehealth: Payer: Self-pay | Admitting: *Deleted

## 2021-02-18 NOTE — Telephone Encounter (Signed)
Jordan Jensen,  This patient did not answer phone for PV today his colonoscopy is scheduled for [facility] on 3/13.

## 2021-02-18 NOTE — Telephone Encounter (Signed)
Patient no show/answer PV appointment for today. I called patient x3, no answer, left a message for the patient to call us back today before 5 pm or the PV and procedure will be cancelled. ? ?

## 2021-02-18 NOTE — Telephone Encounter (Signed)
Patient called back and rescheduled PV. ?

## 2021-02-18 NOTE — Telephone Encounter (Signed)
Called patient again, no answer, left message to return our call before 5 pm today.

## 2021-02-19 ENCOUNTER — Other Ambulatory Visit: Payer: Self-pay

## 2021-02-19 ENCOUNTER — Ambulatory Visit (AMBULATORY_SURGERY_CENTER): Payer: Medicare HMO

## 2021-02-19 VITALS — Ht 71.0 in | Wt >= 6400 oz

## 2021-02-19 DIAGNOSIS — Z1211 Encounter for screening for malignant neoplasm of colon: Secondary | ICD-10-CM

## 2021-02-19 MED ORDER — NA SULFATE-K SULFATE-MG SULF 17.5-3.13-1.6 GM/177ML PO SOLN: 1.0000 | Freq: Once | ORAL | 0 refills | Status: AC

## 2021-02-19 NOTE — Progress Notes (Signed)
° ° °  Patient's pre-visit was done today over the phone with the patient   Name,DOB and address verified.    Patient denies any allergies to Eggs and Soy.   Patient denies any problems with anesthesia/sedation.  Patient denies taking diet pills or blood thinners.   Denies atrial flutter or atrial fib  Denies chronic constipation  No home Oxygen.   Packet of Prep instructions sent by My Chart or mail to patient including a copy of a consent form if by mail-pt is aware.   Patient understands to call us back with any questions or concerns.   Patient is aware of our care-partner policy and 0000000 safety protocol.   Pt states he can lay on left side.  Has hx of w/c and crutches needed.  Pt states he can transfer to stretch alone or with some help.

## 2021-02-25 ENCOUNTER — Telehealth: Payer: Self-pay | Admitting: Internal Medicine

## 2021-02-25 DIAGNOSIS — Z1211 Encounter for screening for malignant neoplasm of colon: Secondary | ICD-10-CM

## 2021-02-25 MED ORDER — PEG 3350-KCL-NA BICARB-NACL 420 G PO SOLR: 4000.0000 mL | Freq: Once | ORAL | 0 refills | Status: AC

## 2021-02-25 NOTE — Telephone Encounter (Signed)
Marchelle Folks from [facility] called stating patient's insurance does not pay for the generic Suprep and asked if she can substitute Clenpiq instead.  Please call her at [phone] and advise.  Thank you.

## 2021-02-25 NOTE — Telephone Encounter (Signed)
Per Mollie [place] suprep is not covered by Medicaid or human[employer] medicare.    Switched prep to Golytely-new prep instructions sent to pt's Mychart and new RX sent to [facility]. Called patient and left message about this and asked him to call us tomorrow so we can go over this prep with him.

## 2021-02-26 NOTE — Telephone Encounter (Signed)
Pt seen on 1/23

## 2021-03-04 ENCOUNTER — Ambulatory Visit: Payer: Medicare HMO | Admitting: Nutrition

## 2021-03-04 NOTE — Progress Notes (Deleted)
Patient wasn't able to get out of the car to come in the office for his appointment. Will r/s appt to be a mychart visit. Discussed this with patient and his wife in the parking lot. He is very weak in his legs and can't get out of the car and into wheelchair. He verbalized understanding.

## 2021-03-13 ENCOUNTER — Encounter (HOSPITAL_COMMUNITY): Payer: Self-pay | Admitting: Physical Therapy

## 2021-03-13 ENCOUNTER — Other Ambulatory Visit: Payer: Self-pay

## 2021-03-13 ENCOUNTER — Ambulatory Visit (HOSPITAL_COMMUNITY): Payer: Medicare HMO | Attending: Family Medicine | Admitting: Physical Therapy

## 2021-03-13 DIAGNOSIS — M25512 Pain in left shoulder: Secondary | ICD-10-CM | POA: Diagnosis not present

## 2021-03-13 DIAGNOSIS — M6281 Muscle weakness (generalized): Secondary | ICD-10-CM | POA: Insufficient documentation

## 2021-03-13 DIAGNOSIS — M5440 Lumbago with sciatica, unspecified side: Secondary | ICD-10-CM | POA: Diagnosis not present

## 2021-03-13 DIAGNOSIS — M545 Low back pain, unspecified: Secondary | ICD-10-CM | POA: Insufficient documentation

## 2021-03-13 NOTE — Patient Instructions (Signed)
Access Code: [email or web address] ?URL: [email or web address] ?Date: 03/13/2021 ?Prepared by: Georges Lynch ? ?Exercises ?Seated Scapular Retraction - 3 x daily - 7 x weekly - 2 sets - 10 reps - 5 second hold ?Shoulder External Rotation and Scapular Retraction with Resistance - 3 x daily - 7 x weekly - 2 sets - 10 reps ?Standing Shoulder Row with Anchored Resistance - 3 x daily - 7 x weekly - 2 sets - 10 reps ? ?
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
## 2021-03-13 NOTE — Addendum Note (Signed)
Addended by: Georges Lynch A on: 03/13/2021 04:55 PM ? ? Modules accepted: Orders ? ?

## 2021-03-14 ENCOUNTER — Encounter: Payer: Medicare HMO | Attending: Nurse Practitioner | Admitting: Nutrition

## 2021-03-14 DIAGNOSIS — R262 Difficulty in walking, not elsewhere classified: Secondary | ICD-10-CM | POA: Insufficient documentation

## 2021-03-14 DIAGNOSIS — I10 Essential (primary) hypertension: Secondary | ICD-10-CM | POA: Insufficient documentation

## 2021-03-14 DIAGNOSIS — Z6841 Body Mass Index (BMI) 40.0 and over, adult: Secondary | ICD-10-CM | POA: Insufficient documentation

## 2021-03-14 DIAGNOSIS — Z713 Dietary counseling and surveillance: Secondary | ICD-10-CM | POA: Insufficient documentation

## 2021-03-14 NOTE — Progress Notes (Incomplete)
Medical Nutrition Therapy  Appointment Start time:  [phone]  Appointment End time:  1400  Primary concerns today: ***  Referral diagnosis: *** Preferred learning style: *** (auditory, visual, hands on, no preference indicated) Learning readiness: *** (not ready, contemplating, ready, change in progress)  This visit was completed via telephone due to the COVID-19 pandemic.   I spoke with *** and verified that I was speaking with the correct person with two patient identifiers (full name and date of birth).   I discussed the limitations related to this kind of visit and the patient is willing to proceed.   NUTRITION ASSESSMENT  Has cut down on mro dnki Anthropometrics  ***   Clinical Medical Hx: *** Medications: *** Labs: *** Notable Signs/Symptoms: ***  Lifestyle & Dietary Hx LIvn and wife. Wife does the chosppinges in  Estimated daily fluid intake: 30 oz Supplements: *** Sleep: 5 Stress / self-care: *** Current average weekly physical activity: *** Cpap machine. 24-Hr Dietary Recall First Meal: 1 cheese toast Snack: ***esttl Snack: eonad Third Meal: *** Snack: microwave popcorn Beverages: ***  Estimated Energy Needs Calories: *** Carbohydrate: ***g Protein: ***g Fat: ***g   NUTRITION DIAGNOSIS  {CHL AMB NUTRITIONAL [phone]}   NUTRITION INTERVENTION  Nutrition education (E-1) on the following topics:  ***  Handouts Provided Include  ***  Learning Style & Readiness for Change Teaching method utilized: Visual & Auditory  Demonstrated degree of understanding via: Teach Back  Barriers to learning/adherence to lifestyle change: ***  Goals Established by Pt ***   MONITORING & EVALUATION Dietary intake, weekly physical activity, and *** in ***.  Next Steps  Patient is to ***.
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
## 2021-03-15 ENCOUNTER — Other Ambulatory Visit: Payer: Self-pay

## 2021-03-15 ENCOUNTER — Encounter: Payer: Self-pay | Admitting: Student

## 2021-03-15 ENCOUNTER — Ambulatory Visit (INDEPENDENT_AMBULATORY_CARE_PROVIDER_SITE_OTHER): Payer: Medicare HMO | Admitting: Student

## 2021-03-15 VITALS — BP 130/76 | HR 86 | Ht 71.0 in

## 2021-03-15 DIAGNOSIS — I10 Essential (primary) hypertension: Secondary | ICD-10-CM

## 2021-03-15 DIAGNOSIS — I890 Lymphedema, not elsewhere classified: Secondary | ICD-10-CM

## 2021-03-15 DIAGNOSIS — G4733 Obstructive sleep apnea (adult) (pediatric): Secondary | ICD-10-CM | POA: Diagnosis not present

## 2021-03-15 DIAGNOSIS — I5022 Chronic systolic (congestive) heart failure: Secondary | ICD-10-CM

## 2021-03-15 NOTE — Patient Instructions (Addendum)
Medication Instructions:  ? ?Take your Entresto Two Times Daily  ?Report Blood Pressure Readings in 2-3 weeks via phone or MyChart  ? ?*If you need a refill on your cardiac medications before your next appointment, please call your pharmacy* ? ? ?Lab Work: ?NONE  ? ?If you have labs (blood work) drawn today and your tests are completely normal, you will receive your results only by: ?MyChart Message (if you have MyChart) OR ?A paper copy in the mail ?If you have any lab test that is abnormal or we need to change your treatment, we will call you to review the results. ? ? ?Testing/Procedures: ?NONE  ? ? ?Follow-Up: ?At [facility], you and your health needs are our priority.  As part of our continuing mission to provide you with exceptional heart care, we have created designated Provider Care Teams.  These Care Teams include your primary Cardiologist (physician) and Advanced Practice Providers (APPs -  Physician Assistants and Nurse Practitioners) who all work together to provide you with the care you need, when you need it. ? ?We recommend signing up for the patient portal called "MyChart".  Sign up information is provided on this After Visit Summary.  MyChart is used to connect with patients for Virtual Visits (Telemedicine).  Patients are able to view lab/test results, encounter notes, upcoming appointments, etc.  Non-urgent messages can be sent to your provider as well.   ?To learn more about what you can do with MyChart, go to [email or web address].   ? ?Your next appointment:   ?2-3 month(s) ? ?The format for your next appointment:   ?In Person ? ?Provider:   ?Randall An, PA-C  ? ? ?Other Instructions ?Thank you for choosing [facility]! ?  ? ? ?

## 2021-03-17 ENCOUNTER — Telehealth: Payer: Medicare HMO | Admitting: Physician Assistant

## 2021-03-17 DIAGNOSIS — R051 Acute cough: Secondary | ICD-10-CM
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
## 2021-03-18 ENCOUNTER — Telehealth: Payer: Self-pay

## 2021-03-18 ENCOUNTER — Encounter (HOSPITAL_COMMUNITY): Payer: Self-pay | Admitting: Anesthesiology

## 2021-03-18 ENCOUNTER — Ambulatory Visit (HOSPITAL_COMMUNITY): Admission: RE | Admit: 2021-03-18 | Payer: Medicare HMO | Source: Home / Self Care | Admitting: Internal Medicine

## 2021-03-18 ENCOUNTER — Encounter (HOSPITAL_COMMUNITY): Payer: Self-pay | Admitting: Internal Medicine

## 2021-03-18 SURGERY — COLONOSCOPY WITH PROPOFOL
Anesthesia: Monitor Anesthesia Care

## 2021-03-18 NOTE — Telephone Encounter (Signed)
Called and spoke with pt to set up a f/u appt per Dr. Hilarie Fredrickson. Patient has been scheduled for a f/u appt with Dr. Candis Schatz on Thursday, 04/04/21 at 3:40 pm. Pt verbalized understanding and had no concerns at the end of the call. ?

## 2021-03-19 ENCOUNTER — Ambulatory Visit: Payer: Medicare HMO | Admitting: Nurse Practitioner

## 2021-03-21 ENCOUNTER — Encounter (HOSPITAL_COMMUNITY): Payer: Self-pay

## 2021-03-21 ENCOUNTER — Other Ambulatory Visit: Payer: Self-pay

## 2021-03-21 ENCOUNTER — Ambulatory Visit (HOSPITAL_COMMUNITY): Payer: Medicare HMO

## 2021-03-21 DIAGNOSIS — M25512 Pain in left shoulder: Secondary | ICD-10-CM | POA: Diagnosis not present

## 2021-03-21 NOTE — Patient Instructions (Signed)
Neck: Retraction ? ? ? ?Sit with back and head straight. Pull chin back to line up ear with shoulder. Do not turn or tilt head. ?May assist if child cannot keep correct position. ?Hold 3 seconds. Repeat 10 times. Do 2 sessions per day. ?CAUTION: Movement should be gentle, steady and slow. ? ?Copyright ? VHI. All rights reserved.  ? ?Isometric Abdominal Contraction ? ? ? ?Tuck in stomach muscles and push low back against back of chair.  ?Hold 5 seconds while counting out loud. ?Repeat 10 times. Do 2 sessions per day. ? ?http://[email or web address].exer.us/623  ? ?Copyright ? VHI. All rights reserved.  ? ? ?
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
## 2021-03-22 ENCOUNTER — Encounter (HOSPITAL_COMMUNITY): Payer: Self-pay | Admitting: Physical Therapy

## 2021-03-22 ENCOUNTER — Ambulatory Visit (HOSPITAL_COMMUNITY): Payer: Medicare HMO | Admitting: Physical Therapy

## 2021-03-22 DIAGNOSIS — M25512 Pain in left shoulder: Secondary | ICD-10-CM | POA: Diagnosis not present

## 2021-03-22 DIAGNOSIS — M545 Low back pain, unspecified: Secondary | ICD-10-CM

## 2021-03-22 DIAGNOSIS — M6281 Muscle weakness (generalized): Secondary | ICD-10-CM
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
## 2021-03-25 ENCOUNTER — Telehealth: Payer: Self-pay

## 2021-03-25 ENCOUNTER — Ambulatory Visit (HOSPITAL_COMMUNITY): Payer: Medicare HMO | Admitting: Physical Therapy

## 2021-03-25 ENCOUNTER — Other Ambulatory Visit: Payer: Self-pay

## 2021-03-25 DIAGNOSIS — M545 Low back pain, unspecified: Secondary | ICD-10-CM

## 2021-03-25 DIAGNOSIS — M6281 Muscle weakness (generalized): Secondary | ICD-10-CM

## 2021-03-25 DIAGNOSIS — M25512 Pain in left shoulder: Secondary | ICD-10-CM | POA: Diagnosis not present

## 2021-03-25 NOTE — Telephone Encounter (Signed)
-----   Message from Jenel Lucks, MD sent at 03/24/2021 10:24 AM EDT ----- ?Thanks for keeping me in the loop.  Agree with following up in clinic. ? ?----- Message ----- ?From: Beverley Fiedler, MD ?Sent: 03/18/2021   7:54 AM EDT ?To: Annett Fabian, RN, Chrystie Nose, RN, # ? ?See this exchange on pt scheduled for colonoscopy with me at [facility] this morning.  Procedure has been canceled. ? ?[name], PAC: Dr. Christella Hartigan -- I'm messaging you because you are on call today. I just did a virtual visit for a patient that is supposed to undergo Colonoscopy with Pyrtle tomorrow. Patient is having cough, cannot lay down flat without coughing. Has not done COVID test. I went ahead and told him to NOT do his prep tonight and that I would reach out to you guys to let you know what's going on so it can be rescheduled. IF for any reason you do not agree with my recommendation and want him to complete the prep for tonight, I'd be happy to call him back and give him your recommendations. THANKS! ? ?Wendall Papa, MD: got it, thanks. Definitely agree that he should not go ahead with the colonoscopy tomorrow.  I'll forward them to Dr. [name] as well.  ? ?Erick Blinks, MD: [name] patient seen for + Cologuard, see note from [facility].  His colonoscopy is canceled for today for medical reasons as outlined.  Please get patient back in to see Dr. [name] to make decision to determine if ever appropriate for outpt hospital procedure. ? ?Thanks ?JMP ? ? ?
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
## 2021-03-25 NOTE — Telephone Encounter (Signed)
Pt scheduled to see Dr. [name] 04/08/21 at 11:10am. Appt letter mailed to pt. ?

## 2021-03-28 ENCOUNTER — Encounter (HOSPITAL_COMMUNITY): Payer: Self-pay

## 2021-03-28 ENCOUNTER — Other Ambulatory Visit: Payer: Self-pay

## 2021-03-28 ENCOUNTER — Ambulatory Visit (HOSPITAL_COMMUNITY): Payer: Medicare HMO

## 2021-03-28 DIAGNOSIS — M545 Low back pain, unspecified: Secondary | ICD-10-CM

## 2021-03-28 DIAGNOSIS — M25512 Pain in left shoulder: Secondary | ICD-10-CM

## 2021-03-28 DIAGNOSIS — M6281 Muscle weakness (generalized): Secondary | ICD-10-CM
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
## 2021-04-01 ENCOUNTER — Other Ambulatory Visit: Payer: Self-pay

## 2021-04-01 ENCOUNTER — Ambulatory Visit (HOSPITAL_COMMUNITY): Payer: Medicare HMO

## 2021-04-01 DIAGNOSIS — M25512 Pain in left shoulder: Secondary | ICD-10-CM

## 2021-04-01 DIAGNOSIS — M6281 Muscle weakness (generalized): Secondary | ICD-10-CM

## 2021-04-01 DIAGNOSIS — M545 Low back pain, unspecified: Secondary | ICD-10-CM
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
## 2021-04-02 ENCOUNTER — Encounter: Payer: Self-pay | Admitting: Nutrition

## 2021-04-02 NOTE — Patient Instructions (Signed)
Goals ? ?Eat 3 meals at times we discussed. ?Focus on more whole food plant based foods ?Drink only water and cut out crystal light and flavored beverages ?Increase high fiber foods ?Avoid snacks between meals ?Don't eat past 7 pm. ?Don't skip meals. ?Lose 5 lbs per month ?Try to do bed or chair exercises 15 minutes per day ?Walk in the house as much as possible. ? ?

## 2021-04-03 ENCOUNTER — Ambulatory Visit (HOSPITAL_COMMUNITY): Payer: Medicare HMO | Admitting: Physical Therapy

## 2021-04-03 ENCOUNTER — Encounter (HOSPITAL_COMMUNITY): Payer: Self-pay | Admitting: Physical Therapy

## 2021-04-03 DIAGNOSIS — M545 Low back pain, unspecified: Secondary | ICD-10-CM

## 2021-04-03 DIAGNOSIS — M25512 Pain in left shoulder: Secondary | ICD-10-CM

## 2021-04-03 DIAGNOSIS — M6281 Muscle weakness (generalized): Secondary | ICD-10-CM
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
## 2021-04-04 ENCOUNTER — Ambulatory Visit: Payer: Medicare HMO | Admitting: Gastroenterology

## 2021-04-08 ENCOUNTER — Encounter: Payer: Self-pay | Admitting: Gastroenterology

## 2021-04-08 ENCOUNTER — Ambulatory Visit (INDEPENDENT_AMBULATORY_CARE_PROVIDER_SITE_OTHER): Payer: Medicare HMO | Admitting: Gastroenterology

## 2021-04-08 VITALS — BP 118/80 | HR 76

## 2021-04-08 DIAGNOSIS — R6889 Other general symptoms and signs: Secondary | ICD-10-CM | POA: Diagnosis not present

## 2021-04-08 DIAGNOSIS — R195 Other fecal abnormalities: Secondary | ICD-10-CM
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
## 2021-04-08 NOTE — Patient Instructions (Signed)
If you are age 52 or older, your body mass index should be between 23-30. Your There is no height or weight on file to calculate BMI. If this is out of the aforementioned range listed, please consider follow up with your Primary Care Provider. ? ?If you are age 49 or younger, your body mass index should be between 19-25. Your There is no height or weight on file to calculate BMI. If this is out of the aformentioned range listed, please consider follow up with your Primary Care Provider.  ? ?We will call you when the next hospital date becomes available. ? ?The [facility] providers would like to encourage you to use [facility] to communicate with providers for non-urgent requests or questions.  Due to long hold times on the telephone, sending your provider a message by [facility] may be a faster and more efficient way to get a response.  Please allow 48 business hours for a response.  Please remember that this is for non-urgent requests.  ? ?It was a pleasure to see you today! ? ?Thank you for trusting me with your gastrointestinal care!   ? ?Scott E.[name], MD ? ?

## 2021-04-09 ENCOUNTER — Encounter (HOSPITAL_COMMUNITY): Payer: Medicare HMO | Admitting: Physical Therapy

## 2021-04-09 ENCOUNTER — Telehealth (HOSPITAL_COMMUNITY): Payer: Self-pay | Admitting: Physical Therapy

## 2021-04-11 ENCOUNTER — Ambulatory Visit (HOSPITAL_COMMUNITY): Payer: Medicare HMO | Attending: Family Medicine | Admitting: Physical Therapy

## 2021-04-11 ENCOUNTER — Encounter (HOSPITAL_COMMUNITY): Payer: Self-pay | Admitting: Physical Therapy

## 2021-04-11 DIAGNOSIS — R6889 Other general symptoms and signs: Secondary | ICD-10-CM | POA: Diagnosis not present

## 2021-04-11 DIAGNOSIS — M25512 Pain in left shoulder: Secondary | ICD-10-CM | POA: Diagnosis not present

## 2021-04-11 DIAGNOSIS — M545 Low back pain, unspecified: Secondary | ICD-10-CM | POA: Diagnosis not present

## 2021-04-11 DIAGNOSIS — M6281 Muscle weakness (generalized): Secondary | ICD-10-CM | POA: Insufficient documentation
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
## 2021-04-16 ENCOUNTER — Ambulatory Visit (HOSPITAL_COMMUNITY): Payer: Medicare HMO

## 2021-04-16 DIAGNOSIS — R6889 Other general symptoms and signs: Secondary | ICD-10-CM | POA: Diagnosis not present

## 2021-04-16 DIAGNOSIS — M545 Low back pain, unspecified: Secondary | ICD-10-CM

## 2021-04-16 DIAGNOSIS — M6281 Muscle weakness (generalized): Secondary | ICD-10-CM | POA: Diagnosis not present

## 2021-04-16 DIAGNOSIS — M25512 Pain in left shoulder: Secondary | ICD-10-CM
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
## 2021-04-18 ENCOUNTER — Ambulatory Visit (HOSPITAL_COMMUNITY): Payer: Medicare HMO | Admitting: Physical Therapy

## 2021-04-18 DIAGNOSIS — M545 Low back pain, unspecified: Secondary | ICD-10-CM

## 2021-04-18 DIAGNOSIS — M25512 Pain in left shoulder: Secondary | ICD-10-CM | POA: Diagnosis not present

## 2021-04-18 DIAGNOSIS — M6281 Muscle weakness (generalized): Secondary | ICD-10-CM

## 2021-04-18 DIAGNOSIS — R6889 Other general symptoms and signs: Secondary | ICD-10-CM | POA: Diagnosis not present
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
## 2021-04-23 ENCOUNTER — Encounter (HOSPITAL_COMMUNITY): Payer: Medicare HMO | Admitting: Physical Therapy

## 2021-04-25 ENCOUNTER — Ambulatory Visit (HOSPITAL_COMMUNITY): Payer: Medicare HMO

## 2021-04-25 DIAGNOSIS — M6281 Muscle weakness (generalized): Secondary | ICD-10-CM

## 2021-04-25 DIAGNOSIS — M25512 Pain in left shoulder: Secondary | ICD-10-CM

## 2021-04-25 DIAGNOSIS — M545 Low back pain, unspecified: Secondary | ICD-10-CM | POA: Diagnosis not present

## 2021-04-25 DIAGNOSIS — R6889 Other general symptoms and signs: Secondary | ICD-10-CM | POA: Diagnosis not present
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
## 2021-05-07 NOTE — Telephone Encounter (Signed)
Pt stated he wasn't feeling well and requested to cancel appt. ?

## 2021-05-17 ENCOUNTER — Encounter: Payer: Self-pay | Admitting: Nurse Practitioner

## 2021-05-17 ENCOUNTER — Ambulatory Visit (INDEPENDENT_AMBULATORY_CARE_PROVIDER_SITE_OTHER): Payer: Medicare HMO | Admitting: Nurse Practitioner

## 2021-05-17 VITALS — BP 118/68 | HR 88 | Ht 71.0 in | Wt >= 6400 oz

## 2021-05-17 DIAGNOSIS — R195 Other fecal abnormalities: Secondary | ICD-10-CM | POA: Insufficient documentation

## 2021-05-17 DIAGNOSIS — Z139 Encounter for screening, unspecified: Secondary | ICD-10-CM | POA: Diagnosis not present

## 2021-05-17 DIAGNOSIS — R29898 Other symptoms and signs involving the musculoskeletal system: Secondary | ICD-10-CM

## 2021-05-17 DIAGNOSIS — I10 Essential (primary) hypertension: Secondary | ICD-10-CM | POA: Diagnosis not present

## 2021-05-17 DIAGNOSIS — E785 Hyperlipidemia, unspecified: Secondary | ICD-10-CM

## 2021-05-17 DIAGNOSIS — M25512 Pain in left shoulder: Secondary | ICD-10-CM

## 2021-05-17 DIAGNOSIS — G8929 Other chronic pain: Secondary | ICD-10-CM | POA: Insufficient documentation

## 2021-05-17 DIAGNOSIS — R6889 Other general symptoms and signs: Secondary | ICD-10-CM | POA: Diagnosis not present

## 2021-05-17 MED ORDER — DICLOFENAC SODIUM 1 % EX GEL: 2.0000 g | Freq: Four times a day (QID) | CUTANEOUS | 1 refills | Status: DC

## 2021-05-17 NOTE — Assessment & Plan Note (Addendum)
?  Last lipids ?Lab Results  ?Component Value Date  ? CHOL 166 06/19/2020  ? HDL 36 (L) 06/19/2020  ? LDLCALC 104 (H) 06/19/2020  ? LDLDIRECT 72 07/23/2020  ? TRIG 149 06/19/2020  ? CHOLHDL 4.6 06/19/2020  ?On rosuvastatin 10 mg daily ?Lipid panel ordered ?

## 2021-05-17 NOTE — Assessment & Plan Note (Signed)
X-ray of left shoulder done in January 23 shows no acute fracture or dislocation of the left shoulder. ?2. Moderate acromioclavicular and mild glenohumeral degenerative Change. ? ?No need to repeat an x-ray at this time, patient's pain mostly likely  due to the degenerative changes noted as above. ?Continue Tylenol as needed ?Voltaren gel 2 grams up to 4 times daily as needed ordered ?? ?

## 2021-05-17 NOTE — Patient Instructions (Addendum)
Please apply Voltaren gel, 2 g to left shoulder  up to 4 times daily as needed for pain.  ? ? ? ?It is important that you exercise regularly at least 30 minutes 5 times a week ly as tolerated.  ?Think about what you will eat, plan ahead. ?Choose " clean, green, fresh or frozen" over canned, processed or packaged foods which are more sugary, salty and fatty. ?70 to 75% of food eaten should be vegetables and fruit. ?Three meals at set times with snacks allowed between meals, but they must be fruit or vegetables. ?Aim to eat over a 12 hour period , example 7 am to 7 pm, and STOP after  your last meal of the day. ?Drink water,generally about 64 ounces per day, no other drink is as healthy. Fruit juice is best enjoyed in a healthy way, by EATING the fruit. ? ?Thanks for choosing [facility], we consider it a privelige to serve you.  ?
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
## 2021-05-17 NOTE — Assessment & Plan Note (Addendum)
Needs diagnostic colonoscopy after having a positive Cologuard test. ?Would like to referral to a GI specialist in [place] area instead of going to [place]. ?Referral placed today.  I discussed with the patient that the referral might not be granted due to being established with a GI specialist already. ?
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
## 2021-05-17 NOTE — Assessment & Plan Note (Signed)
BP Readings from Last 3 Encounters:  ?05/17/21 118/68  ?04/08/21 118/80  ?03/15/21 130/76  ?Blood pressure well controlled on Entresto 24-26 mg 1 tablet twice daily, metoprolol 25 mg daily, Lasix 40 mg daily for heart failure. ?DASH diet advised, exercise regularly as tolerated ?Followed by cardiology ?

## 2021-05-30 DIAGNOSIS — R195 Other fecal abnormalities: Secondary | ICD-10-CM | POA: Diagnosis not present

## 2021-05-30 DIAGNOSIS — I10 Essential (primary) hypertension: Secondary | ICD-10-CM | POA: Diagnosis not present

## 2021-05-30 DIAGNOSIS — R6889 Other general symptoms and signs: Secondary | ICD-10-CM | POA: Diagnosis not present

## 2021-05-30 DIAGNOSIS — Z139 Encounter for screening, unspecified: Secondary | ICD-10-CM | POA: Diagnosis not present

## 2021-05-30 DIAGNOSIS — E785 Hyperlipidemia, unspecified: Secondary | ICD-10-CM | POA: Diagnosis not present

## 2021-05-30 DIAGNOSIS — Z131 Encounter for screening for diabetes mellitus: Secondary | ICD-10-CM | POA: Diagnosis not present

## 2021-05-31 ENCOUNTER — Other Ambulatory Visit: Payer: Self-pay | Admitting: Nurse Practitioner

## 2021-05-31 DIAGNOSIS — D72829 Elevated white blood cell count, unspecified: Secondary | ICD-10-CM

## 2021-05-31 DIAGNOSIS — E785 Hyperlipidemia, unspecified: Secondary | ICD-10-CM

## 2021-05-31 LAB — CBC
Hematocrit: 42 % (ref 37.5–51.0)
Hemoglobin: 14.2 g/dL (ref 13.0–17.7)
MCH: 29.3 pg (ref 26.6–33.0)
MCHC: 33.8 g/dL (ref 31.5–35.7)
MCV: 87 fL (ref 79–97)
Platelets: 182 10*3/uL (ref 150–450)
RBC: 4.85 x10E6/uL (ref 4.14–5.80)
RDW: 13 % (ref 11.6–15.4)
WBC: 12.3 10*3/uL — ABNORMAL HIGH (ref 3.4–10.8)

## 2021-05-31 LAB — LIPID PANEL
Chol/HDL Ratio: 3.6 ratio (ref 0.0–5.0)
Cholesterol, Total: 134 mg/dL (ref 100–199)
HDL: 37 mg/dL — ABNORMAL LOW (ref >39)
LDL Chol Calc (NIH): 85 mg/dL (ref 0–99)
Triglycerides: 57 mg/dL (ref 0–149)
VLDL Cholesterol Cal: 12 mg/dL (ref 5–40)

## 2021-05-31 LAB — CMP14+EGFR
ALT: 16 IU/L (ref 0–44)
AST: 16 IU/L (ref 0–40)
Albumin/Globulin Ratio: 1.1 — ABNORMAL LOW (ref 1.2–2.2)
Albumin: 3.8 g/dL (ref 3.8–4.9)
Alkaline Phosphatase: 71 IU/L (ref 44–121)
BUN/Creatinine Ratio: 9 (ref 9–20)
BUN: 7 mg/dL (ref 6–24)
Bilirubin Total: 0.4 mg/dL (ref 0.0–1.2)
CO2: 28 mmol/L (ref 20–29)
Calcium: 9.2 mg/dL (ref 8.7–10.2)
Chloride: 98 mmol/L (ref 96–106)
Creatinine, Ser: 0.82 mg/dL (ref 0.76–1.27)
Globulin, Total: 3.5 g/dL (ref 1.5–4.5)
Glucose: 94 mg/dL (ref 70–99)
Potassium: 4.5 mmol/L (ref 3.5–5.2)
Sodium: 140 mmol/L (ref 134–144)
Total Protein: 7.3 g/dL (ref 6.0–8.5)
eGFR: 106 mL/min/{1.73_m2} (ref >59)

## 2021-05-31 LAB — HEMOGLOBIN A1C
Est. average glucose Bld gHb Est-mCnc: 128 mg/dL
Hgb A1c MFr Bld: 6.1 % — ABNORMAL HIGH (ref 4.8–5.6)

## 2021-05-31 MED ORDER — ROSUVASTATIN CALCIUM 20 MG PO TABS: 20.0000 mg | ORAL_TABLET | Freq: Every day | ORAL | 3 refills | Status: DC

## 2021-05-31 NOTE — Progress Notes (Signed)
Patient is prediabetic avoid sugar sweets soda.  White blood cell is elevated but better at 12.3 was previously 14.4.  Patient should report fever chills.   Kidney function is stable.  LDL of 85 , goal is less than 70.  Patient should start taking rosuvastatin 20 mg daily.  Avoid fatty fried foods.  Patient should have fasting labs done 3 to 5 days before his next appointment in 2 months.  Kindly schedule a follow-up appointment in 2 months for hyperlipidemia.   The 10-year ASCVD risk score (Arnett DK, et al., 2019) is: 14.2%   Values used to calculate the score:     Age: 51 years     Sex: Male     Is Non-Hispanic African American: Yes     Diabetic: Yes     Tobacco smoker: No     Systolic Blood Pressure: 118 mmHg     Is BP treated: Yes     HDL Cholesterol: 37 mg/dL     Total Cholesterol: 134 mg/dL

## 2021-06-03 ENCOUNTER — Telehealth: Payer: Medicare HMO | Admitting: Physician Assistant

## 2021-06-03 DIAGNOSIS — M5442 Lumbago with sciatica, left side: Secondary | ICD-10-CM | POA: Diagnosis not present

## 2021-06-03 MED ORDER — NAPROXEN 500 MG PO TABS: 500.0000 mg | ORAL_TABLET | Freq: Two times a day (BID) | ORAL | 0 refills | Status: DC

## 2021-06-03 MED ORDER — CYCLOBENZAPRINE HCL 10 MG PO TABS: 5.0000 mg | ORAL_TABLET | Freq: Three times a day (TID) | ORAL | 0 refills | Status: DC | PRN

## 2021-06-03 NOTE — Progress Notes (Signed)

## 2021-06-04 ENCOUNTER — Emergency Department (HOSPITAL_COMMUNITY)
Admission: EM | Admit: 2021-06-04 | Discharge: 2021-06-04 | Disposition: A | Discharge status: Home / Self Care | Payer: Medicare HMO | Attending: Emergency Medicine | Admitting: Emergency Medicine

## 2021-06-04 ENCOUNTER — Encounter (HOSPITAL_COMMUNITY): Payer: Self-pay

## 2021-06-04 ENCOUNTER — Telehealth: Payer: Self-pay | Admitting: Nurse Practitioner

## 2021-06-04 ENCOUNTER — Encounter: Payer: Self-pay | Admitting: *Deleted

## 2021-06-04 ENCOUNTER — Other Ambulatory Visit: Payer: Self-pay

## 2021-06-04 DIAGNOSIS — R3915 Urgency of urination: Secondary | ICD-10-CM | POA: Insufficient documentation

## 2021-06-04 DIAGNOSIS — M545 Low back pain, unspecified: Secondary | ICD-10-CM | POA: Diagnosis not present

## 2021-06-04 DIAGNOSIS — M6281 Muscle weakness (generalized): Secondary | ICD-10-CM | POA: Diagnosis not present

## 2021-06-04 DIAGNOSIS — M549 Dorsalgia, unspecified: Secondary | ICD-10-CM | POA: Diagnosis not present

## 2021-06-04 DIAGNOSIS — Z91040 Latex allergy status: Secondary | ICD-10-CM | POA: Insufficient documentation

## 2021-06-04 DIAGNOSIS — R600 Localized edema: Secondary | ICD-10-CM | POA: Diagnosis not present

## 2021-06-04 DIAGNOSIS — R52 Pain, unspecified: Secondary | ICD-10-CM | POA: Diagnosis not present

## 2021-06-04 DIAGNOSIS — Z79899 Other long term (current) drug therapy: Secondary | ICD-10-CM | POA: Insufficient documentation

## 2021-06-04 DIAGNOSIS — I10 Essential (primary) hypertension: Secondary | ICD-10-CM | POA: Diagnosis not present

## 2021-06-04 LAB — URINALYSIS, ROUTINE W REFLEX MICROSCOPIC
Bilirubin Urine: NEGATIVE
Glucose, UA: NEGATIVE mg/dL
Hgb urine dipstick: NEGATIVE
Ketones, ur: NEGATIVE mg/dL
Leukocytes,Ua: NEGATIVE
Nitrite: NEGATIVE
Protein, ur: NEGATIVE mg/dL
Specific Gravity, Urine: 1.016 (ref 1.005–1.030)
pH: 7 (ref 5.0–8.0)

## 2021-06-04 MED ORDER — DOXYCYCLINE HYCLATE 100 MG PO TABS
100.0000 mg | ORAL_TABLET | Freq: Once | ORAL | Status: AC
Start: 2021-06-04 — End: 2021-06-04
  Administered 2021-06-04: 100 mg via ORAL
  Filled 2021-06-04: qty 1

## 2021-06-04 MED ORDER — DOXYCYCLINE HYCLATE 100 MG PO CAPS: 100.0000 mg | ORAL_CAPSULE | Freq: Two times a day (BID) | ORAL | 0 refills | Status: DC

## 2021-06-04 NOTE — Telephone Encounter (Signed)
Spoke with pt he states that he has a fistula in his back and in the past it was infected and he had bacteria growing from his bottom. Pt states that he has a slight fever, back pain and dark urine. Spoke with Kristin Bruins advised of pt's problem, offered to have an appt to see if there is a uti fola didn't have anything to come into office today offered for pt to see another provider. Pt states that he will go to the ER.

## 2021-06-04 NOTE — ED Triage Notes (Signed)
Pt brought in by [facility] c/o chronic back pain in lower middle back. Had 500 mg tylenol around 0800.  Also states possible abcess in middle of buttocks.  Dark urine, denies pain with urination, denies N/V. Pain in groin on both sides and down back of legs.  Went to DR last week for labs, was told WBC were elevated.

## 2021-06-04 NOTE — Telephone Encounter (Signed)
Please call pt regarding Blood Work - pt still sick and said he has infection in his body somewhere.  Please call this am [phone]

## 2021-06-04 NOTE — ED Provider Notes (Signed)
[facility] EMERGENCY DEPARTMENT Provider Note   CSN: [id number] Arrival date & time: 06/04/21  1124     History  Chief Complaint  Patient presents with   Back Pain    Jordan Jensen is a 51 y.o. male.   Back Pain  This patient is a 51 year old male who has a history of being a C5-C6 paraplegic, he has regained some strength and is able to ambulate with a walker and or a wheelchair.  Both he and his significant other reports that over the last 6 months he has had some progressive weakness but over the last 2 weeks seems to be even more weak and has actually had a feeling of lower back pain over the last couple of days.  He developed a feeling of some urinary urgency, the urine was dark and smelled foul, he returns today after being told that his blood counts were elevated at a doctor's appointment last week.  His white blood cell count was just over 12,000, he was not having any symptoms at that time.  The patient denies a fever though he did measure a temperature of 99.6 or 99.8 at home today.  He has a history of a fistula and this intermittently becomes infected, he has a little bit of itching around his anus but no frank pain.  No belly pain no difficulty breathing no headache  Home Medications Prior to Admission medications   Medication Sig Start Date End Date Taking? Authorizing Provider  doxycycline (VIBRAMYCIN) 100 MG capsule Take 1 capsule (100 mg total) by mouth 2 (two) times daily. 06/04/21  Yes Eber Hong, MD  acetaminophen (TYLENOL) 325 MG tablet Take 2 tablets (650 mg total) by mouth every 6 (six) hours as needed for mild pain (or Fever >/= 101). 02/11/17   Maxie Barb, MD  albuterol (PROVENTIL) (2.5 MG/3ML) 0.083% nebulizer solution Take 3 mLs (2.5 mg total) by nebulization every 6 (six) hours as needed for wheezing or shortness of breath. 08/31/20   Coralyn Helling, MD  albuterol (VENTOLIN HFA) 108 (90 Base) MCG/ACT inhaler Inhale 2 puffs into the lungs every 6 (six)  hours as needed for wheezing or shortness of breath. 08/31/20   Coralyn Helling, MD  budesonide-formoterol (SYMBICORT) 80-4.5 MCG/ACT inhaler Inhale 2 puffs into the lungs in the morning and at bedtime. Brush tongue and rinse mouth afterwards 12/14/20   Cobb, [name], NP  cyclobenzaprine (FLEXERIL) 10 MG tablet Take 0.5-1 tablets (5-10 mg total) by mouth 3 (three) times daily as needed for muscle spasms. 06/03/21   Margaretann Loveless, PA-C  diclofenac Sodium (VOLTAREN) 1 % GEL Apply 2 g topically 4 (four) times daily. 05/17/21   Paseda, Baird Kay, FNP  fluticasone (FLONASE) 50 MCG/ACT nasal spray Place 2 sprays into both nostrils daily. 01/28/21   Katha Cabal, DO  furosemide (LASIX) 40 MG tablet Take 40 mg by mouth daily.    [provider]  metoprolol succinate (TOPROL XL) 25 MG 24 hr tablet Take 1 tablet (25 mg total) by mouth daily. 12/06/20   Strader, Lennart Pall, PA-C  montelukast (SINGULAIR) 10 MG tablet Take 10 mg by mouth daily. 11/11/19   [provider]  naproxen (NAPROSYN) 500 MG tablet Take 1 tablet (500 mg total) by mouth 2 (two) times daily with a meal. 06/03/21   Burnette, Alessandra Bevels, PA-C  rosuvastatin (CRESTOR) 20 MG tablet Take 1 tablet (20 mg total) by mouth daily. 05/31/21   Donell Beers, FNP  sacubitril-valsartan (ENTRESTO) 24-26  MG Take 1 tablet by mouth 2 (two) times daily. 08/30/20   Strader, Lennart Pall, PA-C  atorvastatin (LIPITOR) 10 MG tablet Take 10 mg by mouth daily.  Patient not taking: Reported on 11/14/2019 11/30/18 12/13/19  [provider]      Allergies    Latex    Review of Systems   Review of Systems  Musculoskeletal:  Positive for back pain.  All other systems reviewed and are negative.  Physical Exam Updated Vital Signs BP 127/62   Pulse 75   Temp 98.4 F (36.9 C) (Oral)   Resp 13   Ht 1.803 m (5\' 11" )   Wt (!) 208.7 kg   SpO2 93%   BMI 64.16 kg/m  Physical Exam Vitals and nursing note reviewed.  Constitutional:       General: He is not in acute distress.    Appearance: He is well-developed.  HENT:     Head: Normocephalic and atraumatic.     Mouth/Throat:     Pharynx: No oropharyngeal exudate.  Eyes:     General: No scleral icterus.       Right eye: No discharge.        Left eye: No discharge.     Conjunctiva/sclera: Conjunctivae normal.     Pupils: Pupils are equal, round, and reactive to light.  Neck:     Thyroid: No thyromegaly.     Vascular: No JVD.  Cardiovascular:     Rate and Rhythm: Normal rate and regular rhythm.     Heart sounds: Normal heart sounds. No murmur heard.   No friction rub. No gallop.  Pulmonary:     Effort: Pulmonary effort is normal. No respiratory distress.     Breath sounds: Normal breath sounds. No wheezing or rales.  Abdominal:     General: Bowel sounds are normal. There is no distension.     Palpations: Abdomen is soft. There is no mass.     Tenderness: There is no abdominal tenderness.  Genitourinary:    Comments: Patient rolls onto his side, able to examine the perirectal area, there is no redness induration swelling or particular tenderness.  Normal-appearing skin and gluteal cleft. Musculoskeletal:        General: No tenderness. Normal range of motion.     Cervical back: Normal range of motion and neck supple.     Right lower leg: Edema present.     Left lower leg: Edema present.  Lymphadenopathy:     Cervical: No cervical adenopathy.  Skin:    General: Skin is warm and dry.     Findings: No erythema or rash.  Neurological:     Mental Status: He is alert.     Coordination: Coordination normal.     Comments: Some generalized weakness of the lower extremities, he is able to roll onto his side using his arms very well, he is able to move both of his legs but mild generalized weakness  Psychiatric:        Behavior: Behavior normal.    ED Results / Procedures / Treatments   Labs (all labs ordered are listed, but only abnormal results are  displayed) Labs Reviewed  URINE CULTURE  URINALYSIS, ROUTINE W REFLEX MICROSCOPIC    EKG EKG Interpretation  Date/Time:  Tuesday Jun 04 2021 11:56:07 EDT Ventricular Rate:  66 PR Interval:  166 QRS Duration: 93 QT Interval:  407 QTC Calculation: 427 R Axis:   16 Text Interpretation: Sinus rhythm Low voltage, precordial leads Nonspecific T  abnormalities, lateral leads Confirmed by Eber Hong ([id number]) on 06/04/2021 12:50:35 PM  Radiology No results found.  Procedures Procedures    Medications Ordered in ED Medications  doxycycline (VIBRA-TABS) tablet 100 mg (has no administration in time range)    ED Course/ Medical Decision Making/ A&P Clinical Course as of 06/04/21 1523  Tue Jun 04, 2021  1358 Urinalysis negative, will proceed with a stronger antibiotic to cover skin and soft tissue such as doxycycline [BM]    Clinical Course User Index [BM] Eber Hong, MD                           Medical Decision Making Amount and/or Complexity of Data Reviewed Labs: ordered.  Risk Prescription drug management.   This patient is not tachycardic, he does not have a fever, he does not have hypotension, he is not coughing or short of breath.  The patient has what appears to be dark urine which is at the bedside which will need to be checked to make sure it is not infected.  He has some lower back pain, he is unfortunately suffering with multiple chronic conditions including spinal cord injuries which have left him with some weakness.  He is undergone physical therapy both in inpatient unit as an outpatient units, currently living with his spouse and able to get around but with some weakness today.  Urinalysis pending, culture pending, if negative may need treatment for skin and soft tissue or gastrointestinal type illnesses as he states that when he gets itchy around his rectum it sometimes means a bacterial cause and though I do not see any signs of significant infection including  fever tachycardia induration or fluctuance or purulence or foul smell this may be of some benefit.  The patient's vital signs of remained normal, last blood pressure was 127/62 with a heart rate of 75, temperature is 98.4, respirations are 13 and oxygen is currently 99% on room air.  He has no shortness of breath, no abdominal pain, no chest pain, denies any rashes, still feels a bit of an itching around his anus and his wholeheartedly concerned that this may be related to a gastrointestinal infection of sorts.  Though I do not see any obvious skin or soft tissue infection I think it would be reasonable to try a course of doxycycline just in case this is something that is causing his elevated white blood cell count.  He has already called his doctor and set up an a follow-up appointment for June 13, he is agreeable to the plan and understands indications for return including increasing fevers chills vomiting or any other worsening symptoms.  Both he and his significant other at the bedside are agreeable and stable for discharge at this time.  The patient does not have any focal neurologic deficits outside of his normal baseline neuro abnormalities  Labs: I personally viewed and interpreted the urinalysis which is totally normal without signs of infection        Final Clinical Impression(s) / ED Diagnoses Final diagnoses:  Low back pain without sciatica, unspecified back pain laterality, unspecified chronicity    Rx / DC Orders ED Discharge Orders          Ordered    doxycycline (VIBRAMYCIN) 100 MG capsule  2 times daily        06/04/21 1520              Eber Hong, MD 06/04/21 1523

## 2021-06-04 NOTE — Discharge Instructions (Signed)
Your blood work from the other day showed that your white blood cell count was just over 12,000, this is a little bit high, it is higher than normal, I would like for you to take doxycycline twice a day for the next 10 days and you will need to have your blood rechecked at your doctor's office.  If you should develop worsening pain fevers vomiting or any other severe or worsening symptoms please return to the emergency department.

## 2021-06-05 LAB — URINE CULTURE: Culture: 1000 — AB

## 2021-06-06 ENCOUNTER — Telehealth: Payer: Self-pay | Admitting: *Deleted

## 2021-06-06 NOTE — Telephone Encounter (Signed)
Post ED Visit - Positive Culture Follow-up  Culture report reviewed by antimicrobial stewardship pharmacist: [facility] []  , Pharm.D. []  Enzo Bi, Pharm.D., BCPS AQ-ID []  , Pharm.D., BCPS []  Celedonio Miyamoto, Pharm.D., BCPS []  [place], Garvin Fila.D., BCPS, AAHIVP []  , Pharm.D., BCPS, AAHIVP []  Georgina Pillion, PharmD, BCPS []  , PharmD, BCPS []  [place], PharmD, BCPS []  [address], PharmD []  , PharmD, BCPS []  Estella Husk, PharmD  Pharmacy Team []  Lysle Pearl, PharmD []  , PharmD []  Phillips Climes, PharmD []  , Rph []  Agapito Games) , PharmD []  Verlan Friends, PharmD []  , PharmD []  Mervyn Gay, PharmD []  , PharmD []  Vinnie Level, PharmD []  [facility], PharmD []  , PharmD []  Len Childs, PharmD   Positive urine culture Treated with Doxycycline, organism sensitive to the same and no further patient follow-up is required at this time.  , PharmD  Greer Pickerel Talley 06/06/2021, 10:53 AM

## 2021-06-07 ENCOUNTER — Telehealth: Payer: Self-pay

## 2021-06-07 NOTE — Telephone Encounter (Signed)
Patient called to see when he will be setup for Physical therapy at home, he has not heard anything yet. Call back # [phone].

## 2021-06-07 NOTE — Telephone Encounter (Signed)
Please advise 

## 2021-06-10 ENCOUNTER — Other Ambulatory Visit: Payer: Self-pay

## 2021-06-11 ENCOUNTER — Other Ambulatory Visit: Payer: Self-pay

## 2021-06-11 DIAGNOSIS — G8250 Quadriplegia, unspecified: Secondary | ICD-10-CM

## 2021-06-11 NOTE — Telephone Encounter (Signed)
Referral placed correctly contacted pt to let him know no answer left vm

## 2021-06-12 NOTE — Telephone Encounter (Signed)
Called pt no answer left vm 

## 2021-06-12 NOTE — Telephone Encounter (Signed)
Patient returning call. [phone]

## 2021-06-12 NOTE — Telephone Encounter (Signed)
Spoke with pt advised referral has been placed  

## 2021-06-17 ENCOUNTER — Ambulatory Visit: Payer: Medicare HMO | Admitting: Internal Medicine
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
## 2021-06-18 ENCOUNTER — Inpatient Hospital Stay (HOSPITAL_COMMUNITY): Payer: Medicare HMO | Attending: Hematology | Admitting: Hematology

## 2021-06-18 ENCOUNTER — Encounter (HOSPITAL_COMMUNITY): Payer: Self-pay | Admitting: Hematology

## 2021-06-18 ENCOUNTER — Inpatient Hospital Stay (HOSPITAL_COMMUNITY): Payer: Medicare HMO

## 2021-06-18 VITALS — BP 133/90 | HR 85 | Temp 98.8°F | Resp 16

## 2021-06-18 DIAGNOSIS — M545 Low back pain, unspecified: Secondary | ICD-10-CM | POA: Insufficient documentation

## 2021-06-18 DIAGNOSIS — D72828 Other elevated white blood cell count: Secondary | ICD-10-CM

## 2021-06-18 DIAGNOSIS — Z993 Dependence on wheelchair: Secondary | ICD-10-CM | POA: Diagnosis not present

## 2021-06-18 DIAGNOSIS — I110 Hypertensive heart disease with heart failure: Secondary | ICD-10-CM | POA: Diagnosis not present

## 2021-06-18 DIAGNOSIS — Z808 Family history of malignant neoplasm of other organs or systems: Secondary | ICD-10-CM | POA: Insufficient documentation

## 2021-06-18 DIAGNOSIS — G8220 Paraplegia, unspecified: Secondary | ICD-10-CM | POA: Insufficient documentation

## 2021-06-18 DIAGNOSIS — E785 Hyperlipidemia, unspecified: Secondary | ICD-10-CM | POA: Insufficient documentation

## 2021-06-18 DIAGNOSIS — G8929 Other chronic pain: Secondary | ICD-10-CM | POA: Diagnosis not present

## 2021-06-18 DIAGNOSIS — Z79899 Other long term (current) drug therapy: Secondary | ICD-10-CM | POA: Diagnosis not present

## 2021-06-18 DIAGNOSIS — Z87891 Personal history of nicotine dependence: Secondary | ICD-10-CM | POA: Insufficient documentation

## 2021-06-18 DIAGNOSIS — Z803 Family history of malignant neoplasm of breast: Secondary | ICD-10-CM | POA: Insufficient documentation

## 2021-06-18 DIAGNOSIS — Z6841 Body Mass Index (BMI) 40.0 and over, adult: Secondary | ICD-10-CM | POA: Diagnosis not present

## 2021-06-18 DIAGNOSIS — I509 Heart failure, unspecified: Secondary | ICD-10-CM | POA: Insufficient documentation

## 2021-06-18 DIAGNOSIS — Z800 Family history of malignant neoplasm of digestive organs: Secondary | ICD-10-CM | POA: Diagnosis not present

## 2021-06-18 DIAGNOSIS — Z8673 Personal history of transient ischemic attack (TIA), and cerebral infarction without residual deficits: Secondary | ICD-10-CM | POA: Diagnosis not present

## 2021-06-18 DIAGNOSIS — R6889 Other general symptoms and signs: Secondary | ICD-10-CM | POA: Diagnosis not present

## 2021-06-18 LAB — C-REACTIVE PROTEIN: CRP: 1.5 mg/dL — ABNORMAL HIGH (ref <1.0)

## 2021-06-18 LAB — CBC WITH DIFFERENTIAL/PLATELET
Abs Immature Granulocytes: 0.04 10*3/uL (ref 0.00–0.07)
Basophils Absolute: 0 10*3/uL (ref 0.0–0.1)
Basophils Relative: 0 %
Eosinophils Absolute: 0.2 10*3/uL (ref 0.0–0.5)
Eosinophils Relative: 2 %
HCT: 47.2 % (ref 39.0–52.0)
Hemoglobin: 14.7 g/dL (ref 13.0–17.0)
Immature Granulocytes: 0 %
Lymphocytes Relative: 19 %
Lymphs Abs: 2.2 10*3/uL (ref 0.7–4.0)
MCH: 28.8 pg (ref 26.0–34.0)
MCHC: 31.1 g/dL (ref 30.0–36.0)
MCV: 92.5 fL (ref 80.0–100.0)
Monocytes Absolute: 0.7 10*3/uL (ref 0.1–1.0)
Monocytes Relative: 6 %
Neutro Abs: 8.5 10*3/uL — ABNORMAL HIGH (ref 1.7–7.7)
Neutrophils Relative %: 73 %
Platelets: 168 10*3/uL (ref 150–400)
RBC: 5.1 MIL/uL (ref 4.22–5.81)
RDW: 13.4 % (ref 11.5–15.5)
WBC: 11.7 10*3/uL — ABNORMAL HIGH (ref 4.0–10.5)
nRBC: 0 % (ref 0.0–0.2)

## 2021-06-18 LAB — SEDIMENTATION RATE: Sed Rate: 26 mm/hr — ABNORMAL HIGH (ref 0–16)

## 2021-06-18 LAB — LACTATE DEHYDROGENASE: LDH: 154 U/L (ref 98–192)

## 2021-06-18 NOTE — Patient Instructions (Signed)
[facility] at [facility] Discharge Instructions  You were seen today by Dr. Ellin Saba & Rojelio Brenner PA-C for your elevated white blood cells.  Your elevated white blood cells are likely reactive from your frequent infections and obesity.  However, we will check some additional LABS TODAY to make sure you do not have any other potentially cancerous causes of your high white blood cells.  FOLLOW-UP APPOINTMENT: Office visit in 3 weeks to discuss results and next steps.   - - - - - - - - - - - - - - - - -    Thank you for choosing [facility] at [facility] to provide your oncology and hematology care.  To afford each patient quality time with our provider, please arrive at least 15 minutes before your scheduled appointment time.   If you have a lab appointment with the Cancer Center please come in thru the Main Entrance and check in at the main information desk.  You need to re-schedule your appointment should you arrive 10 or more minutes late.  We strive to give you quality time with our providers, and arriving late affects you and other patients whose appointments are after yours.  Also, if you no show three or more times for appointments you may be dismissed from the clinic at the providers discretion.     Again, thank you for choosing [facility].  Our hope is that these requests will decrease the amount of time that you wait before being seen by our physicians.       _____________________________________________________________  Should you have questions after your visit to [facility], please contact our office at [phone] and follow the prompts.  Our office hours are 8:00 a.m. and 4:30 p.m. Monday - Friday.  Please note that voicemails left after 4:00 p.m. may not be returned until the following business day.  We are closed weekends and major holidays.  You do have access to a nurse 24-7, just call the main  number to the clinic [phone] and do not press any options, hold on the line and a nurse will answer the phone.    For prescription refill requests, have your pharmacy contact our office and allow 72 hours.    Due to Covid, you will need to wear a mask upon entering the hospital. If you do not have a mask, a mask will be given to you at the Main Entrance upon arrival. For doctor visits, patients may have 1 support person age 84 or older with them. For treatment visits, patients can not have anyone with them due to social distancing guidelines and our immunocompromised population.

## 2021-06-20 ENCOUNTER — Telehealth: Payer: Self-pay | Admitting: Gastroenterology

## 2021-06-20 ENCOUNTER — Other Ambulatory Visit: Payer: Self-pay

## 2021-06-20 DIAGNOSIS — R195 Other fecal abnormalities: Secondary | ICD-10-CM

## 2021-06-20 NOTE — Telephone Encounter (Signed)
Patient has been scheduled for 08/06/21 @ 8:30am. Patient agreed to this time and was scheduled for a previsit appointment 07/30/21 @ 1pm.

## 2021-06-20 NOTE — Telephone Encounter (Signed)
Maya is this pt on the wait list?

## 2021-06-20 NOTE — Telephone Encounter (Signed)
I called him to let him know he is on the waiting list but he states he was just called and scheduled for an appt. Is he scheduled? I didn't see an appt

## 2021-06-20 NOTE — Telephone Encounter (Signed)
Ok great I just wanted to make sure he was correct.

## 2021-06-21 LAB — BCR-ABL1 FISH
Cells Analyzed: 200
Cells Counted: 200

## 2021-06-26 LAB — CALR +MPL + E12-E15  (REFLEX)

## 2021-06-26 LAB — JAK2 V617F RFX CALR/MPL/E12-15

## 2021-07-11 ENCOUNTER — Ambulatory Visit (HOSPITAL_COMMUNITY): Payer: Medicare HMO | Admitting: Physical Therapy

## 2021-07-16 ENCOUNTER — Ambulatory Visit (HOSPITAL_COMMUNITY): Payer: Medicare HMO | Admitting: Physician Assistant
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
## 2021-07-18 ENCOUNTER — Ambulatory Visit (HOSPITAL_COMMUNITY): Payer: Medicare HMO | Admitting: Physician Assistant

## 2021-07-18 ENCOUNTER — Inpatient Hospital Stay (HOSPITAL_COMMUNITY): Payer: Medicare Other | Attending: Hematology | Admitting: Physician Assistant

## 2021-07-18 VITALS — BP 127/82 | HR 86 | Temp 98.9°F | Resp 16

## 2021-07-18 DIAGNOSIS — I110 Hypertensive heart disease with heart failure: Secondary | ICD-10-CM | POA: Insufficient documentation

## 2021-07-18 DIAGNOSIS — Z79899 Other long term (current) drug therapy: Secondary | ICD-10-CM | POA: Insufficient documentation

## 2021-07-18 DIAGNOSIS — I509 Heart failure, unspecified: Secondary | ICD-10-CM | POA: Diagnosis not present

## 2021-07-18 DIAGNOSIS — Z993 Dependence on wheelchair: Secondary | ICD-10-CM | POA: Diagnosis not present

## 2021-07-18 DIAGNOSIS — D72828 Other elevated white blood cell count: Secondary | ICD-10-CM | POA: Diagnosis not present

## 2021-07-18 NOTE — Patient Instructions (Signed)
[facility] Discharge Instructions  You were seen today by Dr. Ellin Saba & Rojelio Brenner PA-C for your elevated white blood cells.   Based on the labs that we checked, your white blood cell labs do not show any signs of blood cancer.  We suspect that your white blood cells are elevated due to your frequent infections, chronic fistula/abscess, and obesity.  You do not need any further testing or appointments at the hematology clinic [facility]).  However, you should continue to follow-up with your primary care doctor and should have your blood counts checked at least once per year.  Your primary care doctor can refer you back to see [place] in the future if needed if you have persistently elevated white blood cells > 20 (other than during active infections), or if you have any elevation in blood cells with unexplained fever, chills, night sweats, or weight loss.     - - - - - - - - - - - - - - - - -    Thank you for choosing  Can[facility] Center at [facility] to provide your oncology and hematology care.  To afford each patient quality time with our provider, please arrive at least 15 minutes before your scheduled appointment time.   If you have a lab appointment with the Cancer Center please come in thru the Main Entrance and check in at the main information desk.  You need to re-schedule your appointment should you arrive 10 or more minutes late.  We strive to give you quality time with our providers, and arriving late affects you and other patients whose appointments are after yours.  Also, if you no show three or more times for appointments you may be dismissed from the clinic at the providers discretion.     Again, thank you for choosing [facility].  Our hope is that these requests will decrease the amount of time that you wait before being seen by our physicians.        _____________________________________________________________  Should you have questions after your visit to [facility], please contact our office at [phone] and follow the prompts.  Our office hours are 8:00 a.m. and 4:30 p.m. Monday - Friday.  Please note that voicemails left after 4:00 p.m. may not be returned until the following business day.  We are closed weekends and major holidays.  You do have access to a nurse 24-7, just call the main number to the clinic [phone] and do not press any options, hold on the line and a nurse will answer the phone.    For prescription refill requests, have your pharmacy contact our office and allow 72 hours.    Due to Covid, you will need to wear a mask upon entering the hospital. If you do not have a mask, a mask will be given to you at the Main Entrance upon arrival. For doctor visits, patients may have 1 support person age 30 or older with them. For treatment visits, patients can not have anyone with them due to social distancing guidelines and our immunocompromised population.

## 2021-07-26 ENCOUNTER — Ambulatory Visit: Payer: Medicare HMO | Admitting: Internal Medicine

## 2021-07-30 ENCOUNTER — Ambulatory Visit (AMBULATORY_SURGERY_CENTER): Payer: Self-pay | Admitting: *Deleted

## 2021-07-30 ENCOUNTER — Encounter (HOSPITAL_COMMUNITY): Payer: Self-pay | Admitting: Gastroenterology

## 2021-07-30 VITALS — Ht 71.0 in | Wt >= 6400 oz

## 2021-07-30 DIAGNOSIS — R195 Other fecal abnormalities: Secondary | ICD-10-CM

## 2021-07-30 DIAGNOSIS — Z1211 Encounter for screening for malignant neoplasm of colon: Secondary | ICD-10-CM

## 2021-07-30 MED ORDER — PEG 3350-KCL-NA BICARB-NACL 420 G PO SOLR: 4000.0000 mL | Freq: Once | ORAL | 0 refills | Status: AC

## 2021-07-30 NOTE — Progress Notes (Signed)
No egg or soy allergy known to patient  No issues known to pt with past sedation with any surgeries or procedures Patient denies ever being told they had issues or difficulty with intubation  No FH of Malignant Hyperthermia Pt is not on diet pills Pt is not on  home 02  Pt is not on blood thinners  Pt denies issues with constipation  No A fib or A flutter Have any cardiac testing pending--no Pt instructed to use Singlecare.com or GoodRx for a price reduction on prep   

## 2021-08-01 ENCOUNTER — Ambulatory Visit (INDEPENDENT_AMBULATORY_CARE_PROVIDER_SITE_OTHER): Payer: Medicare Other | Admitting: Nurse Practitioner

## 2021-08-01 ENCOUNTER — Encounter: Payer: Self-pay | Admitting: Nurse Practitioner

## 2021-08-01 VITALS — BP 126/83 | HR 79 | Ht 71.0 in | Wt >= 6400 oz

## 2021-08-01 DIAGNOSIS — E7849 Other hyperlipidemia: Secondary | ICD-10-CM

## 2021-08-01 DIAGNOSIS — G8250 Quadriplegia, unspecified: Secondary | ICD-10-CM

## 2021-08-01 DIAGNOSIS — Z789 Other specified health status: Secondary | ICD-10-CM

## 2021-08-01 DIAGNOSIS — Z7409 Other reduced mobility: Secondary | ICD-10-CM

## 2021-08-01 DIAGNOSIS — I10 Essential (primary) hypertension: Secondary | ICD-10-CM | POA: Diagnosis not present

## 2021-08-01 DIAGNOSIS — Z7689 Persons encountering health services in other specified circumstances: Secondary | ICD-10-CM | POA: Diagnosis not present

## 2021-08-01 NOTE — Patient Instructions (Addendum)
Please call the nutritionist and schedule a follow up appointment [phone]  Please get the second dose of your shingles vaccine at your pharmacy   It is important that you exercise regularly at least 30 minutes 5 times a week as tolerated  Think about what you will eat, plan ahead. Choose " clean, green, fresh or frozen" over canned, processed or packaged foods which are more sugary, salty and fatty. 70 to 75% of food eaten should be vegetables and fruit. Three meals at set times with snacks allowed between meals, but they must be fruit or vegetables. Aim to eat over a 12 hour period , example 7 am to 7 pm, and STOP after  your last meal of the day. Drink water,generally about 64 ounces per day, no other drink is as healthy. Fruit juice is best enjoyed in a healthy way, by EATING the fruit.  Thanks for choosing [facility], we consider it a privelige to serve you.
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
## 2021-08-01 NOTE — Assessment & Plan Note (Addendum)
BP Readings from Last 3 Encounters:  08/01/21 126/83  07/18/21 127/82  06/18/21 133/90  Condition well-controlled Continue metoprolol 25 mg daily, Entresto 24-26 mg 1 tablet twice daily, Lasix 40 mg daily Patient encouraged to maintain close follow-up with cardiology DASH diet advised engage in regular exercises as tolerated

## 2021-08-01 NOTE — Assessment & Plan Note (Signed)
Lab Results  Component Value Date   CHOL 134 05/30/2021   HDL 37 (L) 05/30/2021   LDLCALC 85 05/30/2021   LDLDIRECT 72 07/23/2020   TRIG 57 05/30/2021   CHOLHDL 3.6 05/30/2021  Continue current dose of rosuvastatin 20 mg daily Will recheck labs at next visit Avoid fatty fried foods

## 2021-08-01 NOTE — Assessment & Plan Note (Signed)
Needs power chair to help with mobility Referral sent to OT for evaluation
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
## 2021-08-02 NOTE — Addendum Note (Signed)
Addended by: Abner Greenspan on: 08/02/2021 04:27 PM   Modules accepted: Orders

## 2021-08-05 DIAGNOSIS — M6281 Muscle weakness (generalized): Secondary | ICD-10-CM | POA: Diagnosis not present

## 2021-08-05 DIAGNOSIS — J449 Chronic obstructive pulmonary disease, unspecified: Secondary | ICD-10-CM | POA: Diagnosis not present
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
## 2021-08-06 ENCOUNTER — Ambulatory Visit (HOSPITAL_BASED_OUTPATIENT_CLINIC_OR_DEPARTMENT_OTHER): Payer: Medicare Other | Admitting: Anesthesiology

## 2021-08-06 ENCOUNTER — Encounter (HOSPITAL_COMMUNITY): Payer: Self-pay | Admitting: Gastroenterology

## 2021-08-06 ENCOUNTER — Ambulatory Visit (HOSPITAL_COMMUNITY): Payer: Medicare Other | Admitting: Anesthesiology

## 2021-08-06 ENCOUNTER — Ambulatory Visit (HOSPITAL_COMMUNITY)
Admission: RE | Admit: 2021-08-06 | Discharge: 2021-08-06 | Disposition: A | Discharge status: Home / Self Care | Payer: Medicare Other | Attending: Gastroenterology | Admitting: Gastroenterology

## 2021-08-06 ENCOUNTER — Encounter (HOSPITAL_COMMUNITY)
Admission: RE | Disposition: A | Discharge status: Home / Self Care | Payer: Self-pay | Source: Home / Self Care | Attending: Gastroenterology

## 2021-08-06 ENCOUNTER — Other Ambulatory Visit: Payer: Self-pay

## 2021-08-06 DIAGNOSIS — K5730 Diverticulosis of large intestine without perforation or abscess without bleeding: Secondary | ICD-10-CM | POA: Diagnosis not present

## 2021-08-06 DIAGNOSIS — I10 Essential (primary) hypertension: Secondary | ICD-10-CM

## 2021-08-06 DIAGNOSIS — Z87891 Personal history of nicotine dependence: Secondary | ICD-10-CM

## 2021-08-06 DIAGNOSIS — Z6841 Body Mass Index (BMI) 40.0 and over, adult: Secondary | ICD-10-CM | POA: Diagnosis not present

## 2021-08-06 DIAGNOSIS — G4730 Sleep apnea, unspecified: Secondary | ICD-10-CM | POA: Insufficient documentation

## 2021-08-06 DIAGNOSIS — J45909 Unspecified asthma, uncomplicated: Secondary | ICD-10-CM | POA: Diagnosis not present

## 2021-08-06 DIAGNOSIS — Z87828 Personal history of other (healed) physical injury and trauma: Secondary | ICD-10-CM | POA: Diagnosis not present

## 2021-08-06 DIAGNOSIS — Z1211 Encounter for screening for malignant neoplasm of colon: Secondary | ICD-10-CM | POA: Diagnosis not present

## 2021-08-06 DIAGNOSIS — R195 Other fecal abnormalities: Secondary | ICD-10-CM | POA: Insufficient documentation

## 2021-08-06 DIAGNOSIS — K603 Anal fistula: Secondary | ICD-10-CM | POA: Insufficient documentation

## 2021-08-06 DIAGNOSIS — M1990 Unspecified osteoarthritis, unspecified site: Secondary | ICD-10-CM | POA: Diagnosis not present

## 2021-08-06 DIAGNOSIS — K219 Gastro-esophageal reflux disease without esophagitis: Secondary | ICD-10-CM | POA: Diagnosis not present

## 2021-08-06 DIAGNOSIS — G8250 Quadriplegia, unspecified: Secondary | ICD-10-CM | POA: Diagnosis not present

## 2021-08-06 HISTORY — PX: COLONOSCOPY WITH PROPOFOL: SHX5780

## 2021-08-06 SURGERY — COLONOSCOPY WITH PROPOFOL
Anesthesia: Monitor Anesthesia Care

## 2021-08-06 MED ORDER — PROPOFOL 1000 MG/100ML IV EMUL
INTRAVENOUS | Status: AC
  Filled 2021-08-06: qty 100

## 2021-08-06 MED ORDER — LIDOCAINE 2% (20 MG/ML) 5 ML SYRINGE
INTRAMUSCULAR | Status: DC | PRN
  Administered 2021-08-06: 100 mg via INTRAVENOUS

## 2021-08-06 MED ORDER — LACTATED RINGERS IV SOLN: INTRAVENOUS | Status: DC

## 2021-08-06 MED ORDER — PROPOFOL 500 MG/50ML IV EMUL
INTRAVENOUS | Status: DC | PRN
  Administered 2021-08-06: 100 ug/kg/min via INTRAVENOUS

## 2021-08-06 MED ORDER — PROPOFOL 10 MG/ML IV BOLUS
INTRAVENOUS | Status: DC | PRN
  Administered 2021-08-06: 40 mg via INTRAVENOUS

## 2021-08-06 MED ORDER — SODIUM CHLORIDE 0.9 % IV SOLN: INTRAVENOUS | Status: DC

## 2021-08-06 MED ORDER — LACTATED RINGERS IV SOLN
INTRAVENOUS | Status: AC | PRN
  Administered 2021-08-06: 1000 mL via INTRAVENOUS

## 2021-08-06 SURGICAL SUPPLY — 22 items

## 2021-08-06 NOTE — Anesthesia Postprocedure Evaluation (Signed)
Anesthesia Post Note  Patient: Jordan Jensen  Procedure(s) Performed: COLONOSCOPY WITH PROPOFOL     Patient location during evaluation: Endoscopy Anesthesia Type: MAC Level of consciousness: awake and alert Pain management: pain level controlled Vital Signs Assessment: post-procedure vital signs reviewed and stable Respiratory status: spontaneous breathing, nonlabored ventilation, respiratory function stable and patient connected to nasal cannula oxygen Cardiovascular status: blood pressure returned to baseline and stable Postop Assessment: no apparent nausea or vomiting Anesthetic complications: no   No notable events documented.  Last Vitals:  Vitals:   08/06/21 0948 08/06/21 0958  BP: (!) 115/97   Pulse: 74 88  Resp: (!) 22 (!) 22  Temp:    SpO2: 94% 93%    Last Pain:  Vitals:   08/06/21 0958  TempSrc:   PainSc: 0-No pain                 Barnet Glasgow

## 2021-08-06 NOTE — Discharge Instructions (Signed)
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
## 2021-08-06 NOTE — Transfer of Care (Signed)
Immediate Anesthesia Transfer of Care Note  Patient: Jordan Jensen  Procedure(s) Performed: COLONOSCOPY WITH PROPOFOL  Patient Location: PACU  Anesthesia Type:MAC  Level of Consciousness: awake, alert  and oriented  Airway & Oxygen Therapy: Patient Spontanous Breathing and Patient connected to face mask oxygen  Post-op Assessment: Report given to RN and Post -op Vital signs reviewed and stable  Post vital signs: Reviewed and stable  Last Vitals:  Vitals Value Taken Time  BP    Temp    Pulse 90 08/06/21 0939  Resp 21 08/06/21 0939  SpO2 96 % 08/06/21 0939  Vitals shown include unvalidated device data.  Last Pain:  Vitals:   08/06/21 0804  TempSrc: Oral  PainSc: 0-No pain         Complications: No notable events documented.

## 2021-08-06 NOTE — Op Note (Signed)
[facility] Patient Name: Jordan Jensen Procedure Date: 08/06/2021 MRN: [id number] Attending MD: Dub Amis. Tomasa Jensen , MD Date of Birth: 23-Jun-1970 CSN: [id number] Age: 51 Admit Type: Outpatient Procedure:                Colonoscopy Indications:              Positive Cologuard test Providers:                Lorin Picket E. [name], MD, Norman Clay, RN, Beryle Beams, Technician, Doreene Burke, CRNA Referring MD:              Medicines:                Monitored Anesthesia Care Complications:            No immediate complications. Estimated Blood Loss:     Estimated blood loss: none. Procedure:                Pre-Anesthesia Assessment:                           - Prior to the procedure, a History and Physical                            was performed, and patient medications and                            allergies were reviewed. The patient's tolerance of                            previous anesthesia was also reviewed. The risks                            and benefits of the procedure and the sedation                            options and risks were discussed with the patient.                            All questions were answered, and informed consent                            was obtained. Prior Anticoagulants: The patient has                            taken no previous anticoagulant or antiplatelet                            agents. ASA Grade Assessment: IV - A patient with                            severe systemic disease that is a constant threat  to life. After reviewing the risks and benefits,                            the patient was deemed in satisfactory condition to                            undergo the procedure.                           After obtaining informed consent, the colonoscope                            was passed under direct vision. Throughout the                            procedure, the  patient's blood pressure, pulse, and                            oxygen saturations were monitored continuously. The                            CF-HQ190L ([id number]) Olympus colonoscope was                            introduced through the anus and advanced to the the                            cecum, identified by appendiceal orifice and                            ileocecal valve. The colonoscopy was performed with                            moderate difficulty due to a redundant colon and                            significant looping. Successful completion of the                            procedure was aided by using manual pressure,                            withdrawing and reinserting the scope and                            straightening and shortening the scope to obtain                            bowel loop reduction. The patient tolerated the                            procedure well. The quality of the bowel  preparation was adequate. The ileocecal valve,                            appendiceal orifice, and rectum were photographed. Scope In: 8:59:12 AM Scope Out: 9:32:16 AM Scope Withdrawal Time: 0 hours 14 minutes 45 seconds  Total Procedure Duration: 0 hours 33 minutes 4 seconds  Findings:      The perianal exam findings include a large draining perianal fistula on       the left buttock. There was a small amount bright red blood expressed       from the fistula. No warmth, swelling or purulence present.      The digital rectal exam was normal. Pertinent negatives include normal       sphincter tone and no palpable rectal lesions.      The colon (entire examined portion) appeared normal.      The retroflexed view of the distal rectum and anal verge was normal and       showed no anal or rectal abnormalities.      A single medium-mouthed diverticulum was found in the transverse colon. Impression:               - Perianal fistula found on perianal exam.  This is                            the likely source of the positive Cologuard.                           - The entire examined colon is normal.                           - The distal rectum and anal verge are normal on                            retroflexion view.                           - Diverticulosis in the transverse colon.                           - No specimens collected. Moderate Sedation:      Not Applicable - Patient had care per Anesthesia. Recommendation:           - Patient has a contact number available for                            emergencies. The signs and symptoms of potential                            delayed complications were discussed with the                            patient. Return to normal activities tomorrow.                            Written discharge instructions were provided to the  patient.                           - Resume previous diet.                           - Continue present medications.                           - Repeat colonoscopy in 10 years for screening                            purposes.                           - Recommend surgical consultation for further                            management of perianal fistula. Procedure Code(s):        --- Professional ---                           [phone], Colonoscopy, flexible; diagnostic, including                            collection of specimen(s) by brushing or washing,                            when performed (separate procedure) Diagnosis Code(s):        --- Professional ---                           K60.3, Anal fistula                           R19.5, Other fecal abnormalities                           K57.30, Diverticulosis of large intestine without                            perforation or abscess without bleeding CPT copyright 2019 American Medical Association. All rights reserved. The codes documented in this report are preliminary and upon coder  review may  be revised to meet current compliance requirements. Jordan Kina E. [name], MD 08/06/2021 9:43:27 AM This report has been signed electronically. Number of Addenda: 0

## 2021-08-07 ENCOUNTER — Ambulatory Visit (HOSPITAL_COMMUNITY): Payer: Medicare HMO | Attending: Family Medicine | Admitting: Physical Therapy

## 2021-08-09 ENCOUNTER — Telehealth: Payer: Self-pay

## 2021-08-09 NOTE — Telephone Encounter (Signed)
Jordan Jensen stopped by from [facility] home health patient is requesting physical therapy, occupational therapy and nurse aid.  Please contact Morrie Sheldon at [phone] she is the account executive.

## 2021-08-09 NOTE — Telephone Encounter (Signed)
Please advise 

## 2021-08-09 NOTE — Telephone Encounter (Signed)
Jordan Jensen called from [facility] home [facility] referral order was entered as skill nursing assessment, but patient is asking for physical therapy only. Please call back to confirm if this okay.  Call back # [phone].

## 2021-08-13 ENCOUNTER — Telehealth: Payer: Self-pay | Admitting: Nurse Practitioner

## 2021-08-13 NOTE — Telephone Encounter (Signed)
Spoke with Jordan Jensen

## 2021-08-13 NOTE — Addendum Note (Signed)
Addended by: Abner Greenspan on: 08/13/2021 03:33 PM   Modules accepted: Orders

## 2021-08-15 DIAGNOSIS — S14105S Unspecified injury at C5 level of cervical spinal cord, sequela: Secondary | ICD-10-CM | POA: Diagnosis not present

## 2021-08-15 DIAGNOSIS — I110 Hypertensive heart disease with heart failure: Secondary | ICD-10-CM | POA: Diagnosis not present

## 2021-08-15 DIAGNOSIS — E7849 Other hyperlipidemia: Secondary | ICD-10-CM | POA: Diagnosis not present

## 2021-08-15 DIAGNOSIS — G8250 Quadriplegia, unspecified: Secondary | ICD-10-CM | POA: Diagnosis not present

## 2021-08-15 DIAGNOSIS — I509 Heart failure, unspecified: Secondary | ICD-10-CM | POA: Diagnosis not present

## 2021-08-15 DIAGNOSIS — Z993 Dependence on wheelchair: Secondary | ICD-10-CM | POA: Diagnosis not present

## 2021-08-15 NOTE — Telephone Encounter (Signed)
Error

## 2021-08-16 DIAGNOSIS — I509 Heart failure, unspecified: Secondary | ICD-10-CM | POA: Diagnosis not present

## 2021-08-16 DIAGNOSIS — Z993 Dependence on wheelchair: Secondary | ICD-10-CM | POA: Diagnosis not present

## 2021-08-16 DIAGNOSIS — S14105S Unspecified injury at C5 level of cervical spinal cord, sequela: Secondary | ICD-10-CM | POA: Diagnosis not present

## 2021-08-16 DIAGNOSIS — G8250 Quadriplegia, unspecified: Secondary | ICD-10-CM | POA: Diagnosis not present

## 2021-08-16 DIAGNOSIS — I110 Hypertensive heart disease with heart failure: Secondary | ICD-10-CM | POA: Diagnosis not present

## 2021-08-16 DIAGNOSIS — E7849 Other hyperlipidemia: Secondary | ICD-10-CM | POA: Diagnosis not present

## 2021-08-19 ENCOUNTER — Telehealth: Payer: Self-pay | Admitting: Gastroenterology

## 2021-08-19 NOTE — Telephone Encounter (Signed)
Referral faxed to [facility] 

## 2021-08-19 NOTE — Telephone Encounter (Signed)
Left message for pt that the referral has been faxed to [facility]. If he does not hear anything in 2-3 days to call [facility] at [phone].

## 2021-08-19 NOTE — Telephone Encounter (Signed)
Patient had colonoscopy at [facility] with Dr. [name] last week.  Dr. [name] recommended he be referred to a surgeon regarding his fistula.  The patient has not heard anything about the referral.  Please call patient and advise.  Thank you.

## 2021-08-20 DIAGNOSIS — G8250 Quadriplegia, unspecified: Secondary | ICD-10-CM | POA: Diagnosis not present

## 2021-08-20 DIAGNOSIS — E7849 Other hyperlipidemia: Secondary | ICD-10-CM | POA: Diagnosis not present

## 2021-08-20 DIAGNOSIS — S14105S Unspecified injury at C5 level of cervical spinal cord, sequela: Secondary | ICD-10-CM | POA: Diagnosis not present

## 2021-08-20 DIAGNOSIS — I509 Heart failure, unspecified: Secondary | ICD-10-CM | POA: Diagnosis not present

## 2021-08-20 DIAGNOSIS — J4540 Moderate persistent asthma, uncomplicated: Secondary | ICD-10-CM | POA: Diagnosis not present

## 2021-08-20 DIAGNOSIS — Z993 Dependence on wheelchair: Secondary | ICD-10-CM | POA: Diagnosis not present

## 2021-08-20 DIAGNOSIS — G4733 Obstructive sleep apnea (adult) (pediatric): Secondary | ICD-10-CM | POA: Diagnosis not present

## 2021-08-20 DIAGNOSIS — I110 Hypertensive heart disease with heart failure: Secondary | ICD-10-CM | POA: Diagnosis not present

## 2021-08-21 ENCOUNTER — Telehealth: Payer: Self-pay | Admitting: Nurse Practitioner

## 2021-08-21 NOTE — Telephone Encounter (Signed)
Patient called in regard to hoyer lift orders to [facility].  [facility] has not returned with new hoyer lift, and patient is requesting contact info for [facility].  Patient wants a call back

## 2021-08-21 NOTE — Telephone Encounter (Signed)
Called pt no answer left vm 

## 2021-08-22 DIAGNOSIS — S14105S Unspecified injury at C5 level of cervical spinal cord, sequela: Secondary | ICD-10-CM | POA: Diagnosis not present

## 2021-08-22 DIAGNOSIS — I509 Heart failure, unspecified: Secondary | ICD-10-CM | POA: Diagnosis not present

## 2021-08-22 DIAGNOSIS — I110 Hypertensive heart disease with heart failure: Secondary | ICD-10-CM | POA: Diagnosis not present

## 2021-08-22 DIAGNOSIS — G8250 Quadriplegia, unspecified: Secondary | ICD-10-CM | POA: Diagnosis not present

## 2021-08-22 DIAGNOSIS — Z993 Dependence on wheelchair: Secondary | ICD-10-CM | POA: Diagnosis not present

## 2021-08-22 DIAGNOSIS — E7849 Other hyperlipidemia: Secondary | ICD-10-CM | POA: Diagnosis not present

## 2021-08-26 ENCOUNTER — Ambulatory Visit (HOSPITAL_COMMUNITY): Payer: Medicare HMO | Admitting: Occupational Therapy

## 2021-08-26 ENCOUNTER — Telehealth: Payer: Self-pay | Admitting: Nurse Practitioner

## 2021-08-26 NOTE — Telephone Encounter (Signed)
Pt returning call for labs & other issues needed to discuss

## 2021-08-27 DIAGNOSIS — J4540 Moderate persistent asthma, uncomplicated: Secondary | ICD-10-CM | POA: Diagnosis not present

## 2021-08-27 DIAGNOSIS — G4733 Obstructive sleep apnea (adult) (pediatric): Secondary | ICD-10-CM | POA: Diagnosis not present

## 2021-08-27 NOTE — Telephone Encounter (Signed)
Spoke with pt he is wanting to inquire about home health services referral is in. Called Hilbert Corrigan with [facility] no answer left vm

## 2021-08-27 NOTE — Telephone Encounter (Signed)
Spoke with pt

## 2021-08-28 DIAGNOSIS — I509 Heart failure, unspecified: Secondary | ICD-10-CM | POA: Diagnosis not present

## 2021-08-28 DIAGNOSIS — S14105S Unspecified injury at C5 level of cervical spinal cord, sequela: Secondary | ICD-10-CM | POA: Diagnosis not present

## 2021-08-28 DIAGNOSIS — I110 Hypertensive heart disease with heart failure: Secondary | ICD-10-CM | POA: Diagnosis not present

## 2021-08-28 DIAGNOSIS — Z993 Dependence on wheelchair: Secondary | ICD-10-CM | POA: Diagnosis not present

## 2021-08-28 DIAGNOSIS — E7849 Other hyperlipidemia: Secondary | ICD-10-CM | POA: Diagnosis not present

## 2021-08-28 DIAGNOSIS — G8250 Quadriplegia, unspecified: Secondary | ICD-10-CM | POA: Diagnosis not present

## 2021-08-28 NOTE — Telephone Encounter (Signed)
Spoke with Morrie Sheldon with [facility] she's stating that what the pt is needing is called [facility] services since he has medicaid they may approve some hours but it is a process she will bring resources to help. Called pt to let him know no answer left vm

## 2021-08-29 NOTE — Telephone Encounter (Signed)
Spoke with pt advised will work on starting process when I receive info pt verbalized understanding

## 2021-08-30 ENCOUNTER — Ambulatory Visit (HOSPITAL_COMMUNITY): Payer: Medicare HMO | Admitting: Occupational Therapy

## 2021-08-30 DIAGNOSIS — I509 Heart failure, unspecified: Secondary | ICD-10-CM | POA: Diagnosis not present

## 2021-08-30 DIAGNOSIS — G8250 Quadriplegia, unspecified: Secondary | ICD-10-CM | POA: Diagnosis not present

## 2021-08-30 DIAGNOSIS — S14105S Unspecified injury at C5 level of cervical spinal cord, sequela: Secondary | ICD-10-CM | POA: Diagnosis not present

## 2021-08-30 DIAGNOSIS — Z993 Dependence on wheelchair: Secondary | ICD-10-CM | POA: Diagnosis not present

## 2021-08-30 DIAGNOSIS — I110 Hypertensive heart disease with heart failure: Secondary | ICD-10-CM | POA: Diagnosis not present

## 2021-08-30 DIAGNOSIS — E7849 Other hyperlipidemia: Secondary | ICD-10-CM | POA: Diagnosis not present

## 2021-08-31 ENCOUNTER — Other Ambulatory Visit: Payer: Self-pay

## 2021-08-31 ENCOUNTER — Encounter (HOSPITAL_COMMUNITY): Payer: Self-pay | Admitting: Emergency Medicine

## 2021-08-31 ENCOUNTER — Emergency Department (HOSPITAL_COMMUNITY): Payer: Medicare Other

## 2021-08-31 ENCOUNTER — Inpatient Hospital Stay (HOSPITAL_COMMUNITY)
Admission: EM | Admit: 2021-08-31 | Discharge: 2021-09-10 | DRG: 291 | Disposition: A | Discharge status: Home Health | Payer: Medicare Other | Attending: Internal Medicine | Admitting: Internal Medicine

## 2021-08-31 DIAGNOSIS — Z833 Family history of diabetes mellitus: Secondary | ICD-10-CM

## 2021-08-31 DIAGNOSIS — E785 Hyperlipidemia, unspecified: Secondary | ICD-10-CM | POA: Diagnosis present

## 2021-08-31 DIAGNOSIS — K604 Rectal fistula: Secondary | ICD-10-CM | POA: Diagnosis not present

## 2021-08-31 DIAGNOSIS — I493 Ventricular premature depolarization: Secondary | ICD-10-CM | POA: Diagnosis not present

## 2021-08-31 DIAGNOSIS — Z91040 Latex allergy status: Secondary | ICD-10-CM | POA: Diagnosis not present

## 2021-08-31 DIAGNOSIS — Z993 Dependence on wheelchair: Secondary | ICD-10-CM

## 2021-08-31 DIAGNOSIS — I82432 Acute embolism and thrombosis of left popliteal vein: Secondary | ICD-10-CM | POA: Diagnosis not present

## 2021-08-31 DIAGNOSIS — G8250 Quadriplegia, unspecified: Secondary | ICD-10-CM | POA: Diagnosis not present

## 2021-08-31 DIAGNOSIS — Z91199 Patient's noncompliance with other medical treatment and regimen due to unspecified reason: Secondary | ICD-10-CM

## 2021-08-31 DIAGNOSIS — K611 Rectal abscess: Secondary | ICD-10-CM | POA: Diagnosis present

## 2021-08-31 DIAGNOSIS — Z87891 Personal history of nicotine dependence: Secondary | ICD-10-CM

## 2021-08-31 DIAGNOSIS — R7989 Other specified abnormal findings of blood chemistry: Secondary | ICD-10-CM | POA: Diagnosis not present

## 2021-08-31 DIAGNOSIS — G9589 Other specified diseases of spinal cord: Secondary | ICD-10-CM | POA: Diagnosis not present

## 2021-08-31 DIAGNOSIS — I5020 Unspecified systolic (congestive) heart failure: Secondary | ICD-10-CM | POA: Diagnosis not present

## 2021-08-31 DIAGNOSIS — I248 Other forms of acute ischemic heart disease: Secondary | ICD-10-CM | POA: Diagnosis present

## 2021-08-31 DIAGNOSIS — Z7951 Long term (current) use of inhaled steroids: Secondary | ICD-10-CM

## 2021-08-31 DIAGNOSIS — M549 Dorsalgia, unspecified: Secondary | ICD-10-CM | POA: Diagnosis not present

## 2021-08-31 DIAGNOSIS — J9621 Acute and chronic respiratory failure with hypoxia: Secondary | ICD-10-CM

## 2021-08-31 DIAGNOSIS — J449 Chronic obstructive pulmonary disease, unspecified: Secondary | ICD-10-CM | POA: Diagnosis not present

## 2021-08-31 DIAGNOSIS — I428 Other cardiomyopathies: Secondary | ICD-10-CM | POA: Diagnosis not present

## 2021-08-31 DIAGNOSIS — I10 Essential (primary) hypertension: Secondary | ICD-10-CM | POA: Diagnosis present

## 2021-08-31 DIAGNOSIS — G959 Disease of spinal cord, unspecified: Secondary | ICD-10-CM | POA: Diagnosis not present

## 2021-08-31 DIAGNOSIS — Z96641 Presence of right artificial hip joint: Secondary | ICD-10-CM | POA: Diagnosis present

## 2021-08-31 DIAGNOSIS — I509 Heart failure, unspecified: Principal | ICD-10-CM

## 2021-08-31 DIAGNOSIS — G4733 Obstructive sleep apnea (adult) (pediatric): Secondary | ICD-10-CM | POA: Diagnosis not present

## 2021-08-31 DIAGNOSIS — Z6841 Body Mass Index (BMI) 40.0 and over, adult: Secondary | ICD-10-CM | POA: Diagnosis not present

## 2021-08-31 DIAGNOSIS — Z981 Arthrodesis status: Secondary | ICD-10-CM

## 2021-08-31 DIAGNOSIS — Z20822 Contact with and (suspected) exposure to covid-19: Secondary | ICD-10-CM | POA: Diagnosis present

## 2021-08-31 DIAGNOSIS — K219 Gastro-esophageal reflux disease without esophagitis: Secondary | ICD-10-CM | POA: Diagnosis not present

## 2021-08-31 DIAGNOSIS — R7303 Prediabetes: Secondary | ICD-10-CM | POA: Diagnosis present

## 2021-08-31 DIAGNOSIS — R911 Solitary pulmonary nodule: Secondary | ICD-10-CM | POA: Diagnosis present

## 2021-08-31 DIAGNOSIS — Z79899 Other long term (current) drug therapy: Secondary | ICD-10-CM

## 2021-08-31 DIAGNOSIS — I472 Ventricular tachycardia, unspecified: Secondary | ICD-10-CM | POA: Diagnosis present

## 2021-08-31 DIAGNOSIS — I5043 Acute on chronic combined systolic (congestive) and diastolic (congestive) heart failure: Secondary | ICD-10-CM | POA: Diagnosis present

## 2021-08-31 DIAGNOSIS — J9601 Acute respiratory failure with hypoxia: Secondary | ICD-10-CM | POA: Diagnosis not present

## 2021-08-31 DIAGNOSIS — G8220 Paraplegia, unspecified: Secondary | ICD-10-CM | POA: Diagnosis present

## 2021-08-31 DIAGNOSIS — Z743 Need for continuous supervision: Secondary | ICD-10-CM | POA: Diagnosis not present

## 2021-08-31 DIAGNOSIS — M6281 Muscle weakness (generalized): Secondary | ICD-10-CM | POA: Diagnosis not present

## 2021-08-31 DIAGNOSIS — I110 Hypertensive heart disease with heart failure: Secondary | ICD-10-CM | POA: Diagnosis not present

## 2021-08-31 DIAGNOSIS — J4530 Mild persistent asthma, uncomplicated: Secondary | ICD-10-CM

## 2021-08-31 DIAGNOSIS — I5023 Acute on chronic systolic (congestive) heart failure: Secondary | ICD-10-CM | POA: Diagnosis not present

## 2021-08-31 DIAGNOSIS — R6889 Other general symptoms and signs: Secondary | ICD-10-CM | POA: Diagnosis not present

## 2021-08-31 DIAGNOSIS — R0902 Hypoxemia: Secondary | ICD-10-CM | POA: Diagnosis not present

## 2021-08-31 DIAGNOSIS — J4540 Moderate persistent asthma, uncomplicated: Secondary | ICD-10-CM | POA: Diagnosis not present

## 2021-08-31 DIAGNOSIS — I499 Cardiac arrhythmia, unspecified: Secondary | ICD-10-CM | POA: Diagnosis not present

## 2021-08-31 DIAGNOSIS — Z7901 Long term (current) use of anticoagulants: Secondary | ICD-10-CM

## 2021-08-31 DIAGNOSIS — Z8249 Family history of ischemic heart disease and other diseases of the circulatory system: Secondary | ICD-10-CM

## 2021-08-31 DIAGNOSIS — R0602 Shortness of breath: Secondary | ICD-10-CM | POA: Diagnosis not present

## 2021-08-31 DIAGNOSIS — R778 Other specified abnormalities of plasma proteins: Secondary | ICD-10-CM | POA: Diagnosis not present

## 2021-08-31 LAB — BASIC METABOLIC PANEL WITH GFR
Anion gap: 7 (ref 5–15)
BUN: 9 mg/dL (ref 6–20)
CO2: 30 mmol/L (ref 22–32)
Calcium: 8.7 mg/dL — ABNORMAL LOW (ref 8.9–10.3)
Chloride: 100 mmol/L (ref 98–111)
Creatinine, Ser: 0.83 mg/dL (ref 0.61–1.24)
GFR, Estimated: >60 mL/min (ref >60)
Glucose, Bld: 101 mg/dL — ABNORMAL HIGH (ref 70–99)
Potassium: 3.9 mmol/L (ref 3.5–5.1)
Sodium: 137 mmol/L (ref 135–145)

## 2021-08-31 LAB — CBC WITH DIFFERENTIAL/PLATELET
Abs Immature Granulocytes: 0.01 10*3/uL (ref 0.00–0.07)
Basophils Absolute: 0 10*3/uL (ref 0.0–0.1)
Basophils Relative: 1 %
Eosinophils Absolute: 0.2 10*3/uL (ref 0.0–0.5)
Eosinophils Relative: 5 %
HCT: 44.8 % (ref 39.0–52.0)
Hemoglobin: 14 g/dL (ref 13.0–17.0)
Immature Granulocytes: 0 %
Lymphocytes Relative: 29 %
Lymphs Abs: 1.3 10*3/uL (ref 0.7–4.0)
MCH: 28.6 pg (ref 26.0–34.0)
MCHC: 31.3 g/dL (ref 30.0–36.0)
MCV: 91.6 fL (ref 80.0–100.0)
Monocytes Absolute: 0.7 10*3/uL (ref 0.1–1.0)
Monocytes Relative: 16 %
Neutro Abs: 2.2 10*3/uL (ref 1.7–7.7)
Neutrophils Relative %: 49 %
Platelets: 138 10*3/uL — ABNORMAL LOW (ref 150–400)
RBC: 4.89 MIL/uL (ref 4.22–5.81)
RDW: 13.6 % (ref 11.5–15.5)
WBC: 4.4 10*3/uL (ref 4.0–10.5)
nRBC: 0 % (ref 0.0–0.2)

## 2021-08-31 LAB — RESP PANEL BY RT-PCR (FLU A&B, COVID) ARPGX2
Influenza A by PCR: NEGATIVE
Influenza B by PCR: NEGATIVE
SARS Coronavirus 2 by RT PCR: NEGATIVE

## 2021-08-31 LAB — BRAIN NATRIURETIC PEPTIDE: B Natriuretic Peptide: 153 pg/mL — ABNORMAL HIGH (ref 0.0–100.0)

## 2021-08-31 LAB — TROPONIN I (HIGH SENSITIVITY)
Troponin I (High Sensitivity): 48 ng/L — ABNORMAL HIGH (ref <18)
Troponin I (High Sensitivity): 51 ng/L — ABNORMAL HIGH (ref <18)

## 2021-08-31 MED ORDER — DICLOFENAC SODIUM 1 % EX GEL
2.0000 g | Freq: Four times a day (QID) | CUTANEOUS | Status: DC | PRN
Start: 2021-08-31 — End: 2021-09-10

## 2021-08-31 MED ORDER — POTASSIUM CHLORIDE CRYS ER 20 MEQ PO TBCR
40.0000 meq | EXTENDED_RELEASE_TABLET | Freq: Two times a day (BID) | ORAL | Status: DC
  Administered 2021-08-31 – 2021-09-10 (×20): 40 meq via ORAL
  Filled 2021-08-31 (×20): qty 2

## 2021-08-31 MED ORDER — METOPROLOL SUCCINATE ER 25 MG PO TB24
25.0000 mg | ORAL_TABLET | Freq: Every day | ORAL | Status: DC
  Administered 2021-09-01: 25 mg via ORAL
  Filled 2021-08-31 (×3): qty 1

## 2021-08-31 MED ORDER — FLUTICASONE FUROATE-VILANTEROL 100-25 MCG/ACT IN AEPB
1.0000 | INHALATION_SPRAY | Freq: Every day | RESPIRATORY_TRACT | Status: DC
  Administered 2021-09-01 – 2021-09-10 (×10): 1 via RESPIRATORY_TRACT
  Filled 2021-08-31: qty 28

## 2021-08-31 MED ORDER — ALBUTEROL SULFATE HFA 108 (90 BASE) MCG/ACT IN AERS: 2.0000 | INHALATION_SPRAY | Freq: Four times a day (QID) | RESPIRATORY_TRACT | Status: DC | PRN

## 2021-08-31 MED ORDER — FUROSEMIDE 10 MG/ML IJ SOLN
40.0000 mg | Freq: Once | INTRAMUSCULAR | Status: AC
  Administered 2021-08-31: 40 mg via INTRAVENOUS
  Filled 2021-08-31: qty 4

## 2021-08-31 MED ORDER — ENOXAPARIN SODIUM 100 MG/ML IJ SOSY
100.0000 mg | PREFILLED_SYRINGE | INTRAMUSCULAR | Status: DC
  Administered 2021-08-31 – 2021-09-01 (×2): 100 mg via SUBCUTANEOUS
  Filled 2021-08-31 (×2): qty 1

## 2021-08-31 MED ORDER — FUROSEMIDE 10 MG/ML IJ SOLN
40.0000 mg | Freq: Two times a day (BID) | INTRAMUSCULAR | Status: DC
  Administered 2021-08-31 – 2021-09-04 (×8): 40 mg via INTRAVENOUS
  Filled 2021-08-31 (×8): qty 4

## 2021-08-31 MED ORDER — ONDANSETRON HCL 4 MG/2ML IJ SOLN: 4.0000 mg | Freq: Four times a day (QID) | INTRAMUSCULAR | Status: DC | PRN

## 2021-08-31 MED ORDER — ROSUVASTATIN CALCIUM 20 MG PO TABS
20.0000 mg | ORAL_TABLET | Freq: Every day | ORAL | Status: DC
  Administered 2021-08-31 – 2021-09-10 (×11): 20 mg via ORAL
  Filled 2021-08-31 (×11): qty 1

## 2021-08-31 MED ORDER — ONDANSETRON HCL 4 MG PO TABS: 4.0000 mg | ORAL_TABLET | Freq: Four times a day (QID) | ORAL | Status: DC | PRN

## 2021-08-31 MED ORDER — ALBUTEROL SULFATE (2.5 MG/3ML) 0.083% IN NEBU
2.5000 mg | INHALATION_SOLUTION | Freq: Four times a day (QID) | RESPIRATORY_TRACT | Status: DC | PRN
Start: 2021-08-31 — End: 2021-09-10

## 2021-08-31 MED ORDER — SACUBITRIL-VALSARTAN 24-26 MG PO TABS
1.0000 | ORAL_TABLET | Freq: Two times a day (BID) | ORAL | Status: DC
  Administered 2021-08-31 – 2021-09-03 (×6): 1 via ORAL
  Filled 2021-08-31 (×6): qty 1

## 2021-08-31 NOTE — ED Notes (Signed)
When in to give pt dinner tray. And pt's wife stated to me that the male wick did not work that it just became a mess so she took the male wick off.

## 2021-08-31 NOTE — ED Triage Notes (Signed)
Patient brought in via EMS from home. Alert and oriented. Airway patent. Patient c/o shortness of breath and lower back pain, sacral area pain. Per patient shortness of breath started 2 days ago. Denies any chest pain. Patient does have hx of asthma, bronchitis, CHF. Denies any increase in swelling in lower extremities or noted weight gain. Non pitting edema noted. Patient reports lower back pain has been intermittent x4-5 months. Denies any complications with BMs or urination. Patient is paraplegic. Pain is worse with palpation.
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
## 2021-08-31 NOTE — ED Notes (Signed)
Pt moved from ED stretcher to bariatric air mattress.

## 2021-08-31 NOTE — ED Provider Notes (Signed)
[facility] EMERGENCY DEPARTMENT Provider Note   CSN: [id number] Arrival date & time: 08/31/21  1017     History  Chief Complaint  Patient presents with   Shortness of Breath    Jordan Jensen is a 51 y.o. male.  Patient is a 51 year old male with past medical history of traumatic myelopathy, morbid obesity, asthma, bronchitis, sleep apnea, lymphedema, and, congestive heart failure presenting for shortness of breath.  Patient admits to shortness of breath 2 days.  Admits to minimal cough.  Denies any fevers or chills.  States his legs are chronically swollen however has not noticed any worsening of the swelling.  States he has been sleeping in a chair lift for the past 2 years with no changes.  Patient denies any recent trauma, surgeries, calf pain, new leg swelling, or history of DVT or PE.  Patient does admit to chronic immobilization secondary to paraplegia.   Shortness of Breath Associated symptoms: no abdominal pain, no chest pain, no cough, no ear pain, no fever, no rash, no sore throat and no vomiting        Home Medications Prior to Admission medications   Medication Sig Start Date End Date Taking? Authorizing Provider  acetaminophen (TYLENOL) 325 MG tablet Take 2 tablets (650 mg total) by mouth every 6 (six) hours as needed for mild pain (or Fever >/= 101). 02/11/17   Maxie Barb, MD  acetaminophen (TYLENOL) 500 MG tablet Take 500 mg by mouth every 6 (six) hours as needed for moderate pain.    [provider]  albuterol (PROVENTIL) (2.5 MG/3ML) 0.083% nebulizer solution Take 3 mLs (2.5 mg total) by nebulization every 6 (six) hours as needed for wheezing or shortness of breath. 08/31/20   Coralyn Helling, MD  albuterol (VENTOLIN HFA) 108 (90 Base) MCG/ACT inhaler Inhale 2 puffs into the lungs every 6 (six) hours as needed for wheezing or shortness of breath. 08/31/20   Coralyn Helling, MD  budesonide-formoterol (SYMBICORT) 80-4.5 MCG/ACT inhaler Inhale 2 puffs into  the lungs in the morning and at bedtime. Brush tongue and rinse mouth afterwards 12/14/20   Cobb, [name], NP  cetirizine (ZYRTEC) 10 MG tablet Take 10 mg by mouth daily as needed for allergies.    [provider]  Cyanocobalamin (VITAMIN B 12 PO) Take 1 tablet by mouth daily.    [provider]  diclofenac Sodium (VOLTAREN) 1 % GEL Apply 2 g topically 4 (four) times daily. Patient taking differently: Apply 2 g topically 4 (four) times daily as needed (pain). 05/17/21   Paseda, Baird Kay, FNP  fluticasone (FLONASE) 50 MCG/ACT nasal spray Place 2 sprays into both nostrils daily. 01/28/21   Katha Cabal, DO  furosemide (LASIX) 40 MG tablet Take 40 mg by mouth daily as needed for edema.    [provider]  metoprolol succinate (TOPROL XL) 25 MG 24 hr tablet Take 1 tablet (25 mg total) by mouth daily. 12/06/20   Strader, Lennart Pall, PA-C  Multiple Vitamins-Minerals (MULTIVITAMIN ADULTS PO) Take 1 tablet by mouth daily.    [provider]  rosuvastatin (CRESTOR) 20 MG tablet Take 1 tablet (20 mg total) by mouth daily. 05/31/21   Paseda, Baird Kay, FNP  sacubitril-valsartan (ENTRESTO) 24-26 MG Take 1 tablet by mouth 2 (two) times daily. 08/30/20   Strader, Lennart Pall, PA-C  atorvastatin (LIPITOR) 10 MG tablet Take 10 mg by mouth daily.  Patient not taking: Reported on 11/14/2019 11/30/18 12/13/19  [provider]  Allergies    Latex    Review of Systems   Review of Systems  Constitutional:  Negative for chills and fever.  HENT:  Negative for ear pain and sore throat.   Eyes:  Negative for pain and visual disturbance.  Respiratory:  Positive for shortness of breath. Negative for cough.   Cardiovascular:  Negative for chest pain and palpitations.  Gastrointestinal:  Negative for abdominal pain and vomiting.  Genitourinary:  Negative for dysuria and hematuria.  Musculoskeletal:  Negative for arthralgias and back pain.  Skin:  Negative for color  change and rash.  Neurological:  Negative for seizures and syncope.  All other systems reviewed and are negative.   Physical Exam Updated Vital Signs BP (!) 126/90   Pulse 88   Temp 97.8 F (36.6 C) (Oral)   Resp (!) 39   Ht 5\' 11"  (1.803 m)   Wt (!) 197.3 kg   SpO2 98%   BMI 60.67 kg/m  Physical Exam Vitals and nursing note reviewed.  Constitutional:      General: He is not in acute distress.    Appearance: He is well-developed. He is morbidly obese.  HENT:     Head: Normocephalic and atraumatic.  Eyes:     Conjunctiva/sclera: Conjunctivae normal.  Cardiovascular:     Rate and Rhythm: Normal rate and regular rhythm.     Heart sounds: No murmur heard. Pulmonary:     Effort: Pulmonary effort is normal. No respiratory distress.     Breath sounds: Normal breath sounds.  Abdominal:     Palpations: Abdomen is soft.     Tenderness: There is no abdominal tenderness.  Musculoskeletal:        General: No swelling.     Cervical back: Neck supple.     Right lower leg: 4+ Pitting Edema present.     Left lower leg: 4+ Pitting Edema present.  Skin:    General: Skin is warm and dry.     Capillary Refill: Capillary refill takes less than 2 seconds.  Neurological:     Mental Status: He is alert.  Psychiatric:        Mood and Affect: Mood normal.     ED Results / Procedures / Treatments   Labs (all labs ordered are listed, but only abnormal results are displayed) Labs Reviewed  CBC WITH DIFFERENTIAL/PLATELET - Abnormal; Notable for the following components:      Result Value   Platelets 138 (*)    All other components within normal limits  BASIC METABOLIC PANEL - Abnormal; Notable for the following components:   Glucose, Bld 101 (*)    Calcium 8.7 (*)    All other components within normal limits  BRAIN NATRIURETIC PEPTIDE - Abnormal; Notable for the following components:   B Natriuretic Peptide 153.0 (*)    All other components within normal limits  TROPONIN I (HIGH  SENSITIVITY) - Abnormal; Notable for the following components:   Troponin I (High Sensitivity) 48 (*)    All other components within normal limits  TROPONIN I (HIGH SENSITIVITY) - Abnormal; Notable for the following components:   Troponin I (High Sensitivity) 51 (*)    All other components within normal limits  RESP PANEL BY RT-PCR (FLU A&B, COVID) [id number]    EKG EKG Interpretation  Date/Time:  Saturday August 31 2021 11:14:22 EDT Ventricular Rate:  89 PR Interval:  160 QRS Duration: 94 QT Interval:  368 QTC Calculation: 448 R Axis:   21 Text Interpretation: Sinus  rhythm Probable left atrial enlargement Low voltage, precordial leads Nonspecific T abnormalities, lateral leads Confirmed by Edwin Dada ([id number]) on 08/31/2021 12:35:58 PM  Radiology DG Chest Portable 1 View  Result Date: 08/31/2021 CLINICAL DATA:  Shortness of breath EXAM: PORTABLE CHEST 1 VIEW COMPARISON:  11/12/2020 FINDINGS: Cardiomegaly and congestion of central vessels. There is no edema, consolidation, effusion, or pneumothorax. ACDF hardware IMPRESSION: Cardiomegaly and central vascular congestion. Electronically Signed   By: Tiburcio Pea M.D.   On: 08/31/2021 11:48    Procedures .Critical Care  Performed by: Franne Forts, DO Authorized by: Franne Forts, DO   Critical care provider statement:    Critical care time (minutes):  55   Critical care was necessary to treat or prevent imminent or life-threatening deterioration of the following conditions:  Respiratory failure   Critical care was time spent personally by me on the following activities:  Development of treatment plan with patient or surrogate, discussions with consultants, evaluation of patient's response to treatment, examination of patient, ordering and review of laboratory studies, ordering and review of radiographic studies, ordering and performing treatments and interventions, pulse oximetry, re-evaluation of patient's condition and review of old  charts   Care discussed with: admitting provider       Medications Ordered in ED Medications  furosemide (LASIX) injection 40 mg (has no administration in time range)    ED Course/ Medical Decision Making/ A&P                           Medical Decision Making Amount and/or Complexity of Data Reviewed Labs: ordered. Radiology: ordered.  Risk Prescription drug management. Decision regarding hospitalization.   21:91 PM 51 year old male with past medical history of traumatic myelopathy, morbid obesity, asthma, bronchitis, sleep apnea, lymphedema, and, congestive heart failure presenting for shortness of breath.  Patient admits to shortness of breath 2 days.  Is alert and oriented x3, no acute distress, afebrile, stable vital signs.  On physical Sam patient is very well-appearing, with no signs of respiratory distress.  Satting at 96 to 100% on room air while resting.  Patient has equal bilateral breath sounds with no adventitious lung sounds.  EKG is interpreted by myself demonstrates no acute process  Patient appears clinically fluid over loaded with pulmonary vascular congestion and elevated trops likey secondary to ischemic demand with hypoxia. Patient agreeable to plan. Accepted by admitting team.         Final Clinical Impression(s) / ED Diagnoses Final diagnoses:  Acute on chronic congestive heart failure, unspecified heart failure type (HCC)  Acute respiratory failure with hypoxia (HCC)  Elevated troponin    Rx / DC Orders ED Discharge Orders     None         Franne Forts, DO 08/31/21 1636

## 2021-08-31 NOTE — ED Notes (Signed)
Date and time results received: 08/31/21 1224 noted in results at 1234   Test: troponin  Critical Value: 48  Name of Provider Notified: Dr Wallace Cullens  Orders Received? Or Actions Taken?: Orders Received - See Orders for details

## 2021-08-31 NOTE — ED Notes (Signed)
Notified nurse that pts O2 was ranging from 88-90% , Nurse stated to put pt on 2lt Of O2  Pts O2 came up to 97%

## 2021-09-01 ENCOUNTER — Inpatient Hospital Stay (HOSPITAL_COMMUNITY): Payer: Medicare Other

## 2021-09-01 DIAGNOSIS — J9601 Acute respiratory failure with hypoxia: Secondary | ICD-10-CM

## 2021-09-01 DIAGNOSIS — Z6841 Body Mass Index (BMI) 40.0 and over, adult: Secondary | ICD-10-CM

## 2021-09-01 DIAGNOSIS — R778 Other specified abnormalities of plasma proteins: Secondary | ICD-10-CM | POA: Diagnosis not present

## 2021-09-01 DIAGNOSIS — G9589 Other specified diseases of spinal cord: Secondary | ICD-10-CM

## 2021-09-01 DIAGNOSIS — I5043 Acute on chronic combined systolic (congestive) and diastolic (congestive) heart failure: Secondary | ICD-10-CM | POA: Diagnosis not present

## 2021-09-01 DIAGNOSIS — K604 Rectal fistula: Secondary | ICD-10-CM

## 2021-09-01 DIAGNOSIS — I10 Essential (primary) hypertension: Secondary | ICD-10-CM

## 2021-09-01 LAB — ECHOCARDIOGRAM COMPLETE
Area-P 1/2: 3.65 cm2
Calc EF: 25 %
Height: 71 in
Single Plane A2C EF: 26 %
Single Plane A4C EF: 21.5 %
Weight: 7968 oz

## 2021-09-01 LAB — HIV ANTIBODY (ROUTINE TESTING W REFLEX): HIV Screen 4th Generation wRfx: NONREACTIVE

## 2021-09-01 LAB — BASIC METABOLIC PANEL
Anion gap: 6 (ref 5–15)
BUN: 8 mg/dL (ref 6–20)
CO2: 36 mmol/L — ABNORMAL HIGH (ref 22–32)
Calcium: 8.8 mg/dL — ABNORMAL LOW (ref 8.9–10.3)
Chloride: 96 mmol/L — ABNORMAL LOW (ref 98–111)
Creatinine, Ser: 0.9 mg/dL (ref 0.61–1.24)
GFR, Estimated: >60 mL/min (ref >60)
Glucose, Bld: 105 mg/dL — ABNORMAL HIGH (ref 70–99)
Potassium: 4.5 mmol/L (ref 3.5–5.1)
Sodium: 138 mmol/L (ref 135–145)

## 2021-09-01 MED ORDER — LIVING BETTER WITH HEART FAILURE BOOK: Freq: Once | Status: AC

## 2021-09-01 MED ORDER — SALINE SPRAY 0.65 % NA SOLN
1.0000 | NASAL | Status: DC | PRN
  Administered 2021-09-01: 1 via NASAL
  Filled 2021-09-01: qty 44

## 2021-09-01 MED ORDER — PERFLUTREN LIPID MICROSPHERE
1.0000 mL | INTRAVENOUS | Status: AC | PRN
  Administered 2021-09-01: 3 mL via INTRAVENOUS

## 2021-09-01 NOTE — Plan of Care (Signed)
  Problem: Education: Goal: Ability to verbalize understanding of medication therapies will improve Outcome: Progressing   

## 2021-09-01 NOTE — Progress Notes (Signed)
Echo attempted at 10:00, patient does not currently have IV access and will need for echo. Will re-attempt as schedule permits.  [facility] Jessalyn Hinojosa RDCS

## 2021-09-01 NOTE — Assessment & Plan Note (Signed)
Spouse states that the patient weighed about 435 pounds about 9 months ago. Patient appears at least 20 pounds overweight Difficult to assess fluid status given the patient's body habitus Obtain ReDs reading--unable due to weight limitations Accurate I/O--incomplete Continue IV Lasix 40 mg IV twice daily Continue metoprolol succinate Continue Entresto

## 2021-09-01 NOTE — Progress Notes (Signed)
Echocardiogram 2D Echocardiogram has been performed.  Warren Lacy Sharlynn Seckinger RDCS 09/01/2021, 12:13 PM

## 2021-09-01 NOTE — Plan of Care (Signed)

## 2021-09-01 NOTE — Progress Notes (Signed)
Living better with Heart failure book given to patient

## 2021-09-01 NOTE — Assessment & Plan Note (Signed)
Continue statin. 

## 2021-09-01 NOTE — Progress Notes (Signed)
PROGRESS NOTE  Jordan Jensen HUT:[id number] DOB: Aug 11, 1970 DOA: 08/31/2021 PCP: Donell Beers, FNP  Brief History:  Wt Readings from Last 3 Encounters:  08/01/21 (!) 480 lb (217.7 kg)  07/30/21 (!) 450 lb (204.1 kg)  06/04/21 (!) 460 lb (2.71 kg)  51 year old male with a history of traumatic myelopathy C6/7, morbid obesity, asthma, lymphedema, OSA, systolic CHF, hypertension, hyperlipidemia presenting with 3-day history of shortness of breath.  The patient's wife at the bedside who supplements the history.  She states that she has noted increasing peripheral edema and increasing abdominal girth at least for the past week.  The patient states that he has actually had worsening shortness of breath and orthopnea for the better part of at least a week.  He denies any fevers, chills, chest pain, headache, nausea, vomiting, diarrhea, abdominal pain. Patient states that he has been poorly compliant with CPAP at home.  In addition, he states that he continues to be only taking his Entresto once daily rather than twice daily.  He states that he has been taking furosemide 40 mg on a daily basis although he does withhold taking it if he has a physician appointment or has somewhere to go. In the ED, the patient will had a low-grade temperature of 99.4 F.  He was hemodynamically stable with oxygen saturation 95 to 100% on room air.  WBC 4.4, hemoglobin 14.0, platelets 230,000.  Sodium 137, potassium 3.9, bicarbonate 38, serum creatinine 0.83.  BNP was 153.0.  Chest x-ray showed increased vascular congestion.  The patient was started on IV furosemide and admitted for further evaluation and treatment.      Assessment and Plan: * Acute on chronic combined systolic and diastolic CHF (congestive heart failure) (HCC) Spouse states that the patient weighed about 435 pounds about 9 months ago. Patient appears at least 20 pounds overweight Difficult to assess fluid status given the patient's body  habitus Obtain ReDs reading Accurate I/O Continue IV Lasix 40 mg IV twice daily Continue metoprolol succinate Continue Entresto   Perirectal fistula Outpatient follow-up with general surgery  Hyperlipidemia Continue statin  OSA (obstructive sleep apnea) Poor compliance at home with CPAP I have asked the patient to bring in his home CPAP machine  Morbid obesity with BMI of 60.0-69.9, adult (HCC) Lifestyle modification BMI 69.46         Family Communication:   spouse updated at bedside 8/27  Consultants:  none  Code Status:  FULL   DVT Prophylaxis: [place] Lovenox   Procedures: As Listed in Progress Note Above  Antibiotics: None        Subjective:  Patient denies fevers, chills, headache, chest pain, nausea, vomiting, diarrhea, abdominal pain, dysuria, hematuria, hematochezia, and melena.  Objective: Vitals:   09/01/21 0054 09/01/21 0552 09/01/21 0634 09/01/21 0900  BP:  (!) 162/129 110/81   Pulse:  82 91   Resp:  18 20   Temp:  98.1 F (36.7 C) 99.4 F (37.4 C)   TempSrc:  Oral Oral   SpO2:  97% 98% 96%  Weight: (!) 220.9 kg (!) 225.9 kg    Height: 5\' 11"  (1.803 m)       Intake/Output Summary (Last 24 hours) at 09/01/2021 0955 Last data filed at 09/01/2021 0831 Gross per 24 hour  Intake 476 ml  Output 2825 ml  Net -2349 ml   Weight change:  Exam:  General:  Pt is alert, follows commands appropriately, not in  acute distress HEENT: No icterus, No thrush, No neck mass, /AT Cardiovascular: RRR, S1/S2, no rubs, no gallops Respiratory: bibasilar crackles. No wheeze Abdomen: Soft/+BS, non tender, non distended, no guarding Extremities: 1 + LE edema, No lymphangitis, No petechiae, No rashes, no synovitis   Data Reviewed: I have personally reviewed following labs and imaging studies Basic Metabolic Panel: Recent Labs  Lab 08/31/21 1144 09/01/21 0604  NA 137 138  K 3.9 4.5  CL 100 96*  CO2 30 36*  GLUCOSE 101* 105*  BUN 9 8   CREATININE 0.83 0.90  CALCIUM 8.7* 8.8*   Liver Function Tests: No results for input(s): "AST", "ALT", "ALKPHOS", "BILITOT", "PROT", "ALBUMIN" in the last 168 hours. No results for input(s): "LIPASE", "AMYLASE" in the last 168 hours. No results for input(s): "AMMONIA" in the last 168 hours. Coagulation Profile: No results for input(s): "INR", "PROTIME" in the last 168 hours. CBC: Recent Labs  Lab 08/31/21 1144  WBC 4.4  NEUTROABS 2.2  HGB 14.0  HCT 44.8  MCV 91.6  PLT 138*   Cardiac Enzymes: No results for input(s): "CKTOTAL", "CKMB", "CKMBINDEX", "TROPONINI" in the last 168 hours. BNP: Invalid input(s): "POCBNP" CBG: No results for input(s): "GLUCAP" in the last 168 hours. HbA1C: No results for input(s): "HGBA1C" in the last 72 hours. Urine analysis:    Component Value Date/Time   COLORURINE YELLOW 06/04/2021 1324   APPEARANCEUR CLEAR 06/04/2021 1324   LABSPEC 1.016 06/04/2021 1324   PHURINE 7.0 06/04/2021 1324   GLUCOSEU NEGATIVE 06/04/2021 1324   HGBUR NEGATIVE 06/04/2021 1324   BILIRUBINUR NEGATIVE 06/04/2021 1324   BILIRUBINUR negative 08/26/2020 1558   BILIRUBINUR NEG 06/13/2015 1650   KETONESUR NEGATIVE 06/04/2021 1324   PROTEINUR NEGATIVE 06/04/2021 1324   UROBILINOGEN 0.2 08/26/2020 1558   UROBILINOGEN 1.0 09/26/2018 1619   NITRITE NEGATIVE 06/04/2021 1324   LEUKOCYTESUR NEGATIVE 06/04/2021 1324   Sepsis Labs: @LABRCNTIP (procalcitonin:4,lacticidven:4) ) Recent Results (from the past 240 hour(s))  Resp Panel by RT-PCR (Flu A&B, Covid) Anterior Nasal Swab     Status: None   Collection Time: 08/31/21 12:10 PM   Specimen: Anterior Nasal Swab  Result Value Ref Range Status   SARS Coronavirus 2 by RT PCR NEGATIVE NEGATIVE Final    Comment: (NOTE) SARS-CoV-2 target nucleic acids are NOT DETECTED.  The SARS-CoV-2 RNA is generally detectable in upper respiratory specimens during the acute phase of infection. The lowest concentration of SARS-CoV-2 viral  copies this assay can detect is 138 copies/mL. A negative result does not preclude SARS-Cov-2 infection and should not be used as the sole basis for treatment or other patient management decisions. A negative result may occur with  improper specimen collection/handling, submission of specimen other than nasopharyngeal swab, presence of viral mutation(s) within the areas targeted by this assay, and inadequate number of viral copies(<138 copies/mL). A negative result must be combined with clinical observations, patient history, and epidemiological information. The expected result is Negative.  Fact Sheet for Patients:  09/02/21  Fact Sheet for Healthcare Providers:  BloggerCourse.com  This test is no t yet approved or cleared by the [employer] FDA and  has been authorized for detection and/or diagnosis of SARS-CoV-2 by FDA under an Emergency Use Authorization (EUA). This EUA will remain  in effect (meaning this test can be used) for the duration of the COVID-19 declaration under Section 564(b)(1) of the Act, 21 U.S.C.section 360bbb-3(b)(1), unless the authorization is terminated  or revoked sooner.       Influenza A by PCR  NEGATIVE NEGATIVE Final   Influenza B by PCR NEGATIVE NEGATIVE Final    Comment: (NOTE) The Xpert Xpress SARS-CoV-2/FLU/RSV plus assay is intended as an aid in the diagnosis of influenza from Nasopharyngeal swab specimens and should not be used as a sole basis for treatment. Nasal washings and aspirates are unacceptable for Xpert Xpress SARS-CoV-2/FLU/RSV testing.  Fact Sheet for Patients: EntrepreneurPulse.com.au  Fact Sheet for Healthcare Providers: IncredibleEmployment.be  This test is not yet approved or cleared by the [place] FDA and has been authorized for detection and/or diagnosis of SARS-CoV-2 by FDA under an Emergency Use Authorization (EUA). This  EUA will remain in effect (meaning this test can be used) for the duration of the COVID-19 declaration under Section 564(b)(1) of the Act, 21 U.S.C. section 360bbb-3(b)(1), unless the authorization is terminated or revoked.  Performed at [facility], [address], [place], [place] [address]      Scheduled Meds:  enoxaparin (LOVENOX) injection  100 mg Subcutaneous Q24H   fluticasone furoate-vilanterol  1 puff Inhalation Daily   furosemide  40 mg Intravenous BID   metoprolol succinate  25 mg Oral Daily   potassium chloride  40 mEq Oral BID   rosuvastatin  20 mg Oral Daily   sacubitril-valsartan  1 tablet Oral BID   Continuous Infusions:  Procedures/Studies: DG Chest Portable 1 View  Result Date: 08/31/2021 CLINICAL DATA:  Shortness of breath EXAM: PORTABLE CHEST 1 VIEW COMPARISON:  11/12/2020 FINDINGS: Cardiomegaly and congestion of central vessels. There is no edema, consolidation, effusion, or pneumothorax. ACDF hardware IMPRESSION: Cardiomegaly and central vascular congestion. Electronically Signed   By: Jorje Guild M.D.   On: 08/31/2021 11:48    Orson Eva, DO  [facility]  If 7PM-7AM, please contact night-coverage [email or web address] Password [email or web address] 09/01/2021, 9:55 AM   LOS: 1 day

## 2021-09-01 NOTE — Assessment & Plan Note (Addendum)
Outpatient follow-up with general surgery Gluteal/perianal area not grossly infected on exam

## 2021-09-01 NOTE — Assessment & Plan Note (Signed)
Lifestyle modification BMI 69.46

## 2021-09-01 NOTE — Assessment & Plan Note (Signed)
Poor compliance at home with CPAP I have asked the patient to bring in his home CPAP machine ---he is able to wear it 3-4 hours

## 2021-09-01 NOTE — Hospital Course (Addendum)
Wt Readings from Last 3 Encounters:  08/01/21 (!) 480 lb (217.7 kg)  07/30/21 (!) 450 lb (204.1 kg)  06/04/21 (!) 460 lb (49.25 kg)  51 year old male with a history of traumatic myelopathy C6/7, morbid obesity, asthma, lymphedema, OSA, systolic CHF, hypertension, hyperlipidemia presenting with 3-day history of shortness of breath.  The patient's wife at the bedside who supplements the history.  She states that she has noted increasing peripheral edema and increasing abdominal girth at least for the past week.  The patient states that he has actually had worsening shortness of breath and orthopnea for the better part of at least a week.  He denies any fevers, chills, chest pain, headache, nausea, vomiting, diarrhea, abdominal pain. Patient states that he has been poorly compliant with CPAP at home.  In addition, he states that he continues to be only taking his Entresto once daily rather than twice daily.  He states that he has been taking furosemide 40 mg on a daily basis although he does withhold taking it if he has a physician appointment or has somewhere to go. In the ED, the patient will had a low-grade temperature of 99.4 F.  He was hemodynamically stable with oxygen saturation 95 to 100% on room air.  WBC 4.4, hemoglobin 14.0, platelets 230,000.  Sodium 137, potassium 3.9, bicarbonate 38, serum creatinine 0.83.  BNP was 153.0.  Chest x-ray showed increased vascular congestion.  The patient was started on IV furosemide and admitted for further evaluation and treatment.

## 2021-09-01 NOTE — TOC Progression Note (Signed)
  Transition of Care (TOC) Screening Note   Patient Details  Name: ANTERIO SCHEEL Date of Birth: 02-20-1970   Transition of Care [facility]) CM/SW Contact:    Leitha Bleak, RN Phone Number: 09/01/2021, 11:11 AM  TOC following- Living better with CHF book order, TOC to follow up on consult and DC needs. Patient need several day to diuresis.   Transition of Care Department [facility]) has reviewed patient and no TOC needs have been identified at this time. We will continue to monitor patient advancement through interdisciplinary progression rounds. If new patient transition needs arise, please place a TOC consult.    Barriers to Discharge: Continued Medical Work up  Expected Discharge Plan and Services       Living arrangements for the past 2 months: Single Family Home

## 2021-09-02 ENCOUNTER — Inpatient Hospital Stay (HOSPITAL_COMMUNITY): Payer: Medicare Other

## 2021-09-02 DIAGNOSIS — R778 Other specified abnormalities of plasma proteins: Secondary | ICD-10-CM | POA: Diagnosis not present

## 2021-09-02 DIAGNOSIS — I5023 Acute on chronic systolic (congestive) heart failure: Secondary | ICD-10-CM | POA: Diagnosis not present

## 2021-09-02 DIAGNOSIS — I82432 Acute embolism and thrombosis of left popliteal vein: Secondary | ICD-10-CM | POA: Diagnosis not present

## 2021-09-02 DIAGNOSIS — J9601 Acute respiratory failure with hypoxia: Secondary | ICD-10-CM | POA: Diagnosis not present

## 2021-09-02 DIAGNOSIS — I5043 Acute on chronic combined systolic (congestive) and diastolic (congestive) heart failure: Secondary | ICD-10-CM | POA: Diagnosis not present

## 2021-09-02 LAB — BASIC METABOLIC PANEL
Anion gap: 6 (ref 5–15)
BUN: 13 mg/dL (ref 6–20)
CO2: 33 mmol/L — ABNORMAL HIGH (ref 22–32)
Calcium: 8.5 mg/dL — ABNORMAL LOW (ref 8.9–10.3)
Chloride: 97 mmol/L — ABNORMAL LOW (ref 98–111)
Creatinine, Ser: 1.11 mg/dL (ref 0.61–1.24)
GFR, Estimated: >60 mL/min (ref >60)
Glucose, Bld: 142 mg/dL — ABNORMAL HIGH (ref 70–99)
Potassium: 3.9 mmol/L (ref 3.5–5.1)
Sodium: 136 mmol/L (ref 135–145)

## 2021-09-02 LAB — D-DIMER, QUANTITATIVE: D-Dimer, Quant: 1.83 ug/mL-FEU — ABNORMAL HIGH (ref 0.00–0.50)

## 2021-09-02 MED ORDER — IOHEXOL 350 MG/ML SOLN
100.0000 mL | Freq: Once | INTRAVENOUS | Status: AC | PRN
  Administered 2021-09-02: 100 mL via INTRAVENOUS

## 2021-09-02 MED ORDER — APIXABAN 5 MG PO TABS
5.0000 mg | ORAL_TABLET | Freq: Two times a day (BID) | ORAL | Status: DC
  Administered 2021-09-09 – 2021-09-10 (×3): 5 mg via ORAL
  Filled 2021-09-02 (×5): qty 1

## 2021-09-02 MED ORDER — APIXABAN 5 MG PO TABS
10.0000 mg | ORAL_TABLET | Freq: Two times a day (BID) | ORAL | Status: AC
  Administered 2021-09-02 – 2021-09-08 (×14): 10 mg via ORAL
  Filled 2021-09-02 (×14): qty 2

## 2021-09-02 NOTE — Discharge Instructions (Addendum)
Information on my medicine - ELIQUIS (apixaban)  This medication education was reviewed with me or my healthcare representative as part of my discharge preparation.  The pharmacist that spoke with me during my hospital stay was:  Tad Moore, [facility]  Why was Eliquis prescribed for you? Eliquis was prescribed to treat blood clots that may have been found in the veins of your legs (deep vein thrombosis) or in your lungs (pulmonary embolism) and to reduce the risk of them occurring again.  What do You need to know about Eliquis ? The starting dose is 10 mg (two 5 mg tablets) taken TWICE daily for the FIRST SEVEN (7) DAYS, then on 09/09/2021 the dose is reduced to ONE 5 mg tablet taken TWICE daily.  Eliquis may be taken with or without food.   Try to take the dose about the same time in the morning and in the evening. If you have difficulty swallowing the tablet whole please discuss with your pharmacist how to take the medication safely.  Take Eliquis exactly as prescribed and DO NOT stop taking Eliquis without talking to the doctor who prescribed the medication.  Stopping may increase your risk of developing a new blood clot.  Refill your prescription before you run out.  After discharge, you should have regular check-up appointments with your healthcare provider that is prescribing your Eliquis.    What do you do if you miss a dose? If a dose of ELIQUIS is not taken at the scheduled time, take it as soon as possible on the same day and twice-daily administration should be resumed. The dose should not be doubled to make up for a missed dose.  Important Safety Information A possible side effect of Eliquis is bleeding. You should call your healthcare provider right away if you experience any of the following: Bleeding from an injury or your nose that does not stop. Unusual colored urine (red or dark brown) or unusual colored stools (red or black). Unusual bruising for unknown reasons. A  serious fall or if you hit your head (even if there is no bleeding).  Some medicines may interact with Eliquis and might increase your risk of bleeding or clotting while on Eliquis. To help avoid this, consult your healthcare provider or pharmacist prior to using any new prescription or non-prescription medications, including herbals, vitamins, non-steroidal anti-inflammatory drugs (NSAIDs) and supplements.  This website has more information on Eliquis (apixaban): http://www.eliquis.com/eliquis/home

## 2021-09-02 NOTE — Progress Notes (Signed)
Call received from central tele that pt ran 8 round of non sustained... On assessment pt lying in bed with CPAP off. Pt denied any pain or discomfort, pt stated that the O2 was " drying his nose out" O2 water added to the [place] at 2L. O2 SAT 96%.  Will continue to monitor. Pt's wife at bedside. Call bell placed in reach

## 2021-09-02 NOTE — Assessment & Plan Note (Signed)
Outpatient surveillance 

## 2021-09-02 NOTE — Assessment & Plan Note (Signed)
Due demand ischemia is setting of decompensated CHF -no chest pain reported - Prior Myoview Lexiscan with no ischemia

## 2021-09-02 NOTE — Progress Notes (Signed)
PROGRESS NOTE  Jordan Jensen [id number] DOB: [birth year] DOA: 08/31/2021 PCP: Renee Rival, FNP  Brief History:  Wt Readings from Last 3 Encounters:  08/01/21 (!) 480 lb (217.7 kg)  07/30/21 (!) 450 lb (204.1 kg)  06/04/21 (!) 460 lb (72.55 kg)  51 year old male with a history of traumatic myelopathy C6/7, morbid obesity, asthma, lymphedema, OSA, systolic CHF, hypertension, hyperlipidemia presenting with 3-day history of shortness of breath.  The patient's wife at the bedside who supplements the history.  She states that she has noted increasing peripheral edema and increasing abdominal girth at least for the past week.  The patient states that he has actually had worsening shortness of breath and orthopnea for the better part of at least a week.  He denies any fevers, chills, chest pain, headache, nausea, vomiting, diarrhea, abdominal pain. Patient states that he has been poorly compliant with CPAP at home.  In addition, he states that he continues to be only taking his Entresto once daily rather than twice daily.  He states that he has been taking furosemide 40 mg on a daily basis although he does withhold taking it if he has a physician appointment or has somewhere to go. In the ED, the patient will had a low-grade temperature of 99.4 F.  He was hemodynamically stable with oxygen saturation 95 to 100% on room air.  WBC 4.4, hemoglobin 14.0, platelets 230,000.  Sodium 137, potassium 3.9, bicarbonate 38, serum creatinine 0.83.  BNP was 153.0.  Chest x-ray showed increased vascular congestion.  The patient was started on IV furosemide and admitted for further evaluation and treatment.     Assessment and Plan: * Acute on chronic combined systolic and diastolic CHF (congestive heart failure) (HCC) Spouse states that the patient weighed about 435 pounds about 9 months ago. Patient appears at least 20 pounds overweight Difficult to assess fluid status given the patient's body  habitus Obtain ReDs reading--unable due to weight limitations Accurate I/O--incomplete Continue IV Lasix 40 mg IV twice daily Continue metoprolol succinate Continue Entresto   Acute deep vein thrombosis (DVT) of popliteal vein of left lower extremity (HCC) Start apixaban CTA chest--no PE  Elevated troponin Due demand ischemia is setting of decompensated CHF -no chest pain reported - Prior Myoview Lexiscan with no ischemia  Perirectal fistula Outpatient follow-up with general surgery Gluteal/perianal area not grossly infected on exam  Hyperlipidemia Continue statin  OSA (obstructive sleep apnea) Poor compliance at home with CPAP I have asked the patient to bring in his home CPAP machine ---he is able to wear it 3-4 hours  Morbid obesity with BMI of 60.0-69.9, adult (HCC) Lifestyle modification BMI 69.46  Pulmonary nodule, left Outpatient surveillance        Family Communication:   spouse updated 8/27  Consultants:  cardiology  Code Status:  FULL   DVT Prophylaxis:  apixaban   Procedures: As Listed in Progress Note Above  Antibiotics: None      Subjective: Pt states he is breathing better.  Denies f/c, cp, n/v/d abd pain.  Objective: Vitals:   09/01/21 2209 09/02/21 0500 09/02/21 0713 09/02/21 0801  BP:    108/83  Pulse: 80   (!) 44  Resp: 20     Temp:    99.2 F (37.3 C)  TempSrc:      SpO2: 93%  94% 94%  Weight:  (!) 200.9 kg    Height:  Intake/Output Summary (Last 24 hours) at 09/02/2021 1607 Last data filed at 09/02/2021 1415 Gross per 24 hour  Intake 476 ml  Output 1500 ml  Net -1024 ml   Weight change: 3.629 kg Exam:  General:  Pt is alert, follows commands appropriately, not in acute distress HEENT: No icterus, No thrush, No neck mass, [place]/AT Cardiovascular: RRR, S1/S2, no rubs, no gallops Respiratory: bibasilar crackles. Abdomen: Soft/+BS, non tender, non distended, no guarding Extremities: Nonpitting edema, No  lymphangitis, No petechiae, No rashes, no synovitis;  Buttock, perianal region without pus, induration or necrosis   Data Reviewed: I have personally reviewed following labs and imaging studies Basic Metabolic Panel: Recent Labs  Lab 08/31/21 1144 09/01/21 0604 09/02/21 0446  NA 137 138 136  K 3.9 4.5 3.9  CL 100 96* 97*  CO2 30 36* 33*  GLUCOSE 101* 105* 142*  BUN 9 8 13   CREATININE 0.83 0.90 1.11  CALCIUM 8.7* 8.8* 8.5*   Liver Function Tests: No results for input(s): "AST", "ALT", "ALKPHOS", "BILITOT", "PROT", "ALBUMIN" in the last 168 hours. No results for input(s): "LIPASE", "AMYLASE" in the last 168 hours. No results for input(s): "AMMONIA" in the last 168 hours. Coagulation Profile: No results for input(s): "INR", "PROTIME" in the last 168 hours. CBC: Recent Labs  Lab 08/31/21 1144  WBC 4.4  NEUTROABS 2.2  HGB 14.0  HCT 44.8  MCV 91.6  PLT 138*   Cardiac Enzymes: No results for input(s): "CKTOTAL", "CKMB", "CKMBINDEX", "TROPONINI" in the last 168 hours. BNP: Invalid input(s): "POCBNP" CBG: No results for input(s): "GLUCAP" in the last 168 hours. HbA1C: No results for input(s): "HGBA1C" in the last 72 hours. Urine analysis:    Component Value Date/Time   COLORURINE YELLOW 06/04/2021 1324   APPEARANCEUR CLEAR 06/04/2021 1324   LABSPEC 1.016 06/04/2021 1324   PHURINE 7.0 06/04/2021 1324   GLUCOSEU NEGATIVE 06/04/2021 1324   HGBUR NEGATIVE 06/04/2021 1324   BILIRUBINUR NEGATIVE 06/04/2021 1324   BILIRUBINUR negative 08/26/2020 1558   BILIRUBINUR NEG 06/13/2015 1650   KETONESUR NEGATIVE 06/04/2021 1324   PROTEINUR NEGATIVE 06/04/2021 1324   UROBILINOGEN 0.2 08/26/2020 1558   UROBILINOGEN 1.0 09/26/2018 1619   NITRITE NEGATIVE 06/04/2021 1324   LEUKOCYTESUR NEGATIVE 06/04/2021 1324   Sepsis Labs: @LABRCNTIP (procalcitonin:4,lacticidven:4) ) Recent Results (from the past 240 hour(s))  Resp Panel by RT-PCR (Flu A&B, Covid) Anterior Nasal Swab      Status: None   Collection Time: 08/31/21 12:10 PM   Specimen: Anterior Nasal Swab  Result Value Ref Range Status   SARS Coronavirus 2 by RT PCR NEGATIVE NEGATIVE Final    Comment: (NOTE) SARS-CoV-2 target nucleic acids are NOT DETECTED.  The SARS-CoV-2 RNA is generally detectable in upper respiratory specimens during the acute phase of infection. The lowest concentration of SARS-CoV-2 viral copies this assay can detect is 138 copies/mL. A negative result does not preclude SARS-Cov-2 infection and should not be used as the sole basis for treatment or other patient management decisions. A negative result may occur with  improper specimen collection/handling, submission of specimen other than nasopharyngeal swab, presence of viral mutation(s) within the areas targeted by this assay, and inadequate number of viral copies(<138 copies/mL). A negative result must be combined with clinical observations, patient history, and epidemiological information. The expected result is Negative.  Fact Sheet for Patients:  BloggerCourse.com  Fact Sheet for Healthcare Providers:  SeriousBroker.it  This test is no t yet approved or cleared by the [place] FDA and  has been  authorized for detection and/or diagnosis of SARS-CoV-2 by FDA under an Emergency Use Authorization (EUA). This EUA will remain  in effect (meaning this test can be used) for the duration of the COVID-19 declaration under Section 564(b)(1) of the Act, 21 U.S.C.section 360bbb-3(b)(1), unless the authorization is terminated  or revoked sooner.       Influenza A by PCR NEGATIVE NEGATIVE Final   Influenza B by PCR NEGATIVE NEGATIVE Final    Comment: (NOTE) The Xpert Xpress SARS-CoV-2/FLU/RSV plus assay is intended as an aid in the diagnosis of influenza from Nasopharyngeal swab specimens and should not be used as a sole basis for treatment. Nasal washings and aspirates are  unacceptable for Xpert Xpress SARS-CoV-2/FLU/RSV testing.  Fact Sheet for Patients: BloggerCourse.com  Fact Sheet for Healthcare Providers: SeriousBroker.it  This test is not yet approved or cleared by the [place] FDA and has been authorized for detection and/or diagnosis of SARS-CoV-2 by FDA under an Emergency Use Authorization (EUA). This EUA will remain in effect (meaning this test can be used) for the duration of the COVID-19 declaration under Section 564(b)(1) of the Act, 21 U.S.C. section 360bbb-3(b)(1), unless the authorization is terminated or revoked.  Performed at [facility], [address], [place], [place] [address]      Scheduled Meds:  apixaban  10 mg Oral BID   Followed by   Melene Muller ON 09/09/2021] apixaban  5 mg Oral BID   fluticasone furoate-vilanterol  1 puff Inhalation Daily   furosemide  40 mg Intravenous BID   metoprolol succinate  25 mg Oral Daily   potassium chloride  40 mEq Oral BID   rosuvastatin  20 mg Oral Daily   sacubitril-valsartan  1 tablet Oral BID   Continuous Infusions:  Procedures/Studies: CT Angio Chest Pulmonary Embolism (PE) W or WO Contrast  Addendum Date: 09/02/2021   ADDENDUM REPORT: 09/02/2021 15:49 ADDENDUM: Following should be added to the impression. There is a 5 mm nodule in left lower lung field. No follow-up needed if patient is low-risk.This recommendation follows the consensus statement: Guidelines for Management of Incidental Pulmonary Nodules Detected on CT Images: From the Fleischner Society 2017; Radiology 2017; 284:228-243. Electronically Signed   By: Ernie Avena M.D.   On: 09/02/2021 15:49   Result Date: 09/02/2021 CLINICAL DATA:  DVT lower extremity, positive D-dimer EXAM: CT ANGIOGRAPHY CHEST WITH CONTRAST TECHNIQUE: Multidetector CT imaging of the chest was performed using the standard protocol during bolus administration of intravenous contrast. Multiplanar CT  image reconstructions and MIPs were obtained to evaluate the vascular anatomy. RADIATION DOSE REDUCTION: This exam was performed according to the departmental dose-optimization program which includes automated exposure control, adjustment of the mA and/or kV according to patient size and/or use of iterative reconstruction technique. CONTRAST:  OMNIPAQUE IOHEXOL 350 MG/ML SOLN COMPARISON:  Previous studies including the chest radiograph done on 08/31/2021 FINDINGS: Cardiovascular: Heart is enlarged in size. The dilation of left ventricle. There is homogeneous enhancement in thoracic aorta. There are no intraluminal filling defects in central pulmonary artery branches. Evaluation of small peripheral branches in the lower lung fields is limited by infiltrates. Mediastinum/Nodes: Unremarkable. Lungs/Pleura: There are small linear patchy densities in both lower lung fields suggesting subsegmental atelectasis/pneumonia. In image 91 of series 6, 6 mm nodule is seen in left lower lobe. Minimal bilateral pleural effusions are seen there is no pneumothorax. Upper Abdomen: There is fatty infiltration in liver. Musculoskeletal: No acute findings are seen. Review of the MIP images confirms the above findings.  IMPRESSION: There is no evidence of central pulmonary artery embolism. Evaluation of small peripheral branches in the lower lung fields is limited by infiltrates. There is no evidence of thoracic aortic dissection. Small linear patchy densities in the lower lung fields suggest subsegmental atelectasis/pneumonia. Cardiomegaly.  Fatty liver. Electronically Signed: By: Ernie Avena M.D. On: 09/02/2021 15:02   US Venous Img Lower Bilateral (DVT)  Result Date: 09/02/2021 CLINICAL DATA:  Short of breath for 4 days, bilateral lower extremity edema EXAM: BILATERAL LOWER EXTREMITY VENOUS DOPPLER ULTRASOUND TECHNIQUE: Gray-scale sonography with graded compression, as well as color Doppler and duplex ultrasound were  performed to evaluate the lower extremity deep venous systems from the level of the common femoral vein and including the common femoral, femoral, profunda femoral, popliteal and calf veins including the posterior tibial, peroneal and gastrocnemius veins when visible. The superficial great saphenous vein was also interrogated. Spectral Doppler was utilized to evaluate flow at rest and with distal augmentation maneuvers in the common femoral, femoral and popliteal veins. COMPARISON:  None Available. FINDINGS: RIGHT LOWER EXTREMITY Common Femoral Vein: No evidence of thrombus. Normal compressibility, respiratory phasicity and response to augmentation. Saphenofemoral Junction: No evidence of thrombus. Normal compressibility and flow on color Doppler imaging. Profunda Femoral Vein: No evidence of thrombus. Normal compressibility and flow on color Doppler imaging. Femoral Vein: No evidence of thrombus. Normal compressibility, respiratory phasicity and response to augmentation. Popliteal Vein: No evidence of thrombus. Normal compressibility, respiratory phasicity and response to augmentation. Calf Veins: No gross evidence of thrombus. Evaluation limited due to body habitus and edema. Superficial Great Saphenous Vein: No evidence of thrombus. Normal compressibility. Other Findings:  None. LEFT LOWER EXTREMITY Common Femoral Vein: No evidence of thrombus. Normal compressibility, respiratory phasicity and response to augmentation. Saphenofemoral Junction: There is a small adherent thrombus at the saphenofemoral junction extending into the common femoral vein, which has an appearance most consistent with chronic DVT. No occlusive thrombus is identified. Profunda Femoral Vein: No evidence of thrombus. Normal compressibility and flow on color Doppler imaging. Femoral Vein: No evidence of thrombus. Normal compressibility, respiratory phasicity and response to augmentation. Popliteal Vein: Occlusive thrombus within the left  popliteal vein. The vessel is noncompressible, with no color flow identified. Calf Veins: Limited evaluation due to body habitus and subcutaneous edema. Superficial Great Saphenous Vein: No evidence of thrombus. Normal compressibility. Other Findings:  None. IMPRESSION: 1. Occlusive acute deep venous thrombosis of the left popliteal vein. 2. Suspected chronic nonocclusive thrombus within the left common femoral vein at the saphenofemoral junction. 3. No evidence of deep venous thrombosis within the right lower extremity. 4. Limited evaluation of the calf vessels due to body habitus and subcutaneous edema. These results will be called to the ordering clinician or representative by the Radiologist Assistant, and communication documented in the PACS or [employer]. Electronically Signed   By: Sharlet Salina M.D.   On: 09/02/2021 12:32   ECHOCARDIOGRAM COMPLETE  Result Date: 09/01/2021    ECHOCARDIOGRAM REPORT   Patient Name:   KAID SEEBERGER Date of Exam: 09/01/2021 Medical Rec #:  [id number]     Height:       71.0 in Accession #:    [id number]    Weight:       498.0 lb Date of Birth:  [birth year]      BSA:          3.107 m Patient Age:    51 years      BP:  110/81 mmHg Patient Gender: M             HR:           85 bpm. Exam Location:  [facility] Procedure: 2D Echo, Color Doppler, Cardiac Doppler and Intracardiac            Opacification Agent Indications:    I50.9* Heart failure (unspecified)  History:        Patient has prior history of Echocardiogram examinations, most                 recent 03/06/2020. Risk Factors:Hypertension, Dyslipidemia and                 Sleep Apnea.  Sonographer:    Raquel Sarna Senior RDCS Referring Phys: [phone] JACOB J STINSON  Sonographer Comments: Technically difficult study due to poor echo windows. Technically difficult study with very poor echo windows due to morbid obesity. IMPRESSIONS  1. Very technically difficult study with limited valve evaluation, Definity contrast given.   2. Left ventricular ejection fraction, by estimation, is 20 to 25%. Left ventricular ejection fraction by 2D MOD biplane is 25.0 %. The left ventricle has severely decreased function. The left ventricle demonstrates global hypokinesis. The left ventricular internal cavity size was moderately dilated. Left ventricular diastolic parameters are consistent with Grade I diastolic dysfunction (impaired relaxation).  3. Right ventricular systolic function was not well visualized. The right ventricular size is not well visualized.  4. The mitral valve was not well visualized. No evidence of mitral valve regurgitation.  5. The aortic valve was not well visualized. Aortic valve regurgitation is not visualized.  6. The inferior vena cava is dilated in size with <50% respiratory variability, suggesting right atrial pressure of 15 mmHg. Comparison(s): Changes from prior study are noted. 03/06/2020: LVEF 30-40%. FINDINGS  Left Ventricle: Left ventricular ejection fraction, by estimation, is 20 to 25%. Left ventricular ejection fraction by 2D MOD biplane is 25.0 %. The left ventricle has severely decreased function. The left ventricle demonstrates global hypokinesis. Definity contrast agent was given IV to delineate the left ventricular endocardial borders. The left ventricular internal cavity size was moderately dilated. Suboptimal image quality limits for assessment of left ventricular hypertrophy. Left ventricular  diastolic parameters are consistent with Grade I diastolic dysfunction (impaired relaxation). Indeterminate filling pressures. Right Ventricle: The right ventricular size is not well visualized. Right vetricular wall thickness was not well visualized. Right ventricular systolic function was not well visualized. Left Atrium: Left atrial size was normal in size. Right Atrium: Right atrial size was normal in size. Pericardium: There is no evidence of pericardial effusion. Mitral Valve: The mitral valve was not well  visualized. No evidence of mitral valve regurgitation. Tricuspid Valve: The tricuspid valve is not well visualized. Tricuspid valve regurgitation is not demonstrated. Aortic Valve: The aortic valve was not well visualized. Aortic valve regurgitation is not visualized. Pulmonic Valve: The pulmonic valve was not well visualized. Pulmonic valve regurgitation is not visualized. Aorta: The aortic root and ascending aorta are structurally normal, with no evidence of dilitation. Venous: The inferior vena cava is dilated in size with less than 50% respiratory variability, suggesting right atrial pressure of 15 mmHg. IAS/Shunts: No atrial level shunt detected by color flow Doppler.  LEFT VENTRICLE PLAX 2D                        Biplane EF (MOD) LVOT diam:     2.30 cm  LV Biplane EF:   Left LV SV:         72                               ventricular LV SV Index:   23                               ejection LVOT Area:     4.15 cm                         fraction by                                                 2D MOD                                                 biplane is LV Volumes (MOD)                                25.0 %. LV vol d, MOD    192.0 ml A2C:                           Diastology LV vol d, MOD    237.0 ml      LV e' medial:    5.11 cm/s A4C:                           LV E/e' medial:  12.7 LV vol s, MOD    142.0 ml      LV e' lateral:   5.11 cm/s A2C:                           LV E/e' lateral: 12.7 LV vol s, MOD    186.0 ml A4C: LV SV MOD A2C:   50.0 ml LV SV MOD A4C:   237.0 ml LV SV MOD BP:    56.7 ml RIGHT VENTRICLE RV S prime:     12.60 cm/s TAPSE (M-mode): 2.3 cm LEFT ATRIUM             Index        RIGHT ATRIUM           Index LA Vol (A2C):   85.8 ml 27.61 ml/m  RA Area:     22.80 cm LA Vol (A4C):   70.2 ml 22.59 ml/m  RA Volume:   73.80 ml  23.75 ml/m LA Biplane Vol: 80.7 ml 25.97 ml/m  AORTIC VALVE LVOT Vmax:   106.00 cm/s LVOT Vmean:  66.200 cm/s LVOT VTI:    0.174 m  AORTA Ao Root diam:  3.40 cm MITRAL VALVE MV Area (PHT): 3.65 cm    SHUNTS MV Decel Time: 208 msec    Systemic VTI:  0.17 m MV E velocity: 65.10 cm/s  Systemic Diam: 2.30 cm MV A velocity: 84.40 cm/s MV E/A ratio:  0.77  Lyman Bishop MD Electronically signed by Lyman Bishop MD Signature Date/Time: 09/01/2021/12:30:05 PM    Final    DG Chest Portable 1 View  Result Date: 08/31/2021 CLINICAL DATA:  Shortness of breath EXAM: PORTABLE CHEST 1 VIEW COMPARISON:  11/12/2020 FINDINGS: Cardiomegaly and congestion of central vessels. There is no edema, consolidation, effusion, or pneumothorax. ACDF hardware IMPRESSION: Cardiomegaly and central vascular congestion. Electronically Signed   By: Jorje Guild M.D.   On: 08/31/2021 11:48    Orson Eva, DO  [facility]  If 7PM-7AM, please contact night-coverage [email or web address] Password [email or web address] 09/02/2021, 4:07 PM   LOS: 2 days
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
## 2021-09-02 NOTE — Assessment & Plan Note (Addendum)
Started apixaban 8/28 CTA chest--no PE

## 2021-09-03 DIAGNOSIS — I82432 Acute embolism and thrombosis of left popliteal vein: Secondary | ICD-10-CM | POA: Diagnosis not present

## 2021-09-03 DIAGNOSIS — J9601 Acute respiratory failure with hypoxia: Secondary | ICD-10-CM | POA: Diagnosis not present

## 2021-09-03 DIAGNOSIS — I5043 Acute on chronic combined systolic (congestive) and diastolic (congestive) heart failure: Secondary | ICD-10-CM | POA: Diagnosis not present

## 2021-09-03 DIAGNOSIS — R778 Other specified abnormalities of plasma proteins: Secondary | ICD-10-CM | POA: Diagnosis not present

## 2021-09-03 LAB — BASIC METABOLIC PANEL
Anion gap: 7 (ref 5–15)
BUN: 12 mg/dL (ref 6–20)
CO2: 33 mmol/L — ABNORMAL HIGH (ref 22–32)
Calcium: 8.7 mg/dL — ABNORMAL LOW (ref 8.9–10.3)
Chloride: 97 mmol/L — ABNORMAL LOW (ref 98–111)
Creatinine, Ser: 0.93 mg/dL (ref 0.61–1.24)
GFR, Estimated: >60 mL/min (ref >60)
Glucose, Bld: 98 mg/dL (ref 70–99)
Potassium: 4.1 mmol/L (ref 3.5–5.1)
Sodium: 137 mmol/L (ref 135–145)

## 2021-09-03 NOTE — Progress Notes (Addendum)
Rounding Note    Patient Name: Jordan Jensen Date of Encounter: 09/03/2021  [facility] Cardiologist:   Subjective   UOP -3.2L. kidney function stable. Breathing improving, he did not use his CPAP last night. No chest pain.   Inpatient Medications    Scheduled Meds:  apixaban  10 mg Oral BID   Followed by   Melene Muller ON 09/09/2021] apixaban  5 mg Oral BID   fluticasone furoate-vilanterol  1 puff Inhalation Daily   furosemide  40 mg Intravenous BID   metoprolol succinate  25 mg Oral Daily   potassium chloride  40 mEq Oral BID   rosuvastatin  20 mg Oral Daily   sacubitril-valsartan  1 tablet Oral BID   Continuous Infusions:  PRN Meds: albuterol, diclofenac Sodium, ondansetron **OR** ondansetron (ZOFRAN) IV, sodium chloride   Vital Signs    Vitals:   09/03/21 0500 09/03/21 0534 09/03/21 0706 09/03/21 0814  BP:  (!) 85/65  (!) 100/47  Pulse:  74  (!) 42  Resp:  18    Temp:  (!) 97.2 F (36.2 C)  97.8 F (36.6 C)  TempSrc:      SpO2:  94% 94% 93%  Weight: (!) 192.3 kg     Height:        Intake/Output Summary (Last 24 hours) at 09/03/2021 0848 Last data filed at 09/03/2021 1610 Gross per 24 hour  Intake 240 ml  Output 3500 ml  Net -3260 ml      09/03/2021    5:00 AM 09/02/2021    5:00 AM 09/01/2021    5:52 AM  Last 3 Weights  Weight (lbs) 424 lb 443 lb 498 lb  Weight (kg) 192.325 kg 200.943 kg 225.891 kg      Telemetry    NSR, PVCs, HR 70s - Personally Reviewed  ECG    No new - Personally Reviewed  Physical Exam   GEN: No acute distress.   Neck: No JVD Cardiac: RRR, no murmurs, rubs, or gallops.  Respiratory: Clear to auscultation bilaterally. GI: Soft, nontender, non-distended  MS: No edema; No deformity. Neuro:  Nonfocal  Psych: Normal affect   Labs    High Sensitivity Troponin:   Recent Labs  Lab 08/31/21 1144 08/31/21 1341  TROPONINIHS 48* 51*     Chemistry Recent Labs  Lab 09/01/21 0604 09/02/21 0446 09/03/21 0348  NA  138 136 137  K 4.5 3.9 4.1  CL 96* 97* 97*  CO2 36* 33* 33*  GLUCOSE 105* 142* 98  BUN 8 13 12   CREATININE 0.90 1.11 0.93  CALCIUM 8.8* 8.5* 8.7*  GFRNONAA >60 >60 >60  ANIONGAP 6 6 7     Lipids No results for input(s): "CHOL", "TRIG", "HDL", "LABVLDL", "LDLCALC", "CHOLHDL" in the last 168 hours.  Hematology Recent Labs  Lab 08/31/21 1144  WBC 4.4  RBC 4.89  HGB 14.0  HCT 44.8  MCV 91.6  MCH 28.6  MCHC 31.3  RDW 13.6  PLT 138*   Thyroid No results for input(s): "TSH", "FREET4" in the last 168 hours.  BNP Recent Labs  Lab 08/31/21 1144  BNP 153.0*    DDimer  Recent Labs  Lab 09/02/21 0446  DDIMER 1.83*     Radiology    CT Angio Chest Pulmonary Embolism (PE) W or WO Contrast  Addendum Date: 09/02/2021   ADDENDUM REPORT: 09/02/2021 15:49 ADDENDUM: Following should be added to the impression. There is a 5 mm nodule in left lower lung field. No follow-up needed  if patient is low-risk.This recommendation follows the consensus statement: Guidelines for Management of Incidental Pulmonary Nodules Detected on CT Images: From the Fleischner Society 2017; Radiology 2017; 284:228-243. Electronically Signed   By: Ernie Avena M.D.   On: 09/02/2021 15:49   Result Date: 09/02/2021 CLINICAL DATA:  DVT lower extremity, positive D-dimer EXAM: CT ANGIOGRAPHY CHEST WITH CONTRAST TECHNIQUE: Multidetector CT imaging of the chest was performed using the standard protocol during bolus administration of intravenous contrast. Multiplanar CT image reconstructions and MIPs were obtained to evaluate the vascular anatomy. RADIATION DOSE REDUCTION: This exam was performed according to the departmental dose-optimization program which includes automated exposure control, adjustment of the mA and/or kV according to patient size and/or use of iterative reconstruction technique. CONTRAST:  OMNIPAQUE IOHEXOL 350 MG/ML SOLN COMPARISON:  Previous studies including the chest radiograph done on  08/31/2021 FINDINGS: Cardiovascular: Heart is enlarged in size. The dilation of left ventricle. There is homogeneous enhancement in thoracic aorta. There are no intraluminal filling defects in central pulmonary artery branches. Evaluation of small peripheral branches in the lower lung fields is limited by infiltrates. Mediastinum/Nodes: Unremarkable. Lungs/Pleura: There are small linear patchy densities in both lower lung fields suggesting subsegmental atelectasis/pneumonia. In image 91 of series 6, 6 mm nodule is seen in left lower lobe. Minimal bilateral pleural effusions are seen there is no pneumothorax. Upper Abdomen: There is fatty infiltration in liver. Musculoskeletal: No acute findings are seen. Review of the MIP images confirms the above findings. IMPRESSION: There is no evidence of central pulmonary artery embolism. Evaluation of small peripheral branches in the lower lung fields is limited by infiltrates. There is no evidence of thoracic aortic dissection. Small linear patchy densities in the lower lung fields suggest subsegmental atelectasis/pneumonia. Cardiomegaly.  Fatty liver. Electronically Signed: By: Ernie Avena M.D. On: 09/02/2021 15:02   US Venous Img Lower Bilateral (DVT)  Result Date: 09/02/2021 CLINICAL DATA:  Short of breath for 4 days, bilateral lower extremity edema EXAM: BILATERAL LOWER EXTREMITY VENOUS DOPPLER ULTRASOUND TECHNIQUE: Gray-scale sonography with graded compression, as well as color Doppler and duplex ultrasound were performed to evaluate the lower extremity deep venous systems from the level of the common femoral vein and including the common femoral, femoral, profunda femoral, popliteal and calf veins including the posterior tibial, peroneal and gastrocnemius veins when visible. The superficial great saphenous vein was also interrogated. Spectral Doppler was utilized to evaluate flow at rest and with distal augmentation maneuvers in the common femoral, femoral  and popliteal veins. COMPARISON:  None Available. FINDINGS: RIGHT LOWER EXTREMITY Common Femoral Vein: No evidence of thrombus. Normal compressibility, respiratory phasicity and response to augmentation. Saphenofemoral Junction: No evidence of thrombus. Normal compressibility and flow on color Doppler imaging. Profunda Femoral Vein: No evidence of thrombus. Normal compressibility and flow on color Doppler imaging. Femoral Vein: No evidence of thrombus. Normal compressibility, respiratory phasicity and response to augmentation. Popliteal Vein: No evidence of thrombus. Normal compressibility, respiratory phasicity and response to augmentation. Calf Veins: No gross evidence of thrombus. Evaluation limited due to body habitus and edema. Superficial Great Saphenous Vein: No evidence of thrombus. Normal compressibility. Other Findings:  None. LEFT LOWER EXTREMITY Common Femoral Vein: No evidence of thrombus. Normal compressibility, respiratory phasicity and response to augmentation. Saphenofemoral Junction: There is a small adherent thrombus at the saphenofemoral junction extending into the common femoral vein, which has an appearance most consistent with chronic DVT. No occlusive thrombus is identified. Profunda Femoral Vein: No evidence of thrombus. Normal compressibility and  flow on color Doppler imaging. Femoral Vein: No evidence of thrombus. Normal compressibility, respiratory phasicity and response to augmentation. Popliteal Vein: Occlusive thrombus within the left popliteal vein. The vessel is noncompressible, with no color flow identified. Calf Veins: Limited evaluation due to body habitus and subcutaneous edema. Superficial Great Saphenous Vein: No evidence of thrombus. Normal compressibility. Other Findings:  None. IMPRESSION: 1. Occlusive acute deep venous thrombosis of the left popliteal vein. 2. Suspected chronic nonocclusive thrombus within the left common femoral vein at the saphenofemoral junction. 3. No  evidence of deep venous thrombosis within the right lower extremity. 4. Limited evaluation of the calf vessels due to body habitus and subcutaneous edema. These results will be called to the ordering clinician or representative by the Radiologist Assistant, and communication documented in the PACS or [employer]. Electronically Signed   By: Sharlet Salina M.D.   On: 09/02/2021 12:32   ECHOCARDIOGRAM COMPLETE  Result Date: 09/01/2021    ECHOCARDIOGRAM REPORT   Patient Name:   Jordan Jensen Date of Exam: 09/01/2021 Medical Rec #:  [id number]     Height:       71.0 in Accession #:    [id number]    Weight:       498.0 lb Date of Birth:  November 12, 1970      BSA:          3.107 m Patient Age:    51 years      BP:           110/81 mmHg Patient Gender: M             HR:           85 bpm. [facility] Procedure: 2D Echo, Color Doppler, Cardiac Doppler and Intracardiac            Opacification Agent Indications:    I50.9* Heart failure (unspecified)  History:        Patient has prior history of Echocardiogram examinations, most                 recent 03/06/2020. Risk Factors:Hypertension, Dyslipidemia and                 Sleep Apnea.  Sonographer:    Irving Burton Senior RDCS Referring Phys: [id number] JACOB J STINSON  Sonographer Comments: Technically difficult study due to poor echo windows. Technically difficult study with very poor echo windows due to morbid obesity. IMPRESSIONS  1. Very technically difficult study with limited valve evaluation, Definity contrast given.  2. Left ventricular ejection fraction, by estimation, is 20 to 25%. Left ventricular ejection fraction by 2D MOD biplane is 25.0 %. The left ventricle has severely decreased function. The left ventricle demonstrates global hypokinesis. The left ventricular internal cavity size was moderately dilated. Left ventricular diastolic parameters are consistent with Grade I diastolic dysfunction (impaired relaxation).  3. Right ventricular systolic function was  not well visualized. The right ventricular size is not well visualized.  4. The mitral valve was not well visualized. No evidence of mitral valve regurgitation.  5. The aortic valve was not well visualized. Aortic valve regurgitation is not visualized.  6. The inferior vena cava is dilated in size with <50% respiratory variability, suggesting right atrial pressure of 15 mmHg. Comparison(s): Changes from prior study are noted. 03/06/2020: LVEF 30-40%. FINDINGS  Left Ventricle: Left ventricular ejection fraction, by estimation, is 20 to 25%. Left ventricular ejection fraction by 2D MOD biplane is 25.0 %. The left ventricle  has severely decreased function. The left ventricle demonstrates global hypokinesis. Definity contrast agent was given IV to delineate the left ventricular endocardial borders. The left ventricular internal cavity size was moderately dilated. Suboptimal image quality limits for assessment of left ventricular hypertrophy. Left ventricular  diastolic parameters are consistent with Grade I diastolic dysfunction (impaired relaxation). Indeterminate filling pressures. Right Ventricle: The right ventricular size is not well visualized. Right vetricular wall thickness was not well visualized. Right ventricular systolic function was not well visualized. Left Atrium: Left atrial size was normal in size. Right Atrium: Right atrial size was normal in size. Pericardium: There is no evidence of pericardial effusion. Mitral Valve: The mitral valve was not well visualized. No evidence of mitral valve regurgitation. Tricuspid Valve: The tricuspid valve is not well visualized. Tricuspid valve regurgitation is not demonstrated. Aortic Valve: The aortic valve was not well visualized. Aortic valve regurgitation is not visualized. Pulmonic Valve: The pulmonic valve was not well visualized. Pulmonic valve regurgitation is not visualized. Aorta: The aortic root and ascending aorta are structurally normal, with no evidence  of dilitation. Venous: The inferior vena cava is dilated in size with less than 50% respiratory variability, suggesting right atrial pressure of 15 mmHg. IAS/Shunts: No atrial level shunt detected by color flow Doppler.  LEFT VENTRICLE PLAX 2D                        Biplane EF (MOD) LVOT diam:     2.30 cm         LV Biplane EF:   Left LV SV:         72                               ventricular LV SV Index:   23                               ejection LVOT Area:     4.15 cm                         fraction by                                                 2D MOD                                                 biplane is LV Volumes (MOD)                                25.0 %. LV vol d, MOD    192.0 ml A2C:                           Diastology LV vol d, MOD    237.0 ml      LV e' medial:    5.11 cm/s A4C:  LV E/e' medial:  12.7 LV vol s, MOD    142.0 ml      LV e' lateral:   5.11 cm/s A2C:                           LV E/e' lateral: 12.7 LV vol s, MOD    186.0 ml A4C: LV SV MOD A2C:   50.0 ml LV SV MOD A4C:   237.0 ml LV SV MOD BP:    56.7 ml RIGHT VENTRICLE RV S prime:     12.60 cm/s TAPSE (M-mode): 2.3 cm LEFT ATRIUM             Index        RIGHT ATRIUM           Index LA Vol (A2C):   85.8 ml 27.61 ml/m  RA Area:     22.80 cm LA Vol (A4C):   70.2 ml 22.59 ml/m  RA Volume:   73.80 ml  23.75 ml/m LA Biplane Vol: 80.7 ml 25.97 ml/m  AORTIC VALVE LVOT Vmax:   106.00 cm/s LVOT Vmean:  66.200 cm/s LVOT VTI:    0.174 m  AORTA Ao Root diam: 3.40 cm MITRAL VALVE MV Area (PHT): 3.65 cm    SHUNTS MV Decel Time: 208 msec    Systemic VTI:  0.17 m MV E velocity: 65.10 cm/s  Systemic Diam: 2.30 cm MV A velocity: 84.40 cm/s MV E/A ratio:  0.77 Zoila Shutter MD Electronically signed by Zoila Shutter MD Signature Date/Time: 09/01/2021/12:30:05 PM    Final     Cardiac Studies   Echo 09/01/21  1. Very technically difficult study with limited valve evaluation,  Definity contrast given.   2. Left  ventricular ejection fraction, by estimation, is 20 to 25%. Left  ventricular ejection fraction by 2D MOD biplane is 25.0 %. The left  ventricle has severely decreased function. The left ventricle demonstrates  global hypokinesis. The left  ventricular internal cavity size was moderately dilated. Left ventricular  diastolic parameters are consistent with Grade I diastolic dysfunction  (impaired relaxation).   3. Right ventricular systolic function was not well visualized. The right  ventricular size is not well visualized.   4. The mitral valve was not well visualized. No evidence of mitral valve  regurgitation.   5. The aortic valve was not well visualized. Aortic valve regurgitation  is not visualized.   6. The inferior vena cava is dilated in size with <50% respiratory  variability, suggesting right atrial pressure of 15 mmHg.   Comparison(s): Changes from prior study are noted. 03/06/2020: LVEF 30-40%.    Echo 03/06/20  1. Technically difficult assessment even with the use of echocontrast.  Grossly LVEF appears in the 30-40% range. . Left ventricular ejection  fraction, by estimation, is 30-40%. The left ventricle has moderately  decreased function. Left ventricular  endocardial border not optimally defined to evaluate regional wall motion.  There is mild left ventricular hypertrophy. Left ventricular diastolic  parameters are consistent with Grade I diastolic dysfunction (impaired  relaxation). Elevated left atrial  pressure.   2. Right ventricular systolic function was not well visualized. The right  ventricular size is not well visualized.   3. The mitral valve was not well visualized. No evidence of mitral valve  regurgitation. No evidence of mitral stenosis.   4. The aortic valve was not well visualized. Aortic valve regurgitation  is not visualized. No aortic stenosis is present.  5. The inferior vena cava is normal in size with greater than 50%  respiratory variability,  suggesting right atrial pressure of 3 mmHg.    Echo 02/2018  1. The left ventricle has moderate-severely reduced systolic function of  30-35%. There is diffuse hypokinesis. The cavity size is mildly increased.  There is mild left ventricular wall thickness. The left ventricular  diastolic function is indeterminate.   2. Normal left atrial size.   3. Normal right atrial size.   4. Normal tricuspid valve.   5. The aortic root is normal is size and structure.   6. No atrial level shunt detected by color flow Doppler.   7. No intracardiac thrombi or masses were visualized.    Myoview Lexiscan 2015 IMPRESSION:  Intermediate risk abnormal Lexiscan Cardiolite as outlined. There  were no diagnostic ST segment changes to indicate ischemia.  Perfusion imaging is most suggestive of soft tissue attenuation  affecting the anterior and inferolateral walls, scar cannot be  unequivocally excluded. No large ischemic territories were noted  however. LVEF is calculated at 35% with diffuse hypokinesis and  upper normal chamber volume. Could be consistent with a nonischemic  cardiomyopathy.     Patient Profile     51 y.o. male traumatic myelopathy C6 and 7, morbid obesity, asthma, lymphedema, OSA, chronic systolic heart failure LVEF as low as 30 to 35%, hypertension, hyperlipidemia who is being seen 09/02/2021 for the evaluation of Acute heart failure   Assessment & Plan    Acute on chronic combined heart failure - presented with worsening SOB/LLE for 2 days - In the ER BNP mildly elevated 153, CXR with increased vascular congestion. HS trop mildly elevated.  - IV lasix  BID - Net -5L - difficult to assess volume status given body habitus. Patient overall feels breathing is better.  - PTA lasix  daily - PTA Entresto and Toprol.  BP down to 80s today, will hold Entresto/toprol XL for now, can add back as BP tolerates.  Suspect will need to switch to losartan - Not a good candidate for SGLT2i  given high risk of yeast infection - low BP limiting GDMT - Scr/BUN stable - Echo this admission showed further reduced EF 20-25%, global HK, G1DD - Echo 03/2020 showed LVEF 30-40% - prior Myoview Lexiscan showed no ischemia - patient is overall feeling better, continue with diuresis  DVT -Occlusive acute DVT of left popliteal vein on duplex 8/28.  Started Eliquis   Elevated Troponin - mildly elevated with flat trend, suspect demand ischemia - Prior Myoview Lexiscan with no ischemia - no chest pain reported - can consider further OP work-up given further reduced EF.   Lymphedema - chronic lymphedema contributing to swelling, improving with IV lasix   HTN - intermittently low BP   OSA - reports intermittent use of CPAP   Morbid obesity Wheelchair bound - weight loss recommended - multiple comorbidities contributing  For questions or updates, please contact [facility] Please consult [email or web address] for contact info under    Signed, Cadence David Stall, PA-C  09/03/2021, 8:48 AM      Patient seen and examined.  Agree with above documentation.  On exam, patient is alert and oriented, regular rate and rhythm, no murmurs, diminished breath sounds,  JVD difficult to assess given habitus.  Net -3.3 L yesterday, -5 L on admission.  Creatinine stable at 0.9.  BP 89/55.  Given soft BP, recommend holding Entresto/toprol XL for now.  Continue IV Lasix  Little Ishikawa,  MD   

## 2021-09-03 NOTE — Progress Notes (Signed)
   09/03/21 0814  Assess: MEWS Score  Temp 97.8 F (36.6 C)  BP (!) 100/47  MAP (mmHg) (!) 63  Pulse Rate (!) 42  SpO2 93 %  Assess: MEWS Score  MEWS Temp 0  MEWS Systolic 1  MEWS Pulse 1  MEWS RR 0  MEWS LOC 0  MEWS Score 2  MEWS Score Color Yellow  Assess: if the MEWS score is Yellow or Red  Were vital signs taken at a resting state? Yes  Focused Assessment No change from prior assessment  Does the patient meet 2 or more of the SIRS criteria? No  MEWS guidelines implemented *See Row Information* Yes  Treat  MEWS Interventions Other (Comment)  Pain Scale 0-10  Pain Score 0  Take Vital Signs  Increase Vital Sign Frequency  Yellow: Q 2hr X 2 then Q 4hr X 2, if remains yellow, continue Q 4hrs  Notify: Provider  Provider Name/Title MD Tat  Date Provider Notified 09/03/21  Time Provider Notified [phone]  Provider response Other (Comment) (Hold metoprolol)  Date of Provider Response 09/03/21  Time of Provider Response 0830  Assess: SIRS CRITERIA  SIRS Temperature  0  SIRS Pulse 0  SIRS Respirations  0  SIRS WBC 1  SIRS Score Sum  1

## 2021-09-03 NOTE — Progress Notes (Signed)
PROGRESS NOTE  Jordan Jensen JQB:[id number] DOB: 12/25/1970 DOA: 08/31/2021 PCP: Donell Beers, FNP  Brief History:  Wt Readings from Last 3 Encounters:  08/01/21 (!) 480 lb (217.7 kg)  07/30/21 (!) 450 lb (204.1 kg)  06/04/21 (!) 460 lb (73.92 kg)  51 year old male with a history of traumatic myelopathy C6/7, morbid obesity, asthma, lymphedema, OSA, systolic CHF, hypertension, hyperlipidemia presenting with 3-day history of shortness of breath.  The patient's wife at the bedside who supplements the history.  She states that she has noted increasing peripheral edema and increasing abdominal girth at least for the past week.  The patient states that he has actually had worsening shortness of breath and orthopnea for the better part of at least a week.  He denies any fevers, chills, chest pain, headache, nausea, vomiting, diarrhea, abdominal pain. Patient states that he has been poorly compliant with CPAP at home.  In addition, he states that he continues to be only taking his Entresto once daily rather than twice daily.  He states that he has been taking furosemide 40 mg on a daily basis although he does withhold taking it if he has a physician appointment or has somewhere to go. In the ED, the patient will had a low-grade temperature of 99.4 F.  He was hemodynamically stable with oxygen saturation 95 to 100% on room air.  WBC 4.4, hemoglobin 14.0, platelets 230,000.  Sodium 137, potassium 3.9, bicarbonate 38, serum creatinine 0.83.  BNP was 153.0.  Chest x-ray showed increased vascular congestion.  The patient was started on IV furosemide and admitted for further evaluation and treatment.    Assessment and Plan: * Acute on chronic combined systolic and diastolic CHF (congestive heart failure) (HCC) Spouse states that the patient weighed about 435 pounds about 9 months ago. Patient appears at least 20 pounds overweight Difficult to assess fluid status given the patient's body  habitus Obtain ReDs reading--unable due to weight limitations Accurate I/O--incomplete but about NEG 5L Continue IV Lasix 40 mg IV twice daily Continue metoprolol succinate>>stopped due to low BP Continue Entresto>>stopped due to low BP   Acute deep vein thrombosis (DVT) of popliteal vein of left lower extremity (HCC) Started apixaban 8/28 CTA chest--no PE  Elevated troponin Due demand ischemia is setting of decompensated CHF -no chest pain reported - Prior Myoview Lexiscan with no ischemia  Perirectal fistula Outpatient follow-up with general surgery Gluteal/perianal area not grossly infected on exam  Hyperlipidemia Continue statin  OSA (obstructive sleep apnea) Poor compliance at home with CPAP I have asked the patient to bring in his home CPAP machine ---he is able to wear it 3-4 hours  Morbid obesity with BMI of 60.0-69.9, adult (HCC) Lifestyle modification BMI 69.46  Pulmonary nodule, left Outpatient surveillance   Family Communication:   spouse updated 8/29   Consultants:  cardiology   Code Status:  FULL    DVT Prophylaxis:  apixaban     Procedures: As Listed in Progress Note Above   Antibiotics: None        Subjective: He is breathing better.  Patient denies fevers, chills, headache, chest pain, dyspnea, nausea, vomiting, diarrhea, abdominal pain   Objective: Vitals:   09/03/21 0706 09/03/21 0814 09/03/21 1042 09/03/21 1328  BP:  (!) 100/47 (!) 89/55 96/73  Pulse:  (!) 42 (!) 45 78  Resp:      Temp:  97.8 F (36.6 C)    TempSrc:  SpO2: 94% 93% 90% 94%  Weight:      Height:        Intake/Output Summary (Last 24 hours) at 09/03/2021 1640 Last data filed at 09/03/2021 1610 Gross per 24 hour  Intake 240 ml  Output 2000 ml  Net -1760 ml   Weight change: -8.618 kg Exam:  General:  Pt is alert, follows commands appropriately, not in acute distress HEENT: No icterus, No thrush, No neck mass, [place]/AT Cardiovascular: RRR, S1/S2, no  rubs, no gallops Respiratory: bibasilar rales. No wheeze Abdomen: Soft/+BS, non tender, non distended, no guarding Extremities: 2 + LE edema, No lymphangitis, No petechiae, No rashes, no synovitis   Data Reviewed: I have personally reviewed following labs and imaging studies Basic Metabolic Panel: Recent Labs  Lab 08/31/21 1144 09/01/21 0604 09/02/21 0446 09/03/21 0348  NA 137 138 136 137  K 3.9 4.5 3.9 4.1  CL 100 96* 97* 97*  CO2 30 36* 33* 33*  GLUCOSE 101* 105* 142* 98  BUN CREATININE 0.83 0.90 1.11 0.93  CALCIUM 8.7* 8.8* 8.5* 8.7*   Liver Function Tests: No results for input(s): "AST", "ALT", "ALKPHOS", "BILITOT", "PROT", "ALBUMIN" in the last 168 hours. No results for input(s): "LIPASE", "AMYLASE" in the last 168 hours. No results for input(s): "AMMONIA" in the last 168 hours. Coagulation Profile: No results for input(s): "INR", "PROTIME" in the last 168 hours. CBC: Recent Labs  Lab 08/31/21 1144  WBC 4.4  NEUTROABS 2.2  HGB 14.0  HCT 44.8  MCV 91.6  PLT 138*   Cardiac Enzymes: No results for input(s): "CKTOTAL", "CKMB", "CKMBINDEX", "TROPONINI" in the last 168 hours. BNP: Invalid input(s): "POCBNP" CBG: No results for input(s): "GLUCAP" in the last 168 hours. HbA1C: No results for input(s): "HGBA1C" in the last 72 hours. Urine analysis:    Component Value Date/Time   COLORURINE YELLOW 06/04/2021 1324   APPEARANCEUR CLEAR 06/04/2021 1324   LABSPEC 1.016 06/04/2021 1324   PHURINE 7.0 06/04/2021 1324   GLUCOSEU NEGATIVE 06/04/2021 1324   HGBUR NEGATIVE 06/04/2021 1324   BILIRUBINUR NEGATIVE 06/04/2021 1324   BILIRUBINUR negative 08/26/2020 1558   BILIRUBINUR NEG 06/13/2015 1650   KETONESUR NEGATIVE 06/04/2021 1324   PROTEINUR NEGATIVE 06/04/2021 1324   UROBILINOGEN 0.2 08/26/2020 1558   UROBILINOGEN 1.0 09/26/2018 1619   NITRITE NEGATIVE 06/04/2021 1324   LEUKOCYTESUR NEGATIVE 06/04/2021 1324   Sepsis  Labs: (procalcitonin:4,lacticidven:4) ) Recent Results (from the past 240 hour(s))  Resp Panel by RT-PCR (Flu A&B, Covid) Anterior Nasal Swab     Status: None   Collection Time: 08/31/21 12:10 PM   Specimen: Anterior Nasal Swab  Result Value Ref Range Status   SARS Coronavirus 2 by RT PCR NEGATIVE NEGATIVE Final    Comment: (NOTE) SARS-CoV-2 target nucleic acids are NOT DETECTED.  The SARS-CoV-2 RNA is generally detectable in upper respiratory specimens during the acute phase of infection. The lowest concentration of SARS-CoV-2 viral copies this assay can detect is 138 copies/mL. A negative result does not preclude SARS-Cov-2 infection and should not be used as the sole basis for treatment or other patient management decisions. A negative result may occur with  improper specimen collection/handling, submission of specimen other than nasopharyngeal swab, presence of viral mutation(s) within the areas targeted by this assay, and inadequate number of viral copies(<138 copies/mL). A negative result must be combined with clinical observations, patient history, and epidemiological information. The expected result is Negative.  Fact Sheet for Patients:  BloggerCourse.com  Fact  Sheet for Healthcare Providers:  SeriousBroker.it  This test is no t yet approved or cleared by the [place] FDA and  has been authorized for detection and/or diagnosis of SARS-CoV-2 by FDA under an Emergency Use Authorization (EUA). This EUA will remain  in effect (meaning this test can be used) for the duration of the COVID-19 declaration under Section 564(b)(1) of the Act, 21 U.S.C.section 360bbb-3(b)(1), unless the authorization is terminated  or revoked sooner.       Influenza A by PCR NEGATIVE NEGATIVE Final   Influenza B by PCR NEGATIVE NEGATIVE Final    Comment: (NOTE) The Xpert Xpress SARS-CoV-2/FLU/RSV plus assay is intended as an  aid in the diagnosis of influenza from Nasopharyngeal swab specimens and should not be used as a sole basis for treatment. Nasal washings and aspirates are unacceptable for Xpert Xpress SARS-CoV-2/FLU/RSV testing.  Fact Sheet for Patients: BloggerCourse.com  Fact Sheet for Healthcare Providers: SeriousBroker.it  This test is not yet approved or cleared by the [place] FDA and has been authorized for detection and/or diagnosis of SARS-CoV-2 by FDA under an Emergency Use Authorization (EUA). This EUA will remain in effect (meaning this test can be used) for the duration of the COVID-19 declaration under Section 564(b)(1) of the Act, 21 U.S.C. section 360bbb-3(b)(1), unless the authorization is terminated or revoked.  Performed at [facility], [address], [place], [place] [address]      Scheduled Meds:  apixaban  10 mg Oral BID   Followed by   Melene Muller ON 09/09/2021] apixaban  5 mg Oral BID   fluticasone furoate-vilanterol  1 puff Inhalation Daily   furosemide  40 mg Intravenous BID   potassium chloride  40 mEq Oral BID   rosuvastatin  20 mg Oral Daily   Continuous Infusions:  Procedures/Studies: CT Angio Chest Pulmonary Embolism (PE) W or WO Contrast  Addendum Date: 09/02/2021   ADDENDUM REPORT: 09/02/2021 15:49 ADDENDUM: Following should be added to the impression. There is a 5 mm nodule in left lower lung field. No follow-up needed if patient is low-risk.This recommendation follows the consensus statement: Guidelines for Management of Incidental Pulmonary Nodules Detected on CT Images: From the Fleischner Society 2017; Radiology 2017; 284:228-243. Electronically Signed   By: Ernie Avena M.D.   On: 09/02/2021 15:49   Result Date: 09/02/2021 CLINICAL DATA:  DVT lower extremity, positive D-dimer EXAM: CT ANGIOGRAPHY CHEST WITH CONTRAST TECHNIQUE: Multidetector CT imaging of the chest was performed using the standard  protocol during bolus administration of intravenous contrast. Multiplanar CT image reconstructions and MIPs were obtained to evaluate the vascular anatomy. RADIATION DOSE REDUCTION: This exam was performed according to the departmental dose-optimization program which includes automated exposure control, adjustment of the mA and/or kV according to patient size and/or use of iterative reconstruction technique. CONTRAST:  OMNIPAQUE IOHEXOL 350 MG/ML SOLN COMPARISON:  Previous studies including the chest radiograph done on 08/31/2021 FINDINGS: Cardiovascular: Heart is enlarged in size. The dilation of left ventricle. There is homogeneous enhancement in thoracic aorta. There are no intraluminal filling defects in central pulmonary artery branches. Evaluation of small peripheral branches in the lower lung fields is limited by infiltrates. Mediastinum/Nodes: Unremarkable. Lungs/Pleura: There are small linear patchy densities in both lower lung fields suggesting subsegmental atelectasis/pneumonia. In image 91 of series 6, 6 mm nodule is seen in left lower lobe. Minimal bilateral pleural effusions are seen there is no pneumothorax. Upper Abdomen: There is fatty infiltration in liver. Musculoskeletal: No acute findings are seen. Review  of the MIP images confirms the above findings. IMPRESSION: There is no evidence of central pulmonary artery embolism. Evaluation of small peripheral branches in the lower lung fields is limited by infiltrates. There is no evidence of thoracic aortic dissection. Small linear patchy densities in the lower lung fields suggest subsegmental atelectasis/pneumonia. Cardiomegaly.  Fatty liver. Electronically Signed: By: Ernie Avena M.D. On: 09/02/2021 15:02   US Venous Img Lower Bilateral (DVT)  Result Date: 09/02/2021 CLINICAL DATA:  Short of breath for 4 days, bilateral lower extremity edema EXAM: BILATERAL LOWER EXTREMITY VENOUS DOPPLER ULTRASOUND TECHNIQUE: Gray-scale sonography  with graded compression, as well as color Doppler and duplex ultrasound were performed to evaluate the lower extremity deep venous systems from the level of the common femoral vein and including the common femoral, femoral, profunda femoral, popliteal and calf veins including the posterior tibial, peroneal and gastrocnemius veins when visible. The superficial great saphenous vein was also interrogated. Spectral Doppler was utilized to evaluate flow at rest and with distal augmentation maneuvers in the common femoral, femoral and popliteal veins. COMPARISON:  None Available. FINDINGS: RIGHT LOWER EXTREMITY Common Femoral Vein: No evidence of thrombus. Normal compressibility, respiratory phasicity and response to augmentation. Saphenofemoral Junction: No evidence of thrombus. Normal compressibility and flow on color Doppler imaging. Profunda Femoral Vein: No evidence of thrombus. Normal compressibility and flow on color Doppler imaging. Femoral Vein: No evidence of thrombus. Normal compressibility, respiratory phasicity and response to augmentation. Popliteal Vein: No evidence of thrombus. Normal compressibility, respiratory phasicity and response to augmentation. Calf Veins: No gross evidence of thrombus. Evaluation limited due to body habitus and edema. Superficial Great Saphenous Vein: No evidence of thrombus. Normal compressibility. Other Findings:  None. LEFT LOWER EXTREMITY Common Femoral Vein: No evidence of thrombus. Normal compressibility, respiratory phasicity and response to augmentation. Saphenofemoral Junction: There is a small adherent thrombus at the saphenofemoral junction extending into the common femoral vein, which has an appearance most consistent with chronic DVT. No occlusive thrombus is identified. Profunda Femoral Vein: No evidence of thrombus. Normal compressibility and flow on color Doppler imaging. Femoral Vein: No evidence of thrombus. Normal compressibility, respiratory phasicity and  response to augmentation. Popliteal Vein: Occlusive thrombus within the left popliteal vein. The vessel is noncompressible, with no color flow identified. Calf Veins: Limited evaluation due to body habitus and subcutaneous edema. Superficial Great Saphenous Vein: No evidence of thrombus. Normal compressibility. Other Findings:  None. IMPRESSION: 1. Occlusive acute deep venous thrombosis of the left popliteal vein. 2. Suspected chronic nonocclusive thrombus within the left common femoral vein at the saphenofemoral junction. 3. No evidence of deep venous thrombosis within the right lower extremity. 4. Limited evaluation of the calf vessels due to body habitus and subcutaneous edema. These results will be called to the ordering clinician or representative by the Radiologist Assistant, and communication documented in the PACS or [employer]. Electronically Signed   By: Sharlet Salina M.D.   On: 09/02/2021 12:32   ECHOCARDIOGRAM COMPLETE  Result Date: 09/01/2021    ECHOCARDIOGRAM REPORT   Patient Name:   Jordan Jensen Date of Exam: 09/01/2021 Medical Rec #:  [id number]     Height:       71.0 in Accession #:    [id number]    Weight:       498.0 lb Date of Birth:  Jul 11, 1970      BSA:          3.107 m Patient Age:    61 years  BP:           110/81 mmHg Patient Gender: M             HR:           85 bpm. [facility] Procedure: 2D Echo, Color Doppler, Cardiac Doppler and Intracardiac            Opacification Agent Indications:    I50.9* Heart failure (unspecified)  History:        Patient has prior history of Echocardiogram examinations, most                 recent 03/06/2020. Risk Factors:Hypertension, Dyslipidemia and                 Sleep Apnea.  Sonographer:    Irving Burton Senior RDCS Referring Phys: [phone] JACOB J STINSON  Sonographer Comments: Technically difficult study due to poor echo windows. Technically difficult study with very poor echo windows due to morbid obesity. IMPRESSIONS  1. Very  technically difficult study with limited valve evaluation, Definity contrast given.  2. Left ventricular ejection fraction, by estimation, is 20 to 25%. Left ventricular ejection fraction by 2D MOD biplane is 25.0 %. The left ventricle has severely decreased function. The left ventricle demonstrates global hypokinesis. The left ventricular internal cavity size was moderately dilated. Left ventricular diastolic parameters are consistent with Grade I diastolic dysfunction (impaired relaxation).  3. Right ventricular systolic function was not well visualized. The right ventricular size is not well visualized.  4. The mitral valve was not well visualized. No evidence of mitral valve regurgitation.  5. The aortic valve was not well visualized. Aortic valve regurgitation is not visualized.  6. The inferior vena cava is dilated in size with <50% respiratory variability, suggesting right atrial pressure of 15 mmHg. Comparison(s): Changes from prior study are noted. 03/06/2020: LVEF 30-40%. FINDINGS  Left Ventricle: Left ventricular ejection fraction, by estimation, is 20 to 25%. Left ventricular ejection fraction by 2D MOD biplane is 25.0 %. The left ventricle has severely decreased function. The left ventricle demonstrates global hypokinesis. Definity contrast agent was given IV to delineate the left ventricular endocardial borders. The left ventricular internal cavity size was moderately dilated. Suboptimal image quality limits for assessment of left ventricular hypertrophy. Left ventricular  diastolic parameters are consistent with Grade I diastolic dysfunction (impaired relaxation). Indeterminate filling pressures. Right Ventricle: The right ventricular size is not well visualized. Right vetricular wall thickness was not well visualized. Right ventricular systolic function was not well visualized. Left Atrium: Left atrial size was normal in size. Right Atrium: Right atrial size was normal in size. Pericardium: There is no  evidence of pericardial effusion. Mitral Valve: The mitral valve was not well visualized. No evidence of mitral valve regurgitation. Tricuspid Valve: The tricuspid valve is not well visualized. Tricuspid valve regurgitation is not demonstrated. Aortic Valve: The aortic valve was not well visualized. Aortic valve regurgitation is not visualized. Pulmonic Valve: The pulmonic valve was not well visualized. Pulmonic valve regurgitation is not visualized. Aorta: The aortic root and ascending aorta are structurally normal, with no evidence of dilitation. Venous: The inferior vena cava is dilated in size with less than 50% respiratory variability, suggesting right atrial pressure of 15 mmHg. IAS/Shunts: No atrial level shunt detected by color flow Doppler.  LEFT VENTRICLE PLAX 2D                        Biplane EF (MOD)  LVOT diam:     2.30 cm         LV Biplane EF:   Left LV SV:         72                               ventricular LV SV Index:   23                               ejection LVOT Area:     4.15 cm                         fraction by                                                 2D MOD                                                 biplane is LV Volumes (MOD)                                25.0 %. LV vol d, MOD    192.0 ml A2C:                           Diastology LV vol d, MOD    237.0 ml      LV e' medial:    5.11 cm/s A4C:                           LV E/e' medial:  12.7 LV vol s, MOD    142.0 ml      LV e' lateral:   5.11 cm/s A2C:                           LV E/e' lateral: 12.7 LV vol s, MOD    186.0 ml A4C: LV SV MOD A2C:   50.0 ml LV SV MOD A4C:   237.0 ml LV SV MOD BP:    56.7 ml RIGHT VENTRICLE RV S prime:     12.60 cm/s TAPSE (M-mode): 2.3 cm LEFT ATRIUM             Index        RIGHT ATRIUM           Index LA Vol (A2C):   85.8 ml 27.61 ml/m  RA Area:     22.80 cm LA Vol (A4C):   70.2 ml 22.59 ml/m  RA Volume:   73.80 ml  23.75 ml/m LA Biplane Vol: 80.7 ml 25.97 ml/m  AORTIC VALVE LVOT Vmax:    106.00 cm/s LVOT Vmean:  66.200 cm/s LVOT VTI:    0.174 m  AORTA Ao Root diam: 3.40 cm MITRAL VALVE MV Area (PHT): 3.65 cm    SHUNTS MV Decel Time: 208 msec    Systemic VTI:  0.17 m MV E velocity: 65.10  cm/s  Systemic Diam: 2.30 cm MV A velocity: 84.40 cm/s MV E/A ratio:  0.77 Zoila Shutter MD Electronically signed by Zoila Shutter MD Signature Date/Time: 09/01/2021/12:30:05 PM    Final    DG Chest Portable 1 View  Result Date: 08/31/2021 CLINICAL DATA:  Shortness of breath EXAM: PORTABLE CHEST 1 VIEW COMPARISON:  11/12/2020 FINDINGS: Cardiomegaly and congestion of central vessels. There is no edema, consolidation, effusion, or pneumothorax. ACDF hardware IMPRESSION: Cardiomegaly and central vascular congestion. Electronically Signed   By: Tiburcio Pea M.D.   On: 08/31/2021 11:48    Catarina Hartshorn, DO  [facility]  If 7PM-7AM, please contact night-coverage [email or web address] Password [email or web address] 09/03/2021, 4:40 PM   LOS: 3 days

## 2021-09-04 DIAGNOSIS — I5043 Acute on chronic combined systolic (congestive) and diastolic (congestive) heart failure: Secondary | ICD-10-CM | POA: Diagnosis not present

## 2021-09-04 DIAGNOSIS — I82432 Acute embolism and thrombosis of left popliteal vein: Secondary | ICD-10-CM | POA: Diagnosis not present

## 2021-09-04 DIAGNOSIS — J9601 Acute respiratory failure with hypoxia: Secondary | ICD-10-CM | POA: Diagnosis not present

## 2021-09-04 DIAGNOSIS — R778 Other specified abnormalities of plasma proteins: Secondary | ICD-10-CM | POA: Diagnosis not present

## 2021-09-04 LAB — BASIC METABOLIC PANEL
Anion gap: 10 (ref 5–15)
BUN: 14 mg/dL (ref 6–20)
CO2: 30 mmol/L (ref 22–32)
Calcium: 8.6 mg/dL — ABNORMAL LOW (ref 8.9–10.3)
Chloride: 96 mmol/L — ABNORMAL LOW (ref 98–111)
Creatinine, Ser: 1.01 mg/dL (ref 0.61–1.24)
GFR, Estimated: >60 mL/min (ref >60)
Glucose, Bld: 118 mg/dL — ABNORMAL HIGH (ref 70–99)
Potassium: 4 mmol/L (ref 3.5–5.1)
Sodium: 136 mmol/L (ref 135–145)

## 2021-09-04 LAB — MAGNESIUM: Magnesium: 2.1 mg/dL (ref 1.7–2.4)

## 2021-09-04 MED ORDER — FUROSEMIDE 10 MG/ML IJ SOLN
20.0000 mg | Freq: Once | INTRAMUSCULAR | Status: AC
  Administered 2021-09-04: 20 mg via INTRAVENOUS
  Filled 2021-09-04: qty 2

## 2021-09-04 MED ORDER — FUROSEMIDE 10 MG/ML IJ SOLN
60.0000 mg | Freq: Two times a day (BID) | INTRAMUSCULAR | Status: DC
  Administered 2021-09-04 – 2021-09-05 (×2): 60 mg via INTRAVENOUS
  Filled 2021-09-04 (×2): qty 6

## 2021-09-04 NOTE — Progress Notes (Signed)
Nutrition Education Note  RD consulted for nutrition education regarding CHF.  RD attached "Heart Failure Eating Plan" to AVS.   Current diet order is Heart Healthy, patient is consuming approximately 100% of meals at this time.   Intake/Output Summary (Last 24 hours) at 09/04/2021 1318 Last data filed at 09/04/2021 0711 Gross per 24 hour  Intake 480 ml  Output 1550 ml  Net -1070 ml     Labs and medications reviewed.    No further nutrition interventions warranted at this time. If additional nutrition issues arise, please re-consult RD.   Royann Shivers MS,RD,CSG,LDN Contact: Loretha Stapler

## 2021-09-04 NOTE — Progress Notes (Signed)
Rounding Note    Patient Name: Jordan Jensen Date of Encounter: 09/04/2021  [facility] Cardiologist: Donato Schultz, MD   Subjective   Breathing improved. No chest pain or palpitations. Still feels tight in his legs. Unsure of baseline weight but says this was at ~ 410 - 420 lbs earlier this year.   Inpatient Medications    Scheduled Meds:  apixaban  10 mg Oral BID   Followed by   Melene Muller ON 09/09/2021] apixaban  5 mg Oral BID   fluticasone furoate-vilanterol  1 puff Inhalation Daily   furosemide  40 mg Intravenous BID   potassium chloride  40 mEq Oral BID   rosuvastatin  20 mg Oral Daily   Continuous Infusions:  PRN Meds: albuterol, diclofenac Sodium, ondansetron **OR** ondansetron (ZOFRAN) IV, sodium chloride   Vital Signs    Vitals:   09/03/21 1328 09/03/21 2052 09/04/21 0518 09/04/21 0718  BP: 96/73 (!) 131/115 113/65   Pulse: 78 62 77   Resp:  16 18   Temp:  (!) 97.2 F (36.2 C) (!) 97 F (36.1 C)   TempSrc:      SpO2: 94% 95% 93% 94%  Weight:   (!) 187.8 kg   Height:        Intake/Output Summary (Last 24 hours) at 09/04/2021 0901 Last data filed at 09/04/2021 0711 Gross per 24 hour  Intake 720 ml  Output 1550 ml  Net -830 ml      09/04/2021    5:18 AM 09/03/2021    5:00 AM 09/02/2021    5:00 AM  Last 3 Weights  Weight (lbs) 414 lb 424 lb 443 lb  Weight (kg) 187.789 kg 192.325 kg 200.943 kg      Telemetry    NSR, HR in 70's to 80's with frequent PVC's and occasional couplets.  - Personally Reviewed  ECG    No new tracings.   Physical Exam   GEN: Pleasant, obese male appearing in no acute distress.   Neck: JVD difficult to assess secondary to body habitus.  Cardiac: RRR, no murmurs, rubs, or gallops.  Respiratory: Clear to auscultation bilaterally. GI: Soft, nontender, non-distended  MS: Chronic appearing 2+ edema; No deformity. Neuro:  Nonfocal  Psych: Normal affect   Labs    High Sensitivity Troponin:   Recent Labs  Lab  08/31/21 1144 08/31/21 1341  TROPONINIHS 48* 51*     Chemistry Recent Labs  Lab 09/02/21 0446 09/03/21 0348 09/04/21 0254  NA 136 137 136  K 3.9 4.1 4.0  CL 97* 97* 96*  CO2 33* 33* 30  GLUCOSE 142* 98 118*  BUN 13 12 14   CREATININE 1.11 0.93 1.01  CALCIUM 8.5* 8.7* 8.6*  MG  --   --  2.1  GFRNONAA >60 >60 >60  ANIONGAP 6 7 10     Lipids No results for input(s): "CHOL", "TRIG", "HDL", "LABVLDL", "LDLCALC", "CHOLHDL" in the last 168 hours.  Hematology Recent Labs  Lab 08/31/21 1144  WBC 4.4  RBC 4.89  HGB 14.0  HCT 44.8  MCV 91.6  MCH 28.6  MCHC 31.3  RDW 13.6  PLT 138*   Thyroid No results for input(s): "TSH", "FREET4" in the last 168 hours.  BNP Recent Labs  Lab 08/31/21 1144  BNP 153.0*    DDimer  Recent Labs  Lab 09/02/21 0446  DDIMER 1.83*     Radiology    CT Angio Chest Pulmonary Embolism (PE) W or WO Contrast  Addendum Date: 09/02/2021  ADDENDUM REPORT: 09/02/2021 15:49 ADDENDUM: Following should be added to the impression. There is a 5 mm nodule in left lower lung field. No follow-up needed if patient is low-risk.This recommendation follows the consensus statement: Guidelines for Management of Incidental Pulmonary Nodules Detected on CT Images: From the Fleischner Society 2017; Radiology 2017; 284:228-243. Electronically Signed   By: Ernie Avena M.D.   On: 09/02/2021 15:49   Result Date: 09/02/2021 CLINICAL DATA:  DVT lower extremity, positive D-dimer EXAM: CT ANGIOGRAPHY CHEST WITH CONTRAST TECHNIQUE: Multidetector CT imaging of the chest was performed using the standard protocol during bolus administration of intravenous contrast. Multiplanar CT image reconstructions and MIPs were obtained to evaluate the vascular anatomy. RADIATION DOSE REDUCTION: This exam was performed according to the departmental dose-optimization program which includes automated exposure control, adjustment of the mA and/or kV according to patient size and/or use of  iterative reconstruction technique. CONTRAST:  OMNIPAQUE IOHEXOL 350 MG/ML SOLN COMPARISON:  Previous studies including the chest radiograph done on 08/31/2021 FINDINGS: Cardiovascular: Heart is enlarged in size. The dilation of left ventricle. There is homogeneous enhancement in thoracic aorta. There are no intraluminal filling defects in central pulmonary artery branches. Evaluation of small peripheral branches in the lower lung fields is limited by infiltrates. Mediastinum/Nodes: Unremarkable. Lungs/Pleura: There are small linear patchy densities in both lower lung fields suggesting subsegmental atelectasis/pneumonia. In image 91 of series 6, 6 mm nodule is seen in left lower lobe. Minimal bilateral pleural effusions are seen there is no pneumothorax. Upper Abdomen: There is fatty infiltration in liver. Musculoskeletal: No acute findings are seen. Review of the MIP images confirms the above findings. IMPRESSION: There is no evidence of central pulmonary artery embolism. Evaluation of small peripheral branches in the lower lung fields is limited by infiltrates. There is no evidence of thoracic aortic dissection. Small linear patchy densities in the lower lung fields suggest subsegmental atelectasis/pneumonia. Cardiomegaly.  Fatty liver. Electronically Signed: By: Ernie Avena M.D. On: 09/02/2021 15:02   US Venous Img Lower Bilateral (DVT)  Result Date: 09/02/2021 CLINICAL DATA:  Short of breath for 4 days, bilateral lower extremity edema EXAM: BILATERAL LOWER EXTREMITY VENOUS DOPPLER ULTRASOUND TECHNIQUE: Gray-scale sonography with graded compression, as well as color Doppler and duplex ultrasound were performed to evaluate the lower extremity deep venous systems from the level of the common femoral vein and including the common femoral, femoral, profunda femoral, popliteal and calf veins including the posterior tibial, peroneal and gastrocnemius veins when visible. The superficial great saphenous  vein was also interrogated. Spectral Doppler was utilized to evaluate flow at rest and with distal augmentation maneuvers in the common femoral, femoral and popliteal veins. COMPARISON:  None Available. FINDINGS: RIGHT LOWER EXTREMITY Common Femoral Vein: No evidence of thrombus. Normal compressibility, respiratory phasicity and response to augmentation. Saphenofemoral Junction: No evidence of thrombus. Normal compressibility and flow on color Doppler imaging. Profunda Femoral Vein: No evidence of thrombus. Normal compressibility and flow on color Doppler imaging. Femoral Vein: No evidence of thrombus. Normal compressibility, respiratory phasicity and response to augmentation. Popliteal Vein: No evidence of thrombus. Normal compressibility, respiratory phasicity and response to augmentation. Calf Veins: No gross evidence of thrombus. Evaluation limited due to body habitus and edema. Superficial Great Saphenous Vein: No evidence of thrombus. Normal compressibility. Other Findings:  None. LEFT LOWER EXTREMITY Common Femoral Vein: No evidence of thrombus. Normal compressibility, respiratory phasicity and response to augmentation. Saphenofemoral Junction: There is a small adherent thrombus at the saphenofemoral junction extending into the common  femoral vein, which has an appearance most consistent with chronic DVT. No occlusive thrombus is identified. Profunda Femoral Vein: No evidence of thrombus. Normal compressibility and flow on color Doppler imaging. Femoral Vein: No evidence of thrombus. Normal compressibility, respiratory phasicity and response to augmentation. Popliteal Vein: Occlusive thrombus within the left popliteal vein. The vessel is noncompressible, with no color flow identified. Calf Veins: Limited evaluation due to body habitus and subcutaneous edema. Superficial Great Saphenous Vein: No evidence of thrombus. Normal compressibility. Other Findings:  None. IMPRESSION: 1. Occlusive acute deep venous  thrombosis of the left popliteal vein. 2. Suspected chronic nonocclusive thrombus within the left common femoral vein at the saphenofemoral junction. 3. No evidence of deep venous thrombosis within the right lower extremity. 4. Limited evaluation of the calf vessels due to body habitus and subcutaneous edema. These results will be called to the ordering clinician or representative by the Radiologist Assistant, and communication documented in the PACS or [employer]. Electronically Signed   By: Sharlet Salina M.D.   On: 09/02/2021 12:32    Cardiac Studies   Echocardiogram: 08/2021 IMPRESSIONS     1. Very technically difficult study with limited valve evaluation,  Definity contrast given.   2. Left ventricular ejection fraction, by estimation, is 20 to 25%. Left  ventricular ejection fraction by 2D MOD biplane is 25.0 %. The left  ventricle has severely decreased function. The left ventricle demonstrates  global hypokinesis. The left  ventricular internal cavity size was moderately dilated. Left ventricular  diastolic parameters are consistent with Grade I diastolic dysfunction  (impaired relaxation).   3. Right ventricular systolic function was not well visualized. The right  ventricular size is not well visualized.   4. The mitral valve was not well visualized. No evidence of mitral valve  regurgitation.   5. The aortic valve was not well visualized. Aortic valve regurgitation  is not visualized.   6. The inferior vena cava is dilated in size with <50% respiratory  variability, suggesting right atrial pressure of 15 mmHg.   Comparison(s): Changes from prior study are noted. 03/06/2020: LVEF 30-40%.   Patient Profile     51 y.o. male HFrEF (EF 45% in 2015 with NST showing no ischemia, EF at 30-35% by echo in 02/2018 and 30-40% by echo in 03/2020), chronic lymphedema, spinal cord injury, obesity, HTN, HLD and GERD who is currently admitted for an acute CHF exacerbation.   Assessment  & Plan    1. Acute HFrEF - Presented with worsening dyspnea on exertion and edema for 2 days and BNP at 153 on admission but likely not accurate given his body habitus. Echo this admission shows EF is further reduced at 20-25% as this was previously 30-40% in 03/2020 but 30-35% in 2020. - He has been receiving IV Lasix 40mg  BID with a recorded output of -5.6 L. Doubt the accuracy of recorded weights as listed at 515 lbs on admission and at 424 lbs yesterday and 414 lbs today. Given persistent volume overload, stable renal function and improvement in BP, will titrate IV Lasix to 60mg  BID for today. Will need to obtain a standing weight prior to discharge.  - He was on Entresto and Toprol-XL prior to admission but both have been held due to hypotension. Can likely restart Toprol-XL tomorrow pending BP trend and add back Entresto as an outpatient (had only been taking once daily as an outpatient as he was unaware this was a BID medication). Not an ideal candidate for SGLT2 inhibitor  therapy given his body habitus.   2. Acute DVT - Lower extremity dopplers this admission showed an occlusive acute DVT along the left popliteal vein and suspected chronic nonocclusive thrombus along the left common femoral vein. CTA negative for a PE. He has been started on Eliquis 10mg  BID for anticoagulation.    3. Elevated Troponin Values - Enzymes have been flat at 48 and 51 which is likely secondary to demand ischemia in the setting of his acute CHF exacerbation. Echo does show his EF is further reduced as outlined above. Most recent ischemic evaluation was a NST in 2015 which showed no ischemia. Can consider repeat ischemic evaluation as an outpatient. Sensitivity of non-invasive options will be limited given his body habitus.   4. HTN - He has actually been hypotensive this admission with BP at 89/55 - 131/115. At 113/65 on most recent check. PTA Toprol-XL 25mg  daily and Entresto 24-26mg  BID currently held.   5.  OSA - Using his CPAP intermittently at home and was able to tolerate for 2.5 hours last night. Compliance encouraged.   For questions or updates, please contact [facility] Please consult [email or web address] for contact info under        Signed, 2016, PA-C  09/04/2021, 9:01 AM

## 2021-09-04 NOTE — Plan of Care (Signed)
?  Problem: Education: ?Goal: Ability to demonstrate management of disease process will improve ?Outcome: Progressing ?  ?

## 2021-09-04 NOTE — Progress Notes (Signed)
PROGRESS NOTE  Jordan Jensen ZOX:[id number] DOB: Jul 05, 1970 DOA: 08/31/2021 PCP: Donell Beers, FNP  Brief History:  Wt Readings from Last 3 Encounters:  08/01/21 (!) 480 lb (217.7 kg)  07/30/21 (!) 450 lb (204.1 kg)  06/04/21 (!) 460 lb (60.33 kg)  51 year old male with a history of traumatic myelopathy C6/7, morbid obesity, asthma, lymphedema, OSA, systolic CHF, hypertension, hyperlipidemia presenting with 3-day history of shortness of breath.  The patient's wife at the bedside who supplements the history.  She states that she has noted increasing peripheral edema and increasing abdominal girth at least for the past week.  The patient states that he has actually had worsening shortness of breath and orthopnea for the better part of at least a week.  He denies any fevers, chills, chest pain, headache, nausea, vomiting, diarrhea, abdominal pain. Patient states that he has been poorly compliant with CPAP at home.  In addition, he states that he continues to be only taking his Entresto once daily rather than twice daily.  He states that he has been taking furosemide 40 mg on a daily basis although he does withhold taking it if he has a physician appointment or has somewhere to go. In the ED, the patient will had a low-grade temperature of 99.4 F.  He was hemodynamically stable with oxygen saturation 95 to 100% on room air.  WBC 4.4, hemoglobin 14.0, platelets 230,000.  Sodium 137, potassium 3.9, bicarbonate 38, serum creatinine 0.83.  BNP was 153.0.  Chest x-ray showed increased vascular congestion.  The patient was started on IV furosemide and admitted for further evaluation and treatment.    Assessment and Plan: * Acute on chronic combined systolic and diastolic CHF (congestive heart failure) (HCC) Spouse states that the patient weighed about 435 pounds about 9 months ago. Patient appears at least 20 pounds overweight Difficult to assess fluid status given the patient's body  habitus Obtain ReDs reading--unable due to weight limitations Accurate I/O--incomplete but about NEG 5L Continue IV Lasix 40 mg IV twice daily Continue metoprolol succinate>>stopped due to low BP Continue Entresto>>stopped due to low BP   Acute deep vein thrombosis (DVT) of popliteal vein of left lower extremity (HCC) Started apixaban 8/28 CTA chest--no PE  Elevated troponin Due demand ischemia is setting of decompensated CHF -no chest pain reported - Prior Myoview Lexiscan with no ischemia  Perirectal fistula Outpatient follow-up with general surgery Gluteal/perianal area not grossly infected on exam  Hyperlipidemia Continue statin  OSA (obstructive sleep apnea) Poor compliance at home with CPAP I have asked the patient to bring in his home CPAP machine ---he is able to wear it 3-4 hours  Morbid obesity with BMI of 60.0-69.9, adult (HCC) Lifestyle modification BMI 69.46  Pulmonary nodule, left Outpatient surveillance   Family Communication:   spouse updated 8/30   Consultants:  cardiology   Code Status:  FULL    DVT Prophylaxis:  apixaban     Procedures: As Listed in Progress Note Above   Antibiotics: None        Subjective: Feels that breathing and swelling slowly improving   Objective: Vitals:   09/04/21 0518 09/04/21 0718 09/04/21 1424 09/04/21 2042  BP: 113/65  102/70 90/65  Pulse: 77  72 87  Resp: Temp: (!) 97 F (36.1 C)  98.1 F (36.7 C) (!) 97.3 F (36.3 C)  TempSrc:   Oral   SpO2: 93% 94% 92% 94%  Weight: (!) 187.8 kg     Height:        Intake/Output Summary (Last 24 hours) at 09/04/2021 2256 Last data filed at 09/04/2021 2240 Gross per 24 hour  Intake 1080 ml  Output 1950 ml  Net -870 ml   Weight change: -4.536 kg Exam:  General:  Pt is alert, follows commands appropriately, not in acute distress HEENT: No icterus, No thrush, No neck mass, [place]/AT Cardiovascular: RRR, S1/S2, no rubs, no gallops Respiratory:  bibasilar rales. No wheeze Abdomen: Soft/+BS, non tender, non distended, no guarding Extremities: 2 + LE edema, No lymphangitis, No petechiae, No rashes, no synovitis   Data Reviewed: I have personally reviewed following labs and imaging studies Basic Metabolic Panel: Recent Labs  Lab 08/31/21 1144 09/01/21 0604 09/02/21 0446 09/03/21 0348 09/04/21 0254  NA 137 138 136 137 136  K 3.9 4.5 3.9 4.1 4.0  CL 100 96* 97* 97* 96*  CO2 30 36* 33* 33* 30  GLUCOSE 101* 105* 142* 98 118*  BUN CREATININE 0.83 0.90 1.11 0.93 1.01  CALCIUM 8.7* 8.8* 8.5* 8.7* 8.6*  MG  --   --   --   --  2.1   Liver Function Tests: No results for input(s): "AST", "ALT", "ALKPHOS", "BILITOT", "PROT", "ALBUMIN" in the last 168 hours. No results for input(s): "LIPASE", "AMYLASE" in the last 168 hours. No results for input(s): "AMMONIA" in the last 168 hours. Coagulation Profile: No results for input(s): "INR", "PROTIME" in the last 168 hours. CBC: Recent Labs  Lab 08/31/21 1144  WBC 4.4  NEUTROABS 2.2  HGB 14.0  HCT 44.8  MCV 91.6  PLT 138*   Cardiac Enzymes: No results for input(s): "CKTOTAL", "CKMB", "CKMBINDEX", "TROPONINI" in the last 168 hours. BNP: Invalid input(s): "POCBNP" CBG: No results for input(s): "GLUCAP" in the last 168 hours. HbA1C: No results for input(s): "HGBA1C" in the last 72 hours. Urine analysis:    Component Value Date/Time   COLORURINE YELLOW 06/04/2021 1324   APPEARANCEUR CLEAR 06/04/2021 1324   LABSPEC 1.016 06/04/2021 1324   PHURINE 7.0 06/04/2021 1324   GLUCOSEU NEGATIVE 06/04/2021 1324   HGBUR NEGATIVE 06/04/2021 1324   BILIRUBINUR NEGATIVE 06/04/2021 1324   BILIRUBINUR negative 08/26/2020 1558   BILIRUBINUR NEG 06/13/2015 1650   KETONESUR NEGATIVE 06/04/2021 1324   PROTEINUR NEGATIVE 06/04/2021 1324   UROBILINOGEN 0.2 08/26/2020 1558   UROBILINOGEN 1.0 09/26/2018 1619   NITRITE NEGATIVE 06/04/2021 1324   LEUKOCYTESUR NEGATIVE 06/04/2021  1324   Sepsis Labs: (procalcitonin:4,lacticidven:4) ) Recent Results (from the past 240 hour(s))  Resp Panel by RT-PCR (Flu A&B, Covid) Anterior Nasal Swab     Status: None   Collection Time: 08/31/21 12:10 PM   Specimen: Anterior Nasal Swab  Result Value Ref Range Status   SARS Coronavirus 2 by RT PCR NEGATIVE NEGATIVE Final    Comment: (NOTE) SARS-CoV-2 target nucleic acids are NOT DETECTED.  The SARS-CoV-2 RNA is generally detectable in upper respiratory specimens during the acute phase of infection. The lowest concentration of SARS-CoV-2 viral copies this assay can detect is 138 copies/mL. A negative result does not preclude SARS-Cov-2 infection and should not be used as the sole basis for treatment or other patient management decisions. A negative result may occur with  improper specimen collection/handling, submission of specimen other than nasopharyngeal swab, presence of viral mutation(s) within the areas targeted by this assay, and inadequate number of viral copies(<138 copies/mL). A negative result must be combined with  clinical observations, patient history, and epidemiological information. The expected result is Negative.  Fact Sheet for Patients:  BloggerCourse.com  Fact Sheet for Healthcare Providers:  SeriousBroker.it  This test is no t yet approved or cleared by the [place] FDA and  has been authorized for detection and/or diagnosis of SARS-CoV-2 by FDA under an Emergency Use Authorization (EUA). This EUA will remain  in effect (meaning this test can be used) for the duration of the COVID-19 declaration under Section 564(b)(1) of the Act, 21 U.S.C.section 360bbb-3(b)(1), unless the authorization is terminated  or revoked sooner.       Influenza A by PCR NEGATIVE NEGATIVE Final   Influenza B by PCR NEGATIVE NEGATIVE Final    Comment: (NOTE) The Xpert Xpress SARS-CoV-2/FLU/RSV plus assay is  intended as an aid in the diagnosis of influenza from Nasopharyngeal swab specimens and should not be used as a sole basis for treatment. Nasal washings and aspirates are unacceptable for Xpert Xpress SARS-CoV-2/FLU/RSV testing.  Fact Sheet for Patients: BloggerCourse.com  Fact Sheet for Healthcare Providers: SeriousBroker.it  This test is not yet approved or cleared by the [place] FDA and has been authorized for detection and/or diagnosis of SARS-CoV-2 by FDA under an Emergency Use Authorization (EUA). This EUA will remain in effect (meaning this test can be used) for the duration of the COVID-19 declaration under Section 564(b)(1) of the Act, 21 U.S.C. section 360bbb-3(b)(1), unless the authorization is terminated or revoked.  Performed at [facility], [address] [place], [place] [address]      Scheduled Meds:  apixaban  10 mg Oral BID   Followed by   Melene Muller ON 09/09/2021] apixaban  5 mg Oral BID   fluticasone furoate-vilanterol  1 puff Inhalation Daily   furosemide  60 mg Intravenous BID   potassium chloride  40 mEq Oral BID   rosuvastatin  20 mg Oral Daily   Continuous Infusions:  Procedures/Studies: CT Angio Chest Pulmonary Embolism (PE) W or WO Contrast  Addendum Date: 09/02/2021   ADDENDUM REPORT: 09/02/2021 15:49 ADDENDUM: Following should be added to the impression. There is a 5 mm nodule in left lower lung field. No follow-up needed if patient is low-risk.This recommendation follows the consensus statement: Guidelines for Management of Incidental Pulmonary Nodules Detected on CT Images: From the Fleischner Society 2017; Radiology 2017; 284:228-243. Electronically Signed   By: Ernie Avena M.D.   On: 09/02/2021 15:49   Result Date: 09/02/2021 CLINICAL DATA:  DVT lower extremity, positive D-dimer EXAM: CT ANGIOGRAPHY CHEST WITH CONTRAST TECHNIQUE: Multidetector CT imaging of the chest was performed using  the standard protocol during bolus administration of intravenous contrast. Multiplanar CT image reconstructions and MIPs were obtained to evaluate the vascular anatomy. RADIATION DOSE REDUCTION: This exam was performed according to the departmental dose-optimization program which includes automated exposure control, adjustment of the mA and/or kV according to patient size and/or use of iterative reconstruction technique. CONTRAST:  OMNIPAQUE IOHEXOL 350 MG/ML SOLN COMPARISON:  Previous studies including the chest radiograph done on 08/31/2021 FINDINGS: Cardiovascular: Heart is enlarged in size. The dilation of left ventricle. There is homogeneous enhancement in thoracic aorta. There are no intraluminal filling defects in central pulmonary artery branches. Evaluation of small peripheral branches in the lower lung fields is limited by infiltrates. Mediastinum/Nodes: Unremarkable. Lungs/Pleura: There are small linear patchy densities in both lower lung fields suggesting subsegmental atelectasis/pneumonia. In image 91 of series 6, 6 mm nodule is seen in left lower lobe. Minimal bilateral pleural effusions  are seen there is no pneumothorax. Upper Abdomen: There is fatty infiltration in liver. Musculoskeletal: No acute findings are seen. Review of the MIP images confirms the above findings. IMPRESSION: There is no evidence of central pulmonary artery embolism. Evaluation of small peripheral branches in the lower lung fields is limited by infiltrates. There is no evidence of thoracic aortic dissection. Small linear patchy densities in the lower lung fields suggest subsegmental atelectasis/pneumonia. Cardiomegaly.  Fatty liver. Electronically Signed: By: Ernie Avena M.D. On: 09/02/2021 15:02   US Venous Img Lower Bilateral (DVT)  Result Date: 09/02/2021 CLINICAL DATA:  Short of breath for 4 days, bilateral lower extremity edema EXAM: BILATERAL LOWER EXTREMITY VENOUS DOPPLER ULTRASOUND TECHNIQUE: Gray-scale  sonography with graded compression, as well as color Doppler and duplex ultrasound were performed to evaluate the lower extremity deep venous systems from the level of the common femoral vein and including the common femoral, femoral, profunda femoral, popliteal and calf veins including the posterior tibial, peroneal and gastrocnemius veins when visible. The superficial great saphenous vein was also interrogated. Spectral Doppler was utilized to evaluate flow at rest and with distal augmentation maneuvers in the common femoral, femoral and popliteal veins. COMPARISON:  None Available. FINDINGS: RIGHT LOWER EXTREMITY Common Femoral Vein: No evidence of thrombus. Normal compressibility, respiratory phasicity and response to augmentation. Saphenofemoral Junction: No evidence of thrombus. Normal compressibility and flow on color Doppler imaging. Profunda Femoral Vein: No evidence of thrombus. Normal compressibility and flow on color Doppler imaging. Femoral Vein: No evidence of thrombus. Normal compressibility, respiratory phasicity and response to augmentation. Popliteal Vein: No evidence of thrombus. Normal compressibility, respiratory phasicity and response to augmentation. Calf Veins: No gross evidence of thrombus. Evaluation limited due to body habitus and edema. Superficial Great Saphenous Vein: No evidence of thrombus. Normal compressibility. Other Findings:  None. LEFT LOWER EXTREMITY Common Femoral Vein: No evidence of thrombus. Normal compressibility, respiratory phasicity and response to augmentation. Saphenofemoral Junction: There is a small adherent thrombus at the saphenofemoral junction extending into the common femoral vein, which has an appearance most consistent with chronic DVT. No occlusive thrombus is identified. Profunda Femoral Vein: No evidence of thrombus. Normal compressibility and flow on color Doppler imaging. Femoral Vein: No evidence of thrombus. Normal compressibility, respiratory  phasicity and response to augmentation. Popliteal Vein: Occlusive thrombus within the left popliteal vein. The vessel is noncompressible, with no color flow identified. Calf Veins: Limited evaluation due to body habitus and subcutaneous edema. Superficial Great Saphenous Vein: No evidence of thrombus. Normal compressibility. Other Findings:  None. IMPRESSION: 1. Occlusive acute deep venous thrombosis of the left popliteal vein. 2. Suspected chronic nonocclusive thrombus within the left common femoral vein at the saphenofemoral junction. 3. No evidence of deep venous thrombosis within the right lower extremity. 4. Limited evaluation of the calf vessels due to body habitus and subcutaneous edema. These results will be called to the ordering clinician or representative by the Radiologist Assistant, and communication documented in the PACS or [employer]. Electronically Signed   By: Sharlet Salina M.D.   On: 09/02/2021 12:32   ECHOCARDIOGRAM COMPLETE  Result Date: 09/01/2021    ECHOCARDIOGRAM REPORT   Patient Name:   JODEY BURBANO Date of Exam: 09/01/2021 Medical Rec #:  [id number]     Height:       71.0 in Accession #:    [id number]    Weight:       498.0 lb Date of Birth:  04-07-1970      BSA:  3.107 m Patient Age:    51 years      BP:           110/81 mmHg Patient Gender: M             HR:           85 bpm. [facility] Procedure: 2D Echo, Color Doppler, Cardiac Doppler and Intracardiac            Opacification Agent Indications:    I50.9* Heart failure (unspecified)  History:        Patient has prior history of Echocardiogram examinations, most                 recent 03/06/2020. Risk Factors:Hypertension, Dyslipidemia and                 Sleep Apnea.  Sonographer:    Irving Burton Senior RDCS Referring Phys: [phone] JACOB J STINSON  Sonographer Comments: Technically difficult study due to poor echo windows. Technically difficult study with very poor echo windows due to morbid obesity. IMPRESSIONS  1.  Very technically difficult study with limited valve evaluation, Definity contrast given.  2. Left ventricular ejection fraction, by estimation, is 20 to 25%. Left ventricular ejection fraction by 2D MOD biplane is 25.0 %. The left ventricle has severely decreased function. The left ventricle demonstrates global hypokinesis. The left ventricular internal cavity size was moderately dilated. Left ventricular diastolic parameters are consistent with Grade I diastolic dysfunction (impaired relaxation).  3. Right ventricular systolic function was not well visualized. The right ventricular size is not well visualized.  4. The mitral valve was not well visualized. No evidence of mitral valve regurgitation.  5. The aortic valve was not well visualized. Aortic valve regurgitation is not visualized.  6. The inferior vena cava is dilated in size with <50% respiratory variability, suggesting right atrial pressure of 15 mmHg. Comparison(s): Changes from prior study are noted. 03/06/2020: LVEF 30-40%. FINDINGS  Left Ventricle: Left ventricular ejection fraction, by estimation, is 20 to 25%. Left ventricular ejection fraction by 2D MOD biplane is 25.0 %. The left ventricle has severely decreased function. The left ventricle demonstrates global hypokinesis. Definity contrast agent was given IV to delineate the left ventricular endocardial borders. The left ventricular internal cavity size was moderately dilated. Suboptimal image quality limits for assessment of left ventricular hypertrophy. Left ventricular  diastolic parameters are consistent with Grade I diastolic dysfunction (impaired relaxation). Indeterminate filling pressures. Right Ventricle: The right ventricular size is not well visualized. Right vetricular wall thickness was not well visualized. Right ventricular systolic function was not well visualized. Left Atrium: Left atrial size was normal in size. Right Atrium: Right atrial size was normal in size. Pericardium: There  is no evidence of pericardial effusion. Mitral Valve: The mitral valve was not well visualized. No evidence of mitral valve regurgitation. Tricuspid Valve: The tricuspid valve is not well visualized. Tricuspid valve regurgitation is not demonstrated. Aortic Valve: The aortic valve was not well visualized. Aortic valve regurgitation is not visualized. Pulmonic Valve: The pulmonic valve was not well visualized. Pulmonic valve regurgitation is not visualized. Aorta: The aortic root and ascending aorta are structurally normal, with no evidence of dilitation. Venous: The inferior vena cava is dilated in size with less than 50% respiratory variability, suggesting right atrial pressure of 15 mmHg. IAS/Shunts: No atrial level shunt detected by color flow Doppler.  LEFT VENTRICLE PLAX 2D  Biplane EF (MOD) LVOT diam:     2.30 cm         LV Biplane EF:   Left LV SV:         72                               ventricular LV SV Index:   23                               ejection LVOT Area:     4.15 cm                         fraction by                                                 2D MOD                                                 biplane is LV Volumes (MOD)                                25.0 %. LV vol d, MOD    192.0 ml A2C:                           Diastology LV vol d, MOD    237.0 ml      LV e' medial:    5.11 cm/s A4C:                           LV E/e' medial:  12.7 LV vol s, MOD    142.0 ml      LV e' lateral:   5.11 cm/s A2C:                           LV E/e' lateral: 12.7 LV vol s, MOD    186.0 ml A4C: LV SV MOD A2C:   50.0 ml LV SV MOD A4C:   237.0 ml LV SV MOD BP:    56.7 ml RIGHT VENTRICLE RV S prime:     12.60 cm/s TAPSE (M-mode): 2.3 cm LEFT ATRIUM             Index        RIGHT ATRIUM           Index LA Vol (A2C):   85.8 ml 27.61 ml/m  RA Area:     22.80 cm LA Vol (A4C):   70.2 ml 22.59 ml/m  RA Volume:   73.80 ml  23.75 ml/m LA Biplane Vol: 80.7 ml 25.97 ml/m  AORTIC VALVE LVOT  Vmax:   106.00 cm/s LVOT Vmean:  66.200 cm/s LVOT VTI:    0.174 m  AORTA Ao Root diam: 3.40 cm MITRAL VALVE MV Area (PHT): 3.65 cm    SHUNTS MV Decel Time: 208 msec    Systemic VTI:  0.17 m MV  E velocity: 65.10 cm/s  Systemic Diam: 2.30 cm MV A velocity: 84.40 cm/s MV E/A ratio:  0.77 Zoila Shutter MD Electronically signed by Zoila Shutter MD Signature Date/Time: 09/01/2021/12:30:05 PM    Final    DG Chest Portable 1 View  Result Date: 08/31/2021 CLINICAL DATA:  Shortness of breath EXAM: PORTABLE CHEST 1 VIEW COMPARISON:  11/12/2020 FINDINGS: Cardiomegaly and congestion of central vessels. There is no edema, consolidation, effusion, or pneumothorax. ACDF hardware IMPRESSION: Cardiomegaly and central vascular congestion. Electronically Signed   By: Tiburcio Pea M.D.   On: 08/31/2021 11:48    Erick Blinks, MD [facility]  If 7PM-7AM, please contact night-coverage [email or web address]  09/04/2021, 10:56 PM   LOS: 4 days

## 2021-09-05 DIAGNOSIS — I5020 Unspecified systolic (congestive) heart failure: Secondary | ICD-10-CM | POA: Diagnosis not present

## 2021-09-05 DIAGNOSIS — I82432 Acute embolism and thrombosis of left popliteal vein: Secondary | ICD-10-CM | POA: Diagnosis not present

## 2021-09-05 DIAGNOSIS — J9601 Acute respiratory failure with hypoxia: Secondary | ICD-10-CM | POA: Diagnosis not present

## 2021-09-05 DIAGNOSIS — I5043 Acute on chronic combined systolic (congestive) and diastolic (congestive) heart failure: Secondary | ICD-10-CM | POA: Diagnosis not present

## 2021-09-05 DIAGNOSIS — R778 Other specified abnormalities of plasma proteins: Secondary | ICD-10-CM | POA: Diagnosis not present

## 2021-09-05 LAB — BASIC METABOLIC PANEL
Anion gap: 7 (ref 5–15)
BUN: 17 mg/dL (ref 6–20)
CO2: 32 mmol/L (ref 22–32)
Calcium: 8.8 mg/dL — ABNORMAL LOW (ref 8.9–10.3)
Chloride: 99 mmol/L (ref 98–111)
Creatinine, Ser: 1.06 mg/dL (ref 0.61–1.24)
GFR, Estimated: >60 mL/min (ref >60)
Glucose, Bld: 101 mg/dL — ABNORMAL HIGH (ref 70–99)
Potassium: 4.1 mmol/L (ref 3.5–5.1)
Sodium: 138 mmol/L (ref 135–145)

## 2021-09-05 MED ORDER — FUROSEMIDE 10 MG/ML IJ SOLN
80.0000 mg | Freq: Two times a day (BID) | INTRAMUSCULAR | Status: DC
  Administered 2021-09-05 – 2021-09-09 (×8): 80 mg via INTRAVENOUS
  Filled 2021-09-05 (×8): qty 8

## 2021-09-05 NOTE — Progress Notes (Signed)
PROGRESS NOTE  Jordan Jensen ZOX:[id number] DOB: December 11, 1970 DOA: 08/31/2021 PCP: Donell Beers, FNP  Brief History:  Wt Readings from Last 3 Encounters:  08/01/21 (!) 480 lb (217.7 kg)  07/30/21 (!) 450 lb (204.1 kg)  06/04/21 (!) 460 lb (31.56 kg)  51 year old male with a history of traumatic myelopathy C6/7, morbid obesity, asthma, lymphedema, OSA, systolic CHF, hypertension, hyperlipidemia presenting with 3-day history of shortness of breath.  The patient's wife at the bedside who supplements the history.  She states that she has noted increasing peripheral edema and increasing abdominal girth at least for the past week.  The patient states that he has actually had worsening shortness of breath and orthopnea for the better part of at least a week.  He denies any fevers, chills, chest pain, headache, nausea, vomiting, diarrhea, abdominal pain. Patient states that he has been poorly compliant with CPAP at home.  In addition, he states that he continues to be only taking his Entresto once daily rather than twice daily.  He states that he has been taking furosemide 40 mg on a daily basis although he does withhold taking it if he has a physician appointment or has somewhere to go. In the ED, the patient will had a low-grade temperature of 99.4 F.  He was hemodynamically stable with oxygen saturation 95 to 100% on room air.  WBC 4.4, hemoglobin 14.0, platelets 230,000.  Sodium 137, potassium 3.9, bicarbonate 38, serum creatinine 0.83.  BNP was 153.0.  Chest x-ray showed increased vascular congestion.  The patient was started on IV furosemide and admitted for further evaluation and treatment.    Assessment and Plan: * Acute on chronic combined systolic and diastolic CHF (congestive heart failure) (HCC) Spouse states that the patient weighed about 435 pounds about 9 months ago. Patient appears at least 20 pounds overweight Difficult to assess fluid status given the patient's body  habitus Obtain ReDs reading--unable due to weight limitations Continues to have good urine output Continue IV Lasix 80 mg IV twice daily Continue metoprolol succinate>>stopped due to low BP Continue Entresto>>stopped due to low BP   Acute deep vein thrombosis (DVT) of popliteal vein of left lower extremity (HCC) Started apixaban 8/28 CTA chest--no PE  Elevated troponin Due demand ischemia is setting of decompensated CHF -no chest pain reported - Prior Myoview Lexiscan with no ischemia  Perirectal fistula Outpatient follow-up with general surgery Gluteal/perianal area not grossly infected on exam  Hyperlipidemia Continue statin  OSA (obstructive sleep apnea) Poor compliance at home with CPAP I have asked the patient to bring in his home CPAP machine ---he is able to wear it 3-4 hours  Morbid obesity with BMI of 60.0-69.9, adult (HCC) Lifestyle modification BMI 57.46  Pulmonary nodule, left Outpatient surveillance   Family Communication:   spouse updated 8/30   Consultants:  cardiology   Code Status:  FULL    DVT Prophylaxis:  apixaban     Procedures: As Listed in Progress Note Above   Antibiotics: None        Subjective:  No new complaints continues to feel better   Objective: Vitals:   09/04/21 2042 09/05/21 0402 09/05/21 0826 09/05/21 1417  BP: 90/65 (!) 107/49  (!) 113/99  Pulse: 87 (!) 47 (!) 59 92  Resp: Temp: (!) 97.3 F (36.3 C) (!) 97.1 F (36.2 C)  98.4 F (36.9 C)  TempSrc:    Oral  SpO2: 94% 94% 94% 97%  Weight:  (!) 186.9 kg    Height:        Intake/Output Summary (Last 24 hours) at 09/05/2021 1936 Last data filed at 09/05/2021 1900 Gross per 24 hour  Intake 720 ml  Output 2550 ml  Net -1830 ml   Weight change: -0.907 kg Exam:  General:  Pt is alert, follows commands appropriately, not in acute distress HEENT: No icterus, No thrush, No neck mass, /AT Cardiovascular: RRR, S1/S2, no rubs, no  gallops Respiratory: bibasilar rales. No wheeze Abdomen: Soft/+BS, non tender, non distended, no guarding Extremities: 2 + LE edema, No lymphangitis, No petechiae, No rashes, no synovitis   Data Reviewed: I have personally reviewed following labs and imaging studies Basic Metabolic Panel: Recent Labs  Lab 09/01/21 0604 09/02/21 0446 09/03/21 0348 09/04/21 0254 09/05/21 0418  NA 138 136 137 136 138  K 4.5 3.9 4.1 4.0 4.1  CL 96* 97* 97* 96* 99  CO2 36* 33* 33* 30 32  GLUCOSE 105* 142* 98 118* 101*  BUN 8 13 12 14 17   CREATININE 0.90 1.11 0.93 1.01 1.06  CALCIUM 8.8* 8.5* 8.7* 8.6* 8.8*  MG  --   --   --  2.1  --    Liver Function Tests: No results for input(s): "AST", "ALT", "ALKPHOS", "BILITOT", "PROT", "ALBUMIN" in the last 168 hours. No results for input(s): "LIPASE", "AMYLASE" in the last 168 hours. No results for input(s): "AMMONIA" in the last 168 hours. Coagulation Profile: No results for input(s): "INR", "PROTIME" in the last 168 hours. CBC: Recent Labs  Lab 08/31/21 1144  WBC 4.4  NEUTROABS 2.2  HGB 14.0  HCT 44.8  MCV 91.6  PLT 138*   Cardiac Enzymes: No results for input(s): "CKTOTAL", "CKMB", "CKMBINDEX", "TROPONINI" in the last 168 hours. BNP: Invalid input(s): "POCBNP" CBG: No results for input(s): "GLUCAP" in the last 168 hours. HbA1C: No results for input(s): "HGBA1C" in the last 72 hours. Urine analysis:    Component Value Date/Time   COLORURINE YELLOW 06/04/2021 1324   APPEARANCEUR CLEAR 06/04/2021 1324   LABSPEC 1.016 06/04/2021 1324   PHURINE 7.0 06/04/2021 1324   GLUCOSEU NEGATIVE 06/04/2021 1324   HGBUR NEGATIVE 06/04/2021 1324   BILIRUBINUR NEGATIVE 06/04/2021 1324   BILIRUBINUR negative 08/26/2020 1558   BILIRUBINUR NEG 06/13/2015 1650   KETONESUR NEGATIVE 06/04/2021 1324   PROTEINUR NEGATIVE 06/04/2021 1324   UROBILINOGEN 0.2 08/26/2020 1558   UROBILINOGEN 1.0 09/26/2018 1619   NITRITE NEGATIVE 06/04/2021 1324   LEUKOCYTESUR  NEGATIVE 06/04/2021 1324   Sepsis Labs: @LABRCNTIP (procalcitonin:4,lacticidven:4) ) Recent Results (from the past 240 hour(s))  Resp Panel by RT-PCR (Flu A&B, Covid) Anterior Nasal Swab     Status: None   Collection Time: 08/31/21 12:10 PM   Specimen: Anterior Nasal Swab  Result Value Ref Range Status   SARS Coronavirus 2 by RT PCR NEGATIVE NEGATIVE Final    Comment: (NOTE) SARS-CoV-2 target nucleic acids are NOT DETECTED.  The SARS-CoV-2 RNA is generally detectable in upper respiratory specimens during the acute phase of infection. The lowest concentration of SARS-CoV-2 viral copies this assay can detect is 138 copies/mL. A negative result does not preclude SARS-Cov-2 infection and should not be used as the sole basis for treatment or other patient management decisions. A negative result may occur with  improper specimen collection/handling, submission of specimen other than nasopharyngeal swab, presence of viral mutation(s) within the areas targeted by this assay, and inadequate number of viral copies(<138 copies/mL). A  negative result must be combined with clinical observations, patient history, and epidemiological information. The expected result is Negative.  Fact Sheet for Patients:  BloggerCourse.com  Fact Sheet for Healthcare Providers:  SeriousBroker.it  This test is no t yet approved or cleared by the [place] FDA and  has been authorized for detection and/or diagnosis of SARS-CoV-2 by FDA under an Emergency Use Authorization (EUA). This EUA will remain  in effect (meaning this test can be used) for the duration of the COVID-19 declaration under Section 564(b)(1) of the Act, 21 U.S.C.section 360bbb-3(b)(1), unless the authorization is terminated  or revoked sooner.       Influenza A by PCR NEGATIVE NEGATIVE Final   Influenza B by PCR NEGATIVE NEGATIVE Final    Comment: (NOTE) The Xpert Xpress  SARS-CoV-2/FLU/RSV plus assay is intended as an aid in the diagnosis of influenza from Nasopharyngeal swab specimens and should not be used as a sole basis for treatment. Nasal washings and aspirates are unacceptable for Xpert Xpress SARS-CoV-2/FLU/RSV testing.  Fact Sheet for Patients: BloggerCourse.com  Fact Sheet for Healthcare Providers: SeriousBroker.it  This test is not yet approved or cleared by the [place] FDA and has been authorized for detection and/or diagnosis of SARS-CoV-2 by FDA under an Emergency Use Authorization (EUA). This EUA will remain in effect (meaning this test can be used) for the duration of the COVID-19 declaration under Section 564(b)(1) of the Act, 21 U.S.C. section 360bbb-3(b)(1), unless the authorization is terminated or revoked.  Performed at [facility], [address], [place], [place] [address]      Scheduled Meds:  apixaban  10 mg Oral BID   Followed by   Melene Muller ON 09/09/2021] apixaban  5 mg Oral BID   fluticasone furoate-vilanterol  1 puff Inhalation Daily   furosemide  80 mg Intravenous BID   potassium chloride  40 mEq Oral BID   rosuvastatin  20 mg Oral Daily   Continuous Infusions:  Procedures/Studies: CT Angio Chest Pulmonary Embolism (PE) W or WO Contrast  Addendum Date: 09/02/2021   ADDENDUM REPORT: 09/02/2021 15:49 ADDENDUM: Following should be added to the impression. There is a 5 mm nodule in left lower lung field. No follow-up needed if patient is low-risk.This recommendation follows the consensus statement: Guidelines for Management of Incidental Pulmonary Nodules Detected on CT Images: From the Fleischner Society 2017; Radiology 2017; 284:228-243. Electronically Signed   By: Ernie Avena M.D.   On: 09/02/2021 15:49   Result Date: 09/02/2021 CLINICAL DATA:  DVT lower extremity, positive D-dimer EXAM: CT ANGIOGRAPHY CHEST WITH CONTRAST TECHNIQUE: Multidetector CT imaging  of the chest was performed using the standard protocol during bolus administration of intravenous contrast. Multiplanar CT image reconstructions and MIPs were obtained to evaluate the vascular anatomy. RADIATION DOSE REDUCTION: This exam was performed according to the departmental dose-optimization program which includes automated exposure control, adjustment of the mA and/or kV according to patient size and/or use of iterative reconstruction technique. CONTRAST:  OMNIPAQUE IOHEXOL 350 MG/ML SOLN COMPARISON:  Previous studies including the chest radiograph done on 08/31/2021 FINDINGS: Cardiovascular: Heart is enlarged in size. The dilation of left ventricle. There is homogeneous enhancement in thoracic aorta. There are no intraluminal filling defects in central pulmonary artery branches. Evaluation of small peripheral branches in the lower lung fields is limited by infiltrates. Mediastinum/Nodes: Unremarkable. Lungs/Pleura: There are small linear patchy densities in both lower lung fields suggesting subsegmental atelectasis/pneumonia. In image 91 of series 6, 6 mm nodule is seen in left  lower lobe. Minimal bilateral pleural effusions are seen there is no pneumothorax. Upper Abdomen: There is fatty infiltration in liver. Musculoskeletal: No acute findings are seen. Review of the MIP images confirms the above findings. IMPRESSION: There is no evidence of central pulmonary artery embolism. Evaluation of small peripheral branches in the lower lung fields is limited by infiltrates. There is no evidence of thoracic aortic dissection. Small linear patchy densities in the lower lung fields suggest subsegmental atelectasis/pneumonia. Cardiomegaly.  Fatty liver. Electronically Signed: By: Ernie Avena M.D. On: 09/02/2021 15:02   US Venous Img Lower Bilateral (DVT)  Result Date: 09/02/2021 CLINICAL DATA:  Short of breath for 4 days, bilateral lower extremity edema EXAM: BILATERAL LOWER EXTREMITY VENOUS DOPPLER  ULTRASOUND TECHNIQUE: Gray-scale sonography with graded compression, as well as color Doppler and duplex ultrasound were performed to evaluate the lower extremity deep venous systems from the level of the common femoral vein and including the common femoral, femoral, profunda femoral, popliteal and calf veins including the posterior tibial, peroneal and gastrocnemius veins when visible. The superficial great saphenous vein was also interrogated. Spectral Doppler was utilized to evaluate flow at rest and with distal augmentation maneuvers in the common femoral, femoral and popliteal veins. COMPARISON:  None Available. FINDINGS: RIGHT LOWER EXTREMITY Common Femoral Vein: No evidence of thrombus. Normal compressibility, respiratory phasicity and response to augmentation. Saphenofemoral Junction: No evidence of thrombus. Normal compressibility and flow on color Doppler imaging. Profunda Femoral Vein: No evidence of thrombus. Normal compressibility and flow on color Doppler imaging. Femoral Vein: No evidence of thrombus. Normal compressibility, respiratory phasicity and response to augmentation. Popliteal Vein: No evidence of thrombus. Normal compressibility, respiratory phasicity and response to augmentation. Calf Veins: No gross evidence of thrombus. Evaluation limited due to body habitus and edema. Superficial Great Saphenous Vein: No evidence of thrombus. Normal compressibility. Other Findings:  None. LEFT LOWER EXTREMITY Common Femoral Vein: No evidence of thrombus. Normal compressibility, respiratory phasicity and response to augmentation. Saphenofemoral Junction: There is a small adherent thrombus at the saphenofemoral junction extending into the common femoral vein, which has an appearance most consistent with chronic DVT. No occlusive thrombus is identified. Profunda Femoral Vein: No evidence of thrombus. Normal compressibility and flow on color Doppler imaging. Femoral Vein: No evidence of thrombus. Normal  compressibility, respiratory phasicity and response to augmentation. Popliteal Vein: Occlusive thrombus within the left popliteal vein. The vessel is noncompressible, with no color flow identified. Calf Veins: Limited evaluation due to body habitus and subcutaneous edema. Superficial Great Saphenous Vein: No evidence of thrombus. Normal compressibility. Other Findings:  None. IMPRESSION: 1. Occlusive acute deep venous thrombosis of the left popliteal vein. 2. Suspected chronic nonocclusive thrombus within the left common femoral vein at the saphenofemoral junction. 3. No evidence of deep venous thrombosis within the right lower extremity. 4. Limited evaluation of the calf vessels due to body habitus and subcutaneous edema. These results will be called to the ordering clinician or representative by the Radiologist Assistant, and communication documented in the PACS or [employer]. Electronically Signed   By: Sharlet Salina M.D.   On: 09/02/2021 12:32   ECHOCARDIOGRAM COMPLETE  Result Date: 09/01/2021    ECHOCARDIOGRAM REPORT   Patient Name:   RAYSHUN KANDLER Date of Exam: 09/01/2021 Medical Rec #:  [id number]     Height:       71.0 in Accession #:    [id number]    Weight:       498.0 lb Date of Birth:  [birth year]  BSA:          3.107 m Patient Age:    51 years      BP:           110/81 mmHg Patient Gender: M             HR:           85 bpm. [facility] Procedure: 2D Echo, Color Doppler, Cardiac Doppler and Intracardiac            Opacification Agent Indications:    I50.9* Heart failure (unspecified)  History:        Patient has prior history of Echocardiogram examinations, most                 recent 03/06/2020. Risk Factors:Hypertension, Dyslipidemia and                 Sleep Apnea.  Sonographer:    Irving Burton Senior RDCS Referring Phys: [phone] JACOB J STINSON  Sonographer Comments: Technically difficult study due to poor echo windows. Technically difficult study with very poor echo windows due to  morbid obesity. IMPRESSIONS  1. Very technically difficult study with limited valve evaluation, Definity contrast given.  2. Left ventricular ejection fraction, by estimation, is 20 to 25%. Left ventricular ejection fraction by 2D MOD biplane is 25.0 %. The left ventricle has severely decreased function. The left ventricle demonstrates global hypokinesis. The left ventricular internal cavity size was moderately dilated. Left ventricular diastolic parameters are consistent with Grade I diastolic dysfunction (impaired relaxation).  3. Right ventricular systolic function was not well visualized. The right ventricular size is not well visualized.  4. The mitral valve was not well visualized. No evidence of mitral valve regurgitation.  5. The aortic valve was not well visualized. Aortic valve regurgitation is not visualized.  6. The inferior vena cava is dilated in size with <50% respiratory variability, suggesting right atrial pressure of 15 mmHg. Comparison(s): Changes from prior study are noted. 03/06/2020: LVEF 30-40%. FINDINGS  Left Ventricle: Left ventricular ejection fraction, by estimation, is 20 to 25%. Left ventricular ejection fraction by 2D MOD biplane is 25.0 %. The left ventricle has severely decreased function. The left ventricle demonstrates global hypokinesis. Definity contrast agent was given IV to delineate the left ventricular endocardial borders. The left ventricular internal cavity size was moderately dilated. Suboptimal image quality limits for assessment of left ventricular hypertrophy. Left ventricular  diastolic parameters are consistent with Grade I diastolic dysfunction (impaired relaxation). Indeterminate filling pressures. Right Ventricle: The right ventricular size is not well visualized. Right vetricular wall thickness was not well visualized. Right ventricular systolic function was not well visualized. Left Atrium: Left atrial size was normal in size. Right Atrium: Right atrial size was  normal in size. Pericardium: There is no evidence of pericardial effusion. Mitral Valve: The mitral valve was not well visualized. No evidence of mitral valve regurgitation. Tricuspid Valve: The tricuspid valve is not well visualized. Tricuspid valve regurgitation is not demonstrated. Aortic Valve: The aortic valve was not well visualized. Aortic valve regurgitation is not visualized. Pulmonic Valve: The pulmonic valve was not well visualized. Pulmonic valve regurgitation is not visualized. Aorta: The aortic root and ascending aorta are structurally normal, with no evidence of dilitation. Venous: The inferior vena cava is dilated in size with less than 50% respiratory variability, suggesting right atrial pressure of 15 mmHg. IAS/Shunts: No atrial level shunt detected by color flow Doppler.  LEFT VENTRICLE PLAX 2D  Biplane EF (MOD) LVOT diam:     2.30 cm         LV Biplane EF:   Left LV SV:         72                               ventricular LV SV Index:   23                               ejection LVOT Area:     4.15 cm                         fraction by                                                 2D MOD                                                 biplane is LV Volumes (MOD)                                25.0 %. LV vol d, MOD    192.0 ml A2C:                           Diastology LV vol d, MOD    237.0 ml      LV e' medial:    5.11 cm/s A4C:                           LV E/e' medial:  12.7 LV vol s, MOD    142.0 ml      LV e' lateral:   5.11 cm/s A2C:                           LV E/e' lateral: 12.7 LV vol s, MOD    186.0 ml A4C: LV SV MOD A2C:   50.0 ml LV SV MOD A4C:   237.0 ml LV SV MOD BP:    56.7 ml RIGHT VENTRICLE RV S prime:     12.60 cm/s TAPSE (M-mode): 2.3 cm LEFT ATRIUM             Index        RIGHT ATRIUM           Index LA Vol (A2C):   85.8 ml 27.61 ml/m  RA Area:     22.80 cm LA Vol (A4C):   70.2 ml 22.59 ml/m  RA Volume:   73.80 ml  23.75 ml/m LA Biplane Vol: 80.7 ml  25.97 ml/m  AORTIC VALVE LVOT Vmax:   106.00 cm/s LVOT Vmean:  66.200 cm/s LVOT VTI:    0.174 m  AORTA Ao Root diam: 3.40 cm MITRAL VALVE MV Area (PHT): 3.65 cm    SHUNTS MV Decel Time: 208 msec    Systemic VTI:  0.17 m MV  E velocity: 65.10 cm/s  Systemic Diam: 2.30 cm MV A velocity: 84.40 cm/s MV E/A ratio:  0.77 Zoila Shutter MD Electronically signed by Zoila Shutter MD Signature Date/Time: 09/01/2021/12:30:05 PM    Final    DG Chest Portable 1 View  Result Date: 08/31/2021 CLINICAL DATA:  Shortness of breath EXAM: PORTABLE CHEST 1 VIEW COMPARISON:  11/12/2020 FINDINGS: Cardiomegaly and congestion of central vessels. There is no edema, consolidation, effusion, or pneumothorax. ACDF hardware IMPRESSION: Cardiomegaly and central vascular congestion. Electronically Signed   By: Tiburcio Pea M.D.   On: 08/31/2021 11:48    Erick Blinks, MD [facility]  If 7PM-7AM, please contact night-coverage [email or web address]  09/05/2021, 7:36 PM   LOS: 5 days

## 2021-09-05 NOTE — Progress Notes (Signed)
Progress Note  Patient Name: Jordan Jensen Date of Encounter: 09/05/2021  Primary Cardiologist: Donato Schultz, MD  Subjective   Reports that leg swelling and tightness as well as abdominal tightness has been improving.  Inpatient Medications    Scheduled Meds:  apixaban  10 mg Oral BID   Followed by   Melene Muller ON 09/09/2021] apixaban  5 mg Oral BID   fluticasone furoate-vilanterol  1 puff Inhalation Daily   furosemide  60 mg Intravenous BID   potassium chloride  40 mEq Oral BID   rosuvastatin  20 mg Oral Daily    PRN Meds: albuterol, diclofenac Sodium, ondansetron **OR** ondansetron (ZOFRAN) IV, sodium chloride   Vital Signs    Vitals:   09/04/21 1424 09/04/21 2042 09/05/21 0402 09/05/21 0826  BP: 102/70 90/65 (!) 107/49   Pulse: 72 87 (!) 47 (!) 59  Resp: 18 18 20 18   Temp: 98.1 F (36.7 C) (!) 97.3 F (36.3 C) (!) 97.1 F (36.2 C)   TempSrc: Oral     SpO2: 92% 94% 94% 94%  Weight:   (!) 186.9 kg   Height:        Intake/Output Summary (Last 24 hours) at 09/05/2021 1033 Last data filed at 09/05/2021 0900 Gross per 24 hour  Intake 1080 ml  Output 1750 ml  Net -670 ml   Filed Weights   09/03/21 0500 09/04/21 0518 09/05/21 0402  Weight: (!) 192.3 kg (!) 187.8 kg (!) 186.9 kg    Telemetry    Sinus rhythm.  Personally reviewed.  ECG    No ECG reviewed.  Physical Exam   GEN: No acute distress.   Neck: Difficult to assess JVP. Cardiac: RRR, no murmur, rub, or gallop.  Respiratory: Nonlabored. Clear to auscultation bilaterally. GI: Obese, bowel sounds present. MS: Improving leg edema, left greater than right. Neuro:  Nonfocal. Psych: Alert and oriented x 3. Normal affect.  Labs    Chemistry Recent Labs  Lab 09/03/21 0348 09/04/21 0254 09/05/21 0418  NA 137 136 138  K 4.1 4.0 4.1  CL 97* 96* 99  CO2 33* 30 32  GLUCOSE 98 118* 101*  BUN 12 14 17   CREATININE 0.93 1.01 1.06  CALCIUM 8.7* 8.6* 8.8*  GFRNONAA >60 >60 >60  ANIONGAP 7 10 7       Hematology Recent Labs  Lab 08/31/21 1144  WBC 4.4  RBC 4.89  HGB 14.0  HCT 44.8  MCV 91.6  MCH 28.6  MCHC 31.3  RDW 13.6  PLT 138*    Cardiac Enzymes Recent Labs  Lab 08/31/21 1144 08/31/21 1341  TROPONINIHS 48* 51*    BNP Recent Labs  Lab 08/31/21 1144  BNP 153.0*     DDimer Recent Labs  Lab 09/02/21 0446  DDIMER 1.83*     Radiology    No results found.  Cardiac Studies   Echocardiogram 09/01/2021:  1. Very technically difficult study with limited valve evaluation,  Definity contrast given.   2. Left ventricular ejection fraction, by estimation, is 20 to 25%. Left  ventricular ejection fraction by 2D MOD biplane is 25.0 %. The left  ventricle has severely decreased function. The left ventricle demonstrates  global hypokinesis. The left  ventricular internal cavity size was moderately dilated. Left ventricular  diastolic parameters are consistent with Grade I diastolic dysfunction  (impaired relaxation).   3. Right ventricular systolic function was not well visualized. The right  ventricular size is not well visualized.   4. The mitral valve  was not well visualized. No evidence of mitral valve  regurgitation.   5. The aortic valve was not well visualized. Aortic valve regurgitation  is not visualized.   6. The inferior vena cava is dilated in size with <50% respiratory  variability, suggesting right atrial pressure of 15 mmHg.   Assessment & Plan    1.  Acute on chronic HFrEF, LVEF 20 to 25% range with global hypokinesis at this point.  Body habitus makes assessment of volume status difficult.  BNP 153, high-sensitivity troponin I levels argue against ACS.  Currently on Lasix 60 mg IV twice daily with potassium supplement.  Net output of approximately 1100 cc last 24 hours.  Renal function stable.  2.  Acute occlusive DVT along the left popliteal vein and suspected chronic nonocclusive thrombus along the left common femoral vein. Chest CTA  negative for PE. He is on Eliquis 10mg  BID for anticoagulation per primary team.  3.  Relative hypotension, taken off Toprol-XL and Entresto at this point.  Recent systolics ranging 100-110.  4.  Morbid obesity.  5.  OSA with intermittent use of CPAP.  Increase Lasix to 80 mg IV twice daily, continue potassium supplement.  He is on Eliquis for treatment of acute DVT.  Current vital signs preclude readdition of beta-blocker or ARNI.  Not a good candidate for SGLT2 inhibitor given body habitus and increased risk of infection.  May want to place Foley catheter with increased diuresis.  Would plan to optimize fluid status based on diuretic response and follow-up of renal function.  At that point may be able to add back low-dose beta-blocker and consider conversion of oral diuretic regimen to [facility] as an outpatient.  Signed, [facility], MD  09/05/2021, 10:33 AM

## 2021-09-06 DIAGNOSIS — I5043 Acute on chronic combined systolic (congestive) and diastolic (congestive) heart failure: Secondary | ICD-10-CM | POA: Diagnosis not present

## 2021-09-06 DIAGNOSIS — J9601 Acute respiratory failure with hypoxia: Secondary | ICD-10-CM | POA: Diagnosis not present

## 2021-09-06 LAB — BASIC METABOLIC PANEL
Anion gap: 8 (ref 5–15)
BUN: 18 mg/dL (ref 6–20)
CO2: 32 mmol/L (ref 22–32)
Calcium: 9 mg/dL (ref 8.9–10.3)
Chloride: 99 mmol/L (ref 98–111)
Creatinine, Ser: 0.99 mg/dL (ref 0.61–1.24)
GFR, Estimated: >60 mL/min (ref >60)
Glucose, Bld: 113 mg/dL — ABNORMAL HIGH (ref 70–99)
Potassium: 3.8 mmol/L (ref 3.5–5.1)
Sodium: 139 mmol/L (ref 135–145)

## 2021-09-06 LAB — MAGNESIUM: Magnesium: 2.2 mg/dL (ref 1.7–2.4)

## 2021-09-06 MED ORDER — METOPROLOL SUCCINATE ER 25 MG PO TB24: 12.5000 mg | ORAL_TABLET | Freq: Every day | ORAL | Status: DC

## 2021-09-06 MED ORDER — POTASSIUM CHLORIDE CRYS ER 20 MEQ PO TBCR
40.0000 meq | EXTENDED_RELEASE_TABLET | Freq: Once | ORAL | Status: AC
  Administered 2021-09-06: 40 meq via ORAL
  Filled 2021-09-06: qty 2

## 2021-09-06 MED ORDER — METOPROLOL SUCCINATE ER 25 MG PO TB24
25.0000 mg | ORAL_TABLET | Freq: Every day | ORAL | Status: DC
  Administered 2021-09-06 – 2021-09-10 (×5): 25 mg via ORAL
  Filled 2021-09-06 (×5): qty 1

## 2021-09-06 NOTE — Evaluation (Signed)
Physical Therapy Evaluation Patient Details Name: Jordan Jensen MRN: [id number] DOB: [birth year] Today's Date: 09/06/2021  History of Present Illness  Jordan Jensen is a 51 y.o. male with medical history significant of combined systolic and diastolic heart failure, posttraumatic myelopathy with lower extremity paralysis, GERD, hypertension, morbid obesity, sleep apnea.  Patient presents with worsening shortness of breath over the past 2 days.  His legs are chronically swollen and has not noticed any increased difficulty.  He has been compliant with his medications.  No palliating or provoking factors.   Clinical Impression  Patient demonstrates slow labored movement for sitting up at bedside requiring Mod assist due difficulty balancing self with BUE and unable to move legs to bedside due to weakness.  Patient able to maintain sitting balance once seated with frequent support of BUE, unable to stand due to weakness or scoot laterally due to being stuck in air mattress bed.  Patient required Mod/max to move BLE when put back to bed and able to pull self up in bed using BUE, but poor carryover attempting to use BLE due to stiffness in knees and unable to actively flex knees.  Patient will benefit from continued skilled physical therapy in hospital and recommended venue below to increase strength, balance, endurance for safe ADLs and gait.         Recommendations for follow up therapy are one component of a multi-disciplinary discharge planning process, led by the attending physician.  Recommendations may be updated based on patient status, additional functional criteria and insurance authorization.  Follow Up Recommendations Home health PT      Assistance Recommended at Discharge Set up Supervision/Assistance  Patient can return home with the following  A lot of help with bathing/dressing/bathroom;A lot of help with walking and/or transfers;Help with stairs or ramp for entrance;Assistance with  cooking/housework;Two people to help with walking and/or transfers    Equipment Recommendations None recommended by PT  Recommendations for Other Services       Functional Status Assessment Patient has had a recent decline in their functional status and/or demonstrates limited ability to make significant improvements in function in a reasonable and predictable amount of time     Precautions / Restrictions Precautions Precautions: Fall Restrictions Weight Bearing Restrictions: No      Mobility  Bed Mobility Overal bed mobility: Needs Assistance Bed Mobility: Supine to Sit, Sit to Supine     Supine to sit: Mod assist Sit to supine: Mod assist, Max assist   General bed mobility comments: diffiuclty using hands during supine to sitting    Transfers                        Ambulation/Gait                  Stairs            Wheelchair Mobility    Modified Rankin (Stroke Patients Only)       Balance Overall balance assessment: Needs assistance Sitting-balance support: Feet supported, No upper extremity supported Sitting balance-Leahy Scale: Fair Sitting balance - Comments: fair/good seated at EOB                                     Pertinent Vitals/Pain Pain Assessment Pain Assessment: No/denies pain    Home Living Family/patient expects to be discharged to:: Private residence Living Arrangements: Spouse/significant other Available  Help at Discharge: Family;Available PRN/intermittently Type of Home: House Home Access: Ramped entrance       Home Layout: One level Home Equipment: Wheelchair - manual;Crutches;BSC/3in1 (hoyer lift) Additional Comments: does not have access to bathroom due to wheelchair to wide, has hoyer lift    Prior Function Prior Level of Function : Needs assist       Physical Assist : Mobility (physical);ADLs (physical) Mobility (physical): Bed mobility;Transfers;Gait;Stairs   Mobility Comments:  mostly assisted transfers to wheelchair, uses wheelchair for mobility, non-ambulatory x 6 months ADLs Comments: assisted by family     Hand Dominance   Dominant Hand: Right    Extremity/Trunk Assessment   Upper Extremity Assessment Upper Extremity Assessment: Generalized weakness    Lower Extremity Assessment Lower Extremity Assessment: Generalized weakness;RLE deficits/detail;LLE deficits/detail RLE Deficits / Details: grossly -3/5, very spastic with difficulty bending knees other when seated at bedside RLE: Unable to fully assess due to immobilization RLE Sensation: WNL;decreased proprioception RLE Coordination: decreased gross motor;decreased fine motor LLE Deficits / Details: grossly -3/5, very spastic with difficulty bending knees other when seated at bedside LLE: Unable to fully assess due to immobilization LLE Sensation: decreased proprioception LLE Coordination: decreased fine motor;decreased gross motor    Cervical / Trunk Assessment Cervical / Trunk Assessment: Normal  Communication   Communication: No difficulties  Cognition Arousal/Alertness: Awake/alert Behavior During Therapy: WFL for tasks assessed/performed Overall Cognitive Status: Within Functional Limits for tasks assessed                                          General Comments      Exercises     Assessment/Plan    PT Assessment Patient needs continued PT services  PT Problem List Decreased strength;Decreased activity tolerance;Decreased balance;Decreased mobility       PT Treatment Interventions DME instruction;Functional mobility training;Therapeutic activities;Therapeutic exercise;Patient/family education;Balance training;Wheelchair mobility training    PT Goals (Current goals can be found in the Care Plan section)  Acute Rehab PT Goals Patient Stated Goal: return home with family to assist PT Goal Formulation: With patient Time For Goal Achievement: 09/10/21 Potential  to Achieve Goals: Good    Frequency Min 3X/week     Co-evaluation               AM-PAC PT "6 Clicks" Mobility  Outcome Measure Help needed turning from your back to your side while in a flat bed without using bedrails?: A Lot Help needed moving from lying on your back to sitting on the side of a flat bed without using bedrails?: A Lot Help needed moving to and from a bed to a chair (including a wheelchair)?: Total Help needed standing up from a chair using your arms (e.g., wheelchair or bedside chair)?: Total Help needed to walk in hospital room?: Total Help needed climbing 3-5 steps with a railing? : Total 6 Click Score: 8    End of Session   Activity Tolerance: Patient tolerated treatment well;Patient limited by fatigue Patient left: in bed;with call bell/phone within reach Nurse Communication: Mobility status PT Visit Diagnosis: Unsteadiness on feet (R26.81);Other abnormalities of gait and mobility (R26.89);Muscle weakness (generalized) (M62.81)    Time: 3810-1751 PT Time Calculation (min) (ACUTE ONLY): 33 min   Charges:   PT Evaluation $PT Eval Moderate Complexity: 1 Mod PT Treatments $Therapeutic Activity: 23-37 mins        3:53 PM, 09/06/21 Fayrene Fearing  Chrissie Noa, MPT Physical Therapist with [facility] 336 [phone] office [phone] mobile phone

## 2021-09-06 NOTE — Progress Notes (Addendum)
Progress Note  Patient Name: Jordan Jensen Date of Encounter: 09/06/2021  Primary Cardiologist: Donato Schultz, MD  Subjective   Lasix increased yesterday - net negative about 2L, now about 8.5L negative. Creatinine minimally improved to 0.99. 15 beat run of NSVT - asymptomatic. Electrolytes are normal.  Inpatient Medications    Scheduled Meds:  apixaban  10 mg Oral BID   Followed by   Melene Muller ON 09/09/2021] apixaban  5 mg Oral BID   fluticasone furoate-vilanterol  1 puff Inhalation Daily   furosemide  80 mg Intravenous BID   potassium chloride  40 mEq Oral BID   potassium chloride  40 mEq Oral Once   rosuvastatin  20 mg Oral Daily    PRN Meds: albuterol, diclofenac Sodium, ondansetron **OR** ondansetron (ZOFRAN) IV, sodium chloride   Vital Signs    Vitals:   09/05/21 1417 09/05/21 2115 09/06/21 0615 09/06/21 0713  BP: (!) 113/99 96/81 103/77   Pulse: 92 82 79   Resp: 20 16 20    Temp: 98.4 F (36.9 C) 98.4 F (36.9 C) 98.5 F (36.9 C)   TempSrc: Oral  Oral   SpO2: 97% 94% 91% 94%  Weight:   (!) 187.8 kg   Height:        Intake/Output Summary (Last 24 hours) at 09/06/2021 0850 Last data filed at 09/06/2021 0700 Gross per 24 hour  Intake 720 ml  Output 2650 ml  Net -1930 ml   Filed Weights   09/04/21 0518 09/05/21 0402 09/06/21 0615  Weight: (!) 187.8 kg (!) 186.9 kg (!) 187.8 kg    Telemetry    Sinus rhythm.  Personally reviewed.  ECG    No ECG reviewed.  Physical Exam   GEN: No acute distress.   Neck: Difficult to assess JVP. Cardiac: RRR, no murmur, rub, or gallop.  Respiratory: Nonlabored. Clear to auscultation bilaterally. GI: Obese, bowel sounds present. MS: Improving leg edema, left greater than right. Neuro:  Nonfocal. Psych: Alert and oriented x 3. Normal affect.  Labs    Chemistry Recent Labs  Lab 09/04/21 0254 09/05/21 0418 09/06/21 0319  NA 136 138 139  K 4.0 4.1 3.8  CL 96* 99 99  CO2 30 32 32  GLUCOSE 118* 101* 113*  BUN 14  17 18   CREATININE 1.01 1.06 0.99  CALCIUM 8.6* 8.8* 9.0  GFRNONAA >60 >60 >60  ANIONGAP 10 7 8      Hematology Recent Labs  Lab 08/31/21 1144  WBC 4.4  RBC 4.89  HGB 14.0  HCT 44.8  MCV 91.6  MCH 28.6  MCHC 31.3  RDW 13.6  PLT 138*    Cardiac Enzymes Recent Labs  Lab 08/31/21 1144 08/31/21 1341  TROPONINIHS 48* 51*    BNP Recent Labs  Lab 08/31/21 1144  BNP 153.0*     DDimer Recent Labs  Lab 09/02/21 0446  DDIMER 1.83*     Radiology    No results found.  Cardiac Studies   Echocardiogram 09/01/2021:  1. Very technically difficult study with limited valve evaluation,  Definity contrast given.   2. Left ventricular ejection fraction, by estimation, is 20 to 25%. Left  ventricular ejection fraction by 2D MOD biplane is 25.0 %. The left  ventricle has severely decreased function. The left ventricle demonstrates  global hypokinesis. The left  ventricular internal cavity size was moderately dilated. Left ventricular  diastolic parameters are consistent with Grade I diastolic dysfunction  (impaired relaxation).   3. Right ventricular systolic function was  not well visualized. The right  ventricular size is not well visualized.   4. The mitral valve was not well visualized. No evidence of mitral valve  regurgitation.   5. The aortic valve was not well visualized. Aortic valve regurgitation  is not visualized.   6. The inferior vena cava is dilated in size with <50% respiratory  variability, suggesting right atrial pressure of 15 mmHg.   Assessment & Plan    1.  Acute on chronic HFrEF, LVEF 20 to 25% range with global hypokinesis at this point.  Body habitus makes assessment of volume status difficult.  BNP 153, high-sensitivity troponin I levels argue against ACS.  Currently on Lasix 60 mg IV twice daily with potassium supplement.  Net output of approximately 1100 cc last 24 hours.  Renal function stable.  -Continue IV lasix today. May be able to  transition to oral torsemide over the weekend. Dose would likely be 40 mg po BID.  2.  Acute occlusive DVT along the left popliteal vein and suspected chronic nonocclusive thrombus along the left common femoral vein. Chest CTA negative for PE. He is on Eliquis 10mg  BID for anticoagulation per primary team.  3.  Relative hypotension, taken off Toprol-XL and Entresto at this point.  Recent systolics ranging 100-110.  4.  Morbid obesity.  5.  OSA with intermittent use of CPAP.  6. NSVT - 15 beats, asymptomatic. Restart Toprol XL 25 mg daily.  , MD, [facility], FACP  [place]  [facility]  Medical Director of the [facility]  [facility] Diplomate of the American Board of Clinical Lipidology Attending Cardiologist  Direct Dial: [phone]  Fax: [phone]  Website:  [email or web address]  [phone], MD  09/06/2021, 8:50 AM

## 2021-09-06 NOTE — Progress Notes (Signed)
PROGRESS NOTE  Jordan Jensen RKY:[id number] DOB: 1970/02/26 DOA: 08/31/2021 PCP: Donell Beers, FNP  Brief History:  Wt Readings from Last 3 Encounters:  08/01/21 (!) 480 lb (217.7 kg)  07/30/21 (!) 450 lb (204.1 kg)  06/04/21 (!) 460 lb (59.17 kg)  51 year old male with a history of traumatic myelopathy C6/7, morbid obesity, asthma, lymphedema, OSA, systolic CHF, hypertension, hyperlipidemia presenting with 3-day history of shortness of breath.  The patient's wife at the bedside who supplements the history.  She states that she has noted increasing peripheral edema and increasing abdominal girth at least for the past week.  The patient states that he has actually had worsening shortness of breath and orthopnea for the better part of at least a week.  He denies any fevers, chills, chest pain, headache, nausea, vomiting, diarrhea, abdominal pain. Patient states that he has been poorly compliant with CPAP at home.  In addition, he states that he continues to be only taking his Entresto once daily rather than twice daily.  He states that he has been taking furosemide 40 mg on a daily basis although he does withhold taking it if he has a physician appointment or has somewhere to go. In the ED, the patient will had a low-grade temperature of 99.4 F.  He was hemodynamically stable with oxygen saturation 95 to 100% on room air.  WBC 4.4, hemoglobin 14.0, platelets 230,000.  Sodium 137, potassium 3.9, bicarbonate 38, serum creatinine 0.83.  BNP was 153.0.  Chest x-ray showed increased vascular congestion.  The patient was started on IV furosemide and admitted for further evaluation and treatment.    Assessment and Plan: * Acute on chronic combined systolic and diastolic CHF (congestive heart failure) (HCC) Spouse states that the patient weighed about 435 pounds about 9 months ago. Patient appears at least 20 pounds overweight Difficult to assess fluid status given the patient's body  habitus Obtain ReDs reading--unable due to weight limitations Continues to have good urine output Continue IV Lasix 80 mg IV twice daily Restart toprol Continue Entresto>>stopped due to low BP   Acute deep vein thrombosis (DVT) of popliteal vein of left lower extremity (HCC) Started apixaban 8/28 CTA chest--no PE  Elevated troponin Due demand ischemia is setting of decompensated CHF -no chest pain reported - Prior Myoview Lexiscan with no ischemia  Perirectal fistula Outpatient follow-up with general surgery Gluteal/perianal area not grossly infected on exam  Hyperlipidemia Continue statin  OSA (obstructive sleep apnea) Poor compliance at home with CPAP I have asked the patient to bring in his home CPAP machine ---he is able to wear it 3-4 hours  Morbid obesity with BMI of 60.0-69.9, adult (HCC) Lifestyle modification BMI 57.46  Pulmonary nodule, left Outpatient surveillance   Family Communication:   spouse updated 8/30   Consultants:  cardiology   Code Status:  FULL    DVT Prophylaxis:  apixaban     Procedures: As Listed in Progress Note Above   Antibiotics: None        Subjective:  Continues to feel better.  Shortness of breath improving.   Objective: Vitals:   09/06/21 0713 09/06/21 0946 09/06/21 1343 09/06/21 1457  BP:  (!) 123/104 (!) 90/59   Pulse:  99 79 79  Resp:      Temp:   98.6 F (37 C)   TempSrc:   Oral   SpO2: 94%  (!) 89% 97%  Weight:  Height:        Intake/Output Summary (Last 24 hours) at 09/06/2021 1920 Last data filed at 09/06/2021 1700 Gross per 24 hour  Intake 720 ml  Output 2400 ml  Net -1680 ml   Weight change: 0.907 kg Exam:  General:  Pt is alert, follows commands appropriately, not in acute distress HEENT: No icterus, No thrush, No neck mass, [place]/AT Cardiovascular: RRR, S1/S2, no rubs, no gallops Respiratory: bibasilar rales. No wheeze Abdomen: Soft/+BS, non tender, non distended, no  guarding Extremities: 2 + LE edema, No lymphangitis, No petechiae, No rashes, no synovitis   Data Reviewed: I have personally reviewed following labs and imaging studies Basic Metabolic Panel: Recent Labs  Lab 09/02/21 0446 09/03/21 0348 09/04/21 0254 09/05/21 0418 09/06/21 0319 09/06/21 0800  NA 136 137 136 138 139  --   K 3.9 4.1 4.0 4.1 3.8  --   CL 97* 97* 96* 99 99  --   CO2 33* 33* 30 32 32  --   GLUCOSE 142* 98 118* 101* 113*  --   BUN 13 12 14 17 18   --   CREATININE 1.11 0.93 1.01 1.06 0.99  --   CALCIUM 8.5* 8.7* 8.6* 8.8* 9.0  --   MG  --   --  2.1  --   --  2.2   Liver Function Tests: No results for input(s): "AST", "ALT", "ALKPHOS", "BILITOT", "PROT", "ALBUMIN" in the last 168 hours. No results for input(s): "LIPASE", "AMYLASE" in the last 168 hours. No results for input(s): "AMMONIA" in the last 168 hours. Coagulation Profile: No results for input(s): "INR", "PROTIME" in the last 168 hours. CBC: Recent Labs  Lab 08/31/21 1144  WBC 4.4  NEUTROABS 2.2  HGB 14.0  HCT 44.8  MCV 91.6  PLT 138*   Cardiac Enzymes: No results for input(s): "CKTOTAL", "CKMB", "CKMBINDEX", "TROPONINI" in the last 168 hours. BNP: Invalid input(s): "POCBNP" CBG: No results for input(s): "GLUCAP" in the last 168 hours. HbA1C: No results for input(s): "HGBA1C" in the last 72 hours. Urine analysis:    Component Value Date/Time   COLORURINE YELLOW 06/04/2021 1324   APPEARANCEUR CLEAR 06/04/2021 1324   LABSPEC 1.016 06/04/2021 1324   PHURINE 7.0 06/04/2021 1324   GLUCOSEU NEGATIVE 06/04/2021 1324   HGBUR NEGATIVE 06/04/2021 1324   BILIRUBINUR NEGATIVE 06/04/2021 1324   BILIRUBINUR negative 08/26/2020 1558   BILIRUBINUR NEG 06/13/2015 1650   KETONESUR NEGATIVE 06/04/2021 1324   PROTEINUR NEGATIVE 06/04/2021 1324   UROBILINOGEN 0.2 08/26/2020 1558   UROBILINOGEN 1.0 09/26/2018 1619   NITRITE NEGATIVE 06/04/2021 1324   LEUKOCYTESUR NEGATIVE 06/04/2021 1324   Sepsis  Labs: @LABRCNTIP (procalcitonin:4,lacticidven:4) ) Recent Results (from the past 240 hour(s))  Resp Panel by RT-PCR (Flu A&B, Covid) Anterior Nasal Swab     Status: None   Collection Time: 08/31/21 12:10 PM   Specimen: Anterior Nasal Swab  Result Value Ref Range Status   SARS Coronavirus 2 by RT PCR NEGATIVE NEGATIVE Final    Comment: (NOTE) SARS-CoV-2 target nucleic acids are NOT DETECTED.  The SARS-CoV-2 RNA is generally detectable in upper respiratory specimens during the acute phase of infection. The lowest concentration of SARS-CoV-2 viral copies this assay can detect is 138 copies/mL. A negative result does not preclude SARS-Cov-2 infection and should not be used as the sole basis for treatment or other patient management decisions. A negative result may occur with  improper specimen collection/handling, submission of specimen other than nasopharyngeal swab, presence of viral mutation(s) within the  areas targeted by this assay, and inadequate number of viral copies(<138 copies/mL). A negative result must be combined with clinical observations, patient history, and epidemiological information. The expected result is Negative.  Fact Sheet for Patients:  BloggerCourse.com  Fact Sheet for Healthcare Providers:  SeriousBroker.it  This test is no t yet approved or cleared by the [place] FDA and  has been authorized for detection and/or diagnosis of SARS-CoV-2 by FDA under an Emergency Use Authorization (EUA). This EUA will remain  in effect (meaning this test can be used) for the duration of the COVID-19 declaration under Section 564(b)(1) of the Act, 21 U.S.C.section 360bbb-3(b)(1), unless the authorization is terminated  or revoked sooner.       Influenza A by PCR NEGATIVE NEGATIVE Final   Influenza B by PCR NEGATIVE NEGATIVE Final    Comment: (NOTE) The Xpert Xpress SARS-CoV-2/FLU/RSV plus assay is intended as an  aid in the diagnosis of influenza from Nasopharyngeal swab specimens and should not be used as a sole basis for treatment. Nasal washings and aspirates are unacceptable for Xpert Xpress SARS-CoV-2/FLU/RSV testing.  Fact Sheet for Patients: BloggerCourse.com  Fact Sheet for Healthcare Providers: SeriousBroker.it  This test is not yet approved or cleared by the [place] FDA and has been authorized for detection and/or diagnosis of SARS-CoV-2 by FDA under an Emergency Use Authorization (EUA). This EUA will remain in effect (meaning this test can be used) for the duration of the COVID-19 declaration under Section 564(b)(1) of the Act, 21 U.S.C. section 360bbb-3(b)(1), unless the authorization is terminated or revoked.  Performed at [facility], [address] [place], [place] [address]      Scheduled Meds:  apixaban  10 mg Oral BID   Followed by   Melene Muller ON 09/09/2021] apixaban  5 mg Oral BID   fluticasone furoate-vilanterol  1 puff Inhalation Daily   furosemide  80 mg Intravenous BID   metoprolol succinate  25 mg Oral Daily   potassium chloride  40 mEq Oral BID   rosuvastatin  20 mg Oral Daily   Continuous Infusions:  Procedures/Studies: CT Angio Chest Pulmonary Embolism (PE) W or WO Contrast  Addendum Date: 09/02/2021   ADDENDUM REPORT: 09/02/2021 15:49 ADDENDUM: Following should be added to the impression. There is a 5 mm nodule in left lower lung field. No follow-up needed if patient is low-risk.This recommendation follows the consensus statement: Guidelines for Management of Incidental Pulmonary Nodules Detected on CT Images: From the Fleischner Society 2017; Radiology 2017; 284:228-243. Electronically Signed   By: Ernie Avena M.D.   On: 09/02/2021 15:49   Result Date: 09/02/2021 CLINICAL DATA:  DVT lower extremity, positive D-dimer EXAM: CT ANGIOGRAPHY CHEST WITH CONTRAST TECHNIQUE: Multidetector CT imaging of the  chest was performed using the standard protocol during bolus administration of intravenous contrast. Multiplanar CT image reconstructions and MIPs were obtained to evaluate the vascular anatomy. RADIATION DOSE REDUCTION: This exam was performed according to the departmental dose-optimization program which includes automated exposure control, adjustment of the mA and/or kV according to patient size and/or use of iterative reconstruction technique. CONTRAST:  OMNIPAQUE IOHEXOL 350 MG/ML SOLN COMPARISON:  Previous studies including the chest radiograph done on 08/31/2021 FINDINGS: Cardiovascular: Heart is enlarged in size. The dilation of left ventricle. There is homogeneous enhancement in thoracic aorta. There are no intraluminal filling defects in central pulmonary artery branches. Evaluation of small peripheral branches in the lower lung fields is limited by infiltrates. Mediastinum/Nodes: Unremarkable. Lungs/Pleura: There are small linear patchy  densities in both lower lung fields suggesting subsegmental atelectasis/pneumonia. In image 91 of series 6, 6 mm nodule is seen in left lower lobe. Minimal bilateral pleural effusions are seen there is no pneumothorax. Upper Abdomen: There is fatty infiltration in liver. Musculoskeletal: No acute findings are seen. Review of the MIP images confirms the above findings. IMPRESSION: There is no evidence of central pulmonary artery embolism. Evaluation of small peripheral branches in the lower lung fields is limited by infiltrates. There is no evidence of thoracic aortic dissection. Small linear patchy densities in the lower lung fields suggest subsegmental atelectasis/pneumonia. Cardiomegaly.  Fatty liver. Electronically Signed: By: Ernie Avena M.D. On: 09/02/2021 15:02   US Venous Img Lower Bilateral (DVT)  Result Date: 09/02/2021 CLINICAL DATA:  Short of breath for 4 days, bilateral lower extremity edema EXAM: BILATERAL LOWER EXTREMITY VENOUS DOPPLER  ULTRASOUND TECHNIQUE: Gray-scale sonography with graded compression, as well as color Doppler and duplex ultrasound were performed to evaluate the lower extremity deep venous systems from the level of the common femoral vein and including the common femoral, femoral, profunda femoral, popliteal and calf veins including the posterior tibial, peroneal and gastrocnemius veins when visible. The superficial great saphenous vein was also interrogated. Spectral Doppler was utilized to evaluate flow at rest and with distal augmentation maneuvers in the common femoral, femoral and popliteal veins. COMPARISON:  None Available. FINDINGS: RIGHT LOWER EXTREMITY Common Femoral Vein: No evidence of thrombus. Normal compressibility, respiratory phasicity and response to augmentation. Saphenofemoral Junction: No evidence of thrombus. Normal compressibility and flow on color Doppler imaging. Profunda Femoral Vein: No evidence of thrombus. Normal compressibility and flow on color Doppler imaging. Femoral Vein: No evidence of thrombus. Normal compressibility, respiratory phasicity and response to augmentation. Popliteal Vein: No evidence of thrombus. Normal compressibility, respiratory phasicity and response to augmentation. Calf Veins: No gross evidence of thrombus. Evaluation limited due to body habitus and edema. Superficial Great Saphenous Vein: No evidence of thrombus. Normal compressibility. Other Findings:  None. LEFT LOWER EXTREMITY Common Femoral Vein: No evidence of thrombus. Normal compressibility, respiratory phasicity and response to augmentation. Saphenofemoral Junction: There is a small adherent thrombus at the saphenofemoral junction extending into the common femoral vein, which has an appearance most consistent with chronic DVT. No occlusive thrombus is identified. Profunda Femoral Vein: No evidence of thrombus. Normal compressibility and flow on color Doppler imaging. Femoral Vein: No evidence of thrombus. Normal  compressibility, respiratory phasicity and response to augmentation. Popliteal Vein: Occlusive thrombus within the left popliteal vein. The vessel is noncompressible, with no color flow identified. Calf Veins: Limited evaluation due to body habitus and subcutaneous edema. Superficial Great Saphenous Vein: No evidence of thrombus. Normal compressibility. Other Findings:  None. IMPRESSION: 1. Occlusive acute deep venous thrombosis of the left popliteal vein. 2. Suspected chronic nonocclusive thrombus within the left common femoral vein at the saphenofemoral junction. 3. No evidence of deep venous thrombosis within the right lower extremity. 4. Limited evaluation of the calf vessels due to body habitus and subcutaneous edema. These results will be called to the ordering clinician or representative by the Radiologist Assistant, and communication documented in the PACS or [employer]. Electronically Signed   By: Sharlet Salina M.D.   On: 09/02/2021 12:32   ECHOCARDIOGRAM COMPLETE  Result Date: 09/01/2021    ECHOCARDIOGRAM REPORT   Patient Name:   RAMIN ZOLL Date of Exam: 09/01/2021 Medical Rec #:  [id number]     Height:       71.0 in Accession #:  [id number]    Weight:       498.0 lb Date of Birth:  05-31-1970      BSA:          3.107 m Patient Age:    51 years      BP:           110/81 mmHg Patient Gender: M             HR:           85 bpm. [facility] Procedure: 2D Echo, Color Doppler, Cardiac Doppler and Intracardiac            Opacification Agent Indications:    I50.9* Heart failure (unspecified)  History:        Patient has prior history of Echocardiogram examinations, most                 recent 03/06/2020. Risk Factors:Hypertension, Dyslipidemia and                 Sleep Apnea.  Sonographer:    Irving Burton Senior RDCS Referring Phys: [phone] JACOB J STINSON  Sonographer Comments: Technically difficult study due to poor echo windows. Technically difficult study with very poor echo windows due to  morbid obesity. IMPRESSIONS  1. Very technically difficult study with limited valve evaluation, Definity contrast given.  2. Left ventricular ejection fraction, by estimation, is 20 to 25%. Left ventricular ejection fraction by 2D MOD biplane is 25.0 %. The left ventricle has severely decreased function. The left ventricle demonstrates global hypokinesis. The left ventricular internal cavity size was moderately dilated. Left ventricular diastolic parameters are consistent with Grade I diastolic dysfunction (impaired relaxation).  3. Right ventricular systolic function was not well visualized. The right ventricular size is not well visualized.  4. The mitral valve was not well visualized. No evidence of mitral valve regurgitation.  5. The aortic valve was not well visualized. Aortic valve regurgitation is not visualized.  6. The inferior vena cava is dilated in size with <50% respiratory variability, suggesting right atrial pressure of 15 mmHg. Comparison(s): Changes from prior study are noted. 03/06/2020: LVEF 30-40%. FINDINGS  Left Ventricle: Left ventricular ejection fraction, by estimation, is 20 to 25%. Left ventricular ejection fraction by 2D MOD biplane is 25.0 %. The left ventricle has severely decreased function. The left ventricle demonstrates global hypokinesis. Definity contrast agent was given IV to delineate the left ventricular endocardial borders. The left ventricular internal cavity size was moderately dilated. Suboptimal image quality limits for assessment of left ventricular hypertrophy. Left ventricular  diastolic parameters are consistent with Grade I diastolic dysfunction (impaired relaxation). Indeterminate filling pressures. Right Ventricle: The right ventricular size is not well visualized. Right vetricular wall thickness was not well visualized. Right ventricular systolic function was not well visualized. Left Atrium: Left atrial size was normal in size. Right Atrium: Right atrial size was  normal in size. Pericardium: There is no evidence of pericardial effusion. Mitral Valve: The mitral valve was not well visualized. No evidence of mitral valve regurgitation. Tricuspid Valve: The tricuspid valve is not well visualized. Tricuspid valve regurgitation is not demonstrated. Aortic Valve: The aortic valve was not well visualized. Aortic valve regurgitation is not visualized. Pulmonic Valve: The pulmonic valve was not well visualized. Pulmonic valve regurgitation is not visualized. Aorta: The aortic root and ascending aorta are structurally normal, with no evidence of dilitation. Venous: The inferior vena cava is dilated in size with less than 50% respiratory variability, suggesting  right atrial pressure of 15 mmHg. IAS/Shunts: No atrial level shunt detected by color flow Doppler.  LEFT VENTRICLE PLAX 2D                        Biplane EF (MOD) LVOT diam:     2.30 cm         LV Biplane EF:   Left LV SV:         72                               ventricular LV SV Index:   23                               ejection LVOT Area:     4.15 cm                         fraction by                                                 2D MOD                                                 biplane is LV Volumes (MOD)                                25.0 %. LV vol d, MOD    192.0 ml A2C:                           Diastology LV vol d, MOD    237.0 ml      LV e' medial:    5.11 cm/s A4C:                           LV E/e' medial:  12.7 LV vol s, MOD    142.0 ml      LV e' lateral:   5.11 cm/s A2C:                           LV E/e' lateral: 12.7 LV vol s, MOD    186.0 ml A4C: LV SV MOD A2C:   50.0 ml LV SV MOD A4C:   237.0 ml LV SV MOD BP:    56.7 ml RIGHT VENTRICLE RV S prime:     12.60 cm/s TAPSE (M-mode): 2.3 cm LEFT ATRIUM             Index        RIGHT ATRIUM           Index LA Vol (A2C):   85.8 ml 27.61 ml/m  RA Area:     22.80 cm LA Vol (A4C):   70.2 ml 22.59 ml/m  RA Volume:   73.80 ml  23.75 ml/m LA Biplane Vol: 80.7 ml  25.97 ml/m  AORTIC VALVE LVOT Vmax:   106.00 cm/s  LVOT Vmean:  66.200 cm/s LVOT VTI:    0.174 m  AORTA Ao Root diam: 3.40 cm MITRAL VALVE MV Area (PHT): 3.65 cm    SHUNTS MV Decel Time: 208 msec    Systemic VTI:  0.17 m MV E velocity: 65.10 cm/s  Systemic Diam: 2.30 cm MV A velocity: 84.40 cm/s MV E/A ratio:  0.77 Zoila Shutter MD Electronically signed by Zoila Shutter MD Signature Date/Time: 09/01/2021/12:30:05 PM    Final    DG Chest Portable 1 View  Result Date: 08/31/2021 CLINICAL DATA:  Shortness of breath EXAM: PORTABLE CHEST 1 VIEW COMPARISON:  11/12/2020 FINDINGS: Cardiomegaly and congestion of central vessels. There is no edema, consolidation, effusion, or pneumothorax. ACDF hardware IMPRESSION: Cardiomegaly and central vascular congestion. Electronically Signed   By: Tiburcio Pea M.D.   On: 08/31/2021 11:48    Erick Blinks, MD [facility]  If 7PM-7AM, please contact night-coverage [email or web address]  09/06/2021, 7:20 PM   LOS: 6 days

## 2021-09-06 NOTE — Progress Notes (Signed)
Telemetry called stating patient had 15 beat run of v-tach. MD Memon notified. Oncoming nurse Candace notified.

## 2021-09-06 NOTE — Plan of Care (Signed)
  Problem: Acute Rehab PT Goals(only PT should resolve) Goal: Pt Will Go Supine/Side To Sit Outcome: Progressing Flowsheets (Taken 09/06/2021 1555) Pt will go Supine/Side to Sit:  with min guard assist  with minimal assist Goal: Patient Will Transfer Sit To/From Stand Outcome: Progressing Flowsheets (Taken 09/06/2021 1555) Patient will transfer sit to/from stand:  with moderate assist  with minimal assist Goal: Pt Will Transfer Bed To Chair/Chair To Bed Outcome: Progressing Flowsheets (Taken 09/06/2021 1555) Pt will Transfer Bed to Chair/Chair to Bed:  with min assist  with mod assist   3:56 PM, 09/06/21 Ocie Bob, MPT Physical Therapist with [facility] [phone] office [phone] mobile phone

## 2021-09-07 DIAGNOSIS — J9601 Acute respiratory failure with hypoxia: Secondary | ICD-10-CM | POA: Diagnosis not present

## 2021-09-07 DIAGNOSIS — I5043 Acute on chronic combined systolic (congestive) and diastolic (congestive) heart failure: Secondary | ICD-10-CM | POA: Diagnosis not present

## 2021-09-07 LAB — BASIC METABOLIC PANEL
Anion gap: 7 (ref 5–15)
BUN: 20 mg/dL (ref 6–20)
CO2: 31 mmol/L (ref 22–32)
Calcium: 8.9 mg/dL (ref 8.9–10.3)
Chloride: 100 mmol/L (ref 98–111)
Creatinine, Ser: 0.98 mg/dL (ref 0.61–1.24)
GFR, Estimated: >60 mL/min (ref >60)
Glucose, Bld: 102 mg/dL — ABNORMAL HIGH (ref 70–99)
Potassium: 4.4 mmol/L (ref 3.5–5.1)
Sodium: 138 mmol/L (ref 135–145)

## 2021-09-07 NOTE — Progress Notes (Signed)
PROGRESS NOTE  Jordan Jensen [id number] DOB: [birth year] DOA: 08/31/2021 PCP: Renee Rival, FNP  Brief History:  Wt Readings from Last 3 Encounters:  08/01/21 (!) 480 lb (217.7 kg)  07/30/21 (!) 450 lb (204.1 kg)  06/04/21 (!) 460 lb (22.73 kg)  51 year old male with a history of traumatic myelopathy C6/7, morbid obesity, asthma, lymphedema, OSA, systolic CHF, hypertension, hyperlipidemia presenting with 3-day history of shortness of breath.  The patient's wife at the bedside who supplements the history.  She states that she has noted increasing peripheral edema and increasing abdominal girth at least for the past week.  The patient states that he has actually had worsening shortness of breath and orthopnea for the better part of at least a week.  He denies any fevers, chills, chest pain, headache, nausea, vomiting, diarrhea, abdominal pain. Patient states that he has been poorly compliant with CPAP at home.  In addition, he states that he continues to be only taking his Entresto once daily rather than twice daily.  He states that he has been taking furosemide 40 mg on a daily basis although he does withhold taking it if he has a physician appointment or has somewhere to go. In the ED, the patient will had a low-grade temperature of 99.4 F.  He was hemodynamically stable with oxygen saturation 95 to 100% on room air.  WBC 4.4, hemoglobin 14.0, platelets 230,000.  Sodium 137, potassium 3.9, bicarbonate 38, serum creatinine 0.83.  BNP was 153.0.  Chest x-ray showed increased vascular congestion.  The patient was started on IV furosemide and admitted for further evaluation and treatment.    Assessment and Plan: * Acute on chronic combined systolic and diastolic CHF (congestive heart failure) (HCC) Spouse states that the patient weighed about 435 pounds about 9 months ago. Patient appears at least 20 pounds overweight Difficult to assess fluid status given the patient's body  habitus Obtain ReDs reading--unable due to weight limitations Continues to have good urine output Continue IV Lasix 80 mg IV twice daily Restart toprol Continue Entresto>>stopped due to low BP   Acute deep vein thrombosis (DVT) of popliteal vein of left lower extremity (HCC) Started apixaban 8/28 CTA chest--no PE  Elevated troponin Due demand ischemia is setting of decompensated CHF -no chest pain reported - Prior Myoview Lexiscan with no ischemia  Perirectal fistula Outpatient follow-up with general surgery Gluteal/perianal area not grossly infected on exam  Hyperlipidemia Continue statin  OSA (obstructive sleep apnea) Poor compliance at home with CPAP I have asked the patient to bring in his home CPAP machine ---he is able to wear it 3-4 hours  Morbid obesity with BMI of 60.0-69.9, adult (HCC) Lifestyle modification BMI 57.46  Pulmonary nodule, left Outpatient surveillance   Family Communication:   spouse updated 9/2   Consultants:  cardiology   Code Status:  FULL    DVT Prophylaxis:  apixaban     Procedures: As Listed in Progress Note Above   Antibiotics: None        Subjective:  Continues to have good urine output.  Feels that shortness of breath is improving   Objective: Vitals:   09/07/21 0800 09/07/21 1327 09/07/21 1455 09/07/21 1612  BP: 106/84 118/67 (!) 97/49 124/70  Pulse: 77 84 81   Resp:  20 20   Temp:  98.8 F (37.1 C) 97.7 F (36.5 C)   TempSrc:  Oral    SpO2:  99% 91%  Weight:      Height:        Intake/Output Summary (Last 24 hours) at 09/07/2021 1906 Last data filed at 09/07/2021 1851 Gross per 24 hour  Intake 960 ml  Output 2625 ml  Net -1665 ml   Weight change: -2.722 kg Exam:  General:  Pt is alert, follows commands appropriately, not in acute distress HEENT: No icterus, No thrush, No neck mass, [place]/AT Cardiovascular: RRR, S1/S2, no rubs, no gallops Respiratory: bibasilar rales. No wheeze Abdomen: Soft/+BS,  non tender, non distended, no guarding Extremities: 2 + LE edema, No lymphangitis, No petechiae, No rashes, no synovitis   Data Reviewed: I have personally reviewed following labs and imaging studies Basic Metabolic Panel: Recent Labs  Lab 09/03/21 0348 09/04/21 0254 09/05/21 0418 09/06/21 0319 09/06/21 0800 09/07/21 0444  NA 137 136 138 139  --  138  K 4.1 4.0 4.1 3.8  --  4.4  CL 97* 96* 99 99  --  100  CO2 33* 30 32 32  --  31  GLUCOSE 98 118* 101* 113*  --  102*  BUN 12 14 17 18   --  20  CREATININE 0.93 1.01 1.06 0.99  --  0.98  CALCIUM 8.7* 8.6* 8.8* 9.0  --  8.9  MG  --  2.1  --   --  2.2  --    Liver Function Tests: No results for input(s): "AST", "ALT", "ALKPHOS", "BILITOT", "PROT", "ALBUMIN" in the last 168 hours. No results for input(s): "LIPASE", "AMYLASE" in the last 168 hours. No results for input(s): "AMMONIA" in the last 168 hours. Coagulation Profile: No results for input(s): "INR", "PROTIME" in the last 168 hours. CBC: No results for input(s): "WBC", "NEUTROABS", "HGB", "HCT", "MCV", "PLT" in the last 168 hours.  Cardiac Enzymes: No results for input(s): "CKTOTAL", "CKMB", "CKMBINDEX", "TROPONINI" in the last 168 hours. BNP: Invalid input(s): "POCBNP" CBG: No results for input(s): "GLUCAP" in the last 168 hours. HbA1C: No results for input(s): "HGBA1C" in the last 72 hours. Urine analysis:    Component Value Date/Time   COLORURINE YELLOW 06/04/2021 [place] 06/04/2021 1324   LABSPEC 1.016 06/04/2021 1324   PHURINE 7.0 06/04/2021 1324   GLUCOSEU NEGATIVE 06/04/2021 1324   HGBUR NEGATIVE 06/04/2021 1324   BILIRUBINUR NEGATIVE 06/04/2021 1324   BILIRUBINUR negative 08/26/2020 1558   BILIRUBINUR NEG 06/13/2015 1650   KETONESUR NEGATIVE 06/04/2021 1324   PROTEINUR NEGATIVE 06/04/2021 1324   UROBILINOGEN 0.2 08/26/2020 1558   UROBILINOGEN 1.0 09/26/2018 1619   NITRITE NEGATIVE 06/04/2021 1324   LEUKOCYTESUR NEGATIVE 06/04/2021  1324   Sepsis Labs: @LABRCNTIP (procalcitonin:4,lacticidven:4) ) Recent Results (from the past 240 hour(s))  Resp Panel by RT-PCR (Flu A&B, Covid) Anterior Nasal Swab     Status: None   Collection Time: 08/31/21 12:10 PM   Specimen: Anterior Nasal Swab  Result Value Ref Range Status   SARS Coronavirus 2 by RT PCR NEGATIVE NEGATIVE Final    Comment: (NOTE) SARS-CoV-2 target nucleic acids are NOT DETECTED.  The SARS-CoV-2 RNA is generally detectable in upper respiratory specimens during the acute phase of infection. The lowest concentration of SARS-CoV-2 viral copies this assay can detect is 138 copies/mL. A negative result does not preclude SARS-Cov-2 infection and should not be used as the sole basis for treatment or other patient management decisions. A negative result may occur with  improper specimen collection/handling, submission of specimen other than nasopharyngeal swab, presence of viral mutation(s) within the areas targeted by this  assay, and inadequate number of viral copies(<138 copies/mL). A negative result must be combined with clinical observations, patient history, and epidemiological information. The expected result is Negative.  Fact Sheet for Patients:  BloggerCourse.com  Fact Sheet for Healthcare Providers:  SeriousBroker.it  This test is no t yet approved or cleared by the [place] FDA and  has been authorized for detection and/or diagnosis of SARS-CoV-2 by FDA under an Emergency Use Authorization (EUA). This EUA will remain  in effect (meaning this test can be used) for the duration of the COVID-19 declaration under Section 564(b)(1) of the Act, 21 U.S.C.section 360bbb-3(b)(1), unless the authorization is terminated  or revoked sooner.       Influenza A by PCR NEGATIVE NEGATIVE Final   Influenza B by PCR NEGATIVE NEGATIVE Final    Comment: (NOTE) The Xpert Xpress SARS-CoV-2/FLU/RSV plus assay is  intended as an aid in the diagnosis of influenza from Nasopharyngeal swab specimens and should not be used as a sole basis for treatment. Nasal washings and aspirates are unacceptable for Xpert Xpress SARS-CoV-2/FLU/RSV testing.  Fact Sheet for Patients: BloggerCourse.com  Fact Sheet for Healthcare Providers: SeriousBroker.it  This test is not yet approved or cleared by the [place] FDA and has been authorized for detection and/or diagnosis of SARS-CoV-2 by FDA under an Emergency Use Authorization (EUA). This EUA will remain in effect (meaning this test can be used) for the duration of the COVID-19 declaration under Section 564(b)(1) of the Act, 21 U.S.C. section 360bbb-3(b)(1), unless the authorization is terminated or revoked.  Performed at [facility], [address], [place], [place] [address]      Scheduled Meds:  apixaban  10 mg Oral BID   Followed by   Melene Muller ON 09/09/2021] apixaban  5 mg Oral BID   fluticasone furoate-vilanterol  1 puff Inhalation Daily   furosemide  80 mg Intravenous BID   metoprolol succinate  25 mg Oral Daily   potassium chloride  40 mEq Oral BID   rosuvastatin  20 mg Oral Daily   Continuous Infusions:  Procedures/Studies: CT Angio Chest Pulmonary Embolism (PE) W or WO Contrast  Addendum Date: 09/02/2021   ADDENDUM REPORT: 09/02/2021 15:49 ADDENDUM: Following should be added to the impression. There is a 5 mm nodule in left lower lung field. No follow-up needed if patient is low-risk.This recommendation follows the consensus statement: Guidelines for Management of Incidental Pulmonary Nodules Detected on CT Images: From the Fleischner Society 2017; Radiology 2017; 284:228-243. Electronically Signed   By: Ernie Avena M.D.   On: 09/02/2021 15:49   Result Date: 09/02/2021 CLINICAL DATA:  DVT lower extremity, positive D-dimer EXAM: CT ANGIOGRAPHY CHEST WITH CONTRAST TECHNIQUE: Multidetector CT  imaging of the chest was performed using the standard protocol during bolus administration of intravenous contrast. Multiplanar CT image reconstructions and MIPs were obtained to evaluate the vascular anatomy. RADIATION DOSE REDUCTION: This exam was performed according to the departmental dose-optimization program which includes automated exposure control, adjustment of the mA and/or kV according to patient size and/or use of iterative reconstruction technique. CONTRAST:  OMNIPAQUE IOHEXOL 350 MG/ML SOLN COMPARISON:  Previous studies including the chest radiograph done on 08/31/2021 FINDINGS: Cardiovascular: Heart is enlarged in size. The dilation of left ventricle. There is homogeneous enhancement in thoracic aorta. There are no intraluminal filling defects in central pulmonary artery branches. Evaluation of small peripheral branches in the lower lung fields is limited by infiltrates. Mediastinum/Nodes: Unremarkable. Lungs/Pleura: There are small linear patchy densities in both lower  lung fields suggesting subsegmental atelectasis/pneumonia. In image 91 of series 6, 6 mm nodule is seen in left lower lobe. Minimal bilateral pleural effusions are seen there is no pneumothorax. Upper Abdomen: There is fatty infiltration in liver. Musculoskeletal: No acute findings are seen. Review of the MIP images confirms the above findings. IMPRESSION: There is no evidence of central pulmonary artery embolism. Evaluation of small peripheral branches in the lower lung fields is limited by infiltrates. There is no evidence of thoracic aortic dissection. Small linear patchy densities in the lower lung fields suggest subsegmental atelectasis/pneumonia. Cardiomegaly.  Fatty liver. Electronically Signed: By: Elmer Picker M.D. On: 09/02/2021 15:02   US Venous Img Lower Bilateral (DVT)  Result Date: 09/02/2021 CLINICAL DATA:  Short of breath for 4 days, bilateral lower extremity edema EXAM: BILATERAL LOWER EXTREMITY VENOUS  DOPPLER ULTRASOUND TECHNIQUE: Gray-scale sonography with graded compression, as well as color Doppler and duplex ultrasound were performed to evaluate the lower extremity deep venous systems from the level of the common femoral vein and including the common femoral, femoral, profunda femoral, popliteal and calf veins including the posterior tibial, peroneal and gastrocnemius veins when visible. The superficial great saphenous vein was also interrogated. Spectral Doppler was utilized to evaluate flow at rest and with distal augmentation maneuvers in the common femoral, femoral and popliteal veins. COMPARISON:  None Available. FINDINGS: RIGHT LOWER EXTREMITY Common Femoral Vein: No evidence of thrombus. Normal compressibility, respiratory phasicity and response to augmentation. Saphenofemoral Junction: No evidence of thrombus. Normal compressibility and flow on color Doppler imaging. Profunda Femoral Vein: No evidence of thrombus. Normal compressibility and flow on color Doppler imaging. Femoral Vein: No evidence of thrombus. Normal compressibility, respiratory phasicity and response to augmentation. Popliteal Vein: No evidence of thrombus. Normal compressibility, respiratory phasicity and response to augmentation. Calf Veins: No gross evidence of thrombus. Evaluation limited due to body habitus and edema. Superficial Great Saphenous Vein: No evidence of thrombus. Normal compressibility. Other Findings:  None. LEFT LOWER EXTREMITY Common Femoral Vein: No evidence of thrombus. Normal compressibility, respiratory phasicity and response to augmentation. Saphenofemoral Junction: There is a small adherent thrombus at the saphenofemoral junction extending into the common femoral vein, which has an appearance most consistent with chronic DVT. No occlusive thrombus is identified. Profunda Femoral Vein: No evidence of thrombus. Normal compressibility and flow on color Doppler imaging. Femoral Vein: No evidence of thrombus.  Normal compressibility, respiratory phasicity and response to augmentation. Popliteal Vein: Occlusive thrombus within the left popliteal vein. The vessel is noncompressible, with no color flow identified. Calf Veins: Limited evaluation due to body habitus and subcutaneous edema. Superficial Great Saphenous Vein: No evidence of thrombus. Normal compressibility. Other Findings:  None. IMPRESSION: 1. Occlusive acute deep venous thrombosis of the left popliteal vein. 2. Suspected chronic nonocclusive thrombus within the left common femoral vein at the saphenofemoral junction. 3. No evidence of deep venous thrombosis within the right lower extremity. 4. Limited evaluation of the calf vessels due to body habitus and subcutaneous edema. These results will be called to the ordering clinician or representative by the Radiologist Assistant, and communication documented in the PACS or [employer]. Electronically Signed   By: Randa Ngo M.D.   On: 09/02/2021 12:32   ECHOCARDIOGRAM COMPLETE  Result Date: 09/01/2021    ECHOCARDIOGRAM REPORT   Patient Name:   Jordan Jensen Date of Exam: 09/01/2021 Medical Rec #:  [id number]     Height:       71.0 in Accession #:  [id number]    Weight:       498.0 lb Date of Birth:  1970-07-04      BSA:          3.107 m Patient Age:    73 years      BP:           110/81 mmHg Patient Gender: M             HR:           85 bpm. Exam Location:  [facility] Procedure: 2D Echo, Color Doppler, Cardiac Doppler and Intracardiac            Opacification Agent Indications:    I50.9* Heart failure (unspecified)  History:        Patient has prior history of Echocardiogram examinations, most                 recent 03/06/2020. Risk Factors:Hypertension, Dyslipidemia and                 Sleep Apnea.  Sonographer:    Raquel Sarna Senior RDCS Referring Phys: [phone] JACOB J STINSON  Sonographer Comments: Technically difficult study due to poor echo windows. Technically difficult study with very poor echo windows due  to morbid obesity. IMPRESSIONS  1. Very technically difficult study with limited valve evaluation, Definity contrast given.  2. Left ventricular ejection fraction, by estimation, is 20 to 25%. Left ventricular ejection fraction by 2D MOD biplane is 25.0 %. The left ventricle has severely decreased function. The left ventricle demonstrates global hypokinesis. The left ventricular internal cavity size was moderately dilated. Left ventricular diastolic parameters are consistent with Grade I diastolic dysfunction (impaired relaxation).  3. Right ventricular systolic function was not well visualized. The right ventricular size is not well visualized.  4. The mitral valve was not well visualized. No evidence of mitral valve regurgitation.  5. The aortic valve was not well visualized. Aortic valve regurgitation is not visualized.  6. The inferior vena cava is dilated in size with <50% respiratory variability, suggesting right atrial pressure of 15 mmHg. Comparison(s): Changes from prior study are noted. 03/06/2020: LVEF 30-40%. FINDINGS  Left Ventricle: Left ventricular ejection fraction, by estimation, is 20 to 25%. Left ventricular ejection fraction by 2D MOD biplane is 25.0 %. The left ventricle has severely decreased function. The left ventricle demonstrates global hypokinesis. Definity contrast agent was given IV to delineate the left ventricular endocardial borders. The left ventricular internal cavity size was moderately dilated. Suboptimal image quality limits for assessment of left ventricular hypertrophy. Left ventricular  diastolic parameters are consistent with Grade I diastolic dysfunction (impaired relaxation). Indeterminate filling pressures. Right Ventricle: The right ventricular size is not well visualized. Right vetricular wall thickness was not well visualized. Right ventricular systolic function was not well visualized. Left Atrium: Left atrial size was normal in size. Right Atrium: Right atrial size was  normal in size. Pericardium: There is no evidence of pericardial effusion. Mitral Valve: The mitral valve was not well visualized. No evidence of mitral valve regurgitation. Tricuspid Valve: The tricuspid valve is not well visualized. Tricuspid valve regurgitation is not demonstrated. Aortic Valve: The aortic valve was not well visualized. Aortic valve regurgitation is not visualized. Pulmonic Valve: The pulmonic valve was not well visualized. Pulmonic valve regurgitation is not visualized. Aorta: The aortic root and ascending aorta are structurally normal, with no evidence of dilitation. Venous: The inferior vena cava is dilated in size with less than 50% respiratory variability, suggesting  right atrial pressure of 15 mmHg. IAS/Shunts: No atrial level shunt detected by color flow Doppler.  LEFT VENTRICLE PLAX 2D                        Biplane EF (MOD) LVOT diam:     2.30 cm         LV Biplane EF:   Left LV SV:         72                               ventricular LV SV Index:   23                               ejection LVOT Area:     4.15 cm                         fraction by                                                 2D MOD                                                 biplane is LV Volumes (MOD)                                25.0 %. LV vol d, MOD    192.0 ml A2C:                           Diastology LV vol d, MOD    237.0 ml      LV e' medial:    5.11 cm/s A4C:                           LV E/e' medial:  12.7 LV vol s, MOD    142.0 ml      LV e' lateral:   5.11 cm/s A2C:                           LV E/e' lateral: 12.7 LV vol s, MOD    186.0 ml A4C: LV SV MOD A2C:   50.0 ml LV SV MOD A4C:   237.0 ml LV SV MOD BP:    56.7 ml RIGHT VENTRICLE RV S prime:     12.60 cm/s TAPSE (M-mode): 2.3 cm LEFT ATRIUM             Index        RIGHT ATRIUM           Index LA Vol (A2C):   85.8 ml 27.61 ml/m  RA Area:     22.80 cm LA Vol (A4C):   70.2 ml 22.59 ml/m  RA Volume:   73.80 ml  23.75 ml/m LA Biplane Vol: 80.7 ml  25.97 ml/m  AORTIC VALVE LVOT Vmax:   106.00 cm/s  LVOT Vmean:  66.200 cm/s LVOT VTI:    0.174 m  AORTA Ao Root diam: 3.40 cm MITRAL VALVE MV Area (PHT): 3.65 cm    SHUNTS MV Decel Time: 208 msec    Systemic VTI:  0.17 m MV E velocity: 65.10 cm/s  Systemic Diam: 2.30 cm MV A velocity: 84.40 cm/s MV E/A ratio:  0.77 Lyman Bishop MD Electronically signed by Lyman Bishop MD Signature Date/Time: 09/01/2021/12:30:05 PM    Final    DG Chest Portable 1 View  Result Date: 08/31/2021 CLINICAL DATA:  Shortness of breath EXAM: PORTABLE CHEST 1 VIEW COMPARISON:  11/12/2020 FINDINGS: Cardiomegaly and congestion of central vessels. There is no edema, consolidation, effusion, or pneumothorax. ACDF hardware IMPRESSION: Cardiomegaly and central vascular congestion. Electronically Signed   By: Jorje Guild M.D.   On: 08/31/2021 11:48    Kathie Dike, MD [facility]  If 7PM-7AM, please contact night-coverage [email or web address]  09/07/2021, 7:06 PM   LOS: 7 days

## 2021-09-08 DIAGNOSIS — J9601 Acute respiratory failure with hypoxia: Secondary | ICD-10-CM | POA: Diagnosis not present

## 2021-09-08 DIAGNOSIS — I5043 Acute on chronic combined systolic (congestive) and diastolic (congestive) heart failure: Secondary | ICD-10-CM | POA: Diagnosis not present

## 2021-09-08 LAB — BASIC METABOLIC PANEL
Anion gap: 7 (ref 5–15)
BUN: 19 mg/dL (ref 6–20)
CO2: 30 mmol/L (ref 22–32)
Calcium: 8.8 mg/dL — ABNORMAL LOW (ref 8.9–10.3)
Chloride: 98 mmol/L (ref 98–111)
Creatinine, Ser: 0.99 mg/dL (ref 0.61–1.24)
GFR, Estimated: >60 mL/min (ref >60)
Glucose, Bld: 104 mg/dL — ABNORMAL HIGH (ref 70–99)
Potassium: 4.5 mmol/L (ref 3.5–5.1)
Sodium: 135 mmol/L (ref 135–145)
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
## 2021-09-08 NOTE — Progress Notes (Signed)
PROGRESS NOTE  Jordan Jensen ZOX:[id number] DOB: 16-Jul-1970 DOA: 08/31/2021 PCP: Donell Beers, FNP  Brief History:  Wt Readings from Last 3 Encounters:  08/01/21 (!) 480 lb (217.7 kg)  07/30/21 (!) 450 lb (204.1 kg)  06/04/21 (!) 460 lb (93.54 kg)  51 year old male with a history of traumatic myelopathy C6/7, morbid obesity, asthma, lymphedema, OSA, systolic CHF, hypertension, hyperlipidemia presenting with 3-day history of shortness of breath.  The patient's wife at the bedside who supplements the history.  She states that she has noted increasing peripheral edema and increasing abdominal girth at least for the past week.  The patient states that he has actually had worsening shortness of breath and orthopnea for the better part of at least a week.  He denies any fevers, chills, chest pain, headache, nausea, vomiting, diarrhea, abdominal pain. Patient states that he has been poorly compliant with CPAP at home.  In addition, he states that he continues to be only taking his Entresto once daily rather than twice daily.  He states that he has been taking furosemide 40 mg on a daily basis although he does withhold taking it if he has a physician appointment or has somewhere to go. In the ED, the patient will had a low-grade temperature of 99.4 F.  He was hemodynamically stable with oxygen saturation 95 to 100% on room air.  WBC 4.4, hemoglobin 14.0, platelets 230,000.  Sodium 137, potassium 3.9, bicarbonate 38, serum creatinine 0.83.  BNP was 153.0.  Chest x-ray showed increased vascular congestion.  The patient was started on IV furosemide and admitted for further evaluation and treatment.    Assessment and Plan: * Acute on chronic combined systolic and diastolic CHF (congestive heart failure) (HCC) Spouse states that the patient weighed about 435 pounds about 9 months ago. Patient appears at least 20 pounds overweight Difficult to assess fluid status given the patient's body  habitus Obtain ReDs reading--unable due to weight limitations Continues to have good urine output with stable renal function Continue IV Lasix 80 mg IV twice daily Restart toprol Entresto stopped due to low BP Weight continues to trend down with diuresis   Acute deep vein thrombosis (DVT) of popliteal vein of left lower extremity (HCC) Started apixaban 8/28 CTA chest--no PE  Elevated troponin Due demand ischemia is setting of decompensated CHF -no chest pain reported - Prior Myoview Lexiscan with no ischemia  Perirectal fistula Outpatient follow-up with general surgery Gluteal/perianal area not grossly infected on exam  Hyperlipidemia Continue statin  OSA (obstructive sleep apnea) Poor compliance at home with CPAP I have asked the patient to bring in his home CPAP machine ---he is able to wear it 3-4 hours  Morbid obesity with BMI of 60.0-69.9, adult (HCC) Lifestyle modification BMI 57.46  Pulmonary nodule, left Outpatient surveillance   Family Communication:   spouse updated 9/3   Consultants:  cardiology   Code Status:  FULL    DVT Prophylaxis:  apixaban     Procedures: As Listed in Progress Note Above   Antibiotics: None        Subjective:  Reports continued good urine output after receiving Lasix.  Feels that edema is continues to improve   Objective: Vitals:   09/07/21 2048 09/08/21 0500 09/08/21 0758 09/08/21 0840  BP: (!) 95/57 (!) 90/59  (!) 99/51  Pulse: 73 74  79  Resp: 16 18  18   Temp: 98.1 F (36.7 C) 98 F (36.7 C)  98.1 F (36.7 C)  TempSrc: Oral Oral  Oral  SpO2: 93% 93% 93% 95%  Weight:  (!) 184.6 kg    Height:        Intake/Output Summary (Last 24 hours) at 09/08/2021 1837 Last data filed at 09/08/2021 1810 Gross per 24 hour  Intake 1200 ml  Output 2550 ml  Net -1350 ml   Weight change: -0.454 kg Exam:  General:  Pt is alert, follows commands appropriately, not in acute distress HEENT: No icterus, No thrush, No  neck mass, [place]/AT Cardiovascular: RRR, S1/S2, no rubs, no gallops Respiratory: bibasilar rales. No wheeze Abdomen: Soft/+BS, non tender, non distended, no guarding Extremities: 2 + LE edema, No lymphangitis, No petechiae, No rashes, no synovitis   Data Reviewed: I have personally reviewed following labs and imaging studies Basic Metabolic Panel: Recent Labs  Lab 09/04/21 0254 09/05/21 0418 09/06/21 0319 09/06/21 0800 09/07/21 0444 09/08/21 0426  NA 136 138 139  --  138 135  K 4.0 4.1 3.8  --  4.4 4.5  CL 96* 99 99  --  100 98  CO2 30 32 32  --  31 30  GLUCOSE 118* 101* 113*  --  102* 104*  BUN 14 17 18   --  20 19  CREATININE 1.01 1.06 0.99  --  0.98 0.99  CALCIUM 8.6* 8.8* 9.0  --  8.9 8.8*  MG 2.1  --   --  2.2  --   --    Liver Function Tests: No results for input(s): "AST", "ALT", "ALKPHOS", "BILITOT", "PROT", "ALBUMIN" in the last 168 hours. No results for input(s): "LIPASE", "AMYLASE" in the last 168 hours. No results for input(s): "AMMONIA" in the last 168 hours. Coagulation Profile: No results for input(s): "INR", "PROTIME" in the last 168 hours. CBC: No results for input(s): "WBC", "NEUTROABS", "HGB", "HCT", "MCV", "PLT" in the last 168 hours.  Cardiac Enzymes: No results for input(s): "CKTOTAL", "CKMB", "CKMBINDEX", "TROPONINI" in the last 168 hours. BNP: Invalid input(s): "POCBNP" CBG: No results for input(s): "GLUCAP" in the last 168 hours. HbA1C: No results for input(s): "HGBA1C" in the last 72 hours. Urine analysis:    Component Value Date/Time   COLORURINE YELLOW 06/04/2021 1324   APPEARANCEUR CLEAR 06/04/2021 1324   LABSPEC 1.016 06/04/2021 1324   PHURINE 7.0 06/04/2021 1324   GLUCOSEU NEGATIVE 06/04/2021 1324   HGBUR NEGATIVE 06/04/2021 1324   BILIRUBINUR NEGATIVE 06/04/2021 1324   BILIRUBINUR negative 08/26/2020 1558   BILIRUBINUR NEG 06/13/2015 1650   KETONESUR NEGATIVE 06/04/2021 1324   PROTEINUR NEGATIVE 06/04/2021 1324   UROBILINOGEN 0.2  08/26/2020 1558   UROBILINOGEN 1.0 09/26/2018 1619   NITRITE NEGATIVE 06/04/2021 1324   LEUKOCYTESUR NEGATIVE 06/04/2021 1324   Sepsis Labs: @LABRCNTIP (procalcitonin:4,lacticidven:4) ) Recent Results (from the past 240 hour(s))  Resp Panel by RT-PCR (Flu A&B, Covid) Anterior Nasal Swab     Status: None   Collection Time: 08/31/21 12:10 PM   Specimen: Anterior Nasal Swab  Result Value Ref Range Status   SARS Coronavirus 2 by RT PCR NEGATIVE NEGATIVE Final    Comment: (NOTE) SARS-CoV-2 target nucleic acids are NOT DETECTED.  The SARS-CoV-2 RNA is generally detectable in upper respiratory specimens during the acute phase of infection. The lowest concentration of SARS-CoV-2 viral copies this assay can detect is 138 copies/mL. A negative result does not preclude SARS-Cov-2 infection and should not be used as the sole basis for treatment or other patient management decisions. A negative result may occur with  improper  specimen collection/handling, submission of specimen other than nasopharyngeal swab, presence of viral mutation(s) within the areas targeted by this assay, and inadequate number of viral copies(<138 copies/mL). A negative result must be combined with clinical observations, patient history, and epidemiological information. The expected result is Negative.  Fact Sheet for Patients:  BloggerCourse.com  Fact Sheet for Healthcare Providers:  SeriousBroker.it  This test is no t yet approved or cleared by the [place] FDA and  has been authorized for detection and/or diagnosis of SARS-CoV-2 by FDA under an Emergency Use Authorization (EUA). This EUA will remain  in effect (meaning this test can be used) for the duration of the COVID-19 declaration under Section 564(b)(1) of the Act, 21 U.S.C.section 360bbb-3(b)(1), unless the authorization is terminated  or revoked sooner.       Influenza A by PCR NEGATIVE NEGATIVE  Final   Influenza B by PCR NEGATIVE NEGATIVE Final    Comment: (NOTE) The Xpert Xpress SARS-CoV-2/FLU/RSV plus assay is intended as an aid in the diagnosis of influenza from Nasopharyngeal swab specimens and should not be used as a sole basis for treatment. Nasal washings and aspirates are unacceptable for Xpert Xpress SARS-CoV-2/FLU/RSV testing.  Fact Sheet for Patients: BloggerCourse.com  Fact Sheet for Healthcare Providers: SeriousBroker.it  This test is not yet approved or cleared by the [place] FDA and has been authorized for detection and/or diagnosis of SARS-CoV-2 by FDA under an Emergency Use Authorization (EUA). This EUA will remain in effect (meaning this test can be used) for the duration of the COVID-19 declaration under Section 564(b)(1) of the Act, 21 U.S.C. section 360bbb-3(b)(1), unless the authorization is terminated or revoked.  Performed at [facility], [address] [place], [place] [address]      Scheduled Meds:  apixaban  10 mg Oral BID   Followed by   Melene Muller ON 09/09/2021] apixaban  5 mg Oral BID   fluticasone furoate-vilanterol  1 puff Inhalation Daily   furosemide  80 mg Intravenous BID   metoprolol succinate  25 mg Oral Daily   potassium chloride  40 mEq Oral BID   rosuvastatin  20 mg Oral Daily   Continuous Infusions:  Procedures/Studies: CT Angio Chest Pulmonary Embolism (PE) W or WO Contrast  Addendum Date: 09/02/2021   ADDENDUM REPORT: 09/02/2021 15:49 ADDENDUM: Following should be added to the impression. There is a 5 mm nodule in left lower lung field. No follow-up needed if patient is low-risk.This recommendation follows the consensus statement: Guidelines for Management of Incidental Pulmonary Nodules Detected on CT Images: From the Fleischner Society 2017; Radiology 2017; 284:228-243. Electronically Signed   By: Ernie Avena M.D.   On: 09/02/2021 15:49   Result Date:  09/02/2021 CLINICAL DATA:  DVT lower extremity, positive D-dimer EXAM: CT ANGIOGRAPHY CHEST WITH CONTRAST TECHNIQUE: Multidetector CT imaging of the chest was performed using the standard protocol during bolus administration of intravenous contrast. Multiplanar CT image reconstructions and MIPs were obtained to evaluate the vascular anatomy. RADIATION DOSE REDUCTION: This exam was performed according to the departmental dose-optimization program which includes automated exposure control, adjustment of the mA and/or kV according to patient size and/or use of iterative reconstruction technique. CONTRAST:  OMNIPAQUE IOHEXOL 350 MG/ML SOLN COMPARISON:  Previous studies including the chest radiograph done on 08/31/2021 FINDINGS: Cardiovascular: Heart is enlarged in size. The dilation of left ventricle. There is homogeneous enhancement in thoracic aorta. There are no intraluminal filling defects in central pulmonary artery branches. Evaluation of small peripheral branches in the  lower lung fields is limited by infiltrates. Mediastinum/Nodes: Unremarkable. Lungs/Pleura: There are small linear patchy densities in both lower lung fields suggesting subsegmental atelectasis/pneumonia. In image 91 of series 6, 6 mm nodule is seen in left lower lobe. Minimal bilateral pleural effusions are seen there is no pneumothorax. Upper Abdomen: There is fatty infiltration in liver. Musculoskeletal: No acute findings are seen. Review of the MIP images confirms the above findings. IMPRESSION: There is no evidence of central pulmonary artery embolism. Evaluation of small peripheral branches in the lower lung fields is limited by infiltrates. There is no evidence of thoracic aortic dissection. Small linear patchy densities in the lower lung fields suggest subsegmental atelectasis/pneumonia. Cardiomegaly.  Fatty liver. Electronically Signed: By: Ernie Avena M.D. On: 09/02/2021 15:02   US Venous Img Lower Bilateral  (DVT)  Result Date: 09/02/2021 CLINICAL DATA:  Short of breath for 4 days, bilateral lower extremity edema EXAM: BILATERAL LOWER EXTREMITY VENOUS DOPPLER ULTRASOUND TECHNIQUE: Gray-scale sonography with graded compression, as well as color Doppler and duplex ultrasound were performed to evaluate the lower extremity deep venous systems from the level of the common femoral vein and including the common femoral, femoral, profunda femoral, popliteal and calf veins including the posterior tibial, peroneal and gastrocnemius veins when visible. The superficial great saphenous vein was also interrogated. Spectral Doppler was utilized to evaluate flow at rest and with distal augmentation maneuvers in the common femoral, femoral and popliteal veins. COMPARISON:  None Available. FINDINGS: RIGHT LOWER EXTREMITY Common Femoral Vein: No evidence of thrombus. Normal compressibility, respiratory phasicity and response to augmentation. Saphenofemoral Junction: No evidence of thrombus. Normal compressibility and flow on color Doppler imaging. Profunda Femoral Vein: No evidence of thrombus. Normal compressibility and flow on color Doppler imaging. Femoral Vein: No evidence of thrombus. Normal compressibility, respiratory phasicity and response to augmentation. Popliteal Vein: No evidence of thrombus. Normal compressibility, respiratory phasicity and response to augmentation. Calf Veins: No gross evidence of thrombus. Evaluation limited due to body habitus and edema. Superficial Great Saphenous Vein: No evidence of thrombus. Normal compressibility. Other Findings:  None. LEFT LOWER EXTREMITY Common Femoral Vein: No evidence of thrombus. Normal compressibility, respiratory phasicity and response to augmentation. Saphenofemoral Junction: There is a small adherent thrombus at the saphenofemoral junction extending into the common femoral vein, which has an appearance most consistent with chronic DVT. No occlusive thrombus is identified.  Profunda Femoral Vein: No evidence of thrombus. Normal compressibility and flow on color Doppler imaging. Femoral Vein: No evidence of thrombus. Normal compressibility, respiratory phasicity and response to augmentation. Popliteal Vein: Occlusive thrombus within the left popliteal vein. The vessel is noncompressible, with no color flow identified. Calf Veins: Limited evaluation due to body habitus and subcutaneous edema. Superficial Great Saphenous Vein: No evidence of thrombus. Normal compressibility. Other Findings:  None. IMPRESSION: 1. Occlusive acute deep venous thrombosis of the left popliteal vein. 2. Suspected chronic nonocclusive thrombus within the left common femoral vein at the saphenofemoral junction. 3. No evidence of deep venous thrombosis within the right lower extremity. 4. Limited evaluation of the calf vessels due to body habitus and subcutaneous edema. These results will be called to the ordering clinician or representative by the Radiologist Assistant, and communication documented in the PACS or [employer]. Electronically Signed   By: Sharlet Salina M.D.   On: 09/02/2021 12:32   ECHOCARDIOGRAM COMPLETE  Result Date: 09/01/2021    ECHOCARDIOGRAM REPORT   Patient Name:   VEGAS FRITZE Date of Exam: 09/01/2021 Medical Rec #:  [id number]     Height:       71.0 in Accession #:    [id number]    Weight:       498.0 lb Date of Birth:  1970-04-29      BSA:          3.107 m Patient Age:    51 years      BP:           110/81 mmHg Patient Gender: M             HR:           85 bpm. [facility] Procedure: 2D Echo, Color Doppler, Cardiac Doppler and Intracardiac            Opacification Agent Indications:    I50.9* Heart failure (unspecified)  History:        Patient has prior history of Echocardiogram examinations, most                 recent 03/06/2020. Risk Factors:Hypertension, Dyslipidemia and                 Sleep Apnea.  Sonographer:    Irving Burton Senior RDCS Referring Phys: [phone] JACOB J  STINSON  Sonographer Comments: Technically difficult study due to poor echo windows. Technically difficult study with very poor echo windows due to morbid obesity. IMPRESSIONS  1. Very technically difficult study with limited valve evaluation, Definity contrast given.  2. Left ventricular ejection fraction, by estimation, is 20 to 25%. Left ventricular ejection fraction by 2D MOD biplane is 25.0 %. The left ventricle has severely decreased function. The left ventricle demonstrates global hypokinesis. The left ventricular internal cavity size was moderately dilated. Left ventricular diastolic parameters are consistent with Grade I diastolic dysfunction (impaired relaxation).  3. Right ventricular systolic function was not well visualized. The right ventricular size is not well visualized.  4. The mitral valve was not well visualized. No evidence of mitral valve regurgitation.  5. The aortic valve was not well visualized. Aortic valve regurgitation is not visualized.  6. The inferior vena cava is dilated in size with <50% respiratory variability, suggesting right atrial pressure of 15 mmHg. Comparison(s): Changes from prior study are noted. 03/06/2020: LVEF 30-40%. FINDINGS  Left Ventricle: Left ventricular ejection fraction, by estimation, is 20 to 25%. Left ventricular ejection fraction by 2D MOD biplane is 25.0 %. The left ventricle has severely decreased function. The left ventricle demonstrates global hypokinesis. Definity contrast agent was given IV to delineate the left ventricular endocardial borders. The left ventricular internal cavity size was moderately dilated. Suboptimal image quality limits for assessment of left ventricular hypertrophy. Left ventricular  diastolic parameters are consistent with Grade I diastolic dysfunction (impaired relaxation). Indeterminate filling pressures. Right Ventricle: The right ventricular size is not well visualized. Right vetricular wall thickness was not well visualized.  Right ventricular systolic function was not well visualized. Left Atrium: Left atrial size was normal in size. Right Atrium: Right atrial size was normal in size. Pericardium: There is no evidence of pericardial effusion. Mitral Valve: The mitral valve was not well visualized. No evidence of mitral valve regurgitation. Tricuspid Valve: The tricuspid valve is not well visualized. Tricuspid valve regurgitation is not demonstrated. Aortic Valve: The aortic valve was not well visualized. Aortic valve regurgitation is not visualized. Pulmonic Valve: The pulmonic valve was not well visualized. Pulmonic valve regurgitation is not visualized. Aorta: The aortic root and ascending aorta are structurally normal, with no  evidence of dilitation. Venous: The inferior vena cava is dilated in size with less than 50% respiratory variability, suggesting right atrial pressure of 15 mmHg. IAS/Shunts: No atrial level shunt detected by color flow Doppler.  LEFT VENTRICLE PLAX 2D                        Biplane EF (MOD) LVOT diam:     2.30 cm         LV Biplane EF:   Left LV SV:         72                               ventricular LV SV Index:   23                               ejection LVOT Area:     4.15 cm                         fraction by                                                 2D MOD                                                 biplane is LV Volumes (MOD)                                25.0 %. LV vol d, MOD    192.0 ml A2C:                           Diastology LV vol d, MOD    237.0 ml      LV e' medial:    5.11 cm/s A4C:                           LV E/e' medial:  12.7 LV vol s, MOD    142.0 ml      LV e' lateral:   5.11 cm/s A2C:                           LV E/e' lateral: 12.7 LV vol s, MOD    186.0 ml A4C: LV SV MOD A2C:   50.0 ml LV SV MOD A4C:   237.0 ml LV SV MOD BP:    56.7 ml RIGHT VENTRICLE RV S prime:     12.60 cm/s TAPSE (M-mode): 2.3 cm LEFT ATRIUM             Index        RIGHT ATRIUM           Index LA Vol  (A2C):   85.8 ml 27.61 ml/m  RA Area:     22.80 cm LA Vol (A4C):   70.2 ml 22.59 ml/m  RA Volume:   73.80 ml  23.75 ml/m LA Biplane Vol: 80.7 ml 25.97 ml/m  AORTIC VALVE LVOT Vmax:   106.00 cm/s LVOT Vmean:  66.200 cm/s LVOT VTI:    0.174 m  AORTA Ao Root diam: 3.40 cm MITRAL VALVE MV Area (PHT): 3.65 cm    SHUNTS MV Decel Time: 208 msec    Systemic VTI:  0.17 m MV E velocity: 65.10 cm/s  Systemic Diam: 2.30 cm MV A velocity: 84.40 cm/s MV E/A ratio:  0.77 Zoila Shutter MD Electronically signed by Zoila Shutter MD Signature Date/Time: 09/01/2021/12:30:05 PM    Final    DG Chest Portable 1 View  Result Date: 08/31/2021 CLINICAL DATA:  Shortness of breath EXAM: PORTABLE CHEST 1 VIEW COMPARISON:  11/12/2020 FINDINGS: Cardiomegaly and congestion of central vessels. There is no edema, consolidation, effusion, or pneumothorax. ACDF hardware IMPRESSION: Cardiomegaly and central vascular congestion. Electronically Signed   By: Tiburcio Pea M.D.   On: 08/31/2021 11:48    Erick Blinks, MD [facility]  If 7PM-7AM, please contact night-coverage [email or web address]  09/08/2021, 6:37 PM   LOS: 8 days

## 2021-09-09 DIAGNOSIS — J9601 Acute respiratory failure with hypoxia: Secondary | ICD-10-CM | POA: Diagnosis not present

## 2021-09-09 DIAGNOSIS — I5043 Acute on chronic combined systolic (congestive) and diastolic (congestive) heart failure: Secondary | ICD-10-CM | POA: Diagnosis not present

## 2021-09-09 LAB — BASIC METABOLIC PANEL
Anion gap: 8 (ref 5–15)
BUN: 20 mg/dL (ref 6–20)
CO2: 30 mmol/L (ref 22–32)
Calcium: 9 mg/dL (ref 8.9–10.3)
Chloride: 99 mmol/L (ref 98–111)
Creatinine, Ser: 1.02 mg/dL (ref 0.61–1.24)
GFR, Estimated: >60 mL/min (ref >60)
Glucose, Bld: 107 mg/dL — ABNORMAL HIGH (ref 70–99)
Potassium: 4.3 mmol/L (ref 3.5–5.1)
Sodium: 137 mmol/L (ref 135–145)

## 2021-09-09 MED ORDER — TORSEMIDE 20 MG PO TABS
40.0000 mg | ORAL_TABLET | Freq: Two times a day (BID) | ORAL | Status: DC
  Administered 2021-09-09 – 2021-09-10 (×2): 40 mg via ORAL
  Filled 2021-09-09 (×2): qty 2

## 2021-09-09 NOTE — Progress Notes (Signed)
Pt has home CPAP unit, wife setup and place water in unit, RT instructed patient and family to call if assistance is needed.

## 2021-09-09 NOTE — Progress Notes (Signed)
Physical Therapy Treatment Patient Details Name: Jordan Jensen MRN: [id number] DOB: [birth year] Today's Date: 09/09/2021   History of Present Illness Jordan Jensen is a 51 y.o. male with medical history significant of combined systolic and diastolic heart failure, posttraumatic myelopathy with lower extremity paralysis, GERD, hypertension, morbid obesity, sleep apnea.  Patient presents with worsening shortness of breath over the past 2 days.  His legs are chronically swollen and has not noticed any increased difficulty.  He has been compliant with his medications.  No palliating or provoking factors.    PT Comments    Pt with wife in room and willing to participate, paired with OT for eval this session.   Pt required mod/max A for bed mobility and positioning LE upon sitting with mod A to reduce posterior lean while transitioning, required UE support to assist with seated posture.  Max A for repositioning on bed with pt using UE with assist with sliding toward HOB.  AROM for isometric strengthening exercises and AAROM for ankle, knee and hip mobility exercises.  EOS pt limited by fatigue, no reports of pain through session.     Recommendations for follow up therapy are one component of a multi-disciplinary discharge planning process, led by the attending physician.  Recommendations may be updated based on patient status, additional functional criteria and insurance authorization.  Follow Up Recommendations  Home health PT     Assistance Recommended at Discharge Set up Supervision/Assistance  Patient can return home with the following A lot of help with bathing/dressing/bathroom;A lot of help with walking and/or transfers;Help with stairs or ramp for entrance;Assistance with cooking/housework;Two people to help with walking and/or transfers   Equipment Recommendations  None recommended by PT    Recommendations for Other Services       Precautions / Restrictions Precautions Precautions:  Fall Restrictions Weight Bearing Restrictions: No     Mobility  Bed Mobility Overal bed mobility: Needs Assistance Bed Mobility: Supine to Sit, Sit to Supine     Supine to sit: Mod assist Sit to supine: Mod assist, Max assist   General bed mobility comments: Mod A for leg positioning, pt able to complete trunk rotation I wiht hand rail assistance, used bed decline to assist with scooting towards [facility] for repositioning and pt UE strength    Transfers                        Ambulation/Gait                   Stairs             Wheelchair Mobility    Modified Rankin (Stroke Patients Only)       Balance                                            Cognition Arousal/Alertness: Awake/alert Behavior During Therapy: WFL for tasks assessed/performed Overall Cognitive Status: Within Functional Limits for tasks assessed                                          Exercises General Exercises - Lower Extremity Ankle Circles/Pumps: Strengthening, Both, 10 reps, AAROM, Supine Quad Sets: AROM, Right, Both, Strengthening, 10 reps, Supine Gluteal Sets: AROM, Strengthening, Both,  10 reps, Other reps (comment) Heel Slides: AAROM, Both, 10 reps, Supine Hip ABduction/ADduction: AAROM, Strengthening, Both, 10 reps, Supine    General Comments        Pertinent Vitals/Pain Pain Assessment Pain Assessment: No/denies pain    Home Living                          Prior Function            PT Goals (current goals can now be found in the care plan section)      Frequency    Min 3X/week      PT Plan Current plan remains appropriate    Co-evaluation              AM-PAC PT "6 Clicks" Mobility   Outcome Measure  Help needed turning from your back to your side while in a flat bed without using bedrails?: A Lot Help needed moving from lying on your back to sitting on the side of a flat bed without using  bedrails?: A Lot Help needed moving to and from a bed to a chair (including a wheelchair)?: Total Help needed standing up from a chair using your arms (e.g., wheelchair or bedside chair)?: Total Help needed to walk in hospital room?: Total Help needed climbing 3-5 steps with a railing? : Total 6 Click Score: 8    End of Session   Activity Tolerance: Patient tolerated treatment well;Patient limited by fatigue Patient left: in bed;with call bell/phone within reach;with family/visitor present Nurse Communication: Mobility status PT Visit Diagnosis: Unsteadiness on feet (R26.81);Other abnormalities of gait and mobility (R26.89);Muscle weakness (generalized) (M62.81)     Time: 4680-3212 PT Time Calculation (min) (ACUTE ONLY): 25 min  Charges:  $Therapeutic Activity: 23-37 mins          Jordan Jensen, LPTA/CLT; CBIS [phone]  Jordan Jensen 09/09/2021, 12:05 PM

## 2021-09-09 NOTE — Evaluation (Signed)
Occupational Therapy Evaluation Patient Details Name: Jordan Jensen MRN: [id number] DOB: 1970-09-21 Today's Date: 09/09/2021   History of Present Illness Jordan Jensen is a 51 y.o. male with medical history significant of combined systolic and diastolic heart failure, posttraumatic myelopathy with lower extremity paralysis, GERD, hypertension, morbid obesity, sleep apnea.  Patient presents with worsening shortness of breath over the past 2 days.  His legs are chronically swollen and has not noticed any increased difficulty.  He has been compliant with his medications.  No palliating or provoking factors.   Clinical Impression   Pt agreeable to OT evaluation with PT treatment. Pt requires assist for lower body dressing and peri-care at baseline. Pt has not transferred without left chair in 6 months. Pt reports pain in L UE but demonstrates WFL A/ROM. Pain noted during manual muscle testing.  Pt required mod to max A for bed mobility with PT assisting as well. Pt reports having PT and OT prior to hospitalization. Recommend continued home health OT to ensure best performance for functional ADL's. Pt may benefit from seeing specialist about L UE shoulder pain. Pt will benefit from continued OT in the hospital and recommended venue below to increase strength, balance, and endurance for safe ADL's.        Recommendations for follow up therapy are one component of a multi-disciplinary discharge planning process, led by the attending physician.  Recommendations may be updated based on patient status, additional functional criteria and insurance authorization.   Follow Up Recommendations  Home health OT    Assistance Recommended at Discharge Intermittent Supervision/Assistance  Patient can return home with the following Two people to help with walking and/or transfers;A lot of help with bathing/dressing/bathroom;Assistance with cooking/housework;Assist for transportation;Help with stairs or ramp for  entrance    Functional Status Assessment  Patient has had a recent decline in their functional status and/or demonstrates limited ability to make significant improvements in function in a reasonable and predictable amount of time  Equipment Recommendations  None recommended by OT    Recommendations for Other Services       Precautions / Restrictions Precautions Precautions: Fall Restrictions Weight Bearing Restrictions: No      Mobility Bed Mobility Overal bed mobility: Needs Assistance Bed Mobility: Supine to Sit, Sit to Supine     Supine to sit: Mod assist Sit to supine: Mod assist, Max assist   General bed mobility comments: As per PT note    Transfers                          Balance Overall balance assessment: Needs assistance Sitting-balance support: Feet supported, No upper extremity supported Sitting balance-Leahy Scale: Fair Sitting balance - Comments: fair; holding on to rails.                                   ADL either performed or assessed with clinical judgement   ADL Overall ADL's : Needs assistance/impaired     Grooming: Minimal assistance;Sitting   Upper Body Bathing: Minimal assistance;Sitting   Lower Body Bathing: Maximal assistance;Bed level   Upper Body Dressing : Minimal assistance;Sitting;Min guard   Lower Body Dressing: Maximal assistance;Bed level Lower Body Dressing Details (indicate cue type and reason): Wife assisted with donning socks for pt while pt was in bed.  Vision Baseline Vision/History: 1 Wears glasses Ability to See in Adequate Light: 0 Adequate Patient Visual Report: No change from baseline Vision Assessment?: No apparent visual deficits                Pertinent Vitals/Pain Pain Assessment Pain Assessment: No/denies pain     Hand Dominance Right   Extremity/Trunk Assessment Upper Extremity Assessment Upper Extremity Assessment: Generalized  weakness;LUE deficits/detail LUE Deficits / Details: Reported pain in L shoulder possibly rotator cuff tear or arthritis. Pt has WFL A/ROM but pain with MMT.   Lower Extremity Assessment Lower Extremity Assessment: Defer to PT evaluation   Cervical / Trunk Assessment Cervical / Trunk Assessment: Normal   Communication Communication Communication: No difficulties   Cognition Arousal/Alertness: Awake/alert Behavior During Therapy: WFL for tasks assessed/performed Overall Cognitive Status: Within Functional Limits for tasks assessed                                                        Home Living Family/patient expects to be discharged to:: Private residence Living Arrangements: Spouse/significant other Available Help at Discharge: Family;Available PRN/intermittently Type of Home: House Home Access: Ramped entrance     Home Layout: One level     Bathroom Shower/Tub: Producer, television/film/video: Handicapped height Bathroom Accessibility: No   Home Equipment: Wheelchair - Copy;Other (comment) (hoyer)   Additional Comments: does not have access to bathroom due to wheelchair to wide, has hoyer lift (Per PT.)      Prior Functioning/Environment Prior Level of Function : Needs assist       Physical Assist : Mobility (physical);ADLs (physical) Mobility (physical): Bed mobility;Transfers;Gait;Stairs ADLs (physical): Dressing;Toileting;Bathing Mobility Comments: Mostly assisted transfers to wheelchair, uses wheelchair for mobility, non-ambulatory x 6 months (per PT) ADLs Comments: Assisted by family for toileting and dressing. Assisted IADL's.        OT Problem List: Decreased strength;Decreased activity tolerance;Impaired balance (sitting and/or standing);Obesity      OT Treatment/Interventions: Self-care/ADL training;Therapeutic activities;Patient/family education;Therapeutic exercise    OT Goals(Current goals can be  found in the care plan section) Acute Rehab OT Goals Patient Stated Goal: return home OT Goal Formulation: With patient Time For Goal Achievement: 09/23/21 Potential to Achieve Goals: Fair  OT Frequency: Min 1X/week    Co-evaluation PT/OT/SLP Co-Evaluation/Treatment: Yes Reason for Co-Treatment: Complexity of the patient's impairments (multi-system involvement)   OT goals addressed during session: ADL's and self-care                       End of Session    Activity Tolerance: Patient tolerated treatment well Patient left: in chair;with call bell/phone within reach  OT Visit Diagnosis: Muscle weakness (generalized) (M62.81)                Time: 2595-6387 OT Time Calculation (min): 19 min Charges:  OT General Charges $OT Visit: 1 Visit OT Evaluation $OT Eval Low Complexity: 1 Low  Otisha Spickler OT, MOT  Danie Chandler 09/09/2021, 12:52 PM

## 2021-09-09 NOTE — Progress Notes (Signed)
PROGRESS NOTE  Jordan Jensen HDQ:[id number] DOB: 1970/03/25 DOA: 08/31/2021 PCP: Donell Beers, FNP  Brief History:  Wt Readings from Last 3 Encounters:  08/01/21 (!) 480 lb (217.7 kg)  07/30/21 (!) 450 lb (204.1 kg)  06/04/21 (!) 460 lb (41.75 kg)  51 year old male with a history of traumatic myelopathy C6/7, morbid obesity, asthma, lymphedema, OSA, systolic CHF, hypertension, hyperlipidemia presenting with 3-day history of shortness of breath.  The patient's wife at the bedside who supplements the history.  She states that she has noted increasing peripheral edema and increasing abdominal girth at least for the past week.  The patient states that he has actually had worsening shortness of breath and orthopnea for the better part of at least a week.  He denies any fevers, chills, chest pain, headache, nausea, vomiting, diarrhea, abdominal pain. Patient states that he has been poorly compliant with CPAP at home.  In addition, he states that he continues to be only taking his Entresto once daily rather than twice daily.  He states that he has been taking furosemide 40 mg on a daily basis although he does withhold taking it if he has a physician appointment or has somewhere to go. In the ED, the patient will had a low-grade temperature of 99.4 F.  He was hemodynamically stable with oxygen saturation 95 to 100% on room air.  WBC 4.4, hemoglobin 14.0, platelets 230,000.  Sodium 137, potassium 3.9, bicarbonate 38, serum creatinine 0.83.  BNP was 153.0.  Chest x-ray showed increased vascular congestion.  The patient was started on IV furosemide and admitted for further evaluation and treatment.    Assessment and Plan: * Acute on chronic combined systolic and diastolic CHF (congestive heart failure) (HCC) Spouse states that the patient weighed about 435 pounds about 9 months ago. Patient appears at least 20 pounds overweight Difficult to assess fluid status given the patient's body  habitus Obtain ReDs reading--unable due to weight limitations Currently on IV Lasix 80 mg IV twice daily -Urine output less than 2 L in the past 24 hours -We will consider transitioning to [facility] today and monitor for 24 hours Continue toprol Entresto stopped due to low BP Weight continues to trend down, currently 406 pounds    Acute deep vein thrombosis (DVT) of popliteal vein of left lower extremity (HCC) Started apixaban 8/28 CTA chest--no PE  Elevated troponin Due demand ischemia is setting of decompensated CHF -no chest pain reported - Prior Myoview Lexiscan with no ischemia  Perirectal fistula Outpatient follow-up with general surgery Gluteal/perianal area not grossly infected on exam  Hyperlipidemia Continue statin  OSA (obstructive sleep apnea) Poor compliance at home with CPAP I have asked the patient to bring in his home CPAP machine ---he is able to wear it 3-4 hours  Morbid obesity with BMI of 60.0-69.9, adult (HCC) Lifestyle modification BMI 56.6  Pulmonary nodule, left Outpatient surveillance   Family Communication:   spouse updated 9/4   Consultants:  cardiology   Code Status:  FULL    DVT Prophylaxis:  apixaban     Procedures: As Listed in Progress Note Above   Antibiotics: None        Subjective:  Says he feels better every day.  Feels that shortness of breath is improving.  Objective: Vitals:   09/08/21 2137 09/09/21 0452 09/09/21 0500 09/09/21 0703  BP: 93/60 107/85    Pulse: 80 77    Resp: 18 17  Temp: 98.3 F (36.8 C) 98.3 F (36.8 C)    TempSrc:  Oral    SpO2: 96% 94%  94%  Weight:   (!) 184.2 kg   Height:        Intake/Output Summary (Last 24 hours) at 09/09/2021 1247 Last data filed at 09/09/2021 0900 Gross per 24 hour  Intake 960 ml  Output 1100 ml  Net -140 ml   Weight change: -0.454 kg Exam:  General:  Pt is alert, follows commands appropriately, not in acute distress HEENT: No icterus, No thrush, No  neck mass, [place]/AT Cardiovascular: RRR, S1/S2, no rubs, no gallops Respiratory: bibasilar rales. No wheeze Abdomen: Soft/+BS, non tender, non distended, no guarding Extremities: 1 + LE edema, No lymphangitis, No petechiae, No rashes, no synovitis   Data Reviewed: I have personally reviewed following labs and imaging studies Basic Metabolic Panel: Recent Labs  Lab 09/04/21 0254 09/05/21 0418 09/06/21 0319 09/06/21 0800 09/07/21 0444 09/08/21 0426 09/09/21 0505  NA 136 138 139  --  138 135 137  K 4.0 4.1 3.8  --  4.4 4.5 4.3  CL 96* 99 99  --  100 98 99  CO2 30 32 32  --  GLUCOSE 118* 101* 113*  --  102* 104* 107*  BUN --  CREATININE 1.01 1.06 0.99  --  0.98 0.99 1.02  CALCIUM 8.6* 8.8* 9.0  --  8.9 8.8* 9.0  MG 2.1  --   --  2.2  --   --   --    Liver Function Tests: No results for input(s): "AST", "ALT", "ALKPHOS", "BILITOT", "PROT", "ALBUMIN" in the last 168 hours. No results for input(s): "LIPASE", "AMYLASE" in the last 168 hours. No results for input(s): "AMMONIA" in the last 168 hours. Coagulation Profile: No results for input(s): "INR", "PROTIME" in the last 168 hours. CBC: No results for input(s): "WBC", "NEUTROABS", "HGB", "HCT", "MCV", "PLT" in the last 168 hours.  Cardiac Enzymes: No results for input(s): "CKTOTAL", "CKMB", "CKMBINDEX", "TROPONINI" in the last 168 hours. BNP: Invalid input(s): "POCBNP" CBG: No results for input(s): "GLUCAP" in the last 168 hours. HbA1C: No results for input(s): "HGBA1C" in the last 72 hours. Urine analysis:    Component Value Date/Time   COLORURINE YELLOW 06/04/2021 1324   APPEARANCEUR CLEAR 06/04/2021 1324   LABSPEC 1.016 06/04/2021 1324   PHURINE 7.0 06/04/2021 1324   GLUCOSEU NEGATIVE 06/04/2021 1324   HGBUR NEGATIVE 06/04/2021 1324   BILIRUBINUR NEGATIVE 06/04/2021 1324   BILIRUBINUR negative 08/26/2020 1558   BILIRUBINUR NEG 06/13/2015 1650   KETONESUR NEGATIVE 06/04/2021 1324    PROTEINUR NEGATIVE 06/04/2021 1324   UROBILINOGEN 0.2 08/26/2020 1558   UROBILINOGEN 1.0 09/26/2018 1619   NITRITE NEGATIVE 06/04/2021 1324   LEUKOCYTESUR NEGATIVE 06/04/2021 1324   Sepsis Labs: (procalcitonin:4,lacticidven:4) ) Recent Results (from the past 240 hour(s))  Resp Panel by RT-PCR (Flu A&B, Covid) Anterior Nasal Swab     Status: None   Collection Time: 08/31/21 12:10 PM   Specimen: Anterior Nasal Swab  Result Value Ref Range Status   SARS Coronavirus 2 by RT PCR NEGATIVE NEGATIVE Final    Comment: (NOTE) SARS-CoV-2 target nucleic acids are NOT DETECTED.  The SARS-CoV-2 RNA is generally detectable in upper respiratory specimens during the acute phase of infection. The lowest concentration of SARS-CoV-2 viral copies this assay can detect is 138 copies/mL. A negative result does not preclude SARS-Cov-2 infection and should not be  used as the sole basis for treatment or other patient management decisions. A negative result may occur with  improper specimen collection/handling, submission of specimen other than nasopharyngeal swab, presence of viral mutation(s) within the areas targeted by this assay, and inadequate number of viral copies(<138 copies/mL). A negative result must be combined with clinical observations, patient history, and epidemiological information. The expected result is Negative.  Fact Sheet for Patients:  BloggerCourse.com  Fact Sheet for Healthcare Providers:  SeriousBroker.it  This test is no t yet approved or cleared by the [place] FDA and  has been authorized for detection and/or diagnosis of SARS-CoV-2 by FDA under an Emergency Use Authorization (EUA). This EUA will remain  in effect (meaning this test can be used) for the duration of the COVID-19 declaration under Section 564(b)(1) of the Act, 21 U.S.C.section 360bbb-3(b)(1), unless the authorization is terminated  or revoked  sooner.       Influenza A by PCR NEGATIVE NEGATIVE Final   Influenza B by PCR NEGATIVE NEGATIVE Final    Comment: (NOTE) The Xpert Xpress SARS-CoV-2/FLU/RSV plus assay is intended as an aid in the diagnosis of influenza from Nasopharyngeal swab specimens and should not be used as a sole basis for treatment. Nasal washings and aspirates are unacceptable for Xpert Xpress SARS-CoV-2/FLU/RSV testing.  Fact Sheet for Patients: BloggerCourse.com  Fact Sheet for Healthcare Providers: SeriousBroker.it  This test is not yet approved or cleared by the [place] FDA and has been authorized for detection and/or diagnosis of SARS-CoV-2 by FDA under an Emergency Use Authorization (EUA). This EUA will remain in effect (meaning this test can be used) for the duration of the COVID-19 declaration under Section 564(b)(1) of the Act, 21 U.S.C. section 360bbb-3(b)(1), unless the authorization is terminated or revoked.  Performed at [facility], [address] [place], [place] [address]      Scheduled Meds:  apixaban  5 mg Oral BID   fluticasone furoate-vilanterol  1 puff Inhalation Daily   metoprolol succinate  25 mg Oral Daily   potassium chloride  40 mEq Oral BID   rosuvastatin  20 mg Oral Daily   torsemide  40 mg Oral BID   Continuous Infusions:  Procedures/Studies: CT Angio Chest Pulmonary Embolism (PE) W or WO Contrast  Addendum Date: 09/02/2021   ADDENDUM REPORT: 09/02/2021 15:49 ADDENDUM: Following should be added to the impression. There is a 5 mm nodule in left lower lung field. No follow-up needed if patient is low-risk.This recommendation follows the consensus statement: Guidelines for Management of Incidental Pulmonary Nodules Detected on CT Images: From the Fleischner Society 2017; Radiology 2017; 284:228-243. Electronically Signed   By: Ernie Avena M.D.   On: 09/02/2021 15:49   Result Date: 09/02/2021 CLINICAL DATA:   DVT lower extremity, positive D-dimer EXAM: CT ANGIOGRAPHY CHEST WITH CONTRAST TECHNIQUE: Multidetector CT imaging of the chest was performed using the standard protocol during bolus administration of intravenous contrast. Multiplanar CT image reconstructions and MIPs were obtained to evaluate the vascular anatomy. RADIATION DOSE REDUCTION: This exam was performed according to the departmental dose-optimization program which includes automated exposure control, adjustment of the mA and/or kV according to patient size and/or use of iterative reconstruction technique. CONTRAST:  OMNIPAQUE IOHEXOL 350 MG/ML SOLN COMPARISON:  Previous studies including the chest radiograph done on 08/31/2021 FINDINGS: Cardiovascular: Heart is enlarged in size. The dilation of left ventricle. There is homogeneous enhancement in thoracic aorta. There are no intraluminal filling defects in central pulmonary artery branches. Evaluation of  small peripheral branches in the lower lung fields is limited by infiltrates. Mediastinum/Nodes: Unremarkable. Lungs/Pleura: There are small linear patchy densities in both lower lung fields suggesting subsegmental atelectasis/pneumonia. In image 91 of series 6, 6 mm nodule is seen in left lower lobe. Minimal bilateral pleural effusions are seen there is no pneumothorax. Upper Abdomen: There is fatty infiltration in liver. Musculoskeletal: No acute findings are seen. Review of the MIP images confirms the above findings. IMPRESSION: There is no evidence of central pulmonary artery embolism. Evaluation of small peripheral branches in the lower lung fields is limited by infiltrates. There is no evidence of thoracic aortic dissection. Small linear patchy densities in the lower lung fields suggest subsegmental atelectasis/pneumonia. Cardiomegaly.  Fatty liver. Electronically Signed: By: Ernie Avena M.D. On: 09/02/2021 15:02   US Venous Img Lower Bilateral (DVT)  Result Date:  09/02/2021 CLINICAL DATA:  Short of breath for 4 days, bilateral lower extremity edema EXAM: BILATERAL LOWER EXTREMITY VENOUS DOPPLER ULTRASOUND TECHNIQUE: Gray-scale sonography with graded compression, as well as color Doppler and duplex ultrasound were performed to evaluate the lower extremity deep venous systems from the level of the common femoral vein and including the common femoral, femoral, profunda femoral, popliteal and calf veins including the posterior tibial, peroneal and gastrocnemius veins when visible. The superficial great saphenous vein was also interrogated. Spectral Doppler was utilized to evaluate flow at rest and with distal augmentation maneuvers in the common femoral, femoral and popliteal veins. COMPARISON:  None Available. FINDINGS: RIGHT LOWER EXTREMITY Common Femoral Vein: No evidence of thrombus. Normal compressibility, respiratory phasicity and response to augmentation. Saphenofemoral Junction: No evidence of thrombus. Normal compressibility and flow on color Doppler imaging. Profunda Femoral Vein: No evidence of thrombus. Normal compressibility and flow on color Doppler imaging. Femoral Vein: No evidence of thrombus. Normal compressibility, respiratory phasicity and response to augmentation. Popliteal Vein: No evidence of thrombus. Normal compressibility, respiratory phasicity and response to augmentation. Calf Veins: No gross evidence of thrombus. Evaluation limited due to body habitus and edema. Superficial Great Saphenous Vein: No evidence of thrombus. Normal compressibility. Other Findings:  None. LEFT LOWER EXTREMITY Common Femoral Vein: No evidence of thrombus. Normal compressibility, respiratory phasicity and response to augmentation. Saphenofemoral Junction: There is a small adherent thrombus at the saphenofemoral junction extending into the common femoral vein, which has an appearance most consistent with chronic DVT. No occlusive thrombus is identified. Profunda Femoral Vein:  No evidence of thrombus. Normal compressibility and flow on color Doppler imaging. Femoral Vein: No evidence of thrombus. Normal compressibility, respiratory phasicity and response to augmentation. Popliteal Vein: Occlusive thrombus within the left popliteal vein. The vessel is noncompressible, with no color flow identified. Calf Veins: Limited evaluation due to body habitus and subcutaneous edema. Superficial Great Saphenous Vein: No evidence of thrombus. Normal compressibility. Other Findings:  None. IMPRESSION: 1. Occlusive acute deep venous thrombosis of the left popliteal vein. 2. Suspected chronic nonocclusive thrombus within the left common femoral vein at the saphenofemoral junction. 3. No evidence of deep venous thrombosis within the right lower extremity. 4. Limited evaluation of the calf vessels due to body habitus and subcutaneous edema. These results will be called to the ordering clinician or representative by the Radiologist Assistant, and communication documented in the PACS or [employer]. Electronically Signed   By: Sharlet Salina M.D.   On: 09/02/2021 12:32   ECHOCARDIOGRAM COMPLETE  Result Date: 09/01/2021    ECHOCARDIOGRAM REPORT   Patient Name:   BANNON GIAMMARCO Date of Exam:  09/01/2021 Medical Rec #:  [id number]     Height:       71.0 in Accession #:    [id number]    Weight:       498.0 lb Date of Birth:  08-11-1970      BSA:          3.107 m Patient Age:    51 years      BP:           110/81 mmHg Patient Gender: M             HR:           85 bpm. [facility] Procedure: 2D Echo, Color Doppler, Cardiac Doppler and Intracardiac            Opacification Agent Indications:    I50.9* Heart failure (unspecified)  History:        Patient has prior history of Echocardiogram examinations, most                 recent 03/06/2020. Risk Factors:Hypertension, Dyslipidemia and                 Sleep Apnea.  Sonographer:    Irving Burton Senior RDCS Referring Phys: [phone] JACOB J STINSON  Sonographer  Comments: Technically difficult study due to poor echo windows. Technically difficult study with very poor echo windows due to morbid obesity. IMPRESSIONS  1. Very technically difficult study with limited valve evaluation, Definity contrast given.  2. Left ventricular ejection fraction, by estimation, is 20 to 25%. Left ventricular ejection fraction by 2D MOD biplane is 25.0 %. The left ventricle has severely decreased function. The left ventricle demonstrates global hypokinesis. The left ventricular internal cavity size was moderately dilated. Left ventricular diastolic parameters are consistent with Grade I diastolic dysfunction (impaired relaxation).  3. Right ventricular systolic function was not well visualized. The right ventricular size is not well visualized.  4. The mitral valve was not well visualized. No evidence of mitral valve regurgitation.  5. The aortic valve was not well visualized. Aortic valve regurgitation is not visualized.  6. The inferior vena cava is dilated in size with <50% respiratory variability, suggesting right atrial pressure of 15 mmHg. Comparison(s): Changes from prior study are noted. 03/06/2020: LVEF 30-40%. FINDINGS  Left Ventricle: Left ventricular ejection fraction, by estimation, is 20 to 25%. Left ventricular ejection fraction by 2D MOD biplane is 25.0 %. The left ventricle has severely decreased function. The left ventricle demonstrates global hypokinesis. Definity contrast agent was given IV to delineate the left ventricular endocardial borders. The left ventricular internal cavity size was moderately dilated. Suboptimal image quality limits for assessment of left ventricular hypertrophy. Left ventricular  diastolic parameters are consistent with Grade I diastolic dysfunction (impaired relaxation). Indeterminate filling pressures. Right Ventricle: The right ventricular size is not well visualized. Right vetricular wall thickness was not well visualized. Right ventricular  systolic function was not well visualized. Left Atrium: Left atrial size was normal in size. Right Atrium: Right atrial size was normal in size. Pericardium: There is no evidence of pericardial effusion. Mitral Valve: The mitral valve was not well visualized. No evidence of mitral valve regurgitation. Tricuspid Valve: The tricuspid valve is not well visualized. Tricuspid valve regurgitation is not demonstrated. Aortic Valve: The aortic valve was not well visualized. Aortic valve regurgitation is not visualized. Pulmonic Valve: The pulmonic valve was not well visualized. Pulmonic valve regurgitation is not visualized. Aorta: The aortic root and ascending aorta  are structurally normal, with no evidence of dilitation. Venous: The inferior vena cava is dilated in size with less than 50% respiratory variability, suggesting right atrial pressure of 15 mmHg. IAS/Shunts: No atrial level shunt detected by color flow Doppler.  LEFT VENTRICLE PLAX 2D                        Biplane EF (MOD) LVOT diam:     2.30 cm         LV Biplane EF:   Left LV SV:         72                               ventricular LV SV Index:   23                               ejection LVOT Area:     4.15 cm                         fraction by                                                 2D MOD                                                 biplane is LV Volumes (MOD)                                25.0 %. LV vol d, MOD    192.0 ml A2C:                           Diastology LV vol d, MOD    237.0 ml      LV e' medial:    5.11 cm/s A4C:                           LV E/e' medial:  12.7 LV vol s, MOD    142.0 ml      LV e' lateral:   5.11 cm/s A2C:                           LV E/e' lateral: 12.7 LV vol s, MOD    186.0 ml A4C: LV SV MOD A2C:   50.0 ml LV SV MOD A4C:   237.0 ml LV SV MOD BP:    56.7 ml RIGHT VENTRICLE RV S prime:     12.60 cm/s TAPSE (M-mode): 2.3 cm LEFT ATRIUM             Index        RIGHT ATRIUM           Index LA Vol (A2C):   85.8 ml  27.61 ml/m  RA Area:     22.80 cm LA Vol (A4C):   70.2 ml 22.59 ml/m  RA  Volume:   73.80 ml  23.75 ml/m LA Biplane Vol: 80.7 ml 25.97 ml/m  AORTIC VALVE LVOT Vmax:   106.00 cm/s LVOT Vmean:  66.200 cm/s LVOT VTI:    0.174 m  AORTA Ao Root diam: 3.40 cm MITRAL VALVE MV Area (PHT): 3.65 cm    SHUNTS MV Decel Time: 208 msec    Systemic VTI:  0.17 m MV E velocity: 65.10 cm/s  Systemic Diam: 2.30 cm MV A velocity: 84.40 cm/s MV E/A ratio:  0.77 Zoila Shutter MD Electronically signed by Zoila Shutter MD Signature Date/Time: 09/01/2021/12:30:05 PM    Final    DG Chest Portable 1 View  Result Date: 08/31/2021 CLINICAL DATA:  Shortness of breath EXAM: PORTABLE CHEST 1 VIEW COMPARISON:  11/12/2020 FINDINGS: Cardiomegaly and congestion of central vessels. There is no edema, consolidation, effusion, or pneumothorax. ACDF hardware IMPRESSION: Cardiomegaly and central vascular congestion. Electronically Signed   By: Tiburcio Pea M.D.   On: 08/31/2021 11:48    Erick Blinks, MD [facility]  If 7PM-7AM, please contact night-coverage [email or web address]  09/09/2021, 12:47 PM   LOS: 9 days

## 2021-09-09 NOTE — TOC Progression Note (Signed)
Transition of Care [facility]) - Progression Note    Patient Details  Name: Jordan Jensen MRN: [id number] Date of Birth: 13-May-1970  Transition of Care [facility]) CM/SW Contact  Leitha Bleak, RN Phone Number: 09/09/2021, 11:03 AM  Clinical Narrative:   Patient requesting a Slide board for home use. Order placed and Zach with [employer] will get delivered to patient.    Expected Discharge Plan: Home w Home Health Services Barriers to Discharge: Continued Medical Work up  Expected Discharge Plan and Services Expected Discharge Plan: Home w Home Health Services In-house Referral: Clinical Social Work   Post Acute Care Choice: Home Health Living arrangements for the past 2 months: Single Family Home                   Readmission Risk Interventions    09/09/2021   11:03 AM  Readmission Risk Prevention Plan  Post Dischage Appt Not Complete  Medication Screening Complete  Transportation Screening Complete

## 2021-09-09 NOTE — Plan of Care (Signed)
  Problem: Acute Rehab OT Goals (only OT should resolve) Goal: Pt. Will Perform Upper Body Bathing Flowsheets (Taken 09/09/2021 1256) Pt Will Perform Upper Body Bathing:  Independently  sitting Goal: Pt. Will Perform Upper Body Dressing Flowsheets (Taken 09/09/2021 1256) Pt Will Perform Upper Body Dressing:  Independently  sitting Goal: Pt/Caregiver Will Perform Home Exercise Program Flowsheets (Taken 09/09/2021 1256) Pt/caregiver will Perform Home Exercise Program:  Increased strength  Left upper extremity  Independently  Rease Wence OT, MOT

## 2021-09-10 DIAGNOSIS — I5043 Acute on chronic combined systolic (congestive) and diastolic (congestive) heart failure: Secondary | ICD-10-CM | POA: Diagnosis not present

## 2021-09-10 DIAGNOSIS — J9601 Acute respiratory failure with hypoxia: Secondary | ICD-10-CM | POA: Diagnosis not present

## 2021-09-10 DIAGNOSIS — R778 Other specified abnormalities of plasma proteins: Secondary | ICD-10-CM | POA: Diagnosis not present

## 2021-09-10 DIAGNOSIS — I82432 Acute embolism and thrombosis of left popliteal vein: Secondary | ICD-10-CM | POA: Diagnosis not present

## 2021-09-10 LAB — BASIC METABOLIC PANEL
Anion gap: 8 (ref 5–15)
BUN: 19 mg/dL (ref 6–20)
CO2: 31 mmol/L (ref 22–32)
Calcium: 8.8 mg/dL — ABNORMAL LOW (ref 8.9–10.3)
Chloride: 99 mmol/L (ref 98–111)
Creatinine, Ser: 1 mg/dL (ref 0.61–1.24)
GFR, Estimated: >60 mL/min (ref >60)
Glucose, Bld: 101 mg/dL — ABNORMAL HIGH (ref 70–99)
Potassium: 3.9 mmol/L (ref 3.5–5.1)
Sodium: 138 mmol/L (ref 135–145)

## 2021-09-10 LAB — MAGNESIUM: Magnesium: 2 mg/dL (ref 1.7–2.4)

## 2021-09-10 MED ORDER — TORSEMIDE 40 MG PO TABS: 40.0000 mg | ORAL_TABLET | Freq: Two times a day (BID) | ORAL | 1 refills | Status: DC

## 2021-09-10 MED ORDER — POTASSIUM CHLORIDE CRYS ER 20 MEQ PO TBCR: 20.0000 meq | EXTENDED_RELEASE_TABLET | Freq: Two times a day (BID) | ORAL | 1 refills | Status: DC

## 2021-09-10 MED ORDER — APIXABAN 5 MG PO TABS: 5.0000 mg | ORAL_TABLET | Freq: Two times a day (BID) | ORAL | 1 refills | Status: DC

## 2021-09-10 MED ORDER — BUDESONIDE-FORMOTEROL FUMARATE 160-4.5 MCG/ACT IN AERO: 2.0000 | INHALATION_SPRAY | Freq: Two times a day (BID) | RESPIRATORY_TRACT | 12 refills | Status: DC

## 2021-09-10 MED ORDER — POTASSIUM CHLORIDE 20 MEQ PO PACK
20.0000 meq | PACK | Freq: Once | ORAL | Status: AC
  Administered 2021-09-10: 20 meq via ORAL
  Filled 2021-09-10: qty 1

## 2021-09-10 MED ORDER — METOPROLOL SUCCINATE ER 25 MG PO TB24: 25.0000 mg | ORAL_TABLET | Freq: Every day | ORAL | 3 refills | Status: DC

## 2021-09-10 MED ORDER — EMPAGLIFLOZIN 10 MG PO TABS: 10.0000 mg | ORAL_TABLET | Freq: Every day | ORAL | 1 refills | Status: DC

## 2021-09-10 MED ORDER — EMPAGLIFLOZIN 10 MG PO TABS
10.0000 mg | ORAL_TABLET | Freq: Every day | ORAL | Status: DC
  Administered 2021-09-10: 10 mg via ORAL
  Filled 2021-09-10: qty 1

## 2021-09-10 NOTE — TOC Transition Note (Signed)
Transition of Care [facility]) - CM/SW Discharge Note   Patient Details  Name: Jordan Jensen MRN: [id number] Date of Birth: [birth year]  Transition of Care [facility]) CM/SW Contact:  Leitha Bleak, RN Phone Number: 09/10/2021, 10:50 AM   Clinical Narrative:   Patient discharging home. Updating [facility] with [facility], orders have been placed.    Final next level of care: Home w Home Health Services Barriers to Discharge: Barriers Resolved   Patient Goals and CMS Choice Patient states their goals for this hospitalization and ongoing recovery are:: "get back to normal"   Choice offered to / list presented to : Patient (wants to continue with [facility])  Discharge Placement           Patient and family notified of of transfer: 09/10/21  Discharge Plan and Services In-house Referral: Clinical Social Work   Post Acute Care Choice: Home Health              Readmission Risk Interventions    09/09/2021   11:03 AM  Readmission Risk Prevention Plan  Post Dischage Appt Not Complete  Medication Screening Complete  Transportation Screening Complete

## 2021-09-10 NOTE — Discharge Summary (Signed)
Physician Discharge Summary  Jordan Jensen [id number] DOB: March 11, 1970 DOA: 08/31/2021  PCP: Renee Rival, FNP  Admit date: 08/31/2021 Discharge date: 09/10/2021  Admitted From: home Disposition:  home  Recommendations for Outpatient Follow-up:  Follow up with PCP in 1-2 weeks Please obtain BMP/CBC in one week Follow up with cardiology has been scheduled Follow up with general surgery as outpatient for perirectal fistula   Home Health: Equipment/Devices:  Discharge Condition:stable CODE STATUS:full code Diet recommendation: heart healthy  Brief/Interim Summary: 51 year old male with a history of traumatic myelopathy C6/7, morbid obesity, asthma, lymphedema, OSA, systolic CHF, hypertension, hyperlipidemia presenting with 3-day history of shortness of breath.  The patient's wife at the bedside who supplements the history.  She states that she has noted increasing peripheral edema and increasing abdominal girth at least for the past week.  The patient states that he has actually had worsening shortness of breath and orthopnea for the better part of at least a week.  He denies any fevers, chills, chest pain, headache, nausea, vomiting, diarrhea, abdominal pain. Patient states that he has been poorly compliant with CPAP at home.  In addition, he states that he continues to be only taking his Entresto once daily rather than twice daily.  He states that he has been taking furosemide 40 mg on a daily basis although he does withhold taking it if he has a physician appointment or has somewhere to go. In the ED, the patient will had a low-grade temperature of 99.4 F.  He was hemodynamically stable with oxygen saturation 95 to 100% on room air.  WBC 4.4, hemoglobin 14.0, platelets 230,000.  Sodium 137, potassium 3.9, bicarbonate 38, serum creatinine 0.83.  BNP was 153.0.  Chest x-ray showed increased vascular congestion.  The patient was started on IV furosemide and admitted for further  evaluation and treatment.   Discharge Diagnoses:  Principal Problem:   Acute on chronic combined systolic and diastolic CHF (congestive heart failure) (HCC) Active Problems:   Acute respiratory failure with hypoxia (HCC)   Acute deep vein thrombosis (DVT) of popliteal vein of left lower extremity (HCC)   Post traumatic myelopathy (HCC)   Essential hypertension   Quadriplegia and quadriparesis (HCC)   Pulmonary nodule, left   Morbid obesity with BMI of 60.0-69.9, adult (HCC)   OSA (obstructive sleep apnea)   Hyperlipidemia   Perirectal fistula   Elevated troponin  Assessment and Plan: * Acute on chronic combined systolic and diastolic CHF (congestive heart failure) ([place]) Spouse states that the patient weighed about 435 pounds about 9 months ago. Patient appears at least 20 pounds overweight Difficult to assess fluid status given the patient's body habitus Obtain ReDs reading--unable due to weight limitations Treated with Lasix 80 mg IV twice daily Eventually transitioned to demadex 40mg  bid when volume status was improved Continue Toprol Entresto stopped due to low BP Started on Jardiance Weight continues to trend down, currently 406 pounds   Acute deep vein thrombosis (DVT) of popliteal vein of left lower extremity (HCC) Started apixaban 8/28 CTA chest--no PE  Elevated troponin Due demand ischemia is setting of decompensated CHF -no chest pain reported - Prior Myoview Lexiscan with no ischemia  Perirectal fistula Outpatient follow-up with general surgery Gluteal/perianal area not grossly infected on exam  Hyperlipidemia Continue statin  OSA (obstructive sleep apnea) Poor compliance at home with CPAP I have asked the patient to bring in his home CPAP machine ---he is able to wear it 3-4 hours  Morbid obesity with BMI  of 60.0-69.9, adult ([place]) Lifestyle modification BMI 56.6  Pulmonary nodule, left No follow up needed since he is low risk.  Discharge  Instructions  Discharge Instructions     Diet - low sodium heart healthy   Complete by: As directed    Increase activity slowly   Complete by: As directed       Allergies as of 09/10/2021       Reactions   Latex Itching, Rash   cellulitis        Medication List     STOP taking these medications    budesonide-formoterol 80-4.5 MCG/ACT inhaler Commonly known as: Symbicort Replaced by: budesonide-formoterol 160-4.5 MCG/ACT inhaler   Entresto 24-26 MG Generic drug: sacubitril-valsartan   furosemide 40 MG tablet Commonly known as: LASIX       TAKE these medications    acetaminophen 500 MG tablet Commonly known as: TYLENOL Take 500 mg by mouth every 6 (six) hours as needed for moderate pain.   acetaminophen 325 MG tablet Commonly known as: TYLENOL Take 2 tablets (650 mg total) by mouth every 6 (six) hours as needed for mild pain (or Fever >/= 101).   albuterol 108 (90 Base) MCG/ACT inhaler Commonly known as: Ventolin HFA Inhale 2 puffs into the lungs every 6 (six) hours as needed for wheezing or shortness of breath.   albuterol (2.5 MG/3ML) 0.083% nebulizer solution Commonly known as: PROVENTIL Take 3 mLs (2.5 mg total) by nebulization every 6 (six) hours as needed for wheezing or shortness of breath.   apixaban 5 MG Tabs tablet Commonly known as: ELIQUIS Take 1 tablet (5 mg total) by mouth 2 (two) times daily.   budesonide-formoterol 160-4.5 MCG/ACT inhaler Commonly known as: Symbicort Inhale 2 puffs into the lungs in the morning and at bedtime. Brush tongue and rinse mouth afterwards Replaces: budesonide-formoterol 80-4.5 MCG/ACT inhaler   cetirizine 10 MG tablet Commonly known as: ZYRTEC Take 10 mg by mouth daily as needed for allergies.   diclofenac Sodium 1 % Gel Commonly known as: Voltaren Apply 2 g topically 4 (four) times daily. What changed:  when to take this reasons to take this   empagliflozin 10 MG Tabs tablet Commonly known as:  JARDIANCE Take 1 tablet (10 mg total) by mouth daily. Start taking on: September 11, 2021   fluticasone 50 MCG/ACT nasal spray Commonly known as: FLONASE Place 2 sprays into both nostrils daily.   metoprolol succinate 25 MG 24 hr tablet Commonly known as: Toprol XL Take 1 tablet (25 mg total) by mouth daily.   MULTIVITAMIN ADULTS PO Take 1 tablet by mouth daily.   potassium chloride SA 20 MEQ tablet Commonly known as: KLOR-CON M Take 1 tablet (20 mEq total) by mouth 2 (two) times daily.   rosuvastatin 20 MG tablet Commonly known as: Crestor Take 1 tablet (20 mg total) by mouth daily.   Torsemide 40 MG Tabs Take 40 mg by mouth 2 (two) times daily.   VITAMIN B 12 PO Take 1 tablet by mouth daily.               Durable Medical Equipment  (From admission, onward)           Start     Ordered   09/09/21 1103  For home use only DME Other see comment  Once       Comments: Slide Board  Question:  Length of Need  Answer:  Lifetime   09/09/21 1103  Follow-up Information     [facility].   Specialty: [place] Why: PT/OT/Aide/RN will call to schedule your next visit.        [facility] Follow up.   Why: Slide Board for home use will be delivered Contact information: [address] [place] [address] [phone]                Allergies  Allergen Reactions   Latex Itching and Rash    cellulitis    Consultations: cardiology   Procedures/Studies: CT Angio Chest Pulmonary Embolism (PE) W or WO Contrast  Addendum Date: 09/02/2021   ADDENDUM REPORT: 09/02/2021 15:49 ADDENDUM: Following should be added to the impression. There is a 5 mm nodule in left lower lung field. No follow-up needed if patient is low-risk.This recommendation follows the consensus statement: Guidelines for Management of Incidental Pulmonary Nodules Detected on CT Images: From the Fleischner  Society 2017; Radiology 2017; 284:228-243. Electronically Signed   By: Elmer Picker M.D.   On: 09/02/2021 15:49   Result Date: 09/02/2021 CLINICAL DATA:  DVT lower extremity, positive D-dimer EXAM: CT ANGIOGRAPHY CHEST WITH CONTRAST TECHNIQUE: Multidetector CT imaging of the chest was performed using the standard protocol during bolus administration of intravenous contrast. Multiplanar CT image reconstructions and MIPs were obtained to evaluate the vascular anatomy. RADIATION DOSE REDUCTION: This exam was performed according to the departmental dose-optimization program which includes automated exposure control, adjustment of the mA and/or kV according to patient size and/or use of iterative reconstruction technique. CONTRAST:  134mL OMNIPAQUE IOHEXOL 350 MG/ML SOLN COMPARISON:  Previous studies including the chest radiograph done on 08/31/2021 FINDINGS: Cardiovascular: Heart is enlarged in size. The dilation of left ventricle. There is homogeneous enhancement in thoracic aorta. There are no intraluminal filling defects in central pulmonary artery branches. Evaluation of small peripheral branches in the lower lung fields is limited by infiltrates. Mediastinum/Nodes: Unremarkable. Lungs/Pleura: There are small linear patchy densities in both lower lung fields suggesting subsegmental atelectasis/pneumonia. In image 91 of series 6, 6 mm nodule is seen in left lower lobe. Minimal bilateral pleural effusions are seen there is no pneumothorax. Upper Abdomen: There is fatty infiltration in liver. Musculoskeletal: No acute findings are seen. Review of the MIP images confirms the above findings. IMPRESSION: There is no evidence of central pulmonary artery embolism. Evaluation of small peripheral branches in the lower lung fields is limited by infiltrates. There is no evidence of thoracic aortic dissection. Small linear patchy densities in the lower lung fields suggest subsegmental atelectasis/pneumonia.  Cardiomegaly.  Fatty liver. Electronically Signed: By: Elmer Picker M.D. On: 09/02/2021 15:02   US Venous Img Lower Bilateral (DVT)  Result Date: 09/02/2021 CLINICAL DATA:  Short of breath for 4 days, bilateral lower extremity edema EXAM: BILATERAL LOWER EXTREMITY VENOUS DOPPLER ULTRASOUND TECHNIQUE: Gray-scale sonography with graded compression, as well as color Doppler and duplex ultrasound were performed to evaluate the lower extremity deep venous systems from the level of the common femoral vein and including the common femoral, femoral, profunda femoral, popliteal and calf veins including the posterior tibial, peroneal and gastrocnemius veins when visible. The superficial great saphenous vein was also interrogated. Spectral Doppler was utilized to evaluate flow at rest and with distal augmentation maneuvers in the common femoral, femoral and popliteal veins. COMPARISON:  None Available. FINDINGS: RIGHT LOWER EXTREMITY Common Femoral Vein: No evidence of thrombus. Normal compressibility, respiratory phasicity and response to augmentation. Saphenofemoral Junction: No evidence of  thrombus. Normal compressibility and flow on color Doppler imaging. Profunda Femoral Vein: No evidence of thrombus. Normal compressibility and flow on color Doppler imaging. Femoral Vein: No evidence of thrombus. Normal compressibility, respiratory phasicity and response to augmentation. Popliteal Vein: No evidence of thrombus. Normal compressibility, respiratory phasicity and response to augmentation. Calf Veins: No gross evidence of thrombus. Evaluation limited due to body habitus and edema. Superficial Great Saphenous Vein: No evidence of thrombus. Normal compressibility. Other Findings:  None. LEFT LOWER EXTREMITY Common Femoral Vein: No evidence of thrombus. Normal compressibility, respiratory phasicity and response to augmentation. Saphenofemoral Junction: There is a small adherent thrombus at the saphenofemoral junction  extending into the common femoral vein, which has an appearance most consistent with chronic DVT. No occlusive thrombus is identified. Profunda Femoral Vein: No evidence of thrombus. Normal compressibility and flow on color Doppler imaging. Femoral Vein: No evidence of thrombus. Normal compressibility, respiratory phasicity and response to augmentation. Popliteal Vein: Occlusive thrombus within the left popliteal vein. The vessel is noncompressible, with no color flow identified. Calf Veins: Limited evaluation due to body habitus and subcutaneous edema. Superficial Great Saphenous Vein: No evidence of thrombus. Normal compressibility. Other Findings:  None. IMPRESSION: 1. Occlusive acute deep venous thrombosis of the left popliteal vein. 2. Suspected chronic nonocclusive thrombus within the left common femoral vein at the saphenofemoral junction. 3. No evidence of deep venous thrombosis within the right lower extremity. 4. Limited evaluation of the calf vessels due to body habitus and subcutaneous edema. These results will be called to the ordering clinician or representative by the Radiologist Assistant, and communication documented in the PACS or [employer]. Electronically Signed   By: Randa Ngo M.D.   On: 09/02/2021 12:32   ECHOCARDIOGRAM COMPLETE  Result Date: 09/01/2021    ECHOCARDIOGRAM REPORT   Patient Name:   TAKUMA CANNING Date of Exam: 09/01/2021 Medical Rec #:  [id number]     Height:       71.0 in Accession #:    [id number]    Weight:       498.0 lb Date of Birth:  Mar 29, 1970      BSA:          3.107 m Patient Age:    1 years      BP:           110/81 mmHg Patient Gender: M             HR:           85 bpm. Exam Location:  [facility] Procedure: 2D Echo, Color Doppler, Cardiac Doppler and Intracardiac            Opacification Agent Indications:    I50.9* Heart failure (unspecified)  History:        Patient has prior history of Echocardiogram examinations, most                 recent 03/06/2020.  Risk Factors:Hypertension, Dyslipidemia and                 Sleep Apnea.  Sonographer:    Raquel Sarna Senior RDCS Referring Phys: [phone] JACOB J STINSON  Sonographer Comments: Technically difficult study due to poor echo windows. Technically difficult study with very poor echo windows due to morbid obesity. IMPRESSIONS  1. Very technically difficult study with limited valve evaluation, Definity contrast given.  2. Left ventricular ejection fraction, by estimation, is 20 to 25%. Left ventricular ejection fraction by 2D MOD biplane is 25.0 %. The  left ventricle has severely decreased function. The left ventricle demonstrates global hypokinesis. The left ventricular internal cavity size was moderately dilated. Left ventricular diastolic parameters are consistent with Grade I diastolic dysfunction (impaired relaxation).  3. Right ventricular systolic function was not well visualized. The right ventricular size is not well visualized.  4. The mitral valve was not well visualized. No evidence of mitral valve regurgitation.  5. The aortic valve was not well visualized. Aortic valve regurgitation is not visualized.  6. The inferior vena cava is dilated in size with <50% respiratory variability, suggesting right atrial pressure of 15 mmHg. Comparison(s): Changes from prior study are noted. 03/06/2020: LVEF 30-40%. FINDINGS  Left Ventricle: Left ventricular ejection fraction, by estimation, is 20 to 25%. Left ventricular ejection fraction by 2D MOD biplane is 25.0 %. The left ventricle has severely decreased function. The left ventricle demonstrates global hypokinesis. Definity contrast agent was given IV to delineate the left ventricular endocardial borders. The left ventricular internal cavity size was moderately dilated. Suboptimal image quality limits for assessment of left ventricular hypertrophy. Left ventricular  diastolic parameters are consistent with Grade I diastolic dysfunction (impaired relaxation). Indeterminate filling  pressures. Right Ventricle: The right ventricular size is not well visualized. Right vetricular wall thickness was not well visualized. Right ventricular systolic function was not well visualized. Left Atrium: Left atrial size was normal in size. Right Atrium: Right atrial size was normal in size. Pericardium: There is no evidence of pericardial effusion. Mitral Valve: The mitral valve was not well visualized. No evidence of mitral valve regurgitation. Tricuspid Valve: The tricuspid valve is not well visualized. Tricuspid valve regurgitation is not demonstrated. Aortic Valve: The aortic valve was not well visualized. Aortic valve regurgitation is not visualized. Pulmonic Valve: The pulmonic valve was not well visualized. Pulmonic valve regurgitation is not visualized. Aorta: The aortic root and ascending aorta are structurally normal, with no evidence of dilitation. Venous: The inferior vena cava is dilated in size with less than 50% respiratory variability, suggesting right atrial pressure of 15 mmHg. IAS/Shunts: No atrial level shunt detected by color flow Doppler.  LEFT VENTRICLE PLAX 2D                        Biplane EF (MOD) LVOT diam:     2.30 cm         LV Biplane EF:   Left LV SV:         72                               ventricular LV SV Index:   23                               ejection LVOT Area:     4.15 cm                         fraction by                                                 2D MOD  biplane is LV Volumes (MOD)                                25.0 %. LV vol d, MOD    192.0 ml A2C:                           Diastology LV vol d, MOD    237.0 ml      LV e' medial:    5.11 cm/s A4C:                           LV E/e' medial:  12.7 LV vol s, MOD    142.0 ml      LV e' lateral:   5.11 cm/s A2C:                           LV E/e' lateral: 12.7 LV vol s, MOD    186.0 ml A4C: LV SV MOD A2C:   50.0 ml LV SV MOD A4C:   237.0 ml LV SV MOD BP:    56.7 ml  RIGHT VENTRICLE RV S prime:     12.60 cm/s TAPSE (M-mode): 2.3 cm LEFT ATRIUM             Index        RIGHT ATRIUM           Index LA Vol (A2C):   85.8 ml 27.61 ml/m  RA Area:     22.80 cm LA Vol (A4C):   70.2 ml 22.59 ml/m  RA Volume:   73.80 ml  23.75 ml/m LA Biplane Vol: 80.7 ml 25.97 ml/m  AORTIC VALVE LVOT Vmax:   106.00 cm/s LVOT Vmean:  66.200 cm/s LVOT VTI:    0.174 m  AORTA Ao Root diam: 3.40 cm MITRAL VALVE MV Area (PHT): 3.65 cm    SHUNTS MV Decel Time: 208 msec    Systemic VTI:  0.17 m MV E velocity: 65.10 cm/s  Systemic Diam: 2.30 cm MV A velocity: 84.40 cm/s MV E/A ratio:  0.77 Lyman Bishop MD Electronically signed by Lyman Bishop MD Signature Date/Time: 09/01/2021/12:30:05 PM    Final    DG Chest Portable 1 View  Result Date: 08/31/2021 CLINICAL DATA:  Shortness of breath EXAM: PORTABLE CHEST 1 VIEW COMPARISON:  11/12/2020 FINDINGS: Cardiomegaly and congestion of central vessels. There is no edema, consolidation, effusion, or pneumothorax. ACDF hardware IMPRESSION: Cardiomegaly and central vascular congestion. Electronically Signed   By: Jorje Guild M.D.   On: 08/31/2021 11:48      Subjective: No shortness of breath or chest pain.   Discharge Exam: Vitals:   09/09/21 2033 09/10/21 0433 09/10/21 0522 09/10/21 0713  BP: 97/83 (!) 88/43 105/85   Pulse: 78 68 73   Resp:  14    Temp: 98.2 F (36.8 C) 98.4 F (36.9 C)    TempSrc: Oral Oral    SpO2: 95% 91%  94%  Weight:  (!) 184.2 kg    Height:        General: Pt is alert, awake, not in acute distress Cardiovascular: RRR, S1/S2 +, no rubs, no gallops Respiratory: CTA bilaterally, no wheezing, no rhonchi Abdominal: Soft, NT, ND, bowel sounds + Extremities: 1+ edema, no cyanosis    The results of significant diagnostics from this hospitalization (including imaging,  microbiology, ancillary and laboratory) are listed below for reference.     Microbiology: Recent Results (from the past 240 hour(s))  Resp Panel  by RT-PCR (Flu A&B, Covid) Anterior Nasal Swab     Status: None   Collection Time: 08/31/21 12:10 PM   Specimen: Anterior Nasal Swab  Result Value Ref Range Status   SARS Coronavirus 2 by RT PCR NEGATIVE NEGATIVE Final    Comment: (NOTE) SARS-CoV-2 target nucleic acids are NOT DETECTED.  The SARS-CoV-2 RNA is generally detectable in upper respiratory specimens during the acute phase of infection. The lowest concentration of SARS-CoV-2 viral copies this assay can detect is 138 copies/mL. A negative result does not preclude SARS-Cov-2 infection and should not be used as the sole basis for treatment or other patient management decisions. A negative result may occur with  improper specimen collection/handling, submission of specimen other than nasopharyngeal swab, presence of viral mutation(s) within the areas targeted by this assay, and inadequate number of viral copies(<138 copies/mL). A negative result must be combined with clinical observations, patient history, and epidemiological information. The expected result is Negative.  Fact Sheet for Patients:  EntrepreneurPulse.com.au  Fact Sheet for Healthcare Providers:  IncredibleEmployment.be  This test is no t yet approved or cleared by the [place] FDA and  has been authorized for detection and/or diagnosis of SARS-CoV-2 by FDA under an Emergency Use Authorization (EUA). This EUA will remain  in effect (meaning this test can be used) for the duration of the COVID-19 declaration under Section 564(b)(1) of the Act, 21 U.S.C.section 360bbb-3(b)(1), unless the authorization is terminated  or revoked sooner.       Influenza A by PCR NEGATIVE NEGATIVE Final   Influenza B by PCR NEGATIVE NEGATIVE Final    Comment: (NOTE) The Xpert Xpress SARS-CoV-2/FLU/RSV plus assay is intended as an aid in the diagnosis of influenza from Nasopharyngeal swab specimens and should not be used as a sole basis  for treatment. Nasal washings and aspirates are unacceptable for Xpert Xpress SARS-CoV-2/FLU/RSV testing.  Fact Sheet for Patients: EntrepreneurPulse.com.au  Fact Sheet for Healthcare Providers: IncredibleEmployment.be  This test is not yet approved or cleared by the [place] FDA and has been authorized for detection and/or diagnosis of SARS-CoV-2 by FDA under an Emergency Use Authorization (EUA). This EUA will remain in effect (meaning this test can be used) for the duration of the COVID-19 declaration under Section 564(b)(1) of the Act, 21 U.S.C. section 360bbb-3(b)(1), unless the authorization is terminated or revoked.  Performed at [facility], [address] [place], [place] [address]      Labs: BNP (last 3 results) Recent Labs    11/12/20 1049 08/31/21 1144  BNP 75.0 123456*   Basic Metabolic Panel: Recent Labs  Lab 09/04/21 0254 09/05/21 0418 09/06/21 0319 09/06/21 0800 09/07/21 0444 09/08/21 0426 09/09/21 0505 09/10/21 0331  NA 136   < > 139  --  138 135 137 138  K 4.0   < > 3.8  --  4.4 4.5 4.3 3.9  CL 96*   < > 99  --  100 98 99 99  CO2 30   < > 32  --  31 30 30 31   GLUCOSE 118*   < > 113*  --  102* 104* 107* 101*  BUN 14   < > 18  --  20 19 20 19   CREATININE 1.01   < > 0.99  --  0.98 0.99 1.02 1.00  CALCIUM 8.6*   < >  9.0  --  8.9 8.8* 9.0 8.8*  MG 2.1  --   --  2.2  --   --   --  2.0   < > = values in this interval not displayed.   Liver Function Tests: No results for input(s): "AST", "ALT", "ALKPHOS", "BILITOT", "PROT", "ALBUMIN" in the last 168 hours. No results for input(s): "LIPASE", "AMYLASE" in the last 168 hours. No results for input(s): "AMMONIA" in the last 168 hours. CBC: No results for input(s): "WBC", "NEUTROABS", "HGB", "HCT", "MCV", "PLT" in the last 168 hours. Cardiac Enzymes: No results for input(s): "CKTOTAL", "CKMB", "CKMBINDEX", "TROPONINI" in the last 168 hours. BNP: Invalid input(s):  "POCBNP" CBG: No results for input(s): "GLUCAP" in the last 168 hours. D-Dimer No results for input(s): "DDIMER" in the last 72 hours. Hgb A1c No results for input(s): "HGBA1C" in the last 72 hours. Lipid Profile No results for input(s): "CHOL", "HDL", "LDLCALC", "TRIG", "CHOLHDL", "LDLDIRECT" in the last 72 hours. Thyroid function studies No results for input(s): "TSH", "T4TOTAL", "T3FREE", "THYROIDAB" in the last 72 hours.  Invalid input(s): "FREET3" Anemia work up No results for input(s): "VITAMINB12", "FOLATE", "FERRITIN", "TIBC", "IRON", "RETICCTPCT" in the last 72 hours. Urinalysis    Component Value Date/Time   COLORURINE YELLOW 06/04/2021 1324   APPEARANCEUR CLEAR 06/04/2021 1324   LABSPEC 1.016 06/04/2021 1324   PHURINE 7.0 06/04/2021 1324   GLUCOSEU NEGATIVE 06/04/2021 1324   HGBUR NEGATIVE 06/04/2021 1324   BILIRUBINUR NEGATIVE 06/04/2021 1324   BILIRUBINUR negative 08/26/2020 1558   BILIRUBINUR NEG 06/13/2015 1650   KETONESUR NEGATIVE 06/04/2021 1324   PROTEINUR NEGATIVE 06/04/2021 1324   UROBILINOGEN 0.2 08/26/2020 1558   UROBILINOGEN 1.0 09/26/2018 1619   NITRITE NEGATIVE 06/04/2021 1324   LEUKOCYTESUR NEGATIVE 06/04/2021 1324   Sepsis Labs No results for input(s): "WBC" in the last 168 hours.  Invalid input(s): "PROCALCITONIN", "LACTICIDVEN" Microbiology Recent Results (from the past 240 hour(s))  Resp Panel by RT-PCR (Flu A&B, Covid) Anterior Nasal Swab     Status: None   Collection Time: 08/31/21 12:10 PM   Specimen: Anterior Nasal Swab  Result Value Ref Range Status   SARS Coronavirus 2 by RT PCR NEGATIVE NEGATIVE Final    Comment: (NOTE) SARS-CoV-2 target nucleic acids are NOT DETECTED.  The SARS-CoV-2 RNA is generally detectable in upper respiratory specimens during the acute phase of infection. The lowest concentration of SARS-CoV-2 viral copies this assay can detect is 138 copies/mL. A negative result does not preclude SARS-Cov-2 infection and  should not be used as the sole basis for treatment or other patient management decisions. A negative result may occur with  improper specimen collection/handling, submission of specimen other than nasopharyngeal swab, presence of viral mutation(s) within the areas targeted by this assay, and inadequate number of viral copies(<138 copies/mL). A negative result must be combined with clinical observations, patient history, and epidemiological information. The expected result is Negative.  Fact Sheet for Patients:  BloggerCourse.com  Fact Sheet for Healthcare Providers:  SeriousBroker.it  This test is no t yet approved or cleared by the [place] FDA and  has been authorized for detection and/or diagnosis of SARS-CoV-2 by FDA under an Emergency Use Authorization (EUA). This EUA will remain  in effect (meaning this test can be used) for the duration of the COVID-19 declaration under Section 564(b)(1) of the Act, 21 U.S.C.section 360bbb-3(b)(1), unless the authorization is terminated  or revoked sooner.       Influenza A by PCR NEGATIVE NEGATIVE Final  Influenza B by PCR NEGATIVE NEGATIVE Final    Comment: (NOTE) The Xpert Xpress SARS-CoV-2/FLU/RSV plus assay is intended as an aid in the diagnosis of influenza from Nasopharyngeal swab specimens and should not be used as a sole basis for treatment. Nasal washings and aspirates are unacceptable for Xpert Xpress SARS-CoV-2/FLU/RSV testing.  Fact Sheet for Patients: EntrepreneurPulse.com.au  Fact Sheet for Healthcare Providers: IncredibleEmployment.be  This test is not yet approved or cleared by the [place] FDA and has been authorized for detection and/or diagnosis of SARS-CoV-2 by FDA under an Emergency Use Authorization (EUA). This EUA will remain in effect (meaning this test can be used) for the duration of the COVID-19 declaration  under Section 564(b)(1) of the Act, 21 U.S.C. section 360bbb-3(b)(1), unless the authorization is terminated or revoked.  Performed at [facility], [address] [place], [place] [address]      Time coordinating discharge: 47mins  SIGNED:   Kathie Dike, MD  [facility] 09/10/2021, 10:21 AM   If 7PM-7AM, please contact night-coverage [email or web address]

## 2021-09-10 NOTE — Care Management Important Message (Signed)
Important Message  Patient Details  Name: Jordan Jensen MRN: [id number] Date of Birth: Oct 15, 1970   Medicare Important Message Given:  Yes   Late entry, copy provided at 10:30am  Corey Harold 09/10/2021, 3:11 PM

## 2021-09-10 NOTE — Progress Notes (Addendum)
Progress Note  Patient Name: Jordan Jensen Date of Encounter: 09/10/2021  Primary Cardiologist: Donato Schultz, MD   Subjective   Over the weekend was transitioned to Torsemide. Patient has had limited GDMT in the setting of hypotension: he has been unable to tolerate entresto; or uptitration of his succinate. Notes he is back to baseline.  He is eager to get home. We have re-reviewed his history: sister has a heart condition NOS, mother had PE, father died of MI.  No family history of SCD, skeletal myopathies, HF.  Inpatient Medications    Scheduled Meds:  apixaban  5 mg Oral BID   fluticasone furoate-vilanterol  1 puff Inhalation Daily   metoprolol succinate  25 mg Oral Daily   potassium chloride  40 mEq Oral BID   rosuvastatin  20 mg Oral Daily   torsemide  40 mg Oral BID   Continuous Infusions:  PRN Meds: albuterol, diclofenac Sodium, ondansetron **OR** ondansetron (ZOFRAN) IV, sodium chloride   Vital Signs    Vitals:   09/09/21 2033 09/10/21 0433 09/10/21 0522 09/10/21 0713  BP: 97/83 (!) 88/43 105/85   Pulse: 78 68 73   Resp:  14    Temp: 98.2 F (36.8 C) 98.4 F (36.9 C)    TempSrc: Oral Oral    SpO2: 95% 91%  94%  Weight:  (!) 184.2 kg    Height:        Intake/Output Summary (Last 24 hours) at 09/10/2021 0831 Last data filed at 09/10/2021 0448 Gross per 24 hour  Intake 1080 ml  Output 2200 ml  Net -1120 ml   Filed Weights   09/08/21 0500 09/09/21 0500 09/10/21 0433  Weight: (!) 184.6 kg (!) 184.2 kg (!) 184.2 kg    Telemetry    SR with frequent PVCs - Personally Reviewed  Physical Exam   Gen: no distress, Morbid Obesity Neck: No JVD, thick neck Cardiac: No Rubs or Gallops, no Murmur, IRIR +2 radial pulses Respiratory: Clear to auscultation bilaterally, normal effort, normal  respiratory rate GI: Soft, nontender, non-distended  MS: bilateral  edema;  moves all extremities Integument: Skin feels warm Neuro:  At time of evaluation, alert and  oriented to person/place/time/situation  Psych: Normal affect, patient feels well   Labs    Chemistry Recent Labs  Lab 09/08/21 0426 09/09/21 0505 09/10/21 0331  NA 135 137 138  K 4.5 4.3 3.9  CL 98 99 99  CO2 30 30 31   GLUCOSE 104* 107* 101*  BUN 19 20 19   CREATININE 0.99 1.02 1.00  CALCIUM 8.8* 9.0 8.8*  GFRNONAA >60 >60 >60  ANIONGAP 7 8 8      HematologyNo results for input(s): "WBC", "RBC", "HGB", "HCT", "MCV", "MCH", "MCHC", "RDW", "PLT" in the last 168 hours.  Cardiac EnzymesNo results for input(s): "TROPONINI" in the last 168 hours. No results for input(s): "TROPIPOC" in the last 168 hours.   BNPNo results for input(s): "BNP", "PROBNP" in the last 168 hours.   DDimer No results for input(s): "DDIMER" in the last 168 hours.   Radiology    No results found.  Patient Profile     51 y.o. male with decompensated HF  Assessment & Plan    Heart Failure Reduced Ejection Fraction  NSVT with diuresis (on BB) Frequent PVCs (improved over course; he has no family history of SCD; he would not fit a Life-Vest) OSA now on CPAP Morbid Obesity - NYHA class IV, Stage C-D, hypervolemic, etiology is unclear - Diuretic  regimen: torsemide 40 mg PO BID  - Discussed the importance of fluid restriction of < 2 L, salt restriction, and checking daily weights, discussed weight components -  Replace electrolytes PRN and keep K>4 and Mg>2. Given K today, add on Mg for today  - metoprolol succinate 25 mg with little room to titrate  - ARNI/ARB/ACEi deferred for BP - SGLT2i to be started today; last UTI was 7 months ago, did teaching on UTI prevention - aldactone start as outpatient  - CMR would be reasonable as outpatient but I am not clear if he would fit the bore size - outpatient MPI may be reasonable - Query of familial component: no significant familial component - denied significant alcohol use  -Transferring Sat %/Ferritin deferred (normal Hgb, improed sx with  decongestion) - HIV negative  Patient is nearing discharge (I suspect today or tomorrow but will collaborate with primary) will arrange f/u visit, if here tomorrow will get follow up BMP  HLD - on rosuvastatin    For questions or updates, please contact [facility] Please consult [email or web address] for contact info under Cardiology/STEMI.      Riley Lam, MD FASE Cardiologist [facility]  [address] [place], [address] [place], [place] [address] [phone]  8:31 AM

## 2021-09-10 NOTE — Progress Notes (Signed)
Ng Discharge Note  Admit Date:  08/31/2021 Discharge date: 09/10/2021   Jordan Jensen to be D/C'd Home per MD order.  AVS completed. Patient/caregiver able to verbalize understanding.  Discharge Medication: Allergies as of 09/10/2021       Reactions   Latex Itching, Rash   cellulitis        Medication List     STOP taking these medications    budesonide-formoterol 80-4.5 MCG/ACT inhaler Commonly known as: Symbicort Replaced by: budesonide-formoterol 160-4.5 MCG/ACT inhaler   Entresto 24-26 MG Generic drug: sacubitril-valsartan   furosemide 40 MG tablet Commonly known as: LASIX       TAKE these medications    acetaminophen 500 MG tablet Commonly known as: TYLENOL Take 500 mg by mouth every 6 (six) hours as needed for moderate pain.   acetaminophen 325 MG tablet Commonly known as: TYLENOL Take 2 tablets (650 mg total) by mouth every 6 (six) hours as needed for mild pain (or Fever >/= 101).   albuterol 108 (90 Base) MCG/ACT inhaler Commonly known as: Ventolin HFA Inhale 2 puffs into the lungs every 6 (six) hours as needed for wheezing or shortness of breath.   albuterol (2.5 MG/3ML) 0.083% nebulizer solution Commonly known as: PROVENTIL Take 3 mLs (2.5 mg total) by nebulization every 6 (six) hours as needed for wheezing or shortness of breath.   apixaban 5 MG Tabs tablet Commonly known as: ELIQUIS Take 1 tablet (5 mg total) by mouth 2 (two) times daily.   budesonide-formoterol 160-4.5 MCG/ACT inhaler Commonly known as: Symbicort Inhale 2 puffs into the lungs in the morning and at bedtime. Brush tongue and rinse mouth afterwards Replaces: budesonide-formoterol 80-4.5 MCG/ACT inhaler   cetirizine 10 MG tablet Commonly known as: ZYRTEC Take 10 mg by mouth daily as needed for allergies.   diclofenac Sodium 1 % Gel Commonly known as: Voltaren Apply 2 g topically 4 (four) times daily. What changed:  when to take this reasons to take this   empagliflozin 10  MG Tabs tablet Commonly known as: JARDIANCE Take 1 tablet (10 mg total) by mouth daily. Start taking on: September 11, 2021   fluticasone 50 MCG/ACT nasal spray Commonly known as: FLONASE Place 2 sprays into both nostrils daily.   metoprolol succinate 25 MG 24 hr tablet Commonly known as: Toprol XL Take 1 tablet (25 mg total) by mouth daily.   MULTIVITAMIN ADULTS PO Take 1 tablet by mouth daily.   potassium chloride SA 20 MEQ tablet Commonly known as: KLOR-CON M Take 1 tablet (20 mEq total) by mouth 2 (two) times daily.   rosuvastatin 20 MG tablet Commonly known as: Crestor Take 1 tablet (20 mg total) by mouth daily.   Torsemide 40 MG Tabs Take 40 mg by mouth 2 (two) times daily.   VITAMIN B 12 PO Take 1 tablet by mouth daily.               Durable Medical Equipment  (From admission, onward)           Start     Ordered   09/09/21 1103  For home use only DME Other see comment  Once       Comments: Slide Board  Question:  Length of Need  Answer:  Lifetime   09/09/21 1103            Discharge Assessment: Vitals:   09/10/21 0713 09/10/21 1323  BP:  93/72  Pulse:  77  Resp:  (!) 21  Temp:  97.9 F (36.6 C)  SpO2: 94% 98%   Skin clean, dry and intact without evidence of skin break down, no evidence of skin tears noted. IV catheter discontinued intact. Site without signs and symptoms of complications - no redness or edema noted at insertion site, patient denies c/o pain - only slight tenderness at site.  Dressing with slight pressure applied.  D/c Instructions-Education: Discharge instructions given to patient/family with verbalized understanding. D/c education completed with patient/family including follow up instructions, medication list, d/c activities limitations if indicated, with other d/c instructions as indicated by MD - patient able to verbalize understanding, all questions fully answered. Patient instructed to return to ED, call 911, or call  MD for any changes in condition.  Patient escorted via WC, and D/C home via private auto.  Cristal Ford, LPN 07/11/1948 9:32 PM

## 2021-09-11 ENCOUNTER — Telehealth: Payer: Self-pay | Admitting: *Deleted

## 2021-09-11 ENCOUNTER — Telehealth: Payer: Self-pay | Admitting: Student

## 2021-09-11 DIAGNOSIS — G8250 Quadriplegia, unspecified: Secondary | ICD-10-CM | POA: Diagnosis not present

## 2021-09-11 DIAGNOSIS — E7849 Other hyperlipidemia: Secondary | ICD-10-CM | POA: Diagnosis not present

## 2021-09-11 DIAGNOSIS — S14105S Unspecified injury at C5 level of cervical spinal cord, sequela: Secondary | ICD-10-CM | POA: Diagnosis not present

## 2021-09-11 DIAGNOSIS — Z993 Dependence on wheelchair: Secondary | ICD-10-CM | POA: Diagnosis not present

## 2021-09-11 DIAGNOSIS — I509 Heart failure, unspecified: Secondary | ICD-10-CM | POA: Diagnosis not present

## 2021-09-11 DIAGNOSIS — I110 Hypertensive heart disease with heart failure: Secondary | ICD-10-CM | POA: Diagnosis not present

## 2021-09-11 MED ORDER — TORSEMIDE 20 MG PO TABS: 40.0000 mg | ORAL_TABLET | Freq: Two times a day (BID) | ORAL | 3 refills | Status: DC

## 2021-09-11 NOTE — Telephone Encounter (Signed)
I spoke with pharmacist at [facility].They will provide torsemide 20 mg (generic), pt will take 2 tablets BID  Patient made aware rx will be ready in an hour.

## 2021-09-11 NOTE — Patient Outreach (Signed)
  Care Coordination   Initial Visit Note   09/11/2021 Name: Jordan Jensen MRN: [id number] DOB: [birth year]  Jordan Jensen is a 51 y.o. year old male who sees Paseda, Baird Kay, FNP for primary care. I spoke with  [name] by phone today.  What matters to the patients health and wellness today?  Improving CHF, mobility, lose weight, and overall health and wellness. Assistance with getting a home aide through [facility].    Goals Addressed             This Visit's Progress    Patient Stated       Develop plan of care to improve CHF, mobility, weight loss, and overall health and wellness.  Assistance with getting a home aide through [place].        SDOH assessments and interventions completed:  Yes     Care Coordination Interventions Activated:  Yes  Care Coordination Interventions:  Yes, provided   Follow up plan: Follow up call scheduled for 09/18/21 with care coordinator and 09/19/21 with LCSW    Encounter Outcome:  Pt. Visit Completed   Blanchie Serve RN, BSN [facility] Care Management [facility] [phone] [email or web address]

## 2021-09-11 NOTE — Telephone Encounter (Signed)
Pt c/o medication issue:  1. Name of Medication:   torsemide 40 MG TABS    2. How are you currently taking this medication (dosage and times per day)? Take 40 mg by mouth 2 (two) times daily  3. Are you having a reaction (difficulty breathing--STAT)? No  4. What is your medication issue? Spouse states that pt was prescribed medication while in the hospital. She states that both pharmacy's that they use is out of the medication. Pt will miss his doses for today. She would like to know if another medication can be prescribed or are they able to get samples

## 2021-09-11 NOTE — Patient Outreach (Signed)
  Care Coordination [facility] Note Transition Care Management Follow-up Telephone Call Date of discharge and from where: 09/10/21 [facility] How have you been since you were released from the hospital? Patient states he is feeling better Any questions or concerns? No  Items Reviewed: Did the pt receive and understand the discharge instructions provided? Yes  Medications obtained and verified? Yes  Other? No  Any new allergies since your discharge? No  Dietary orders reviewed? Yes Do you have support at home? Yes   Home Care and Equipment/Supplies: Were home health services ordered? yes If so, what is the name of the agency? [facility]  Has the agency set up a time to come to the patient's home? yes Were any new equipment or medical supplies ordered?  Yes: slide board What is the name of the medical supply agency? [employer] Were you able to get the supplies/equipment? yes Do you have any questions related to the use of the equipment or supplies? No  Functional Questionnaire: (I = Independent and D = Dependent) ADLs: D  Bathing/Dressing- D  Meal Prep- D  Eating- I  Maintaining continence- I  Transferring/Ambulation- D  Managing Meds- I  Follow up appointments reviewed:  PCP Hospital f/u appt confirmed? No  nurse will inform Steffainie Cairrikier [facility] f/u appt confirmed? No   Are transportation arrangements needed? No  If their condition worsens, is the pt aware to call PCP or go to the Emergency Dept.? Yes Was the patient provided with contact information for the PCP's office or ED? Yes Was to pt encouraged to call back with questions or concerns? Yes  SDOH assessments and interventions completed:   No  Care Coordination Interventions Activated:  Yes   Care Coordination Interventions:  PCP follow up appointment requested    Encounter Outcome:  Pt. Visit Completed    Blanchie Serve RN, BSN [facility] Care Management [facility] [phone] [email or web address]

## 2021-09-12 ENCOUNTER — Telehealth: Payer: Self-pay | Admitting: Nurse Practitioner

## 2021-09-12 NOTE — Telephone Encounter (Signed)
Please advise 

## 2021-09-12 NOTE — Telephone Encounter (Signed)
Called Jordan Jensen no answer left vm

## 2021-09-12 NOTE — Telephone Encounter (Signed)
Ria Comment, [facility], [phone]   Called stating he is needing verbal orders for his plan of care to approved.    2x a week for 4 wks, 1x a week for 1 wk

## 2021-09-13 DIAGNOSIS — I110 Hypertensive heart disease with heart failure: Secondary | ICD-10-CM | POA: Diagnosis not present

## 2021-09-13 DIAGNOSIS — G8250 Quadriplegia, unspecified: Secondary | ICD-10-CM | POA: Diagnosis not present

## 2021-09-13 DIAGNOSIS — Z993 Dependence on wheelchair: Secondary | ICD-10-CM | POA: Diagnosis not present

## 2021-09-13 DIAGNOSIS — E7849 Other hyperlipidemia: Secondary | ICD-10-CM | POA: Diagnosis not present

## 2021-09-13 DIAGNOSIS — I509 Heart failure, unspecified: Secondary | ICD-10-CM | POA: Diagnosis not present

## 2021-09-13 DIAGNOSIS — S14105S Unspecified injury at C5 level of cervical spinal cord, sequela: Secondary | ICD-10-CM | POA: Diagnosis not present

## 2021-09-13 NOTE — Telephone Encounter (Signed)
Called pt no answer left vm 

## 2021-09-16 ENCOUNTER — Encounter: Payer: Medicare Other | Admitting: *Deleted

## 2021-09-16 DIAGNOSIS — K6289 Other specified diseases of anus and rectum: Secondary | ICD-10-CM | POA: Insufficient documentation

## 2021-09-16 DIAGNOSIS — Z86010 Personal history of colonic polyps: Secondary | ICD-10-CM | POA: Diagnosis not present

## 2021-09-16 DIAGNOSIS — Z8719 Personal history of other diseases of the digestive system: Secondary | ICD-10-CM | POA: Insufficient documentation

## 2021-09-16 DIAGNOSIS — Z993 Dependence on wheelchair: Secondary | ICD-10-CM | POA: Diagnosis not present

## 2021-09-16 DIAGNOSIS — I5020 Unspecified systolic (congestive) heart failure: Secondary | ICD-10-CM | POA: Insufficient documentation

## 2021-09-17 ENCOUNTER — Telehealth: Payer: Self-pay | Admitting: Cardiology

## 2021-09-17 DIAGNOSIS — E7849 Other hyperlipidemia: Secondary | ICD-10-CM | POA: Diagnosis not present

## 2021-09-17 DIAGNOSIS — S14105S Unspecified injury at C5 level of cervical spinal cord, sequela: Secondary | ICD-10-CM | POA: Diagnosis not present

## 2021-09-17 DIAGNOSIS — Z993 Dependence on wheelchair: Secondary | ICD-10-CM | POA: Diagnosis not present

## 2021-09-17 DIAGNOSIS — I110 Hypertensive heart disease with heart failure: Secondary | ICD-10-CM | POA: Diagnosis not present

## 2021-09-17 DIAGNOSIS — G8250 Quadriplegia, unspecified: Secondary | ICD-10-CM | POA: Diagnosis not present

## 2021-09-17 DIAGNOSIS — I509 Heart failure, unspecified: Secondary | ICD-10-CM | POA: Diagnosis not present

## 2021-09-17 NOTE — Telephone Encounter (Signed)
   Pre-operative Risk Assessment    Patient Name: Jordan Jensen  DOB: 29-Jul-1970 MRN: [id number]      Request for Surgical Clearance    Procedure:   EUA to repair Peri Anal Fistula  Date of Surgery:  Clearance TBD                                 Surgeon:  Dr. Marin Olp Surgeon's Group or Practice Name:  [facility] Phone number:  [phone] Fax number:  [phone]   Type of Clearance Requested:   - Medical    Type of Anesthesia:  General    Additional requests/questions:    SignedSeymour Bars   09/17/2021, 12:51 PM

## 2021-09-17 NOTE — Telephone Encounter (Signed)
No request to hold Eliquis, likely an oversight. Will route to Pharm D then plan to schedule virtual visit.

## 2021-09-18 ENCOUNTER — Ambulatory Visit: Payer: Self-pay | Admitting: *Deleted

## 2021-09-18 ENCOUNTER — Encounter: Payer: Self-pay | Admitting: *Deleted

## 2021-09-18 NOTE — Patient Outreach (Signed)
Care Coordination   Initial Visit Note   09/18/2021 Name: Jordan Jensen MRN: [id number] DOB: 11-May-1970  Jordan Jensen is a 51 y.o. year old male who sees Paseda, Baird Kay, FNP for primary care. I spoke with  [name] by phone today.  What matters to the patients health and wellness today?  Resolving anal perirectal fistula -need reports signs of healing, "closing up" and only having symptoms when have a large stool  Heart failure- unable to stand for long periods of time using crutches to obtain a weight Last weight done a MD office. He reports without a scale with handles at home he monitors the fluid gain by assessing the swelling of his legs and feet Need a scale (with handles) for daily weights. He is on a fluid restriction and takes in 6 12 ounce cups of fluids/day  Continues with home health therapies- works with therapist on various painful joints    Side effect of Jardiance to include light headed, nervousness, Dry skin,  Confusion . He discontinued Jardiance. Voiced concern of being ordered a medication for diabetes when he does not have a diagnosis. [name] was noted to be started during his 8/27-09/10/21 admission    Goals Addressed               This Visit's Progress     Patient Stated     assistance with getting personal care services [facility]) (pt-stated)   Not on track     Care Coordination Interventions: Evaluation of current treatment plan related to the need of personal care services and patient's adherence to plan as established by provider Collaborated with pcp and Sw as needed  regarding needs for personal care services  Discussed services with patient Noted he is scheduled to have an outreach from [facility] on 09/19/21- will discuss case with SW       Healing of perirectal fistula/wound ([facility]) (pt-stated)   Not on track     Care Coordination Interventions: Evaluation of current treatment plan related to perianal fistula/wound and patient's adherence to plan  as established by provider Collaborated with pcp as needed regarding wound healing Screening for signs and symptoms of depression related to chronic disease state  Assessed social determinant of health barriers Assessed the status of the wound       Manage Congestive heart failure at home [facility]) (pt-stated)   Not on track     Care Coordination Interventions: Reviewed Heart Failure Action Plan in depth and provided written copy Assessed need for readable accurate scales in home Reviewed role of diuretics in prevention of fluid overload and management of heart failure; Screening for signs and symptoms of depression related to chronic disease state  Assessed social determinant of health barriers  Acknowledged his difficulty with standing for long periods on regular scales, need for scales with handles for safety  Provided education on heart failure action plan, Heart failure eating plan & living with heart failure via my chart         Medication management -side effects  ([facility]) (pt-stated)   Not on track     Care Coordination Interventions: Evaluation of current treatment plan related to discontinuation of Jardiance & side effects and patient's adherence to plan as established by provider Collaborated with pcp via [facility] secure chat  regarding discontinuation of Jardiance and side effects  Discussed plans with patient for ongoing care management follow up and provided patient with direct contact information for care management team Review the know  side effects with patient which includes dizziness, dehydration (dry skin) symptoms, joint pain, Discussed the use of Jardiance's side effects to assist with weight loss, treat heart failure & the removal of sugar in the kidneys Encouraged hydration         SDOH assessments and interventions completed:  Yes     Care Coordination Interventions Activated:  Yes  Care Coordination Interventions:  Yes, provided   Follow up plan: Follow up call  scheduled for 09/25/21 1200    Encounter Outcome:  Pt. Visit Completed   Cherril Hett L. Noelle Penner, RN, BSN, CCM [facility] Care Coordinator Office number [phone]

## 2021-09-18 NOTE — Telephone Encounter (Signed)
Patient with diagnosis of DVT on Eliquis for anticoagulation.    Procedure: EUA to repair peri anal fistula Date of procedure: TBD    Patient hospitalized on 8/26 for acute on chronic heart failure.  Was found to have DVT of left popliteal vein.  Has only been 2 weeks since diagnosis.    CrCl 91 (ideal body weight) Platelet count 213  He was previously seen by Dr. Purvis Sheffield in April 2021.  He has seen [place] PA, most recently in March of this year.     Will need MD approval - if surgery cannot wait until at least 3 months of anticoagulation (provoked clot), then will need to consider lovenox bridge vs heparin/hospitalization  **This guidance is not considered finalized until pre-operative APP has relayed final recommendations.**

## 2021-09-18 NOTE — Patient Instructions (Addendum)
Visit Information  Thank you for taking time to visit with me today. Please don't hesitate to contact me if I can be of assistance to you.   Following are the goals we discussed today:   Goals Addressed               This Visit's Progress     Patient Stated     assistance with getting personal care services [facility]) (pt-stated)   Not on track     Care Coordination Interventions: Evaluation of current treatment plan related to the need of personal care services and patient's adherence to plan as established by provider Collaborated with pcp and Sw as needed  regarding needs for personal care services  Discussed services with patient Noted he is scheduled to have an outreach from [facility] SW on 09/19/21- will discuss case with SW       Healing of perirectal fistula/wound (THN) (pt-stated)   Not on track     Care Coordination Interventions: Evaluation of current treatment plan related to perianal fistula/wound and patient's adherence to plan as established by provider Collaborated with pcp as needed regarding wound healing Screening for signs and symptoms of depression related to chronic disease state  Assessed social determinant of health barriers Assessed the status of the wound       Manage Congestive heart failure at home [facility]) (pt-stated)   Not on track     Care Coordination Interventions: Reviewed Heart Failure Action Plan in depth and provided written copy Assessed need for readable accurate scales in home Reviewed role of diuretics in prevention of fluid overload and management of heart failure; Screening for signs and symptoms of depression related to chronic disease state  Assessed social determinant of health barriers  Acknowledged his difficulty with standing for long periods on regular scales, need for scales with handles for safety  Provided education on heart failure action plan, Heart failure eating plan & living with heart failure via my chart         Medication  management -side effects  (THN) (pt-stated)   Not on track     Care Coordination Interventions: Evaluation of current treatment plan related to discontinuation of Jardiance & side effects and patient's adherence to plan as established by provider Collaborated with pcp via EPIC secure chat  regarding discontinuation of Jardiance and side effects  Discussed plans with patient for ongoing care management follow up and provided patient with direct contact information for care management team Review the know side effects with patient which includes dizziness, dehydration (dry skin) symptoms, joint pain, Discussed the use of Jardiance's side effects to assist with weight loss, treat heart failure & the removal of sugar in the kidneys Encouraged hydration         Our next appointment is by telephone on 9/23 at 3 pm   Please call the care guide team at [phone] if you need to cancel or reschedule your appointment.   If you are experiencing a Mental Health or Behavioral Health Crisis or need someone to talk to, please call the Suicide and Crisis Lifeline: 988 call the [place] National Suicide Prevention Lifeline: 315-476-3269 or TTY: 865 855 9546 TTY 250-548-5412) to talk to a trained counselor call 1-800-273-TALK (toll free, 24 hour hotline) call the [facility]: (619)649-8813 call 911   Patient verbalizes understanding of instructions and care plan provided today and agrees to view in MyChart. Active MyChart status and patient understanding of how to access instructions and care plan via MyChart  confirmed with patient.     The patient has been provided with contact information for the care management team and has been advised to call with any health related questions or concerns.   [place] Lavina Hamman, RN, BSN, [place] Coordinator Office number [phone]

## 2021-09-19 ENCOUNTER — Ambulatory Visit: Payer: Self-pay | Admitting: *Deleted

## 2021-09-19 DIAGNOSIS — I509 Heart failure, unspecified: Secondary | ICD-10-CM | POA: Diagnosis not present

## 2021-09-19 DIAGNOSIS — G8250 Quadriplegia, unspecified: Secondary | ICD-10-CM | POA: Diagnosis not present

## 2021-09-19 DIAGNOSIS — E7849 Other hyperlipidemia: Secondary | ICD-10-CM | POA: Diagnosis not present

## 2021-09-19 DIAGNOSIS — Z993 Dependence on wheelchair: Secondary | ICD-10-CM | POA: Diagnosis not present

## 2021-09-19 DIAGNOSIS — S14105S Unspecified injury at C5 level of cervical spinal cord, sequela: Secondary | ICD-10-CM | POA: Diagnosis not present

## 2021-09-19 DIAGNOSIS — I110 Hypertensive heart disease with heart failure: Secondary | ICD-10-CM | POA: Diagnosis not present

## 2021-09-19 NOTE — Telephone Encounter (Signed)
Will need to route to Dr. Anne Fu for question pharm team is asking below for input since Dr. Purvis Sheffield is no longer with our practice, whether he suggests cardiology input on the anticoagulation for recent new DVT versus recommending patient f/u with PCP for this. Dr. Anne Fu - - Please route response to P CV DIV PREOP (the pre-op pool). Thank you. You were the first cardiologist to consult on the patient in the hospital this most recent admission.  Otherwise would plan to recommend post-hospital f/u given recent CHF admission rather than virtual visit. Can send to callback once Dr. Anne Fu' reply received.

## 2021-09-19 NOTE — Patient Outreach (Signed)
  Care Coordination   09/19/2021  Name: Jordan Jensen MRN: [id number] DOB: 22-Aug-1970   Care Coordination Outreach Attempts:  An unsuccessful telephone outreach was attempted today to offer the patient information about available care coordination services as a benefit of their health plan. HIPAA compliant message left on voicemail, providing contact information for CSW, encouraging patient to return CSW's call at his earliest convenience.  Follow Up Plan:  Additional outreach attempts will be made to offer the patient care coordination information and services.   Encounter Outcome:  No Answer.   Care Coordination Interventions Activated:  No.    Care Coordination Interventions:  No, not indicated.    Danford Bad, BSW, MSW, LCSW  Licensed Restaurant manager, [employer] [facility]  Mailing [place], [place], [place] [address] Physical Address-[address], [place], [place] [address] Toll Free Main # [phone] Fax # [phone] Cell # [phone] [email or web address]

## 2021-09-20 ENCOUNTER — Telehealth: Payer: Self-pay

## 2021-09-20 DIAGNOSIS — G4733 Obstructive sleep apnea (adult) (pediatric): Secondary | ICD-10-CM | POA: Diagnosis not present

## 2021-09-20 DIAGNOSIS — J4540 Moderate persistent asthma, uncomplicated: Secondary | ICD-10-CM | POA: Diagnosis not present

## 2021-09-20 NOTE — Telephone Encounter (Signed)
Called and spoke with Joni Reining at [facility] surgery and reviewed recommendations from Dr. Anne Fu.  She will discuss with the surgical team and call back to let us know their plan in regards to either postponing procedure, or recommending that we proceed with either Lovenox bridge or heparin gtt/inpatient admission.

## 2021-09-20 NOTE — Telephone Encounter (Signed)
(  Routed to preop team below to review and finalize)

## 2021-09-20 NOTE — Chronic Care Management (AMB) (Signed)
  Care Coordination Note  09/20/2021 Name: MATTIS FEATHERLY MRN: [id number] DOB: 04/12/1970  Rochel Brome Say is a 51 y.o. year old male who is a primary care patient of Donell Beers, FNP and is actively engaged with the care management team. I reached out to [name] by phone today to assist with re-scheduling a follow up visit with the Licensed Clinical Social Worker  Follow up plan: Telephone appointment with care management team member scheduled for:09/23/2021  Penne Lash, [place] Care Guide [facility] [place], [place] [address] Direct Dial: [phone] [email or web address]

## 2021-09-20 NOTE — Chronic Care Management (AMB) (Signed)
  Care Coordination Note  09/20/2021 Name: Jordan Jensen MRN: [id number] DOB: 07-18-1970  Jordan Jensen is a 51 y.o. year old male who is a primary care patient of Donell Beers, FNP and is actively engaged with the care management team. I reached out to [name] by phone today to assist with re-scheduling a follow up visit with the Licensed Clinical Social Worker  Follow up plan: Unsuccessful telephone outreach attempt made. A HIPAA compliant phone message was left for the patient providing contact information and requesting a return call.  The care management team will reach out to the patient again over the next 7 days.  If patient returns call to provider office, please advise to call Care Management Care Guide Penne Lash  at [phone]  Penne Lash, RMA Care Guide [facility]  [place], [place] [address] Direct Dial: [phone] [email or web address]

## 2021-09-20 NOTE — Telephone Encounter (Signed)
Spoke with Jordan Jensen gave verbal

## 2021-09-21 DIAGNOSIS — I509 Heart failure, unspecified: Secondary | ICD-10-CM | POA: Diagnosis not present

## 2021-09-21 DIAGNOSIS — S14105S Unspecified injury at C5 level of cervical spinal cord, sequela: Secondary | ICD-10-CM | POA: Diagnosis not present

## 2021-09-21 DIAGNOSIS — I110 Hypertensive heart disease with heart failure: Secondary | ICD-10-CM | POA: Diagnosis not present

## 2021-09-21 DIAGNOSIS — Z993 Dependence on wheelchair: Secondary | ICD-10-CM | POA: Diagnosis not present

## 2021-09-21 DIAGNOSIS — E7849 Other hyperlipidemia: Secondary | ICD-10-CM | POA: Diagnosis not present

## 2021-09-21 DIAGNOSIS — G8250 Quadriplegia, unspecified: Secondary | ICD-10-CM | POA: Diagnosis not present

## 2021-09-23 ENCOUNTER — Ambulatory Visit: Payer: Self-pay | Admitting: *Deleted

## 2021-09-23 NOTE — Telephone Encounter (Signed)
Elmyra Ricks from [facility] surgery calling back. She said you can ask for anyone in triage

## 2021-09-24 ENCOUNTER — Ambulatory Visit (INDEPENDENT_AMBULATORY_CARE_PROVIDER_SITE_OTHER): Payer: Medicare Other | Admitting: Nurse Practitioner

## 2021-09-24 ENCOUNTER — Encounter: Payer: Self-pay | Admitting: Nurse Practitioner

## 2021-09-24 ENCOUNTER — Encounter: Payer: Self-pay | Admitting: *Deleted

## 2021-09-24 VITALS — BP 112/80 | HR 73 | Ht 71.0 in

## 2021-09-24 DIAGNOSIS — I5043 Acute on chronic combined systolic (congestive) and diastolic (congestive) heart failure: Secondary | ICD-10-CM

## 2021-09-24 DIAGNOSIS — K604 Rectal fistula, unspecified: Secondary | ICD-10-CM

## 2021-09-24 DIAGNOSIS — I509 Heart failure, unspecified: Secondary | ICD-10-CM | POA: Diagnosis not present

## 2021-09-24 DIAGNOSIS — Z993 Dependence on wheelchair: Secondary | ICD-10-CM | POA: Diagnosis not present

## 2021-09-24 DIAGNOSIS — Z23 Encounter for immunization: Secondary | ICD-10-CM

## 2021-09-24 DIAGNOSIS — I10 Essential (primary) hypertension: Secondary | ICD-10-CM | POA: Diagnosis not present

## 2021-09-24 DIAGNOSIS — I82432 Acute embolism and thrombosis of left popliteal vein: Secondary | ICD-10-CM | POA: Diagnosis not present

## 2021-09-24 DIAGNOSIS — I110 Hypertensive heart disease with heart failure: Secondary | ICD-10-CM | POA: Diagnosis not present

## 2021-09-24 DIAGNOSIS — Z09 Encounter for follow-up examination after completed treatment for conditions other than malignant neoplasm: Secondary | ICD-10-CM | POA: Diagnosis not present

## 2021-09-24 DIAGNOSIS — G8250 Quadriplegia, unspecified: Secondary | ICD-10-CM | POA: Diagnosis not present

## 2021-09-24 DIAGNOSIS — E7849 Other hyperlipidemia: Secondary | ICD-10-CM | POA: Diagnosis not present

## 2021-09-24 DIAGNOSIS — S14105S Unspecified injury at C5 level of cervical spinal cord, sequela: Secondary | ICD-10-CM | POA: Diagnosis not present

## 2021-09-24 NOTE — Assessment & Plan Note (Addendum)
Currently on Eliquis 5 mg twice daily Continue current medication. Check CBC  Continue PT at home, ambulate daily as tolerated to prevent reoccurrence of DVT

## 2021-09-24 NOTE — Patient Instructions (Signed)
Visit Information  Thank you for taking time to visit with me today. Please don't hesitate to contact me if I can be of assistance to you.   Following are the goals we discussed today:   Goals Addressed               This Visit's Progress     Receive Assistance with Completion of Personal Care Services Application through [employer]. (pt-stated)   On track     Care Coordination Interventions:  Discussed plans with patient for ongoing care management follow up. Provided patient with direct contact information for care management team. Screening for signs and symptoms of depression related to chronic disease state.  Assessed social determinant of health barriers. Active listening/reflection utilized.  Problem solving/task-centered strategies developed. Quality of sleep assessed and sleep hygiene techniques promoted.  Verbalization of feelings encouraged.  Discussed caregiver resources and support. Emailed the following list of agencies and resources on 09/24/2021:      [place] Prov[facility] Collaboration with Primary Care Provider, Dr. Vena Rua to request completion of [place] application. Collaboration with Primary Care Provider, Dr. Vena Rua to request nurse fax [phone] or # [phone]) completed [place] application to [employer] for processing. Encouraged to review Personal Care Services Providers, and be prepared to discuss during our next scheduled telephone outreach call.         Our next appointment is by telephone on 10/02/2021 at 11:30 am.  Please call the care guide team at [phone] if you need to cancel or reschedule your appointment.   If you are experiencing a Mental Health or [place] or need someone to talk to, please call the Suicide and Crisis Lifeline: 988 call  the [place] National Suicide Prevention Lifeline: 940-729-0369 or TTY: 831-357-2532 TTY 404-414-9282) to talk to a trained counselor call 1-800-273-TALK (toll free, 24 hour hotline) go to [facility] [address] [place] ([phone]) call the [place]: [phone] call 911  Patient verbalizes understanding of instructions and care plan provided today and agrees to view in [place]. Active MyChart status and patient understanding of how to access instructions and care plan via MyChart confirmed with patient.     Telephone follow up appointment with care management team member scheduled for:  10/02/2021 at 11:30 am.  Nat Christen, BSW, MSW, [place]  Licensed Clinical Social Worker  [place]  Mailing [place]. [address] [place], [place] [address] Physical Address-[address] [place], [place] [address] Toll Free Main # [phone] Fax # [phone] Cell # [phone] [name]

## 2021-09-24 NOTE — Assessment & Plan Note (Signed)
BP Readings from Last 3 Encounters:  09/24/21 112/80  09/10/21 93/72  08/06/21 (!) 115/97  Blood pressure well controlled Currently on metoprolol 25 mg daily, torsemide 40 mg twice daily Entresto was discontinued at  the hospital DASH diet advised patient encouraged to maintain close follow-up with cardiology
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
## 2021-09-24 NOTE — Assessment & Plan Note (Signed)
Does have upcoming appointment with general surgery at  [facility] planning for surgery but needs clearance from cardiology

## 2021-09-24 NOTE — Patient Instructions (Signed)
It is important that you exercise regularly at least 30 minutes 5 times a week as tolerated  Think about what you will eat, plan ahead. Choose " clean, green, fresh or frozen" over canned, processed or packaged foods which are more sugary, salty and fatty. 70 to 75% of food eaten should be vegetables and fruit. Three meals at set times with snacks allowed between meals, but they must be fruit or vegetables. Aim to eat over a 12 hour period , example 7 am to 7 pm, and STOP after  your last meal of the day. Drink water,generally about 64 ounces per day, no other drink is as healthy. Fruit juice is best enjoyed in a healthy way, by EATING the fruit.  Thanks for choosing [facility], we consider it a privelige to serve you.  

## 2021-09-24 NOTE — Patient Outreach (Signed)
  Care Coordination   Initial Visit Note   09/24/2021  Name: Jordan Jensen MRN: [id number] DOB: [birth year]  Jordan Jensen is a 51 y.o. year old male who sees [facility], Dewaine Conger, FNP for primary care. I spoke with Jordan Jensen by phone today.  What matters to the patients health and wellness today?  Receive Assistance with Completion of Personal Care Services Application through [employer].    Goals Addressed               This Visit's Progress     Receive Assistance with Completion of Personal Care Services Application through [employer]. (pt-stated)   On track     Care Coordination Interventions:  Discussed plans with patient for ongoing care management follow up. Provided patient with direct contact information for care management team. Screening for signs and symptoms of depression related to chronic disease state.  Assessed social determinant of health barriers. Active listening/reflection utilized.  Problem solving/task-centered strategies developed. Quality of sleep assessed and sleep hygiene techniques promoted.  Verbalization of feelings encouraged.  Discussed caregiver resources and support. Emailed the following list of agencies and resources on 09/24/2021:      [place] Prov[facility] Collaboration with Primary Care Provider, Dr. Vena Rua to request completion of [place] application. Collaboration with Primary Care Provider, Dr. Vena Rua to request nurse fax [phone] or # [phone]) completed [place] application to [employer] for processing. Encouraged to review Personal Care Services Providers, and be prepared to discuss during our next scheduled telephone outreach call.         SDOH assessments and interventions completed:  Yes.  SDOH Interventions Today     Flowsheet Row Most Recent Value  SDOH Interventions   Food Insecurity Interventions Intervention Not Indicated  Housing Interventions Intervention Not Indicated  Transportation Interventions Intervention Not Indicated  Utilities Interventions Intervention Not Indicated  Alcohol Usage Interventions Intervention Not Indicated (Score <7)  Financial Strain Interventions Intervention Not Indicated  Physical Activity Interventions Patient Refused  Stress Interventions Offered [employer] Resources, Provide Counseling, Patient Refused  Social Connections Interventions Intervention Not Indicated        Care Coordination Interventions Activated:  Yes.   Care Coordination Interventions:  Yes, provided.   Follow up plan: Follow up call scheduled for 10/02/2021 at 11:30 am.  Encounter Outcome:  Pt. Visit Completed.   Nat Christen, BSW, MSW, LCSW  Licensed Education officer, environmental [facility]  Mailing [place] [address] [place], [place] [address] Physical Address-[address], [place], [place] [address] Toll Free Main # [phone] Fax # [phone] Cell # [phone] [name]

## 2021-09-24 NOTE — Assessment & Plan Note (Addendum)
Patient was on admission from 09/01/2018 23-95 2023 for acute on chronic combined systolic and diastolic CHF, acute DVT of left lower extremity. Patient currently denies shortness of breath, edema is much better. After visit summary, labs and imaging reviewed by me today Check CBC, CMP

## 2021-09-24 NOTE — Assessment & Plan Note (Signed)
Patient educated on CDC recommendation for the influenza vaccine. Verbal consent was obtained from the patient, vaccine administered by nurse, no sign of adverse reactions noted at this time. Patient education on arm soreness and use of tylenol  for this patient  was discussed. Patient educated on the signs and symptoms of adverse effect and advise to contact the office if they occur. Vaccine information sheet given to patient.  

## 2021-09-24 NOTE — Assessment & Plan Note (Addendum)
Wt Readings from Last 3 Encounters:  09/18/21 (!) 406 lb (184.2 kg)  09/10/21 (!) 406 lb (184.2 kg)  08/06/21 (!) 470 lb (213.2 kg)  Entresto was stopped while in the hospital Currently on metoprolol 25 mg daily, torsemide 40mg  BID He has stopped taking Jardiance due to feeling weak and feeling dizzy, hypotension.  Continue metoprolol 25 mg daily, torsemide 40 mg twice daily follow-up with cardiology as planned.  Check CBC, CMP Benefit of jardiance discussed with the patient and his spouse  DASH diet advised.

## 2021-09-25 ENCOUNTER — Telehealth: Payer: Self-pay | Admitting: Nurse Practitioner

## 2021-09-25 ENCOUNTER — Ambulatory Visit: Payer: Self-pay | Admitting: *Deleted

## 2021-09-25 ENCOUNTER — Telehealth: Payer: Self-pay

## 2021-09-25 DIAGNOSIS — S14105S Unspecified injury at C5 level of cervical spinal cord, sequela: Secondary | ICD-10-CM | POA: Diagnosis not present

## 2021-09-25 DIAGNOSIS — E7849 Other hyperlipidemia: Secondary | ICD-10-CM | POA: Diagnosis not present

## 2021-09-25 DIAGNOSIS — I509 Heart failure, unspecified: Secondary | ICD-10-CM | POA: Diagnosis not present

## 2021-09-25 DIAGNOSIS — Z993 Dependence on wheelchair: Secondary | ICD-10-CM | POA: Diagnosis not present

## 2021-09-25 DIAGNOSIS — I110 Hypertensive heart disease with heart failure: Secondary | ICD-10-CM | POA: Diagnosis not present

## 2021-09-25 DIAGNOSIS — G8250 Quadriplegia, unspecified: Secondary | ICD-10-CM | POA: Diagnosis not present

## 2021-09-25 NOTE — Telephone Encounter (Signed)
PCS forms   Noted  Copied  Sleeved  Original in provider box Copy in brown folder

## 2021-09-25 NOTE — Telephone Encounter (Signed)
Pt called stating that he received a call. He wanted to see if it was in regards to the paperwork that was given this morning?

## 2021-09-25 NOTE — Patient Outreach (Signed)
  Care Coordination   Follow Up Visit Note   09/25/2021 Name: Jordan Jensen MRN: [id number] DOB: 02/01/1970  Jordan Jensen is a 51 y.o. year old male who sees Doren Custard, Hazle Nordmann, MD for primary care. I spoke with  [name] by phone today.  What matters to the patients health and wellness today?  Re starting jardiance after his recent pcp visit and monitoring for continued side effects, reporting side effects to RN CM/pcp staff to prevent re admission     Goals Addressed               This Visit's Progress     Patient Stated     assistance with getting personal care services [facility]) (pt-stated)   On track     Care Coordination Interventions: Collaborated with pcp and Sw as needed  regarding needs for personal care services  Discussed services with patient Confirmed outreach from [facility] SW on 09/19/21- will continue to discuss case with SW       Healing of perirectal fistula/wound ([facility]) (pt-stated)   On track     Care Coordination Interventions: Evaluation of current treatment plan related to perianal fistula/wound and patient's adherence to plan as established by provider Assessed social determinant of health barriers Assessed the status of the wound  -healing well      Manage Congestive heart failure at home [facility]) (pt-stated)   On track     Care Coordination Interventions: Assessed social determinant of health barriers  Discussed the use of Jardiance  Discussed pending stand up scales with handles for safety         Medication management -side effects  ([facility]) (pt-stated)   On track     Care Coordination Interventions: Evaluation of current treatment plan related to Jardiance & side effects and patient's adherence to plan as established by provider Discussed plans with patient for ongoing care management follow up and provided patient with direct contact information for care management team Discussed the side effects with patient which includes dizziness, dehydration (dry skin)  symptoms, joint pain, Discussed the use of Jardiance's side effects to assist with weight loss, treat heart failure & the removal of sugar in the kidneys Encouraged hydration when he re starts the Jardiance plus monitoring for side effects & reporting them to RN CM, pcp staff Reviewed again the availability of [facility] pharmacy staff to assist as needed        SDOH assessments and interventions completed:  No  SDOH Interventions Today    Flowsheet Row Most Recent Value  SDOH Interventions   Food Insecurity Interventions Intervention Not Indicated  Transportation Interventions Intervention Not Indicated        Care Coordination Interventions Activated:  Yes  Care Coordination Interventions:  Yes, provided   Follow up plan: Follow up call scheduled for 10/09/21    Encounter Outcome:  Pt. Visit Completed   Jerrin Recore L. Lavina Hamman, RN, BSN, [place] Coordinator Office number [phone]

## 2021-09-25 NOTE — Telephone Encounter (Signed)
[place] Education officer, museum called from [facility], she visited with patient yesterday and address needs and concerns and  no further orders are needed at this time.

## 2021-09-27 DIAGNOSIS — Z993 Dependence on wheelchair: Secondary | ICD-10-CM | POA: Diagnosis not present

## 2021-09-27 DIAGNOSIS — E7849 Other hyperlipidemia: Secondary | ICD-10-CM | POA: Diagnosis not present

## 2021-09-27 DIAGNOSIS — G8250 Quadriplegia, unspecified: Secondary | ICD-10-CM | POA: Diagnosis not present

## 2021-09-27 DIAGNOSIS — I509 Heart failure, unspecified: Secondary | ICD-10-CM | POA: Diagnosis not present

## 2021-09-27 DIAGNOSIS — I110 Hypertensive heart disease with heart failure: Secondary | ICD-10-CM | POA: Diagnosis not present

## 2021-09-27 DIAGNOSIS — S14105S Unspecified injury at C5 level of cervical spinal cord, sequela: Secondary | ICD-10-CM | POA: Diagnosis not present

## 2021-09-27 NOTE — Telephone Encounter (Signed)
Received form to be faxed to [facility], form has been faxed called pt to let him know no answer vm full

## 2021-09-27 NOTE — Telephone Encounter (Signed)
Do you have any paperwork for him? I'm not sure what he is referring to

## 2021-09-27 NOTE — Telephone Encounter (Signed)
Paperwork was filled out by [place] and I faxed to [facility] this morning

## 2021-09-28 NOTE — Patient Instructions (Signed)
Visit Information  Thank you for taking time to visit with me today. Please don't hesitate to contact me if I can be of assistance to you.   Following are the goals we discussed today:   Goals Addressed               This Visit's Progress     Patient Stated     assistance with getting personal care services [facility]) (pt-stated)   On track     Care Coordination Interventions: Collaborated with pcp and Sw as needed  regarding needs for personal care services  Discussed services with patient Confirmed outreach from [facility] SW on 09/19/21- will continue to discuss case with SW       Healing of perirectal fistula/wound (THN) (pt-stated)   On track     Care Coordination Interventions: Evaluation of current treatment plan related to perianal fistula/wound and patient's adherence to plan as established by provider Assessed social determinant of health barriers Assessed the status of the wound  -healing well      Manage Congestive heart failure at home [facility]) (pt-stated)   On track     Care Coordination Interventions: Assessed social determinant of health barriers  Evaluated for worsening symptoms of Congestive heart failure like swelling Discussed pending stand up scales with handles for safety         Medication management -side effects  (THN) (pt-stated)   On track     Care Coordination Interventions: Evaluation of current treatment plan related to Jardiance & side effects and patient's adherence to plan as established by provider Discussed plans with patient for ongoing care management follow up and provided patient with direct contact information for care management team Discussed the side effects with patient which includes dizziness, dehydration (dry skin) symptoms, joint pain, Discussed the use of Jardiance's side effects to assist with weight loss, treat heart failure & the removal of sugar in the kidneys Encouraged hydration when he re starts the Jardiance plus monitoring for side  effects & reporting them to RN CM, pcp staff Reviewed again the availability of [facility] pharmacy staff to assist as needed        Our next appointment is by telephone on 10/09/21 at 1 pm  Please call the care guide team at [phone] if you need to cancel or reschedule your appointment.   If you are experiencing a Mental Health or [place] or need someone to talk to, please call the Suicide and Crisis Lifeline: 988 call the [place] National Suicide Prevention Lifeline: 514-400-0285 or TTY: (567)588-1900 TTY (801) 822-7916) to talk to a trained counselor call 1-800-273-TALK (toll free, 24 hour hotline) call the [facility]: (408)166-2450 call 911   Patient verbalizes understanding of instructions and care plan provided today and agrees to view in [place]. Active MyChart status and patient understanding of how to access instructions and care plan via MyChart confirmed with patient.     The patient has been provided with contact information for the care management team and has been advised to call with any health related questions or concerns.   [place] Lavina Hamman, RN, BSN, [place] Coordinator Office number [phone]

## 2021-10-01 DIAGNOSIS — J4540 Moderate persistent asthma, uncomplicated: Secondary | ICD-10-CM | POA: Diagnosis not present

## 2021-10-01 NOTE — Telephone Encounter (Signed)
Per [facility] procedure will be at least 3 months out.   Will route to preop team for input on Eliquis as will have completed at least 11mos of therapy at time of procedure.   Upcoming visit 10/25/21 with Jordan Jensen, [place]. Clearance can be addressed at that time. Appt notes updated. Will fax to surgical team so they are aware.   Loel Dubonnet, NP

## 2021-10-01 NOTE — Telephone Encounter (Signed)
Spoke with RN at [facility] and she stated that the Surgeon would like to see the patient in his office prior to surgery and that would be atleast 3 months out.

## 2021-10-02 ENCOUNTER — Encounter: Payer: Self-pay | Admitting: *Deleted

## 2021-10-02 ENCOUNTER — Ambulatory Visit: Payer: Self-pay | Admitting: *Deleted

## 2021-10-02 DIAGNOSIS — G8250 Quadriplegia, unspecified: Secondary | ICD-10-CM | POA: Diagnosis not present

## 2021-10-02 DIAGNOSIS — I509 Heart failure, unspecified: Secondary | ICD-10-CM | POA: Diagnosis not present

## 2021-10-02 DIAGNOSIS — I110 Hypertensive heart disease with heart failure: Secondary | ICD-10-CM | POA: Diagnosis not present

## 2021-10-02 DIAGNOSIS — S14105S Unspecified injury at C5 level of cervical spinal cord, sequela: Secondary | ICD-10-CM | POA: Diagnosis not present

## 2021-10-02 DIAGNOSIS — E7849 Other hyperlipidemia: Secondary | ICD-10-CM | POA: Diagnosis not present

## 2021-10-02 DIAGNOSIS — Z993 Dependence on wheelchair: Secondary | ICD-10-CM | POA: Diagnosis not present

## 2021-10-02 NOTE — Patient Instructions (Signed)
Visit Information  Thank you for taking time to visit with me today. Please don't hesitate to contact me if I can be of assistance to you.   Following are the goals we discussed today:   Goals Addressed               This Visit's Progress     Receive Assistance with Completion of Personal Care Services Application through [employer]. (pt-stated)   On track     Care Coordination Interventions:  Active listening/reflection utilized.  Problem solving/task-centered strategies developed. Verbalization of feelings encouraged.  Emailed the following list of information on 09/24/2021 (patient reported not receiving):      [place] Providers Mailed the following list of information on 10/02/2021:  [place] Providers Collaboration with Primary Care Provider, Dr. Vena Rua to request review and signature of [place] application. Collaboration with Primary Care Provider, Dr. Vena Rua to request nurse fax completed [place] application to [employer] [phone]) for processing.   Encouraged patient to review list of [place] Providers, and be prepared to choose 3 providers of interest.          Our next appointment is by telephone on 10/11/2021 at 1:15 pm.  Please call the care guide team at [phone] if you need to cancel or reschedule your appointment.   If you are experiencing a Mental Health or [place] or need someone to talk to, please call the Suicide and Crisis Lifeline: 988 call the [place] National Suicide Prevention Lifeline: 442-764-0112 or TTY: 501-426-5800 TTY 8643443187) to talk to a trained counselor call 1-800-273-TALK (toll free, 24 hour hotline) go to [facility]  [address], [place] [phone]) call the [place]: 607-611-4005 call 911  Patient verbalizes understanding of instructions and care plan provided today and agrees to view in [place]. Active MyChart status and patient understanding of how to access instructions and care plan via MyChart confirmed with patient.     Telephone follow up appointment with care management team member scheduled for:  10/11/2021 at 1:15 pm.  Nat Christen, BSW, MSW, [place]  Licensed Clinical Social Worker  [place]  Mailing [place]. [address] [place], [place] [address] Physical Address-[address] [place], [place] [address] Toll Free Main # [phone] Fax # [phone] Cell # [phone] [name]

## 2021-10-02 NOTE — Patient Outreach (Signed)
  Care Coordination   Follow Up Visit Note   10/02/2021  Name: Jordan Jensen MRN: [id number] DOB: [birth year]  Jordan Jensen is a 51 y.o. year old male who sees Doren Custard, Hazle Nordmann, MD for primary care. I spoke with [name] by phone today.  What matters to the patients health and wellness today? Receive Assistance with Completion of Personal Care Services Application through [employer].     Goals Addressed               This Visit's Progress     Receive Assistance with Completion of Personal Care Services Application through [employer]. (pt-stated)   On track     Care Coordination Interventions:  Active listening/reflection utilized.  Problem solving/task-centered strategies developed. Verbalization of feelings encouraged.  Emailed the following list of information on 09/24/2021 (patient reported not receiving):      [place] Providers Mailed the following list of information on 10/02/2021:  [place] Providers Collaboration with Primary Care Provider, Dr. Vena Rua to request review and signature of [place] application. Collaboration with Primary Care Provider, Dr. Vena Rua to request nurse fax completed [place] application to [employer] [phone]) for processing.   Encouraged patient to review list of Personal Care Services Providers, and be prepared to choose 3 providers of interest.          SDOH assessments and interventions completed:  Yes.   Care Coordination Interventions Activated:  Yes.   Care Coordination Interventions:  Yes, provided.   Follow up plan: Follow up call scheduled for 10/11/2021 at 1:15 pm.  Encounter Outcome:  Pt. Visit Completed.   Nat Christen, BSW, MSW, LCSW  Licensed  Education officer, [facility]  Mailing [place] [address] [place], [place] [address] Physical Address-[address] [place], [place] [address] Toll Free Main # [phone] Fax # [phone] Cell # [phone] [name]

## 2021-10-03 ENCOUNTER — Encounter: Payer: Medicare Other | Admitting: *Deleted

## 2021-10-04 NOTE — Telephone Encounter (Signed)
Patient with diagnosis of DVT (diagnosed 08/28/21) on Eliquis for anticoagulation.    Procedure: EUA to repair Peri Anal Fistula   Date of procedure: TBD   CrCl 91 Platelet count 213K  If procedure is to be within 6 months of DVT diagnosis, patient will need to hold Eliquis for 2 days with Lovenox bridge.  **This guidance is not considered finalized until pre-operative APP has relayed final recommendations.**

## 2021-10-05 DIAGNOSIS — M6281 Muscle weakness (generalized): Secondary | ICD-10-CM | POA: Diagnosis not present

## 2021-10-05 DIAGNOSIS — J449 Chronic obstructive pulmonary disease, unspecified: Secondary | ICD-10-CM | POA: Diagnosis not present

## 2021-10-07 DIAGNOSIS — G8250 Quadriplegia, unspecified: Secondary | ICD-10-CM | POA: Diagnosis not present

## 2021-10-07 DIAGNOSIS — I110 Hypertensive heart disease with heart failure: Secondary | ICD-10-CM | POA: Diagnosis not present

## 2021-10-07 DIAGNOSIS — I509 Heart failure, unspecified: Secondary | ICD-10-CM | POA: Diagnosis not present

## 2021-10-07 DIAGNOSIS — E7849 Other hyperlipidemia: Secondary | ICD-10-CM | POA: Diagnosis not present

## 2021-10-07 DIAGNOSIS — S14105S Unspecified injury at C5 level of cervical spinal cord, sequela: Secondary | ICD-10-CM | POA: Diagnosis not present

## 2021-10-07 DIAGNOSIS — Z993 Dependence on wheelchair: Secondary | ICD-10-CM | POA: Diagnosis not present

## 2021-10-08 ENCOUNTER — Encounter: Payer: Medicare Other | Admitting: Internal Medicine

## 2021-10-08 DIAGNOSIS — G4733 Obstructive sleep apnea (adult) (pediatric): Secondary | ICD-10-CM | POA: Diagnosis not present

## 2021-10-09 ENCOUNTER — Ambulatory Visit: Payer: Self-pay | Admitting: *Deleted

## 2021-10-09 NOTE — Patient Outreach (Signed)
  Care Coordination   10/09/2021 Name: Jordan Jensen MRN: [id number] DOB: [birth year]   Care Coordination Outreach Attempts:  An unsuccessful telephone outreach was attempted today to offer the patient information about available care coordination services as a benefit of their health plan.   Follow Up Plan:  Additional outreach attempts will be made to offer the patient care coordination information and services.   Encounter Outcome:  No Answer  Care Coordination Interventions Activated:  No   Care Coordination Interventions:  No, not indicated    SIG [place] L. Lavina Hamman, RN, BSN, [place] Coordinator Office number [phone]

## 2021-10-11 ENCOUNTER — Encounter (HOSPITAL_COMMUNITY): Payer: Self-pay | Admitting: Occupational Therapy

## 2021-10-11 ENCOUNTER — Ambulatory Visit: Payer: Self-pay | Admitting: *Deleted

## 2021-10-11 ENCOUNTER — Ambulatory Visit (HOSPITAL_COMMUNITY): Payer: Medicare Other | Attending: Nurse Practitioner | Admitting: Occupational Therapy

## 2021-10-11 DIAGNOSIS — I890 Lymphedema, not elsewhere classified: Secondary | ICD-10-CM | POA: Diagnosis not present

## 2021-10-11 DIAGNOSIS — I5020 Unspecified systolic (congestive) heart failure: Secondary | ICD-10-CM | POA: Insufficient documentation

## 2021-10-11 DIAGNOSIS — I824Y9 Acute embolism and thrombosis of unspecified deep veins of unspecified proximal lower extremity: Secondary | ICD-10-CM | POA: Diagnosis not present

## 2021-10-11 DIAGNOSIS — M25612 Stiffness of left shoulder, not elsewhere classified: Secondary | ICD-10-CM | POA: Diagnosis not present

## 2021-10-11 DIAGNOSIS — M6281 Muscle weakness (generalized): Secondary | ICD-10-CM | POA: Insufficient documentation

## 2021-10-11 DIAGNOSIS — I5043 Acute on chronic combined systolic (congestive) and diastolic (congestive) heart failure: Secondary | ICD-10-CM | POA: Diagnosis not present

## 2021-10-11 DIAGNOSIS — Z01810 Encounter for preprocedural cardiovascular examination: Secondary | ICD-10-CM | POA: Insufficient documentation

## 2021-10-11 NOTE — Therapy (Signed)
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                                                                                _4  Yes _5  No Does patient or caregiver demonstrate ability/potential ability & willingness to safely use the mobility device?   _6  Yes _7  No Does patient's home environment support use of recommended mobility device?                                                    _8  Yes _9  No Does  patient have sufficient upper extremity function necessary to functionally propel a manual wheelchair?    _10  Yes _11  No Does patient have sufficient strength and trunk stability to safely operate a POV (scooter)?                                  _12  Yes _13  No Does patient need additional features/benefits provided by a power wheelchair for MRADL's in the home?       _14  Yes _15  No Does the patient demonstrate the ability to safely use a power wheelchair?                                                              _16  Yes _17  No  Therapist Name Printed: Arvil Persons, OT Date: 10/11/21  Therapist's Signature:   Date:   Supplier's Name Printed:  Date:   Supplier's Signature:   Date:  Patient/Caregiver Signature:   Date:     This is to certify that I have read this evaluation and do agree with the content within:   21 Name Printed:   55 Signature:  Date:     This is to certify that I, the above signed therapist have the following affiliations: _18  This DME provider _19  Manufacturer of recommended equipment _20  Patient's long term care facility _21  None of the above   Arvil Persons, OTR/L [phone] 10/11/2021, 2:26 PM

## 2021-10-11 NOTE — Patient Outreach (Signed)
  Care Coordination   10/11/2021  Name: SENAY SISTRUNK MRN: [id number] DOB: September 14, 1970   Care Coordination Outreach Attempts:  An unsuccessful telephone outreach was attempted today to offer the patient information about available care coordination services as a benefit of their health plan. HIPAA compliant message left on voicemail, providing contact information for CSW, encouraging patient to return CSW's call at his earliest convenience.  Follow Up Plan:  Additional outreach attempts will be made to offer the patient care coordination information and services.   Encounter Outcome:  No Answer.   Care Coordination Interventions Activated:  No.    Care Coordination Interventions:  No, not indicated.    Nat Christen, BSW, MSW, LCSW  Licensed Education officer, [facility]  Mailing [place] [address], [place], [place] [address] Physical Address-[address] [place], [place] [address] Toll Free Main # [phone] Fax # [phone] Cell # [phone] [name]

## 2021-10-12 LAB — CBC
Hematocrit: 45.4 % (ref 37.5–51.0)
Hemoglobin: 14.6 g/dL (ref 13.0–17.7)
MCH: 28.1 pg (ref 26.6–33.0)
MCHC: 32.2 g/dL (ref 31.5–35.7)
MCV: 87 fL (ref 79–97)
Platelets: 194 10*3/uL (ref 150–450)
RBC: 5.2 x10E6/uL (ref 4.14–5.80)
RDW: 13.8 % (ref 11.6–15.4)
WBC: 13.4 10*3/uL — ABNORMAL HIGH (ref 3.4–10.8)

## 2021-10-12 LAB — BASIC METABOLIC PANEL
BUN/Creatinine Ratio: 13 (ref 9–20)
BUN: 13 mg/dL (ref 6–24)
CO2: 29 mmol/L (ref 20–29)
Calcium: 9.1 mg/dL (ref 8.7–10.2)
Chloride: 94 mmol/L — ABNORMAL LOW (ref 96–106)
Creatinine, Ser: 1.03 mg/dL (ref 0.76–1.27)
Glucose: 128 mg/dL — ABNORMAL HIGH (ref 70–99)
Potassium: 4 mmol/L (ref 3.5–5.2)
Sodium: 141 mmol/L (ref 134–144)
eGFR: 87 mL/min/{1.73_m2} (ref >59)

## 2021-10-14 ENCOUNTER — Ambulatory Visit: Payer: Self-pay | Admitting: *Deleted

## 2021-10-14 DIAGNOSIS — Z993 Dependence on wheelchair: Secondary | ICD-10-CM | POA: Diagnosis not present

## 2021-10-14 DIAGNOSIS — I5020 Unspecified systolic (congestive) heart failure: Secondary | ICD-10-CM | POA: Diagnosis not present

## 2021-10-14 DIAGNOSIS — Z8719 Personal history of other diseases of the digestive system: Secondary | ICD-10-CM | POA: Diagnosis not present

## 2021-10-14 NOTE — Patient Outreach (Signed)
  Care Coordination   10/14/2021 Name: Jordan Jensen MRN: [id number] DOB: 1970/06/21   Care Coordination Outreach Attempts:  A second unsuccessful outreach was attempted today to offer the patient with information about available care coordination services as a benefit of their health plan.     Follow Up Plan:  Additional outreach attempts will be made to offer the patient care coordination information and services.   Encounter Outcome:  No Answer  Care Coordination Interventions Activated:  No   Care Coordination Interventions:  No, not indicated    SIG Kyndra Condron L. Lavina Hamman, RN, BSN, [place] Coordinator Office number [phone]

## 2021-10-14 NOTE — Progress Notes (Signed)
Kidney function is stable, WBC is elevated this could be due to his perirectal fistula he should maintain close follow up with general surgery . patient should reports fever, chills, malaise

## 2021-10-18 ENCOUNTER — Telehealth: Payer: Self-pay | Admitting: Internal Medicine

## 2021-10-18 ENCOUNTER — Encounter: Payer: Self-pay | Admitting: Internal Medicine

## 2021-10-18 ENCOUNTER — Encounter: Payer: Medicare HMO | Admitting: Nurse Practitioner

## 2021-10-18 ENCOUNTER — Telehealth: Payer: Self-pay

## 2021-10-18 ENCOUNTER — Ambulatory Visit (INDEPENDENT_AMBULATORY_CARE_PROVIDER_SITE_OTHER): Payer: Medicare Other | Admitting: Internal Medicine

## 2021-10-18 VITALS — BP 81/49 | HR 60 | Ht 71.0 in | Wt >= 6400 oz

## 2021-10-18 DIAGNOSIS — K6289 Other specified diseases of anus and rectum: Secondary | ICD-10-CM

## 2021-10-18 DIAGNOSIS — I890 Lymphedema, not elsewhere classified: Secondary | ICD-10-CM

## 2021-10-18 DIAGNOSIS — Z0001 Encounter for general adult medical examination with abnormal findings: Secondary | ICD-10-CM

## 2021-10-18 DIAGNOSIS — G8929 Other chronic pain: Secondary | ICD-10-CM

## 2021-10-18 DIAGNOSIS — G4733 Obstructive sleep apnea (adult) (pediatric): Secondary | ICD-10-CM | POA: Diagnosis not present

## 2021-10-18 DIAGNOSIS — L989 Disorder of the skin and subcutaneous tissue, unspecified: Secondary | ICD-10-CM

## 2021-10-18 DIAGNOSIS — M25512 Pain in left shoulder: Secondary | ICD-10-CM

## 2021-10-18 DIAGNOSIS — R300 Dysuria: Secondary | ICD-10-CM

## 2021-10-18 NOTE — Patient Instructions (Signed)
It was a pleasure to see you today.  Thank you for giving [place] the opportunity to be involved in your care.  Below is a brief recap of your visit and next steps.  We will plan to see you again in 2 months.  Summary We have completed your annual physical today. We will repeat labs I have provided you with home PT exercises for your shoulder.  Next steps Follow up in 2 months

## 2021-10-18 NOTE — Telephone Encounter (Signed)
Returned call
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
## 2021-10-18 NOTE — Telephone Encounter (Signed)
Jordan Jensen with [facility] called left voice mail returning nurse call  Call back  and request verbal orders and can leave a detailed message phone # [phone]

## 2021-10-18 NOTE — Telephone Encounter (Signed)
Jordan Jensen w. [facility] called in on patient behalf .   Verbal order for Discharge visit today   Call back [phone].

## 2021-10-19 LAB — CBC WITH DIFFERENTIAL/PLATELET
Basophils Absolute: 0.1 10*3/uL (ref 0.0–0.2)
Basos: 0 %
EOS (ABSOLUTE): 0.3 10*3/uL (ref 0.0–0.4)
Eos: 2 %
Hematocrit: 43.2 % (ref 37.5–51.0)
Hemoglobin: 14.5 g/dL (ref 13.0–17.7)
Immature Grans (Abs): 0 10*3/uL (ref 0.0–0.1)
Immature Granulocytes: 0 %
Lymphocytes Absolute: 2.6 10*3/uL (ref 0.7–3.1)
Lymphs: 18 %
MCH: 29.2 pg (ref 26.6–33.0)
MCHC: 33.6 g/dL (ref 31.5–35.7)
MCV: 87 fL (ref 79–97)
Monocytes Absolute: 0.9 10*3/uL (ref 0.1–0.9)
Monocytes: 6 %
Neutrophils Absolute: 10.2 10*3/uL — ABNORMAL HIGH (ref 1.4–7.0)
Neutrophils: 74 %
Platelets: 190 10*3/uL (ref 150–450)
RBC: 4.97 x10E6/uL (ref 4.14–5.80)
RDW: 13.8 % (ref 11.6–15.4)
WBC: 14 10*3/uL — ABNORMAL HIGH (ref 3.4–10.8)

## 2021-10-19 LAB — CMP14+EGFR
ALT: 20 IU/L (ref 0–44)
AST: 23 IU/L (ref 0–40)
Albumin/Globulin Ratio: 1.2 (ref 1.2–2.2)
Albumin: 4.1 g/dL (ref 3.8–4.9)
Alkaline Phosphatase: 77 IU/L (ref 44–121)
BUN/Creatinine Ratio: 13 (ref 9–20)
BUN: 13 mg/dL (ref 6–24)
Bilirubin Total: 0.5 mg/dL (ref 0.0–1.2)
CO2: 32 mmol/L — ABNORMAL HIGH (ref 20–29)
Calcium: 9.9 mg/dL (ref 8.7–10.2)
Chloride: 95 mmol/L — ABNORMAL LOW (ref 96–106)
Creatinine, Ser: 0.99 mg/dL (ref 0.76–1.27)
Globulin, Total: 3.4 g/dL (ref 1.5–4.5)
Glucose: 87 mg/dL (ref 70–99)
Potassium: 4.1 mmol/L (ref 3.5–5.2)
Sodium: 140 mmol/L (ref 134–144)
Total Protein: 7.5 g/dL (ref 6.0–8.5)
eGFR: 92 mL/min/{1.73_m2} (ref >59)

## 2021-10-19 LAB — SEDIMENTATION RATE: Sed Rate: 48 mm/hr — ABNORMAL HIGH (ref 0–30)

## 2021-10-19 LAB — C-REACTIVE PROTEIN: CRP: 17 mg/L — ABNORMAL HIGH (ref 0–10)

## 2021-10-20 DIAGNOSIS — G4733 Obstructive sleep apnea (adult) (pediatric): Secondary | ICD-10-CM | POA: Diagnosis not present

## 2021-10-20 DIAGNOSIS — J4540 Moderate persistent asthma, uncomplicated: Secondary | ICD-10-CM | POA: Diagnosis not present

## 2021-10-21 ENCOUNTER — Ambulatory Visit: Payer: Self-pay | Admitting: *Deleted

## 2021-10-21 ENCOUNTER — Other Ambulatory Visit (INDEPENDENT_AMBULATORY_CARE_PROVIDER_SITE_OTHER): Payer: Medicare Other

## 2021-10-21 ENCOUNTER — Telehealth: Payer: Self-pay

## 2021-10-21 DIAGNOSIS — L989 Disorder of the skin and subcutaneous tissue, unspecified: Secondary | ICD-10-CM

## 2021-10-21 DIAGNOSIS — R300 Dysuria: Secondary | ICD-10-CM

## 2021-10-21 LAB — POCT URINALYSIS DIP (CLINITEK)
Bilirubin, UA: NEGATIVE
Glucose, UA: NEGATIVE mg/dL
Ketones, POC UA: NEGATIVE mg/dL
Leukocytes, UA: NEGATIVE
Nitrite, UA: NEGATIVE
POC PROTEIN,UA: NEGATIVE
Spec Grav, UA: 1.02 (ref 1.010–1.025)
Urobilinogen, UA: 0.2 E.U./dL
pH, UA: 6 (ref 5.0–8.0)

## 2021-10-21 NOTE — Patient Outreach (Signed)
Care Coordination   Follow Up Visit Note   10/21/2021 Name: ZAVEON GILLEN MRN: [id number] DOB: [birth year]  Kristopher Oppenheim Babington is a 51 y.o. year old male who sees Doren Custard, Hazle Nordmann, MD for primary care. I spoke with  [name] by phone today.  What matters to the patients health and wellness today?  No swelling pain or drainage of the perirectal fistula   He questions if he has a urinary tract infection (UTI) He ws not able to provide a urine specimen at last pcp visit but his daughter is taking an urine sample to the office today  He confirms with assessment he has a poor fluid intake,  He confirms right lower back  but on last week he was having pain on both sides Hx of kidney stone - He states it does not feeling like a kidney stone   CPAP using night before last, doing better goal is to wear 4 hours or more  Personal care services (PCS) aide  missed [facility] call,  still has not received forms in the mail He will check his e-mail   Goals Addressed               This Visit's Progress     Patient Stated     assistance with getting personal care services [facility]) (pt-stated)        Care Coordination Interventions: Collaborated with pcp and Sw as needed  regarding needs for personal care services  Discussed services with patient Encouraged patient to check his e-mail and mail box for personal care forms sent by [facility]        COMPLETED: Healing of perirectal fistula/wound ([facility]) (pt-stated)   On track     Care Coordination Interventions: Evaluation of current treatment plan related to perianal fistula/wound and patient's adherence to plan as established by provider Assessed social determinant of health barriers Assessed the status of the wound  -healing well- o swelling pain or drainage of the perirectal fistula [per patient      Improved symptoms for UTI ([facility]) (pt-stated)   Not on track     Care Coordination Interventions: Discussed importance of adherence to all scheduled  medical appointments Counseled on the importance of reporting any/all new or changed pain symptoms or management strategies to pain management provider Education on urinary tract infection sent via my charti      Manage Congestive heart failure at home [facility]) (pt-stated)   On track     Care Coordination Interventions: Assessed social determinant of health barriers  Evaluated for worsening symptoms of Congestive heart failure like swelling Confirmed without symptoms        Medication management -side effects  ([facility]) (pt-stated)        Care Coordination Interventions: Evaluation of current treatment plan related to Jardiance & side effects and patient's adherence to plan as established by provider Discussed plans with patient for ongoing care management follow up and provided patient with direct contact information for care management team Not addressed in this outreach         SDOH assessments and interventions completed:  Yes  SDOH Interventions Today    Flowsheet Row Most Recent Value  SDOH Interventions   Food Insecurity Interventions Intervention Not Indicated  Transportation Interventions Intervention Not Indicated  Utilities Interventions Intervention Not Indicated        Care Coordination Interventions Activated:  Yes  Care Coordination Interventions:  Yes, provided   Follow up plan: Follow up call scheduled  for 11/07/21    Encounter Outcome:  Pt. Visit Completed   Deloris Moger L. Noelle Penner, RN, BSN, CCM [facility] Care Coordinator Office number [phone]

## 2021-10-21 NOTE — Telephone Encounter (Signed)
Patient daughter dropped off urine in lab.

## 2021-10-21 NOTE — Patient Instructions (Addendum)
Visit Information  Thank you for taking time to visit with me today. Please don't hesitate to contact me if I can be of assistance to you.   Following are the goals we discussed today:   Goals Addressed               This Visit's Progress     Patient Stated     assistance with getting personal care services [facility]) (pt-stated)        Care Coordination Interventions: Collaborated with pcp and Sw as needed  regarding needs for personal care services  Discussed services with patient Encouraged patient to check his e-mail and mail box for personal care forms sent by [facility]        COMPLETED: Healing of perirectal fistula/wound ([facility]) (pt-stated)   On track     Care Coordination Interventions: Evaluation of current treatment plan related to perianal fistula/wound and patient's adherence to plan as established by provider Assessed social determinant of health barriers Assessed the status of the wound  -healing well- o swelling pain or drainage of the perirectal fistula [per patient      Improved symptoms for UTI ([facility]) (pt-stated)   Not on track     Care Coordination Interventions: Discussed importance of adherence to all scheduled medical appointments Counseled on the importance of reporting any/all new or changed pain symptoms or management strategies to pain management provider Education on urinary tract infection sent via my charti      Manage Congestive heart failure at home [facility]) (pt-stated)   On track     Care Coordination Interventions: Assessed social determinant of health barriers  Evaluated for worsening symptoms of Congestive heart failure like swelling Confirmed without symptoms        Medication management -side effects  ([facility]) (pt-stated)        Care Coordination Interventions: Evaluation of current treatment plan related to Jardiance & side effects and patient's adherence to plan as established by provider Discussed plans with patient for ongoing care management follow  up and provided patient with direct contact information for care management team Not addressed in this outreach         Our next appointment is by telephone on 11/07/21 at 1:30 pm  Please call the care guide team at [phone] if you need to cancel or reschedule your appointment.   If you are experiencing a Mental Health or [place] or need someone to talk to, please call the Suicide and Crisis Lifeline: 988 call the [place] National Suicide Prevention Lifeline: 385-423-3026 or TTY: 479-657-9951 TTY 334-080-6930) to talk to a trained counselor call 1-800-273-TALK (toll free, 24 hour hotline) call the [facility]: 8677199801 call 911   Patient verbalizes understanding of instructions and care plan provided today and agrees to view in [place]. Active MyChart status and patient understanding of how to access instructions and care plan via MyChart confirmed with patient.     The patient has been provided with contact information for the care management team and has been advised to call with any health related questions or concerns.   Salar Molden L. Lavina Hamman, RN, BSN, [place] Coordinator Office number [phone]

## 2021-10-22 LAB — UA/M W/RFLX CULTURE, ROUTINE
Bilirubin, UA: NEGATIVE
Glucose, UA: NEGATIVE
Ketones, UA: NEGATIVE
Leukocytes,UA: NEGATIVE
Nitrite, UA: NEGATIVE
Protein,UA: NEGATIVE
RBC, UA: NEGATIVE
Specific Gravity, UA: 1.011 (ref 1.005–1.030)
Urobilinogen, Ur: 0.2 mg/dL (ref 0.2–1.0)
pH, UA: 6 (ref 5.0–7.5)

## 2021-10-22 LAB — MICROSCOPIC EXAMINATION
Bacteria, UA: NONE SEEN
Casts: NONE SEEN /lpf
RBC, Urine: NONE SEEN /hpf (ref 0–2)
WBC, UA: NONE SEEN /hpf (ref 0–5)

## 2021-10-23 ENCOUNTER — Encounter: Payer: Self-pay | Admitting: Internal Medicine

## 2021-10-23 DIAGNOSIS — Z0001 Encounter for general adult medical examination with abnormal findings: Secondary | ICD-10-CM | POA: Insufficient documentation

## 2021-10-23 DIAGNOSIS — L989 Disorder of the skin and subcutaneous tissue, unspecified: Secondary | ICD-10-CM | POA: Insufficient documentation

## 2021-10-23 NOTE — Assessment & Plan Note (Signed)
Today he request referral to dermatology for evaluation of a lesion on his scalp.  There is a small, well-circumscribed lesion without concerning features on the posterior aspect of his scalp. -Dermatology referral placed today

## 2021-10-23 NOTE — Assessment & Plan Note (Signed)
He has limited abduction and internal rotation on exam today.  This is a chronic issue.  He describes pain over the superior and lateral aspects of his shoulder, which seems most consistent with impingement syndrome.  I have provided him with home PT exercises today.

## 2021-10-23 NOTE — Assessment & Plan Note (Signed)
Today he endorses 1 week history of dysuria with discoloration of urine, foul odor, and flank pain.  He is concerned for UTI.  He has recently taken 6 tablets of leftover doxycycline which seemed to improve his symptoms. -UA ordered today

## 2021-10-23 NOTE — Assessment & Plan Note (Signed)
Nonpitting bilateral lower extremity edema noted on exam.  I recommended that he consistently wear compression stockings.  A new order for compression stockings was placed today

## 2021-10-23 NOTE — Assessment & Plan Note (Signed)
His past medical history is significant for multiple perirectal abscesses and a recent perianal fistula.  He was recently seen by general surgery to discuss surgical repair of perianal fistula but he declined examination and stated that he was not interested in surgery at that time.  He states that his symptoms improved after undergoing colonoscopy.  Today he endorses flank pain and is concerned for UTI.  We will check UA today.  I will also check inflammatory markers which, if elevated in the setting of a UA not consistent with infection, could be concerning for other source of infection.

## 2021-10-23 NOTE — Assessment & Plan Note (Signed)
Presenting today for annual physical exam. -Repeat labs ordered today -He states that he has an appointment to receive COVID-19 vaccination this afternoon -I recommended that he receive outstanding Shingrix vaccine at his pharmacy
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
## 2021-10-25 ENCOUNTER — Ambulatory Visit (INDEPENDENT_AMBULATORY_CARE_PROVIDER_SITE_OTHER): Payer: Medicare Other | Admitting: Student

## 2021-10-25 ENCOUNTER — Encounter: Payer: Self-pay | Admitting: Student

## 2021-10-25 VITALS — BP 96/54 | HR 85 | Ht 71.0 in | Wt >= 6400 oz

## 2021-10-25 DIAGNOSIS — I890 Lymphedema, not elsewhere classified: Secondary | ICD-10-CM | POA: Diagnosis not present

## 2021-10-25 DIAGNOSIS — I5020 Unspecified systolic (congestive) heart failure: Secondary | ICD-10-CM | POA: Diagnosis not present

## 2021-10-25 DIAGNOSIS — Z01810 Encounter for preprocedural cardiovascular examination: Secondary | ICD-10-CM

## 2021-10-25 DIAGNOSIS — I824Y9 Acute embolism and thrombosis of unspecified deep veins of unspecified proximal lower extremity: Secondary | ICD-10-CM | POA: Diagnosis not present

## 2021-10-25 NOTE — Patient Instructions (Signed)
Medication Instructions:  Your physician recommends that you continue on your current medications as directed. Please refer to the Current Medication list given to you today.   Labwork: None today  Testing/Procedures: None today  Follow-Up: Follow with [facility]  Any Other Special Instructions Will Be Listed Below (If Applicable).   You have been referred to [facility]. They will call you to schedule appointment.   If you need a refill on your cardiac medications before your next appointment, please call your pharmacy.

## 2021-10-27 ENCOUNTER — Encounter: Payer: Self-pay | Admitting: Student

## 2021-10-28 ENCOUNTER — Encounter: Payer: Self-pay | Admitting: *Deleted

## 2021-10-28 ENCOUNTER — Ambulatory Visit: Payer: Self-pay | Admitting: *Deleted

## 2021-10-28 NOTE — Patient Outreach (Signed)
  Care Coordination   Follow Up Visit Note   10/28/2021  Name: ROMAIN ERION MRN: [id number] DOB: 1970/01/28  Kristopher Oppenheim Hardaway is a 51 y.o. year old male who sees Doren Custard, Hazle Nordmann, MD for primary care. I spoke with [name] by phone today.  What matters to the patients health and wellness today?   Receive Assistance with Completion of Personal Care Services Application.   Goals Addressed               This Visit's Progress     Receive Assistance with Completion of Personal Care Services Application through [employer]. (pt-stated)   On track     Care Coordination Interventions:  Active listening/reflection utilized.  Problem solving/task-centered strategies developed. Verbalization of feelings encouraged.  Reviewed the following list of information:       [place] Providers Encouraged patient to submit completed Personal Care Services application to Primary Care Provider, Dr. Marland Kitchen, for review and signature. Collaboration with Primary Care Provider, Dr. Marland Kitchen to request review and signature of [place] application. Collaboration with Primary Care Provider, Dr. Marland Kitchen to request nurse fax completed [place] application to [facility] [phone]) for processing.   Encouraged patient to review list of Personal Care Services Providers, and be prepared to choose 3 providers of interest.        SDOH assessments and interventions completed:  Yes.  Care Coordination Interventions Activated:  Yes.   Care Coordination Interventions:  Yes, provided.   Follow up plan: Follow up call scheduled for 11/11/2021 at 9:15 am.  Encounter Outcome:  Pt. Visit Completed.   Nat Christen, BSW, MSW, LCSW  Licensed Education officer, environmental [facility]  Mailing [place] [address], [place], [place] [address] Physical Address-[address], [place], [place] [address] Toll Free Main # [phone] Fax # [phone] Cell # [phone] [name]

## 2021-10-28 NOTE — Patient Instructions (Signed)
Visit Information  Thank you for taking time to visit with me today. Please don't hesitate to contact me if I can be of assistance to you.   Following are the goals we discussed today:   Goals Addressed               This Visit's Progress     Receive Assistance with Completion of Personal Care Services Application. (pt-stated)   On track     Care Coordination Interventions:  Active listening/reflection utilized.  Problem solving/task-centered strategies developed. Verbalization of feelings encouraged.  Reviewed the following list of information:  [place] Providers Encouraged patient to submit completed Personal Care Services application to Primary Care Provider, Dr. Marland Kitchen, for review and signature. Collaboration with Primary Care Provider, Dr. Marland Kitchen to request review and signature of [place] application. Collaboration with Primary Care Provider, Dr. Marland Kitchen to request nurse fax completed [place] application to [employer] [phone]) for processing.   Encouraged patient to review list of [place] Providers, and be prepared to choose 3 providers of interest.          Our next appointment is by telephone on 11/11/2021 at 9:15 am.  Please call the care guide team at [phone] if you need to cancel or reschedule your appointment.   If you are experiencing a Mental Health or [place] or need someone to talk to, please call the Suicide and Crisis Lifeline: 988 call the [place] National Suicide Prevention Lifeline: (684)550-0907 or TTY: 3105256635 TTY 423-340-0342) to talk to a trained counselor call 1-800-273-TALK (toll free, 24 hour hotline) go to [facility] [address], [place] [phone]) call the [place]: [phone] call  911  Patient verbalizes understanding of instructions and care plan provided today and agrees to view in [place]. Active MyChart status and patient understanding of how to access instructions and care plan via MyChart confirmed with patient.     Telephone follow up appointment with care management team member scheduled for:  11/11/2021 at 9:15 am.  Nat Christen, BSW, MSW, [place]  Licensed Clinical Social Worker  [place]  Mailing [place]. [address] [place], [place] [address] Physical Address-[address], [place], [place] [address] Toll Free Main # [phone] Fax # [phone] Cell # [phone] [name]

## 2021-10-29 ENCOUNTER — Telehealth: Payer: Self-pay | Admitting: Internal Medicine

## 2021-10-29 NOTE — Telephone Encounter (Signed)
Faxed

## 2021-10-29 NOTE — Telephone Encounter (Signed)
[employer] [employer] called stating they have received the order for the patient but it was missing the last office note. Can you please send notes?   Ph# [phone]  Fx# [phone]

## 2021-10-31 DIAGNOSIS — J4540 Moderate persistent asthma, uncomplicated: Secondary | ICD-10-CM | POA: Diagnosis not present

## 2021-11-05 DIAGNOSIS — J449 Chronic obstructive pulmonary disease, unspecified: Secondary | ICD-10-CM | POA: Diagnosis not present

## 2021-11-05 DIAGNOSIS — M6281 Muscle weakness (generalized): Secondary | ICD-10-CM | POA: Diagnosis not present

## 2021-11-06 DIAGNOSIS — G4733 Obstructive sleep apnea (adult) (pediatric): Secondary | ICD-10-CM | POA: Diagnosis not present

## 2021-11-07 ENCOUNTER — Ambulatory Visit: Payer: Self-pay | Admitting: *Deleted

## 2021-11-08 NOTE — Patient Outreach (Signed)
  Care Coordination   Follow Up Visit Note   11/21/2021 Name: Jordan Jensen MRN: [id number] DOB: 1970-04-11  Jordan Jensen Jordan Jensen is a 51 y.o. year old male who sees Jordan Jensen, Jordan Nordmann, Jordan Jensen for primary care. I spoke with  Jordan Jensen by phone today.  What matters to the patients health and wellness today?  Electric wheelchair (adapt) Outpatient therapies Jordan Jensen) since he recently completed his home health therapy      Goals Addressed               This Visit's Progress     Patient Stated     Community resources Jordan Jensen) (pt-stated)   Not on track     Care Coordination Interventions: Collaborate with pcp and [employer] regarding outpatient home health services and an electric wheelchair Discussed plans with patient for ongoing care management follow up and provided patient with direct contact information for care management team          SDOH assessments and interventions completed:  No     Care Coordination Interventions Activated:  Yes  Care Coordination Interventions:  Yes, provided   Follow up plan: Follow up call scheduled for 11/21/21    Encounter Outcome:  Pt. Visit Completed   Jordan Jensen L. Jordan Hamman, RN, BSN, Jordan Jensen Coordinator Office number [phone]

## 2021-11-08 NOTE — Patient Instructions (Addendum)
Visit Information  Thank you for taking time to visit with me today. Please don't hesitate to contact me if I can be of assistance to you.   Following are the goals we discussed today:   Goals Addressed               This Visit's Progress     Patient Stated     Community resources [facility]) (pt-stated)   Not on track     Care Coordination Interventions: Collaborate with pcp and [employer] regarding outpatient home health services and an electric wheelchair Discussed plans with patient for ongoing care management follow up and provided patient with direct contact information for care management team          Our next appointment is by telephone on 11/21/21 at 1100  Please call the care guide team at [phone] if you need to cancel or reschedule your appointment.   If you are experiencing a Mental Health or [place] or need someone to talk to, please call the Suicide and Crisis Lifeline: 988 call the [place] National Suicide Prevention Lifeline: (615)525-1193 or TTY: 403-515-5914 TTY 910-201-7219) to talk to a trained counselor call 1-800-273-TALK (toll free, 24 hour hotline) call the [facility]: 9738437950 call 911   Patient verbalizes understanding of instructions and care plan provided today and agrees to view in [place]. Active MyChart status and patient understanding of how to access instructions and care plan via MyChart confirmed with patient.     The patient has been provided with contact information for the care management team and has been advised to call with any health related questions or concerns.   Jacarius Handel L. Lavina Hamman, RN, BSN, [place] Coordinator Office number [phone]

## 2021-11-11 ENCOUNTER — Ambulatory Visit: Payer: Self-pay | Admitting: *Deleted

## 2021-11-11 NOTE — Patient Outreach (Signed)
  Care Coordination   11/11/2021  Name: Jordan Jensen MRN: [id number] DOB: 26-Jun-1970   Care Coordination Outreach Attempts:  An unsuccessful telephone outreach was attempted today to offer the patient information about available care coordination services as a benefit of their health plan. HIPAA compliant message left on voicemail, providing contact information for CSW, encouraging patient to return CSW's call at his earliest convenience.   Follow Up Plan:  Additional outreach attempts will be made to offer the patient care coordination information and services.    Encounter Outcome:  No Answer.    Care Coordination Interventions Activated:  No.     Care Coordination Interventions:  No, not indicated.     Nat Christen, BSW, MSW, LCSW  Licensed Education officer, [facility]  Mailing [place] [address], [place], [place] [address] Physical Address-[address] [place], [place] [address] Toll Free Main # [phone] Fax # [phone] Cell # [phone] [name]

## 2021-11-15 ENCOUNTER — Telehealth: Payer: Self-pay | Admitting: Internal Medicine

## 2021-11-15 ENCOUNTER — Other Ambulatory Visit: Payer: Self-pay

## 2021-11-15 MED ORDER — APIXABAN 5 MG PO TABS: 5.0000 mg | ORAL_TABLET | Freq: Two times a day (BID) | ORAL | 3 refills | Status: DC

## 2021-11-15 MED ORDER — TORSEMIDE 20 MG PO TABS: 40.0000 mg | ORAL_TABLET | Freq: Two times a day (BID) | ORAL | 3 refills | Status: DC

## 2021-11-15 MED ORDER — POTASSIUM CHLORIDE CRYS ER 20 MEQ PO TBCR: 20.0000 meq | EXTENDED_RELEASE_TABLET | Freq: Two times a day (BID) | ORAL | 3 refills | Status: DC

## 2021-11-15 NOTE — Telephone Encounter (Signed)
PT WANTS CALL BACK WHEN MEDICATION IS SENT TOPHARMACY       Prescription Request  11/15/2021  Is this a "Controlled Substance" medicine? No  LOV: 10/29/2021   What is the name of the medication or equipment? apixaban (ELIQUIS) 5 MG TABS tablet   torsemide (DEMADEX) 20 MG tablet   potassium chloride SA (KLOR-CON M) 20 MEQ tablet   Have you contacted your pharmacy to request a refill? No   Which pharmacy would you like this sent to?  [facility] [address] [place] - [address] [place] [address] Phone: [phone] Fax: [phone]   Patient notified that their request is being sent to the clinical staff for review and that they should receive a response within 2 business days.   Please advise at [phone] (mobile)

## 2021-11-15 NOTE — Telephone Encounter (Signed)
Refills sent and patient aware.

## 2021-11-20 DIAGNOSIS — B078 Other viral warts: Secondary | ICD-10-CM | POA: Diagnosis not present

## 2021-11-20 DIAGNOSIS — L218 Other seborrheic dermatitis: Secondary | ICD-10-CM | POA: Diagnosis not present

## 2021-11-20 DIAGNOSIS — J4540 Moderate persistent asthma, uncomplicated: Secondary | ICD-10-CM | POA: Diagnosis not present

## 2021-11-20 DIAGNOSIS — G4733 Obstructive sleep apnea (adult) (pediatric): Secondary | ICD-10-CM | POA: Diagnosis not present

## 2021-11-21 ENCOUNTER — Encounter: Payer: Self-pay | Admitting: Internal Medicine

## 2021-11-21 ENCOUNTER — Ambulatory Visit: Payer: Self-pay | Admitting: *Deleted

## 2021-11-21 ENCOUNTER — Other Ambulatory Visit: Payer: Self-pay | Admitting: Internal Medicine

## 2021-11-21 DIAGNOSIS — G8250 Quadriplegia, unspecified: Secondary | ICD-10-CM

## 2021-11-21 DIAGNOSIS — G9589 Other specified diseases of spinal cord: Secondary | ICD-10-CM

## 2021-11-21 DIAGNOSIS — S14105S Unspecified injury at C5 level of cervical spinal cord, sequela: Secondary | ICD-10-CM

## 2021-11-21 NOTE — Patient Outreach (Signed)
  Care Coordination   11/21/2021 Name: Jordan Jensen MRN: [id number] DOB: 04/19/1970   Care Coordination Outreach Attempts:  An unsuccessful telephone outreach was attempted today to offer the patient information about available care coordination services as a benefit of their health plan.   Follow Up Plan:  Additional outreach attempts will be made to offer the patient care coordination information and services.   Encounter Outcome:  No Answer  Care Coordination Interventions Activated:  No   Care Coordination Interventions:  No, not indicated     Cheron Coryell L. Noelle Penner, RN, BSN, CCM [facility] Care Coordinator Office number [phone]

## 2021-11-30 ENCOUNTER — Emergency Department (HOSPITAL_COMMUNITY)
Admission: EM | Admit: 2021-11-30 | Discharge: 2021-11-30 | Disposition: A | Discharge status: Home / Self Care | Payer: Medicare Other | Attending: Emergency Medicine | Admitting: Emergency Medicine

## 2021-11-30 ENCOUNTER — Encounter (HOSPITAL_COMMUNITY): Payer: Self-pay | Admitting: Emergency Medicine

## 2021-11-30 ENCOUNTER — Emergency Department (HOSPITAL_COMMUNITY): Payer: Medicare Other

## 2021-11-30 ENCOUNTER — Other Ambulatory Visit: Payer: Self-pay

## 2021-11-30 DIAGNOSIS — Z7401 Bed confinement status: Secondary | ICD-10-CM | POA: Diagnosis not present

## 2021-11-30 DIAGNOSIS — M1612 Unilateral primary osteoarthritis, left hip: Secondary | ICD-10-CM | POA: Diagnosis not present

## 2021-11-30 DIAGNOSIS — Z7901 Long term (current) use of anticoagulants: Secondary | ICD-10-CM | POA: Diagnosis not present

## 2021-11-30 DIAGNOSIS — M25552 Pain in left hip: Secondary | ICD-10-CM | POA: Diagnosis not present

## 2021-11-30 DIAGNOSIS — Z743 Need for continuous supervision: Secondary | ICD-10-CM | POA: Diagnosis not present

## 2021-11-30 DIAGNOSIS — M545 Low back pain, unspecified: Secondary | ICD-10-CM | POA: Diagnosis not present

## 2021-11-30 DIAGNOSIS — Z91040 Latex allergy status: Secondary | ICD-10-CM | POA: Insufficient documentation

## 2021-11-30 DIAGNOSIS — M549 Dorsalgia, unspecified: Secondary | ICD-10-CM | POA: Diagnosis not present

## 2021-11-30 DIAGNOSIS — R6889 Other general symptoms and signs: Secondary | ICD-10-CM | POA: Diagnosis not present

## 2021-11-30 DIAGNOSIS — T699XXA Effect of reduced temperature, unspecified, initial encounter: Secondary | ICD-10-CM | POA: Diagnosis not present

## 2021-11-30 DIAGNOSIS — R0689 Other abnormalities of breathing: Secondary | ICD-10-CM | POA: Diagnosis not present

## 2021-11-30 DIAGNOSIS — W19XXXA Unspecified fall, initial encounter: Secondary | ICD-10-CM

## 2021-11-30 MED ORDER — OXYCODONE-ACETAMINOPHEN 5-325 MG PO TABS
2.0000 | ORAL_TABLET | Freq: Once | ORAL | Status: AC
  Administered 2021-11-30: 2 via ORAL
  Filled 2021-11-30: qty 2

## 2021-11-30 MED ORDER — OXYCODONE-ACETAMINOPHEN 5-325 MG PO TABS: 1.0000 | ORAL_TABLET | Freq: Four times a day (QID) | ORAL | 0 refills | Status: DC | PRN

## 2021-11-30 NOTE — ED Provider Notes (Signed)
[facility] EMERGENCY DEPARTMENT Provider Note   CSN: [id number] Arrival date & time: 11/30/21  1548     History  Chief Complaint  Patient presents with   Back Pain    Lundon L Ehle is a 51 y.o. male.  51 year old male has had mechanical fall just prior to arrival.  Patient was being transferred from his wheelchair and fell to the ground.  No loss of consciousness.  Complains of pain to his low back as well as left hip.  Has some transient numbness and tingling which is since resolved.  No head or neck pain.  EMS was called and patient transported here.  Patient has history of spinal cord injury and has limited mobility at baseline       Home Medications Prior to Admission medications   Medication Sig Start Date End Date Taking? Authorizing Provider  acetaminophen (TYLENOL) 325 MG tablet Take 2 tablets (650 mg total) by mouth every 6 (six) hours as needed for mild pain (or Fever >/= 101). 02/11/17   Maxie Barb, MD  acetaminophen (TYLENOL) 500 MG tablet Take 500 mg by mouth every 6 (six) hours as needed for moderate pain.    [provider]  albuterol (PROVENTIL) (2.5 MG/3ML) 0.083% nebulizer solution Take 3 mLs (2.5 mg total) by nebulization every 6 (six) hours as needed for wheezing or shortness of breath. 08/31/20   Coralyn Helling, MD  albuterol (VENTOLIN HFA) 108 (90 Base) MCG/ACT inhaler Inhale 2 puffs into the lungs every 6 (six) hours as needed for wheezing or shortness of breath. 08/31/20   Coralyn Helling, MD  apixaban (ELIQUIS) 5 MG TABS tablet Take 1 tablet (5 mg total) by mouth 2 (two) times daily. 11/15/21   Billie Lade, MD  budesonide-formoterol [facility]) 160-4.5 MCG/ACT inhaler Inhale 2 puffs into the lungs in the morning and at bedtime. Brush tongue and rinse mouth afterwards 09/10/21   Erick Blinks, MD  cetirizine (ZYRTEC) 10 MG tablet Take 10 mg by mouth daily as needed for allergies.    [provider]  diclofenac  Sodium (VOLTAREN) 1 % GEL Apply 2 g topically 4 (four) times daily. Patient taking differently: Apply 2 g topically 4 (four) times daily as needed (pain). 05/17/21   Paseda, Baird Kay, FNP  fluticasone (FLONASE) 50 MCG/ACT nasal spray Place 2 sprays into both nostrils daily. 01/28/21   Katha Cabal, DO  metoprolol succinate (TOPROL XL) 25 MG 24 hr tablet Take 1 tablet (25 mg total) by mouth daily. 09/10/21   Erick Blinks, MD  potassium chloride SA (KLOR-CON M) 20 MEQ tablet Take 1 tablet (20 mEq total) by mouth 2 (two) times daily. 11/15/21   Billie Lade, MD  rosuvastatin (CRESTOR) 20 MG tablet Take 1 tablet (20 mg total) by mouth daily. 05/31/21   Donell Beers, FNP  torsemide (DEMADEX) 20 MG tablet Take 2 tablets (40 mg total) by mouth 2 (two) times daily. 11/15/21 11/15/22  Billie Lade, MD  atorvastatin (LIPITOR) 10 MG tablet Take 10 mg by mouth daily.  Patient not taking: Reported on 11/14/2019 11/30/18 12/13/19  [provider]      Allergies    Jardiance [empagliflozin] and Latex    Review of Systems   Review of Systems  All other systems reviewed and are negative.   Physical Exam Updated Vital Signs Pulse 79   Temp (!) 97.4 F (36.3 C) (Oral)   Resp 20   Ht 1.803 m (5\' 11" )  Wt (!) 217.7 kg   SpO2 95%   BMI 66.95 kg/m  Physical Exam Vitals and nursing note reviewed.  Constitutional:      General: He is not in acute distress.    Appearance: Normal appearance. He is well-developed. He is not toxic-appearing.  HENT:     Head: Normocephalic and atraumatic.  Eyes:     General: Lids are normal.     Conjunctiva/sclera: Conjunctivae normal.     Pupils: Pupils are equal, round, and reactive to light.  Neck:     Thyroid: No thyroid mass.     Trachea: No tracheal deviation.  Cardiovascular:     Rate and Rhythm: Normal rate and regular rhythm.     Heart sounds: Normal heart sounds. No murmur heard.    No gallop.  Pulmonary:     Effort: Pulmonary  effort is normal. No respiratory distress.     Breath sounds: Normal breath sounds. No stridor. No decreased breath sounds, wheezing, rhonchi or rales.  Abdominal:     General: There is no distension.     Palpations: Abdomen is soft.     Tenderness: There is no abdominal tenderness. There is no rebound.  Musculoskeletal:        General: No tenderness. Normal range of motion.     Cervical back: Normal range of motion and neck supple.       Back:     Comments: No lower extremity shortening or rotation.  Skin:    General: Skin is warm and dry.     Findings: No abrasion or rash.  Neurological:     Mental Status: He is alert and oriented to person, place, and time. Mental status is at baseline.     GCS: GCS eye subscore is 4. GCS verbal subscore is 5. GCS motor subscore is 6.     Cranial Nerves: No cranial nerve deficit.     Sensory: No sensory deficit.     Motor: Motor function is intact.     Comments: Neurological exam is at baseline per patient  Psychiatric:        Attention and Perception: Attention normal.        Speech: Speech normal.        Behavior: Behavior normal.     ED Results / Procedures / Treatments   Labs (all labs ordered are listed, but only abnormal results are displayed) Labs Reviewed - No data to display  EKG None  Radiology No results found.  Procedures Procedures    Medications Ordered in ED Medications  oxyCODONE-acetaminophen (PERCOCET/ROXICET) 5-325 MG per tablet 2 tablet (has no administration in time range)    ED Course/ Medical Decision Making/ A&P                           Medical Decision Making Amount and/or Complexity of Data Reviewed Radiology: ordered.  Risk Prescription drug management.   Patient treated with Percocet here for pain and feels better.  X-ray of lumbar spine and left hip interpretation showed no acute findings.  Patient has concerns for possible fistula draining at his right gluteal fold and I do not see any  evidence of this.  Instructed him to follow-up with his general surgeon.        Final Clinical Impression(s) / ED Diagnoses Final diagnoses:  None    Rx / DC Orders ED Discharge Orders     None  Lorre Nick, MD 11/30/21 [place]

## 2021-11-30 NOTE — ED Triage Notes (Signed)
Pt bib ems for mechanical fall when transferring from wheelchair to recliner. Pt hit back on arm of chair and then fell to floor. Denies loc, denies head trauma. C/o lower back and left leg pain. VSS with ems.

## 2021-11-30 NOTE — ED Notes (Signed)
Ptar called 

## 2021-12-01 DIAGNOSIS — J4540 Moderate persistent asthma, uncomplicated: Secondary | ICD-10-CM | POA: Diagnosis not present

## 2021-12-02 ENCOUNTER — Ambulatory Visit (INDEPENDENT_AMBULATORY_CARE_PROVIDER_SITE_OTHER): Payer: Medicare Other | Admitting: Internal Medicine

## 2021-12-02 DIAGNOSIS — R300 Dysuria: Secondary | ICD-10-CM

## 2021-12-02 LAB — POCT URINALYSIS DIP (CLINITEK)
Bilirubin, UA: NEGATIVE
Blood, UA: NEGATIVE
Glucose, UA: NEGATIVE mg/dL
Leukocytes, UA: NEGATIVE
Nitrite, UA: NEGATIVE
Spec Grav, UA: 1.015 (ref 1.010–1.025)
Urobilinogen, UA: >=8 E.U./dL — AB
pH, UA: 6 (ref 5.0–8.0)
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
## 2021-12-03 ENCOUNTER — Encounter: Payer: Self-pay | Admitting: Internal Medicine

## 2021-12-03 ENCOUNTER — Telehealth: Payer: Self-pay | Admitting: Internal Medicine

## 2021-12-03 DIAGNOSIS — R300 Dysuria: Secondary | ICD-10-CM | POA: Diagnosis not present

## 2021-12-03 NOTE — Assessment & Plan Note (Addendum)
Patient seen for similar problem in October, but started leftover doxycline at home before UA was collected. UA unremarkable at last visit with exception of trace lysed RBC. His symptoms started Thursday and he was at the beach. He is having dysuria , urine with foul odor, and back pain. Back pain is in center of lower back. He has been drinking water and cranberry juice since then.History of kidney stones. - Obtain UA  Addendum: UA shows elevated urobilinogen. Will have patient scheduled for further evaluation. Differential would hemolytic anemia or liver disease.

## 2021-12-03 NOTE — Telephone Encounter (Signed)
Jordan Jensen will arrange transportation for in person appointment for further evaluation of urobilinogen.

## 2021-12-05 DIAGNOSIS — M6281 Muscle weakness (generalized): Secondary | ICD-10-CM | POA: Diagnosis not present

## 2021-12-05 DIAGNOSIS — J449 Chronic obstructive pulmonary disease, unspecified: Secondary | ICD-10-CM | POA: Diagnosis not present

## 2021-12-05 LAB — URINE CULTURE

## 2021-12-06 ENCOUNTER — Ambulatory Visit: Payer: Self-pay | Admitting: *Deleted

## 2021-12-06 NOTE — Patient Instructions (Signed)
Visit Information  Thank you for taking time to visit with me today. Please don't hesitate to contact me if I can be of assistance to you.   Following are the goals we discussed today:   Goals Addressed               This Visit's Progress     Patient Stated     assistance with getting personal care services [facility]) (pt-stated)   Not on track     Care Coordination Interventions: Collaborated with pcp and [place] as needed  regarding needs for personal care services  Discussed services with patient Reviewed [facility] SW notes Confirmed patient needs further personal care services forms mailed to him [employer] letter with personal care services, list of providers & instructions requested to be mailed to patient       Community resources [facility]) (pt-stated)   Not on track     Care Coordination Interventions: Collaborate with pcp and [employer] regarding outpatient home health services and an electric wheelchair Discussed plans with patient for ongoing care management follow up and provided patient with direct contact information for care management team Unsuccessful outreach 12/06/21 In basket message to pcp staff Merry Proud H with request for aquatic therapy order for the facility in [place]  Reviewed his [place] healthcare coverage to find the Durable medical equipment vendors: [employer] [phone], [employer] [phone], [employer] [phone], [employer] [phone]       Improved symptoms for UTI [facility]) (pt-stated)        Care Coordination Interventions: Discussed importance of adherence to all scheduled medical appointments Counseled on the importance of reporting any/all new or changed pain symptoms or management strategies to pain management provider Assessed for worsening or improving symptoms Encouraged not to sit for long periods of time in his wheelchair and to remain hydrated  Discussed ways of remaining hydrated        Our next appointment is by telephone  on 12/17/21 at 3:30 pm  Please call the care guide team at [phone] if you need to cancel or reschedule your appointment.   If you are experiencing a Mental Health or Behavioral Health Crisis or need someone to talk to, please call the Suicide and Crisis Lifeline: 988 call the [place] National Suicide Prevention Lifeline: 530-528-5087 or TTY: 918-803-1117 TTY 563-283-1479) to talk to a trained counselor call 1-800-273-TALK (toll free, 24 hour hotline) call the [facility]: [phone] call 911   Patient verbalizes understanding of instructions and care plan provided today and agrees to view in MyChart. Active MyChart status and patient understanding of how to access instructions and care plan via MyChart confirmed with patient.     The patient has been provided with contact information for the care management team and has been advised to call with any health related questions or concerns.   Aline Wesche L. Noelle Penner, RN, BSN, CCM [facility] Care Coordinator Office number [phone]

## 2021-12-06 NOTE — Patient Outreach (Addendum)
  Care Coordination   12/06/2021 Name: Jordan Jensen MRN: [id number] DOB: [birth year]   Care Coordination Outreach Attempts:  A second unsuccessful outreach was attempted today to offer the patient with information about available care coordination services as a benefit of their health plan.     Follow Up Plan:  Additional outreach attempts will be made to offer the patient care coordination information and services.   Encounter Outcome:  No Answer Updated case discussion sheet, in basket message to pcp staff ( motorized wheelchair & outpatient services)     Care Coordination Interventions:  No, not indicated    [name] L. Noelle Penner, RN, BSN, CCM [facility] Care Coordinator Office number [phone]

## 2021-12-06 NOTE — Patient Outreach (Signed)
  Care Coordination   Return call from patient Visit Note   12/06/2021 Name: Jordan Jensen MRN: [id number] DOB: [birth year]  Jordan Jensen is a 51 y.o. year old male who sees Jordan Jensen, Jordan Mellow, MD for primary care. I spoke with  Jordan Jensen by phone today.  What matters to the patients health and wellness today?  Follow up on recent ED visit, No diagnosis [place] and foot  Fistula on buttocks is better  - He has been evaluated by MD but fistula site was not clean  Continues to have frequent urinary tract infection (UTI), does not wear pampers or pull ups. Wears briefs and does not complete catheterizations To return to Dr Jordan Jensen for further evaluation   Motorized wheelchair [employer] called him and updated them that they no longer assist with motorized wheelchairs Used Jordan Jensen previous- wheelchair clinic in [place]   He is to start outpatient physical therapy in December 2023 but would like an order for the aquatic center in [place] for water therapy      Goals Addressed               This Visit's Progress     Patient Stated     assistance with getting personal care services [facility]) (pt-stated)   Not on track     Care Coordination Interventions: Collaborated with pcp and Sw as needed  regarding needs for personal care services  Discussed services with patient Reviewed [facility] SW notes Confirmed patient needs further personal care services forms mailed to him [employer] letter with personal care services, list of providers & instructions requested to be mailed to patient       Community resources [facility]) (pt-stated)   Not on track     Care Coordination Interventions: Collaborate with pcp and [employer] regarding outpatient home health services and an electric wheelchair Discussed plans with patient for ongoing care management follow up and provided patient with direct contact information for care management  team Unsuccessful outreach 12/06/21 In basket message to pcp staff Jordan Jensen       Improved symptoms for UTI [facility]) (pt-stated)        Care Coordination Interventions: Discussed importance of adherence to all scheduled medical appointments Counseled on the importance of reporting any/all new or changed pain symptoms or management strategies to pain management provider Assessed for worsening or improving symptoms Encouraged not to sit for long periods of time in his wheelchair and to remain hydrated  Discussed ways of remaining hydrated        SDOH assessments and interventions completed:  No     Care Coordination Interventions:  Yes, provided   Follow up plan: Follow up call scheduled for 12/17/21    Encounter Outcome:  Pt. Visit Completed   Jordan Vinas L. Noelle Penner, RN, BSN, CCM [facility] Care Coordinator Office number [phone]

## 2021-12-08 DIAGNOSIS — G4733 Obstructive sleep apnea (adult) (pediatric): Secondary | ICD-10-CM | POA: Diagnosis not present

## 2021-12-09 ENCOUNTER — Ambulatory Visit: Payer: Self-pay | Admitting: *Deleted

## 2021-12-09 NOTE — Patient Outreach (Signed)
  Care Coordination   12/09/2021  Name: Jordan Jensen MRN: [id number] DOB: 1970-12-16   Care Coordination Outreach Attempts:  An unsuccessful telephone outreach was attempted today to offer the patient information about available care coordination services as a benefit of their health plan. HIPAA compliant message left on voicemail, providing contact information for CSW, encouraging patient to return CSW's call at his earliest convenience.  Follow Up Plan:  Additional outreach attempts will be made to offer the patient care coordination information and services.   Encounter Outcome:  No Answer.   Care Coordination Interventions:  No, not indicated.    Danford Bad, BSW, MSW, LCSW  Licensed Restaurant manager, [employer] food [facility]  Mailing [place] [address] [place], [place] [address] Physical Address[address] [place], [place] [address] Toll Free Main # [phone] Fax # [phone] Cell # [phone] [email or web address]

## 2021-12-10 ENCOUNTER — Other Ambulatory Visit: Payer: Self-pay | Admitting: Internal Medicine

## 2021-12-10 ENCOUNTER — Ambulatory Visit (INDEPENDENT_AMBULATORY_CARE_PROVIDER_SITE_OTHER): Payer: Medicare Other | Admitting: Internal Medicine

## 2021-12-10 ENCOUNTER — Encounter: Payer: Self-pay | Admitting: Internal Medicine

## 2021-12-10 VITALS — BP 143/86 | Temp 97.9°F | Ht 71.0 in | Wt >= 6400 oz

## 2021-12-10 DIAGNOSIS — R7303 Prediabetes: Secondary | ICD-10-CM

## 2021-12-10 DIAGNOSIS — R822 Biliuria: Secondary | ICD-10-CM | POA: Diagnosis not present

## 2021-12-10 DIAGNOSIS — I890 Lymphedema, not elsewhere classified: Secondary | ICD-10-CM | POA: Diagnosis not present

## 2021-12-10 NOTE — Assessment & Plan Note (Addendum)
Hgb A1c 6.1 % in may of this year.  - hemoglobin A1c

## 2021-12-10 NOTE — Assessment & Plan Note (Signed)
Patient had elevated urobilinogen on urinalysis ordered for dysuria. No sign of infection. No history of hemolytic anemia or liver disease. Will complete further lab workup as follows: - CBC with Differential/Platelet - CMP14+EGFR - Bilirubin, fractionated (tot/dir/indir)
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
## 2021-12-10 NOTE — Patient Instructions (Addendum)
Thank you for trusting me with your care. To recap, today we discussed the following:   Urobilinogen elevated  checking these labs today:  - CBC with Differential/Platelet - CMP14+EGFR - Bilirubin, fractionated (tot/dir/indir) -Hemoglobin A1c  I will follow up on results and look over your record. I will share my evaluation with Dr.Dixon. I hope we can help you reach your goal of becoming mobile again.

## 2021-12-12 LAB — SPECIMEN STATUS REPORT

## 2021-12-13 ENCOUNTER — Encounter: Payer: Self-pay | Admitting: Internal Medicine

## 2021-12-13 ENCOUNTER — Other Ambulatory Visit: Payer: Self-pay | Admitting: Internal Medicine

## 2021-12-13 ENCOUNTER — Encounter: Payer: Self-pay | Admitting: *Deleted

## 2021-12-13 DIAGNOSIS — Z993 Dependence on wheelchair: Secondary | ICD-10-CM

## 2021-12-13 DIAGNOSIS — S14105S Unspecified injury at C5 level of cervical spinal cord, sequela: Secondary | ICD-10-CM

## 2021-12-13 DIAGNOSIS — G9589 Other specified diseases of spinal cord: Secondary | ICD-10-CM

## 2021-12-13 DIAGNOSIS — G8250 Quadriplegia, unspecified: Secondary | ICD-10-CM

## 2021-12-13 DIAGNOSIS — R29898 Other symptoms and signs involving the musculoskeletal system: Secondary | ICD-10-CM

## 2021-12-17 ENCOUNTER — Ambulatory Visit: Payer: Self-pay | Admitting: *Deleted

## 2021-12-17 LAB — HEMOGLOBIN A1C
Est. average glucose Bld gHb Est-mCnc: 146 mg/dL
Hgb A1c MFr Bld: 6.7 % — ABNORMAL HIGH (ref 4.8–5.6)

## 2021-12-17 NOTE — Patient Outreach (Signed)
  Care Coordination   Follow Up Visit Note   12/17/2021 Name: Jordan Jensen MRN: [id number] DOB: 1970-07-09  Jordan Jensen is a 51 y.o. year old male who sees Durwin Nora, Lucina Mellow, MD for primary care. I spoke with  Jordan Jensen by phone today.  What matters to the patients health and wellness today?  Starts therapy at the aquatic center vs [facility] Remains interest in motorized wheelchair- Needing an extra duty motorized wheelchair  Interest in durable medical equipment (DME) from [employer]   Goals Addressed               This Visit's Progress     Patient Stated     Community resources [facility]) (pt-stated)   Not on track     Care Coordination Interventions: Collaborate with pcp, [facility] outpatient rehab and [employer] [phone]) regarding canceling 12/18/21 outpatient rehab services and initiating referral for an electric/motorized wheelchair Discussed plans with patient for ongoing care management follow up and provided patient with direct contact information for care management team Conference call with patient to [employer] [phone] Patient encouraged to ask questions Discussed the type of wheelchair per patient height and weight with Beth Discussed the estimated length of time for processing and receiving the motorized wheelchair Faxed the OT wheelchair evaluation, demographics, pcp note & PT notes to Beth ([employer]) at [phone]         SDOH assessments and interventions completed:  No     Care Coordination Interventions:  Yes, provided   Follow up plan: Follow up call scheduled for 12/31/21    Encounter Outcome:  Pt. Visit Completed   Cheral Cappucci L. Noelle Penner, RN, BSN, CCM [facility] Care Coordinator Office number [phone]

## 2021-12-17 NOTE — Patient Instructions (Addendum)
Visit Information  Thank you for taking time to visit with me today. Please don't hesitate to contact me if I can be of assistance to you.   Following are the goals we discussed today:   Goals Addressed               This Visit's Progress     Patient Stated     Community resources [facility]) (pt-stated)   Not on track     Care Coordination Interventions: Collaborate with pcp, [facility] re[facility] and [employer] [phone]) regarding canceling 12/18/21 outpatient rehab services and initiating referral for an electric/motorized wheelchair Discussed plans with patient for ongoing care management follow up and provided patient with direct contact information for care management team Conference call with patient to [employer] [phone] Patient encouraged to ask questions Discussed the type of wheelchair per patient height and weight with Beth Discussed the estimated length of time for processing and receiving the motorized wheelchair Faxed the OT wheelchair evaluation, demographics, pcp note & PT notes to Beth ([employer]) at [phone]         Our next appointment is by telephone on 12/28/21 at 2:30 pm  Please call the care guide team at [phone] if you need to cancel or reschedule your appointment.   If you are experiencing a Mental Health or Behavioral Health Crisis or need someone to talk to, please call the Suicide and Crisis Lifeline: 988 call the [place] National Suicide Prevention Lifeline: 781-658-1844 or TTY: (641)315-0689 TTY 931-761-7513) to talk to a trained counselor call 1-800-273-TALK (toll free, 24 hour hotline) call the [facility]: 564-454-2549 call 911   Patient verbalizes understanding of instructions and care plan provided today and agrees to view in MyChart. Active MyChart status and patient understanding of how to access instructions and care plan via MyChart confirmed with patient.     The patient has been provided with  contact information for the care management team and has been advised to call with any health related questions or concerns.   Bartow Zylstra L. Noelle Penner, RN, BSN, CCM [facility] Care Coordinator Office number [phone]

## 2021-12-18 ENCOUNTER — Ambulatory Visit (HOSPITAL_COMMUNITY): Payer: Medicare Other | Admitting: Physical Therapy

## 2021-12-19 ENCOUNTER — Telehealth: Payer: Self-pay | Admitting: Internal Medicine

## 2021-12-19 LAB — CBC WITH DIFFERENTIAL/PLATELET
Basophils Absolute: 0.1 10*3/uL (ref 0.0–0.2)
Basos: 0 %
EOS (ABSOLUTE): 0.3 10*3/uL (ref 0.0–0.4)
Eos: 2 %
Hematocrit: 43.7 % (ref 37.5–51.0)
Hemoglobin: 14.1 g/dL (ref 13.0–17.7)
Immature Grans (Abs): 0 10*3/uL (ref 0.0–0.1)
Immature Granulocytes: 0 %
Lymphocytes Absolute: 2.2 10*3/uL (ref 0.7–3.1)
Lymphs: 15 %
MCH: 28.7 pg (ref 26.6–33.0)
MCHC: 32.3 g/dL (ref 31.5–35.7)
MCV: 89 fL (ref 79–97)
Monocytes Absolute: 0.7 10*3/uL (ref 0.1–0.9)
Monocytes: 5 %
Neutrophils Absolute: 11.5 10*3/uL — ABNORMAL HIGH (ref 1.4–7.0)
Neutrophils: 78 %
Platelets: 177 10*3/uL (ref 150–450)
RBC: 4.92 x10E6/uL (ref 4.14–5.80)
RDW: 13.5 % (ref 11.6–15.4)
WBC: 14.7 10*3/uL — ABNORMAL HIGH (ref 3.4–10.8)

## 2021-12-19 LAB — CMP14+EGFR
ALT: 14 IU/L (ref 0–44)
AST: 18 IU/L (ref 0–40)
Albumin/Globulin Ratio: 1.2 (ref 1.2–2.2)
Albumin: 4.1 g/dL (ref 3.8–4.9)
Alkaline Phosphatase: 76 IU/L (ref 44–121)
BUN/Creatinine Ratio: 8 — ABNORMAL LOW (ref 9–20)
BUN: 7 mg/dL (ref 6–24)
Bilirubin Total: 0.5 mg/dL (ref 0.0–1.2)
CO2: 28 mmol/L (ref 20–29)
Calcium: 9.3 mg/dL (ref 8.7–10.2)
Chloride: 99 mmol/L (ref 96–106)
Creatinine, Ser: 0.89 mg/dL (ref 0.76–1.27)
Globulin, Total: 3.4 g/dL (ref 1.5–4.5)
Glucose: 129 mg/dL — ABNORMAL HIGH (ref 70–99)
Potassium: 4.6 mmol/L (ref 3.5–5.2)
Sodium: 140 mmol/L (ref 134–144)
Total Protein: 7.5 g/dL (ref 6.0–8.5)
eGFR: 103 mL/min/{1.73_m2} (ref >59)

## 2021-12-19 LAB — BILIRUBIN, FRACTIONATED(TOT/DIR/INDIR)
Bilirubin, Direct: 0.19 mg/dL (ref 0.00–0.40)
Bilirubin, Indirect: 0.31 mg/dL (ref 0.10–0.80)

## 2021-12-19 NOTE — Telephone Encounter (Signed)
Patient called left voicemail asking can nurse return call about his referral for physical therapy, he received a phone from [place] area and it was his understanding for physical therapy was at another facility. Please call patient back at [phone].

## 2021-12-20 ENCOUNTER — Ambulatory Visit: Payer: Medicare Other | Admitting: Internal Medicine

## 2021-12-20 DIAGNOSIS — G4733 Obstructive sleep apnea (adult) (pediatric): Secondary | ICD-10-CM | POA: Diagnosis not present

## 2021-12-20 DIAGNOSIS — J4540 Moderate persistent asthma, uncomplicated: Secondary | ICD-10-CM | POA: Diagnosis not present

## 2021-12-20 NOTE — Telephone Encounter (Signed)
Returned patient call.

## 2021-12-23 ENCOUNTER — Ambulatory Visit: Payer: Self-pay | Admitting: *Deleted

## 2021-12-23 NOTE — Patient Outreach (Signed)
  Care Coordination   12/23/2021  Name: LONI ABDON MRN: [id number] DOB: 01-Mar-1970   Care Coordination Outreach Attempts:  An unsuccessful telephone outreach was attempted today to offer the patient information about available care coordination services as a benefit of their health plan. HIPAA compliant message left on voicemail, providing contact information for CSW, encouraging patient to return CSW's call at his earliest convenience.  Follow Up Plan:  Additional outreach attempts will be made to offer the patient care coordination information and services.   Encounter Outcome:  No Answer.   Care Coordination Interventions:  No, not indicated.    Danford Bad, BSW, MSW, LCSW  Licensed Restaurant manager, fast food [facility]  Mailing [place] [address] [place], [place] [address] Physical Address[address], [place], [place] [address] Toll Free Main # [phone] Fax # [phone] Cell # [phone] [email or web address]

## 2021-12-24 ENCOUNTER — Ambulatory Visit: Payer: Self-pay | Admitting: *Deleted

## 2021-12-24 NOTE — Patient Outreach (Signed)
  Care Coordination   12/24/2021  Name: Jordan Jensen MRN: [id number] DOB: 01-31-1970   Care Coordination Outreach Attempts:  An unsuccessful telephone outreach was attempted today to offer the patient information about available care coordination services as a benefit of their health plan. HIPAA compliant message left on voicemail, providing contact information for CSW, encouraging patient to return CSW's call at his earliest convenience.   Follow Up Plan:  Additional outreach attempts will be made to offer the patient care coordination information and services.    Encounter Outcome:  No Answer.    Care Coordination Interventions:  No, not indicated.     Danford Bad, BSW, MSW, LCSW  Licensed Restaurant manager, fast food [facility]  Mailing [place] [address] [place], [place] [address] Physical Address[address] [place], [place] [address] Toll Free Main # [phone] Fax # [phone] Cell # [phone] [email or web address]

## 2021-12-25 ENCOUNTER — Ambulatory Visit (INDEPENDENT_AMBULATORY_CARE_PROVIDER_SITE_OTHER): Payer: Medicare Other | Admitting: Family Medicine

## 2021-12-25 ENCOUNTER — Encounter: Payer: Self-pay | Admitting: Family Medicine

## 2021-12-25 DIAGNOSIS — J209 Acute bronchitis, unspecified: Secondary | ICD-10-CM

## 2021-12-25 DIAGNOSIS — J4530 Mild persistent asthma, uncomplicated: Secondary | ICD-10-CM | POA: Diagnosis not present

## 2021-12-25 MED ORDER — PREDNISONE 5 MG PO TABS: 5.0000 mg | ORAL_TABLET | Freq: Two times a day (BID) | ORAL | 0 refills | Status: AC

## 2021-12-25 MED ORDER — AZITHROMYCIN 250 MG PO TABS: ORAL_TABLET | ORAL | 0 refills | Status: AC

## 2021-12-25 NOTE — Patient Instructions (Addendum)
F/U as before , call if you need to be seen sooner  You are treated for asthma flare and acute bronchitis. Prednisone and azithromycin are prescribed, take entire 5 day course of each  If you have increased breathing difficulty or fever that will not break , you need to go to the Emergency   Thanks for choosing [facility], we consider it a privelige to serve you.

## 2021-12-25 NOTE — Progress Notes (Signed)
Virtual Visit via Telephone Note  I connected with Jordan Jensen on 12/25/21 at  3:40 PM EST by telephone and verified that I am speaking with the correct person using two identifiers.  Location: Patient: home Provider: office   I discussed the limitations, risks, security and privacy concerns of performing an evaluation and management service by telephone and the availability of in person appointments. I also discussed with the patient that there may be a patient responsible charge related to this service. The patient expressed understanding and agreed to proceed.   History of Present Illness: Cough  started on 12/23/2021 coughing up sputum which at times is yellow C/o increased wheezing and shortness of breath, denies fever , chills, sinus pressure o drainage   Observations/Objective: Good communication with no confusion and intact memory. Cough noted during visit , able to speak in sentences with no SOB   Assessment and Plan:  Mild persistent allergic asthma Acute flare, prednisone x 5 days prescribed advised ED if symptoms worsen  Acute bronchitis Z pack prescribed  Follow Up Instructions:    I discussed the assessment and treatment plan with the patient. The patient was provided an opportunity to ask questions and all were answered. The patient agreed with the plan and demonstrated an understanding of the instructions.   The patient was advised to call back or seek an in-person evaluation if the symptoms worsen or if the condition fails to improve as anticipated.  I provided 12 minutes of non-face-to-face time during this encounter.   Syliva Overman, MD

## 2021-12-27 ENCOUNTER — Encounter: Payer: Self-pay | Admitting: Family Medicine

## 2021-12-27 DIAGNOSIS — J209 Acute bronchitis, unspecified: Secondary | ICD-10-CM | POA: Insufficient documentation

## 2021-12-27 NOTE — Assessment & Plan Note (Signed)
Acute flare, prednisone x 5 days prescribed advised ED if symptoms worsen

## 2021-12-27 NOTE — Assessment & Plan Note (Signed)
Z pack prescribed 

## 2021-12-31 ENCOUNTER — Ambulatory Visit: Payer: Self-pay | Admitting: *Deleted

## 2021-12-31 DIAGNOSIS — J4540 Moderate persistent asthma, uncomplicated: Secondary | ICD-10-CM | POA: Diagnosis not present

## 2021-12-31 NOTE — Patient Outreach (Signed)
  Care Coordination   01/01/2022- Late entry for 12/31/21 1637 Name: Jordan Jensen MRN: [id number] DOB: [birth year]   Care Coordination Outreach Attempts:  An unsuccessful telephone outreach was attempted today to offer the patient information about available care coordination services as a benefit of their health plan.   Follow Up Plan:  Additional outreach attempts will be made to offer the patient care coordination information and services.   Encounter Outcome:  No Answer   Care Coordination Interventions:  No, not indicated    Omario Ander L. Noelle Penner, RN, BSN, CCM [facility] Care Coordinator Office number [phone]

## 2022-01-01 ENCOUNTER — Other Ambulatory Visit: Payer: Self-pay

## 2022-01-01 ENCOUNTER — Ambulatory Visit: Payer: Medicare Other | Attending: Internal Medicine

## 2022-01-01 DIAGNOSIS — S14105S Unspecified injury at C5 level of cervical spinal cord, sequela: Secondary | ICD-10-CM | POA: Diagnosis not present

## 2022-01-01 DIAGNOSIS — Z993 Dependence on wheelchair: Secondary | ICD-10-CM | POA: Insufficient documentation

## 2022-01-01 DIAGNOSIS — R2689 Other abnormalities of gait and mobility: Secondary | ICD-10-CM | POA: Insufficient documentation

## 2022-01-01 DIAGNOSIS — R29898 Other symptoms and signs involving the musculoskeletal system: Secondary | ICD-10-CM | POA: Insufficient documentation

## 2022-01-01 DIAGNOSIS — M6281 Muscle weakness (generalized): Secondary | ICD-10-CM | POA: Insufficient documentation

## 2022-01-01 DIAGNOSIS — M545 Low back pain, unspecified: Secondary | ICD-10-CM | POA: Insufficient documentation

## 2022-01-01 DIAGNOSIS — G8250 Quadriplegia, unspecified: Secondary | ICD-10-CM | POA: Insufficient documentation

## 2022-01-01 DIAGNOSIS — R2681 Unsteadiness on feet: Secondary | ICD-10-CM | POA: Insufficient documentation

## 2022-01-01 DIAGNOSIS — G9589 Other specified diseases of spinal cord: Secondary | ICD-10-CM | POA: Insufficient documentation
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
## 2022-01-03 ENCOUNTER — Ambulatory Visit: Payer: Self-pay | Admitting: *Deleted

## 2022-01-03 NOTE — Patient Outreach (Signed)
  Care Coordination   01/03/2022 Name: Jordan Jensen MRN: [id number] DOB: 1970/09/24   Care Coordination Outreach Attempts:  A second unsuccessful outreach was attempted today to offer the patient with information about available care coordination services as a benefit of their health plan.     Follow Up Plan:  Additional outreach attempts will be made to offer the patient care coordination information and services.   Encounter Outcome:  No Answer   Care Coordination Interventions:  No, not indicated     Zelie Asbill L. Noelle Penner, RN, BSN, CCM [facility] Care Coordinator Office number [phone]

## 2022-01-03 NOTE — Patient Outreach (Signed)
  Care Coordination   Return call from patient/follow up  Visit Note   01/03/2022 Name: Jordan Jensen MRN: [id number] DOB: [birth year]  Jordan Jensen is a 51 y.o. year old male who sees Durwin Nora, Lucina Mellow, MD for primary care. I spoke with  Jordan Jensen by phone today.  What matters to the patients health and wellness today?  Out patient therapies  Jordan Jensen returned a call to RN CM  He confirms his orientation to outpatient rehab and feels he will enjoy his assigned therapist, pending aquatic therapy Voiced understanding when RN CM reviewed that RN CM note related to pending motorized wheelchair (12/17/21 note) was sent to Jordan Jensen  He denies any immediate needs  He agrees to further outreaches in 2024    Goals Addressed               This Visit's Progress     Patient Stated     Community resources [facility]) (Jensen-stated)   On track     Care Coordination Interventions: Second unsuccessful outreach to patient Pending return call. Case closure pending for 3 unsuccessful RN CM outreaches Confirmed patient has started outpatient therapy, voice interest in aquatic therapy Sent RN CM note from 12/17/21 to his outpatient Jensen staff Jordan Jensen to indicate patient interest in a motorized wheelchair also         SDOH assessments and interventions completed:  No     Care Coordination Interventions:  Yes, provided   Follow up plan: Follow up call scheduled for 02/27/22    Encounter Outcome:  Jensen. Visit Completed   Amilyah Nack L. Noelle Penner, RN, BSN, CCM [facility] Care Coordinator Office number [phone]

## 2022-01-03 NOTE — Patient Instructions (Addendum)
Visit Information  Thank you for taking time to visit with me today. Please don't hesitate to contact me if I can be of assistance to you.   Following are the goals we discussed today:   Goals Addressed               This Visit's Progress     Patient Stated     Community resources [facility]) (pt-stated)   On track     Care Coordination Interventions: Second unsuccessful outreach to patient Pending return call. Case closure pending for 3 unsuccessful RN CM outreaches Confirmed patient has started outpatient therapy, voice interest in aquatic therapy Sent RN CM note from 12/17/21 to his outpatient PT staff K Halpin to indicate patient interest in a motorized wheelchair also         Our next appointment is by telephone on 02/27/22 at 3:30 pm  Please call the care guide team at [phone] if you need to cancel or reschedule your appointment.   If you are experiencing a Mental Health or Behavioral Health Crisis or need someone to talk to, please call the Suicide and Crisis Lifeline: 988 call the [place] National Suicide Prevention Lifeline: 505-826-8533 or TTY: (424)834-3660 TTY 757-385-3983) to talk to a trained counselor call 1-800-273-TALK (toll free, 24 hour hotline) call the [facility]: (831)743-5104 call 911   Patient verbalizes understanding of instructions and care plan provided today and agrees to view in MyChart. Active MyChart status and patient understanding of how to access instructions and care plan via MyChart confirmed with patient.     The patient has been provided with contact information for the care management team and has been advised to call with any health related questions or concerns.    Jordan Jensen L. Noelle Penner, RN, BSN, CCM [facility] Care Coordinator Office number [phone]

## 2022-01-05 DIAGNOSIS — J449 Chronic obstructive pulmonary disease, unspecified: Secondary | ICD-10-CM | POA: Diagnosis not present

## 2022-01-05 DIAGNOSIS — M6281 Muscle weakness (generalized): Secondary | ICD-10-CM | POA: Diagnosis not present

## 2022-01-08 ENCOUNTER — Ambulatory Visit: Payer: 59

## 2022-01-09 ENCOUNTER — Encounter: Payer: Self-pay | Admitting: Internal Medicine

## 2022-01-09 ENCOUNTER — Ambulatory Visit (INDEPENDENT_AMBULATORY_CARE_PROVIDER_SITE_OTHER): Payer: Medicare Other | Admitting: Internal Medicine

## 2022-01-09 ENCOUNTER — Ambulatory Visit: Payer: Self-pay | Admitting: *Deleted

## 2022-01-09 VITALS — BP 150/87 | HR 75 | Ht 71.0 in | Wt >= 6400 oz

## 2022-01-09 DIAGNOSIS — I10 Essential (primary) hypertension: Secondary | ICD-10-CM

## 2022-01-09 DIAGNOSIS — G4733 Obstructive sleep apnea (adult) (pediatric): Secondary | ICD-10-CM

## 2022-01-09 DIAGNOSIS — I5020 Unspecified systolic (congestive) heart failure: Secondary | ICD-10-CM

## 2022-01-09 DIAGNOSIS — E119 Type 2 diabetes mellitus without complications: Secondary | ICD-10-CM | POA: Diagnosis not present

## 2022-01-09 DIAGNOSIS — I82432 Acute embolism and thrombosis of left popliteal vein: Secondary | ICD-10-CM | POA: Diagnosis not present

## 2022-01-09 NOTE — Patient Instructions (Signed)
It was a pleasure to see you today.  Thank you for giving [place] the opportunity to be involved in your care.  Below is a brief recap of your visit and next steps.  We will plan to see you again in 4 weeks.  Summary Stop Eliquis I recommend contacting your cardiologist to follow up on the status of your referral to the advanced heart failure clinic. We will follow up in 4 weeks

## 2022-01-09 NOTE — Patient Outreach (Signed)
  Care Coordination   01/09/2022  Name: WITTEN CERTAIN MRN: [id number] DOB: [birth year]   Care Coordination Outreach Attempts:  An unsuccessful telephone outreach was attempted today to offer the patient information about available care coordination services as a benefit of their health plan. HIPAA compliant message left on voicemail, providing contact information for CSW, encouraging patient to return CSW's call at his earliest convenience.   Follow Up Plan:  Additional outreach attempts will be made to offer the patient care coordination information and services.   Encounter Outcome:  No Answer.   Care Coordination Interventions:  No, not indicated.    Nat Christen, BSW, MSW, LCSW  Licensed Education officer, [employer] [facility]  Mailing [place] [address], [place], [place] [address] Physical Address-[address] [place], [place] [address] Toll Free Main # [phone] Fax # [phone] Cell # [phone] [name]
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
## 2022-01-10 ENCOUNTER — Telehealth: Payer: Self-pay | Admitting: Internal Medicine

## 2022-01-10 ENCOUNTER — Other Ambulatory Visit: Payer: Self-pay

## 2022-01-10 ENCOUNTER — Encounter: Payer: Medicare Other | Admitting: *Deleted

## 2022-01-10 ENCOUNTER — Telehealth: Payer: Self-pay | Admitting: *Deleted

## 2022-01-10 DIAGNOSIS — G8250 Quadriplegia, unspecified: Secondary | ICD-10-CM

## 2022-01-10 NOTE — Progress Notes (Signed)
  Care Coordination Note  01/10/2022 Name: TRYSON LUMLEY MRN: [id number] DOB: [birth year]  Kristopher Oppenheim Kluever is a 52 y.o. year old male who is a primary care patient of Johnette Abraham, MD and is actively engaged with the care management team. I reached out to [name] by phone today to assist with re-scheduling a follow up visit with the Licensed Clinical Social Worker  Follow up plan: Unsuccessful telephone outreach attempt made. A HIPAA compliant phone message was left for the patient providing contact information and requesting a return call.   [place]  Direct Dial: [phone]

## 2022-01-10 NOTE — Telephone Encounter (Signed)
Pt return call °

## 2022-01-10 NOTE — Telephone Encounter (Signed)
Rx wrote. Byng

## 2022-01-10 NOTE — Telephone Encounter (Signed)
Pt called stating that is needing a new hoiter lift. States his will no longer charge. States he was advised by [facility] to get a new order since they can no longer replace it.

## 2022-01-10 NOTE — Progress Notes (Signed)
  Care Coordination Note  01/10/2022 Name: SLEVIN GUNBY MRN: [id number] DOB: 1970/11/20  Kristopher Oppenheim Hinderliter is a 52 y.o. year old male who is a primary care patient of Johnette Abraham, MD and is actively engaged with the care management team. I reached out to [name] by phone today to assist with re-scheduling a follow up visit with the Licensed Clinical Social Worker  Follow up plan: Telephone appointment with care management team member scheduled for: 01/20/22  [place]  Direct Dial: [phone]

## 2022-01-13 ENCOUNTER — Ambulatory Visit (INDEPENDENT_AMBULATORY_CARE_PROVIDER_SITE_OTHER): Payer: 59 | Admitting: Internal Medicine

## 2022-01-13 ENCOUNTER — Encounter: Payer: Self-pay | Admitting: Internal Medicine

## 2022-01-13 DIAGNOSIS — Z0000 Encounter for general adult medical examination without abnormal findings: Secondary | ICD-10-CM

## 2022-01-13 DIAGNOSIS — M6281 Muscle weakness (generalized): Secondary | ICD-10-CM | POA: Diagnosis not present

## 2022-01-13 DIAGNOSIS — J449 Chronic obstructive pulmonary disease, unspecified: Secondary | ICD-10-CM | POA: Diagnosis not present

## 2022-01-13 DIAGNOSIS — G8250 Quadriplegia, unspecified: Secondary | ICD-10-CM | POA: Diagnosis not present
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
## 2022-01-13 NOTE — Patient Instructions (Addendum)
  Mr. Stangl , Thank you for taking time to come for your Medicare Wellness Visit. I appreciate your ongoing commitment to your health goals. Please review the following plan we discussed and let me know if I can assist you in the future.   These are the goals we discussed: Patient is going to avoid sugar drinks. He is working on weight loss.   This is a list of the screening recommended for you and due dates:  Health Maintenance  Topic Date Due   Medicare Annual Wellness Visit  Never done   Yearly kidney health urinalysis for diabetes  Never done   Zoster (Shingles) Vaccine (2 of 2) 11/07/2020   COVID-19 Vaccine (4 - 2023-24 season) 09/06/2021   Yearly kidney function blood test for diabetes  12/11/2022   DTaP/Tdap/Td vaccine (2 - Td or Tdap) 01/25/2025   Colon Cancer Screening  08/07/2031   Flu Shot  Completed   Hepatitis C Screening: USPSTF Recommendation to screen - Ages 18-79 yo.  Completed   HIV Screening  Completed   HPV Vaccine  Aged Out

## 2022-01-14 DIAGNOSIS — E119 Type 2 diabetes mellitus without complications: Secondary | ICD-10-CM | POA: Insufficient documentation

## 2022-01-14 MED ORDER — MONTELUKAST SODIUM 10 MG PO TABS: 10.0000 mg | ORAL_TABLET | Freq: Every day | ORAL | 3 refills | Status: DC

## 2022-01-14 NOTE — Assessment & Plan Note (Signed)
New diagnosis.  A1c 6.7 last month.  For now he has declined starting metformin. We will re-visit this discussion at follow up in 1 month. Consider addition of SGLT-2 therapy, particularly in the setting of HFrEF.

## 2022-01-14 NOTE — Assessment & Plan Note (Signed)
DVT left lower extremity diagnosed in the late August.  Provoked DVT per previous documentation.  He has completed 3 months of Eliquis. We will stop anticoagulation today.  Plan for follow-up in 1 month.

## 2022-01-14 NOTE — Assessment & Plan Note (Signed)
He was referred to the advanced HF clinic in October but does not currently have an appointment scheduled.  I have recommended that he contact his cardiologist to follow-up on the status of this referral.

## 2022-01-15 ENCOUNTER — Ambulatory Visit: Payer: 59 | Attending: Internal Medicine

## 2022-01-15 DIAGNOSIS — R2681 Unsteadiness on feet: Secondary | ICD-10-CM | POA: Diagnosis not present

## 2022-01-15 DIAGNOSIS — M545 Low back pain, unspecified: Secondary | ICD-10-CM | POA: Diagnosis not present

## 2022-01-15 DIAGNOSIS — R2689 Other abnormalities of gait and mobility: Secondary | ICD-10-CM

## 2022-01-15 DIAGNOSIS — M6281 Muscle weakness (generalized): Secondary | ICD-10-CM | POA: Diagnosis not present
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
## 2022-01-20 ENCOUNTER — Encounter: Payer: Medicare Other | Admitting: *Deleted

## 2022-01-20 ENCOUNTER — Ambulatory Visit: Payer: Self-pay | Admitting: *Deleted

## 2022-01-20 DIAGNOSIS — J4540 Moderate persistent asthma, uncomplicated: Secondary | ICD-10-CM | POA: Diagnosis not present

## 2022-01-20 DIAGNOSIS — G4733 Obstructive sleep apnea (adult) (pediatric): Secondary | ICD-10-CM | POA: Diagnosis not present

## 2022-01-20 NOTE — Patient Outreach (Signed)
  Care Coordination   01/20/2022  Name: Jordan Jensen MRN: [id number] DOB: 27-Oct-1970   Care Coordination Outreach Attempts:  An unsuccessful telephone outreach was attempted today to offer the patient information about available care coordination services as a benefit of their health plan. HIPAA compliant message left on voicemail, providing contact information for CSW, encouraging patient to return CSW's call at his earliest convenience.   Follow Up Plan:  Additional outreach attempts will be made to offer the patient care coordination information and services.    Encounter Outcome:  No Answer.    Care Coordination Interventions:  No, not indicated.     Nat Christen, BSW, MSW, LCSW  Licensed Education officer, [facility]  Mailing [place] [address] [place], [place] [address] Physical Address-[address], [place], [place] [address] Toll Free Main # [phone] Fax # [phone] Cell # [phone] [name]

## 2022-01-21 ENCOUNTER — Encounter: Payer: Medicare Other | Admitting: *Deleted

## 2022-01-22 ENCOUNTER — Ambulatory Visit: Payer: 59

## 2022-01-29 ENCOUNTER — Ambulatory Visit: Payer: 59

## 2022-01-31 DIAGNOSIS — J4540 Moderate persistent asthma, uncomplicated: Secondary | ICD-10-CM | POA: Diagnosis not present

## 2022-02-03 ENCOUNTER — Encounter (HOSPITAL_COMMUNITY): Payer: Self-pay | Admitting: Cardiology

## 2022-02-03 ENCOUNTER — Ambulatory Visit (HOSPITAL_COMMUNITY)
Admission: RE | Admit: 2022-02-03 | Discharge: 2022-02-03 | Disposition: A | Discharge status: Home / Self Care | Payer: 59 | Source: Ambulatory Visit | Attending: Cardiology | Admitting: Cardiology

## 2022-02-03 VITALS — BP 92/50 | HR 53

## 2022-02-03 DIAGNOSIS — G8220 Paraplegia, unspecified: Secondary | ICD-10-CM

## 2022-02-03 DIAGNOSIS — E669 Obesity, unspecified: Secondary | ICD-10-CM | POA: Diagnosis not present

## 2022-02-03 DIAGNOSIS — I824Y9 Acute embolism and thrombosis of unspecified deep veins of unspecified proximal lower extremity: Secondary | ICD-10-CM

## 2022-02-03 DIAGNOSIS — I5022 Chronic systolic (congestive) heart failure: Secondary | ICD-10-CM | POA: Diagnosis not present

## 2022-02-03 LAB — BASIC METABOLIC PANEL
Anion gap: 12 (ref 5–15)
BUN: 8 mg/dL (ref 6–20)
CO2: 30 mmol/L (ref 22–32)
Calcium: 9.2 mg/dL (ref 8.9–10.3)
Chloride: 100 mmol/L (ref 98–111)
Creatinine, Ser: 0.88 mg/dL (ref 0.61–1.24)
GFR, Estimated: >60 mL/min (ref >60)
Glucose, Bld: 99 mg/dL (ref 70–99)
Potassium: 4.2 mmol/L (ref 3.5–5.1)
Sodium: 142 mmol/L (ref 135–145)

## 2022-02-03 LAB — BRAIN NATRIURETIC PEPTIDE: B Natriuretic Peptide: 143.2 pg/mL — ABNORMAL HIGH (ref 0.0–100.0)

## 2022-02-03 MED ORDER — SPIRONOLACTONE 25 MG PO TABS: 25.0000 mg | ORAL_TABLET | Freq: Every day | ORAL | 3 refills | Status: DC
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
## 2022-02-03 NOTE — Patient Instructions (Signed)
START Spironolactone 25 mg daily.  INCREASE Torsemide to 60mg  daily for 5 days, then return to your normal dose.  Labs done today, your results will be available in MyChart, we will contact you for abnormal readings.  Your physician recommends that you schedule a follow-up appointment in: 1 month with the nurse Practitioner and with Dr. Daniel Nones in 3 months ( April 2024) ** please call the office in February to arrange your follow up appointment.**  If you have any questions or concerns before your next appointment please send [place] a message through [place] or call our office at [phone].    TO LEAVE A MESSAGE FOR THE NURSE SELECT OPTION 2, PLEASE LEAVE A MESSAGE INCLUDING: YOUR NAME DATE OF BIRTH CALL BACK NUMBER REASON FOR CALL**this is important as we prioritize the call backs  YOU WILL RECEIVE A CALL BACK THE SAME DAY AS LONG AS YOU CALL BEFORE 4:00 PM  At the [facility], you and your health needs are our priority. As part of our continuing mission to provide you with exceptional heart care, we have created designated Provider Care Teams. These Care Teams include your primary Cardiologist (physician) and Advanced Practice Providers (APPs- Physician Assistants and Nurse Practitioners) who all work together to provide you with the care you need, when you need it.   You may see any of the following providers on your designated Care Team at your next follow up: Dr Glori Bickers Dr Loralie Champagne Dr. Roxana Hires, NP Lyda Jester, [facility] [place], [place] Forestine Na, NP Audry Riles, PharmD   Please be sure to bring in all your medications bottles to every appointment.    Thank you for choosing [facility]

## 2022-02-05 ENCOUNTER — Ambulatory Visit: Payer: 59

## 2022-02-05 DIAGNOSIS — M545 Low back pain, unspecified: Secondary | ICD-10-CM | POA: Diagnosis not present

## 2022-02-05 DIAGNOSIS — M6281 Muscle weakness (generalized): Secondary | ICD-10-CM

## 2022-02-05 DIAGNOSIS — R2689 Other abnormalities of gait and mobility: Secondary | ICD-10-CM | POA: Diagnosis not present

## 2022-02-05 DIAGNOSIS — J449 Chronic obstructive pulmonary disease, unspecified: Secondary | ICD-10-CM | POA: Diagnosis not present

## 2022-02-05 DIAGNOSIS — R2681 Unsteadiness on feet: Secondary | ICD-10-CM | POA: Diagnosis not present
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
## 2022-02-07 ENCOUNTER — Ambulatory Visit: Payer: Medicare Other | Admitting: Internal Medicine

## 2022-02-12 ENCOUNTER — Ambulatory Visit: Payer: 59 | Attending: Internal Medicine

## 2022-02-12 DIAGNOSIS — R2681 Unsteadiness on feet: Secondary | ICD-10-CM | POA: Diagnosis not present

## 2022-02-12 DIAGNOSIS — M6281 Muscle weakness (generalized): Secondary | ICD-10-CM | POA: Insufficient documentation

## 2022-02-12 DIAGNOSIS — R2689 Other abnormalities of gait and mobility: Secondary | ICD-10-CM | POA: Insufficient documentation

## 2022-02-12 DIAGNOSIS — M545 Low back pain, unspecified: Secondary | ICD-10-CM | POA: Diagnosis not present
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
## 2022-02-19 ENCOUNTER — Ambulatory Visit: Payer: 59

## 2022-02-20 DIAGNOSIS — G4733 Obstructive sleep apnea (adult) (pediatric): Secondary | ICD-10-CM | POA: Diagnosis not present

## 2022-02-20 DIAGNOSIS — J4540 Moderate persistent asthma, uncomplicated: Secondary | ICD-10-CM | POA: Diagnosis not present

## 2022-02-21 ENCOUNTER — Encounter: Payer: Self-pay | Admitting: Internal Medicine

## 2022-02-21 ENCOUNTER — Ambulatory Visit (INDEPENDENT_AMBULATORY_CARE_PROVIDER_SITE_OTHER): Payer: 59 | Admitting: Internal Medicine

## 2022-02-21 VITALS — BP 110/70 | HR 73 | Ht 71.0 in | Wt >= 6400 oz

## 2022-02-21 DIAGNOSIS — G8250 Quadriplegia, unspecified: Secondary | ICD-10-CM | POA: Diagnosis not present

## 2022-02-21 DIAGNOSIS — M25512 Pain in left shoulder: Secondary | ICD-10-CM

## 2022-02-21 DIAGNOSIS — M79605 Pain in left leg: Secondary | ICD-10-CM | POA: Diagnosis not present

## 2022-02-21 DIAGNOSIS — G8929 Other chronic pain: Secondary | ICD-10-CM

## 2022-02-21 DIAGNOSIS — I82432 Acute embolism and thrombosis of left popliteal vein: Secondary | ICD-10-CM

## 2022-02-21 DIAGNOSIS — M545 Low back pain, unspecified: Secondary | ICD-10-CM

## 2022-02-21 DIAGNOSIS — E119 Type 2 diabetes mellitus without complications: Secondary | ICD-10-CM

## 2022-02-21 DIAGNOSIS — R300 Dysuria: Secondary | ICD-10-CM | POA: Diagnosis not present

## 2022-02-21 NOTE — Assessment & Plan Note (Signed)
He continues to endorse left shoulder pain today.  Pain is worse with abduction.  He is currently attending physical therapy but is interested in additional treatment options, specifically referral to orthopedic surgery. -Orthopedic surgery referral placed today

## 2022-02-21 NOTE — Assessment & Plan Note (Signed)
Left lower extremity DVT diagnosed in late August 2023.  Provoked DVT per documentation.  Anticoagulation was discontinued in early January.  He endorses left thigh pain today and is concerned that he may have another DVT given his recent symptoms. -Left lower extremity vascular ultrasound ordered today
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
## 2022-02-21 NOTE — Assessment & Plan Note (Signed)
Patient requests a referral to urology today as he states he was told he would need to undergo annual urologic evaluation and exam in the setting of having a spinal cord injury.  He states that it has been several years since he has been seen by urology.  He is also concerned that his double back pain may be related to his kidneys or bladder as his pain seems to improve after voiding. -Urology referral placed today

## 2022-02-21 NOTE — Assessment & Plan Note (Signed)
A1c 6.7 in December.  He is not interested in starting medications at this time and would like to attempt to control his diabetes with diet alone. -Follow-up in 3 months for repeat A1c.  Will complete urine microalbumin/creatinine ratio and diabetic foot exam at that time. -Ophthalmology referral placed today for diabetic eye exam

## 2022-02-21 NOTE — Patient Instructions (Signed)
It was a pleasure to see you today.  Thank you for giving [place] the opportunity to be involved in your care.  Below is a brief recap of your visit and next steps.  We will plan to see you again in 3 months.  Summary Referrals placed to orthopedic surgery, urology, and ophthalmology. Left leg [place] ordered We will plan for follow up in 3 months.

## 2022-02-26 ENCOUNTER — Ambulatory Visit: Payer: 59

## 2022-02-26 DIAGNOSIS — M545 Low back pain, unspecified: Secondary | ICD-10-CM

## 2022-02-26 DIAGNOSIS — M6281 Muscle weakness (generalized): Secondary | ICD-10-CM | POA: Diagnosis not present

## 2022-02-26 DIAGNOSIS — R2689 Other abnormalities of gait and mobility: Secondary | ICD-10-CM | POA: Diagnosis not present

## 2022-02-26 DIAGNOSIS — R2681 Unsteadiness on feet: Secondary | ICD-10-CM | POA: Diagnosis not present
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
## 2022-02-27 ENCOUNTER — Ambulatory Visit: Payer: Self-pay | Admitting: *Deleted

## 2022-02-27 NOTE — Patient Outreach (Signed)
Care Coordination   Follow Up Visit Note   02/28/2022 Name: Jordan Jensen MRN: [id number] DOB: [birth year]  Kristopher Oppenheim Fadely is a 52 y.o. year old male who sees Doren Custard, Hazle Nordmann, MD for primary care. I spoke with  [name] by phone today.  What matters to the patients health and wellness today?  Physical therapy/quadriplegia concluded on 02/26/22- made some improvements  He discussed home health was more aggressive than the completed outpatient therapy Diabetes-  Aic 6.7 no medicines   Urology visit Linna Hoff) ? pending   Ortho referral - chronic left shoulder pain 03/04/22   He has heard from wheelchair clinic to meet with [facility] staff in March 17 2022  His present wheelchair is too small   Personal care services sent him a message with Joanna's address and number  Daughter is a traveling nurse not with his wife- only support coordinate person to person visit     Goals Addressed               This Visit's Progress     Patient Stated     assistance with getting personal care services [facility]) (pt-stated)   Not on track     Care Coordination Interventions: Collaborated with pcp and Sw as needed  regarding needs for personal care services  Discussed services with patient Reviewed [facility] SW notes Confirmed patient needs further personal care services forms mailed to him [employer] letter with personal care services, list of providers & instructions requested to be mailed to patient       Community resources [facility]) (pt-stated)   On track     Care Coordination Interventions:  Confirmed patient has completed outpatient therapy,  Continues to work with the wheelchair clinic on his interest in a motorized wheelchair  Interventions Today    Flowsheet Row Most Recent Value  Chronic Disease   Chronic disease during today's visit Diabetes  [personal care services. completion of outpatient therapy possible home health therapy, urology. ortho referral Diabetes-  Aic  6.7 no medicines]  General Interventions   General Interventions Discussed/Reviewed [employer], General Interventions Reviewed, Labs  [reviewed his thoughts on the completion of his outpatient therapies, possible future home health therapy, Discussed guidelines for ordering each]  Labs Hgb A1c every 6 months  Communication with Social Work  [spoke with [facility] about patient personal home care services Message sent]  Exercise Interventions   Exercise Discussed/Reviewed Exercise Reviewed, Physical Activity  Physical Activity Discussed/Reviewed Physical Activity Reviewed, Types of exercise  [out patient therapy, home health therapy]  Education Interventions   Education Provided Provided Education  Provided Verbal Education On --  [patient given [facility] SW number to coordinate a successful engagement]  Pharmacy Interventions   Pharmacy Dicussed/Reviewed Pharmacy Topics Discussed, Medication Adherence  Medication Adherence --  [patient reports compliant with medications]  Safety Interventions   Safety Discussed/Reviewed Safety Discussed, Home Safety  Home Safety Assistive Devices          Improved symptoms for UTI ([facility]) (pt-stated)   On track     Care Coordination Interventions: Discussed importance of adherence to all scheduled medical appointments Assessed for worsening or improving symptoms Discussed pending urology referral       Medication management -side effects  ([facility]) (pt-stated)   On track     Care Coordination Interventions: Evaluation of current treatment plan related to Jardiance & side effects and patient's adherence to plan as established by provider Discussed plans with patient for ongoing  care management follow up and provided patient with direct contact information for care management team Interventions Today    Flowsheet Row Most Recent Value  Chronic Disease   Chronic disease during today's visit Diabetes  [personal care services. completion of outpatient  therapy possible home health therapy, urology. ortho referral Diabetes-  Aic 6.7 no medicines]  General Interventions   General Interventions Discussed/Reviewed [employer], General Interventions Reviewed, Labs  [reviewed his thoughts on the completion of his outpatient therapies, possible future home health therapy, Discussed guidelines for ordering each]  Labs Hgb A1c every 6 months  Communication with Social Work  [spoke with [facility] about patient personal home care services Message sent]  Exercise Interventions   Exercise Discussed/Reviewed Exercise Reviewed, Physical Activity  Physical Activity Discussed/Reviewed Physical Activity Reviewed, Types of exercise  [out patient therapy, home health therapy]  Education Interventions   Education Provided Provided Education  Provided Verbal Education On --  [patient given [facility] SW number to coordinate a successful engagement]  Pharmacy Interventions   Pharmacy Dicussed/Reviewed Pharmacy Topics Discussed, Medication Adherence  Medication Adherence --  [patient reports compliant with medications]  Safety Interventions   Safety Discussed/Reviewed Safety Discussed, Home Safety  Home Safety Assistive Devices             SDOH assessments and interventions completed:  No     Care Coordination Interventions:  Yes, provided   Follow up plan: Follow up call scheduled for 03/12/22    Encounter Outcome:  Pt. Visit Completed   Heleena Miceli L. Lavina Hamman, RN, BSN, [place] Coordinator Office number [phone]

## 2022-02-27 NOTE — Patient Outreach (Incomplete)
  Care Coordination   Follow Up Visit Note   02/27/2022 Name: Jordan Jensen MRN: [id number] DOB: October 01, 1970  Jordan Jensen is a 52 y.o. year old male who sees Doren Custard, Hazle Nordmann, MD for primary care. I spoke with  [name] by phone today.  What matters to the patients health and wellness today?  Physical therapy/quadriplegia concluded on 02/26/22- made some improvements  He discussed home health was more aggressive than the completed outpatient therapy Diabetes-  Aic 6.7 no medicines  Urology visit Linna Hoff)  Ortho referral - chronic left shoulder pain 03/04/22  He has heard from wheelchair clinic to meet with [facility] staff in March 17 2022  His present wheelchair is too small  Personal care services sent him a message with Joanna's address and number  Daughter is a traveling nurse not with his wife- only support coordinate person to person visit     Goals Addressed   None     SDOH assessments and interventions completed:  No{THN Tip this will not be part of the note when signed-REQUIRED REPORT FIELD DO NOT DELETE (Optional):27901}     Care Coordination Interventions:  Yes, provided {THN Tip this will not be part of the note when signed-REQUIRED REPORT FIELD DO NOT DELETE (Optional):27901}  Follow up plan: Follow up call scheduled for ***   Encounter Outcome:  Pt. Visit Completed {THN Tip this will not be part of the note when signed-REQUIRED REPORT FIELD DO NOT DELETE (Optional):27901}  Jordan Jarrett L. Lavina Hamman, RN, BSN, [place] Coordinator Office number [phone]

## 2022-02-28 NOTE — Patient Instructions (Signed)
Visit Information  Thank you for taking time to visit with me today. Please don't hesitate to contact me if I can be of assistance to you.   Following are the goals we discussed today:   Goals Addressed               This Visit's Progress     Patient Stated     assistance with getting personal care services [facility]) (pt-stated)   Not on track     Care Coordination Interventions: Collaborated with pcp and Sw as needed  regarding needs for personal care services  Discussed services with patient Reviewed [facility] SW notes Confirmed patient needs further personal care services forms mailed to him [employer] letter with personal care services, list of providers & instructions requested to be mailed to patient       Community resources [facility]) (pt-stated)   On track     Care Coordination Interventions:  Confirmed patient has completed outpatient therapy,  Continues to work with the wheelchair clinic on his interest in a motorized wheelchair  Interventions Today    Flowsheet Row Most Recent Value  Chronic Disease   Chronic disease during today's visit Diabetes  [personal care services. completion of outpatient therapy possible home health therapy, urology. ortho referral Diabetes-  Aic 6.7 no medicines]  General Interventions   General Interventions Discussed/Reviewed [employer], General Interventions Reviewed, Labs  [reviewed his thoughts on the completion of his outpatient therapies, possible future home health therapy, Discussed guidelines for ordering each]  Labs Hgb A1c every 6 months  Communication with Social Work  [spoke with [facility] about patient personal home care services Message sent]  Exercise Interventions   Exercise Discussed/Reviewed Exercise Reviewed, Physical Activity  Physical Activity Discussed/Reviewed Physical Activity Reviewed, Types of exercise  [out patient therapy, home health therapy]  Education Interventions   Education Provided Provided  Education  Provided Verbal Education On --  [patient given [facility] SW number to coordinate a successful engagement]  Pharmacy Interventions   Pharmacy Dicussed/Reviewed Pharmacy Topics Discussed, Medication Adherence  Medication Adherence --  [patient reports compliant with medications]  Safety Interventions   Safety Discussed/Reviewed Safety Discussed, Home Safety  Home Safety Assistive Devices          Improved symptoms for UTI ([facility]) (pt-stated)   On track     Care Coordination Interventions: Discussed importance of adherence to all scheduled medical appointments Assessed for worsening or improving symptoms Discussed pending urology referral       Medication management -side effects  ([facility]) (pt-stated)   On track     Care Coordination Interventions: Evaluation of current treatment plan related to Jardiance & side effects and patient's adherence to plan as established by provider Discussed plans with patient for ongoing care management follow up and provided patient with direct contact information for care management team Interventions Today    Flowsheet Row Most Recent Value  Chronic Disease   Chronic disease during today's visit Diabetes  [personal care services. completion of outpatient therapy possible home health therapy, urology. ortho referral Diabetes-  Aic 6.7 no medicines]  General Interventions   General Interventions Discussed/Reviewed [employer], General Interventions Reviewed, Labs  [reviewed his thoughts on the completion of his outpatient therapies, possible future home health therapy, Discussed guidelines for ordering each]  Labs Hgb A1c every 6 months  Communication with Social Work  [spoke with [facility] about patient personal home care services Message sent]  Exercise Interventions   Exercise Discussed/Reviewed Exercise Reviewed, Physical  Activity  Physical Activity Discussed/Reviewed Physical Activity Reviewed, Types of exercise  [out patient therapy, home  health therapy]  Education Interventions   Education Provided Provided Education  Provided Verbal Education On --  [patient given [facility] SW number to coordinate a successful engagement]  Pharmacy Interventions   Pharmacy Dicussed/Reviewed Pharmacy Topics Discussed, Medication Adherence  Medication Adherence --  [patient reports compliant with medications]  Safety Interventions   Safety Discussed/Reviewed Safety Discussed, Home Safety  Home Safety Assistive Devices             Our next appointment is by telephone on 03/12/22 at 1015  Please call the care guide team at [phone] if you need to cancel or reschedule your appointment.   If you are experiencing a Mental Health or [place] or need someone to talk to, please call the Suicide and Crisis Lifeline: 988 call the [place] National Suicide Prevention Lifeline: (501)161-0576 or TTY: 703-415-3779 TTY 623-241-4017) to talk to a trained counselor call 1-800-273-TALK (toll free, 24 hour hotline) call the [facility]: 240-265-0201 call 911   Patient verbalizes understanding of instructions and care plan provided today and agrees to view in [place]. Active MyChart status and patient understanding of how to access instructions and care plan via MyChart confirmed with patient.     The patient has been provided with contact information for the care management team and has been advised to call with any health related questions or concerns.   Avagrace Botelho L. Lavina Hamman, RN, BSN, [place] Coordinator Office number [phone]

## 2022-03-03 DIAGNOSIS — J4540 Moderate persistent asthma, uncomplicated: Secondary | ICD-10-CM | POA: Diagnosis not present

## 2022-03-04 ENCOUNTER — Ambulatory Visit: Payer: 59 | Admitting: Orthopedic Surgery

## 2022-03-05 ENCOUNTER — Ambulatory Visit: Payer: Self-pay | Admitting: *Deleted

## 2022-03-05 NOTE — Patient Outreach (Signed)
  Care Coordination   03/05/2022  Name: Jordan Jensen MRN: [id number] DOB: 1970-03-25   Care Coordination Outreach Attempts:  An unsuccessful telephone outreach was attempted today to offer the patient information about available care coordination services as a benefit of their health plan. HIPAA compliant messages left on voicemail, providing contact information for CSW, encouraging patient to return CSW's call at his earliest convenience.  Follow Up Plan:  Additional outreach attempts will be made to offer the patient care coordination information and services.   Encounter Outcome:  No Answer.   Care Coordination Interventions:  No, not indicated.    Nat Christen, BSW, MSW, LCSW  Licensed Education officer, environmental Health System  Mailing [place] [address] [place], [place] [address] Physical Address-[address] [place], [place] [address] Toll Free Main # [phone] Fax # [phone] Cell # [phone] [name]

## 2022-03-06 ENCOUNTER — Encounter: Payer: Self-pay | Admitting: Radiology

## 2022-03-06 DIAGNOSIS — M6281 Muscle weakness (generalized): Secondary | ICD-10-CM | POA: Diagnosis not present

## 2022-03-06 DIAGNOSIS — J449 Chronic obstructive pulmonary disease, unspecified: Secondary | ICD-10-CM | POA: Diagnosis not present

## 2022-03-07 ENCOUNTER — Ambulatory Visit (HOSPITAL_COMMUNITY)
Admission: RE | Admit: 2022-03-07 | Discharge: 2022-03-07 | Disposition: A | Discharge status: Home / Self Care | Payer: 59 | Source: Ambulatory Visit | Attending: Family Medicine | Admitting: Family Medicine

## 2022-03-07 ENCOUNTER — Encounter (HOSPITAL_COMMUNITY): Payer: Self-pay

## 2022-03-07 VITALS — BP 100/70 | HR 64

## 2022-03-07 DIAGNOSIS — Z86718 Personal history of other venous thrombosis and embolism: Secondary | ICD-10-CM | POA: Insufficient documentation

## 2022-03-07 DIAGNOSIS — I890 Lymphedema, not elsewhere classified: Secondary | ICD-10-CM | POA: Diagnosis not present

## 2022-03-07 DIAGNOSIS — I824Y9 Acute embolism and thrombosis of unspecified deep veins of unspecified proximal lower extremity: Secondary | ICD-10-CM | POA: Diagnosis not present

## 2022-03-07 DIAGNOSIS — I5022 Chronic systolic (congestive) heart failure: Secondary | ICD-10-CM

## 2022-03-07 DIAGNOSIS — I110 Hypertensive heart disease with heart failure: Secondary | ICD-10-CM | POA: Insufficient documentation

## 2022-03-07 DIAGNOSIS — E785 Hyperlipidemia, unspecified: Secondary | ICD-10-CM | POA: Diagnosis not present

## 2022-03-07 DIAGNOSIS — G8220 Paraplegia, unspecified: Secondary | ICD-10-CM | POA: Diagnosis not present

## 2022-03-07 DIAGNOSIS — Z79899 Other long term (current) drug therapy: Secondary | ICD-10-CM | POA: Diagnosis not present

## 2022-03-07 DIAGNOSIS — Z7901 Long term (current) use of anticoagulants: Secondary | ICD-10-CM | POA: Insufficient documentation

## 2022-03-07 DIAGNOSIS — R0602 Shortness of breath: Secondary | ICD-10-CM | POA: Diagnosis present

## 2022-03-07 LAB — BASIC METABOLIC PANEL
Anion gap: 13 (ref 5–15)
BUN: 11 mg/dL (ref 6–20)
CO2: 29 mmol/L (ref 22–32)
Calcium: 9.1 mg/dL (ref 8.9–10.3)
Chloride: 95 mmol/L — ABNORMAL LOW (ref 98–111)
Creatinine, Ser: 1.01 mg/dL (ref 0.61–1.24)
GFR, Estimated: >60 mL/min (ref >60)
Glucose, Bld: 108 mg/dL — ABNORMAL HIGH (ref 70–99)
Potassium: 3.7 mmol/L (ref 3.5–5.1)
Sodium: 137 mmol/L (ref 135–145)

## 2022-03-07 LAB — BRAIN NATRIURETIC PEPTIDE: B Natriuretic Peptide: 123.2 pg/mL — ABNORMAL HIGH (ref 0.0–100.0)

## 2022-03-07 NOTE — Patient Instructions (Addendum)
Thank you for coming in today  Labs were done today, if any labs are abnormal the clinic will call you No news is good news  You have been referred to lymphedema clinic they will contact you for further details   You will be called and scheduled for coronary artery CT scan Medications: No changes  Follow up appointments:  Your physician recommends that you schedule a follow-up appointment in:  2-3 months with Dr. Elpidio Anis will receive a reminder letter in the mail a few months in advance. If you don't receive a letter, please call our office to schedule the follow-up appointment.    Do the following things EVERYDAY: Weigh yourself in the morning before breakfast. Write it down and keep it in a log. Take your medicines as prescribed Eat low salt foods--Limit salt (sodium) to 2000 mg per day.  Stay as active as you can everyday Limit all fluids for the day to less than 2 liters   At the [facility], you and your health needs are our priority. As part of our continuing mission to provide you with exceptional heart care, we have created designated Provider Care Teams. These Care Teams include your primary Cardiologist (physician) and Advanced Practice Providers (APPs- Physician Assistants and Nurse Practitioners) who all work together to provide you with the care you need, when you need it.   You may see any of the following providers on your designated Care Team at your next follow up: Dr Glori Bickers Dr Loralie Champagne Dr. Roxana Hires, NP Lyda Jester, [place] [facility] [place], [place] Forestine Na, NP Audry Riles, PharmD   Please be sure to bring in all your medications bottles to every appointment.    Thank you for choosing [facility]  If you have any questions or concerns before your next appointment please send [place] a message through [place] or call our office at [phone].    TO  LEAVE A MESSAGE FOR THE NURSE SELECT OPTION 2, PLEASE LEAVE A MESSAGE INCLUDING: YOUR NAME DATE OF BIRTH CALL BACK NUMBER REASON FOR CALL**this is important as we prioritize the call backs  YOU WILL RECEIVE A CALL BACK THE SAME DAY AS LONG AS YOU CALL BEFORE 4:00 PM
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
## 2022-03-11 ENCOUNTER — Ambulatory Visit: Payer: Self-pay | Admitting: *Deleted

## 2022-03-11 ENCOUNTER — Encounter: Payer: Self-pay | Admitting: *Deleted
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
## 2022-03-12 ENCOUNTER — Telehealth: Payer: Self-pay | Admitting: Internal Medicine

## 2022-03-12 ENCOUNTER — Ambulatory Visit: Payer: Self-pay | Admitting: *Deleted

## 2022-03-12 NOTE — Patient Outreach (Signed)
  Care Coordination   03/12/2022 Name: Jordan Jensen MRN: [id number] DOB: 1970-01-30   Care Coordination Outreach Attempts:  An unsuccessful telephone outreach was attempted today to offer the patient information about available care coordination services as a benefit of their health plan.   Follow Up Plan:  Additional outreach attempts will be made to offer the patient care coordination information and services.   Encounter Outcome:  No Answer   Care Coordination Interventions:  No, not indicated    Janicia Monterrosa L. Lavina Hamman, RN, BSN, [place] Coordinator Office number [phone]

## 2022-03-12 NOTE — Patient Outreach (Signed)
Care Coordination   Follow Up Visit Note   09/28/2022 updated entry for 09/28/22 Name: Jordan Jensen MRN: [id number] DOB: 12-Oct-1970  Jordan Jensen is a 52 y.o. year old male who sees Durwin Nora, Lucina Mellow, MD for primary care. I spoke with  [name] by phone today.  What matters to the patients health and wellness today?  Confirmed he spoke with [facility] on 03/11/22 about personal care services and have a plan to complete interventions He will keep his medicaid  Spoke with pcp   Dental appointment needed- wheelchair not able to go through [facility]  Eye doctor also was [facility] [place] - small room wide 36 manual domion on [address] [place]  Dr crisp [facility] in [place] close to door   Goals Addressed               This Visit's Progress     Patient Stated     COMPLETED: assistance with getting personal care services [facility]) (pt-stated)        Duplicate goals      Community resources Manage heart failure(RNCM)) (pt-stated)        Interventions Today    Flowsheet Row Most Recent Value  Chronic Disease   Chronic disease during today's visit Other  [personal care services, dental services, eye appointment]  General Interventions   General Interventions Discussed/Reviewed General Interventions Reviewed, Community Resources  Doctor Visits Discussed/Reviewed Doctor Visits Reviewed, Specialist            COMPLETED: Improved symptoms for UTI (THN) (pt-stated)        Duplicate goal closure      COMPLETED: Manage Congestive heart failure at home [facility]) (pt-stated)        Duplicate goal      COMPLETED: Medication management -side effects  (THN) (pt-stated)        Duplicate goal        SDOH assessments and interventions completed:  No     Care Coordination Interventions:  Yes, provided   Follow up plan: Follow up call scheduled for 10/08/22    Encounter Outcome:  Pt.  Visit Completed   Jordan Jensen L. Jordan Penner, RN, BSN, CCM [facility] Care Management Community Coordinator Office number [phone]

## 2022-03-12 NOTE — Telephone Encounter (Signed)
PCS forms  Copied Noted Sleeved  Fax # [phone]

## 2022-03-13 ENCOUNTER — Emergency Department (HOSPITAL_COMMUNITY): Payer: 59

## 2022-03-13 ENCOUNTER — Encounter (HOSPITAL_COMMUNITY): Payer: Self-pay

## 2022-03-13 ENCOUNTER — Other Ambulatory Visit: Payer: Self-pay

## 2022-03-13 ENCOUNTER — Emergency Department (HOSPITAL_COMMUNITY)
Admission: EM | Admit: 2022-03-13 | Discharge: 2022-03-14 | Disposition: A | Discharge status: Home / Self Care | Payer: 59 | Attending: Emergency Medicine | Admitting: Emergency Medicine

## 2022-03-13 DIAGNOSIS — K5900 Constipation, unspecified: Secondary | ICD-10-CM | POA: Diagnosis not present

## 2022-03-13 DIAGNOSIS — I700 Atherosclerosis of aorta: Secondary | ICD-10-CM | POA: Diagnosis not present

## 2022-03-13 DIAGNOSIS — I509 Heart failure, unspecified: Secondary | ICD-10-CM | POA: Insufficient documentation

## 2022-03-13 DIAGNOSIS — M545 Low back pain, unspecified: Secondary | ICD-10-CM | POA: Insufficient documentation

## 2022-03-13 DIAGNOSIS — G8929 Other chronic pain: Secondary | ICD-10-CM | POA: Insufficient documentation

## 2022-03-13 DIAGNOSIS — K4020 Bilateral inguinal hernia, without obstruction or gangrene, not specified as recurrent: Secondary | ICD-10-CM | POA: Diagnosis not present

## 2022-03-13 DIAGNOSIS — I110 Hypertensive heart disease with heart failure: Secondary | ICD-10-CM | POA: Diagnosis not present

## 2022-03-13 DIAGNOSIS — K429 Umbilical hernia without obstruction or gangrene: Secondary | ICD-10-CM | POA: Insufficient documentation

## 2022-03-13 DIAGNOSIS — J45909 Unspecified asthma, uncomplicated: Secondary | ICD-10-CM | POA: Insufficient documentation

## 2022-03-13 DIAGNOSIS — N400 Enlarged prostate without lower urinary tract symptoms: Secondary | ICD-10-CM | POA: Diagnosis not present

## 2022-03-13 DIAGNOSIS — Z87891 Personal history of nicotine dependence: Secondary | ICD-10-CM | POA: Diagnosis not present

## 2022-03-13 LAB — COMPREHENSIVE METABOLIC PANEL
ALT: 19 U/L (ref 0–44)
AST: 20 U/L (ref 15–41)
Albumin: 3.7 g/dL (ref 3.5–5.0)
Alkaline Phosphatase: 59 U/L (ref 38–126)
Anion gap: 8 (ref 5–15)
BUN: 11 mg/dL (ref 6–20)
CO2: 33 mmol/L — ABNORMAL HIGH (ref 22–32)
Calcium: 8.9 mg/dL (ref 8.9–10.3)
Chloride: 96 mmol/L — ABNORMAL LOW (ref 98–111)
Creatinine, Ser: 0.96 mg/dL (ref 0.61–1.24)
GFR, Estimated: >60 mL/min (ref >60)
Glucose, Bld: 101 mg/dL — ABNORMAL HIGH (ref 70–99)
Potassium: 3.9 mmol/L (ref 3.5–5.1)
Sodium: 137 mmol/L (ref 135–145)
Total Bilirubin: 0.8 mg/dL (ref 0.3–1.2)
Total Protein: 8.3 g/dL — ABNORMAL HIGH (ref 6.5–8.1)

## 2022-03-13 LAB — CBC
HCT: 48.9 % (ref 39.0–52.0)
Hemoglobin: 15.2 g/dL (ref 13.0–17.0)
MCH: 28.7 pg (ref 26.0–34.0)
MCHC: 31.1 g/dL (ref 30.0–36.0)
MCV: 92.3 fL (ref 80.0–100.0)
Platelets: 214 10*3/uL (ref 150–400)
RBC: 5.3 MIL/uL (ref 4.22–5.81)
RDW: 13.2 % (ref 11.5–15.5)
WBC: 15.6 10*3/uL — ABNORMAL HIGH (ref 4.0–10.5)
nRBC: 0 % (ref 0.0–0.2)

## 2022-03-13 NOTE — ED Triage Notes (Signed)
Pt arrived from home via [facility] c/o back pain and difficulty breathing x 2 days.

## 2022-03-14 DIAGNOSIS — M545 Low back pain, unspecified: Secondary | ICD-10-CM | POA: Diagnosis not present

## 2022-03-14 LAB — URINALYSIS, ROUTINE W REFLEX MICROSCOPIC
Bilirubin Urine: NEGATIVE
Glucose, UA: NEGATIVE mg/dL
Hgb urine dipstick: NEGATIVE
Ketones, ur: NEGATIVE mg/dL
Leukocytes,Ua: NEGATIVE
Nitrite: NEGATIVE
Protein, ur: NEGATIVE mg/dL
Specific Gravity, Urine: 1.008 (ref 1.005–1.030)
pH: 5 (ref 5.0–8.0)

## 2022-03-14 MED ORDER — OXYCODONE HCL 5 MG PO TABS
5.0000 mg | ORAL_TABLET | Freq: Once | ORAL | Status: AC
  Administered 2022-03-14: 5 mg via ORAL
  Filled 2022-03-14: qty 1

## 2022-03-14 MED ORDER — NAPROXEN 500 MG PO TABS: 500.0000 mg | ORAL_TABLET | Freq: Two times a day (BID) | ORAL | 0 refills | Status: DC

## 2022-03-14 MED ORDER — LIDOCAINE 5 % EX PTCH: 1.0000 | MEDICATED_PATCH | CUTANEOUS | 0 refills | Status: DC

## 2022-03-14 MED ORDER — METHOCARBAMOL 500 MG PO TABS: 500.0000 mg | ORAL_TABLET | Freq: Three times a day (TID) | ORAL | 0 refills | Status: DC | PRN
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
## 2022-03-14 NOTE — Discharge Instructions (Signed)
You were evaluated in the Emergency Department and after careful evaluation, we did not find any emergent condition requiring admission or further testing in the hospital.  Your exam/testing today is overall reassuring.  Back pain likely due to arthritis and narrowing in your lower lumbar spine.  Recommend follow-up with the neurosurgeons for continued management.  Use the Naprosyn twice daily for pain.  Can use the Robaxin muscle relaxer as needed for more significant pain but use caution as this medication can cause drowsiness.  Also recommend the numbing medications provided as needed.  Please return to the Emergency Department if you experience any worsening of your condition.   Thank you for allowing [place] to be a part of your care.

## 2022-03-17 ENCOUNTER — Ambulatory Visit (HOSPITAL_COMMUNITY): Payer: Medicaid Other | Admitting: Occupational Therapy

## 2022-03-18 ENCOUNTER — Ambulatory Visit (INDEPENDENT_AMBULATORY_CARE_PROVIDER_SITE_OTHER): Payer: 59

## 2022-03-18 ENCOUNTER — Encounter: Payer: Self-pay | Admitting: Orthopedic Surgery

## 2022-03-18 ENCOUNTER — Ambulatory Visit (INDEPENDENT_AMBULATORY_CARE_PROVIDER_SITE_OTHER): Payer: 59 | Admitting: Orthopedic Surgery

## 2022-03-18 VITALS — BP 82/56 | HR 54 | Ht 71.0 in | Wt >= 6400 oz

## 2022-03-18 DIAGNOSIS — G8929 Other chronic pain: Secondary | ICD-10-CM | POA: Diagnosis not present

## 2022-03-18 DIAGNOSIS — M25512 Pain in left shoulder: Secondary | ICD-10-CM | POA: Diagnosis not present

## 2022-03-18 MED ORDER — METHYLPREDNISOLONE ACETATE 40 MG/ML IJ SUSP
40.0000 mg | Freq: Once | INTRAMUSCULAR | Status: AC
  Administered 2022-03-18: 40 mg via INTRA_ARTICULAR

## 2022-03-18 NOTE — Patient Instructions (Signed)
Instructions Following Joint Injections  In clinic today, you received an injection in one of your joints (sometimes more than one).  Occasionally, you can have some pain at the injection site, this is normal.  You can place ice at the injection site, or take over-the-counter medications such as Tylenol (acetaminophen) or Advil (ibuprofen).  Please follow all directions listed on the bottle.  If your joint (knee or shoulder) becomes swollen, red or very painful, please contact the clinic for additional assistance.   Two medications were injected, including lidocaine and a steroid (often referred to as cortisone).  Lidocaine is effective almost immediately but wears off quickly.  However, the steroid can take a few days to improve your symptoms.  In some cases, it can make your pain worse for a couple of days.  Do not be concerned if this happens as it is common.  You can apply ice or take some over-the-counter medications as needed.      Rotator Cuff Tear/Tendinitis Rehab   Ask your health care provider which exercises are safe for you. Do exercises exactly as told by your health care provider and adjust them as directed. It is normal to feel mild stretching, pulling, tightness, or discomfort as you do these exercises. Stop right away if you feel sudden pain or your pain gets worse. Do not begin these exercises until told by your health care provider. Stretching and range-of-motion exercises  These exercises warm up your muscles and joints and improve the movement and flexibility of your shoulder. These exercises also help to relieve pain.  Shoulder pendulum In this exercise, you let the injured arm dangle toward the floor and then swing it like a clock pendulum. Stand near a table or counter that you can hold onto for balance. Bend forward at the waist and let your left / right arm hang straight down. Use your other arm to support you and help you stay balanced. Relax your left / right arm and  shoulder muscles, and move your hips and your trunk so your left / right arm swings freely. Your arm should swing because of the motion of your body, not because you are using your arm or shoulder muscles. Keep moving your hips and trunk so your arm swings in the following directions, as told by your health care provider: Side to side. Forward and backward. In clockwise and counterclockwise circles. Slowly return to the starting position. Repeat 10 times, or for 10 seconds per direction. Complete this exercise 2-3 times a day.      Shoulder flexion, seated This exercise is sometimes called table slides. In this exercise, you raise your arm in front of your body until you feel a stretch in your injured shoulder. Sit in a stable chair so your left / right forearm can rest on a flat surface. Your elbow should rest at a height that keeps your upper arm next to your body. Keeping your left / right shoulder relaxed, lean forward at the waist and let your hand slide forward (flexion). Stop when you feel a stretch in your shoulder, or when you reach the angle that is recommended by your health care provider. Hold for 5 seconds. Slowly return to the starting position. Repeat 10 times. Complete this exercise 1-2  times a day.       Shoulder flexion, standing In this exercise, you raise your arm in front of your body (flexion) until you feel a stretch in your injured shoulder. Stand and hold a broomstick,   a cane, or a similar object. Place your hands a little more than shoulder-width apart on the object. Your left / right hand should be palm-up, and your other hand should be palm-down. Keep your elbow straight and your shoulder muscles relaxed. Push the stick up with your healthy arm to raise your left / right arm in front of your body, and then over your head until you feel a stretch in your shoulder. Avoid shrugging your shoulder while you raise your arm. Keep your shoulder blade tucked down toward the  middle of your back. Keep your left / right shoulder muscles relaxed. Hold for 10 seconds. Slowly return to the starting position. Repeat 10 times. Complete this exercise 1-2 times a day.      Shoulder abduction, active-assisted You will need a stick, broom handle, or similar object to help you (assist) in doing this exercise. Lie on your back. This is the supine position. Hold a broomstick, a cane, or a similar object. Place your hands a little more than shoulder-width apart on the object. Your left / right hand should be palm-up, and your other hand should be palm-down. Keeping your shoulder relaxed, push the stick to raise your left / right arm out to your side (abduction) and then over your head. Use your other hand to help move the stick. Stop when you feel a stretch in your shoulder, or when you reach the angle that is recommended by your health care provider. Avoid shrugging your shoulder while you raise your arm. Keep your shoulder blade tucked down toward the middle of your back. Hold for 10 seconds. Slowly return to the starting position. Repeat 10 times. Complete this exercise 1-2 times a day.      Shoulder flexion, active-assisted Lie on your back. You may bend your knees for comfort. Hold a broomstick, a cane, or a similar object so that your hands are about shoulder-width apart. Your palms should face toward your feet. Raise your left / right arm over your head, then behind your head toward the floor (flexion). Use your other hand to help you do this (active-assisted). Stop when you feel a gentle stretch in your shoulder, or when you reach the angle that is recommended by your health care provider. Hold for 10 seconds. Use the stick and your other arm to help you return your left / right arm to the starting position. Repeat 10 times. Complete this exercise 1-2 times a day.      External rotation Sit in a stable chair without armrests, or stand up. Tuck a soft object, such  as a folded towel or a small ball, under your left / right upper arm. Hold a broomstick, a cane, or a similar object with your palms face-down, toward the floor. Bend your elbows to a 90-degree angle (right angle), and keep your hands about shoulder-width apart. Straighten your healthy arm and push the stick across your body, toward your left / right side. Keep your left / right arm bent. This will rotate your left / right forearm away from your body (external rotation). Hold for 10 seconds. Slowly return to the starting position. Repeat 10 times. Complete this exercise 1-2 times a day.        Strengthening exercises These exercises build strength and endurance in your shoulder. Endurance is the ability to use your muscles for a long time, even after they get tired. Do not start doing these exercises until your health care provider approves. Shoulder flexion, isometric Stand   or sit in a doorway, facing the door frame. Keep your left / right arm straight and make a gentle fist with your hand. Place your fist against the door frame. Only your fist should be touching the frame. Keep your upper arm at your side. Gently press your fist against the door frame, as if you are trying to raise your arm above your head (isometric shoulder flexion). Avoid shrugging your shoulder while you press your hand into the door frame. Keep your shoulder blade tucked down toward the middle of your back. Hold for 10 seconds. Slowly release the tension, and relax your muscles completely before you repeat the exercise. Repeat 10 times. Complete this exercise 3 times per week.      Shoulder abduction, isometric Stand or sit in a doorway. Your left / right arm should be closest to the door frame. Keep your left / right arm straight, and place the back of your hand against the door frame. Only your hand should be touching the frame. Keep the rest of your arm close to your side. Gently press the back of your hand against  the door frame, as if you are trying to raise your arm out to the side (isometric shoulder abduction). Avoid shrugging your shoulder while you press your hand into the door frame. Keep your shoulder blade tucked down toward the middle of your back. Hold for 10 seconds. Slowly release the tension, and relax your muscles completely before you repeat the exercise. Repeat 10 times. Complete this exercise 3 times per week.      Internal rotation, isometric This is an exercise in which you press your palm against a door frame without moving your shoulder joint (isometric). Stand or sit in a doorway, facing the door frame. Bend your left / right elbow, and place the palm of your hand against the door frame. Only your palm should be touching the frame. Keep your upper arm at your side. Gently press your hand against the door frame, as if you are trying to push your arm toward your abdomen (internal rotation). Gradually increase the pressure until you are pressing as hard as you can. Stop increasing the pressure if you feel shoulder pain. Avoid shrugging your shoulder while you press your hand into the door frame. Keep your shoulder blade tucked down toward the middle of your back. Hold for 10 seconds. Slowly release the tension, and relax your muscles completely before you repeat the exercise. Repeat 10 times. Complete this exercise 3 times per week.      External rotation, isometric This is an exercise in which you press the back of your wrist against a door frame without moving your shoulder joint (isometric). Stand or sit in a doorway, facing the door frame. Bend your left / right elbow and place the back of your wrist against the door frame. Only the back of your wrist should be touching the frame. Keep your upper arm at your side. Gently press your wrist against the door frame, as if you are trying to push your arm away from your abdomen (external rotation). Gradually increase the pressure until  you are pressing as hard as you can. Stop increasing the pressure if you feel pain. Avoid shrugging your shoulder while you press your wrist into the door frame. Keep your shoulder blade tucked down toward the middle of your back. Hold for 10 seconds. Slowly release the tension, and relax your muscles completely before you repeat the exercise. Repeat 10 times. Complete   this exercise 3 times per week.       Scapular retraction Sit in a stable chair without armrests, or stand up. Secure an exercise band to a stable object in front of you so the band is at shoulder height. Hold one end of the exercise band in each hand. Your palms should face down. Squeeze your shoulder blades together (retraction) and move your elbows slightly behind you. Do not shrug your shoulders upward while you do this. Hold for 10 seconds. Slowly return to the starting position. Repeat 10 times. Complete this exercise 3 times per week.      Shoulder extension Sit in a stable chair without armrests, or stand up. Secure an exercise band to a stable object in front of you so the band is above shoulder height. Hold one end of the exercise band in each hand. Straighten your elbows and lift your hands up to shoulder height. Squeeze your shoulder blades together and pull your hands down to the sides of your thighs (extension). Stop when your hands are straight down by your sides. Do not let your hands go behind your body. Hold for 10 seconds. Slowly return to the starting position. Repeat 10 times. Complete this exercise 3 times per week.       Scapular protraction, supine Lie on your back on a firm surface (supine position). Hold a 5 lbs (or soup can) weight in your left / right hand. Raise your left / right arm straight into the air so your hand is directly above your shoulder joint. Push the weight into the air so your shoulder (scapula) lifts off the surface that you are lying on. The scapula will push up or forward  (protraction). Do not move your head, neck, or back. Hold for 10 seconds. Slowly return to the starting position. Let your muscles relax completely before you repeat this exercise.  Repeat 10 times. Complete this exercise 3 times per week.          

## 2022-03-19 ENCOUNTER — Telehealth (HOSPITAL_COMMUNITY): Payer: Self-pay

## 2022-03-19 ENCOUNTER — Ambulatory Visit (INDEPENDENT_AMBULATORY_CARE_PROVIDER_SITE_OTHER): Payer: 59 | Admitting: Urology

## 2022-03-19 ENCOUNTER — Other Ambulatory Visit: Payer: Self-pay

## 2022-03-19 ENCOUNTER — Telehealth: Payer: Self-pay | Admitting: Internal Medicine

## 2022-03-19 ENCOUNTER — Encounter: Payer: Self-pay | Admitting: Urology

## 2022-03-19 VITALS — BP 125/76 | HR 75

## 2022-03-19 DIAGNOSIS — R300 Dysuria: Secondary | ICD-10-CM | POA: Diagnosis not present

## 2022-03-19 DIAGNOSIS — N319 Neuromuscular dysfunction of bladder, unspecified: Secondary | ICD-10-CM | POA: Diagnosis not present

## 2022-03-19 DIAGNOSIS — R350 Frequency of micturition: Secondary | ICD-10-CM | POA: Diagnosis not present

## 2022-03-19 DIAGNOSIS — I890 Lymphedema, not elsewhere classified: Secondary | ICD-10-CM

## 2022-03-19 DIAGNOSIS — I5022 Chronic systolic (congestive) heart failure: Secondary | ICD-10-CM

## 2022-03-19 LAB — BLADDER SCAN AMB NON-IMAGING: Scan Result: 92

## 2022-03-19 MED ORDER — FLUTICASONE PROPIONATE 50 MCG/ACT NA SUSP: 2.0000 | Freq: Every day | NASAL | 3 refills | Status: DC

## 2022-03-19 MED ORDER — ALFUZOSIN HCL ER 10 MG PO TB24: 10.0000 mg | ORAL_TABLET | Freq: Every day | ORAL | 11 refills | Status: DC

## 2022-03-19 MED ORDER — BUDESONIDE-FORMOTEROL FUMARATE 160-4.5 MCG/ACT IN AERO: 2.0000 | INHALATION_SPRAY | RESPIRATORY_TRACT | 3 refills | Status: DC | PRN
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
## 2022-03-19 NOTE — Patient Instructions (Signed)

## 2022-03-19 NOTE — Progress Notes (Signed)
post void residual=92 

## 2022-03-19 NOTE — Telephone Encounter (Signed)
Pt needs refills on   budesonide-formoterol (SYMBICORT) 160-4.5 MCG/ACT inhaler   fluticasone (FLONASE) 50 MCG/ACT nasal spray    [place], [place] - [id number] [place], [place] [address] Phone: [phone]  Fax: [phone] DEA #: -- DAW Reason: --

## 2022-03-19 NOTE — Telephone Encounter (Signed)
Patient's wife called about CTA that was going to be scheduled. I did not see an order, but wanted to double check with you about it because I see it in your last note. Thanks!

## 2022-03-19 NOTE — Telephone Encounter (Signed)
Refills sent to pharmacy. 

## 2022-03-20 ENCOUNTER — Other Ambulatory Visit: Payer: Self-pay

## 2022-03-20 LAB — MICROSCOPIC EXAMINATION: Bacteria, UA: NONE SEEN

## 2022-03-20 LAB — URINALYSIS, ROUTINE W REFLEX MICROSCOPIC
Bilirubin, UA: NEGATIVE
Glucose, UA: NEGATIVE
Ketones, UA: NEGATIVE
Leukocytes,UA: NEGATIVE
Nitrite, UA: NEGATIVE
RBC, UA: NEGATIVE
Specific Gravity, UA: 1.02 (ref 1.005–1.030)
Urobilinogen, Ur: 2 mg/dL — ABNORMAL HIGH (ref 0.2–1.0)
pH, UA: 8.5 — ABNORMAL HIGH (ref 5.0–7.5)

## 2022-03-20 MED ORDER — BUDESONIDE-FORMOTEROL FUMARATE 160-4.5 MCG/ACT IN AERO: 2.0000 | INHALATION_SPRAY | Freq: Two times a day (BID) | RESPIRATORY_TRACT | 3 refills | Status: DC

## 2022-03-21 ENCOUNTER — Telehealth: Payer: Self-pay | Admitting: *Deleted

## 2022-03-21 NOTE — Telephone Encounter (Signed)
     Patient  visit on 03/14/2022  at [facility] was for pain   Have you been able to follow up with your primary care physician?Will follow up with Dr but has appt and [place] back and spine and xrays will have transportation and medicines he needs .   The patient was  able to obtain any needed medicine or equipment.  Are there diet recommendations that you are having difficulty following?  Patient expresses understanding of discharge instructions and education provided has no other needs at this time.    [place] [phone] [address] , [place] [address] Email : [email or web address]

## 2022-03-24 ENCOUNTER — Telehealth (HOSPITAL_COMMUNITY): Payer: Self-pay | Admitting: Emergency Medicine

## 2022-03-24 NOTE — Telephone Encounter (Signed)
Forms ready and faxed, contact patient for a copy

## 2022-03-24 NOTE — Telephone Encounter (Signed)
Attempted to call patient regarding upcoming cardiac CT appointment. °Left message on voicemail with name and callback number °Jaquae Rieves RN Navigator Cardiac Imaging ° Heart and Vascular Services °[phone] Office °[phone] Cell ° °

## 2022-03-25 ENCOUNTER — Ambulatory Visit: Payer: 59 | Admitting: Internal Medicine

## 2022-03-25 ENCOUNTER — Ambulatory Visit (HOSPITAL_COMMUNITY)
Admission: RE | Admit: 2022-03-25 | Discharge: 2022-03-25 | Disposition: A | Discharge status: Home / Self Care | Payer: 59 | Source: Ambulatory Visit | Attending: Cardiology | Admitting: Cardiology

## 2022-03-25 DIAGNOSIS — I5022 Chronic systolic (congestive) heart failure: Secondary | ICD-10-CM | POA: Diagnosis not present

## 2022-03-25 MED ORDER — NITROGLYCERIN 0.4 MG SL SUBL: 0.8000 mg | SUBLINGUAL_TABLET | Freq: Once | SUBLINGUAL | Status: AC

## 2022-03-25 MED ORDER — METOPROLOL TARTRATE 5 MG/5ML IV SOLN
INTRAVENOUS | Status: AC
  Administered 2022-03-25: 10 mg via INTRAVENOUS
  Filled 2022-03-25: qty 10

## 2022-03-25 MED ORDER — IOHEXOL 350 MG/ML SOLN
115.0000 mL | Freq: Once | INTRAVENOUS | Status: AC | PRN
  Administered 2022-03-25: 115 mL via INTRAVENOUS

## 2022-03-25 MED ORDER — METOPROLOL TARTRATE 5 MG/5ML IV SOLN: 10.0000 mg | INTRAVENOUS | Status: DC | PRN

## 2022-03-25 MED ORDER — NITROGLYCERIN 0.4 MG SL SUBL
SUBLINGUAL_TABLET | SUBLINGUAL | Status: AC
  Filled 2022-03-25: qty 2

## 2022-03-25 MED ORDER — NITROGLYCERIN 0.4 MG SL SUBL
SUBLINGUAL_TABLET | SUBLINGUAL | Status: AC
  Administered 2022-03-25: 0.8 mg via SUBLINGUAL
  Filled 2022-03-25: qty 2

## 2022-03-25 NOTE — Telephone Encounter (Signed)
Order placed and pre cert form complete

## 2022-03-27 ENCOUNTER — Ambulatory Visit: Payer: Self-pay | Admitting: *Deleted

## 2022-03-27 NOTE — Patient Outreach (Signed)
  Care Coordination   03/27/2022  Name: Jordan Jensen MRN: [id number] DOB: [birth year]   Care Coordination Outreach Attempts:  An unsuccessful telephone outreach was attempted today to offer the patient information about available care coordination services as a benefit of their health plan. Automated recording indicating call cannot be completed as dialed, after several unsuccessful attempts.  HIPAA compliant messages left on voicemail for wife, [name] and daughter, [name] providing contact information for CSW, encouraging them to return CSW's call at their earliest convenience.   Follow Up Plan:  Additional outreach attempts will be made to offer the patient care coordination information and services.    Encounter Outcome:  No Answer.    Care Coordination Interventions:  No, not indicated.     Nat Christen, BSW, MSW, LCSW  Licensed Education officer, [facility]  Mailing [place] [address], [place], [place] [address] Physical Address-[address], [place], [place] [address] Toll Free Main # [phone] Fax # [phone] Cell # [phone] [name]

## 2022-03-31 ENCOUNTER — Ambulatory Visit (HOSPITAL_COMMUNITY): Payer: 59 | Admitting: Occupational Therapy

## 2022-04-01 ENCOUNTER — Ambulatory Visit: Payer: Self-pay | Admitting: *Deleted

## 2022-04-01 ENCOUNTER — Encounter: Payer: Self-pay | Admitting: *Deleted

## 2022-04-01 DIAGNOSIS — Z993 Dependence on wheelchair: Secondary | ICD-10-CM

## 2022-04-01 DIAGNOSIS — S14105S Unspecified injury at C5 level of cervical spinal cord, sequela: Secondary | ICD-10-CM
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
## 2022-04-02 ENCOUNTER — Telehealth: Payer: Self-pay | Admitting: *Deleted

## 2022-04-02 NOTE — Telephone Encounter (Signed)
   Telephone encounter was:  Unsuccessful.  04/02/2022 Name: COLEBY BORGHESE MRN: [id number] DOB: [birth year]  Unsuccessful outbound call made today to assist with:  Transportation Needs   Outreach Attempt:  1st Attempt  A HIPAA compliant voice message was left requesting a return call.  Instructed patient to call back at [phone].  [place] [phone] [address] , [place] [address] Email : [email or web address]

## 2022-04-07 ENCOUNTER — Other Ambulatory Visit: Payer: Self-pay

## 2022-04-07 ENCOUNTER — Telehealth: Payer: Self-pay | Admitting: Internal Medicine

## 2022-04-07 ENCOUNTER — Telehealth: Payer: Self-pay | Admitting: *Deleted

## 2022-04-07 DIAGNOSIS — J4530 Mild persistent asthma, uncomplicated: Secondary | ICD-10-CM

## 2022-04-07 MED ORDER — BUDESONIDE-FORMOTEROL FUMARATE 160-4.5 MCG/ACT IN AERO: 2.0000 | INHALATION_SPRAY | Freq: Two times a day (BID) | RESPIRATORY_TRACT | 3 refills | Status: DC

## 2022-04-07 NOTE — Telephone Encounter (Signed)
   Telephone encounter was:  Successful.  04/07/2022 Name: Jordan Jensen MRN: [id number] DOB: 1970-01-13  Jordan Jensen is a 52 y.o. year old male who is a primary care patient of Johnette Abraham, MD . The community resource team was consulted for assistance with Transportation Needs  Referred to [place] and [place] guide performed the following interventions: Patient provided with information about care guide support team and interviewed to confirm resource needs.  Follow Up Plan:  No further follow up planned at this time. The patient has been provided with needed resources.

## 2022-04-07 NOTE — Telephone Encounter (Signed)
Refill sent.

## 2022-04-07 NOTE — Telephone Encounter (Signed)
Prescription Request  04/07/2022  LOV: 02/21/2022  What is the name of the medication or equipment? budesonide-formoterol (SYMBICORT) 160-4.5 MCG/ACT inhaler   Have you contacted your pharmacy to request a refill? No   Which pharmacy would you like this sent to?  [facility] [place] [facility] [place] [place] [id number] [place] [address] [place] [address] Phone: [phone] Fax: [phone]    Patient notified that their request is being sent to the clinical staff for review and that they should receive a response within 2 business days.   Please advise at [facility] [phone]

## 2022-04-10 ENCOUNTER — Encounter: Payer: Self-pay | Admitting: *Deleted

## 2022-04-10 ENCOUNTER — Ambulatory Visit: Payer: 59 | Admitting: Internal Medicine

## 2022-04-14 ENCOUNTER — Ambulatory Visit: Payer: 59 | Admitting: Internal Medicine

## 2022-04-14 ENCOUNTER — Encounter: Payer: Self-pay | Admitting: *Deleted

## 2022-04-14 ENCOUNTER — Ambulatory Visit: Payer: Self-pay | Admitting: *Deleted
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
## 2022-04-18 ENCOUNTER — Ambulatory Visit (INDEPENDENT_AMBULATORY_CARE_PROVIDER_SITE_OTHER): Payer: 59 | Admitting: Internal Medicine

## 2022-04-18 ENCOUNTER — Encounter: Payer: Self-pay | Admitting: Internal Medicine

## 2022-04-18 VITALS — BP 81/57 | HR 63 | Ht 71.0 in | Wt >= 6400 oz

## 2022-04-18 DIAGNOSIS — E119 Type 2 diabetes mellitus without complications: Secondary | ICD-10-CM | POA: Diagnosis not present

## 2022-04-18 DIAGNOSIS — Z6841 Body Mass Index (BMI) 40.0 and over, adult: Secondary | ICD-10-CM

## 2022-04-18 MED ORDER — OZEMPIC (0.25 OR 0.5 MG/DOSE) 2 MG/3ML ~~LOC~~ SOPN
0.2500 mg | PEN_INJECTOR | SUBCUTANEOUS | 0 refills | Status: DC
Start: 2022-04-18 — End: 2022-05-30

## 2022-04-18 NOTE — Patient Instructions (Signed)
It was a pleasure to see you today.  Thank you for giving [place] the opportunity to be involved in your care.  Below is a brief recap of your visit and next steps.  We will plan to see you again in May.  Summary Start Ozempic 0.25 mg weekly Follow up in May

## 2022-04-18 NOTE — Progress Notes (Signed)
Acute Office Visit  Subjective:     Patient ID: Jordan Jensen, male    DOB: 21-Jul-1970, 52 y.o.   MRN: [id number]  Chief Complaint  Patient presents with   Weight Loss   Jordan Jensen presents for an acute visit today to discuss morbid obesity.  He has recently been seen by neurosurgery (Dr. Franky Macho) for evaluation of moderate-severe spinal stenosis at L3-4 and moderate spinal stenosis at L4-5.  It has been recommended that Jordan Jensen lose 100 pounds in order to improve his surgical candidacy.  His BMI is currently 57.8.  He has attempted to make dietary changes to help with weight loss, but is interested in starting a medication for weight loss today.  He has no additional concerns to discuss.  Review of Systems  Constitutional:  Negative for chills and fever.  HENT:  Negative for sore throat.   Respiratory:  Negative for cough and shortness of breath.   Cardiovascular:  Negative for chest pain, palpitations and leg swelling.  Gastrointestinal:  Negative for abdominal pain, blood in stool, constipation, diarrhea, nausea and vomiting.  Genitourinary:  Negative for dysuria and hematuria.  Musculoskeletal:  Positive for back pain. Negative for myalgias.  Skin:  Negative for itching and rash.  Neurological:  Negative for dizziness and headaches.  Psychiatric/Behavioral:  Negative for depression and suicidal ideas.       Objective:    BP (!) 81/57   Pulse 63   Ht 5\' 11"  (1.803 m)   Wt (!) 415 lb (188.2 kg)   SpO2 90%   BMI 57.88 kg/m   Physical Exam Vitals reviewed.  Constitutional:      General: He is not in acute distress.    Appearance: Normal appearance. He is obese. He is not ill-appearing.     Comments: Examined in wheelchair  HENT:     Head: Normocephalic and atraumatic.     Right Ear: External ear normal.     Left Ear: External ear normal.     Nose: Nose normal. No congestion or rhinorrhea.     Mouth/Throat:     Mouth: Mucous membranes are moist.     Pharynx:  Oropharynx is clear.  Eyes:     General: No scleral icterus.    Extraocular Movements: Extraocular movements intact.     Conjunctiva/sclera: Conjunctivae normal.     Pupils: Pupils are equal, round, and reactive to light.  Cardiovascular:     Rate and Rhythm: Normal rate and regular rhythm.     Pulses: Normal pulses.     Heart sounds: Normal heart sounds. No murmur heard. Pulmonary:     Effort: Pulmonary effort is normal.     Breath sounds: Normal breath sounds. No wheezing, rhonchi or rales.  Abdominal:     General: Abdomen is flat. Bowel sounds are normal. There is no distension.     Palpations: Abdomen is soft.     Tenderness: There is no abdominal tenderness.  Musculoskeletal:        General: No swelling or deformity. Normal range of motion.     Cervical back: Normal range of motion.  Skin:    General: Skin is warm and dry.     Capillary Refill: Capillary refill takes less than 2 seconds.  Neurological:     General: No focal deficit present.     Mental Status: He is alert and oriented to person, place, and time.     Motor: No weakness.  Psychiatric:  Mood and Affect: Mood normal.        Behavior: Behavior normal.        Thought Content: Thought content normal.       Assessment & Plan:   Problem List Items Addressed This Visit       Type 2 diabetes mellitus without complications - Primary    New diagnosis.  Meeting criteria with A1c 6.7 in December 2023.  He has previously declined to start medication for treatment of diabetes as he wanted to focus on dietary changes. -Start Ozempic 0.25 mg weekly -Repeat A1c at follow-up in late May      Body mass index (BMI) 50.0-59.9, adult    Presenting today for an acute visit to discuss weight loss.  He currently weighs 415 pounds.  It has been recommended that he lose close to 100 pounds to improve his surgical candidacy.  He is currently wheelchair-bound and has attempted to make dietary changes aimed at weight loss  without sustained success.  He would like to start a medication today. -Start Ozempic 0.25 mg weekly -I have also offered to refer him to medical nutrition therapy, but he is not interested at this time as he feels he knows what changes need to be made from a dietary standpoint. -He will return to care for previously scheduled follow-up on 5/24.       Meds ordered this encounter  Medications   Semaglutide,0.25 or 0.5MG /DOS, (OZEMPIC, 0.25 OR 0.5 MG/DOSE,) 2 MG/3ML SOPN    Sig: Inject 0.25 mg into the skin once a week.    Dispense:  3 mL    Refill:  0    Return if symptoms worsen or fail to improve.  Billie Lade, MD

## 2022-04-24 NOTE — Assessment & Plan Note (Signed)
Presenting today for an acute visit to discuss weight loss.  He currently weighs 415 pounds.  It has been recommended that he lose close to 100 pounds to improve his surgical candidacy.  He is currently wheelchair-bound and has attempted to make dietary changes aimed at weight loss without sustained success.  He would like to start a medication today. -Start Ozempic 0.25 mg weekly -I have also offered to refer him to medical nutrition therapy, but he is not interested at this time as he feels he knows what changes need to be made from a dietary standpoint. -He will return to care for previously scheduled follow-up on 5/24.

## 2022-04-24 NOTE — Assessment & Plan Note (Signed)
New diagnosis.  Meeting criteria with A1c 6.7 in December 2023.  He has previously declined to start medication for treatment of diabetes as he wanted to focus on dietary changes. -Start Ozempic 0.25 mg weekly -Repeat A1c at follow-up in late May

## 2022-04-28 ENCOUNTER — Ambulatory Visit: Payer: Self-pay | Admitting: *Deleted

## 2022-04-28 ENCOUNTER — Telehealth: Payer: Self-pay | Admitting: Internal Medicine

## 2022-04-28 ENCOUNTER — Encounter: Payer: Self-pay | Admitting: *Deleted

## 2022-04-28 NOTE — Telephone Encounter (Signed)
Pt called in regards to speaking with Child psychotherapist. If you could follow up with pt.

## 2022-04-28 NOTE — Telephone Encounter (Signed)
Patient wanting to know the status of counselor that social working was getting for him. Reached out to [place].
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
## 2022-04-28 NOTE — Telephone Encounter (Signed)
LVM

## 2022-04-28 NOTE — Telephone Encounter (Signed)
Jordan Jensen spoke with patient.
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
## 2022-04-29 ENCOUNTER — Telehealth (HOSPITAL_COMMUNITY): Payer: Self-pay

## 2022-04-29 ENCOUNTER — Encounter (HOSPITAL_COMMUNITY): Payer: Self-pay

## 2022-04-29 NOTE — Telephone Encounter (Signed)
Patient called about CT he had done.  He does not understand results and wants to know what they mean and if there is any treatment going forward.  Please advise

## 2022-04-30 ENCOUNTER — Encounter (HOSPITAL_COMMUNITY): Payer: Self-pay | Admitting: Occupational Therapy

## 2022-04-30 ENCOUNTER — Ambulatory Visit (HOSPITAL_COMMUNITY): Payer: 59 | Admitting: Occupational Therapy

## 2022-04-30 ENCOUNTER — Telehealth: Payer: 59 | Admitting: Physician Assistant

## 2022-04-30 DIAGNOSIS — R29818 Other symptoms and signs involving the nervous system: Secondary | ICD-10-CM | POA: Insufficient documentation

## 2022-04-30 DIAGNOSIS — M6281 Muscle weakness (generalized): Secondary | ICD-10-CM | POA: Insufficient documentation

## 2022-04-30 DIAGNOSIS — I110 Hypertensive heart disease with heart failure: Secondary | ICD-10-CM | POA: Diagnosis not present

## 2022-04-30 DIAGNOSIS — J9601 Acute respiratory failure with hypoxia: Secondary | ICD-10-CM | POA: Diagnosis not present

## 2022-04-30 DIAGNOSIS — R509 Fever, unspecified: Secondary | ICD-10-CM | POA: Diagnosis not present
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
## 2022-04-30 NOTE — Patient Instructions (Signed)
Jordan Jensen, thank you for joining Jordan Meres, PA-C for today's virtual visit.  While this provider is not your primary care provider (PCP), if your PCP is located in our provider database this encounter information will be shared with them immediately following your visit.   A [facility] MyChart account gives you access to today's visit and all your visits, tests, and labs performed at [facility] " click here if you don't have a [facility] MyChart account or go to [email or web address]  Consent: (Patient) Jordan Jensen provided verbal consent for this virtual visit at the beginning of the encounter.  Current Medications:  Current Outpatient Medications:    acetaminophen (TYLENOL) 325 MG tablet, Take 2 tablets (650 mg total) by mouth every 6 (six) hours as needed for mild pain (or Fever >/= 101)., Disp: , Rfl:    acetaminophen (TYLENOL) 500 MG tablet, Take 500 mg by mouth every 6 (six) hours as needed for moderate pain., Disp: , Rfl:    albuterol (PROVENTIL) (2.5 MG/3ML) 0.083% nebulizer solution, Take 3 mLs (2.5 mg total) by nebulization every 6 (six) hours as needed for wheezing or shortness of breath., Disp: 360 mL, Rfl: 5   albuterol (VENTOLIN HFA) 108 (90 Base) MCG/ACT inhaler, Inhale 2 puffs into the lungs every 6 (six) hours as needed for wheezing or shortness of breath., Disp: 1 each, Rfl: 5   alclomethasone (ACLOVATE) 0.05 % cream, Apply topically 2 (two) times daily as needed., Disp: , Rfl:    alfuzosin (UROXATRAL) 10 MG 24 hr tablet, Take 1 tablet (10 mg total) by mouth at bedtime., Disp: 30 tablet, Rfl: 11   budesonide-formoterol (SYMBICORT) 160-4.5 MCG/ACT inhaler, Inhale 2 puffs into the lungs in the morning and at bedtime., Disp: 1 each, Rfl: 3   cetirizine (ZYRTEC) 10 MG tablet, Take 10 mg by mouth daily as needed for allergies., Disp: , Rfl:    diclofenac Sodium (VOLTAREN) 1 % GEL, Apply 2 g topically as needed., Disp: , Rfl:    fluticasone (FLONASE)  50 MCG/ACT nasal spray, Place 2 sprays into both nostrils daily., Disp: 16 g, Rfl: 3   lidocaine (LIDODERM) 5 %, Place 1 patch onto the skin daily. Remove & Discard patch within 12 hours or as directed by MD, Disp: 5 patch, Rfl: 0   methocarbamol (ROBAXIN) 500 MG tablet, Take 1 tablet (500 mg total) by mouth every 8 (eight) hours as needed for muscle spasms., Disp: 30 tablet, Rfl: 0   metoprolol succinate (TOPROL XL) 25 MG 24 hr tablet, Take 1 tablet (25 mg total) by mouth daily., Disp: 90 tablet, Rfl: 3   montelukast (SINGULAIR) 10 MG tablet, Take 1 tablet (10 mg total) by mouth at bedtime., Disp: 30 tablet, Rfl: 3   naproxen (NAPROSYN) 500 MG tablet, Take 1 tablet (500 mg total) by mouth 2 (two) times daily., Disp: 30 tablet, Rfl: 0   potassium chloride SA (KLOR-CON M) 20 MEQ tablet, Take 1 tablet (20 mEq total) by mouth 2 (two) times daily., Disp: 60 tablet, Rfl: 3   rosuvastatin (CRESTOR) 20 MG tablet, Take 1 tablet (20 mg total) by mouth daily., Disp: 90 tablet, Rfl: 3   Semaglutide,0.25 or 0.5MG /DOS, (OZEMPIC, 0.25 OR 0.5 MG/DOSE,) 2 MG/3ML SOPN, Inject 0.25 mg into the skin once a week., Disp: 3 mL, Rfl: 0   spironolactone (ALDACTONE) 25 MG tablet, Take 1 tablet (25 mg total) by mouth daily., Disp: 90 tablet, Rfl: 3   Medications ordered in this encounter:  No  orders of the defined types were placed in this encounter.    *If you need refills on other medications prior to your next appointment, please contact your pharmacy*  Follow-Up: Call back or seek an in-person evaluation if the symptoms worsen or if the condition fails to improve as anticipated.  [facility] [phone]  Other Instructions Please seek an in person evaluation today to further assess your symptoms   If you have been instructed to have an in-person evaluation today at a local Urgent Care facility, please use the link below. It will take you to a list of all of our available [facility],  including address, phone number and hours of operation. Please do not delay care.  [facility]  If you or a family member do not have a primary care provider, use the link below to schedule a visit and establish care. When you choose a [facility] primary care physician or advanced practice provider, you gain a long-term partner in health. Find a Primary Care Provider  Learn more about [facility] in-office and virtual care options: [facility] - Get Care Now
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
## 2022-05-01 ENCOUNTER — Emergency Department (HOSPITAL_COMMUNITY): Payer: 59

## 2022-05-01 ENCOUNTER — Inpatient Hospital Stay (HOSPITAL_COMMUNITY)
Admission: EM | Admit: 2022-05-01 | Discharge: 2022-05-10 | DRG: 291 | Disposition: A | Discharge status: Home Health | Payer: 59 | Attending: Family Medicine | Admitting: Family Medicine

## 2022-05-01 ENCOUNTER — Other Ambulatory Visit: Payer: Self-pay

## 2022-05-01 DIAGNOSIS — I5023 Acute on chronic systolic (congestive) heart failure: Secondary | ICD-10-CM

## 2022-05-01 DIAGNOSIS — G4733 Obstructive sleep apnea (adult) (pediatric): Secondary | ICD-10-CM | POA: Diagnosis present

## 2022-05-01 DIAGNOSIS — I2510 Atherosclerotic heart disease of native coronary artery without angina pectoris: Secondary | ICD-10-CM | POA: Diagnosis present

## 2022-05-01 DIAGNOSIS — B961 Klebsiella pneumoniae [K. pneumoniae] as the cause of diseases classified elsewhere: Secondary | ICD-10-CM | POA: Diagnosis present

## 2022-05-01 DIAGNOSIS — Z888 Allergy status to other drugs, medicaments and biological substances status: Secondary | ICD-10-CM

## 2022-05-01 DIAGNOSIS — E873 Alkalosis: Secondary | ICD-10-CM | POA: Diagnosis not present

## 2022-05-01 DIAGNOSIS — G9589 Other specified diseases of spinal cord: Secondary | ICD-10-CM | POA: Diagnosis not present

## 2022-05-01 DIAGNOSIS — R262 Difficulty in walking, not elsewhere classified: Secondary | ICD-10-CM

## 2022-05-01 DIAGNOSIS — Z6841 Body Mass Index (BMI) 40.0 and over, adult: Secondary | ICD-10-CM | POA: Diagnosis not present

## 2022-05-01 DIAGNOSIS — E662 Morbid (severe) obesity with alveolar hypoventilation: Secondary | ICD-10-CM | POA: Diagnosis present

## 2022-05-01 DIAGNOSIS — Z993 Dependence on wheelchair: Secondary | ICD-10-CM

## 2022-05-01 DIAGNOSIS — R600 Localized edema: Secondary | ICD-10-CM

## 2022-05-01 DIAGNOSIS — J9621 Acute and chronic respiratory failure with hypoxia: Secondary | ICD-10-CM | POA: Diagnosis present

## 2022-05-01 DIAGNOSIS — I77810 Thoracic aortic ectasia: Secondary | ICD-10-CM | POA: Diagnosis present

## 2022-05-01 DIAGNOSIS — Z96641 Presence of right artificial hip joint: Secondary | ICD-10-CM | POA: Diagnosis present

## 2022-05-01 DIAGNOSIS — I110 Hypertensive heart disease with heart failure: Secondary | ICD-10-CM | POA: Diagnosis present

## 2022-05-01 DIAGNOSIS — E785 Hyperlipidemia, unspecified: Secondary | ICD-10-CM | POA: Diagnosis present

## 2022-05-01 DIAGNOSIS — I509 Heart failure, unspecified: Principal | ICD-10-CM

## 2022-05-01 DIAGNOSIS — J810 Acute pulmonary edema: Secondary | ICD-10-CM

## 2022-05-01 DIAGNOSIS — I472 Ventricular tachycardia, unspecified: Secondary | ICD-10-CM | POA: Diagnosis not present

## 2022-05-01 DIAGNOSIS — J9622 Acute and chronic respiratory failure with hypercapnia: Secondary | ICD-10-CM | POA: Diagnosis present

## 2022-05-01 DIAGNOSIS — G959 Disease of spinal cord, unspecified: Secondary | ICD-10-CM | POA: Diagnosis present

## 2022-05-01 DIAGNOSIS — E119 Type 2 diabetes mellitus without complications: Secondary | ICD-10-CM | POA: Diagnosis present

## 2022-05-01 DIAGNOSIS — Z91040 Latex allergy status: Secondary | ICD-10-CM

## 2022-05-01 DIAGNOSIS — G8220 Paraplegia, unspecified: Secondary | ICD-10-CM | POA: Diagnosis present

## 2022-05-01 DIAGNOSIS — L89312 Pressure ulcer of right buttock, stage 2: Secondary | ICD-10-CM | POA: Diagnosis present

## 2022-05-01 DIAGNOSIS — I493 Ventricular premature depolarization: Secondary | ICD-10-CM | POA: Diagnosis not present

## 2022-05-01 DIAGNOSIS — Z560 Unemployment, unspecified: Secondary | ICD-10-CM

## 2022-05-01 DIAGNOSIS — I2729 Other secondary pulmonary hypertension: Secondary | ICD-10-CM | POA: Diagnosis present

## 2022-05-01 DIAGNOSIS — Z87891 Personal history of nicotine dependence: Secondary | ICD-10-CM

## 2022-05-01 DIAGNOSIS — R7881 Bacteremia: Secondary | ICD-10-CM | POA: Diagnosis present

## 2022-05-01 DIAGNOSIS — Z7951 Long term (current) use of inhaled steroids: Secondary | ICD-10-CM

## 2022-05-01 DIAGNOSIS — E876 Hypokalemia: Secondary | ICD-10-CM | POA: Diagnosis present

## 2022-05-01 DIAGNOSIS — B957 Other staphylococcus as the cause of diseases classified elsewhere: Secondary | ICD-10-CM | POA: Diagnosis present

## 2022-05-01 DIAGNOSIS — R7303 Prediabetes: Secondary | ICD-10-CM | POA: Diagnosis not present

## 2022-05-01 DIAGNOSIS — N3000 Acute cystitis without hematuria: Secondary | ICD-10-CM | POA: Diagnosis present

## 2022-05-01 DIAGNOSIS — I5021 Acute systolic (congestive) heart failure: Secondary | ICD-10-CM | POA: Diagnosis not present

## 2022-05-01 DIAGNOSIS — L89322 Pressure ulcer of left buttock, stage 2: Secondary | ICD-10-CM | POA: Diagnosis present

## 2022-05-01 DIAGNOSIS — R532 Functional quadriplegia: Secondary | ICD-10-CM | POA: Diagnosis present

## 2022-05-01 DIAGNOSIS — Z981 Arthrodesis status: Secondary | ICD-10-CM

## 2022-05-01 DIAGNOSIS — I5043 Acute on chronic combined systolic (congestive) and diastolic (congestive) heart failure: Secondary | ICD-10-CM | POA: Diagnosis present

## 2022-05-01 DIAGNOSIS — I428 Other cardiomyopathies: Secondary | ICD-10-CM | POA: Diagnosis present

## 2022-05-01 DIAGNOSIS — N3001 Acute cystitis with hematuria: Secondary | ICD-10-CM | POA: Diagnosis present

## 2022-05-01 DIAGNOSIS — J9601 Acute respiratory failure with hypoxia: Secondary | ICD-10-CM | POA: Diagnosis present

## 2022-05-01 DIAGNOSIS — Z7901 Long term (current) use of anticoagulants: Secondary | ICD-10-CM | POA: Diagnosis not present

## 2022-05-01 DIAGNOSIS — Z79899 Other long term (current) drug therapy: Secondary | ICD-10-CM | POA: Diagnosis not present

## 2022-05-01 DIAGNOSIS — Z5982 Transportation insecurity: Secondary | ICD-10-CM

## 2022-05-01 DIAGNOSIS — L8990 Pressure ulcer of unspecified site, unspecified stage: Secondary | ICD-10-CM | POA: Insufficient documentation

## 2022-05-01 DIAGNOSIS — Z91199 Patient's noncompliance with other medical treatment and regimen due to unspecified reason: Secondary | ICD-10-CM

## 2022-05-01 DIAGNOSIS — I10 Essential (primary) hypertension: Secondary | ICD-10-CM | POA: Diagnosis present

## 2022-05-01 DIAGNOSIS — Z7985 Long-term (current) use of injectable non-insulin antidiabetic drugs: Secondary | ICD-10-CM

## 2022-05-01 DIAGNOSIS — Z86718 Personal history of other venous thrombosis and embolism: Secondary | ICD-10-CM

## 2022-05-01 DIAGNOSIS — J9602 Acute respiratory failure with hypercapnia: Secondary | ICD-10-CM | POA: Diagnosis not present

## 2022-05-01 DIAGNOSIS — Z833 Family history of diabetes mellitus: Secondary | ICD-10-CM

## 2022-05-01 DIAGNOSIS — I890 Lymphedema, not elsewhere classified: Secondary | ICD-10-CM | POA: Diagnosis present

## 2022-05-01 DIAGNOSIS — K219 Gastro-esophageal reflux disease without esophagitis: Secondary | ICD-10-CM | POA: Diagnosis present

## 2022-05-01 LAB — CBC WITH DIFFERENTIAL/PLATELET
Abs Immature Granulocytes: 0.05 10*3/uL (ref 0.00–0.07)
Basophils Absolute: 0.1 10*3/uL (ref 0.0–0.1)
Basophils Relative: 1 %
Eosinophils Absolute: 0.2 10*3/uL (ref 0.0–0.5)
Eosinophils Relative: 2 %
HCT: 47 % (ref 39.0–52.0)
Hemoglobin: 14.4 g/dL (ref 13.0–17.0)
Immature Granulocytes: 0 %
Lymphocytes Relative: 15 %
Lymphs Abs: 1.9 10*3/uL (ref 0.7–4.0)
MCH: 28.7 pg (ref 26.0–34.0)
MCHC: 30.6 g/dL (ref 30.0–36.0)
MCV: 93.8 fL (ref 80.0–100.0)
Monocytes Absolute: 0.8 10*3/uL (ref 0.1–1.0)
Monocytes Relative: 6 %
Neutro Abs: 9.7 10*3/uL — ABNORMAL HIGH (ref 1.7–7.7)
Neutrophils Relative %: 76 %
Platelets: 154 10*3/uL (ref 150–400)
RBC: 5.01 MIL/uL (ref 4.22–5.81)
RDW: 14.7 % (ref 11.5–15.5)
WBC: 12.7 10*3/uL — ABNORMAL HIGH (ref 4.0–10.5)
nRBC: 0 % (ref 0.0–0.2)

## 2022-05-01 LAB — COMPREHENSIVE METABOLIC PANEL
ALT: 18 U/L (ref 0–44)
AST: 20 U/L (ref 15–41)
Albumin: 3.6 g/dL (ref 3.5–5.0)
Alkaline Phosphatase: 51 U/L (ref 38–126)
Anion gap: 7 (ref 5–15)
BUN: 9 mg/dL (ref 6–20)
CO2: 31 mmol/L (ref 22–32)
Calcium: 8.8 mg/dL — ABNORMAL LOW (ref 8.9–10.3)
Chloride: 100 mmol/L (ref 98–111)
Creatinine, Ser: 0.7 mg/dL (ref 0.61–1.24)
GFR, Estimated: >60 mL/min (ref >60)
Glucose, Bld: 111 mg/dL — ABNORMAL HIGH (ref 70–99)
Potassium: 4.3 mmol/L (ref 3.5–5.1)
Sodium: 138 mmol/L (ref 135–145)
Total Bilirubin: 0.7 mg/dL (ref 0.3–1.2)
Total Protein: 7.9 g/dL (ref 6.5–8.1)

## 2022-05-01 LAB — URINALYSIS, W/ REFLEX TO CULTURE (INFECTION SUSPECTED)
Bilirubin Urine: NEGATIVE
Glucose, UA: NEGATIVE mg/dL
Hgb urine dipstick: NEGATIVE
Ketones, ur: NEGATIVE mg/dL
Leukocytes,Ua: NEGATIVE
Nitrite: NEGATIVE
Protein, ur: 30 mg/dL — AB
Specific Gravity, Urine: 1.027 (ref 1.005–1.030)
pH: 5 (ref 5.0–8.0)

## 2022-05-01 LAB — BLOOD GAS, VENOUS
Acid-Base Excess: 8.3 mmol/L — ABNORMAL HIGH (ref 0.0–2.0)
Bicarbonate: 40.3 mmol/L — ABNORMAL HIGH (ref 20.0–28.0)
Drawn by: 64452
O2 Saturation: 20.6 %
Patient temperature: 36.8
pCO2, Ven: 93 mmHg (ref 44–60)
pH, Ven: 7.24 — ABNORMAL LOW (ref 7.25–7.43)
pO2, Ven: <31 mmHg — CL (ref 32–45)

## 2022-05-01 LAB — GLUCOSE, CAPILLARY
Glucose-Capillary: 103 mg/dL — ABNORMAL HIGH (ref 70–99)
Glucose-Capillary: 76 mg/dL (ref 70–99)

## 2022-05-01 LAB — BRAIN NATRIURETIC PEPTIDE: B Natriuretic Peptide: 137 pg/mL — ABNORMAL HIGH (ref 0.0–100.0)

## 2022-05-01 LAB — PROTIME-INR
INR: 1.2 (ref 0.8–1.2)
Prothrombin Time: 14.6 seconds (ref 11.4–15.2)

## 2022-05-01 LAB — LACTIC ACID, PLASMA
Lactic Acid, Venous: 0.7 mmol/L (ref 0.5–1.9)
Lactic Acid, Venous: 0.6 mmol/L (ref 0.5–1.9)

## 2022-05-01 LAB — APTT: aPTT: 28 seconds (ref 24–36)

## 2022-05-01 MED ORDER — ALBUTEROL SULFATE (2.5 MG/3ML) 0.083% IN NEBU: 2.5000 mg | INHALATION_SOLUTION | RESPIRATORY_TRACT | Status: DC | PRN

## 2022-05-01 MED ORDER — ACETAMINOPHEN 325 MG PO TABS
650.0000 mg | ORAL_TABLET | Freq: Four times a day (QID) | ORAL | Status: DC | PRN
  Administered 2022-05-04 – 2022-05-05 (×2): 650 mg via ORAL
  Filled 2022-05-01 (×2): qty 2

## 2022-05-01 MED ORDER — FUROSEMIDE 10 MG/ML IJ SOLN
60.0000 mg | Freq: Two times a day (BID) | INTRAMUSCULAR | Status: DC
  Administered 2022-05-02 – 2022-05-08 (×13): 60 mg via INTRAVENOUS
  Filled 2022-05-01 (×13): qty 6

## 2022-05-01 MED ORDER — ORAL CARE MOUTH RINSE: 15.0000 mL | OROMUCOSAL | Status: DC | PRN

## 2022-05-01 MED ORDER — ORAL CARE MOUTH RINSE
15.0000 mL | OROMUCOSAL | Status: DC
  Administered 2022-05-01 – 2022-05-10 (×24): 15 mL via OROMUCOSAL

## 2022-05-01 MED ORDER — INSULIN ASPART 100 UNIT/ML IJ SOLN: 0.0000 [IU] | Freq: Every day | INTRAMUSCULAR | Status: DC

## 2022-05-01 MED ORDER — ENOXAPARIN SODIUM 100 MG/ML IJ SOSY
100.0000 mg | PREFILLED_SYRINGE | INTRAMUSCULAR | Status: DC
  Administered 2022-05-01 – 2022-05-08 (×8): 100 mg via SUBCUTANEOUS
  Filled 2022-05-01 (×8): qty 1

## 2022-05-01 MED ORDER — ROSUVASTATIN CALCIUM 20 MG PO TABS
20.0000 mg | ORAL_TABLET | Freq: Every day | ORAL | Status: DC
  Administered 2022-05-03 – 2022-05-10 (×8): 20 mg via ORAL
  Filled 2022-05-01 (×8): qty 1

## 2022-05-01 MED ORDER — SPIRONOLACTONE 25 MG PO TABS
25.0000 mg | ORAL_TABLET | Freq: Every day | ORAL | Status: DC
  Administered 2022-05-03: 25 mg via ORAL
  Filled 2022-05-01: qty 1

## 2022-05-01 MED ORDER — MOMETASONE FURO-FORMOTEROL FUM 200-5 MCG/ACT IN AERO
2.0000 | INHALATION_SPRAY | Freq: Two times a day (BID) | RESPIRATORY_TRACT | Status: DC
  Administered 2022-05-02 – 2022-05-10 (×14): 2 via RESPIRATORY_TRACT
  Filled 2022-05-01 (×2): qty 8.8

## 2022-05-01 MED ORDER — CHLORHEXIDINE GLUCONATE CLOTH 2 % EX PADS
6.0000 | MEDICATED_PAD | Freq: Every day | CUTANEOUS | Status: DC
  Administered 2022-05-01 – 2022-05-04 (×4): 6 via TOPICAL

## 2022-05-01 MED ORDER — HYDRALAZINE HCL 20 MG/ML IJ SOLN: 5.0000 mg | INTRAMUSCULAR | Status: DC | PRN

## 2022-05-01 MED ORDER — FUROSEMIDE 10 MG/ML IJ SOLN
40.0000 mg | Freq: Once | INTRAMUSCULAR | Status: AC
  Administered 2022-05-01: 40 mg via INTRAVENOUS
  Filled 2022-05-01: qty 4

## 2022-05-01 MED ORDER — METOPROLOL SUCCINATE ER 25 MG PO TB24
25.0000 mg | ORAL_TABLET | Freq: Every day | ORAL | Status: DC
  Administered 2022-05-03 – 2022-05-10 (×8): 25 mg via ORAL
  Filled 2022-05-01 (×8): qty 1

## 2022-05-01 MED ORDER — ENOXAPARIN SODIUM 40 MG/0.4ML IJ SOSY
40.0000 mg | PREFILLED_SYRINGE | INTRAMUSCULAR | Status: DC
Start: 2022-05-01 — End: 2022-05-01

## 2022-05-01 MED ORDER — MONTELUKAST SODIUM 10 MG PO TABS
10.0000 mg | ORAL_TABLET | Freq: Every day | ORAL | Status: DC
  Administered 2022-05-02 – 2022-05-09 (×8): 10 mg via ORAL
  Filled 2022-05-01 (×9): qty 1

## 2022-05-01 MED ORDER — INSULIN ASPART 100 UNIT/ML IJ SOLN
0.0000 [IU] | Freq: Three times a day (TID) | INTRAMUSCULAR | Status: DC
  Administered 2022-05-03 – 2022-05-07 (×7): 2 [IU] via SUBCUTANEOUS
  Administered 2022-05-08 (×2): 3 [IU] via SUBCUTANEOUS
  Administered 2022-05-09: 2 [IU] via SUBCUTANEOUS
  Administered 2022-05-09: 3 [IU] via SUBCUTANEOUS
  Administered 2022-05-10: 2 [IU] via SUBCUTANEOUS

## 2022-05-01 MED ORDER — FLUTICASONE PROPIONATE 50 MCG/ACT NA SUSP
2.0000 | Freq: Every day | NASAL | Status: DC
  Administered 2022-05-02 – 2022-05-10 (×9): 2 via NASAL
  Filled 2022-05-01: qty 16

## 2022-05-01 NOTE — ED Triage Notes (Signed)
Pt called EMS d/t dark colored urine & foul odor from urine.  Pt c/o some low back pain in the center that relieves some when he urinates.  States he has had more frequency & urgency.
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
## 2022-05-01 NOTE — ED Notes (Signed)
US at this time.

## 2022-05-02 ENCOUNTER — Encounter (HOSPITAL_COMMUNITY): Payer: Self-pay | Admitting: Internal Medicine

## 2022-05-02 ENCOUNTER — Inpatient Hospital Stay (HOSPITAL_COMMUNITY): Payer: 59

## 2022-05-02 DIAGNOSIS — J9602 Acute respiratory failure with hypercapnia: Secondary | ICD-10-CM | POA: Diagnosis not present

## 2022-05-02 DIAGNOSIS — I5021 Acute systolic (congestive) heart failure: Secondary | ICD-10-CM | POA: Diagnosis not present

## 2022-05-02 DIAGNOSIS — J9601 Acute respiratory failure with hypoxia: Secondary | ICD-10-CM

## 2022-05-02 LAB — BLOOD GAS, VENOUS
Acid-Base Excess: 18.8 mmol/L — ABNORMAL HIGH (ref 0.0–2.0)
Bicarbonate: 50.7 mmol/L — ABNORMAL HIGH (ref 20.0–28.0)
Drawn by: 60259
FIO2: 28 %
O2 Saturation: 56.7 %
Patient temperature: 37
pCO2, Ven: 103 mmHg (ref 44–60)
pH, Ven: 7.3 (ref 7.25–7.43)
pO2, Ven: 33 mmHg (ref 32–45)

## 2022-05-02 LAB — ECHOCARDIOGRAM COMPLETE
Area-P 1/2: 4.21 cm2
Calc EF: 24.5 %
Height: 71 in
Single Plane A2C EF: 21.2 %
Single Plane A4C EF: 19.1 %
Weight: 8190.53 oz

## 2022-05-02 LAB — BLOOD GAS, ARTERIAL
Acid-Base Excess: 11 mmol/L — ABNORMAL HIGH (ref 0.0–2.0)
Acid-Base Excess: 12.8 mmol/L — ABNORMAL HIGH (ref 0.0–2.0)
Bicarbonate: 42.2 mmol/L — ABNORMAL HIGH (ref 20.0–28.0)
Bicarbonate: 42.8 mmol/L — ABNORMAL HIGH (ref 20.0–28.0)
Drawn by: 22766
Drawn by: 22766
FIO2: 40 %
FIO2: 40 %
O2 Saturation: 98.2 %
O2 Saturation: 99.3 %
Patient temperature: 36.8
Patient temperature: 37.3
pCO2 arterial: 93 mmHg (ref 32–48)
pCO2 arterial: 84 mmHg (ref 32–48)
pH, Arterial: 7.26 — ABNORMAL LOW (ref 7.35–7.45)
pH, Arterial: 7.32 — ABNORMAL LOW (ref 7.35–7.45)
pO2, Arterial: 84 mmHg (ref 83–108)
pO2, Arterial: 100 mmHg (ref 83–108)

## 2022-05-02 LAB — BLOOD CULTURE ID PANEL (REFLEXED) - BCID2

## 2022-05-02 LAB — BASIC METABOLIC PANEL
Anion gap: 7 (ref 5–15)
BUN: 8 mg/dL (ref 6–20)
CO2: 36 mmol/L — ABNORMAL HIGH (ref 22–32)
Calcium: 8.9 mg/dL (ref 8.9–10.3)
Chloride: 97 mmol/L — ABNORMAL LOW (ref 98–111)
Creatinine, Ser: 0.91 mg/dL (ref 0.61–1.24)
GFR, Estimated: >60 mL/min (ref >60)
Glucose, Bld: 103 mg/dL — ABNORMAL HIGH (ref 70–99)
Potassium: 4.9 mmol/L (ref 3.5–5.1)
Sodium: 140 mmol/L (ref 135–145)

## 2022-05-02 LAB — CBC
HCT: 48.3 % (ref 39.0–52.0)
Hemoglobin: 14.4 g/dL (ref 13.0–17.0)
MCH: 29 pg (ref 26.0–34.0)
MCHC: 29.8 g/dL — ABNORMAL LOW (ref 30.0–36.0)
MCV: 97.4 fL (ref 80.0–100.0)
Platelets: 135 10*3/uL — ABNORMAL LOW (ref 150–400)
RBC: 4.96 MIL/uL (ref 4.22–5.81)
RDW: 14.6 % (ref 11.5–15.5)
WBC: 14.5 10*3/uL — ABNORMAL HIGH (ref 4.0–10.5)
nRBC: 0 % (ref 0.0–0.2)

## 2022-05-02 LAB — GLUCOSE, CAPILLARY
Glucose-Capillary: 108 mg/dL — ABNORMAL HIGH (ref 70–99)
Glucose-Capillary: 78 mg/dL (ref 70–99)
Glucose-Capillary: 84 mg/dL (ref 70–99)
Glucose-Capillary: 87 mg/dL (ref 70–99)
Glucose-Capillary: 94 mg/dL (ref 70–99)

## 2022-05-02 LAB — MRSA NEXT GEN BY PCR, NASAL: MRSA by PCR Next Gen: NOT DETECTED

## 2022-05-02 LAB — CULTURE, BLOOD (ROUTINE X 2)

## 2022-05-02 LAB — URINE CULTURE

## 2022-05-02 MED ORDER — VANCOMYCIN HCL 2000 MG/400ML IV SOLN
2000.0000 mg | Freq: Once | INTRAVENOUS | Status: AC
  Administered 2022-05-02: 2000 mg via INTRAVENOUS
  Filled 2022-05-02: qty 400

## 2022-05-02 MED ORDER — VANCOMYCIN HCL 1500 MG/300ML IV SOLN
1500.0000 mg | Freq: Three times a day (TID) | INTRAVENOUS | Status: DC
  Administered 2022-05-03 (×2): 1500 mg via INTRAVENOUS
  Filled 2022-05-02 (×2): qty 300

## 2022-05-02 MED ORDER — PERFLUTREN LIPID MICROSPHERE
1.0000 mL | INTRAVENOUS | Status: AC | PRN
  Administered 2022-05-02: 6 mL via INTRAVENOUS

## 2022-05-02 NOTE — TOC Progression Note (Signed)
  Transition of Care [facility]) Screening Note   Patient Details  Name: Jordan Jensen Date of Birth: May 14, 1970   Transition of Care [facility]) CM/SW Contact:    Elliot Gault, LCSW Phone Number: 05/02/2022, 9:02 AM    Pt reviewed with MD who states he may need transfer to [facility]. CHF education added to AVS. Community resource information delivered to pt's room to address SDOH. Anticipating pt may have HH/DME needs at dc. TOC can re-assess when pt stable.  Transition of Care Department [facility]) has reviewed patient and no other immediate TOC needs have been identified at this time. We will continue to monitor patient advancement through interdisciplinary progression rounds. If new patient transition needs arise, please place a TOC consult.

## 2022-05-02 NOTE — Progress Notes (Signed)
OT Cancellation Note  Patient Details Name: ASAHD CAN MRN: [id number] DOB: 1970-03-18   Cancelled Treatment:       Attempted to complete evaluation, unable due to pt being put on Bipap- RN requesting OT/PT come back later. Per SW note pt is being transferred to [facility].   Bevelyn Ngo OTR/L  05/02/2022, 9:21 AM

## 2022-05-02 NOTE — Progress Notes (Signed)
Pharmacy Antibiotic Note  Jordan Jensen is a 52 y.o. male admitted on 05/01/2022 with bacteremia.  Pharmacy has been consulted for Vancomycin dosing. BCX anaerobic bottle + GPC  Plan: Vancomycin 2000 mg IV now, then 1500mg  IV Q 8 hrs. Goal AUC 400-550. Expected AUC: 441 SCr used: 0.91  F/U cxs and clinical progress Monitor V/S, labs and levels as indicated  Height: 5\' 11"  (180.3 cm) Weight: (!) 232.2 kg (511 lb 14.5 oz) IBW/kg (Calculated) : 75.3  Temp (24hrs), Avg:98.6 F (37 C), Min:97.7 F (36.5 C), Max:99.2 F (37.3 C)  Recent Labs  Lab 05/01/22 1222 05/01/22 1402 05/02/22 0509  WBC 12.7*  --  14.5*  CREATININE 0.70  --  0.91  LATICACIDVEN 0.7 0.6  --     Estimated Creatinine Clearance: 185.5 mL/min (by C-G formula based on SCr of 0.91 mg/dL).    Allergies  Allergen Reactions   Jardiance [Empagliflozin]     Presyncope; Dry mouth and skin   Latex Itching and Rash    cellulitis    Antimicrobials this admission: Vancomycin 4/26 >>   Microbiology results: 4/25 BCx: GPC in anaerobic bottle 4/25 UCx: 10K CFU/ml K. Pneumo  425  MRSA PCR: negative  Thank you for allowing pharmacy to be a part of this patient's care.  Elder Cyphers, BS Pharm D, BCPS Clinical Pharmacist 05/02/2022 4:06 PM
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
## 2022-05-02 NOTE — Progress Notes (Signed)
Placed patient in AVAPs mode on V60.  Patient seems to have been taking shallow breaths most of the night.  Rate still climbing into the upper 30s quite much.  Since switching, patient seems more relaxed in his breathing pattern and rate has came down considerably.  Current rate now is 16.  Vt now is 595.  Patient tolerating this mode much better.

## 2022-05-02 NOTE — Plan of Care (Signed)

## 2022-05-02 NOTE — Progress Notes (Signed)
PT Cancellation Note  Patient Details Name: Jordan Jensen MRN: [id number] DOB: 08-10-1970   Cancelled Treatment:    Reason Eval/Treat Not Completed: Medical issues which prohibited therapy;Patient not medically ready; RN stated patient on bipap and to hold PT evaluation for now. Will check back later if time permits.  9:33 AM, 05/02/22 Wyman Songster PT, DPT Physical Therapist at [facility]
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
## 2022-05-02 NOTE — Progress Notes (Signed)
PROGRESS NOTE    Jordan Jensen  ZOX:[id number] DOB: 1970-12-19 DOA: 05/01/2022 PCP: Billie Lade, MD   Brief Narrative: Jordan Jensen is a 52 y.o. male with a history of HFrEF, morbid obesity, chronic lymphedema, hypertension, hyperlipidemia, OSA. Patient presented secondary to progressively worsening dyspnea and abnormal urine. Patient found to have likely acute heart failure. Cardiology consulted. Diuresis started. Hospitalization complicated by worsening respiratory failure with hypoxia and hypercapnia requiring initiation of BiPAP.   Assessment and Plan:  Acute on chronic combined systolic and diastolic heart failure Present on admission. Patient with known severe systolic heart failure and follows with heart failure clinic. Chest x-ray on admission with vascular congestion without evidence of pulmonary edema. BNP is around patient's prior numbers, complicated by severe obesity. Patient started on Lasix IV diuresis on admission. Repeat Transthoracic Echocardiogram significant for stable LVEF of 20-25%. -Continue Lasix IV -Daily BMP while on IV diuresis -Daily weights and strict in/out  Acute respiratory with hypoxia and hypercapnia Complicated by history of OSA and, although not officially diagnosed, patient likely has a component of OHS. Patient placed on BiPAP shortly after admission secondary to hypercapnia noted on VBG. Patient continues to have worsening respiratory failure with hypercapnia while on BiPAP. Patient confirms, if needed, he would desire intubation and mechanical ventilation. -Continue BiPAP -PCCM consult  Lymphedema Noted. Complicated by underlying severe heart failure as well. Unlikely diuresis will improve this much. Appears stable. Venous dopplers ordered on admission.  Hyperlipidemia -Continue Crestor  OSA Patient is on CPAP as an outpatient. Will likely require BiPAP at night once stabilized.  Functional quadriplegia PT/OT consulted on admission.    Morbid obesity Contributing significantly to current presentation and affecting ability to manage. Estimated body mass index is 71.4 kg/m as calculated from the following:   Height as of this encounter: 5\' 11"  (1.803 m).   Weight as of this encounter: 232.2 kg.  Pressure injury Buttocks. Present on admission.   DVT prophylaxis: Lovenox Code Status:   Code Status: Full Code Family Communication: None at bedside Disposition Plan: Discharge pending improvement in respiratory failure. Unlikely discharge for several days.   Consultants:  PCCM Cardiology  Procedures:  4/26: Transthoracic Echocardiogram  Antimicrobials: None    Subjective: Patient reports no dyspnea while laying in bed at this time. No chest pain.  Objective: BP 106/71   Pulse 63   Temp 98.8 F (37.1 C) (Axillary)   Resp (!) 22   Ht 5\' 11"  (1.803 m)   Wt (!) 232.2 kg   SpO2 95%   BMI 71.40 kg/m   Examination:  General exam: Appears calm and comfortable Respiratory system: Clear and diminished to auscultation. Respiratory effort decreased on BiPAP Cardiovascular system: S1 & S2 heard, RRR. Gastrointestinal system: Abdomen is nondistended, soft and nontender. Normal bowel sounds heard. Central nervous system: Sleeping but easily arouses and is oriented. Follows commands. Musculoskeletal: Significant bilateral LE edema. Skin: No cyanosis. No rashes   Data Reviewed: I have personally reviewed following labs and imaging studies  CBC Lab Results  Component Value Date   WBC 14.5 (H) 05/02/2022   RBC 4.96 05/02/2022   HGB 14.4 05/02/2022   HCT 48.3 05/02/2022   MCV 97.4 05/02/2022   MCH 29.0 05/02/2022   PLT 135 (L) 05/02/2022   MCHC 29.8 (L) 05/02/2022   RDW 14.6 05/02/2022   LYMPHSABS 1.9 05/01/2022   MONOABS 0.8 05/01/2022   EOSABS 0.2 05/01/2022   BASOSABS 0.1 05/01/2022     Last metabolic panel  Lab Results  Component Value Date   NA 140 05/02/2022   K 4.9 05/02/2022   CL 97  (L) 05/02/2022   CO2 36 (H) 05/02/2022   BUN 8 05/02/2022   CREATININE 0.91 05/02/2022   GLUCOSE 103 (H) 05/02/2022   GFRNONAA >60 05/02/2022   GFRAA >60 07/09/2018   CALCIUM 8.9 05/02/2022   PROT 7.9 05/01/2022   ALBUMIN 3.6 05/01/2022   LABGLOB 3.4 12/10/2021   AGRATIO 1.2 12/10/2021   BILITOT 0.7 05/01/2022   ALKPHOS 51 05/01/2022   AST 20 05/01/2022   ALT 18 05/01/2022   ANIONGAP 7 05/02/2022    GFR: Estimated Creatinine Clearance: 185.5 mL/min (by C-G formula based on SCr of 0.91 mg/dL).  No results found for this or any previous visit (from the past 240 hour(s)).    Radiology Studies: US Venous Img Lower Bilateral  Result Date: 05/01/2022 CLINICAL DATA:  Edema and shortness of breath. Prior history of DVT and varicose veins. EXAM: Bilateral LOWER EXTREMITY VENOUS DOPPLER ULTRASOUND TECHNIQUE: Gray-scale sonography with compression, as well as color and duplex ultrasound, were performed to evaluate the deep venous system(s) from the level of the common femoral vein through the popliteal and proximal calf veins. COMPARISON:  09/02/2021 FINDINGS: VENOUS Normal compressibility of the bilateral common femoral, superficial femoral, and popliteal veins, as well as the visualized calf veins. Visualized portions of profunda femoral vein and great saphenous vein unremarkable. No filling defects to suggest DVT on grayscale or color Doppler imaging. Doppler waveforms show normal direction of venous flow, normal respiratory plasticity and response to augmentation. Visualization of the calf veins is limited due to patient's body habitus. OTHER None. Limitations: Body habitus IMPRESSION: No evidence of acute deep venous thrombosis in the visualized lower extremity veins. Electronically Signed   By: Burman Nieves M.D.   On: 05/01/2022 16:48   DG Chest Port 1 View  Result Date: 05/01/2022 CLINICAL DATA:  Shortness of breath EXAM: PORTABLE CHEST 1 VIEW COMPARISON:  X-ray 03/13/2022 and older  FINDINGS: Enlarged cardiopericardial silhouette with stable widening of the mediastinum. Vascular congestion. No pneumothorax, effusion or edema. Overlapping cardiac leads. Fixation hardware seen of the lower cervical spine at the edge of the imaging field. IMPRESSION: Stable enlarged heart with widened mediastinum and vascular congestion. Electronically Signed   By: Karen Kays M.D.   On: 05/01/2022 10:33      LOS: 1 day    Jacquelin Hawking, MD [facility] 05/02/2022, 7:00 AM   If 7PM-7AM, please contact night-coverage [email or web address]

## 2022-05-02 NOTE — Progress Notes (Signed)
Informed by nursing that 2/4 bottles from blood cultures are positive for GPCs in clusters. No BCID available yet. Urine culture also resulted, but is significant for low-colony count of Klebsiella pneumonia. Will start Vancomycin IV empirically per pharmacy consult to cover for GPC bacteremia until BCID is available for more tailored therapy.  Jacquelin Hawking, MD [facility] 05/02/2022, 3:35 PM

## 2022-05-03 DIAGNOSIS — J9622 Acute and chronic respiratory failure with hypercapnia: Secondary | ICD-10-CM

## 2022-05-03 DIAGNOSIS — Z6841 Body Mass Index (BMI) 40.0 and over, adult: Secondary | ICD-10-CM

## 2022-05-03 DIAGNOSIS — J9621 Acute and chronic respiratory failure with hypoxia: Secondary | ICD-10-CM

## 2022-05-03 DIAGNOSIS — R600 Localized edema: Secondary | ICD-10-CM | POA: Diagnosis not present

## 2022-05-03 DIAGNOSIS — R262 Difficulty in walking, not elsewhere classified: Secondary | ICD-10-CM | POA: Diagnosis not present

## 2022-05-03 DIAGNOSIS — J9601 Acute respiratory failure with hypoxia: Secondary | ICD-10-CM | POA: Diagnosis not present

## 2022-05-03 DIAGNOSIS — I5043 Acute on chronic combined systolic (congestive) and diastolic (congestive) heart failure: Secondary | ICD-10-CM | POA: Diagnosis not present

## 2022-05-03 DIAGNOSIS — G9589 Other specified diseases of spinal cord: Secondary | ICD-10-CM

## 2022-05-03 LAB — URINE CULTURE: Culture: 10000 — AB

## 2022-05-03 LAB — BASIC METABOLIC PANEL
Anion gap: 7 (ref 5–15)
BUN: 10 mg/dL (ref 6–20)
CO2: 41 mmol/L — ABNORMAL HIGH (ref 22–32)
Calcium: 8.8 mg/dL — ABNORMAL LOW (ref 8.9–10.3)
Chloride: 89 mmol/L — ABNORMAL LOW (ref 98–111)
Creatinine, Ser: 0.95 mg/dL (ref 0.61–1.24)
GFR, Estimated: >60 mL/min (ref >60)
Glucose, Bld: 110 mg/dL — ABNORMAL HIGH (ref 70–99)
Potassium: 4.3 mmol/L (ref 3.5–5.1)
Sodium: 137 mmol/L (ref 135–145)

## 2022-05-03 LAB — CBC
HCT: 46.9 % (ref 39.0–52.0)
Hemoglobin: 14.1 g/dL (ref 13.0–17.0)
MCH: 28.6 pg (ref 26.0–34.0)
MCHC: 30.1 g/dL (ref 30.0–36.0)
MCV: 95.1 fL (ref 80.0–100.0)
Platelets: 131 10*3/uL — ABNORMAL LOW (ref 150–400)
RBC: 4.93 MIL/uL (ref 4.22–5.81)
RDW: 14.1 % (ref 11.5–15.5)
WBC: 14.2 10*3/uL — ABNORMAL HIGH (ref 4.0–10.5)
nRBC: 0 % (ref 0.0–0.2)

## 2022-05-03 LAB — CULTURE, BLOOD (ROUTINE X 2): Special Requests: ADEQUATE

## 2022-05-03 LAB — GLUCOSE, CAPILLARY
Glucose-Capillary: 117 mg/dL — ABNORMAL HIGH (ref 70–99)
Glucose-Capillary: 122 mg/dL — ABNORMAL HIGH (ref 70–99)
Glucose-Capillary: 115 mg/dL — ABNORMAL HIGH (ref 70–99)
Glucose-Capillary: 147 mg/dL — ABNORMAL HIGH (ref 70–99)

## 2022-05-03 LAB — BLOOD GAS, ARTERIAL
Acid-Base Excess: 16.4 mmol/L — ABNORMAL HIGH (ref 0.0–2.0)
Bicarbonate: 46.4 mmol/L — ABNORMAL HIGH (ref 20.0–28.0)
Drawn by: 35043
FIO2: 40 %
O2 Saturation: 98.9 %
Patient temperature: 37.3
pCO2 arterial: 85 mmHg (ref 32–48)
pH, Arterial: 7.35 (ref 7.35–7.45)
pO2, Arterial: 107 mmHg (ref 83–108)

## 2022-05-03 LAB — MAGNESIUM: Magnesium: 1.7 mg/dL (ref 1.7–2.4)

## 2022-05-03 MED ORDER — ACETAZOLAMIDE SODIUM 500 MG IJ SOLR
250.0000 mg | Freq: Every day | INTRAMUSCULAR | Status: AC
  Administered 2022-05-03 – 2022-05-05 (×3): 250 mg via INTRAVENOUS
  Filled 2022-05-03 (×3): qty 250

## 2022-05-03 MED ORDER — SPIRONOLACTONE 12.5 MG HALF TABLET
12.5000 mg | ORAL_TABLET | Freq: Every day | ORAL | Status: DC
  Administered 2022-05-04 – 2022-05-10 (×7): 12.5 mg via ORAL
  Filled 2022-05-03 (×7): qty 1
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
## 2022-05-03 NOTE — Progress Notes (Signed)
PROGRESS NOTE    Jordan Jensen  ZOX:[id number] DOB: 01-Sep-1970 DOA: 05/01/2022 PCP: Billie Lade, MD   Brief Narrative: Jordan Jensen is a 52 y.o. male with a history of HFrEF, morbid obesity, chronic lymphedema, hypertension, hyperlipidemia, OSA. Patient presented secondary to progressively worsening dyspnea and abnormal urine. Patient found to have likely acute heart failure. Cardiology consulted. Diuresis started. Hospitalization complicated by worsening respiratory failure with hypoxia and hypercapnia requiring initiation of BiPAP.   Assessment and Plan:  Acute on chronic combined systolic and diastolic heart failure Present on admission. Patient with known severe systolic heart failure and follows with heart failure clinic. Chest x-ray on admission with vascular congestion without evidence of pulmonary edema. BNP is around patient's prior numbers, complicated by severe obesity. Patient started on Lasix IV diuresis on admission. Repeat Transthoracic Echocardiogram significant for stable LVEF of 20-25%. -Continue Lasix IV -Daily BMP while on IV diuresis -Daily weights and strict in/out -Low-sodium diet discussed with patient. -Cardiology service/heart failure team to follow patient on arrival to [facility] for further decision regarding medication management and adjustments.  Acute respiratory with hypoxia and hypercapnia -Complicated by history of OSA, obesity hypoventilation syndrome and pulmonary hypertension. -Patient also with systolic heart failure. -Patient require almost 36 hours of a straight BiPAP usage in order to achieve some stability and compensation. -He is a full code and willing to proceed with intubation and tracheostomy if needed. -After discussing with PCCM decision has been made to transfer to stepdown/progressive bed to [facility] for further evaluation and management will continue the use of BiPAP as needed/nightly. -Cardiology service will  also continue assisting with GDMT of his systolic heart failure management.    Lymphedema -Noted. Complicated by underlying severe heart failure as well. -Unlikely diuresis will improve this much. Appears stable. -Venous dopplers ordered on admission demonstrating no lower extremity DVTs.  Hyperlipidemia -Continue Crestor -Heart healthy diet discussed with patient.  OSA -Patient is on CPAP as an outpatient; but has expressed no good compliance. -For now we will continue the use of BiPAP as needed and nightly -2-3 L nasal cannula supplementation with goal of oxygen saturation to 89-90%.  Functional quadriplegia -PT/OT consulted on admission.  -Will follow recommendations.  Morbid obesity -Contributing significantly to current presentation and affecting ability to manage. Estimated body mass index is 71.52 kg/m as calculated from the following:   Height as of this encounter: 5\' 11"  (1.803 m).   Weight as of this encounter: 232.6 kg. -Low-calorie diet and portion control discussed with patient.  Pressure injury -Buttocks. Present on admission. -No signs of superimposed infection appreciated -Continue preventive measures and constant repositioning.   DVT prophylaxis: Lovenox Code Status:   Code Status: Full Code Family Communication: None at bedside Disposition Plan: Discharge pending improvement in respiratory failure. Unlikely discharge for several days while completing diuresis and further evaluation by PCCM.Marland Kitchen   Consultants:  PCCM Cardiology  Procedures:  4/26: Transthoracic Echocardiogram  Antimicrobials: None    Subjective: Expressing feeling slightly short of breath when laying completely flat; following commands appropriately, reporting some improvement in his breathing and at the time of examination of BiPAP with good saturation appreciated on 2-3 L nasal cannula supplementation.  Objective: BP 124/81   Pulse 82   Temp 98.2 F (36.8 C) (Oral)   Resp (!) 33    Ht 5\' 11"  (1.803 m)   Wt (!) 232.6 kg   SpO2 94%   BMI 71.52 kg/m   Examination: General exam:  Alert, awake, oriented x 3; following commands appropriately and reporting some improvement in his breathing.  Good saturation appreciated in off BiPAP and using 2-3 L nasal cannula supplementation at time of evaluation. Respiratory system: Fine crackles at the bases, fair air movement; no using accessory muscles.  Very distant breath sounds due to body habitus. Cardiovascular system: Rate controlled, no rubs, no gallops, unable to assess JVD with body habitus. Gastrointestinal system: Abdomen is obese, nondistended, soft and nontender. No organomegaly or masses felt. Normal bowel sounds heard. Central nervous system: Alert and oriented. No focal neurological deficits. Extremities: No cyanosis or clubbing; 2-3+ edema appreciated bilaterally.  No tenderness on palpation.  There is component of chronic swelling from baseline lymphedema. Skin: No petechiae.  Pressure injury in his buttocks present at time of admission without signs of superimposed infection. Psychiatry: Judgement and insight appear normal. Mood & affect appropriate.   Data Reviewed: I have personally reviewed following labs and imaging studies  CBC Lab Results  Component Value Date   WBC 14.2 (H) 05/03/2022   RBC 4.93 05/03/2022   HGB 14.1 05/03/2022   HCT 46.9 05/03/2022   MCV 95.1 05/03/2022   MCH 28.6 05/03/2022   PLT 131 (L) 05/03/2022   MCHC 30.1 05/03/2022   RDW 14.1 05/03/2022   LYMPHSABS 1.9 05/01/2022   MONOABS 0.8 05/01/2022   EOSABS 0.2 05/01/2022   BASOSABS 0.1 05/01/2022     Last metabolic panel Lab Results  Component Value Date   NA 137 05/03/2022   K 4.3 05/03/2022   CL 89 (L) 05/03/2022   CO2 41 (H) 05/03/2022   BUN 10 05/03/2022   CREATININE 0.95 05/03/2022   GLUCOSE 110 (H) 05/03/2022   GFRNONAA >60 05/03/2022   GFRAA >60 07/09/2018   CALCIUM 8.8 (L) 05/03/2022   PROT 7.9 05/01/2022    ALBUMIN 3.6 05/01/2022   LABGLOB 3.4 12/10/2021   AGRATIO 1.2 12/10/2021   BILITOT 0.7 05/01/2022   ALKPHOS 51 05/01/2022   AST 20 05/01/2022   ALT 18 05/01/2022   ANIONGAP 7 05/03/2022    GFR: Estimated Creatinine Clearance: 177.8 mL/min (by C-G formula based on SCr of 0.95 mg/dL).  Recent Results (from the past 240 hour(s))  Urine Culture (for pregnant, neutropenic or urologic patients or patients with an indwelling urinary catheter)     Status: Abnormal   Collection Time: 05/01/22 11:26 AM   Specimen: Urine, Clean Catch  Result Value Ref Range Status   Specimen Description   Final    URINE, CLEAN CATCH Performed at [facility], [address] [place], [place] [address]    Special Requests   Final    NONE Performed at [facility], [address] [place], [place] [address]    Culture 10,000 COLONIES/mL KLEBSIELLA PNEUMONIAE (A)  Final   Report Status 05/03/2022 FINAL  Final   Organism ID, Bacteria KLEBSIELLA PNEUMONIAE (A)  Final      Susceptibility   Klebsiella pneumoniae - MIC*    AMPICILLIN >=32 RESISTANT Resistant     CEFAZOLIN <=4 SENSITIVE Sensitive     CEFEPIME <=0.12 SENSITIVE Sensitive     CEFTRIAXONE <=0.25 SENSITIVE Sensitive     CIPROFLOXACIN <=0.25 SENSITIVE Sensitive     GENTAMICIN <=1 SENSITIVE Sensitive     IMIPENEM <=0.25 SENSITIVE Sensitive     NITROFURANTOIN 64 INTERMEDIATE Intermediate     TRIMETH/SULFA <=20 SENSITIVE Sensitive     AMPICILLIN/SULBACTAM 4 SENSITIVE Sensitive     PIP/TAZO <=4 SENSITIVE Sensitive     *  10,000 COLONIES/mL KLEBSIELLA PNEUMONIAE  Blood Culture (routine x 2)     Status: Abnormal (Preliminary result)   Collection Time: 05/01/22 12:22 PM   Specimen: BLOOD  Result Value Ref Range Status   Specimen Description   Final    BLOOD RIGHT ANTECUBITAL Performed at [facility], [address], [place], [place] [address]    Special Requests   Final    BOTTLES DRAWN AEROBIC AND ANAEROBIC Blood Culture adequate volume Performed  at [facility], [address] [place], [place] [address]    Culture  Setup Time   Final    GRAM POSITIVE COCCI ANAEROBIC BOTTLE ONLY Gram Stain Report Called to,Read Back By and Verified With: E TINAJERO AT 1414 ON [id number] BY S DALTON CRITICAL RESULT CALLED TO, READ BACK BY AND VERIFIED WITH: PHARMD L. POOLE [id number] @ 1705 FH    Culture (A)  Final    STAPHYLOCOCCUS EPIDERMIDIS CULTURE REINCUBATED FOR BETTER GROWTH Performed at [facility], [address], [place], [place] [address]    Report Status PENDING  Incomplete  Blood Culture (routine x 2)     Status: None (Preliminary result)   Collection Time: 05/01/22 12:22 PM   Specimen: BLOOD  Result Value Ref Range Status   Specimen Description   Final    BLOOD RIGHT ANTECUBITAL Performed at [facility], [address] [place], [place] [address]    Special Requests   Final    BOTTLES DRAWN AEROBIC AND ANAEROBIC Blood Culture adequate volume Performed at [facility], [address], [place], [place] [address]    Culture  Setup Time   Final    GRAM POSITIVE COCCI IN CLUSTERS ANAEROBIC BOTTLE ONLY Gram Stain Report Called to,Read Back By and Verified With: TINAJERO,E. @ 1525 ON 05/02/2022 BY FRATTO,A.    Culture   Final    GRAM POSITIVE COCCI CULTURE REINCUBATED FOR BETTER GROWTH Performed at [facility], [address], [place], [place] [address]    Report Status PENDING  Incomplete  Blood Culture ID Panel (Reflexed)     Status: Abnormal   Collection Time: 05/01/22 12:22 PM  Result Value Ref Range Status   Enterococcus faecalis NOT DETECTED NOT DETECTED Final   Enterococcus Faecium NOT DETECTED NOT DETECTED Final   Listeria monocytogenes NOT DETECTED NOT DETECTED Final   Staphylococcus species DETECTED (A) NOT DETECTED Final    Comment: CRITICAL RESULT CALLED TO, READ BACK BY AND VERIFIED WITH: PHARMD L. POOLE [id number] @ 1705 FH    Staphylococcus aureus (BCID) NOT DETECTED NOT DETECTED Final   Staphylococcus  epidermidis NOT DETECTED NOT DETECTED Final   Staphylococcus lugdunensis NOT DETECTED NOT DETECTED Final   Streptococcus species NOT DETECTED NOT DETECTED Final   Streptococcus agalactiae NOT DETECTED NOT DETECTED Final   Streptococcus pneumoniae NOT DETECTED NOT DETECTED Final   Streptococcus pyogenes NOT DETECTED NOT DETECTED Final   A.calcoaceticus-baumannii NOT DETECTED NOT DETECTED Final   Bacteroides fragilis NOT DETECTED NOT DETECTED Final   Enterobacterales NOT DETECTED NOT DETECTED Final   Enterobacter cloacae complex NOT DETECTED NOT DETECTED Final   Escherichia coli NOT DETECTED NOT DETECTED Final   Klebsiella aerogenes NOT DETECTED NOT DETECTED Final   Klebsiella oxytoca NOT DETECTED NOT DETECTED Final   Klebsiella pneumoniae NOT DETECTED NOT DETECTED Final   Proteus species NOT DETECTED NOT DETECTED Final   Salmonella species NOT DETECTED NOT DETECTED Final   Serratia marcescens NOT DETECTED NOT DETECTED Final   Haemophilus influenzae NOT DETECTED NOT  DETECTED Final   Neisseria meningitidis NOT DETECTED NOT DETECTED Final   Pseudomonas aeruginosa NOT DETECTED NOT DETECTED Final   Stenotrophomonas maltophilia NOT DETECTED NOT DETECTED Final   Candida albicans NOT DETECTED NOT DETECTED Final   Candida auris NOT DETECTED NOT DETECTED Final   Candida glabrata NOT DETECTED NOT DETECTED Final   Candida krusei NOT DETECTED NOT DETECTED Final   Candida parapsilosis NOT DETECTED NOT DETECTED Final   Candida tropicalis NOT DETECTED NOT DETECTED Final   Cryptococcus neoformans/gattii NOT DETECTED NOT DETECTED Final    Comment: Performed at [facility], [address] [place], [place] [address]  MRSA Next Gen by PCR, Nasal     Status: None   Collection Time: 05/01/22  6:57 PM   Specimen: Nasal Mucosa; Nasal Swab  Result Value Ref Range Status   MRSA by PCR Next Gen NOT DETECTED NOT DETECTED Final    Comment: (NOTE) The GeneXpert MRSA Assay (FDA approved for NASAL specimens  only), is one component of a comprehensive MRSA colonization surveillance program. It is not intended to diagnose MRSA infection nor to guide or monitor treatment for MRSA infections. Test performance is not FDA approved in patients less than 74 years old. Performed at [facility], [address] [place], [place] [address]       Radiology Studies: ECHOCARDIOGRAM COMPLETE  Result Date: 05/02/2022    ECHOCARDIOGRAM REPORT   Patient Name:   Jordan Jensen Date of Exam: 05/02/2022 Medical Rec #:  [id number]     Height:       71.0 in Accession #:    [id number]    Weight:       511.9 lb Date of Birth:  June 14, 1970      BSA:          3.144 m Patient Age:    51 years      BP:           111/70 mmHg Patient Gender: M             HR:           65 bpm. [facility] Procedure: 2D Echo, Cardiac Doppler, Color Doppler and Intracardiac            Opacification Agent Indications:    CHF-Acute Systolic I50.21  History:        Patient has prior history of Echocardiogram examinations, most                 recent 09/01/2021. CHF, Signs/Symptoms:Edema; Risk Factors:Former                 Smoker, Hypertension and Dyslipidemia.  Sonographer:    Aron Baba Referring Phys: 24 [address] EL[place]  Sonographer Comments: Technically difficult study due to poor echo windows and patient is obese. Image acquisition challenging due to respiratory motion. IMPRESSIONS  1. Left ventricular ejection fraction, by estimation, is 20 to 25%. The left ventricle has severely decreased function. The left ventricle demonstrates global hypokinesis. Left ventricular diastolic parameters are consistent with Grade I diastolic dysfunction (impaired relaxation).  2. Right ventricular systolic function was not well visualized. The right ventricular size is mildly enlarged. Tricuspid regurgitation signal is inadequate for assessing PA pressure.  3. The mitral valve was not well visualized. No evidence of mitral valve regurgitation. No evidence  of mitral stenosis.  4. The aortic valve was not well visualized. Aortic valve regurgitation is not visualized. No aortic stenosis is present.  5. Aortic dilatation noted. There is borderline dilatation of the aortic root, measuring 38 mm. There is borderline dilatation of the ascending aorta, measuring 38 mm. Comparison(s): No significant change from prior study. FINDINGS  Left Ventricle: Left ventricular ejection fraction, by estimation, is 20 to 25%. The left ventricle has severely decreased function. The left ventricle demonstrates global hypokinesis. Definity contrast agent was given IV to delineate the left ventricular endocardial borders. The left ventricular internal cavity size was normal in size. There is no left ventricular hypertrophy. Left ventricular diastolic parameters are consistent with Grade I diastolic dysfunction (impaired relaxation). Right Ventricle: The right ventricular size is mildly enlarged. Right vetricular wall thickness was not well visualized. Right ventricular systolic function was not well visualized. Tricuspid regurgitation signal is inadequate for assessing PA pressure. Left Atrium: Left atrial size was normal in size. Right Atrium: Right atrial size was normal in size. Pericardium: There is no evidence of pericardial effusion. Mitral Valve: The mitral valve was not well visualized. No evidence of mitral valve regurgitation. No evidence of mitral valve stenosis. Tricuspid Valve: The tricuspid valve is not well visualized. Tricuspid valve regurgitation is not demonstrated. No evidence of tricuspid stenosis. Aortic Valve: The aortic valve was not well visualized. Aortic valve regurgitation is not visualized. No aortic stenosis is present. Pulmonic Valve: The pulmonic valve was not well visualized. Pulmonic valve regurgitation is not visualized. No evidence of pulmonic stenosis. Aorta: Aortic dilatation noted. There is borderline dilatation of the aortic root, measuring 38 mm. There  is borderline dilatation of the ascending aorta, measuring 38 mm. Venous: IVC assessment for right atrial pressure unable to be performed due to mechanical ventilation. IAS/Shunts: The interatrial septum was not well visualized.  LEFT VENTRICLE PLAX 2D LVOT diam:     1.80 cm      Diastology LV SV:         22           LV e' medial:    4.82 cm/s LV SV Index:   7            LV E/e' medial:  15.7 LVOT Area:     2.54 cm     LV e' lateral:   4.82 cm/s                             LV E/e' lateral: 15.7  LV Volumes (MOD) LV vol d, MOD A2C: 203.0 ml LV vol d, MOD A4C: 241.0 ml LV vol s, MOD A2C: 160.0 ml LV vol s, MOD A4C: 195.0 ml LV SV MOD A2C:     43.0 ml LV SV MOD A4C:     241.0 ml LV SV MOD BP:      58.2 ml RIGHT VENTRICLE TAPSE (M-mode): 3.3 cm LEFT ATRIUM             Index        RIGHT ATRIUM           Index LA diam:        4.70 cm 1.50 cm/m   RA Area:     23.40 cm LA Vol (A2C):   83.6 ml 26.59 ml/m  RA Volume:   72.50 ml  23.06 ml/m LA Vol (A4C):   89.2 ml 28.37 ml/m LA Biplane Vol: 91.9 ml 29.23 ml/m  AORTIC VALVE LVOT Vmax:   49.20 cm/s LVOT Vmean:  37.750 cm/s LVOT VTI:    0.088 m  AORTA Ao  Root diam: 3.80 cm Ao Asc diam:  3.80 cm MITRAL VALVE MV Area (PHT): 4.21 cm    SHUNTS MV Decel Time: 180 msec    Systemic VTI:  0.09 m MV E velocity: 75.80 cm/s  Systemic Diam: 1.80 cm MV A velocity: 85.00 cm/s MV E/A ratio:  0.89 Vishnu Priya Mallipeddi Electronically signed by Winfield Rast Mallipeddi Signature Date/Time: 05/02/2022/10:09:18 AM    Final    US Venous Img Lower Bilateral  Result Date: 05/01/2022 CLINICAL DATA:  Edema and shortness of breath. Prior history of DVT and varicose veins. EXAM: Bilateral LOWER EXTREMITY VENOUS DOPPLER ULTRASOUND TECHNIQUE: Gray-scale sonography with compression, as well as color and duplex ultrasound, were performed to evaluate the deep venous system(s) from the level of the common femoral vein through the popliteal and proximal calf veins. COMPARISON:  09/02/2021 FINDINGS:  VENOUS Normal compressibility of the bilateral common femoral, superficial femoral, and popliteal veins, as well as the visualized calf veins. Visualized portions of profunda femoral vein and great saphenous vein unremarkable. No filling defects to suggest DVT on grayscale or color Doppler imaging. Doppler waveforms show normal direction of venous flow, normal respiratory plasticity and response to augmentation. Visualization of the calf veins is limited due to patient's body habitus. OTHER None. Limitations: Body habitus IMPRESSION: No evidence of acute deep venous thrombosis in the visualized lower extremity veins. Electronically Signed   By: Burman Nieves M.D.   On: 05/01/2022 16:48      LOS: 2 days    Jacquelin Hawking, MD [facility] 05/03/2022   If 7PM-7AM, please contact night-coverage [email or web address]

## 2022-05-03 NOTE — Progress Notes (Signed)
Took patient off Bipap after ABG was collected and placed back on [place] at patient's request.

## 2022-05-04 DIAGNOSIS — J9601 Acute respiratory failure with hypoxia: Secondary | ICD-10-CM | POA: Diagnosis not present

## 2022-05-04 LAB — BASIC METABOLIC PANEL
Anion gap: 8 (ref 5–15)
BUN: <5 mg/dL — ABNORMAL LOW (ref 6–20)
CO2: 37 mmol/L — ABNORMAL HIGH (ref 22–32)
Calcium: 8.6 mg/dL — ABNORMAL LOW (ref 8.9–10.3)
Chloride: 89 mmol/L — ABNORMAL LOW (ref 98–111)
Creatinine, Ser: 0.82 mg/dL (ref 0.61–1.24)
GFR, Estimated: >60 mL/min (ref >60)
Glucose, Bld: 126 mg/dL — ABNORMAL HIGH (ref 70–99)
Potassium: 3.2 mmol/L — ABNORMAL LOW (ref 3.5–5.1)
Sodium: 134 mmol/L — ABNORMAL LOW (ref 135–145)

## 2022-05-04 LAB — CULTURE, BLOOD (ROUTINE X 2): Special Requests: ADEQUATE

## 2022-05-04 LAB — GLUCOSE, CAPILLARY
Glucose-Capillary: 128 mg/dL — ABNORMAL HIGH (ref 70–99)
Glucose-Capillary: 121 mg/dL — ABNORMAL HIGH (ref 70–99)
Glucose-Capillary: 117 mg/dL — ABNORMAL HIGH (ref 70–99)
Glucose-Capillary: 123 mg/dL — ABNORMAL HIGH (ref 70–99)

## 2022-05-04 MED ORDER — MAGNESIUM SULFATE 2 GM/50ML IV SOLN
2.0000 g | Freq: Once | INTRAVENOUS | Status: AC
  Administered 2022-05-04: 2 g via INTRAVENOUS
  Filled 2022-05-04: qty 50

## 2022-05-04 MED ORDER — POTASSIUM CHLORIDE CRYS ER 20 MEQ PO TBCR
40.0000 meq | EXTENDED_RELEASE_TABLET | Freq: Two times a day (BID) | ORAL | Status: AC
  Administered 2022-05-04 (×2): 40 meq via ORAL
  Filled 2022-05-04 (×2): qty 2

## 2022-05-04 NOTE — Progress Notes (Signed)
RT was called to bedside by RN. Per RN, MD requesting pt to be placed back on BiPAP at this time. RT placed pt on BiPAP. Pt tolerating well with SVS.

## 2022-05-04 NOTE — Progress Notes (Signed)
PROGRESS NOTE    Jordan Jensen  ZOX:[id number] DOB: 1970/07/13 DOA: 05/01/2022  PCP: Billie Lade, MD   Brief Narrative:  This 52 yrs old male with PMH significant of HFrEF, morbid obesity, Chronic lymphedema, hypertension, hyperlipidemia, OSA presented secondary to progressively worsening dyspnea and abnormal urine. Patient found to have acute systolic heart failure. Cardiology consulted. Diuresis started. Hospitalization complicated by worsening respiratory failure with hypoxia and hypercapnia requiring initiation of BiPAP.  Patient was transferred to [facility] from [facility], Pulmonology consulted and started on BiPAP.  Assessment & Plan:   Principal Problem:   Acute respiratory failure with hypoxia (HCC) Active Problems:   Post traumatic myelopathy (HCC)   Bilateral leg edema   Disability of walking   Acute on chronic combined systolic and diastolic CHF (congestive heart failure) (HCC)   Wheelchair dependence   Prediabetes   Acute on chronic combined systolic and diastolic heart failure: Patient with known severe systolic heart failure, follows in the heart failure clinic. Chest x-ray without evidence of pulmonary edema.  BNP is around patient's baseline. Continue IV diuresis with Lasix 60 mg every 12 hours. Repeat echo shows LVEF 20 to 25%. Monitor daily weight, intake output charting.  Low-salt diet. Cardiology consulted.  Patient is 7 L negative balance.  Patient reports significant decrease in swelling. Continue IV diuresis.  Appreciate advanced heart failure team management.   Acute hypoxic and hypercapnic respiratory failure: Complicated by history of OSA, obesity hypoventilation syndrome and pulmonary hypertension. Patient was evaluated by pulmonologist recommended continue BiPAP. He is a full code and willing to proceed with intubation and tracheostomy if needed. If he remains lethargic will need to repeat ABG.  Continue acetazolamide for 3 days. Minimize sedating  medications.  Continue Dulera.   Lymphedema: Complicated by underlying severe heart failure as well. Unlikely diuresis will improve this much. Appears stable. Venous duplex no evidence of DVT.   Hyperlipidemia Continue Crestor.  OSA: Patient is on CPAP as an outpatient; but has expressed no good compliance. Continue BiPAP as needed and at night.  Functional quadriplegia -PT/OT consulted on admission.  -Will follow recommendations.   Morbid obesity: -Contributing significantly to current presentation and affecting ability to manage. Estimated body mass index is 71.52 kg/m as calculated from the following:   Height as of this encounter: 5\' 11"  (1.803 m).   Weight as of this encounter: 232.6 kg. -Low-calorie diet and portion control discussed with patient.   Pressure injury: -Buttocks. Present on admission. -No signs of superimposed infection appreciated -Continue preventive measures and constant repositioning.  Staph epidermidis bacteremia: 2/2 cultures positive.  Continue vancomycin Repeat blood cultures  DVT prophylaxis: Lovenox Code Status: Full code Family Communication: Wife at bedside Disposition Plan:  Status is: Inpatient Remains inpatient appropriate because: Admitted for acute on chronic hypoxic and hypercapnic respiratory failure secondary to COPD, OSA, acute systolic heart failure.   Consultants:  Cardiology Pulmonology  Procedures: BiPAP Antimicrobials: Vancomycin.  Subjective: Patient was seen and examined at bedside.  Overnight events noted. Patient reports feeling better, still having some shortness of breath.  Patient placed on BiPAP.  Objective: Vitals:   05/04/22 0337 05/04/22 0739 05/04/22 0812 05/04/22 1151  BP: 118/61 93/65  94/68  Pulse: 79 76 91 83  Resp: (!) 22 (!) 22 19 (!) 28  Temp: 98.2 F (36.8 C) 97.9 F (36.6 C)  98.4 F (36.9 C)  TempSrc: Oral Oral  Oral  SpO2: 94% 96% 95% 92%  Weight:      Height:  Intake/Output Summary (Last 24 hours) at 05/04/2022 1201 Last data filed at 05/04/2022 0741 Gross per 24 hour  Intake 260.73 ml  Output 2800 ml  Net -2539.27 ml   Filed Weights   05/02/22 0500 05/03/22 0440 05/03/22 1653  Weight: (!) 232.2 kg (!) 232.6 kg (!) 191.4 kg    Examination:  General exam: Appears comfortable, deconditioned, not in any acute distress. Respiratory system: Decreased breath sounds , respiratory effort normal.  RR 13 Cardiovascular system: S1 & S2 heard, regular rate and rhythm, no murmur. Gastrointestinal system: Abdomen is soft, nontender, nondistended, BS+ Central nervous system: Alert and oriented x3 . No focal neurological deficits. Extremities: Symmetric 5 x 5 power. Skin: No rashes, lesions or ulcers Psychiatry: Judgement and insight appear normal. Mood & affect appropriate.     Data Reviewed: I have personally reviewed following labs and imaging studies  CBC: Recent Labs  Lab 05/01/22 1222 05/02/22 0509 05/03/22 0630  WBC 12.7* 14.5* 14.2*  NEUTROABS 9.7*  --   --   HGB 14.4 14.4 14.1  HCT 47.0 48.3 46.9  MCV 93.8 97.4 95.1  PLT 154 135* 131*   Basic Metabolic Panel: Recent Labs  Lab 05/01/22 1222 05/02/22 0509 05/03/22 0630 05/03/22 1741 05/04/22 0157  NA 138 140 137  --  134*  K 4.3 4.9 4.3  --  3.2*  CL 100 97* 89*  --  89*  CO2 31 36* 41*  --  37*  GLUCOSE 111* 103* 110*  --  126*  BUN 9 8 10   --  <5*  CREATININE 0.70 0.91 0.95  --  0.82  CALCIUM 8.8* 8.9 8.8*  --  8.6*  MG  --   --   --  1.7  --    GFR: Estimated Creatinine Clearance: 181.4 mL/min (by C-G formula based on SCr of 0.82 mg/dL). Liver Function Tests: Recent Labs  Lab 05/01/22 1222  AST 20  ALT 18  ALKPHOS 51  BILITOT 0.7  PROT 7.9  ALBUMIN 3.6   No results for input(s): "LIPASE", "AMYLASE" in the last 168 hours. No results for input(s): "AMMONIA" in the last 168 hours. Coagulation Profile: Recent Labs  Lab 05/01/22 1222  INR 1.2    Cardiac Enzymes: No results for input(s): "CKTOTAL", "CKMB", "CKMBINDEX", "TROPONINI" in the last 168 hours. BNP (last 3 results) No results for input(s): "PROBNP" in the last 8760 hours. HbA1C: No results for input(s): "HGBA1C" in the last 72 hours. CBG: Recent Labs  Lab 05/03/22 1130 05/03/22 1700 05/03/22 2125 05/04/22 0747 05/04/22 1153  GLUCAP 122* 115* 147* 128* 121*   Lipid Profile: No results for input(s): "CHOL", "HDL", "LDLCALC", "TRIG", "CHOLHDL", "LDLDIRECT" in the last 72 hours. Thyroid Function Tests: No results for input(s): "TSH", "T4TOTAL", "FREET4", "T3FREE", "THYROIDAB" in the last 72 hours. Anemia Panel: No results for input(s): "VITAMINB12", "FOLATE", "FERRITIN", "TIBC", "IRON", "RETICCTPCT" in the last 72 hours. Sepsis Labs: Recent Labs  Lab 05/01/22 1222 05/01/22 1402  LATICACIDVEN 0.7 0.6    Recent Results (from the past 240 hour(s))  Urine Culture (for pregnant, neutropenic or urologic patients or patients with an indwelling urinary catheter)     Status: Abnormal   Collection Time: 05/01/22 11:26 AM   Specimen: Urine, Clean Catch  Result Value Ref Range Status   Specimen Description   Final    URINE, CLEAN CATCH Performed at [facility], [address] [place], [place] [address]    Special Requests   Final    NONE Performed at  [facility], [address] [place], [place] [address]    Culture 10,000 COLONIES/mL KLEBSIELLA PNEUMONIAE (A)  Final   Report Status 05/03/2022 FINAL  Final   Organism ID, Bacteria KLEBSIELLA PNEUMONIAE (A)  Final      Susceptibility   Klebsiella pneumoniae - MIC*    AMPICILLIN >=32 RESISTANT Resistant     CEFAZOLIN <=4 SENSITIVE Sensitive     CEFEPIME <=0.12 SENSITIVE Sensitive     CEFTRIAXONE <=0.25 SENSITIVE Sensitive     CIPROFLOXACIN <=0.25 SENSITIVE Sensitive     GENTAMICIN <=1 SENSITIVE Sensitive     IMIPENEM <=0.25 SENSITIVE Sensitive     NITROFURANTOIN 64 INTERMEDIATE Intermediate     TRIMETH/SULFA  <=20 SENSITIVE Sensitive     AMPICILLIN/SULBACTAM 4 SENSITIVE Sensitive     PIP/TAZO <=4 SENSITIVE Sensitive     * 10,000 COLONIES/mL KLEBSIELLA PNEUMONIAE  Blood Culture (routine x 2)     Status: Abnormal   Collection Time: 05/01/22 12:22 PM   Specimen: BLOOD  Result Value Ref Range Status   Specimen Description   Final    BLOOD RIGHT ANTECUBITAL Performed at [facility], [address], [place], [place] [address]    Special Requests   Final    BOTTLES DRAWN AEROBIC AND ANAEROBIC Blood Culture adequate volume Performed at [facility], [address], [place], [place] [address]    Culture  Setup Time   Final    GRAM POSITIVE COCCI ANAEROBIC BOTTLE ONLY Gram Stain Report Called to,Read Back By and Verified With: E TINAJERO AT 1414 ON [id number] BY S DALTON CRITICAL RESULT CALLED TO, READ BACK BY AND VERIFIED WITH: PHARMD L. POOLE [id number] @ 1705 FH    Culture (A)  Final    STAPHYLOCOCCUS EPIDERMIDIS THE SIGNIFICANCE OF ISOLATING THIS ORGANISM FROM A SINGLE SET OF BLOOD CULTURES WHEN MULTIPLE SETS ARE DRAWN IS UNCERTAIN. PLEASE NOTIFY THE MICROBIOLOGY DEPARTMENT WITHIN ONE WEEK IF SPECIATION AND SENSITIVITIES ARE REQUIRED. Performed at [facility], [address] [place], [place] [address]    Report Status 05/04/2022 FINAL  Final  Blood Culture (routine x 2)     Status: None (Preliminary result)   Collection Time: 05/01/22 12:22 PM   Specimen: BLOOD  Result Value Ref Range Status   Specimen Description   Final    BLOOD RIGHT ANTECUBITAL Performed at [facility], [address], [place], [place] [address]    Special Requests   Final    BOTTLES DRAWN AEROBIC AND ANAEROBIC Blood Culture adequate volume Performed at [facility], [address], [place], [place] [address]    Culture  Setup Time   Final    GRAM POSITIVE COCCI IN CLUSTERS ANAEROBIC BOTTLE ONLY Gram Stain Report Called to,Read Back By and Verified With: TINAJERO,E. @ 1525 ON 05/02/2022 BY FRATTO,A.    Culture    Final    GRAM POSITIVE COCCI IDENTIFICATION TO FOLLOW Performed at [facility], [address] [place], [place] [address]    Report Status PENDING  Incomplete  Blood Culture ID Panel (Reflexed)     Status: Abnormal   Collection Time: 05/01/22 12:22 PM  Result Value Ref Range Status   Enterococcus faecalis NOT DETECTED NOT DETECTED Final   Enterococcus Faecium NOT DETECTED NOT DETECTED Final   Listeria monocytogenes NOT DETECTED NOT DETECTED Final   Staphylococcus species DETECTED (A) NOT DETECTED Final    Comment: CRITICAL RESULT CALLED TO, READ BACK BY AND VERIFIED WITH: PHARMD L. POOLE [id number] @ 1705 FH  Staphylococcus aureus (BCID) NOT DETECTED NOT DETECTED Final   Staphylococcus epidermidis NOT DETECTED NOT DETECTED Final   Staphylococcus lugdunensis NOT DETECTED NOT DETECTED Final   Streptococcus species NOT DETECTED NOT DETECTED Final   Streptococcus agalactiae NOT DETECTED NOT DETECTED Final   Streptococcus pneumoniae NOT DETECTED NOT DETECTED Final   Streptococcus pyogenes NOT DETECTED NOT DETECTED Final   A.calcoaceticus-baumannii NOT DETECTED NOT DETECTED Final   Bacteroides fragilis NOT DETECTED NOT DETECTED Final   Enterobacterales NOT DETECTED NOT DETECTED Final   Enterobacter cloacae complex NOT DETECTED NOT DETECTED Final   Escherichia coli NOT DETECTED NOT DETECTED Final   Klebsiella aerogenes NOT DETECTED NOT DETECTED Final   Klebsiella oxytoca NOT DETECTED NOT DETECTED Final   Klebsiella pneumoniae NOT DETECTED NOT DETECTED Final   Proteus species NOT DETECTED NOT DETECTED Final   Salmonella species NOT DETECTED NOT DETECTED Final   Serratia marcescens NOT DETECTED NOT DETECTED Final   Haemophilus influenzae NOT DETECTED NOT DETECTED Final   Neisseria meningitidis NOT DETECTED NOT DETECTED Final   Pseudomonas aeruginosa NOT DETECTED NOT DETECTED Final   Stenotrophomonas maltophilia NOT DETECTED NOT DETECTED Final   Candida albicans NOT DETECTED NOT  DETECTED Final   Candida auris NOT DETECTED NOT DETECTED Final   Candida glabrata NOT DETECTED NOT DETECTED Final   Candida krusei NOT DETECTED NOT DETECTED Final   Candida parapsilosis NOT DETECTED NOT DETECTED Final   Candida tropicalis NOT DETECTED NOT DETECTED Final   Cryptococcus neoformans/gattii NOT DETECTED NOT DETECTED Final    Comment: Performed at [facility], [address], [place], [place] [address]  MRSA Next Gen by PCR, Nasal     Status: None   Collection Time: 05/01/22  6:57 PM   Specimen: Nasal Mucosa; Nasal Swab  Result Value Ref Range Status   MRSA by PCR Next Gen NOT DETECTED NOT DETECTED Final    Comment: (NOTE) The GeneXpert MRSA Assay (FDA approved for NASAL specimens only), is one component of a comprehensive MRSA colonization surveillance program. It is not intended to diagnose MRSA infection nor to guide or monitor treatment for MRSA infections. Test performance is not FDA approved in patients less than 44 years old. Performed at [facility], [address], [place], [place] [address]     Radiology Studies: No results found.  Scheduled Meds:  acetaZOLAMIDE  250 mg Intravenous Daily   Chlorhexidine Gluconate Cloth  6 each Topical Daily   enoxaparin (LOVENOX) injection  100 mg Subcutaneous Q24H   fluticasone  2 spray Each Nare Daily   furosemide  60 mg Intravenous Q12H   insulin aspart  0-15 Units Subcutaneous TID WC   insulin aspart  0-5 Units Subcutaneous QHS   metoprolol succinate  25 mg Oral Daily   mometasone-formoterol  2 puff Inhalation BID   montelukast  10 mg Oral QHS   mouth rinse  15 mL Mouth Rinse 4 times per day   potassium chloride  40 mEq Oral BID   rosuvastatin  20 mg Oral Daily   spironolactone  12.5 mg Oral Daily   Continuous Infusions:   LOS: 3 days    Time spent: 50 mins    Willeen Niece, MD [facility]   If 7PM-7AM, please contact night-coverage

## 2022-05-04 NOTE — Progress Notes (Signed)
Progress Note  Patient Name: Jordan Jensen Date of Encounter: 05/04/2022  Primary Cardiologist:   Donato Schultz, MD   Subjective   He thinks that his breathing is OK.  No pain.   Inpatient Medications    Scheduled Meds:  acetaZOLAMIDE  250 mg Intravenous Daily   Chlorhexidine Gluconate Cloth  6 each Topical Daily   enoxaparin (LOVENOX) injection  100 mg Subcutaneous Q24H   fluticasone  2 spray Each Nare Daily   furosemide  60 mg Intravenous Q12H   insulin aspart  0-15 Units Subcutaneous TID WC   insulin aspart  0-5 Units Subcutaneous QHS   metoprolol succinate  25 mg Oral Daily   mometasone-formoterol  2 puff Inhalation BID   montelukast  10 mg Oral QHS   mouth rinse  15 mL Mouth Rinse 4 times per day   potassium chloride  40 mEq Oral BID   rosuvastatin  20 mg Oral Daily   spironolactone  12.5 mg Oral Daily   Continuous Infusions:  PRN Meds: acetaminophen, albuterol, mouth rinse   Vital Signs    Vitals:   05/03/22 2325 05/04/22 0337 05/04/22 0739 05/04/22 0812  BP:  118/61 93/65   Pulse: 77 79 76 91  Resp: 20 (!) 22 (!) 22 19  Temp:  98.2 F (36.8 C) 97.9 F (36.6 C)   TempSrc:  Oral Oral   SpO2:  94% 96% 95%  Weight:      Height:        Intake/Output Summary (Last 24 hours) at 05/04/2022 1039 Last data filed at 05/04/2022 0741 Gross per 24 hour  Intake 260.73 ml  Output 2800 ml  Net -2539.27 ml   Filed Weights   05/02/22 0500 05/03/22 0440 05/03/22 1653  Weight: (!) 232.2 kg (!) 232.6 kg (!) 191.4 kg    Telemetry    NSR, PVCs - Personally Reviewed  ECG    NA - Personally Reviewed  Physical Exam   GEN: No acute distress.   Neck: No  JVD Cardiac: RRR, no murmurs, rubs, or gallops.  Respiratory: Clear  to auscultation bilaterally. GI: Soft, nontender, non-distended  MS:    Mild bilateral leg edema; No deformity. Neuro:  Nonfocal  Psych: Normal affect   Labs    Chemistry Recent Labs  Lab 05/01/22 1222 05/02/22 0509 05/03/22 0630  05/04/22 0157  NA 138 140 137 134*  K 4.3 4.9 4.3 3.2*  CL 100 97* 89* 89*  CO2 31 36* 41* 37*  GLUCOSE 111* 103* 110* 126*  BUN 9 8 10  <5*  CREATININE 0.70 0.91 0.95 0.82  CALCIUM 8.8* 8.9 8.8* 8.6*  PROT 7.9  --   --   --   ALBUMIN 3.6  --   --   --   AST 20  --   --   --   ALT 18  --   --   --   ALKPHOS 51  --   --   --   BILITOT 0.7  --   --   --   GFRNONAA >60 >60 >60 >60  ANIONGAP 7 7 7 8      Hematology Recent Labs  Lab 05/01/22 1222 05/02/22 0509 05/03/22 0630  WBC 12.7* 14.5* 14.2*  RBC 5.01 4.96 4.93  HGB 14.4 14.4 14.1  HCT 47.0 48.3 46.9  MCV 93.8 97.4 95.1  MCH 28.7 29.0 28.6  MCHC 30.6 29.8* 30.1  RDW 14.7 14.6 14.1  PLT 154 135* 131*  Cardiac EnzymesNo results for input(s): "TROPONINI" in the last 168 hours. No results for input(s): "TROPIPOC" in the last 168 hours.   BNP Recent Labs  Lab 05/01/22 1222  BNP 137.0*     DDimer No results for input(s): "DDIMER" in the last 168 hours.   Radiology    No results found.  Cardiac Studies   05/02/22  ECHO  1. Left ventricular ejection fraction, by estimation, is 20 to 25%. The  left ventricle has severely decreased function. The left ventricle  demonstrates global hypokinesis. Left ventricular diastolic parameters are  consistent with Grade I diastolic  dysfunction (impaired relaxation).   2. Right ventricular systolic function was not well visualized. The right  ventricular size is mildly enlarged. Tricuspid regurgitation signal is  inadequate for assessing PA pressure.   3. The mitral valve was not well visualized. No evidence of mitral valve  regurgitation. No evidence of mitral stenosis.   4. The aortic valve was not well visualized. Aortic valve regurgitation  is not visualized. No aortic stenosis is present.   5. Aortic dilatation noted. There is borderline dilatation of the aortic  root, measuring 38 mm. There is borderline dilatation of the ascending  aorta, measuring 38 mm.     Patient Profile     52 y.o. male with a hx of HFrEF (likely NICM) with drift down of EF from 45% in 2015 to 30-40% in 2022, chronic lymph edema, spinal cord injury, obesity, HTN, HLD, and GERD who was being seen 05/03/2022 for the evaluation of acute CHF at the request of  Dr. Gwenlyn Perking  Assessment & Plan    ACUTE ON CHRONIC SYSTOLIC AND DIASTOLIC HF:   Down 7 liters since admission.  Likely I/O incomplete.   Weights are all over the place but the patient reports significant decrease in swelling.  .  Tolerating current diuretic.  Continue current diuretic.  Unable to titrate meds with hypotension.   BACTEREMIA:  Not on Vanc currently.  Talked with primary team and they will repeat blood cultures.    HYPOKALEMIA:  Potassium being supplemented.    For questions or updates, please contact [facility] Please consult [email or web address] for contact info under Cardiology/STEMI.   Signed, Rollene Rotunda, MD  05/04/2022, 10:39 AM
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
## 2022-05-04 NOTE — Progress Notes (Deleted)
OT Cancellation Note  Patient Details Name: Jordan Jensen MRN: [id number] DOB: 1970-08-03   Cancelled Treatment:    Reason Eval/Treat Not Completed: Medical issues which prohibited therapy. Spoke with PT who reports pt desatting to 78% on 10L with minimal bed mobility/repositioning. Will hold for now, and return as schedule allows and pt medically ready.   Tyler Deis, OTR/L [facility] Acute Rehabilitation Office: [phone]   [name] 05/04/2022, 9:33 AM

## 2022-05-05 DIAGNOSIS — L8990 Pressure ulcer of unspecified site, unspecified stage: Secondary | ICD-10-CM | POA: Insufficient documentation

## 2022-05-05 DIAGNOSIS — E662 Morbid (severe) obesity with alveolar hypoventilation: Secondary | ICD-10-CM

## 2022-05-05 DIAGNOSIS — G4733 Obstructive sleep apnea (adult) (pediatric): Secondary | ICD-10-CM

## 2022-05-05 DIAGNOSIS — J9621 Acute and chronic respiratory failure with hypoxia: Secondary | ICD-10-CM | POA: Diagnosis present

## 2022-05-05 DIAGNOSIS — J9601 Acute respiratory failure with hypoxia: Secondary | ICD-10-CM | POA: Diagnosis not present

## 2022-05-05 LAB — BASIC METABOLIC PANEL
Anion gap: 10 (ref 5–15)
BUN: 11 mg/dL (ref 6–20)
CO2: 33 mmol/L — ABNORMAL HIGH (ref 22–32)
Calcium: 9.1 mg/dL (ref 8.9–10.3)
Chloride: 93 mmol/L — ABNORMAL LOW (ref 98–111)
Creatinine, Ser: 0.93 mg/dL (ref 0.61–1.24)
GFR, Estimated: >60 mL/min (ref >60)
Glucose, Bld: 112 mg/dL — ABNORMAL HIGH (ref 70–99)
Potassium: 3.8 mmol/L (ref 3.5–5.1)
Sodium: 136 mmol/L (ref 135–145)

## 2022-05-05 LAB — GLUCOSE, CAPILLARY
Glucose-Capillary: 125 mg/dL — ABNORMAL HIGH (ref 70–99)
Glucose-Capillary: 113 mg/dL — ABNORMAL HIGH (ref 70–99)
Glucose-Capillary: 129 mg/dL — ABNORMAL HIGH (ref 70–99)
Glucose-Capillary: 132 mg/dL — ABNORMAL HIGH (ref 70–99)

## 2022-05-05 LAB — CBC
HCT: 46.3 % (ref 39.0–52.0)
Hemoglobin: 14.5 g/dL (ref 13.0–17.0)
MCH: 28.5 pg (ref 26.0–34.0)
MCHC: 31.3 g/dL (ref 30.0–36.0)
MCV: 91 fL (ref 80.0–100.0)
Platelets: 142 10*3/uL — ABNORMAL LOW (ref 150–400)
RBC: 5.09 MIL/uL (ref 4.22–5.81)
RDW: 14 % (ref 11.5–15.5)
WBC: 12.7 10*3/uL — ABNORMAL HIGH (ref 4.0–10.5)
nRBC: 0 % (ref 0.0–0.2)

## 2022-05-05 LAB — CULTURE, BLOOD (ROUTINE X 2): Culture: NO GROWTH

## 2022-05-05 LAB — PHOSPHORUS: Phosphorus: 3.6 mg/dL (ref 2.5–4.6)

## 2022-05-05 LAB — MAGNESIUM: Magnesium: 2.1 mg/dL (ref 1.7–2.4)
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
## 2022-05-05 NOTE — Progress Notes (Signed)
BiPap placed at 11 pm last night by respiratory therapist and removed by this RN at 0530. Patient tolerated well during night.
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
## 2022-05-05 NOTE — Progress Notes (Signed)
Heart Failure Navigator Progress Note  Assessed for Heart & Vascular TOC clinic readiness.  Patient does not meet criteria due to Advanced Heart Failure Team patient.   Navigator will sign off at this time.    Akylah Hascall, BSN, RN Heart Failure Nurse Navigator Secure Chat Only   
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
## 2022-05-05 NOTE — Progress Notes (Signed)
Pt currently on 2L [place] with BiPAP at bedside on stby.

## 2022-05-05 NOTE — Care Management Important Message (Signed)
Important Message  Patient Details  Name: Jordan Jensen MRN: [id number] Date of Birth: 1970/03/29   Medicare Important Message Given:  Yes     [place], [place] 05/05/2022, 12:06 PM

## 2022-05-06 DIAGNOSIS — J810 Acute pulmonary edema: Secondary | ICD-10-CM

## 2022-05-06 DIAGNOSIS — J9601 Acute respiratory failure with hypoxia: Secondary | ICD-10-CM | POA: Diagnosis not present

## 2022-05-06 DIAGNOSIS — I5023 Acute on chronic systolic (congestive) heart failure: Secondary | ICD-10-CM

## 2022-05-06 LAB — BASIC METABOLIC PANEL
Anion gap: 8 (ref 5–15)
BUN: 16 mg/dL (ref 6–20)
CO2: 32 mmol/L (ref 22–32)
Calcium: 9 mg/dL (ref 8.9–10.3)
Chloride: 92 mmol/L — ABNORMAL LOW (ref 98–111)
Creatinine, Ser: 0.93 mg/dL (ref 0.61–1.24)
GFR, Estimated: >60 mL/min (ref >60)
Glucose, Bld: 135 mg/dL — ABNORMAL HIGH (ref 70–99)
Potassium: 3.4 mmol/L — ABNORMAL LOW (ref 3.5–5.1)
Sodium: 132 mmol/L — ABNORMAL LOW (ref 135–145)

## 2022-05-06 LAB — GLUCOSE, CAPILLARY
Glucose-Capillary: 129 mg/dL — ABNORMAL HIGH (ref 70–99)
Glucose-Capillary: 113 mg/dL — ABNORMAL HIGH (ref 70–99)
Glucose-Capillary: 111 mg/dL — ABNORMAL HIGH (ref 70–99)
Glucose-Capillary: 111 mg/dL — ABNORMAL HIGH (ref 70–99)
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
## 2022-05-06 NOTE — Progress Notes (Signed)
PROGRESS NOTE    Jordan Jensen  UJW:[id number] DOB: 1970-02-04 DOA: 05/01/2022  PCP: Billie Lade, MD   Brief Narrative:  This 52 yrs old male with PMH significant of HFrEF, morbid obesity, Chronic lymphedema, hypertension, hyperlipidemia, OSA presented secondary to progressively worsening dyspnea and abnormal urine. Patient found to have acute systolic heart failure. Cardiology consulted. Diuresis started. Hospitalization complicated by worsening respiratory failure with hypoxia and hypercapnia requiring initiation of BiPAP.  Patient was transferred to [facility] from [facility], Pulmonology consulted and started on BiPAP. Patient seems improved.  Assessment & Plan:   Principal Problem:   Acute respiratory failure with hypoxia (HCC) Active Problems:   Post traumatic myelopathy (HCC)   Bilateral leg edema   Disability of walking   Acute on chronic combined systolic and diastolic CHF (congestive heart failure) (HCC)   Wheelchair dependence   Prediabetes   Acute on chronic respiratory failure with hypoxia and hypercapnia (HCC)   Obesity hypoventilation syndrome (HCC)   Pressure injury of skin  Acute on chronic combined systolic and diastolic heart failure: Patient with known severe systolic heart failure, follows in the heart failure clinic. Chest x-ray without evidence of pulmonary edema.  BNP is around patient's baseline. Continue IV diuresis with Lasix 60 mg every 12 hours. Repeat echo shows LVEF 20 to 25%. Monitor daily weight, intake output charting.  Low-salt diet. Cardiology consulted.  Patient is 10 L negative balance.  Patient reports significant decrease in swelling. Continue IV diuresis.  Appreciate advanced heart failure team management. Continue metoprolol XL 25 mg daily, spironolactone 20.5 mg daily, unable to need additional agent given low blood pressure.   Acute hypoxic and hypercapnic respiratory failure: Complicated by history of OSA, obesity hypoventilation  syndrome and pulmonary hypertension. Patient was evaluated by pulmonologist recommended continue BiPAP. He is a full code and willing to proceed with intubation and tracheostomy if needed. If he remains lethargic will need to repeat ABG.  Completed  acetazolamide for 3 days. Minimize sedating medications.  Continue Dulera. He is weaned down to 2 L of supplemental oxygen.   Lymphedema: Complicated by underlying severe heart failure as well. Unlikely diuresis will improve this much. Appears stable. Venous duplex no evidence of DVT.   Hyperlipidemia Continue Crestor.  OSA: Patient is on CPAP as an outpatient; but has expressed no good compliance. Continue BiPAP as needed and at night. Needs BiPAP to be arranged before discharge.  Functional quadriplegia -PT/OT consulted on admission.  -Will follow recommendations.   Morbid obesity: -Contributing significantly to current presentation and affecting ability to manage. Estimated body mass index is 71.52 kg/m as calculated from the following:   Height as of this encounter: 5\' 11"  (1.803 m).   Weight as of this encounter: 232.6 kg. -Low-calorie diet and portion control discussed with patient.   Pressure injury: -Buttocks. Present on admission. -No signs of superimposed infection appreciated -Continue preventive measures and constant repositioning.  Staph epidermidis bacteremia: 2/2 cultures positive.Patient has received vancomycin x 2 days. Repeat cultures negative for 2 days, remains afebrile, slight leucocytosis. Discussed with pharmacy, continue to monitor and consider Antibiotics if repeat cultures +  DVT prophylaxis: Lovenox Code Status: Full code Family Communication: Wife at bedside Disposition Plan:  Status is: Inpatient Remains inpatient appropriate because: Admitted for acute on chronic hypoxic and hypercapnic respiratory failure secondary to COPD, OSA, acute systolic heart failure. Pulmonology and cardiology  consulted, Patient needs BiPAP to be arranged  before discharge.   Consultants:  Cardiology Pulmonology  Procedures:  BiPAP Antimicrobials: Vancomycin.  Subjective: Patient was seen and examined at bedside.  Overnight events noted. Patient reports feeling better.  He reports has lost a lot of weight. He is weaned down to 2 L.  Objective: Vitals:   05/06/22 0612 05/06/22 0741 05/06/22 0805 05/06/22 1153  BP: 109/70 108/81  100/63  Pulse:  87 84 79  Resp: (!) 35 (!) 39 17 (!) 22  Temp:  98.6 F (37 C)  98.6 F (37 C)  TempSrc:  Oral  Oral  SpO2: 95% 92% 93% 96%  Weight:      Height:        Intake/Output Summary (Last 24 hours) at 05/06/2022 1217 Last data filed at 05/06/2022 0746 Gross per 24 hour  Intake 480 ml  Output 2025 ml  Net -1545 ml   Filed Weights   05/03/22 1653 05/05/22 0506 05/06/22 0403  Weight: (!) 191.4 kg (!) 178.1 kg (!) 189 kg    Examination:  General exam: Appears comfortable, deconditioned, not in any acute distress. Respiratory system: Decreased breath sounds, respiratory effort normal, RR 15 Cardiovascular system: S1 & S2 heard, regular rate and rhythm, no murmur. Gastrointestinal system: Abdomen is soft, nontender, nondistended, BS+ Central nervous system: Alert and oriented x3 . No focal neurological deficits. Extremities: Chronic lymphedema of both lower extremities. Skin: No rashes, lesions or ulcers Psychiatry: Judgement and insight appear normal. Mood & affect appropriate.     Data Reviewed: I have personally reviewed following labs and imaging studies  CBC: Recent Labs  Lab 05/01/22 1222 05/02/22 0509 05/03/22 0630 05/05/22 0211  WBC 12.7* 14.5* 14.2* 12.7*  NEUTROABS 9.7*  --   --   --   HGB 14.4 14.4 14.1 14.5  HCT 47.0 48.3 46.9 46.3  MCV 93.8 97.4 95.1 91.0  PLT 154 135* 131* 142*   Basic Metabolic Panel: Recent Labs  Lab 05/02/22 0509 05/03/22 0630 05/03/22 1741 05/04/22 0157 05/05/22 0211 05/06/22 0231  NA  140 137  --  134* 136 132*  K 4.9 4.3  --  3.2* 3.8 3.4*  CL 97* 89*  --  89* 93* 92*  CO2 36* 41*  --  37* 33* 32  GLUCOSE 103* 110*  --  126* 112* 135*  BUN 8 10  --  <5* 11 16  CREATININE 0.91 0.95  --  0.82 0.93 0.93  CALCIUM 8.9 8.8*  --  8.6* 9.1 9.0  MG  --   --  1.7  --  2.1  --   PHOS  --   --   --   --  3.6  --    GFR: Estimated Creatinine Clearance: 158.8 mL/min (by C-G formula based on SCr of 0.93 mg/dL). Liver Function Tests: Recent Labs  Lab 05/01/22 1222  AST 20  ALT 18  ALKPHOS 51  BILITOT 0.7  PROT 7.9  ALBUMIN 3.6   No results for input(s): "LIPASE", "AMYLASE" in the last 168 hours. No results for input(s): "AMMONIA" in the last 168 hours. Coagulation Profile: Recent Labs  Lab 05/01/22 1222  INR 1.2   Cardiac Enzymes: No results for input(s): "CKTOTAL", "CKMB", "CKMBINDEX", "TROPONINI" in the last 168 hours. BNP (last 3 results) No results for input(s): "PROBNP" in the last 8760 hours. HbA1C: No results for input(s): "HGBA1C" in the last 72 hours. CBG: Recent Labs  Lab 05/05/22 1159 05/05/22 1643 05/05/22 2104 05/06/22 0745 05/06/22 1150  GLUCAP 113* 129* 132* 129* 113*   Lipid Profile: No results for input(s): "CHOL", "  HDL", "LDLCALC", "TRIG", "CHOLHDL", "LDLDIRECT" in the last 72 hours. Thyroid Function Tests: No results for input(s): "TSH", "T4TOTAL", "FREET4", "T3FREE", "THYROIDAB" in the last 72 hours. Anemia Panel: No results for input(s): "VITAMINB12", "FOLATE", "FERRITIN", "TIBC", "IRON", "RETICCTPCT" in the last 72 hours. Sepsis Labs: Recent Labs  Lab 05/01/22 1222 05/01/22 1402  LATICACIDVEN 0.7 0.6    Recent Results (from the past 240 hour(s))  Urine Culture (for pregnant, neutropenic or urologic patients or patients with an indwelling urinary catheter)     Status: Abnormal   Collection Time: 05/01/22 11:26 AM   Specimen: Urine, Clean Catch  Result Value Ref Range Status   Specimen Description   Final    URINE, CLEAN  CATCH Performed at [facility], [address], [place], [place] [address]    Special Requests   Final    NONE Performed at [facility], [address] [place], [place] [address]    Culture 10,000 COLONIES/mL KLEBSIELLA PNEUMONIAE (A)  Final   Report Status 05/03/2022 FINAL  Final   Organism ID, Bacteria KLEBSIELLA PNEUMONIAE (A)  Final      Susceptibility   Klebsiella pneumoniae - MIC*    AMPICILLIN >=32 RESISTANT Resistant     CEFAZOLIN <=4 SENSITIVE Sensitive     CEFEPIME <=0.12 SENSITIVE Sensitive     CEFTRIAXONE <=0.25 SENSITIVE Sensitive     CIPROFLOXACIN <=0.25 SENSITIVE Sensitive     GENTAMICIN <=1 SENSITIVE Sensitive     IMIPENEM <=0.25 SENSITIVE Sensitive     NITROFURANTOIN 64 INTERMEDIATE Intermediate     TRIMETH/SULFA <=20 SENSITIVE Sensitive     AMPICILLIN/SULBACTAM 4 SENSITIVE Sensitive     PIP/TAZO <=4 SENSITIVE Sensitive     * 10,000 COLONIES/mL KLEBSIELLA PNEUMONIAE  Blood Culture (routine x 2)     Status: Abnormal   Collection Time: 05/01/22 12:22 PM   Specimen: BLOOD  Result Value Ref Range Status   Specimen Description   Final    BLOOD RIGHT ANTECUBITAL Performed at [facility], [address] [place], [place] [address]    Special Requests   Final    BOTTLES DRAWN AEROBIC AND ANAEROBIC Blood Culture adequate volume Performed at [facility], [address], [place], [place] [address]    Culture  Setup Time   Final    GRAM POSITIVE COCCI ANAEROBIC BOTTLE ONLY Gram Stain Report Called to,Read Back By and Verified With: E TINAJERO AT 1414 ON [id number] BY S DALTON CRITICAL RESULT CALLED TO, READ BACK BY AND VERIFIED WITH: PHARMD L. POOLE [id number] @ 1705 FH    Culture (A)  Final    STAPHYLOCOCCUS EPIDERMIDIS THE SIGNIFICANCE OF ISOLATING THIS ORGANISM FROM A SINGLE SET OF BLOOD CULTURES WHEN MULTIPLE SETS ARE DRAWN IS UNCERTAIN. PLEASE NOTIFY THE MICROBIOLOGY DEPARTMENT WITHIN ONE WEEK IF SPECIATION AND SENSITIVITIES ARE REQUIRED. Performed at [facility], [address] [place], [place] [address]    Report Status 05/04/2022 FINAL  Final  Blood Culture (routine x 2)     Status: Abnormal   Collection Time: 05/01/22 12:22 PM   Specimen: BLOOD  Result Value Ref Range Status   Specimen Description   Final    BLOOD RIGHT ANTECUBITAL Performed at [facility], [address] [place], [place] [address]    Special Requests   Final    BOTTLES DRAWN AEROBIC AND ANAEROBIC Blood Culture adequate volume Performed at [facility], [address] [place], [place] [address]    Culture  Setup Time   Final    Romie Minus  POSITIVE COCCI IN CLUSTERS ANAEROBIC BOTTLE ONLY Gram Stain Report Called to,Read Back By and Verified With: TINAJERO,E. @ 1525 ON 05/02/2022 BY FRATTO,A.    Culture (A)  Final    STAPHYLOCOCCUS AURICULARIS THE SIGNIFICANCE OF ISOLATING THIS ORGANISM FROM A SINGLE SET OF BLOOD CULTURES WHEN MULTIPLE SETS ARE DRAWN IS UNCERTAIN. PLEASE NOTIFY THE MICROBIOLOGY DEPARTMENT WITHIN ONE WEEK IF SPECIATION AND SENSITIVITIES ARE REQUIRED. Performed at [facility], [address], [place], [place] [address]    Report Status 05/06/2022 FINAL  Final  Blood Culture ID Panel (Reflexed)     Status: Abnormal   Collection Time: 05/01/22 12:22 PM  Result Value Ref Range Status   Enterococcus faecalis NOT DETECTED NOT DETECTED Final   Enterococcus Faecium NOT DETECTED NOT DETECTED Final   Listeria monocytogenes NOT DETECTED NOT DETECTED Final   Staphylococcus species DETECTED (A) NOT DETECTED Final    Comment: CRITICAL RESULT CALLED TO, READ BACK BY AND VERIFIED WITH: PHARMD L. POOLE [id number] @ 1705 FH    Staphylococcus aureus (BCID) NOT DETECTED NOT DETECTED Final   Staphylococcus epidermidis NOT DETECTED NOT DETECTED Final   Staphylococcus lugdunensis NOT DETECTED NOT DETECTED Final   Streptococcus species NOT DETECTED NOT DETECTED Final   Streptococcus agalactiae NOT DETECTED NOT DETECTED Final   Streptococcus pneumoniae NOT DETECTED  NOT DETECTED Final   Streptococcus pyogenes NOT DETECTED NOT DETECTED Final   A.calcoaceticus-baumannii NOT DETECTED NOT DETECTED Final   Bacteroides fragilis NOT DETECTED NOT DETECTED Final   Enterobacterales NOT DETECTED NOT DETECTED Final   Enterobacter cloacae complex NOT DETECTED NOT DETECTED Final   Escherichia coli NOT DETECTED NOT DETECTED Final   Klebsiella aerogenes NOT DETECTED NOT DETECTED Final   Klebsiella oxytoca NOT DETECTED NOT DETECTED Final   Klebsiella pneumoniae NOT DETECTED NOT DETECTED Final   Proteus species NOT DETECTED NOT DETECTED Final   Salmonella species NOT DETECTED NOT DETECTED Final   Serratia marcescens NOT DETECTED NOT DETECTED Final   Haemophilus influenzae NOT DETECTED NOT DETECTED Final   Neisseria meningitidis NOT DETECTED NOT DETECTED Final   Pseudomonas aeruginosa NOT DETECTED NOT DETECTED Final   Stenotrophomonas maltophilia NOT DETECTED NOT DETECTED Final   Candida albicans NOT DETECTED NOT DETECTED Final   Candida auris NOT DETECTED NOT DETECTED Final   Candida glabrata NOT DETECTED NOT DETECTED Final   Candida krusei NOT DETECTED NOT DETECTED Final   Candida parapsilosis NOT DETECTED NOT DETECTED Final   Candida tropicalis NOT DETECTED NOT DETECTED Final   Cryptococcus neoformans/gattii NOT DETECTED NOT DETECTED Final    Comment: Performed at [facility], [address], [place], [place] [address]  MRSA Next Gen by PCR, Nasal     Status: None   Collection Time: 05/01/22  6:57 PM   Specimen: Nasal Mucosa; Nasal Swab  Result Value Ref Range Status   MRSA by PCR Next Gen NOT DETECTED NOT DETECTED Final    Comment: (NOTE) The GeneXpert MRSA Assay (FDA approved for NASAL specimens only), is one component of a comprehensive MRSA colonization surveillance program. It is not intended to diagnose MRSA infection nor to guide or monitor treatment for MRSA infections. Test performance is not FDA approved in patients less than 44  years old. Performed at [facility], [address] [place], [place] [address]   Culture, blood (Routine X 2) w Reflex to ID Panel     Status: None (Preliminary result)   Collection Time: 05/04/22 11:14 AM   Specimen: BLOOD LEFT HAND  Result Value Ref Range Status   Specimen Description BLOOD LEFT HAND  Final   Special Requests   Final    BOTTLES DRAWN AEROBIC AND ANAEROBIC Blood Culture adequate volume   Culture   Final    NO GROWTH 2 DAYS Performed at [facility], [address], [place], [place] [address]    Report Status PENDING  Incomplete  Culture, blood (Routine X 2) w Reflex to ID Panel     Status: None (Preliminary result)   Collection Time: 05/04/22 11:19 AM   Specimen: BLOOD RIGHT HAND  Result Value Ref Range Status   Specimen Description BLOOD RIGHT HAND  Final   Special Requests   Final    BOTTLES DRAWN AEROBIC AND ANAEROBIC Blood Culture adequate volume   Culture   Final    NO GROWTH 2 DAYS Performed at [facility], [address], [place], [place] [address]    Report Status PENDING  Incomplete    Radiology Studies: No results found.  Scheduled Meds:  enoxaparin (LOVENOX) injection  100 mg Subcutaneous Q24H   fluticasone  2 spray Each Nare Daily   furosemide  60 mg Intravenous Q12H   insulin aspart  0-15 Units Subcutaneous TID WC   insulin aspart  0-5 Units Subcutaneous QHS   metoprolol succinate  25 mg Oral Daily   mometasone-formoterol  2 puff Inhalation BID   montelukast  10 mg Oral QHS   mouth rinse  15 mL Mouth Rinse 4 times per day   rosuvastatin  20 mg Oral Daily   spironolactone  12.5 mg Oral Daily   Continuous Infusions:   LOS: 5 days    Time spent: 35 mins    Willeen Niece, MD [facility]   If 7PM-7AM, please contact night-coverage

## 2022-05-06 NOTE — Progress Notes (Signed)
Pt removed BiPap at 0400 and refused to replace. Placed pt on 2 L [place]
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
## 2022-05-07 DIAGNOSIS — R7303 Prediabetes: Secondary | ICD-10-CM

## 2022-05-07 DIAGNOSIS — E662 Morbid (severe) obesity with alveolar hypoventilation: Secondary | ICD-10-CM

## 2022-05-07 DIAGNOSIS — J810 Acute pulmonary edema: Secondary | ICD-10-CM

## 2022-05-07 DIAGNOSIS — Z993 Dependence on wheelchair: Secondary | ICD-10-CM

## 2022-05-07 DIAGNOSIS — J9601 Acute respiratory failure with hypoxia: Secondary | ICD-10-CM | POA: Diagnosis not present

## 2022-05-07 DIAGNOSIS — I5023 Acute on chronic systolic (congestive) heart failure: Secondary | ICD-10-CM

## 2022-05-07 DIAGNOSIS — J9621 Acute and chronic respiratory failure with hypoxia: Secondary | ICD-10-CM | POA: Diagnosis not present

## 2022-05-07 LAB — BLOOD GAS, VENOUS
Acid-Base Excess: 16.8 mmol/L — ABNORMAL HIGH (ref 0.0–2.0)
Bicarbonate: 47.5 mmol/L — ABNORMAL HIGH (ref 20.0–28.0)
O2 Saturation: 94.9 %
Patient temperature: 36.3
pCO2, Ven: 87 mmHg (ref 44–60)
pH, Ven: 7.34 (ref 7.25–7.43)
pO2, Ven: 65 mmHg — ABNORMAL HIGH (ref 32–45)

## 2022-05-07 LAB — CULTURE, BLOOD (ROUTINE X 2)

## 2022-05-07 LAB — BASIC METABOLIC PANEL
Anion gap: 12 (ref 5–15)
BUN: 15 mg/dL (ref 6–20)
CO2: 35 mmol/L — ABNORMAL HIGH (ref 22–32)
Calcium: 9.3 mg/dL (ref 8.9–10.3)
Chloride: 89 mmol/L — ABNORMAL LOW (ref 98–111)
Creatinine, Ser: 0.88 mg/dL (ref 0.61–1.24)
GFR, Estimated: >60 mL/min (ref >60)
Glucose, Bld: 103 mg/dL — ABNORMAL HIGH (ref 70–99)
Potassium: 3.4 mmol/L — ABNORMAL LOW (ref 3.5–5.1)
Sodium: 136 mmol/L (ref 135–145)

## 2022-05-07 LAB — GLUCOSE, CAPILLARY
Glucose-Capillary: 120 mg/dL — ABNORMAL HIGH (ref 70–99)
Glucose-Capillary: 147 mg/dL — ABNORMAL HIGH (ref 70–99)
Glucose-Capillary: 113 mg/dL — ABNORMAL HIGH (ref 70–99)

## 2022-05-07 MED ORDER — SODIUM CHLORIDE 0.9 % IV SOLN
2.0000 g | INTRAVENOUS | Status: DC
  Administered 2022-05-07 – 2022-05-10 (×4): 2 g via INTRAVENOUS
  Filled 2022-05-07 (×4): qty 20

## 2022-05-07 MED ORDER — POTASSIUM CHLORIDE CRYS ER 20 MEQ PO TBCR
40.0000 meq | EXTENDED_RELEASE_TABLET | Freq: Two times a day (BID) | ORAL | Status: DC
  Administered 2022-05-07 – 2022-05-10 (×7): 40 meq via ORAL
  Filled 2022-05-07 (×7): qty 2
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
## 2022-05-07 NOTE — Progress Notes (Signed)
Overnight pulse ox study started per order.  Patient placed on BiPAP during study due to patient being hypercapnic per morning ABG.  RT will continue to monitor.
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
## 2022-05-08 DIAGNOSIS — I5043 Acute on chronic combined systolic (congestive) and diastolic (congestive) heart failure: Secondary | ICD-10-CM | POA: Diagnosis not present

## 2022-05-08 DIAGNOSIS — J9601 Acute respiratory failure with hypoxia: Secondary | ICD-10-CM | POA: Diagnosis not present

## 2022-05-08 DIAGNOSIS — R7881 Bacteremia: Secondary | ICD-10-CM | POA: Insufficient documentation

## 2022-05-08 DIAGNOSIS — J9621 Acute and chronic respiratory failure with hypoxia: Secondary | ICD-10-CM | POA: Diagnosis not present

## 2022-05-08 DIAGNOSIS — J810 Acute pulmonary edema: Secondary | ICD-10-CM | POA: Diagnosis not present

## 2022-05-08 LAB — COMPREHENSIVE METABOLIC PANEL
ALT: 34 U/L (ref 0–44)
AST: 29 U/L (ref 15–41)
Albumin: 3.3 g/dL — ABNORMAL LOW (ref 3.5–5.0)
Alkaline Phosphatase: 48 U/L (ref 38–126)
Anion gap: 5 (ref 5–15)
BUN: 14 mg/dL (ref 6–20)
CO2: 39 mmol/L — ABNORMAL HIGH (ref 22–32)
Calcium: 9.3 mg/dL (ref 8.9–10.3)
Chloride: 92 mmol/L — ABNORMAL LOW (ref 98–111)
Creatinine, Ser: 0.8 mg/dL (ref 0.61–1.24)
GFR, Estimated: >60 mL/min (ref >60)
Glucose, Bld: 181 mg/dL — ABNORMAL HIGH (ref 70–99)
Potassium: 3.8 mmol/L (ref 3.5–5.1)
Sodium: 136 mmol/L (ref 135–145)
Total Bilirubin: 0.7 mg/dL (ref 0.3–1.2)
Total Protein: 7.8 g/dL (ref 6.5–8.1)

## 2022-05-08 LAB — CBC
HCT: 47.4 % (ref 39.0–52.0)
Hemoglobin: 15.3 g/dL (ref 13.0–17.0)
MCH: 28.4 pg (ref 26.0–34.0)
MCHC: 32.3 g/dL (ref 30.0–36.0)
MCV: 88.1 fL (ref 80.0–100.0)
Platelets: 140 10*3/uL — ABNORMAL LOW (ref 150–400)
RBC: 5.38 MIL/uL (ref 4.22–5.81)
RDW: 13.8 % (ref 11.5–15.5)
WBC: 13.2 10*3/uL — ABNORMAL HIGH (ref 4.0–10.5)
nRBC: 0 % (ref 0.0–0.2)

## 2022-05-08 LAB — GLUCOSE, CAPILLARY
Glucose-Capillary: 118 mg/dL — ABNORMAL HIGH (ref 70–99)
Glucose-Capillary: 165 mg/dL — ABNORMAL HIGH (ref 70–99)
Glucose-Capillary: 158 mg/dL — ABNORMAL HIGH (ref 70–99)
Glucose-Capillary: 183 mg/dL — ABNORMAL HIGH (ref 70–99)

## 2022-05-08 LAB — CULTURE, BLOOD (ROUTINE X 2)
Special Requests: ADEQUATE
Special Requests: ADEQUATE

## 2022-05-08 MED ORDER — TORSEMIDE 20 MG PO TABS
20.0000 mg | ORAL_TABLET | Freq: Two times a day (BID) | ORAL | Status: DC
  Administered 2022-05-08 – 2022-05-10 (×5): 20 mg via ORAL
  Filled 2022-05-08 (×5): qty 1

## 2022-05-08 NOTE — Assessment & Plan Note (Signed)
Bilateral buttocks, stage II, present on admission

## 2022-05-08 NOTE — Assessment & Plan Note (Addendum)
EF 20-25%.  Net negative 800cc yesterday with torsemide.    - Continue torsemide, spironolactone, metoprolol, potassium

## 2022-05-08 NOTE — Assessment & Plan Note (Signed)
See above

## 2022-05-08 NOTE — Assessment & Plan Note (Signed)
BMI 53 

## 2022-05-08 NOTE — Assessment & Plan Note (Addendum)
-   Continue Rocephin day 3 of 5

## 2022-05-08 NOTE — Progress Notes (Signed)
Having to hold IV rocephin until new PIV placed. Current PIV is positional and won't infuse. Attempted to place PIV on floor and was unsuccessful. IV team consult placed.
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
## 2022-05-08 NOTE — TOC Progression Note (Signed)
Transition of Care [facility]) - Progression Note    Patient Details  Name: Jordan Jensen MRN: [id number] Date of Birth: 1970-06-13  Transition of Care [facility]) CM/SW Contact  Graves-Bigelow, Lamar Laundry, RN Phone Number: 05/08/2022, 11:26 AM  Clinical Narrative: Patient was discussed in progression rounds. Case Manager has submitted ABG results and Overnight Pulse Oximetry to [employer]; [employer] will submit clinicals to insurance. Case Manager reviewed Physical Therapy notes and the patient recommendations is for no HH PT at this time. Case Manager will continue to follow for transition of care needs.     Expected Discharge Plan: Home w Home Health Services Barriers to Discharge: Continued Medical Work up  Expected Discharge Plan and Services In-house Referral: NA Discharge Planning Services: CM Consult   Living arrangements for the past 2 months: Single Family Home  Social Determinants of Health (SDOH) Interventions SDOH Screenings   Food Insecurity: No Food Insecurity (05/01/2022)  Housing: Low Risk  (05/01/2022)  Transportation Needs: Unmet Transportation Needs (05/01/2022)  Utilities: At Risk (05/01/2022)  Alcohol Screen: Low Risk  (03/11/2022)  Depression (PHQ2-9): Low Risk  (04/18/2022)  Financial Resource Strain: Low Risk  (03/11/2022)  Physical Activity: Inactive (03/11/2022)  Social Connections: Moderately Integrated (03/11/2022)  Stress: No Stress Concern Present (03/11/2022)  Tobacco Use: Medium Risk (05/02/2022)   Readmission Risk Interventions    09/09/2021   11:03 AM  Readmission Risk Prevention Plan  Post Dischage Appt Not Complete  Medication Screening Complete  Transportation Screening Complete
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
## 2022-05-08 NOTE — Assessment & Plan Note (Addendum)
BMP shows Bicarb >36, ABG and VBG show baseline pCO2 >60.  Pulse oximetry test overnight shows multiple desaturations. BiPAP to be delivered tonight. - Recommend BiPAP at night and with naps

## 2022-05-08 NOTE — Assessment & Plan Note (Addendum)
BP 150s - Continue metoprolol, spironolactone, torsemide - Outpatient titration

## 2022-05-08 NOTE — Assessment & Plan Note (Addendum)
Glucoses well controlled -Resume Ozempic at discharge - Continue sliding scale corrections

## 2022-05-08 NOTE — Assessment & Plan Note (Addendum)
Two different coag negative staph in separate blood cultures.  Likely contaminant. No further work up needed.  Discussed with ID on 5/2, in a bedbound patient, two different CoNS contaminants are still more likely than that these represent infection.
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
## 2022-05-08 NOTE — Assessment & Plan Note (Signed)
Continue Crestor 

## 2022-05-09 DIAGNOSIS — J9622 Acute and chronic respiratory failure with hypercapnia: Secondary | ICD-10-CM | POA: Diagnosis not present

## 2022-05-09 DIAGNOSIS — J810 Acute pulmonary edema: Secondary | ICD-10-CM | POA: Diagnosis not present

## 2022-05-09 DIAGNOSIS — I5043 Acute on chronic combined systolic (congestive) and diastolic (congestive) heart failure: Secondary | ICD-10-CM | POA: Diagnosis not present

## 2022-05-09 DIAGNOSIS — J9621 Acute and chronic respiratory failure with hypoxia: Secondary | ICD-10-CM | POA: Diagnosis not present

## 2022-05-09 DIAGNOSIS — J9601 Acute respiratory failure with hypoxia: Secondary | ICD-10-CM | POA: Diagnosis not present

## 2022-05-09 LAB — CBC
HCT: 49.1 % (ref 39.0–52.0)
Hemoglobin: 15.1 g/dL (ref 13.0–17.0)
MCH: 28.2 pg (ref 26.0–34.0)
MCHC: 30.8 g/dL (ref 30.0–36.0)
MCV: 91.6 fL (ref 80.0–100.0)
Platelets: 147 10*3/uL — ABNORMAL LOW (ref 150–400)
RBC: 5.36 MIL/uL (ref 4.22–5.81)
RDW: 13.8 % (ref 11.5–15.5)
WBC: 14 10*3/uL — ABNORMAL HIGH (ref 4.0–10.5)
nRBC: 0 % (ref 0.0–0.2)

## 2022-05-09 LAB — BASIC METABOLIC PANEL
Anion gap: 9 (ref 5–15)
BUN: 15 mg/dL (ref 6–20)
CO2: 37 mmol/L — ABNORMAL HIGH (ref 22–32)
Calcium: 9.1 mg/dL (ref 8.9–10.3)
Chloride: 92 mmol/L — ABNORMAL LOW (ref 98–111)
Creatinine, Ser: 0.84 mg/dL (ref 0.61–1.24)
GFR, Estimated: >60 mL/min (ref >60)
Glucose, Bld: 146 mg/dL — ABNORMAL HIGH (ref 70–99)
Potassium: 3.9 mmol/L (ref 3.5–5.1)
Sodium: 138 mmol/L (ref 135–145)

## 2022-05-09 LAB — GLUCOSE, CAPILLARY
Glucose-Capillary: 131 mg/dL — ABNORMAL HIGH (ref 70–99)
Glucose-Capillary: 119 mg/dL — ABNORMAL HIGH (ref 70–99)
Glucose-Capillary: 162 mg/dL — ABNORMAL HIGH (ref 70–99)
Glucose-Capillary: 132 mg/dL — ABNORMAL HIGH (ref 70–99)

## 2022-05-09 LAB — MAGNESIUM: Magnesium: 1.8 mg/dL (ref 1.7–2.4)

## 2022-05-09 LAB — CULTURE, BLOOD (ROUTINE X 2): Culture: NO GROWTH

## 2022-05-09 MED ORDER — POTASSIUM CHLORIDE 10 MEQ/100ML IV SOLN
10.0000 meq | Freq: Once | INTRAVENOUS | Status: AC
  Administered 2022-05-09: 10 meq via INTRAVENOUS
  Filled 2022-05-09: qty 100

## 2022-05-09 MED ORDER — MAGNESIUM SULFATE IN D5W 1-5 GM/100ML-% IV SOLN
1.0000 g | Freq: Once | INTRAVENOUS | Status: AC
  Administered 2022-05-09: 1 g via INTRAVENOUS
  Filled 2022-05-09: qty 100

## 2022-05-09 MED ORDER — ENOXAPARIN SODIUM 80 MG/0.8ML IJ SOSY
70.0000 mg | PREFILLED_SYRINGE | INTRAMUSCULAR | Status: DC
  Administered 2022-05-09: 70 mg via SUBCUTANEOUS
  Filled 2022-05-09: qty 0.8
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
## 2022-05-09 NOTE — TOC Transition Note (Signed)
Transition of Care [facility]) - CM/SW Discharge Note   Patient Details  Name: Jordan Jensen MRN: [id number] Date of Birth: [birth year]  Transition of Care [facility]) CM/SW Contact:  Gala Lewandowsky, RN Phone Number: 05/09/2022, 9:13 AM   Clinical Narrative: Case Manager received notification that insurance has approved the BIPAP for home. Respiratory with [employer] will be delivering the BIPAP to the room for education. Patient will transport home via [facility]. Checking with spouse to see if she will visit today to take the BIPAP home. Patient has no home health PT recommendation. Unable to arrange OT without another discipline added. No further needs identified at this time.   Final next level of care: Home/Self Care Barriers to Discharge: No Barriers Identified  Discharge Plan and Services Additional resources added to the After Visit Summary for   In-house Referral: NA Discharge Planning Services: CM Consult            DME Arranged: Bipap DME Agency: [employer] Date DME Agency Contacted: 05/09/22 Time DME Agency Contacted: [phone] Representative spoke with at DME Agency: Barbara Cower   Social Determinants of Health (SDOH) Interventions SDOH Screenings   Food Insecurity: No Food Insecurity (05/01/2022)  Housing: Low Risk  (05/01/2022)  Transportation Needs: Unmet Transportation Needs (05/01/2022)  Utilities: At Risk (05/01/2022)  Alcohol Screen: Low Risk  (03/11/2022)  Depression (PHQ2-9): Low Risk  (04/18/2022)  Financial Resource Strain: Low Risk  (03/11/2022)  Physical Activity: Inactive (03/11/2022)  Social Connections: Moderately Integrated (03/11/2022)  Stress: No Stress Concern Present (03/11/2022)  Tobacco Use: Medium Risk (05/02/2022)   Readmission Risk Interventions    09/09/2021   11:03 AM  Readmission Risk Prevention Plan  Post Dischage Appt Not Complete  Medication Screening Complete  Transportation Screening Complete
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
## 2022-05-09 NOTE — Progress Notes (Signed)
Pt hospital V60 pulled and replaced with home set up. Pt and family state they do not need assistance with home biPAP. RT informed pt and family member they will be available if needed. RT will cont to monitor.

## 2022-05-09 NOTE — TOC Progression Note (Signed)
Transition of Care [facility]) - Progression Note    Patient Details  Name: Jordan Jensen MRN: [id number] Date of Birth: 01/18/1970  Transition of Care [facility]) CM/SW Contact  Graves-Bigelow, Lamar Laundry, RN Phone Number: 05/09/2022, 3:28 PM  Clinical Narrative:  Patient is agreeable to home health services. Patient has used [place] before and is agreeable to home health PT/OT. Referral submitted to [facility] and start of care to begin within 24-48 hours post transition home. Spouse at the bedside and states patient will have aides via either [employer] or [employer] that will start next week. Family will call next Tuesday if they have not heard anything from the agency. BIPAP education will take place today. Spouse will transport BIPAP home and patient will transport via [facility] in am once d/c order goes in. Medical Necessity and Face Sheet will be completed for transport. Weekend Case Manager will need to change the date of transport and print with SS# on the paperwork. No further needs identified at this time.   Expected Discharge Plan: Home w Home Health Services Barriers to Discharge: No Barriers Identified  Expected Discharge Plan and Services In-house Referral: NA Discharge Planning Services: CM Consult Post Acute Care Choice: Home Health Living arrangements for the past 2 months: Single Family Home                 DME Arranged: Bipap DME Agency: [employer] Date DME Agency Contacted: 05/09/22 Time DME Agency Contacted: [phone] Representative spoke with at DME Agency: Barbara Cower HH Arranged: PT, OT [facility] Agency: [facility] Date [facility] Agency Contacted: 05/09/22 Time HH Agency Contacted: [phone] Representative spoke with at [facility] Agency: Kandee Keen   Social Determinants of Health (SDOH) Interventions SDOH Screenings   Food Insecurity: No Food Insecurity (05/01/2022)  Housing: Low Risk  (05/01/2022)  Transportation Needs: Unmet Transportation Needs (05/01/2022)  Utilities: At Risk (05/01/2022)  Alcohol Screen:  Low Risk  (03/11/2022)  Depression (PHQ2-9): Low Risk  (04/18/2022)  Financial Resource Strain: Low Risk  (03/11/2022)  Physical Activity: Inactive (03/11/2022)  Social Connections: Moderately Integrated (03/11/2022)  Stress: No Stress Concern Present (03/11/2022)  Tobacco Use: Medium Risk (05/02/2022)    Readmission Risk Interventions    09/09/2021   11:03 AM  Readmission Risk Prevention Plan  Post Dischage Appt Not Complete  Medication Screening Complete  Transportation Screening Complete
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
## 2022-05-09 NOTE — Care Management Important Message (Signed)
Important Message  Patient Details  Name: Jordan Jensen MRN: [id number] Date of Birth: June 26, 1970   Medicare Important Message Given:  Yes     Sherilyn Banker 05/09/2022, 4:10 PM

## 2022-05-09 NOTE — Progress Notes (Signed)
22 Beat run of vtach. Paged physician. New orders for potassium and magnesium; given. Slept with bipap for a few hours overnight. Complaints of the mask hurting his face/head/eye. Attempted adjusting but still hurting. Respiratory also attempted adjusting and replacing. Patient taking a break from bipap with 2L nasal cannula placed.

## 2022-05-10 DIAGNOSIS — J9621 Acute and chronic respiratory failure with hypoxia: Secondary | ICD-10-CM | POA: Diagnosis not present

## 2022-05-10 DIAGNOSIS — J9622 Acute and chronic respiratory failure with hypercapnia: Secondary | ICD-10-CM | POA: Diagnosis not present

## 2022-05-10 LAB — CBC
HCT: 45.9 % (ref 39.0–52.0)
Hemoglobin: 14.8 g/dL (ref 13.0–17.0)
MCH: 28.6 pg (ref 26.0–34.0)
MCHC: 32.2 g/dL (ref 30.0–36.0)
MCV: 88.8 fL (ref 80.0–100.0)
Platelets: 157 10*3/uL (ref 150–400)
RBC: 5.17 MIL/uL (ref 4.22–5.81)
RDW: 13.8 % (ref 11.5–15.5)
WBC: 14.3 10*3/uL — ABNORMAL HIGH (ref 4.0–10.5)
nRBC: 0 % (ref 0.0–0.2)

## 2022-05-10 LAB — GLUCOSE, CAPILLARY: Glucose-Capillary: 147 mg/dL — ABNORMAL HIGH (ref 70–99)

## 2022-05-10 MED ORDER — CEFDINIR 300 MG PO CAPS: 300.0000 mg | ORAL_CAPSULE | Freq: Two times a day (BID) | ORAL | 0 refills | Status: DC
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
## 2022-05-10 NOTE — Plan of Care (Signed)

## 2022-05-12 ENCOUNTER — Telehealth: Payer: Self-pay | Admitting: Internal Medicine

## 2022-05-12 ENCOUNTER — Telehealth: Payer: Self-pay

## 2022-05-12 DIAGNOSIS — G8250 Quadriplegia, unspecified: Secondary | ICD-10-CM

## 2022-05-12 LAB — GLUCOSE, CAPILLARY: Glucose-Capillary: 127 mg/dL — ABNORMAL HIGH (ref 70–99)

## 2022-05-12 NOTE — Telephone Encounter (Signed)
Transition Care Management Follow-up Telephone Call Date of discharge and from where: 05/10/22 from [facility] How have you been since you were released from the hospital? Feels better but a little sore from IVs Any questions or concerns? No  Items Reviewed: Did the pt receive and understand the discharge instructions provided? Yes  Medications obtained and verified? Yes  Other? No  Any new allergies since your discharge? Yes  Dietary orders reviewed? Yes Do you have support at home? Yes   Home Care and Equipment/Supplies: Were home health services ordered? yes If so, what is the name of the agency? [facility]  Has the agency set up a time to come to the patient's home? yes Were any new equipment or medical supplies ordered?  No What is the name of the medical supply agency? N/A Were you able to get the supplies/equipment? not applicable Do you have any questions related to the use of the equipment or supplies? No  Functional Questionnaire: (I = Independent and D = Dependent) ADLs: D  Bathing/Dressing- D  Meal Prep- I  Eating- I  Maintaining continence- I  Transferring/Ambulation- I  Managing Meds- I  Follow up appointments reviewed:  PCP Hospital f/u appt confirmed? Yes  Scheduled to see Dr Barbaraann Faster on 05/19/2022 @ 2:20. Specialist Hospital f/u appt confirmed? No  Scheduled to see N/A on N/A @ N/A. Are transportation arrangements needed? No  If their condition worsens, is the pt aware to call PCP or go to the Emergency Dept.? Yes Was the patient provided with contact information for the PCP's office or ED? Yes Was to pt encouraged to call back with questions or concerns? Yes

## 2022-05-12 NOTE — Telephone Encounter (Signed)
Patient wife called he was admitted and discharged from hospital Saturday, 05.04.2024 d/c summary said needs to be seen Thursday or Friday this week for blood work., can patient wait til Monday 5/13 if not can we overbook with provider Thursday or Friday there are no openings?  Call patient back [phone] to let her know.Check blood count from the bacteria infection.

## 2022-05-12 NOTE — Telephone Encounter (Signed)
TOC call done

## 2022-05-13 ENCOUNTER — Telehealth: Payer: Self-pay | Admitting: Internal Medicine

## 2022-05-13 NOTE — Telephone Encounter (Signed)
Ezzard Standing Nurse call [employer], [place], patient discharge on 05.04.2024 and facility ordering home health and has not heard anything yet on who will be coming out to do home health.  Call back # [phone] ext [phone]

## 2022-05-13 NOTE — Telephone Encounter (Signed)
LVM

## 2022-05-15 NOTE — Telephone Encounter (Signed)
Have left 3 VM and Jasmine December has not returned call.

## 2022-05-19 ENCOUNTER — Ambulatory Visit (INDEPENDENT_AMBULATORY_CARE_PROVIDER_SITE_OTHER): Payer: 59 | Admitting: Internal Medicine

## 2022-05-19 ENCOUNTER — Encounter: Payer: Self-pay | Admitting: Internal Medicine

## 2022-05-19 VITALS — BP 96/56 | HR 56 | Resp 17

## 2022-05-19 DIAGNOSIS — N3000 Acute cystitis without hematuria: Secondary | ICD-10-CM | POA: Diagnosis not present

## 2022-05-19 DIAGNOSIS — Z09 Encounter for follow-up examination after completed treatment for conditions other than malignant neoplasm: Secondary | ICD-10-CM | POA: Diagnosis not present

## 2022-05-19 DIAGNOSIS — R251 Tremor, unspecified: Secondary | ICD-10-CM | POA: Diagnosis not present

## 2022-05-19 NOTE — Patient Instructions (Addendum)
Thank you, [name] for allowing [place] to provide your care today.   I have ordered the following labs for you:   Lab Orders         Urine Culture         CBC with Differential         BMP8+EGFR         Urinalysis        Reminders: I will follow up with results. Keep appointment with Dr.Dixon for 5/24    Thurmon Fair, M.D.

## 2022-05-19 NOTE — Assessment & Plan Note (Signed)
Patient diuresed for congestive heart failure during hospital stay.  Will check BMP today.  Also had leukocytosis in setting of acute cystitis.  Will check CBC today.
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
## 2022-05-19 NOTE — Assessment & Plan Note (Addendum)
Review of records show patient was treated for Klebsiella UTI with 3 days of Rocephin and discharged on 2 days of cefdinir. He has had some return of his back pain which I am not sure is from his urinary tract infection or from his chronic lumbar back disease.  His habitus limits exam.  Will order UA with reflex to culture.

## 2022-05-19 NOTE — Assessment & Plan Note (Signed)
Patient has noticed tremor recently in both hands.  SPECT this is from elevated CO2 levels and should improve with BiPAP he is going to be on at home.  No further workup at this time

## 2022-05-20 ENCOUNTER — Telehealth: Payer: Self-pay | Admitting: Internal Medicine

## 2022-05-20 LAB — CBC WITH DIFFERENTIAL/PLATELET
Basophils Absolute: 0.1 10*3/uL (ref 0.0–0.2)
Basos: 1 %
EOS (ABSOLUTE): 0.2 10*3/uL (ref 0.0–0.4)
Eos: 2 %
Hematocrit: 44.1 % (ref 37.5–51.0)
Hemoglobin: 14.4 g/dL (ref 13.0–17.7)
Immature Grans (Abs): 0 10*3/uL (ref 0.0–0.1)
Immature Granulocytes: 0 %
Lymphocytes Absolute: 2.2 10*3/uL (ref 0.7–3.1)
Lymphs: 17 %
MCH: 28.3 pg (ref 26.6–33.0)
MCHC: 32.7 g/dL (ref 31.5–35.7)
MCV: 87 fL (ref 79–97)
Monocytes Absolute: 0.9 10*3/uL (ref 0.1–0.9)
Monocytes: 7 %
Neutrophils Absolute: 9.6 10*3/uL — ABNORMAL HIGH (ref 1.4–7.0)
Neutrophils: 73 %
Platelets: 191 10*3/uL (ref 150–450)
RBC: 5.09 x10E6/uL (ref 4.14–5.80)
RDW: 13.8 % (ref 11.6–15.4)
WBC: 13.1 10*3/uL — ABNORMAL HIGH (ref 3.4–10.8)

## 2022-05-20 LAB — BMP8+EGFR
BUN/Creatinine Ratio: 18 (ref 9–20)
BUN: 18 mg/dL (ref 6–24)
CO2: 29 mmol/L (ref 20–29)
Calcium: 9.7 mg/dL (ref 8.7–10.2)
Chloride: 95 mmol/L — ABNORMAL LOW (ref 96–106)
Creatinine, Ser: 1.02 mg/dL (ref 0.76–1.27)
Glucose: 85 mg/dL (ref 70–99)
Potassium: 4.4 mmol/L (ref 3.5–5.2)
Sodium: 140 mmol/L (ref 134–144)
eGFR: 88 mL/min/{1.73_m2} (ref >59)

## 2022-05-20 NOTE — Telephone Encounter (Signed)
Lab to run culture

## 2022-05-20 NOTE — Telephone Encounter (Signed)
Denzil Magnuson Physical therapist w. [facility]   Verbal orders  2 week 2 1 week 2   Call back [phone]

## 2022-05-20 NOTE — Telephone Encounter (Signed)
Patient daughter dropped off urine sample in lab

## 2022-05-20 NOTE — Telephone Encounter (Signed)
Verbal ok given.

## 2022-05-21 LAB — UA/M W/RFLX CULTURE, ROUTINE

## 2022-05-21 LAB — MICROSCOPIC EXAMINATION: Epithelial Cells (non renal): NONE SEEN /hpf (ref 0–10)

## 2022-05-22 ENCOUNTER — Telehealth: Payer: Self-pay | Admitting: Internal Medicine

## 2022-05-22 NOTE — Telephone Encounter (Signed)
Patient called in wants a call back with lab results.

## 2022-05-22 NOTE — Telephone Encounter (Signed)
Thank you :)

## 2022-05-22 NOTE — Telephone Encounter (Signed)
Spoke with patient about labs 

## 2022-05-23 ENCOUNTER — Ambulatory Visit (HOSPITAL_COMMUNITY)
Admit: 2022-05-23 | Discharge: 2022-05-23 | Disposition: A | Discharge status: Home / Self Care | Payer: 59 | Attending: Physician Assistant | Admitting: Physician Assistant

## 2022-05-23 ENCOUNTER — Encounter (HOSPITAL_COMMUNITY): Payer: Self-pay

## 2022-05-23 ENCOUNTER — Inpatient Hospital Stay (HOSPITAL_COMMUNITY)
Admission: RE | Admit: 2022-05-23 | Discharge: 2022-05-23 | Disposition: A | Discharge status: Home / Self Care | Payer: 59 | Source: Ambulatory Visit | Attending: Cardiology | Admitting: Cardiology

## 2022-05-23 ENCOUNTER — Other Ambulatory Visit (HOSPITAL_COMMUNITY): Payer: Self-pay | Admitting: Cardiology

## 2022-05-23 VITALS — BP 128/72 | HR 52

## 2022-05-23 DIAGNOSIS — I2510 Atherosclerotic heart disease of native coronary artery without angina pectoris: Secondary | ICD-10-CM | POA: Insufficient documentation

## 2022-05-23 DIAGNOSIS — Z79899 Other long term (current) drug therapy: Secondary | ICD-10-CM | POA: Diagnosis not present

## 2022-05-23 DIAGNOSIS — G4733 Obstructive sleep apnea (adult) (pediatric): Secondary | ICD-10-CM | POA: Diagnosis not present

## 2022-05-23 DIAGNOSIS — Z7901 Long term (current) use of anticoagulants: Secondary | ICD-10-CM | POA: Insufficient documentation

## 2022-05-23 DIAGNOSIS — I890 Lymphedema, not elsewhere classified: Secondary | ICD-10-CM | POA: Diagnosis not present

## 2022-05-23 DIAGNOSIS — G8220 Paraplegia, unspecified: Secondary | ICD-10-CM | POA: Diagnosis not present

## 2022-05-23 DIAGNOSIS — I5022 Chronic systolic (congestive) heart failure: Secondary | ICD-10-CM

## 2022-05-23 DIAGNOSIS — I493 Ventricular premature depolarization: Secondary | ICD-10-CM | POA: Insufficient documentation

## 2022-05-23 DIAGNOSIS — E785 Hyperlipidemia, unspecified: Secondary | ICD-10-CM | POA: Insufficient documentation

## 2022-05-23 DIAGNOSIS — I110 Hypertensive heart disease with heart failure: Secondary | ICD-10-CM | POA: Diagnosis present

## 2022-05-23 MED ORDER — LOSARTAN POTASSIUM 25 MG PO TABS: 25.0000 mg | ORAL_TABLET | Freq: Every day | ORAL | 3 refills | Status: DC

## 2022-05-23 NOTE — Patient Instructions (Addendum)
Thank you for coming in today  If you had labs drawn today, any labs that are abnormal the clinic will call you No news is good news  Your provider has recommended that  you wear a Zio Patch for 14 days.  This monitor will record your heart rhythm for our review.  IF you have any symptoms while wearing the monitor please press the button.  If you have any issues with the patch or you notice a red or orange light on it please call the company at [phone].  Once you remove the patch please mail it back to the company as soon as possible so we can get the results.   Medications: START Losartan 25 mg 1 tablet daily  Follow up appointments:  Your physician recommends that you schedule a follow-up appointment in:  2 months in clinic    Do the following things EVERYDAY: Weigh yourself in the morning before breakfast. Write it down and keep it in a log. Take your medicines as prescribed Eat low salt foods--Limit salt (sodium) to 2000 mg per day.  Stay as active as you can everyday Limit all fluids for the day to less than 2 liters   At the [facility], you and your health needs are our priority. As part of our continuing mission to provide you with exceptional heart care, we have created designated Provider Care Teams. These Care Teams include your primary Cardiologist (physician) and Advanced Practice Providers (APPs- Physician Assistants and Nurse Practitioners) who all work together to provide you with the care you need, when you need it.   You may see any of the following providers on your designated Care Team at your next follow up: Dr Arvilla Meres Dr Marca Ancona Dr. Marcos Eke, NP Robbie Lis, [place] [facility] [place], [place] Brynda Peon, NP Karle Plumber, PharmD   Please be sure to bring in all your medications bottles to every appointment.    Thank you for choosing [facility]  If you have any questions or concerns before your next appointment please send [place] a message through [place] or call our office at [phone].    TO LEAVE A MESSAGE FOR THE NURSE SELECT OPTION 2, PLEASE LEAVE A MESSAGE INCLUDING: YOUR NAME DATE OF BIRTH CALL BACK NUMBER REASON FOR CALL**this is important as we prioritize the call backs  YOU WILL RECEIVE A CALL BACK THE SAME DAY AS LONG AS YOU CALL BEFORE 4:00 PM

## 2022-05-23 NOTE — Progress Notes (Signed)
Zio patch placed onto patient.  All instructions and information reviewed with patient, they verbalize understanding with no questions. 
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
## 2022-05-24 LAB — MICROSCOPIC EXAMINATION
Casts: NONE SEEN /LPF
RBC, Urine: NONE SEEN /HPF (ref 0–2)
WBC, UA: NONE SEEN /hpf (ref 0–5)

## 2022-05-24 LAB — UA/M W/RFLX CULTURE, ROUTINE
Bilirubin, UA: NEGATIVE
Glucose, UA: NEGATIVE
Ketones, UA: NEGATIVE
Leukocytes,UA: NEGATIVE
Nitrite, UA: NEGATIVE
Protein,UA: NEGATIVE
RBC, UA: NEGATIVE
Specific Gravity, UA: 1.011 (ref 1.005–1.030)
Urobilinogen, Ur: 0.2 mg/dL (ref 0.2–1.0)
pH, UA: 6 (ref 5.0–7.5)

## 2022-05-24 LAB — URINE CULTURE, REFLEX

## 2022-05-25 ENCOUNTER — Telehealth: Payer: 59 | Admitting: Family Medicine

## 2022-05-25 DIAGNOSIS — L0231 Cutaneous abscess of buttock: Secondary | ICD-10-CM

## 2022-05-25 MED ORDER — CEPHALEXIN 500 MG PO CAPS: 500.0000 mg | ORAL_CAPSULE | Freq: Three times a day (TID) | ORAL | 0 refills | Status: AC

## 2022-05-25 NOTE — Patient Instructions (Signed)
Skin Abscess  A skin abscess is an infected area on or under your skin. It contains pus and other material. An abscess may also be called a furuncle, carbuncle, or boil. It is often the result of an infection caused by bacteria. An abscess can occur in or on almost any part of your body. Sometimes, an abscess may break open (rupture) on its own. In most cases, it will keep getting worse unless it is treated. An abscess can cause pain and make you feel ill. An untreated abscess can cause infection to spread to other parts of your body or your bloodstream. The abscess may need to be drained. You may also need to take antibiotics. What are the causes? An abscess occurs when germs, like bacteria, pass through your skin and cause an infection. This may be caused by: A scrape or cut on your skin. A puncture wound through your skin, such as a needle injection or insect bite. Blocked oil or sweat glands. Blocked and infected hair follicles. A fluid-filled sac that forms beneath your skin (sebaceous cyst) and becomes infected. What increases the risk? You may be more likely to develop an abscess if: You have problems with blood circulation, or you have a weak body defense system (immune system). You have diabetes. You have dry and irritated skin. You get injections often or use IV drugs. You have a foreign body in a wound, such as a splinter. You smoke or use tobacco products. What are the signs or symptoms? Symptoms of this condition include: A painful, firm bump under the skin. A bump with pus at the top. This may break through the skin and drain. Other symptoms include: Redness and swelling around the abscess. Warmth or tenderness. Swelling of the lymph nodes (glands) near the abscess. A sore on the skin. How is this diagnosed? This condition may be diagnosed based on a physical exam and your medical history. You may also have tests done, such as: A test of a sample of pus. This may be done  to find what is causing the infection. Blood tests. Imaging tests, such as an ultrasound, CT scan, or MRI. How is this treated? A small abscess that drains on its own may not need to be treated. Treatment for larger abscesses may include: Moist heat or a heat pack applied to the area a few times a day. Incision and drainage. This is a procedure to drain the abscess. Antibiotics. For a severe abscess, you may first get antibiotics through an IV and then change to antibiotics by mouth. Follow these instructions at home: Medicines Take over-the-counter and prescription medicines only as told by your provider. If you were prescribed antibiotics, take them as told by your provider. Do not stop using the antibiotic even if you start to feel better. Abscess care  If you have an abscess that has not drained, apply heat to the affected area. Use the heat source that your provider recommends, such as a moist heat pack or a heating pad. Place a towel between your skin and the heat source. Leave the heat on for 20-30 minutes at a time. If your skin turns bright red, remove the heat right away to prevent burns. The risk of burns is higher if you cannot feel pain, heat, or cold. Follow instructions from your provider about how to take care of your abscess. Make sure you: Cover the abscess with a bandage (dressing). Wash your hands with soap and water for at least 20 seconds before   and after you change the dressing or gauze. If soap and water are not available, use hand sanitizer. Change your dressing or gauze as told by your provider. Check your abscess every day for signs of an infection that is getting worse. Check for: More redness, swelling, pain, or tenderness. More fluid or blood. Warmth. More pus or a worse smell. General instructions To avoid spreading the infection: Do not share personal care items, towels, or hot tubs with others. Avoid making skin contact with other people. Be careful  when getting rid of used dressings, wound packing, or any drainage from the abscess. Do not use any products that contain nicotine or tobacco. These products include cigarettes, chewing tobacco, and vaping devices, such as e-cigarettes. If you need help quitting, ask your provider. Do not use any creams, ointments, or liquids unless you have been told to by your provider. Contact a health care provider if: You see redness that spreads quickly or red streaks on your skin spreading away from the abscess. You have any signs of worse infection at the abscess. You vomit every time you eat or drink. You have a fever, chills, or muscle aches. The cyst or abscess returns. Get help right away if: You have severe pain. You make less pee (urine) than normal. This information is not intended to replace advice given to you by your health care provider. Make sure you discuss any questions you have with your health care provider. Document Revised: 08/07/2021 Document Reviewed: 08/07/2021 Elsevier Patient Education  2023 Elsevier Inc.  
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
## 2022-05-28 ENCOUNTER — Telehealth: Payer: Self-pay | Admitting: Internal Medicine

## 2022-05-28 NOTE — Telephone Encounter (Signed)
Gave verbal orders to Clarence.

## 2022-05-28 NOTE — Telephone Encounter (Signed)
Kimberly Occupational therapist w. [facility] called in on patien behalf   Verbal orders for OT  1 week 3   Call back # [phone] Can leave vm w. Name and title  

## 2022-05-29 ENCOUNTER — Encounter: Payer: Self-pay | Admitting: Nurse Practitioner

## 2022-05-29 ENCOUNTER — Ambulatory Visit: Payer: 59 | Attending: Nurse Practitioner | Admitting: Nurse Practitioner

## 2022-05-29 VITALS — HR 48 | Ht 71.0 in

## 2022-05-29 DIAGNOSIS — G4733 Obstructive sleep apnea (adult) (pediatric): Secondary | ICD-10-CM

## 2022-05-29 DIAGNOSIS — I493 Ventricular premature depolarization: Secondary | ICD-10-CM | POA: Diagnosis not present

## 2022-05-29 DIAGNOSIS — I10 Essential (primary) hypertension: Secondary | ICD-10-CM

## 2022-05-29 DIAGNOSIS — I5020 Unspecified systolic (congestive) heart failure: Secondary | ICD-10-CM | POA: Diagnosis not present

## 2022-05-29 DIAGNOSIS — E785 Hyperlipidemia, unspecified: Secondary | ICD-10-CM

## 2022-05-29 DIAGNOSIS — Z86718 Personal history of other venous thrombosis and embolism: Secondary | ICD-10-CM

## 2022-05-29 DIAGNOSIS — I4729 Other ventricular tachycardia: Secondary | ICD-10-CM

## 2022-05-29 DIAGNOSIS — R001 Bradycardia, unspecified: Secondary | ICD-10-CM

## 2022-05-29 DIAGNOSIS — I890 Lymphedema, not elsewhere classified: Secondary | ICD-10-CM

## 2022-05-29 NOTE — Patient Instructions (Addendum)
Medication Instructions:  Your physician has recommended you make the following change in your medication:  Decrease metoprolol succinate to 12.5 mg daily Continue other medications the same  Labwork: none  Testing/Procedures: none  Follow-Up: Your physician recommends that you schedule a follow-up appointment in: 6-8 weeks  Any Other Special Instructions Will Be Listed Below (If Applicable). Your physician has requested that you regularly monitor and record your blood pressure readings at home. Please use the same machine at the same time of day to check your readings and record them.  If you need a refill on your cardiac medications before your next appointment, please call your pharmacy.
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
## 2022-05-30 ENCOUNTER — Ambulatory Visit (INDEPENDENT_AMBULATORY_CARE_PROVIDER_SITE_OTHER): Payer: 59 | Admitting: Internal Medicine

## 2022-05-30 ENCOUNTER — Encounter: Payer: Self-pay | Admitting: Internal Medicine

## 2022-05-30 VITALS — BP 119/72 | HR 86 | Ht 71.0 in | Wt 389.0 lb

## 2022-05-30 DIAGNOSIS — G8220 Paraplegia, unspecified: Secondary | ICD-10-CM

## 2022-05-30 DIAGNOSIS — Z6841 Body Mass Index (BMI) 40.0 and over, adult: Secondary | ICD-10-CM

## 2022-05-30 DIAGNOSIS — E119 Type 2 diabetes mellitus without complications: Secondary | ICD-10-CM

## 2022-05-30 MED ORDER — METOPROLOL SUCCINATE ER 25 MG PO TB24: 12.5000 mg | ORAL_TABLET | Freq: Every day | ORAL | 3 refills | Status: DC

## 2022-05-30 MED ORDER — OZEMPIC (0.25 OR 0.5 MG/DOSE) 2 MG/3ML ~~LOC~~ SOPN
0.5000 mg | PEN_INJECTOR | SUBCUTANEOUS | 0 refills | Status: DC
Start: 2022-05-30 — End: 2022-07-08

## 2022-05-30 NOTE — Patient Instructions (Signed)
It was a pleasure to see you today.  Thank you for giving [place] the opportunity to be involved in your care.  Below is a brief recap of your visit and next steps.  We will plan to see you again in 3 months.  Summary Increase Ozempic to 0.5 mg weekly. We will plan to increase to to 1 mg weekly after 4 injections. We will give you contact for [facility] Podiatry referral placed Urine study ordered Follow up in 3 months
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
## 2022-06-01 LAB — MICROALBUMIN / CREATININE URINE RATIO
Creatinine, Urine: 194.8 mg/dL
Microalb/Creat Ratio: 6 mg/g creat (ref 0–29)
Microalbumin, Urine: 12.2 ug/mL

## 2022-06-05 ENCOUNTER — Encounter: Payer: Self-pay | Admitting: Internal Medicine

## 2022-06-05 NOTE — Assessment & Plan Note (Signed)
Recent diagnosis.  A1c 6.7 in December.  Ozempic was started last month.  He has not experienced any adverse side effects since starting medication. -Increase Ozempic to 0.5 mg weekly.  Plan to further increase to 1 mg weekly after 4 weeks. -Urine microalbumin/creatinine ratio ordered today -Previously referred to ophthalmology for diabetic eye exam.  Patient was provided with contact information for ophthalmology today. -Podiatry referral placed for diabetic foot exam

## 2022-06-05 NOTE — Assessment & Plan Note (Signed)
He has recently started Ozempic.  Weight today is 389 pounds, which is down from 415 pounds in early April.  Unclear how much of this is water weight as he was diuresed 14 L during recent hospital admission.  He has not experienced any adverse side effects since starting Ozempic. -Increasing Ozempic as noted above.  We again reviewed lifestyle modifications aimed at weight loss as well.

## 2022-06-06 ENCOUNTER — Telehealth: Payer: Self-pay | Admitting: Cardiology

## 2022-06-06 ENCOUNTER — Telehealth: Payer: Self-pay | Admitting: Internal Medicine

## 2022-06-06 NOTE — Telephone Encounter (Signed)
Denzil Magnuson called from [facility] said the patient heart rate is running low in the mid 40's. Nurse will check later today. No chest pain, no trouble breathing at rest low heart rate. Any questions call ben at [phone]

## 2022-06-06 NOTE — Telephone Encounter (Signed)
Left a message for Britta Mccreedy, RN to call office back regarding patient.

## 2022-06-06 NOTE — Telephone Encounter (Signed)
Patient notified and verbalized understanding of stopping Metoprolol. Patient stated that he mailed his monitor back this morning that he wore for the last two weeks.

## 2022-06-06 NOTE — Telephone Encounter (Signed)
STAT if HR is under 50 or over 120 (normal HR is 60-100 beats per minute)  What is your heart rate? HR ranging 42-90 in 2 min time span   Do you have a log of your heart rate readings (document readings)? Just range. RN was driving at time of call  Do you have any other symptoms? No   BP today 104/58 the 1st time and 102/62 the second time. She reports the PT also advised they were picking up this range of HR with the patient as well.   Patient does not c/o any symptoms.

## 2022-06-09 NOTE — Telephone Encounter (Signed)
Spoke with patient and feeling better.

## 2022-06-12 ENCOUNTER — Ambulatory Visit: Payer: 59 | Admitting: Podiatry

## 2022-06-19 ENCOUNTER — Ambulatory Visit (INDEPENDENT_AMBULATORY_CARE_PROVIDER_SITE_OTHER): Payer: 59 | Admitting: Podiatry

## 2022-06-19 DIAGNOSIS — M79674 Pain in right toe(s): Secondary | ICD-10-CM

## 2022-06-19 DIAGNOSIS — B351 Tinea unguium: Secondary | ICD-10-CM

## 2022-06-19 DIAGNOSIS — M79675 Pain in left toe(s): Secondary | ICD-10-CM | POA: Diagnosis not present

## 2022-06-19 DIAGNOSIS — E1142 Type 2 diabetes mellitus with diabetic polyneuropathy: Secondary | ICD-10-CM | POA: Diagnosis not present

## 2022-06-19 NOTE — Progress Notes (Signed)
  Subjective:  Patient ID: Jordan Jensen, male    DOB: 31-Jul-1970,  MRN: [id number]  Chief Complaint  Patient presents with   Foot Problem    PCP requested that the pt has his feet looked at since he is diabetic and wants to check about a possible ingrown nail     52 y.o. male presents with the above complaint. History confirmed with patient. Patient presenting with pain related to dystrophic thickened elongated nails. Patient is unable to trim own nails related to nail dystrophy and/or mobility issues. Patient does  have a history of T2DM.   Objective:  Physical Exam: warm, good capillary refill nail exam onychomycosis of the toenails, ingrown nail at left hallux, onycholysis, and dystrophic nails DP pulses palpable, PT pulses palpable, and protective sensation intact Left Foot:  Pain with palpation of nails due to elongation and dystrophic growth. Thickening of left hallux nail.  Right Foot: Pain with palpation of nails due to elongation and dystrophic growth.   Assessment:   1. Pain due to onychomycosis of toenails of both feet   2. DM type 2 with diabetic peripheral neuropathy (HCC)      Plan:  Patient was evaluated and treated and all questions answered.  #Patient educated on diabetes. Discussed proper diabetic foot care and discussed risks and complications of disease. Educated patient in depth on reasons to return to the office immediately should he/she discover anything concerning or new on the feet. All questions answered. Discussed proper shoes as well.   #Onychomycosis with pain  -Nails palliatively debrided as below. -Educated on self-care  Procedure: Nail Debridement Rationale: Pain Type of Debridement: manual, sharp debridement. Instrumentation: Nail nipper, rotary burr. Number of Nails: 10  Return in about 3 months (around 09/19/2022) for [facility].         Corinna Gab, DPM [facility] / [facility]

## 2022-06-21 ENCOUNTER — Telehealth: Payer: 59 | Admitting: Physician Assistant

## 2022-06-21 NOTE — Progress Notes (Signed)
Unable to connect with patient  .

## 2022-06-25 ENCOUNTER — Telehealth: Payer: 59 | Admitting: Nurse Practitioner

## 2022-06-25 ENCOUNTER — Telehealth: Payer: Self-pay | Admitting: Internal Medicine

## 2022-06-25 DIAGNOSIS — R300 Dysuria: Secondary | ICD-10-CM | POA: Diagnosis not present

## 2022-06-25 NOTE — Telephone Encounter (Signed)
Patient calling stating that he has a bladder infection and doesn't have a ride and wanting to know if someone could call him and send something to [facility] in [place] pharmacy
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
## 2022-07-02 ENCOUNTER — Telehealth: Payer: Self-pay | Admitting: Internal Medicine

## 2022-07-02 NOTE — Telephone Encounter (Signed)
Prescription Request  07/02/2022  LOV: 05/30/2022  What is the name of the medication or equipment?   [phone]   Have you contacted your pharmacy to request a refill? Yes   Which pharmacy would you like this sent to?  [facility] [address], [place] - [address] [place] [address] Phone: [phone] Fax: [phone]    Patient notified that their request is being sent to the clinical staff for review and that they should receive a response within 2 business days.   Please advise at [phone].

## 2022-07-03 ENCOUNTER — Ambulatory Visit: Payer: 59 | Attending: Nurse Practitioner | Admitting: Nurse Practitioner

## 2022-07-03 ENCOUNTER — Encounter: Payer: Self-pay | Admitting: Nurse Practitioner

## 2022-07-03 ENCOUNTER — Encounter: Payer: Self-pay | Admitting: Internal Medicine

## 2022-07-03 ENCOUNTER — Telehealth: Payer: Self-pay | Admitting: Nurse Practitioner

## 2022-07-03 ENCOUNTER — Telehealth (INDEPENDENT_AMBULATORY_CARE_PROVIDER_SITE_OTHER): Payer: 59 | Admitting: Internal Medicine

## 2022-07-03 VITALS — BP 110/72 | HR 44 | Ht 71.0 in

## 2022-07-03 DIAGNOSIS — I890 Lymphedema, not elsewhere classified: Secondary | ICD-10-CM

## 2022-07-03 DIAGNOSIS — I441 Atrioventricular block, second degree: Secondary | ICD-10-CM

## 2022-07-03 DIAGNOSIS — I455 Other specified heart block: Secondary | ICD-10-CM

## 2022-07-03 DIAGNOSIS — R001 Bradycardia, unspecified: Secondary | ICD-10-CM

## 2022-07-03 DIAGNOSIS — I10 Essential (primary) hypertension: Secondary | ICD-10-CM

## 2022-07-03 DIAGNOSIS — N3000 Acute cystitis without hematuria: Secondary | ICD-10-CM

## 2022-07-03 DIAGNOSIS — I493 Ventricular premature depolarization: Secondary | ICD-10-CM | POA: Diagnosis not present

## 2022-07-03 DIAGNOSIS — E785 Hyperlipidemia, unspecified: Secondary | ICD-10-CM

## 2022-07-03 DIAGNOSIS — I5020 Unspecified systolic (congestive) heart failure: Secondary | ICD-10-CM

## 2022-07-03 DIAGNOSIS — I4729 Other ventricular tachycardia: Secondary | ICD-10-CM | POA: Diagnosis not present

## 2022-07-03 LAB — POCT URINALYSIS DIP (CLINITEK)
Bilirubin, UA: NEGATIVE
Glucose, UA: NEGATIVE mg/dL
Ketones, POC UA: NEGATIVE mg/dL
Nitrite, UA: NEGATIVE
POC PROTEIN,UA: NEGATIVE
Spec Grav, UA: 1.015 (ref 1.010–1.025)
Urobilinogen, UA: 0.2 E.U./dL
pH, UA: 6 (ref 5.0–8.0)

## 2022-07-03 MED ORDER — SULFAMETHOXAZOLE-TRIMETHOPRIM 800-160 MG PO TABS
1.0000 | ORAL_TABLET | Freq: Two times a day (BID) | ORAL | 0 refills | Status: DC
Start: 2022-07-03 — End: 2022-09-12

## 2022-07-03 NOTE — Progress Notes (Addendum)
Cardiology Office Note:  .   Date:  07/03/2022  ID:  Jordan Jensen, DOB 1970/02/15, MRN [id number] PCP: Billie Lade, MD  [facility] Providers Cardiologist:  Donato Schultz, MD Advanced Heart Failure:  Dorthula Nettles, DO    History of Present Illness: .   Jordan Jensen is a 52 y.o. male with a PMH of HFrEF, HTN, HLD, chronic lymphedema, spinal cord injury (paraplegia), obesity, OSA on BiPAP, DVT (diagnosed 08/2021 - resolved), and GERD, who presents today for scheduled follow-up.  Admitted to [facility] in August 2023, echocardiogram at that time revealed EF 20 to 25%. Diagnosed with DVT during admission, completed Eliquis therapy, has resolved. Followed by HF Clinic.   Today he presents for follow-up.  Doing well from a cardiac perspective.  Shows me his log of HR, BP, and O2. BP overall well controlled. HR ranging 40's-50's. O2 sat averaging in low 90's. Denies any chest pain, shortness of breath, palpitations, syncope, presyncope, dizziness, orthopnea, PND, swelling or significant weight changes, acute bleeding, or claudication. Compliant with BiPAP usage at night. Compliant with his medications.   Studies Reviewed: Marland Kitchen       Preliminary monitor report 06/2022:  Patch Wear Time:  13 days and 20 hours (2024-05-17T16:17:27-0400 to 2024-05-31T13:03:54-0400)   Patient had a min HR of 24 bpm, max HR of 190 bpm, and avg HR of 83 bpm. Predominant underlying rhythm was Sinus Rhythm. First Degree AV Block was present. 10 Ventricular Tachycardia runs occurred, the run with the fastest interval lasting 5 beats with a  max rate of 190 bpm, the longest lasting 11.4 secs with an avg rate of 149 bpm. 1 Pause occurred lasting 3.2 secs (20 bpm). Some Pause(s) occurred due to Second Degree AV Block-Mobitz I. Second Degree AV Block-Mobitz I (Wenckebach) was present. Isolated  SVEs were rare (<1.0%), SVE Couplets were rare (<1.0%), and SVE Triplets were rare (<1.0%). Isolated VEs were frequent (18.4%,  [id number]), VE Couplets were rare (<1.0%, 6338), and VE Triplets were rare (<1.0%, 693). Ventricular Bigeminy and Trigeminy were  present.  Echo 04/2022: 1. Left ventricular ejection fraction, by estimation, is 20 to 25%. The  left ventricle has severely decreased function. The left ventricle  demonstrates global hypokinesis. Left ventricular diastolic parameters are  consistent with Grade I diastolic  dysfunction (impaired relaxation).   2. Right ventricular systolic function was not well visualized. The right  ventricular size is mildly enlarged. Tricuspid regurgitation signal is  inadequate for assessing PA pressure.   3. The mitral valve was not well visualized. No evidence of mitral valve  regurgitation. No evidence of mitral stenosis.   4. The aortic valve was not well visualized. Aortic valve regurgitation  is not visualized. No aortic stenosis is present.   5. Aortic dilatation noted. There is borderline dilatation of the aortic  root, measuring 38 mm. There is borderline dilatation of the ascending  aorta, measuring 38 mm.   Comparison(s): No significant change from prior study.  CCTA 03/2022: IMPRESSION: 1. Coronary calcium score of 529. This was 68 percentile for age and sex matched control.   2. Normal coronary origin with right dominance.   3. CAD-RADS 1. Minimal non-obstructive CAD (0-24%). Consider non-atherosclerotic causes of chest pain. Consider preventive therapy and risk factor modification.   The noncardiac portion of this study will be interpreted in separate report by the radiologist.   IMPRESSION: Bibasilar volume loss, consolidation or scarring which is similar to the prior study. Risk Assessment/Calculations:  The 10-year ASCVD risk score (Arnett DK, et al., 2019) is: 13.7%   Values used to calculate the score:     Age: 39 years     Sex: Male     Is Non-Hispanic African American: Yes     Diabetic: Yes     Tobacco smoker: No     Systolic Blood  Pressure: 110 mmHg     Is BP treated: Yes     HDL Cholesterol: 37 mg/dL     Total Cholesterol: 134 mg/dL  Physical Exam:   VS:  BP 110/72   Pulse (!) 44   Ht 5\' 11"  (1.803 m)   SpO2 94%   BMI 54.25 kg/m    Wt Readings from Last 3 Encounters:  05/30/22 (!) 389 lb (176.4 kg)  05/09/22 292 lb 4.8 oz (132.6 kg)  04/18/22 (!) 415 lb (188.2 kg)    GEN: Morbidly obese, 52 year old male in no acute distress NECK: No JVD; No carotid bruits CARDIAC: S1/S2, irregular rhythm and slow rate, no murmurs, rubs, gallops RESPIRATORY:  Clear to auscultation without rales, wheezing or rhonchi  EXTREMITIES:  Generalized nonpitting edema and lymphedema noted to BLE; paraplegic  ASSESSMENT AND PLAN: .   HFrEF Stage C, NYHA class 3, difficult to ascertain d/t body habitus and paraplegia. Likely NICM as CCTA showed minimal CAD. Echo 04/2022 revealed EF 20-25%. Unable to obtain weight today. Denies any symptoms of acute decompensation. GDMT limited. Continue Torsemide, potassium, Aldactone, losartan, previously stopped Metoprolol d/t bradycardia. Not a good candidate for SGLT2i. Cannot uptitrate GDMT at this time - see #3 below. Low sodium diet, fluid restriction <2L, and daily weights encouraged. Educated to contact our office for increase in swelling, increasing SHOB, or weight gain of 2 lbs overnight or 5 lbs in one week. Continue to follow-up with HF clinic as scheduled and recommend considering genetic testing as heart disease runs in his family. Will limited Echo, will defer to HF clinic.    NSVT/VT, hx of PVC's, bradycardia, sinus pause, second degree AV block PVC's noted on previous ECG, and on tele during recent admit. Was noted to have 20 beat run of NSVT during past hospitalization. Asymptomatic. See preliminary monitor report noted above, concerning for NSVT, VT, ventricular ectopy/PVC's, sinus pauses (around 3 seconds), and second degree AV block.   Addendum: Have messaged DOD, Dr. Dina Rich.  Recommended to defer EP referral to HF clinic. Will cancel limited Echo and defer this to them at this time.    3. HTN Improved and overall stable readings since stopping Metoprolol. Continue current medication regimen. Discussed to continue to monitor and log BP at home at least 2 hours before and after medications and sitting for 5-10 minutes. Discussed when to notify office. Heart healthy diet encouraged. ED precautions discussed.   HLD Denies any issues. Continue rosuvastatin. At next visit, consider obtaining/requesting labs. Heart healthy diet encouraged. Continue to follow with PCP.   Lymphedema Continue compression sleeves and leg pumps at home.    Morbid obesity  Weight loss via diet encouraged. Discussed the impact being overweight would have on cardiovascular risk.   7. OSA Currently on Bipap. Continue to follow-up with Pulmonary.    Dispo: Follow-up with me or APP in 2-3 months or sooner if anything changes.   Signed, Sharlene Dory, NP

## 2022-07-03 NOTE — Telephone Encounter (Signed)
Checking percert on the following patient for testing scheduled at [facility].   Limited echo  08/13/2022

## 2022-07-03 NOTE — Patient Instructions (Addendum)
Medication Instructions:  Your physician recommends that you continue on your current medications as directed. Please refer to the Current Medication list given to you today.  Labwork: none   Follow-Up: Your physician recommends that you schedule a follow-up appointment in: 2-3 months with Philis Nettle  Any Other Special Instructions Will Be Listed Below (If Applicable).  If you need a refill on your cardiac medications before your next appointment, please call your pharmacy.
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
## 2022-07-06 LAB — URINE CULTURE

## 2022-07-08 ENCOUNTER — Telehealth: Payer: Self-pay | Admitting: Internal Medicine

## 2022-07-08 MED ORDER — SEMAGLUTIDE-WEIGHT MANAGEMENT 1 MG/0.5ML ~~LOC~~ SOAJ
1.0000 mg | SUBCUTANEOUS | 0 refills | Status: DC
Start: 2022-07-08 — End: 2022-07-30

## 2022-07-08 NOTE — Telephone Encounter (Signed)
Prescription Request  07/08/2022  LOV: 05/30/2022  What is the name of the medication or equipment? Semaglutide,0.25 or 0.5MG /DOS, (OZEMPIC, 0.25 OR 0.5 MG/DOSE,) 2 MG/3ML SOPN   Have you contacted your pharmacy to request a refill? No   Which pharmacy would you like this sent to?  [facility] [address] [place] - [address] [place] [address] Phone: [phone] Fax: [phone]    Patient notified that their request is being sent to the clinical staff for review and that they should receive a response within 2 business days.   Please advise at Mobile [phone] (mobile)

## 2022-07-12 ENCOUNTER — Other Ambulatory Visit: Payer: Self-pay | Admitting: Internal Medicine

## 2022-07-12 DIAGNOSIS — E119 Type 2 diabetes mellitus without complications: Secondary | ICD-10-CM

## 2022-07-14 DIAGNOSIS — J449 Chronic obstructive pulmonary disease, unspecified: Secondary | ICD-10-CM | POA: Diagnosis not present

## 2022-07-14 DIAGNOSIS — G8250 Quadriplegia, unspecified: Secondary | ICD-10-CM | POA: Diagnosis not present

## 2022-07-14 DIAGNOSIS — M6281 Muscle weakness (generalized): Secondary | ICD-10-CM | POA: Diagnosis not present

## 2022-07-14 NOTE — Telephone Encounter (Signed)
Patient called on 07/11/22 left a voicemail afternoon has not yet received this medication.

## 2022-07-14 NOTE — Telephone Encounter (Signed)
Spoke with patient and patient stated that medication was at the pharmacy.

## 2022-07-25 ENCOUNTER — Ambulatory Visit (HOSPITAL_COMMUNITY)
Admission: RE | Admit: 2022-07-25 | Discharge: 2022-07-25 | Disposition: A | Discharge status: Home / Self Care | Payer: 59 | Source: Ambulatory Visit | Attending: Family Medicine | Admitting: Family Medicine

## 2022-07-25 ENCOUNTER — Encounter (HOSPITAL_COMMUNITY): Payer: Self-pay

## 2022-07-25 VITALS — BP 102/64 | HR 88

## 2022-07-25 DIAGNOSIS — Z79899 Other long term (current) drug therapy: Secondary | ICD-10-CM | POA: Diagnosis not present

## 2022-07-25 DIAGNOSIS — Z7985 Long-term (current) use of injectable non-insulin antidiabetic drugs: Secondary | ICD-10-CM | POA: Diagnosis not present

## 2022-07-25 DIAGNOSIS — I890 Lymphedema, not elsewhere classified: Secondary | ICD-10-CM | POA: Insufficient documentation

## 2022-07-25 DIAGNOSIS — Z86718 Personal history of other venous thrombosis and embolism: Secondary | ICD-10-CM | POA: Insufficient documentation

## 2022-07-25 DIAGNOSIS — E785 Hyperlipidemia, unspecified: Secondary | ICD-10-CM | POA: Diagnosis not present

## 2022-07-25 DIAGNOSIS — I2510 Atherosclerotic heart disease of native coronary artery without angina pectoris: Secondary | ICD-10-CM | POA: Insufficient documentation

## 2022-07-25 DIAGNOSIS — G8220 Paraplegia, unspecified: Secondary | ICD-10-CM | POA: Insufficient documentation

## 2022-07-25 DIAGNOSIS — Z5982 Transportation insecurity: Secondary | ICD-10-CM | POA: Insufficient documentation

## 2022-07-25 DIAGNOSIS — I493 Ventricular premature depolarization: Secondary | ICD-10-CM | POA: Insufficient documentation

## 2022-07-25 DIAGNOSIS — Z87891 Personal history of nicotine dependence: Secondary | ICD-10-CM | POA: Diagnosis not present

## 2022-07-25 DIAGNOSIS — I5022 Chronic systolic (congestive) heart failure: Secondary | ICD-10-CM | POA: Insufficient documentation

## 2022-07-25 DIAGNOSIS — I110 Hypertensive heart disease with heart failure: Secondary | ICD-10-CM | POA: Insufficient documentation

## 2022-07-25 DIAGNOSIS — G4733 Obstructive sleep apnea (adult) (pediatric): Secondary | ICD-10-CM | POA: Insufficient documentation

## 2022-07-25 LAB — BASIC METABOLIC PANEL
Anion gap: 10 (ref 5–15)
BUN: 12 mg/dL (ref 6–20)
CO2: 31 mmol/L (ref 22–32)
Calcium: 9 mg/dL (ref 8.9–10.3)
Chloride: 95 mmol/L — ABNORMAL LOW (ref 98–111)
Creatinine, Ser: 0.97 mg/dL (ref 0.61–1.24)
GFR, Estimated: >60 mL/min (ref >60)
Glucose, Bld: 107 mg/dL — ABNORMAL HIGH (ref 70–99)
Potassium: 3.7 mmol/L (ref 3.5–5.1)
Sodium: 136 mmol/L (ref 135–145)

## 2022-07-25 LAB — BRAIN NATRIURETIC PEPTIDE: B Natriuretic Peptide: 48.6 pg/mL (ref 0.0–100.0)

## 2022-07-25 LAB — MAGNESIUM: Magnesium: 2.1 mg/dL (ref 1.7–2.4)

## 2022-07-25 MED ORDER — METOPROLOL SUCCINATE ER 25 MG PO TB24: 12.5000 mg | ORAL_TABLET | Freq: Every day | ORAL | 3 refills | Status: DC
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
## 2022-07-25 NOTE — Patient Instructions (Addendum)
Medication Changes:  START taking Toprol XL 12.5 mg (half a tablet) DAILY    We recommend that you continue on your current medications as directed. Please refer to the Current Medication list given to you today.   *If you need a refill on your cardiac medications before your next appointment, please call your pharmacy*  Lab Work:  Labs done today, your results will be available in MyChart, we will contact you for abnormal readings.  Referrals:  You have been referred to electrical physiology. They will call you to schedule an appointment.    Special Instructions // Education:  Your physician has requested that you have an echocardiogram. Echocardiography is a painless test that uses sound waves to create images of your heart. It provides your doctor with information about the size and shape of your heart and how well your heart's chambers and valves are working. This procedure takes approximately one hour. There are no restrictions for this procedure. Please do NOT wear cologne, perfume, aftershave, or lotions (deodorant is allowed). Please arrive 15 minutes prior to your appointment time.   Follow-Up in:   Your physician recommends that you schedule a follow-up appointment in: 6 weeks with APP   And 2-3 months with Dr. Gasper Lloyd    Do the following things EVERYDAY: Weigh yourself in the morning before breakfast. Write it down and keep it in a log. Take your medicines as prescribed Eat low salt foods--Limit salt (sodium) to 2000 mg per day.  Stay as active as you can everyday Limit all fluids for the day to less than 2 liters    Need to Contact us:  If you have any questions or concerns before your next appointment please send [place] a message through [place] or call our office at [phone].    TO LEAVE A MESSAGE FOR THE NURSE SELECT OPTION 2, PLEASE LEAVE A MESSAGE INCLUDING: YOUR NAME DATE OF BIRTH CALL BACK NUMBER REASON FOR CALL**this is important as we prioritize the  call backs  YOU WILL RECEIVE A CALL BACK THE SAME DAY AS LONG AS YOU CALL BEFORE 4:00 PM   At the [facility], you and your health needs are our priority. As part of our continuing mission to provide you with exceptional heart care, we have created designated Provider Care Teams. These Care Teams include your primary Cardiologist (physician) and Advanced Practice Providers (APPs- Physician Assistants and Nurse Practitioners) who all work together to provide you with the care you need, when you need it.   You may see any of the following providers on your designated Care Team at your next follow up: Dr Arvilla Meres Dr Marca Ancona Dr. Marcos Eke, NP Robbie Lis, [place] [facility] [place], [place] Brynda Peon, NP Karle Plumber, PharmD   Please be sure to bring in all your medications bottles to every appointment.    Thank you for choosing [facility]

## 2022-07-30 ENCOUNTER — Encounter: Payer: Self-pay | Admitting: Internal Medicine

## 2022-07-30 ENCOUNTER — Ambulatory Visit: Payer: 59 | Admitting: Urology

## 2022-07-30 ENCOUNTER — Ambulatory Visit (INDEPENDENT_AMBULATORY_CARE_PROVIDER_SITE_OTHER): Payer: 59 | Admitting: Internal Medicine

## 2022-07-30 VITALS — BP 100/67 | HR 44 | Ht 71.0 in | Wt 389.0 lb

## 2022-07-30 DIAGNOSIS — N3001 Acute cystitis with hematuria: Secondary | ICD-10-CM | POA: Diagnosis not present

## 2022-07-30 DIAGNOSIS — Z789 Other specified health status: Secondary | ICD-10-CM | POA: Diagnosis not present

## 2022-07-30 DIAGNOSIS — R300 Dysuria: Secondary | ICD-10-CM

## 2022-07-30 DIAGNOSIS — G8220 Paraplegia, unspecified: Secondary | ICD-10-CM

## 2022-07-30 DIAGNOSIS — Z7409 Other reduced mobility: Secondary | ICD-10-CM | POA: Diagnosis not present

## 2022-07-30 DIAGNOSIS — Z6841 Body Mass Index (BMI) 40.0 and over, adult: Secondary | ICD-10-CM

## 2022-07-30 LAB — POCT URINALYSIS DIP (CLINITEK)
Bilirubin, UA: NEGATIVE
Glucose, UA: NEGATIVE mg/dL
Ketones, POC UA: NEGATIVE mg/dL
Nitrite, UA: POSITIVE — AB
POC PROTEIN,UA: NEGATIVE
Spec Grav, UA: 1.025 (ref 1.010–1.025)
Urobilinogen, UA: 0.2 E.U./dL
pH, UA: 5.5 (ref 5.0–8.0)

## 2022-07-30 MED ORDER — CIPROFLOXACIN HCL 250 MG PO TABS
250.0000 mg | ORAL_TABLET | Freq: Two times a day (BID) | ORAL | 0 refills | Status: AC
Start: 2022-07-30 — End: 2022-08-04

## 2022-07-30 MED ORDER — SEMAGLUTIDE-WEIGHT MANAGEMENT 1 MG/0.5ML ~~LOC~~ SOAJ
1.0000 mg | SUBCUTANEOUS | 0 refills | Status: AC
Start: 2022-07-30 — End: 2022-08-27

## 2022-07-30 MED ORDER — FLUTICASONE PROPIONATE 50 MCG/ACT NA SUSP: 2.0000 | Freq: Every day | NASAL | 3 refills | Status: DC

## 2022-07-30 NOTE — Patient Instructions (Signed)
It was a pleasure to see you today.  Thank you for giving [place] the opportunity to be involved in your care.  Below is a brief recap of your visit and next steps.  We will plan to see you again in August.  Summary Cipro prescribed for UTI today Flonase refilled Increase Ozempic to 1 mg weekly Follow up as scheduled in August

## 2022-07-30 NOTE — Addendum Note (Signed)
Addended by: Telford Nab on: 07/30/2022 04:49 PM   Modules accepted: Orders
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
## 2022-07-30 NOTE — Assessment & Plan Note (Signed)
He endorses a 4-day history of dark, discoloration of urine with foul odor, low back pain, dysuria, and increased urinary frequency.  Recently treated for UTI with Bactrim in late June.  POC UA today shows large leukocyte esterase, nitrite positive, and trace blood.  His most recent urine culture was polymicrobial. -Cipro 250 mg twice daily x 5 days prescribed for empiric treatment of UTI -Follow-up urine culture
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
## 2022-07-31 LAB — MICROSCOPIC EXAMINATION

## 2022-07-31 LAB — UA/M W/RFLX CULTURE, ROUTINE
Bilirubin, UA: NEGATIVE
Glucose, UA: NEGATIVE
Nitrite, UA: POSITIVE — AB
RBC, UA: NEGATIVE
Specific Gravity, UA: 1.019 (ref 1.005–1.030)
Urobilinogen, Ur: 1 mg/dL (ref 0.2–1.0)
pH, UA: 5.5 (ref 5.0–7.5)

## 2022-08-01 ENCOUNTER — Other Ambulatory Visit: Payer: Self-pay

## 2022-08-01 MED ORDER — SEMAGLUTIDE (1 MG/DOSE) 4 MG/3ML ~~LOC~~ SOPN: 1.0000 mg | PEN_INJECTOR | SUBCUTANEOUS | 0 refills | Status: DC

## 2022-08-05 DIAGNOSIS — E119 Type 2 diabetes mellitus without complications: Secondary | ICD-10-CM | POA: Diagnosis not present

## 2022-08-05 DIAGNOSIS — H524 Presbyopia: Secondary | ICD-10-CM | POA: Diagnosis not present

## 2022-08-12 ENCOUNTER — Other Ambulatory Visit: Payer: Self-pay | Admitting: Nurse Practitioner

## 2022-08-12 DIAGNOSIS — G4733 Obstructive sleep apnea (adult) (pediatric): Secondary | ICD-10-CM | POA: Diagnosis not present

## 2022-08-13 ENCOUNTER — Other Ambulatory Visit (HOSPITAL_COMMUNITY): Payer: 59

## 2022-08-14 DIAGNOSIS — G8250 Quadriplegia, unspecified: Secondary | ICD-10-CM | POA: Diagnosis not present

## 2022-08-14 DIAGNOSIS — M6281 Muscle weakness (generalized): Secondary | ICD-10-CM | POA: Diagnosis not present

## 2022-08-14 DIAGNOSIS — J449 Chronic obstructive pulmonary disease, unspecified: Secondary | ICD-10-CM | POA: Diagnosis not present

## 2022-08-15 ENCOUNTER — Telehealth: Payer: 59 | Admitting: Nurse Practitioner

## 2022-08-15 ENCOUNTER — Telehealth: Payer: 59 | Admitting: Physician Assistant

## 2022-08-15 DIAGNOSIS — J069 Acute upper respiratory infection, unspecified: Secondary | ICD-10-CM

## 2022-08-15 MED ORDER — AZELASTINE HCL 0.1 % NA SOLN
1.0000 | Freq: Two times a day (BID) | NASAL | 0 refills | Status: AC
Start: 2022-08-15 — End: ?

## 2022-08-15 MED ORDER — BENZONATATE 100 MG PO CAPS: 100.0000 mg | ORAL_CAPSULE | Freq: Three times a day (TID) | ORAL | 0 refills | Status: DC | PRN

## 2022-08-15 MED ORDER — GUAIFENESIN ER 600 MG PO TB12: 1200.0000 mg | ORAL_TABLET | Freq: Two times a day (BID) | ORAL | 0 refills | Status: AC | PRN

## 2022-08-15 MED ORDER — ALBUTEROL SULFATE (2.5 MG/3ML) 0.083% IN NEBU: 2.5000 mg | INHALATION_SOLUTION | Freq: Four times a day (QID) | RESPIRATORY_TRACT | 1 refills | Status: DC | PRN
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
## 2022-08-15 NOTE — Progress Notes (Signed)
E-Visit for Upper Respiratory Infection   We are sorry you are not feeling well.  Here is how we plan to help!  Based on what you have shared with me, it looks like you may have a viral upper respiratory infection.  Upper respiratory infections are caused by a large number of viruses; however, rhinovirus is the most common cause.   Symptoms vary from person to person, with common symptoms including sore throat, cough, fatigue or lack of energy and feeling of general discomfort.  A low-grade fever of up to 100.4 may present, but is often uncommon.  Symptoms vary however, and are closely related to a person's age or underlying illnesses.  The most common symptoms associated with an upper respiratory infection are nasal discharge or congestion, cough, sneezing, headache and pressure in the ears and face.  These symptoms usually persist for about 3 to 10 days, but can last up to 2 weeks.  It is important to know that upper respiratory infections do not cause serious illness or complications in most cases.    Upper respiratory infections can be transmitted from person to person, with the most common method of transmission being a person's hands.  The virus is able to live on the skin and can infect other persons for up to 2 hours after direct contact.  Also, these can be transmitted when someone coughs or sneezes; thus, it is important to cover the mouth to reduce this risk.  To keep the spread of the illness at bay, good hand hygiene is very important.  This is an infection that is most likely caused by a virus. There are no specific treatments other than to help you with the symptoms until the infection runs its course.  We are sorry you are not feeling well.  Here is how we plan to help!   For nasal congestion, you may use an oral decongestants such as Mucinex D or if you have glaucoma or high blood pressure use plain Mucinex.  Saline nasal spray or nasal drops can help and can safely be used as often as  needed for congestion.  For your congestion, I have prescribed Azelastine nasal spray two sprays in each nostril twice a day  If you do not have a history of heart disease, hypertension, diabetes or thyroid disease, prostate/bladder issues or glaucoma, you may also use Sudafed to treat nasal congestion.  It is highly recommended that you consult with a pharmacist or your primary care physician to ensure this medication is safe for you to take.     If you have a cough, you may use cough suppressants such as Delsym and Robitussin.  If you have glaucoma or high blood pressure, you can also use Coricidin HBP.   For cough I have prescribed for you A prescription cough medication called Tessalon Perles 100 mg. You may take 1-2 capsules every 8 hours as needed for cough  If you have a sore or scratchy throat, use a saltwater gargle-  to  teaspoon of salt dissolved in a 4-ounce to 8-ounce glass of warm water.  Gargle the solution for approximately 15-30 seconds and then spit.  It is important not to swallow the solution.  You can also use throat lozenges/cough drops and Chloraseptic spray to help with throat pain or discomfort.  Warm or cold liquids can also be helpful in relieving throat pain.  For headache, pain or general discomfort, you can use Ibuprofen or Tylenol as directed.   Some authorities believe   that zinc sprays or the use of Echinacea may shorten the course of your symptoms.   HOME CARE Only take medications as instructed by your medical team. Be sure to drink plenty of fluids. Water is fine as well as fruit juices, sodas and electrolyte beverages. You may want to stay away from caffeine or alcohol. If you are nauseated, try taking small sips of liquids. How do you know if you are getting enough fluid? Your urine should be a pale yellow or almost colorless. Get rest. Taking a steamy shower or using a humidifier may help nasal congestion and ease sore throat pain. You can place a towel over  your head and breathe in the steam from hot water coming from a faucet. Using a saline nasal spray works much the same way. Cough drops, hard candies and sore throat lozenges may ease your cough. Avoid close contacts especially the very young and the elderly Cover your mouth if you cough or sneeze Always remember to wash your hands.   GET HELP RIGHT AWAY IF: You develop worsening fever. If your symptoms do not improve within 10 days You develop yellow or green discharge from your nose over 3 days. You have coughing fits You develop a severe head ache or visual changes. You develop shortness of breath, difficulty breathing or start having chest pain Your symptoms persist after you have completed your treatment plan  MAKE SURE YOU  Understand these instructions. Will watch your condition. Will get help right away if you are not doing well or get worse.  Thank you for choosing an e-visit.  Your e-visit answers were reviewed by a board certified advanced clinical practitioner to complete your personal care plan. Depending upon the condition, your plan could have included both over the counter or prescription medications.  Please review your pharmacy choice. Make sure the pharmacy is open so you can pick up prescription now. If there is a problem, you may contact your provider through MyChart messaging and have the prescription routed to another pharmacy.  Your safety is important to us. If you have drug allergies check your prescription carefully.   For the next 24 hours you can use MyChart to ask questions about today's visit, request a non-urgent call back, or ask for a work or school excuse. You will get an email in the next two days asking about your experience. I hope that your e-visit has been valuable and will speed your recovery.  I have spent 5 minutes in review of e-visit questionnaire, review and updating patient chart, medical decision making and response to patient.    Jennifer M Burnette, PA-C  

## 2022-08-20 ENCOUNTER — Telehealth: Payer: 59 | Admitting: Physician Assistant

## 2022-08-20 DIAGNOSIS — B9689 Other specified bacterial agents as the cause of diseases classified elsewhere: Secondary | ICD-10-CM

## 2022-08-20 DIAGNOSIS — J208 Acute bronchitis due to other specified organisms: Secondary | ICD-10-CM | POA: Diagnosis not present

## 2022-08-20 MED ORDER — AZITHROMYCIN 250 MG PO TABS
ORAL_TABLET | ORAL | 0 refills | Status: AC
Start: 2022-08-20 — End: 2022-08-25

## 2022-08-20 MED ORDER — PREDNISONE 20 MG PO TABS: 40.0000 mg | ORAL_TABLET | Freq: Every day | ORAL | 0 refills | Status: DC
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
## 2022-08-20 NOTE — Patient Instructions (Signed)
Jordan Jensen, thank you for joining Margaretann Loveless, PA-C for today's virtual visit.  While this provider is not your primary care provider (PCP), if your PCP is located in our provider database this encounter information will be shared with them immediately following your visit.   A [facility] MyChart account gives you access to today's visit and all your visits, tests, and labs performed at [facility] " click here if you don't have a [facility] MyChart account or go to [email or web address]  Consent: (Patient) Jordan Jensen provided verbal consent for this virtual visit at the beginning of the encounter.  Current Medications:  Current Outpatient Medications:    azithromycin (ZITHROMAX) 250 MG tablet, Take 2 tablets on day 1, then 1 tablet daily on days 2 through 5, Disp: 6 tablet, Rfl: 0   predniSONE (DELTASONE) 20 MG tablet, Take 2 tablets (40 mg total) by mouth daily with breakfast., Disp: 10 tablet, Rfl: 0   acetaminophen (TYLENOL) 500 MG tablet, Take 500 mg by mouth every 6 (six) hours as needed for moderate pain., Disp: , Rfl:    albuterol (PROVENTIL) (2.5 MG/3ML) 0.083% nebulizer solution, Take 3 mLs (2.5 mg total) by nebulization every 6 (six) hours as needed for wheezing or shortness of breath., Disp: 360 mL, Rfl: 5   albuterol (PROVENTIL) (2.5 MG/3ML) 0.083% nebulizer solution, Take 3 mLs (2.5 mg total) by nebulization every 6 (six) hours as needed for wheezing or shortness of breath., Disp: 150 mL, Rfl: 1   albuterol (VENTOLIN HFA) 108 (90 Base) MCG/ACT inhaler, Inhale 2 puffs into the lungs every 6 (six) hours as needed for wheezing or shortness of breath., Disp: 1 each, Rfl: 5   alclomethasone (ACLOVATE) 0.05 % cream, Apply topically 2 (two) times daily as needed., Disp: , Rfl:    alfuzosin (UROXATRAL) 10 MG 24 hr tablet, Take 1 tablet (10 mg total) by mouth at bedtime., Disp: 30 tablet, Rfl: 11   azelastine (ASTELIN) 0.1 % nasal spray, Place 1 spray into  both nostrils 2 (two) times daily. Use in each nostril as directed, Disp: 30 mL, Rfl: 0   benzonatate (TESSALON) 100 MG capsule, Take 1 capsule (100 mg total) by mouth 3 (three) times daily as needed., Disp: 30 capsule, Rfl: 0   benzonatate (TESSALON) 100 MG capsule, Take 1 capsule (100 mg total) by mouth 3 (three) times daily as needed., Disp: 30 capsule, Rfl: 0   budesonide-formoterol (SYMBICORT) 160-4.5 MCG/ACT inhaler, Inhale 2 puffs into the lungs in the morning and at bedtime., Disp: 1 each, Rfl: 3   cetirizine (ZYRTEC) 10 MG tablet, Take 10 mg by mouth daily as needed for allergies., Disp: , Rfl:    diclofenac Sodium (VOLTAREN) 1 % GEL, Apply 2 g topically as needed., Disp: , Rfl:    fluticasone (FLONASE) 50 MCG/ACT nasal spray, Place 2 sprays into both nostrils daily., Disp: 16 g, Rfl: 3   guaiFENesin (MUCINEX) 600 MG 12 hr tablet, Take 2 tablets (1,200 mg total) by mouth 2 (two) times daily as needed for up to 7 days for cough or to loosen phlegm., Disp: 14 tablet, Rfl: 0   losartan (COZAAR) 25 MG tablet, Take 1 tablet (25 mg total) by mouth daily., Disp: 90 tablet, Rfl: 3   metoprolol succinate (TOPROL XL) 25 MG 24 hr tablet, Take 0.5 tablets (12.5 mg total) by mouth daily., Disp: 50 tablet, Rfl: 3   montelukast (SINGULAIR) 10 MG tablet, Take 1 tablet (10 mg total) by mouth at bedtime.,  Disp: 30 tablet, Rfl: 3   potassium chloride SA (KLOR-CON M) 20 MEQ tablet, Take 1 tablet (20 mEq total) by mouth 2 (two) times daily., Disp: 60 tablet, Rfl: 3   rosuvastatin (CRESTOR) 20 MG tablet, Take 1 tablet by mouth once daily, Disp: 90 tablet, Rfl: 0   Semaglutide, 1 MG/DOSE, 4 MG/3ML SOPN, Inject 1 mg as directed once a week., Disp: 3 mL, Rfl: 0   Semaglutide-Weight Management 1 MG/0.5ML SOAJ, Inject 1 mg into the skin once a week for 28 days., Disp: 2 mL, Rfl: 0   spironolactone (ALDACTONE) 25 MG tablet, Take 1 tablet (25 mg total) by mouth daily., Disp: 90 tablet, Rfl: 3    sulfamethoxazole-trimethoprim (BACTRIM DS) 800-160 MG tablet, Take 1 tablet by mouth 2 (two) times daily., Disp: 10 tablet, Rfl: 0   torsemide (DEMADEX) 20 MG tablet, Take 20 mg by mouth 2 (two) times daily., Disp: , Rfl:    Medications ordered in this encounter:  Meds ordered this encounter  Medications   azithromycin (ZITHROMAX) 250 MG tablet    Sig: Take 2 tablets on day 1, then 1 tablet daily on days 2 through 5    Dispense:  6 tablet    Refill:  0    Order Specific Question:   Supervising Provider    Answer:   Merrilee Jansky [[id number]]   predniSONE (DELTASONE) 20 MG tablet    Sig: Take 2 tablets (40 mg total) by mouth daily with breakfast.    Dispense:  10 tablet    Refill:  0    Order Specific Question:   Supervising Provider    Answer:   Merrilee Jansky [id number]     *If you need refills on other medications prior to your next appointment, please contact your pharmacy*  Follow-Up: Call back or seek an in-person evaluation if the symptoms worsen or if the condition fails to improve as anticipated.   Virtual Care [phone]  Other Instructions Acute Bronchitis, Adult  Acute bronchitis is sudden inflammation of the main airways (bronchi) that come off the windpipe (trachea) in the lungs. The swelling causes the airways to get smaller and make more mucus than normal. This can make it hard to breathe and can cause coughing or noisy breathing (wheezing). Acute bronchitis may last several weeks. The cough may last longer. Allergies, asthma, and exposure to smoke may make the condition worse. What are the causes? This condition can be caused by germs and by substances that irritate the lungs, including: Cold and flu viruses. The most common cause of this condition is the virus that causes the common cold. Bacteria. This is less common. Breathing in substances that irritate the lungs, including: Smoke from cigarettes and other forms of tobacco. Dust and  pollen. Fumes from household cleaning products, gases, or burned fuel. Indoor or outdoor air pollution. What increases the risk? The following factors may make you more likely to develop this condition: A weak body's defense system, also called the immune system. A condition that affects your lungs and breathing, such as asthma. What are the signs or symptoms? Common symptoms of this condition include: Coughing. This may bring up clear, yellow, or green mucus from your lungs (sputum). Wheezing. Runny or stuffy nose. Having too much mucus in your lungs (chest congestion). Shortness of breath. Aches and pains, including sore throat or chest. How is this diagnosed? This condition is usually diagnosed based on: Your symptoms and medical history. A physical exam. You may  also have other tests, including tests to rule out other conditions, such as pneumonia. These tests include: A test of lung function. Test of a mucus sample to look for the presence of bacteria. Tests to check the oxygen level in your blood. Blood tests. Chest X-ray. How is this treated? Most cases of acute bronchitis clear up over time without treatment. Your health care provider may recommend: Drinking more fluids to help thin your mucus so it is easier to cough up. Taking inhaled medicine (inhaler) to improve air flow in and out of your lungs. Using a vaporizer or a humidifier. These are machines that add water to the air to help you breathe better. Taking a medicine that thins mucus and clears congestion (expectorant). Taking a medicine that prevents or stops coughing (cough suppressant). It is not common to take an antibiotic medicine for this condition. Follow these instructions at home:  Take over-the-counter and prescription medicines only as told by your health care provider. Use an inhaler, vaporizer, or humidifier as told by your health care provider. Take two teaspoons (10 mL) of honey at bedtime to lessen  coughing at night. Drink enough fluid to keep your urine pale yellow. Do not use any products that contain nicotine or tobacco. These products include cigarettes, chewing tobacco, and vaping devices, such as e-cigarettes. If you need help quitting, ask your health care provider. Get plenty of rest. Return to your normal activities as told by your health care provider. Ask your health care provider what activities are safe for you. Keep all follow-up visits. This is important. How is this prevented? To lower your risk of getting this condition again: Wash your hands often with soap and water for at least 20 seconds. If soap and water are not available, use hand sanitizer. Avoid contact with people who have cold symptoms. Try not to touch your mouth, nose, or eyes with your hands. Avoid breathing in smoke or chemical fumes. Breathing smoke or chemical fumes will make your condition worse. Get the flu shot every year. Contact a health care provider if: Your symptoms do not improve after 2 weeks. You have trouble coughing up the mucus. Your cough keeps you awake at night. You have a fever. Get help right away if you: Cough up blood. Feel pain in your chest. Have severe shortness of breath. Faint or keep feeling like you are going to faint. Have a severe headache. Have a fever or chills that get worse. These symptoms may represent a serious problem that is an emergency. Do not wait to see if the symptoms will go away. Get medical help right away. Call your local emergency services (911 in the [place] Do not drive yourself to the hospital. Summary Acute bronchitis is inflammation of the main airways (bronchi) that come off the windpipe (trachea) in the lungs. The swelling causes the airways to get smaller and make more mucus than normal. Drinking more fluids can help thin your mucus so it is easier to cough up. Take over-the-counter and prescription medicines only as told by your health care  provider. Do not use any products that contain nicotine or tobacco. These products include cigarettes, chewing tobacco, and vaping devices, such as e-cigarettes. If you need help quitting, ask your health care provider. Contact a health care provider if your symptoms do not improve after 2 weeks. This information is not intended to replace advice given to you by your health care provider. Make sure you discuss any questions you have with your  health care provider. Document Revised: 04/04/2021 Document Reviewed: 04/25/2020 Elsevier Patient Education  2024 [employer]    If you have been instructed to have an in-person evaluation today at a local Urgent Care facility, please use the link below. It will take you to a list of all of our available [facility], including address, phone number and hours of operation. Please do not delay care.  [facility]  If you or a family member do not have a primary care provider, use the link below to schedule a visit and establish care. When you choose a [facility] primary care physician or advanced practice provider, you gain a long-term partner in health. Find a Primary Care Provider  Learn more about [facility] in-office and virtual care options:  - Get Care Now

## 2022-08-21 ENCOUNTER — Institutional Professional Consult (permissible substitution): Payer: 59 | Admitting: Pulmonary Disease

## 2022-08-28 NOTE — Telephone Encounter (Signed)
Pt is scheduled 8/30

## 2022-08-29 ENCOUNTER — Other Ambulatory Visit: Payer: Self-pay | Admitting: Internal Medicine

## 2022-09-05 ENCOUNTER — Ambulatory Visit: Payer: 59 | Admitting: Internal Medicine
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
## 2022-09-12 ENCOUNTER — Ambulatory Visit (HOSPITAL_COMMUNITY)
Admission: RE | Admit: 2022-09-12 | Discharge: 2022-09-12 | Disposition: A | Discharge status: Home / Self Care | Payer: 59 | Source: Ambulatory Visit | Attending: Family Medicine | Admitting: Family Medicine

## 2022-09-12 ENCOUNTER — Encounter (HOSPITAL_COMMUNITY): Payer: Self-pay

## 2022-09-12 VITALS — BP 91/48 | HR 81

## 2022-09-12 DIAGNOSIS — I2510 Atherosclerotic heart disease of native coronary artery without angina pectoris: Secondary | ICD-10-CM | POA: Diagnosis not present

## 2022-09-12 DIAGNOSIS — I5022 Chronic systolic (congestive) heart failure: Secondary | ICD-10-CM | POA: Diagnosis not present

## 2022-09-12 DIAGNOSIS — N390 Urinary tract infection, site not specified: Secondary | ICD-10-CM | POA: Diagnosis not present

## 2022-09-12 DIAGNOSIS — I110 Hypertensive heart disease with heart failure: Secondary | ICD-10-CM | POA: Diagnosis not present

## 2022-09-12 DIAGNOSIS — I890 Lymphedema, not elsewhere classified: Secondary | ICD-10-CM | POA: Diagnosis not present

## 2022-09-12 DIAGNOSIS — G8220 Paraplegia, unspecified: Secondary | ICD-10-CM | POA: Diagnosis not present

## 2022-09-12 DIAGNOSIS — G4733 Obstructive sleep apnea (adult) (pediatric): Secondary | ICD-10-CM

## 2022-09-12 DIAGNOSIS — Z79899 Other long term (current) drug therapy: Secondary | ICD-10-CM | POA: Insufficient documentation

## 2022-09-12 DIAGNOSIS — I493 Ventricular premature depolarization: Secondary | ICD-10-CM

## 2022-09-12 DIAGNOSIS — Z86718 Personal history of other venous thrombosis and embolism: Secondary | ICD-10-CM | POA: Diagnosis not present

## 2022-09-12 LAB — BASIC METABOLIC PANEL
Anion gap: 11 (ref 5–15)
BUN: 10 mg/dL (ref 6–20)
CO2: 33 mmol/L — ABNORMAL HIGH (ref 22–32)
Calcium: 9.2 mg/dL (ref 8.9–10.3)
Chloride: 97 mmol/L — ABNORMAL LOW (ref 98–111)
Creatinine, Ser: 0.98 mg/dL (ref 0.61–1.24)
GFR, Estimated: >60 mL/min (ref >60)
Glucose, Bld: 85 mg/dL (ref 70–99)
Potassium: 4.1 mmol/L (ref 3.5–5.1)
Sodium: 141 mmol/L (ref 135–145)

## 2022-09-12 LAB — BRAIN NATRIURETIC PEPTIDE: B Natriuretic Peptide: 73.2 pg/mL (ref 0.0–100.0)

## 2022-09-12 NOTE — Patient Instructions (Addendum)
Thank you for coming in today  If you had labs drawn today, any labs that are abnormal the clinic will call you No news is good news  You have been referred to EP Clinic [phone], their office will call you for further appointment details   You have been referred back to wound care clinic for lymphedema, their office will call you for further appointment details    Medications: No changes  Follow up appointments:  Your physician recommends that you schedule a follow-up appointment in:  2 months with With Dr. Gasper Lloyd with echocardiogram  Your physician has requested that you have an echocardiogram. Echocardiography is a painless test that uses sound waves to create images of your heart. It provides your doctor with information about the size and shape of your heart and how well your heart's chambers and valves are working. This procedure takes approximately one hour. There are no restrictions for this procedure.      Do the following things EVERYDAY: Weigh yourself in the morning before breakfast. Write it down and keep it in a log. Take your medicines as prescribed Eat low salt foods--Limit salt (sodium) to 2000 mg per day.  Stay as active as you can everyday Limit all fluids for the day to less than 2 liters   At the [facility], you and your health needs are our priority. As part of our continuing mission to provide you with exceptional heart care, we have created designated Provider Care Teams. These Care Teams include your primary Cardiologist (physician) and Advanced Practice Providers (APPs- Physician Assistants and Nurse Practitioners) who all work together to provide you with the care you need, when you need it.   You may see any of the following providers on your designated Care Team at your next follow up: Dr Arvilla Meres Dr Marca Ancona Dr. Marcos Eke, NP Robbie Lis, [place] [facility] [place], [place] Brynda Peon, NP Karle Plumber, PharmD   Please be sure to bring in all your medications bottles to every appointment.    Thank you for choosing [facility]  If you have any questions or concerns before your next appointment please send [place] a message through [place] or call our office at [phone].    TO LEAVE A MESSAGE FOR THE NURSE SELECT OPTION 2, PLEASE LEAVE A MESSAGE INCLUDING: YOUR NAME DATE OF BIRTH CALL BACK NUMBER REASON FOR CALL**this is important as we prioritize the call backs  YOU WILL RECEIVE A CALL BACK THE SAME DAY AS LONG AS YOU CALL BEFORE 4:00 PM

## 2022-09-14 DIAGNOSIS — G8250 Quadriplegia, unspecified: Secondary | ICD-10-CM | POA: Diagnosis not present

## 2022-09-14 DIAGNOSIS — J449 Chronic obstructive pulmonary disease, unspecified: Secondary | ICD-10-CM | POA: Diagnosis not present

## 2022-09-14 DIAGNOSIS — M6281 Muscle weakness (generalized): Secondary | ICD-10-CM | POA: Diagnosis not present

## 2022-09-15 ENCOUNTER — Ambulatory Visit: Payer: 59 | Admitting: Urology

## 2022-09-15 ENCOUNTER — Telehealth (HOSPITAL_COMMUNITY): Payer: Self-pay | Admitting: Cardiology

## 2022-09-15 DIAGNOSIS — I890 Lymphedema, not elsewhere classified: Secondary | ICD-10-CM

## 2022-09-15 NOTE — Telephone Encounter (Signed)
[facility]  Wound Care Center unable to accept referral -pt reports he has no open wounds -pt originally referred for lymphedema   Order for lymphedema clinic with [facility] placed [facility])

## 2022-09-24 ENCOUNTER — Ambulatory Visit: Payer: 59 | Admitting: Occupational Therapy

## 2022-09-26 ENCOUNTER — Ambulatory Visit (INDEPENDENT_AMBULATORY_CARE_PROVIDER_SITE_OTHER): Payer: 59 | Admitting: Podiatry

## 2022-09-26 DIAGNOSIS — M79674 Pain in right toe(s): Secondary | ICD-10-CM

## 2022-09-26 DIAGNOSIS — B351 Tinea unguium: Secondary | ICD-10-CM | POA: Diagnosis not present

## 2022-09-26 DIAGNOSIS — E1142 Type 2 diabetes mellitus with diabetic polyneuropathy: Secondary | ICD-10-CM

## 2022-09-26 DIAGNOSIS — M79675 Pain in left toe(s): Secondary | ICD-10-CM

## 2022-09-26 NOTE — Progress Notes (Signed)
Subjective:  Patient ID: Jordan Jensen, male    DOB: [birth year],  MRN: [id number]  Chief Complaint  Patient presents with   Nail Problem    Diabetic Foot Care-nail trim     52 y.o. male presents with the above complaint. History confirmed with patient. Patient presenting with pain related to dystrophic thickened elongated nails. Patient is unable to trim own nails related to nail dystrophy and/or mobility issues. Patient does  have a history of T2DM.   Objective:  Physical Exam: warm, good capillary refill nail exam onychomycosis of the toenails, ingrown nail at left hallux, onycholysis, and dystrophic nails DP pulses palpable, PT pulses palpable, and protective sensation intact Left Foot:  Pain with palpation of nails due to elongation and dystrophic growth. Thickening of left hallux nail.  Right Foot: Pain with palpation of nails due to elongation and dystrophic growth.   Assessment:   1. Pain due to onychomycosis of toenails of both feet   2. DM type 2 with diabetic peripheral neuropathy (HCC)     Plan:  Patient was evaluated and treated and all questions answered.  #Onychomycosis with pain  -Nails palliatively debrided as below. -Educated on self-care  Procedure: Nail Debridement Rationale: Pain Type of Debridement: manual, sharp debridement. Instrumentation: Nail nipper, rotary burr. Number of Nails: 10  Return in about 3 months (around 12/26/2022).         Corinna Gab, DPM [facility] / [facility]

## 2022-09-28 NOTE — Patient Instructions (Signed)
Visit Information  Thank you for taking time to visit with me today. Please don't hesitate to contact me if I can be of assistance to you.   Following are the goals we discussed today:   Goals Addressed               This Visit's Progress     Patient Stated     COMPLETED: assistance with getting personal care services [facility]) (pt-stated)        Duplicate goals      Community resources Manage heart failure(RNCM)) (pt-stated)        Interventions Today    Flowsheet Row Most Recent Value  Chronic Disease   Chronic disease during today's visit Other  [personal care services, dental services, eye appointment]  General Interventions   General Interventions Discussed/Reviewed General Interventions Reviewed, Community Resources  Doctor Visits Discussed/Reviewed Doctor Visits Reviewed, Specialist            COMPLETED: Improved symptoms for UTI (THN) (pt-stated)        Duplicate goal closure      COMPLETED: Manage Congestive heart failure at home [facility]) (pt-stated)        Duplicate goal      COMPLETED: Medication management -side effects  (THN) (pt-stated)        Duplicate goal        Our next appointment is by telephone on 10/08/22 at 1045  Please call the care guide team at [phone] if you need to cancel or reschedule your appointment.   If you are experiencing a Mental Health or Behavioral Health Crisis or need someone to talk to, please call the Suicide and Crisis Lifeline: 988 call the [place] National Suicide Prevention Lifeline: 204-035-0168 or TTY: 346-854-3137 TTY 804-471-7796) to talk to a trained counselor call 1-800-273-TALK (toll free, 24 hour hotline) call the [facility]: [phone] call 911   Patient verbalizes understanding of instructions and care plan provided today and agrees to view in MyChart. Active MyChart status and patient understanding of how to access instructions and care plan via MyChart confirmed with patient.     The  patient has been provided with contact information for the care management team and has been advised to call with any health related questions or concerns.  Nevin Kozuch L. Noelle Penner, RN, BSN, CCM, Care Management Coordinator [phone]

## 2022-09-30 ENCOUNTER — Encounter: Payer: Self-pay | Admitting: Orthopedic Surgery

## 2022-09-30 ENCOUNTER — Ambulatory Visit: Payer: 59 | Admitting: Orthopedic Surgery

## 2022-09-30 DIAGNOSIS — G8929 Other chronic pain: Secondary | ICD-10-CM | POA: Diagnosis not present

## 2022-09-30 DIAGNOSIS — M25512 Pain in left shoulder: Secondary | ICD-10-CM | POA: Diagnosis not present

## 2022-09-30 NOTE — Patient Instructions (Signed)
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
## 2022-10-01 ENCOUNTER — Other Ambulatory Visit: Payer: Self-pay | Admitting: Internal Medicine

## 2022-10-03 ENCOUNTER — Ambulatory Visit: Payer: 59 | Admitting: Nurse Practitioner

## 2022-10-03 NOTE — Progress Notes (Deleted)
Cardiology Office Note:  .   Date:  10/03/2022  ID:  [name], DOB December 18, 1970, MRN [id number] PCP: Billie Lade, MD  [facility] Providers Cardiologist:  Donato Schultz, MD Advanced Heart Failure:  Dorthula Nettles, DO    History of Present Illness: .   Jordan Jensen is a 52 y.o. male with a PMH of HFrEF, HTN, HLD, chronic lymphedema, spinal cord injury (paraplegia), obesity, OSA on BiPAP, DVT (diagnosed 08/2021 - resolved), and GERD, who presents today for scheduled follow-up.  Admitted to [facility] in August 2023, echocardiogram at that time revealed EF 20 to 25%. Diagnosed with DVT during admission, completed Eliquis therapy, has resolved. Followed by HF Clinic.   Today he presents for follow-up.  Doing well from a cardiac perspective.  Shows me his log of HR, BP, and O2. BP overall well controlled. HR ranging 40's-50's. O2 sat averaging in low 90's. Denies any chest pain, shortness of breath, palpitations, syncope, presyncope, dizziness, orthopnea, PND, swelling or significant weight changes, acute bleeding, or claudication. Compliant with BiPAP usage at night. Compliant with his medications.   Studies Reviewed: Marland Kitchen       Preliminary monitor report 06/2022:  Patch Wear Time:  13 days and 20 hours (2024-05-17T16:17:27-0400 to 2024-05-31T13:03:54-0400)   Patient had a min HR of 24 bpm, max HR of 190 bpm, and avg HR of 83 bpm. Predominant underlying rhythm was Sinus Rhythm. First Degree AV Block was present. 10 Ventricular Tachycardia runs occurred, the run with the fastest interval lasting 5 beats with a  max rate of 190 bpm, the longest lasting 11.4 secs with an avg rate of 149 bpm. 1 Pause occurred lasting 3.2 secs (20 bpm). Some Pause(s) occurred due to Second Degree AV Block-Mobitz I. Second Degree AV Block-Mobitz I (Wenckebach) was present. Isolated  SVEs were rare (<1.0%), SVE Couplets were rare (<1.0%), and SVE Triplets were rare (<1.0%). Isolated VEs were frequent (18.4%,  [id number]), VE Couplets were rare (<1.0%, 6338), and VE Triplets were rare (<1.0%, 693). Ventricular Bigeminy and Trigeminy were  present.  Echo 04/2022: 1. Left ventricular ejection fraction, by estimation, is 20 to 25%. The  left ventricle has severely decreased function. The left ventricle  demonstrates global hypokinesis. Left ventricular diastolic parameters are  consistent with Grade I diastolic  dysfunction (impaired relaxation).   2. Right ventricular systolic function was not well visualized. The right  ventricular size is mildly enlarged. Tricuspid regurgitation signal is  inadequate for assessing PA pressure.   3. The mitral valve was not well visualized. No evidence of mitral valve  regurgitation. No evidence of mitral stenosis.   4. The aortic valve was not well visualized. Aortic valve regurgitation  is not visualized. No aortic stenosis is present.   5. Aortic dilatation noted. There is borderline dilatation of the aortic  root, measuring 38 mm. There is borderline dilatation of the ascending  aorta, measuring 38 mm.   Comparison(s): No significant change from prior study.  CCTA 03/2022: IMPRESSION: 1. Coronary calcium score of 529. This was 80 percentile for age and sex matched control.   2. Normal coronary origin with right dominance.   3. CAD-RADS 1. Minimal non-obstructive CAD (0-24%). Consider non-atherosclerotic causes of chest pain. Consider preventive therapy and risk factor modification.   The noncardiac portion of this study will be interpreted in separate report by the radiologist.   IMPRESSION: Bibasilar volume loss, consolidation or scarring which is similar to the prior study. Risk Assessment/Calculations:  The 10-year ASCVD risk score (Arnett DK, et al., 2019) is: 9.7%   Values used to calculate the score:     Age: 20 years     Sex: Male     Is Non-Hispanic African American: Yes     Diabetic: Yes     Tobacco smoker: No     Systolic Blood  Pressure: 91 mmHg     Is BP treated: Yes     HDL Cholesterol: 37 mg/dL     Total Cholesterol: 134 mg/dL  Physical Exam:   VS:  There were no vitals taken for this visit.   Wt Readings from Last 3 Encounters:  07/30/22 (!) 389 lb (176.4 kg)  05/30/22 (!) 389 lb (176.4 kg)  05/09/22 292 lb 4.8 oz (132.6 kg)    GEN: Morbidly obese, 52 year old male in no acute distress NECK: No JVD; No carotid bruits CARDIAC: S1/S2, irregular rhythm and slow rate, no murmurs, rubs, gallops RESPIRATORY:  Clear to auscultation without rales, wheezing or rhonchi  EXTREMITIES:  Generalized nonpitting edema and lymphedema noted to BLE; paraplegic  ASSESSMENT AND PLAN: .   HFrEF Stage C, NYHA class 3, difficult to ascertain d/t body habitus and paraplegia. Likely NICM as CCTA showed minimal CAD. Echo 04/2022 revealed EF 20-25%. Unable to obtain weight today. Denies any symptoms of acute decompensation. GDMT limited. Continue Torsemide, potassium, Aldactone, losartan, previously stopped Metoprolol d/t bradycardia. Not a good candidate for SGLT2i. Cannot uptitrate GDMT at this time - see #3 below. Low sodium diet, fluid restriction <2L, and daily weights encouraged. Educated to contact our office for increase in swelling, increasing SHOB, or weight gain of 2 lbs overnight or 5 lbs in one week. Continue to follow-up with HF clinic as scheduled and recommend considering genetic testing as heart disease runs in his family. Will limited Echo, will defer to HF clinic.    NSVT/VT, hx of PVC's, bradycardia, sinus pause, second degree AV block PVC's noted on previous ECG, and on tele during recent admit. Was noted to have 20 beat run of NSVT during past hospitalization. Asymptomatic. See preliminary monitor report noted above, concerning for NSVT, VT, ventricular ectopy/PVC's, sinus pauses (around 3 seconds), and second degree AV block.   Addendum: Have messaged DOD, Dr. Dina Rich. Recommended to defer EP referral to HF  clinic. Will cancel limited Echo and defer this to them at this time.    3. HTN Improved and overall stable readings since stopping Metoprolol. Continue current medication regimen. Discussed to continue to monitor and log BP at home at least 2 hours before and after medications and sitting for 5-10 minutes. Discussed when to notify office. Heart healthy diet encouraged. ED precautions discussed.   HLD Denies any issues. Continue rosuvastatin. At next visit, consider obtaining/requesting labs. Heart healthy diet encouraged. Continue to follow with PCP.   Lymphedema Continue compression sleeves and leg pumps at home.    Morbid obesity  Weight loss via diet encouraged. Discussed the impact being overweight would have on cardiovascular risk.   7. OSA Currently on Bipap. Continue to follow-up with Pulmonary.    Dispo: Follow-up with me or APP in 2-3 months or sooner if anything changes.   Signed, Sharlene Dory, NP

## 2022-10-07 ENCOUNTER — Telehealth: Payer: Self-pay | Admitting: Orthopedic Surgery

## 2022-10-07 DIAGNOSIS — G4733 Obstructive sleep apnea (adult) (pediatric): Secondary | ICD-10-CM | POA: Diagnosis not present

## 2022-10-07 NOTE — Telephone Encounter (Signed)
Received call from patient requesting copy of his medical records. He went to the [place] office where he was given my phone number. Due to inconvenience , as he wasn't given auth to complete, I accepted verbal auth. Copy of records mailed to patient.

## 2022-10-08 ENCOUNTER — Ambulatory Visit: Payer: Self-pay | Admitting: *Deleted

## 2022-10-08 NOTE — Patient Instructions (Signed)
Visit Information  Thank you for taking time to visit with me today. Please don't hesitate to contact me if I can be of assistance to you.   Following are the goals we discussed today:   Goals Addressed               This Visit's Progress     Patient Stated     [employer] (wheel chair accessible dentist) Manage heart failure(RNCM) (pt-stated)   Not on track     Interventions Today    Flowsheet Row Most Recent Value  Chronic Disease   Chronic disease during today's visit Other  [dental, eye, powered wheelchair providers- status of services Confirmed he was seen at [facility] in [place] but still pending dentist services and powered wheelchair]  General Interventions   General Interventions Discussed/Reviewed --  [various calls with patient to [employer], [employer], Chartered loss adjuster, updated him on the message left at [employer] in [place]]  Doctor Visits Discussed/Reviewed Doctor Visits Reviewed, PCP, Database administrator (DME) Other  Tildon Husky wheelchair]  PCP/Specialist Visits Compliance with follow-up visit  Communication with PCP/Specialists  Merchant navy officer to [facility] [place], [place], [employer], [place] den]  Exercise Interventions   Exercise Discussed/Reviewed Exercise Reviewed, Physical Activity, Weight Managment, Assistive device use and maintanence  Physical Activity Discussed/Reviewed Physical Activity Reviewed, Types of exercise  [confirmed he is not going to pool therapy]  Weight Management Weight loss  [He is on and tolerating Ozempic]  Education Interventions   Education Provided Provided Education  [process of preauthorization]  Provided Verbal Education On --  Merchant navy officer to [employer] customer service ([name]) she viewed authorization Reference#[id number] Confirmed [employer] was the vendor. [name] confirmed the motorized wheelchair was not  approved(not part of his benefit)]  Mental Health Interventions   Mental Health Discussed/Reviewed Mental Health Discussed, Coping Strategies  Nutrition Interventions   Nutrition Discussed/Reviewed Nutrition Discussed, Fluid intake  Pharmacy Interventions   Pharmacy Dicussed/Reviewed Pharmacy Topics Discussed, Medications and their functions, Affording Medications  Safety Interventions   Safety Discussed/Reviewed Safety Reviewed, Home Safety  Home Safety Assistive Devices              Our next appointment is by telephone on 10/22/22 at 2:30 pm  Please call the care guide team at [phone] if you need to cancel or reschedule your appointment.   If you are experiencing a Mental Health or Behavioral Health Crisis or need someone to talk to, please call the Suicide and Crisis Lifeline: 988 call the [place] National Suicide Prevention Lifeline: (832)095-3075 or TTY: 929-615-4952 TTY (580)351-0978) to talk to a trained counselor call 1-800-273-TALK (toll free, 24 hour hotline) call the [facility]: [phone] call 911   Patient verbalizes understanding of instructions and care plan provided today and agrees to view in MyChart. Active MyChart status and patient understanding of how to access instructions and care plan via MyChart confirmed with patient.     The patient has been provided with contact information for the care management team and has been advised to call with any health related questions or concerns.   Kazi Montoro L. Noelle Penner, RN, BSN, CCM, Care Management Coordinator [phone]

## 2022-10-08 NOTE — Patient Outreach (Signed)
Care Coordination   Follow Up Visit Note   10/08/2022 Name: Jordan Jensen MRN: [id number] DOB: [birth year]  Jordan Jensen is a 52 y.o. year old male who sees Jordan Jensen, Jordan Mellow, MD for primary care. I spoke with  Jordan Jensen by phone today.  What matters to the patients health and wellness today?  [facility] Jordan Jensen was used and wheelchair accessible to get an exam, new glasses not needed   Dental services have not been completed as there has not been one that is accessible via his wheelchair. It is very tasking for him to gt out of his wheelchair. Conference calls were placed today to attempt to find a provider. He voiced understanding that there is a pending call from [facility] [place].   Power wheelchair- [place] [facility] ordered him a new wheelchair, it has not been obtained  Systems analyst -still not ordered, he has follow up with [employer] who informed him the durable medical equipment (DME) company  is waiting on the insurance coverage approval. He last spoke with an [employer] staff about 1-2 weeks ago. He was informed that [employer] brought [employer]. Patient reports he has been patiently awaiting this DME.   On Ozempic, weight loss medicines   Goals Addressed               This Visit's Progress     Patient Stated     [employer] (wheel chair accessible dentist) Manage heart failure(RNCM) (pt-stated)   Not on track     Interventions Today    Flowsheet Row Most Recent Value  Chronic Disease   Chronic disease during today's visit Other  [dental, eye, powered wheelchair providers- status of services Confirmed he was seen at [facility] in [place] but still pending dentist services and powered wheelchair]  General Interventions   General Interventions Discussed/Reviewed --  [various calls with patient to [employer], [employer], Chartered loss adjuster, updated him on the message left at  [employer] in [place]]  Doctor Visits Discussed/Reviewed Doctor Visits Reviewed, PCP, Database administrator (DME) Other  Jordan Jensen wheelchair]  PCP/Specialist Visits Compliance with follow-up visit  Communication with PCP/Specialists  Merchant navy officer to [facility] [place], [place], [employer], [place] den]  Exercise Interventions   Exercise Discussed/Reviewed Exercise Reviewed, Physical Activity, Weight Managment, Assistive device use and maintanence  Physical Activity Discussed/Reviewed Physical Activity Reviewed, Types of exercise  [confirmed he is not going to pool therapy]  Weight Management Weight loss  [He is on and tolerating Ozempic]  Education Interventions   Education Provided Provided Education  [process of preauthorization]  Provided Verbal Education On --  Merchant navy officer to united healthcare customer service ([name]) she viewed authorization Reference#[id number] Confirmed [employer] was the vendor. [name] confirmed the motorized wheelchair was not approved(not part of his benefit)]  Mental Health Interventions   Mental Health Discussed/Reviewed Mental Health Discussed, Coping Strategies  Nutrition Interventions   Nutrition Discussed/Reviewed Nutrition Discussed, Fluid intake  Pharmacy Interventions   Pharmacy Dicussed/Reviewed Pharmacy Topics Discussed, Medications and their functions, Affording Medications  Safety Interventions   Safety Discussed/Reviewed Safety Reviewed, Home Safety  Home Safety Assistive Devices              SDOH assessments and interventions completed:  No     Care Coordination Interventions:  Yes, provided   Follow up plan: Follow up call scheduled for 10/22/22    Encounter Outcome:  Patient Visit  Completed   Genecis Veley L. Noelle Penner, RN, BSN, CCM, Care Management Coordinator [phone]

## 2022-10-09 ENCOUNTER — Ambulatory Visit: Payer: 59 | Admitting: *Deleted

## 2022-10-09 ENCOUNTER — Telehealth: Payer: Self-pay

## 2022-10-09 ENCOUNTER — Ambulatory Visit: Payer: Self-pay | Admitting: *Deleted

## 2022-10-09 DIAGNOSIS — M5126 Other intervertebral disc displacement, lumbar region: Secondary | ICD-10-CM

## 2022-10-09 DIAGNOSIS — G9589 Other specified diseases of spinal cord: Secondary | ICD-10-CM

## 2022-10-09 DIAGNOSIS — I890 Lymphedema, not elsewhere classified: Secondary | ICD-10-CM

## 2022-10-09 DIAGNOSIS — G8220 Paraplegia, unspecified: Secondary | ICD-10-CM

## 2022-10-09 DIAGNOSIS — I10 Essential (primary) hypertension: Secondary | ICD-10-CM

## 2022-10-09 NOTE — Telephone Encounter (Signed)
   Telephone encounter was:  Successful.  10/09/2022 Name: Jordan Jensen MRN: [id number] DOB: Oct 19, 1970  [name] is a 52 y.o. year old male who is a primary care patient of Billie Lade, MD . The community resource team was consulted for assistance with  Dental  Care guide performed the following interventions: Patient provided with information about care guide support team and interviewed to confirm resource needs.Wheelchair accessible dentist in which patient would not have to get out of his wheelchair (this is tasking as he id 5'11" & 389 lbs) Lives in [place] willing to be seen in [place], [place] if none in [place].   Follow Up Plan:  No further follow up planned at this time. The patient has been provided with needed resources.    [name]  [facility], [facility] Guide, Phone: [phone] Website: [email or web address]

## 2022-10-09 NOTE — Patient Instructions (Signed)
Visit Information  Thank you for taking time to visit with me today. Please don't hesitate to contact me if I can be of assistance to you.   Following are the goals we discussed today:   Goals Addressed               This Visit's Progress     Patient Stated     [employer] (wheel chair accessible dentist) Manage heart failure(RNCM) (pt-stated)   Not on track     Interventions Today    Flowsheet Row Most Recent Value  Chronic Disease   Chronic disease during today's visit Other  [follow up status of power wheel chair spoke with staff at [employer] Anesha, Left message for sara related to Reference # [id number] provided by [facility] on 10/08/22- to see if same # shown to be not approved per [name]]  General Interventions   General Interventions Discussed/Reviewed General Interventions Reviewed, Communication with, Community Resources  PCP/Specialist Visits --  [referral to care guide for possible more resources for dental wheelchair accessible providers - to SW for assist with home aide]  Communication with --  [received a return call from sara [phone] to update that the claim with [employer] was the [id number] reference # (the claim had not been approved) Still pending response for ref #[id number]]              Our next appointment is by telephone on 10/22/22 at 2:30 pm  Please call the care guide team at [phone] if you need to cancel or reschedule your appointment.   If you are experiencing a Mental Health or Behavioral Health Crisis or need someone to talk to, please call the Suicide and Crisis Lifeline: 988 call the [place] National Suicide Prevention Lifeline: 435-164-4853 or TTY: 531-133-1419 TTY 351-146-9482) to talk to a trained counselor call 1-800-273-TALK (toll free, 24 hour hotline) call the [facility]: [phone] call 911   Patient verbalizes understanding of instructions and care plan provided today and agrees to view  in MyChart. Active MyChart status and patient understanding of how to access instructions and care plan via MyChart confirmed with patient.     The patient has been provided with contact information for the care management team and has been advised to call with any health related questions or concerns.   Sherrine Salberg L. Noelle Penner, RN, BSN, CCM, Care Management Coordinator [phone]

## 2022-10-09 NOTE — Patient Outreach (Signed)
  Care Coordination   10/09/2022  Name: Jordan Jensen MRN: [id number] DOB: 03/22/1970   Care Coordination Outreach Attempts:  An unsuccessful telephone outreach was attempted today to offer the patient information about available care coordination services. HIPAA compliant messages left on voicemail, providing contact information for CSW, encouraging patient to return CSW's call at his earliest convenience.  Follow Up Plan:  Additional outreach attempts will be made to offer the patient care coordination information and services.   Encounter Outcome:  No Answer.   Care Coordination Interventions:  No, not indicated.    Danford Bad, BSW, MSW, Printmaker Social Work Case Set designer Health  [facility], Population Health Direct Dial: [phone]  Fax: [phone] Email: [email or web address] Website: [email or web address]

## 2022-10-09 NOTE — Patient Outreach (Addendum)
  Care Coordination   Collaboration with DME company  Visit Note   10/09/2022 Name: Jordan Jensen MRN: [id number] DOB: 1970-02-17  Jordan Jensen is a 52 y.o. year old male who sees Durwin Nora, Lucina Mellow, MD for primary care. I  spoke with anesha and left message for Huntley Dec at [facility] [employer] mobility [phone]  What matters to the patients health and wellness today?  Powered wheelchair    Goals Addressed               This Visit's Progress     Patient Stated     [facility] (wheel chair accessible dentist) Manage heart failure(RNCM) (pt-stated)   Not on track     Interventions Today    Flowsheet Row Most Recent Value  Chronic Disease   Chronic disease during today's visit Other  [follow up status of power wheel chair spoke with staff at [employer], Left message for sara related to Reference # [id number] provided by [facility] on 10/08/22- to see if same # shown to be not approved per [name]]  General Interventions   General Interventions Discussed/Reviewed General Interventions Reviewed, Communication with, Community Resources  PCP/Specialist Visits --  [referral to care guide for possible more resources for dental wheelchair accessible providers - to SW for assist with home aide]  Communication with --  [received a return call from sara [phone] to update that the claim with [employer] was the [id number] reference # (the claim had not been approved) Still pending response for ref #[id number]]              SDOH assessments and interventions completed:  No     Care Coordination Interventions:  Yes, provided   Follow up plan: Follow up call scheduled for 10/22/22    Encounter Outcome:  Patient Visit Completed   .Adalay Azucena L. Noelle Penner, RN, BSN, CCM, Care Management Coordinator [phone]

## 2022-10-10 ENCOUNTER — Ambulatory Visit: Payer: 59 | Attending: Cardiology | Admitting: Occupational Therapy

## 2022-10-10 ENCOUNTER — Encounter: Payer: Self-pay | Admitting: Occupational Therapy

## 2022-10-10 ENCOUNTER — Ambulatory Visit: Payer: 59 | Admitting: Physician Assistant

## 2022-10-10 DIAGNOSIS — I890 Lymphedema, not elsewhere classified: Secondary | ICD-10-CM | POA: Diagnosis not present
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
## 2022-10-14 ENCOUNTER — Telehealth: Payer: Self-pay | Admitting: *Deleted

## 2022-10-14 DIAGNOSIS — G8250 Quadriplegia, unspecified: Secondary | ICD-10-CM | POA: Diagnosis not present

## 2022-10-14 DIAGNOSIS — M6281 Muscle weakness (generalized): Secondary | ICD-10-CM | POA: Diagnosis not present

## 2022-10-14 DIAGNOSIS — J449 Chronic obstructive pulmonary disease, unspecified: Secondary | ICD-10-CM | POA: Diagnosis not present

## 2022-10-14 NOTE — Progress Notes (Signed)
  Care Coordination Note  10/14/2022 Name: DONAVIN AUDINO MRN: [id number] DOB: September 23, 1970  Rochel Brome Dacanay is a 52 y.o. year old male who is a primary care patient of Billie Lade, MD and is actively engaged with the care management team. I reached out to [name] by phone today to assist with scheduling a follow up visit with the Licensed Clinical Social Worker  Follow up plan: Unsuccessful telephone outreach attempt made. A HIPAA compliant phone message was left for the patient providing contact information and requesting a return call.   Cassandria Anger  Care Coordination Care Guide  Direct Dial: [phone]

## 2022-10-14 NOTE — Progress Notes (Signed)
  Care Coordination Note  10/14/2022 Name: Jordan Jensen MRN: [id number] DOB: October 01, 1970  [name] is a 52 y.o. year old male who is a primary care patient of Billie Lade, MD and is actively engaged with the care management team. I reached out to [name] by phone today to assist with scheduling a follow up visit with the Licensed Clinical Social Worker  Follow up plan: Telephone appointment with care management team member scheduled for:10/17/22  [facility] Coordination Care Guide  Direct Dial: [phone]

## 2022-10-15 ENCOUNTER — Ambulatory Visit: Payer: Self-pay | Admitting: *Deleted

## 2022-10-15 NOTE — Patient Instructions (Addendum)
Visit Information  Thank you for taking time to visit with me today. Please don't hesitate to contact me if I can be of assistance to you.   Following are the goals we discussed today:   Goals Addressed               This Visit's Progress     Patient Stated     [employer] (wheel chair accessible dentist) Manage heart failure(RNCM) (pt-stated)        Interventions Today    Flowsheet Row Most Recent Value  Chronic Disease   Chronic disease during today's visit Other  [Wheelchair bound dental services   It was confirmed that Mr Macgowan would have to be able to get out of his wheelchair into the dental chair for services at [facility]]  General Interventions   General Interventions Discussed/Reviewed General Interventions Reviewed, Communication with  Communication with PCP/Specialists  [call to [facility] [phone]]              Our next appointment is by telephone on 10/22/22 at 2:30 pm  Please call the care guide team at [phone] if you need to cancel or reschedule your appointment.   If you are experiencing a Mental Health or Behavioral Health Crisis or need someone to talk to, please call the Suicide and Crisis Lifeline: 988 call the [place] National Suicide Prevention Lifeline: 510-157-2880 or TTY: (804)499-1397 TTY (226) 304-9942) to talk to a trained counselor call 1-800-273-TALK (toll free, 24 hour hotline) call the [facility]: 8547134650 call 911   Patient verbalizes understanding of instructions and care plan provided today and agrees to view in MyChart. Active MyChart status and patient understanding of how to access instructions and care plan via MyChart confirmed with patient.     The patient has been provided with contact information for the care management team and has been advised to call with any health related questions or concerns.   Maytte Jacot L. Noelle Penner, RN, BSN, [facility]  VBCI Care Management  Coordinator  [phone]  Fax: [phone]

## 2022-10-15 NOTE — Patient Outreach (Signed)
  Care Coordination   Collaboration with dental suites   Visit Note   10/15/2022 Name: MARCELLINO FIDALGO MRN: [id number] DOB: [birth year]  Rochel Brome Gracey is a 52 y.o. year old male who sees Durwin Nora, Lucina Mellow, MD for primary care. I  spoke with Zanih at [facility] in [place] to follow up on wheelchair bound dental services  What matters to the patients health and wellness today?  Wheelchair bound dental services  It was confirmed that Mr Ambrocio would have to be able to get out of his wheelchair into the dental chair for services at [facility]    Goals Addressed               This Visit's Progress     Patient Stated     [facility] (wheel chair accessible dentist) Manage heart failure(RNCM) (pt-stated)        Interventions Today    Flowsheet Row Most Recent Value  Chronic Disease   Chronic disease during today's visit Other  [Wheelchair bound dental services   It was confirmed that Mr Dauphinais would have to be able to get out of his wheelchair into the dental chair for services at [facility]]  General Interventions   General Interventions Discussed/Reviewed General Interventions Reviewed, Communication with  Communication with PCP/Specialists  [call to [facility] [phone]]              SDOH assessments and interventions completed:  No     Care Coordination Interventions:  Yes, provided   Follow up plan: Follow up call scheduled for 10/22/22     Encounter Outcome:  Patient Visit Completed   [name], RN, BSN, [facility]  VBCI Care Management Coordinator  [phone]  Fax: [phone]

## 2022-10-17 ENCOUNTER — Telehealth: Payer: Self-pay

## 2022-10-17 ENCOUNTER — Ambulatory Visit: Payer: Self-pay | Admitting: *Deleted

## 2022-10-17 NOTE — Telephone Encounter (Signed)
   Telephone encounter was:  Successful.  10/17/2022 Name: Jordan Jensen MRN: [id number] DOB: 1970/10/10  Jordan Jensen is a 52 y.o. year old male who is a primary care patient of Billie Lade, MD . The community resource team was consulted for assistance with dental  Care guide performed the following interventions: Patient provided with information about care guide support team and interviewed to confirm resource needs.Following up with Patient about dental resources. I wasnt able to find any resources for dentist with a wheelchair tipper so this patient wouldnt have to get out of the wheelchair during his visit.   Follow Up Plan:  No further follow up planned at this time. The patient has been provided with needed resources.    Jordan Jensen  [facility], [facility] Guide, Phone: [phone] Website: [email or web address]

## 2022-10-17 NOTE — Telephone Encounter (Signed)
   Telephone encounter was:  Unsuccessful.  10/17/2022 Name: EDDRICK DILONE MRN: [id number] DOB: 12-18-1970  Unsuccessful outbound call made today to assist with:   Dental   Outreach Attempt:  2nd Attempt  A HIPAA compliant voice message was left requesting a return call.  Instructed patient to call back.    Lenard Forth [place]  [facility], [facility] Guide, Phone: [phone] Website: [email or web address]

## 2022-10-17 NOTE — Patient Outreach (Signed)
  Care Coordination   10/17/2022  Name: Jordan Jensen MRN: [id number] DOB: [birth year]   Care Coordination Outreach Attempts:  A second unsuccessful outreach was attempted today to offer the patient with information about available care coordination services. HIPAA compliant messages left on voicemail for patient, providing contact information for CSW, encouraging patient to return CSW's call at his earliest convenience.  Follow Up Plan:  Additional outreach attempts will be made to offer the patient care coordination information and services.   Encounter Outcome:  No Answer.   Care Coordination Interventions:  No, not indicated.    Danford Bad, BSW, MSW, Printmaker Social Work Case Set designer Health  [facility], Population Health Direct Dial: [phone]  Fax: [phone] Email: [email or web address]

## 2022-10-22 ENCOUNTER — Ambulatory Visit: Payer: Self-pay | Admitting: *Deleted

## 2022-10-22 NOTE — Patient Instructions (Addendum)
  Visit Information  [facility] - may call or text you about a scale Service/Rentals: [phone] Fax: [phone] [email or web address]  Thank you for taking time to visit with me today. Please don't hesitate to contact me if I can be of assistance to you.   Following are the goals we discussed today:   Goals Addressed               This Visit's Progress     Patient Stated     Pain management, weight management, Community resources (wheel chair accessible dentist), Manage heart failure(RNCM) (pt-stated)   Not on track     Interventions Today    Flowsheet Row Most Recent Value  Chronic Disease   Chronic disease during today's visit Other  [Wheelchair bound dental services, pain management, weight loss with Ozempic-Mounjaro]  General Interventions   General Interventions Discussed/Reviewed General Interventions Reviewed, Durable Medical Equipment (DME), Community Resources, Doctor Visits, Communication with  Doctor Visits Discussed/Reviewed Doctor Visits Reviewed, PCP, Database administrator (DME) Wheelchair  Wheelchair Standard  PCP/Specialist Visits Compliance with follow-up visit  Communication with PCP/Specialists, RN  [Spoke with [employer]- do not have large scales, Completed [facility] scales contact form for patient, Left a message for Dr Sueanne Margarita nurse [employer] request a return call -pain management plan]  Exercise Interventions   Exercise Discussed/Reviewed Exercise Reviewed, Weight Managment, Assistive device use and maintanence  [Discussed scale for weighing to manage Congestive heart failure weight gains. Discussed resistance weight loss]  Physical Activity Discussed/Reviewed Physical Activity Reviewed, Home Exercise Program (HEP)  Weight Management Weight loss  Education Interventions   Education Provided Provided Education  [sent a list of in network united healthcare neurosurgeons via email, Pain management clinics,]   Provided Verbal Education On [employer], Other  [Discussed & permission given to speak with pcp/specialist about changes in weight loss treatment]  Mental Health Interventions   Mental Health Discussed/Reviewed Mental Health Reviewed, Coping Strategies               Our next appointment is by telephone on 11/04/22 at 2:30 pm  Please call the care guide team at [phone] if you need to cancel or reschedule your appointment.   If you are experiencing a Mental Health or Behavioral Health Crisis or need someone to talk to, please call the Suicide and Crisis Lifeline: 988 call the [place] National Suicide Prevention Lifeline: (817)863-9661 or TTY: (510)829-2603 TTY 870-057-8351) to talk to a trained counselor call 1-800-273-TALK (toll free, 24 hour hotline) call the [facility]: 909-528-9541 call 911   Patient verbalizes understanding of instructions and care plan provided today and agrees to view in MyChart. Active MyChart status and patient understanding of how to access instructions and care plan via MyChart confirmed with patient.     The patient has been provided with contact information for the care management team and has been advised to call with any health related questions or concerns.   Kellene Mccleary L. Noelle Penner, RN, BSN, [facility]  VBCI Care Management Coordinator  [phone]  Fax: [phone]

## 2022-10-22 NOTE — Patient Outreach (Signed)
  Care Coordination   Follow Up Visit Note   10/22/2022 Name: Jordan Jensen MRN: [id number] DOB: [birth year]  Jordan Jensen is a 52 y.o. year old male who sees Durwin Nora, Lucina Mellow, MD for primary care. I spoke with  [name] by phone today.  What matters to the patients health and wellness today?   Bulging Disc in his back (followed by [name], [facility], too overweight + CHF risk preventing surgery  Patient wants better pain management if surgery remains a risk  Dentists, wheelchair scales, motorized wheelchair, home care services reviewed  Patient to follow up with other referred staff  Still pending an outreach with care coordination social worker to discuss home care services  Recently got his flu shot - fet a little under the weather afterwards but feels better today  Patient has not have any further return calls from Dentists He will outreach to [employer] [employer] if needed  Voiced understanding of wheelchair scale cost + ability to get weight checked a pcp, specialist offices       Goals Addressed               This Visit's Progress     Patient Stated     Pain management, weight management, Community resources (wheel chair accessible dentist), Manage heart failure(RNCM) (pt-stated)   Not on track     Interventions Today    Flowsheet Row Most Recent Value  Chronic Disease   Chronic disease during today's visit Other  [Wheelchair bound dental services, pain management, weight loss with Ozempic-Mounjaro]  General Interventions   General Interventions Discussed/Reviewed General Interventions Reviewed, Durable Medical Equipment (DME), [facility], Doctor Visits, Communication with  Doctor Visits Discussed/Reviewed Doctor Visits Reviewed, PCP, Database administrator (DME) Wheelchair  Wheelchair Standard  PCP/Specialist Visits Compliance with follow-up visit  Communication with PCP/Specialists, RN  [Spoke with [employer]- do  not have large scales, Completed [facility] scales contact form for patient, Left a message for Dr Sueanne Margarita nurse [employer] request a return call -pain management plan]  Exercise Interventions   Exercise Discussed/Reviewed Exercise Reviewed, Weight Managment, Assistive device use and maintanence  [Discussed scale for weighing to manage Congestive heart failure weight gains. Discussed resistance weight loss]  Physical Activity Discussed/Reviewed Physical Activity Reviewed, Home Exercise Program (HEP)  Weight Management Weight loss  Education Interventions   Education Provided Provided Education  [sent a list of in network united healthcare neurosurgeons via email, Pain management clinics,]  Provided Verbal Education On [employer], Other  [Discussed & permission given to speak with pcp/specialist about changes in weight loss treatment]  Mental Health Interventions   Mental Health Discussed/Reviewed Mental Health Reviewed, Coping Strategies               SDOH assessments and interventions completed:  No     Care Coordination Interventions:  Yes, provided   Follow up plan: Follow up call scheduled for 11/04/22 2:30 pm    Encounter Outcome:  Patient Visit Completed   Timothee Gali L. Noelle Penner, RN, BSN, [facility]  VBCI Care Management Coordinator  [phone]  Fax: [phone]

## 2022-10-27 ENCOUNTER — Ambulatory Visit (INDEPENDENT_AMBULATORY_CARE_PROVIDER_SITE_OTHER): Payer: 59 | Admitting: Internal Medicine

## 2022-10-27 ENCOUNTER — Telehealth: Payer: Self-pay | Admitting: *Deleted

## 2022-10-27 ENCOUNTER — Encounter: Payer: Self-pay | Admitting: Internal Medicine

## 2022-10-27 VITALS — BP 109/65 | HR 84 | Ht 71.0 in | Wt 384.0 lb

## 2022-10-27 DIAGNOSIS — N390 Urinary tract infection, site not specified: Secondary | ICD-10-CM | POA: Insufficient documentation

## 2022-10-27 DIAGNOSIS — Z7985 Long-term (current) use of injectable non-insulin antidiabetic drugs: Secondary | ICD-10-CM

## 2022-10-27 DIAGNOSIS — R8290 Unspecified abnormal findings in urine: Secondary | ICD-10-CM | POA: Diagnosis not present

## 2022-10-27 DIAGNOSIS — E119 Type 2 diabetes mellitus without complications: Secondary | ICD-10-CM

## 2022-10-27 DIAGNOSIS — R3989 Other symptoms and signs involving the genitourinary system: Secondary | ICD-10-CM

## 2022-10-27 DIAGNOSIS — E7849 Other hyperlipidemia: Secondary | ICD-10-CM

## 2022-10-27 MED ORDER — SULFAMETHOXAZOLE-TRIMETHOPRIM 800-160 MG PO TABS
1.0000 | ORAL_TABLET | Freq: Two times a day (BID) | ORAL | 0 refills | Status: AC
Start: 2022-10-27 — End: 2022-11-03

## 2022-10-27 MED ORDER — OZEMPIC (1 MG/DOSE) 4 MG/3ML ~~LOC~~ SOPN
3.0000 mg | PEN_INJECTOR | SUBCUTANEOUS | 2 refills | Status: DC
Start: 2022-10-27 — End: 2023-04-16

## 2022-10-27 NOTE — Assessment & Plan Note (Signed)
His acute concern today is lumbar back pain, dark discoloration of urine, and foul odor of urine x 3 days.  He has a history of recurrent UTI.  He has most recently grown Klebsiella resistant to ampicillin and with intermediate susceptibility to nitrofurantoin. -Empirically treat for UTI with Bactrim DS x 7 days -UA and urine culture pending.

## 2022-10-27 NOTE — Assessment & Plan Note (Signed)
Lipid panel last updated in May 2023.  Total cholesterol 134 and LDL 72.  He is currently prescribed rosuvastatin 20 mg daily. -Repeat lipid panel ordered today.

## 2022-10-27 NOTE — Progress Notes (Unsigned)
  Care Coordination Note  10/27/2022 Name: HANEY SIMONET MRN: [id number] DOB: [birth year]  Rochel Brome Seaborn is a 52 y.o. year old male who is a primary care patient of Billie Lade, MD and is actively engaged with the care management team. I reached out to [name] by phone today to assist with re-scheduling a follow up visit with the Licensed Clinical Social Worker  Follow up plan: Unsuccessful telephone outreach attempt made. A HIPAA compliant phone message was left for the patient providing contact information and requesting a return call.   [facility]  Care Coordination Care Guide  Direct Dial: [phone]

## 2022-10-27 NOTE — Patient Instructions (Signed)
It was a pleasure to see you today.  Thank you for giving [place] the opportunity to be involved in your care.  Below is a brief recap of your visit and next steps.  We will plan to see you again in 6 months.  Summary Repeat labs ordered Empirically treat for UTI with Bactrim DS x 7 days Follow up in 6 months

## 2022-10-27 NOTE — Assessment & Plan Note (Signed)
A1c 6.7 in December 2023.  Currently prescribed Ozempic 1 mg weekly. -Repeat A1c ordered today -Diabetes related preventative care items are up-to-date
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
## 2022-10-28 NOTE — Progress Notes (Signed)
  Care Coordination Note  10/28/2022 Name: Jordan Jensen MRN: [id number] DOB: 08-23-1970  Jordan Jensen is a 52 y.o. year old male who is a primary care patient of Billie Lade, MD and is actively engaged with the care management team. I reached out to [name] by phone today to assist with re-scheduling a follow up visit with the Licensed Clinical Social Worker  Follow up plan: Telephone appointment with care management team member scheduled for:11/17/22  [facility]  Care Coordination Care Guide  Direct Dial: [phone]

## 2022-10-29 DIAGNOSIS — R8290 Unspecified abnormal findings in urine: Secondary | ICD-10-CM | POA: Diagnosis not present

## 2022-10-30 LAB — UA/M W/RFLX CULTURE, ROUTINE
Bilirubin, UA: NEGATIVE
Glucose, UA: NEGATIVE
Ketones, UA: NEGATIVE
Leukocytes,UA: NEGATIVE
Nitrite, UA: NEGATIVE
Protein,UA: NEGATIVE
RBC, UA: NEGATIVE
Specific Gravity, UA: 1.006 (ref 1.005–1.030)
Urobilinogen, Ur: 0.2 mg/dL (ref 0.2–1.0)
pH, UA: 6 (ref 5.0–7.5)

## 2022-10-30 LAB — MICROSCOPIC EXAMINATION
Casts: NONE SEEN /[LPF]
Epithelial Cells (non renal): NONE SEEN /[HPF] (ref 0–10)
RBC, Urine: NONE SEEN /[HPF] (ref 0–2)
WBC, UA: NONE SEEN /[HPF] (ref 0–5)

## 2022-11-04 ENCOUNTER — Ambulatory Visit: Payer: Self-pay | Admitting: *Deleted

## 2022-11-04 NOTE — Patient Outreach (Signed)
  Care Coordination   11/04/2022 Name: WYAT KOSICK MRN: [id number] DOB: 04/23/1970   Care Coordination Outreach Attempts:  An unsuccessful telephone outreach was attempted today to offer the patient information about available care coordination services.  Follow Up Plan:  Additional outreach attempts will be made to offer the patient care coordination information and services.   Encounter Outcome:  No Answer   Care Coordination Interventions:  No, not indicated    Jobie Popp L. Noelle Penner, RN, BSN, [facility]  VBCI Care Management Coordinator  [phone]  Fax: [phone]

## 2022-11-05 ENCOUNTER — Ambulatory Visit: Payer: Self-pay | Admitting: *Deleted

## 2022-11-05 ENCOUNTER — Other Ambulatory Visit: Payer: Self-pay | Admitting: Internal Medicine

## 2022-11-05 ENCOUNTER — Encounter: Payer: Self-pay | Admitting: Primary Care

## 2022-11-05 ENCOUNTER — Ambulatory Visit (INDEPENDENT_AMBULATORY_CARE_PROVIDER_SITE_OTHER): Payer: 59 | Admitting: Primary Care

## 2022-11-05 VITALS — BP 106/70 | HR 55

## 2022-11-05 DIAGNOSIS — G4733 Obstructive sleep apnea (adult) (pediatric): Secondary | ICD-10-CM

## 2022-11-05 DIAGNOSIS — E119 Type 2 diabetes mellitus without complications: Secondary | ICD-10-CM | POA: Diagnosis not present

## 2022-11-05 DIAGNOSIS — J4530 Mild persistent asthma, uncomplicated: Secondary | ICD-10-CM

## 2022-11-05 DIAGNOSIS — E7849 Other hyperlipidemia: Secondary | ICD-10-CM | POA: Diagnosis not present

## 2022-11-05 NOTE — Patient Instructions (Addendum)
Visit Information  Thank you for taking time to visit with me today. Please don't hesitate to contact me if I can be of assistance to you.   Following are the goals we discussed today:   Goals Addressed               This Visit's Progress     Patient Stated     Pain management, weight management, Community resources (wheel chair accessible dentist), Manage heart failure(RNCM) (pt-stated)   Not on track     Interventions Today    Flowsheet Row Most Recent Value  Chronic Disease   Chronic disease during today's visit Other  [motorize wheelchair authorization, weight managment, dental services, Encouraged outreach to Care coordination social worker Discussed next scheduled SW all on 11/17/22]  General Interventions   General Interventions Discussed/Reviewed General Interventions Reviewed, Horticulturist, commercial (DME), Communication with, Oceanographer (DME) Wheelchair  Wheelchair Motorized  [place] for status of obtaining motorized wheelchair and needs to complete delivery status, Discussed a reclining bariatric wheelchair if does not qualify for a motorized one via united healthcare medicare/medicaid]  Communication with PCP/Specialists, RN  [clinical pool outreach to inquire about MD receiving and completing authorizaton forms for motorized wheelchair, interest in referral for weight & wellness program, weight loss medicine change]  Exercise Interventions   Weight Management Weight loss  [encouraged]  Education Interventions   Education Provided Provided Education  [reclining standard wheelchair, [place] weight & wellness program, change of weight loss medicine,]  Provided Verbal Education On Nutrition, Medication, Development worker, community, [employer]  Mental Health Interventions   Mental Health Discussed/Reviewed Mental Health Reviewed, Coping Strategies  Nutrition Interventions   Nutrition Discussed/Reviewed Nutrition Reviewed, Portion  sizes, Increasing proteins, Decreasing fats  Pharmacy Interventions   Pharmacy Dicussed/Reviewed Pharmacy Topics Reviewed, Affording Medications               Our next appointment is by telephone on 11/19/22 at 1015  Please call the care guide team at [phone] if you need to cancel or reschedule your appointment.   If you are experiencing a Mental Health or Behavioral Health Crisis or need someone to talk to, please call the Suicide and Crisis Lifeline: 988 call the [place] National Suicide Prevention Lifeline: (505)119-3703 or TTY: 563-645-2180 TTY (671) 283-5137) to talk to a trained counselor call 1-800-273-TALK (toll free, 24 hour hotline) call the [facility]: [phone] call 911   Patient verbalizes understanding of instructions and care plan provided today and agrees to view in MyChart. Active MyChart status and patient understanding of how to access instructions and care plan via MyChart confirmed with patient.     The patient has been provided with contact information for the care management team and has been advised to call with any health related questions or concerns.   Shetara Launer L. Noelle Penner, RN, BSN, [facility]  VBCI Care Management Coordinator  [phone]  Fax: [phone]
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
## 2022-11-05 NOTE — Patient Outreach (Signed)
Care Coordination   Follow Up Visit Note   11/05/2022 Name: Jordan Jensen MRN: [id number] DOB: 10-02-1970  Jordan Jensen is a 52 y.o. year old male who sees Jordan Jensen, Jordan Mellow, MD for primary care. I spoke with  Jordan Jensen by phone today.  What matters to the patients health and wellness today?  Motorized Wheelchair- united healthcare not approving related to reclining option. Patient has docu-signed forms sent by the wheelchair clinic prior to his 10/27/22 pcp visit so forms can be sent to Jordan Jordan Jensen. Jordan Jordan Jensen  had not received the forms as of 10/27/22. Jordan Jordan Jensen needs to sign form and return. Jordan Jensen at [employer] at [phone] is patient's contact If he does not get approved for motorized wheelchair, he is interested in getting another standard bariatric wheelchair that reclines. His present wheelchair is over 54 years old   Wheel chair scales ($700+) Patient received an email form Jordan Jensen.  Spoke with someone but is not aware if his insurance will pay a portion of the cost of the scales   Will make dental appointment after 01/07/23 when his [facility] rides renew as he has been in touch with a friend at [facility] who confirms [facility] does have wheelchair accessible dentists   Has not received a return call from Jordan Jensen office. Cancelled a recent appointment with Jordan Jensen and has not rescheduled  He has received the list of provider sent by RN CM  Agrees to continue to work on his weight loss and to reschedule with a neurologist Voiced understanding of [facility] wellness program and interest in a referral   He needs to call care coordination SW Jordan Jensen    Goals Addressed               This Visit's Progress     Patient Stated     Pain management, weight management, Community resources (wheel chair accessible dentist), Manage heart failure(RNCM) (pt-stated)   Not on track     Interventions Today    Flowsheet Row Most Recent Value  Chronic Disease   Chronic disease during  today's visit Other  [motorize wheelchair authorization, weight managment, dental services, Encouraged outreach to Care coordination social worker Discussed next scheduled SW all on 11/17/22]  General Interventions   General Interventions Discussed/Reviewed General Interventions Reviewed, Horticulturist, commercial (DME), Communication with, Oceanographer (DME) Wheelchair  Wheelchair Motorized  [place] for status of obtaining motorized wheelchair and needs to complete delivery status, Discussed a reclining bariatric wheelchair if does not qualify for a motorized one via united healthcare medicare/medicaid]  Communication with PCP/Specialists, RN  [clinical pool outreach to inquire about MD receiving and completing authorizaton forms for motorized wheelchair, interest in referral for [name], weight loss medicine change]  Exercise Interventions   Weight Management Weight loss  [encouraged]  Education Interventions   Education Provided Provided Education  [reclining standard wheelchair, [facility], change of weight loss medicine,]  Provided Verbal Education On Nutrition, Medication, Development worker, community, Programmer, applications  Mental Health Interventions   Mental Health Discussed/Reviewed Mental Health Reviewed, Coping Strategies  Nutrition Interventions   Nutrition Discussed/Reviewed Nutrition Reviewed, Portion sizes, Increasing proteins, Decreasing fats  Pharmacy Interventions   Pharmacy Dicussed/Reviewed Pharmacy Topics Reviewed, Affording Medications               SDOH assessments and interventions completed:  No     Care Coordination Interventions:  Yes, provided   Follow up plan: Follow up call scheduled for 11/19/22    Encounter Outcome:  Patient Visit Completed   Jordan Bradford L. Noelle Penner, RN, BSN, [facility]  VBCI Care Management Coordinator  [phone]  Fax: [phone]

## 2022-11-05 NOTE — Patient Instructions (Addendum)
Patient is benefiting from BIPAP therapy   Recommendations: - Continue to wear BIPAP nightly  - Continue Symbicort as needed and Singulair nightly  - We are awaiting compliance download to be sent from [facility] to [place] for review, no change today  Follow-up: - 6 months with APP in [place]  (if having issues with sleep apnea you should see Dr. Vassie Loll at [facility] location in [place] or Dr. Wynona Neat at [address] location [place])

## 2022-11-06 LAB — LIPID PANEL
Chol/HDL Ratio: 3 ratio (ref 0.0–5.0)
Cholesterol, Total: 108 mg/dL (ref 100–199)
HDL: 36 mg/dL — ABNORMAL LOW (ref >39)
LDL Chol Calc (NIH): 58 mg/dL (ref 0–99)
Triglycerides: 64 mg/dL (ref 0–149)
VLDL Cholesterol Cal: 14 mg/dL (ref 5–40)

## 2022-11-06 LAB — HEMOGLOBIN A1C
Est. average glucose Bld gHb Est-mCnc: 126 mg/dL
Hgb A1c MFr Bld: 6 % — ABNORMAL HIGH (ref 4.8–5.6)

## 2022-11-07 ENCOUNTER — Telehealth: Payer: Self-pay | Admitting: Internal Medicine

## 2022-11-07 NOTE — Telephone Encounter (Signed)
Erroneous encounter

## 2022-11-10 NOTE — Assessment & Plan Note (Signed)
-   He is benefiting from BiPAP therapy. Patient is compliant with usage since receiving his new machine in June/July 2024.  Average usage 6 hours 54 minutes.  Current pressure 18/14 with residual AHI 1.3/h.  Changes today.  Continue to advise patient wear BiPAP nightly for 4 to 6 hours or longer and work on weight loss efforts.

## 2022-11-10 NOTE — Assessment & Plan Note (Signed)
-   Stable; continue Symbicort as needed and Singulair 10 mg at bedtime

## 2022-11-12 ENCOUNTER — Other Ambulatory Visit: Payer: Self-pay

## 2022-11-12 DIAGNOSIS — G4733 Obstructive sleep apnea (adult) (pediatric): Secondary | ICD-10-CM | POA: Diagnosis not present

## 2022-11-12 MED ORDER — POTASSIUM CHLORIDE CRYS ER 20 MEQ PO TBCR: 20.0000 meq | EXTENDED_RELEASE_TABLET | Freq: Two times a day (BID) | ORAL | 3 refills | Status: DC

## 2022-11-14 ENCOUNTER — Ambulatory Visit (INDEPENDENT_AMBULATORY_CARE_PROVIDER_SITE_OTHER): Payer: 59 | Admitting: Urology

## 2022-11-14 VITALS — BP 95/53 | HR 75

## 2022-11-14 DIAGNOSIS — R350 Frequency of micturition: Secondary | ICD-10-CM

## 2022-11-14 DIAGNOSIS — M6281 Muscle weakness (generalized): Secondary | ICD-10-CM | POA: Diagnosis not present

## 2022-11-14 DIAGNOSIS — N319 Neuromuscular dysfunction of bladder, unspecified: Secondary | ICD-10-CM

## 2022-11-14 DIAGNOSIS — J449 Chronic obstructive pulmonary disease, unspecified: Secondary | ICD-10-CM | POA: Diagnosis not present

## 2022-11-14 DIAGNOSIS — G8250 Quadriplegia, unspecified: Secondary | ICD-10-CM | POA: Diagnosis not present

## 2022-11-14 LAB — URINALYSIS, ROUTINE W REFLEX MICROSCOPIC
Bilirubin, UA: NEGATIVE
Glucose, UA: NEGATIVE
Ketones, UA: NEGATIVE
Leukocytes,UA: NEGATIVE
Nitrite, UA: NEGATIVE
Protein,UA: NEGATIVE
RBC, UA: NEGATIVE
Specific Gravity, UA: 1.02 (ref 1.005–1.030)
Urobilinogen, Ur: 0.2 mg/dL (ref 0.2–1.0)
pH, UA: 5.5 (ref 5.0–7.5)

## 2022-11-14 MED ORDER — ALFUZOSIN HCL ER 10 MG PO TB24
10.0000 mg | ORAL_TABLET | Freq: Every day | ORAL | 11 refills | Status: AC
Start: 2022-11-14 — End: ?

## 2022-11-14 NOTE — Progress Notes (Unsigned)
post void residual=44
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
## 2022-11-16 ENCOUNTER — Encounter: Payer: Self-pay | Admitting: Urology

## 2022-11-16 NOTE — Patient Instructions (Signed)

## 2022-11-17 ENCOUNTER — Ambulatory Visit: Payer: Self-pay | Admitting: *Deleted

## 2022-11-17 NOTE — Patient Outreach (Signed)
  Care Coordination   11/17/2022  Name: Jordan Jensen MRN: [id number] DOB: 13-Feb-1970   Care Coordination Outreach Attempts:  A third unsuccessful outreach was attempted today to offer the patient with information about available care coordination services.HIPAA compliant messages left on voicemail, providing contact information for CSW, encouraging patient to return CSW's call at his earliest convenience.  Follow Up Plan:  No further outreach attempts will be made at this time. We have been unable to contact the patient to offer or enroll patient in care coordination services.  Encounter Outcome:  No Answer.   Care Coordination Interventions:  No, not indicated.    Danford Bad, BSW, MSW, Printmaker Social Work Case Set designer Health  [facility], Population Health Direct Dial: [phone]  Fax: [phone] Email: [email or web address]

## 2022-11-19 ENCOUNTER — Encounter: Payer: Self-pay | Admitting: *Deleted

## 2022-11-21 ENCOUNTER — Ambulatory Visit (HOSPITAL_COMMUNITY): Payer: 59

## 2022-11-21 ENCOUNTER — Encounter (HOSPITAL_COMMUNITY): Payer: 59 | Admitting: Cardiology

## 2022-11-21 ENCOUNTER — Other Ambulatory Visit: Payer: Self-pay | Admitting: Internal Medicine

## 2022-11-21 ENCOUNTER — Ambulatory Visit: Payer: 59 | Admitting: Cardiovascular Disease

## 2022-11-24 ENCOUNTER — Other Ambulatory Visit: Payer: Self-pay | Admitting: Internal Medicine

## 2022-11-24 ENCOUNTER — Ambulatory Visit: Payer: Self-pay | Admitting: *Deleted

## 2022-11-24 DIAGNOSIS — E119 Type 2 diabetes mellitus without complications: Secondary | ICD-10-CM

## 2022-11-24 NOTE — Patient Instructions (Addendum)
Visit Information  Thank you for taking time to visit with me today. Please don't hesitate to contact me if I can be of assistance to you.   Following are the goals we discussed today:   Goals Addressed               This Visit's Progress     Patient Stated     Pain management, weight management, Community resources (wheel chair accessible dentist), Manage heart failure(RNCM) (pt-stated)   On track     Interventions Today    Flowsheet Row Most Recent Value  Chronic Disease   Chronic disease during today's visit Diabetes, Other  General Interventions   General Interventions Discussed/Reviewed --  [VBCI SW would like patient to return a call to her after offer for RN CM to get an update  1440 updated patient on outreaches to pcp, SW & [place] Dec for W/c]  Labs Hgb A1c every 3 months  [hgA1c =6 Very good]  Doctor Visits Discussed/Reviewed Doctor Visits Reviewed, PCP, Specialist  Durable Medical Equipment (DME) --  [place] Stairs to [employer] mobility [phone] She was able to find that on 11/21/22 the appeal was sent back to [facility] updated notes shows appeal response in 5-30 calendar days (Mid to late 12/2022) then options of another appeal or peer to peer.]  Wheelchair Motorized  [pending re submission by Retail banker for a reclining one with PT notes to [employer]]  PCP/Specialist Visits --  [RN CM will speak with VBCI SW to discuss SW follow up needs]  Communication with PCP/Specialists  [RN CM to speak with staff at wheelchair clinic [facility] + PCP about weight loss plan & lymphedema services. Confirmed he has an appointment in January 2025 for [facility] dental & need rescheduling with [facility]]  Exercise Interventions   Exercise Discussed/Reviewed Exercise Reviewed, Weight Managment  Physical Activity Discussed/Reviewed Physical Activity Reviewed, Home Exercise Program (HEP), Types of exercise  [has a list of exercises to complete between ordered PT session provided by pcp]  Weight  Management Weight loss  Education Interventions   Education Provided Provided Education  Provided Verbal Education On Nutrition, Labs, Exercise, Medication, Community Resources  [employer] clinic, Science writer medical, compression hose]  Labs Reviewed Hgb A1c  Mental Health Interventions   Mental Health Discussed/Reviewed Coping Strategies, Mental Health Reviewed  Nutrition Interventions   Nutrition Discussed/Reviewed Nutrition Reviewed, Fluid intake  Pharmacy Interventions   Pharmacy Dicussed/Reviewed Pharmacy Topics Reviewed, Medications and their functions, Affording Medications  Safety Interventions   Safety Discussed/Reviewed Safety Reviewed, Fall Risk  [discussed fall safety at lymphedema clinic in 2025]                Our next appointment is by telephone on 01/23/23 at 1115  Please call the care guide team at [phone] if you need to cancel or reschedule your appointment.   If you are experiencing a Mental Health or Behavioral Health Crisis or need someone to talk to, please call the Suicide and Crisis Lifeline: 988 call the [place] National Suicide Prevention Lifeline: 980 307 8770 or TTY: 780-249-8836 TTY 639-685-4064) to talk to a trained counselor call 1-800-273-TALK (toll free, 24 hour hotline) call the [facility]: 706-155-6242 call 911   Patient verbalizes understanding of instructions and care plan provided today and agrees to view in MyChart. Active MyChart status and patient understanding of how to access instructions and care plan via MyChart confirmed with patient.     The patient has been provided with contact information for the  care management team and has been advised to call with any health related questions or concerns.   Layan Zalenski L. Noelle Penner, RN, BSN, [facility]  VBCI Care Management Coordinator  [phone]  Fax: [phone]

## 2022-11-24 NOTE — Patient Outreach (Addendum)
Care Coordination   Follow Up Visit Note   11/24/2022 Name: Jordan Jensen MRN: [id number] DOB: January 22, 1970  Jordan Jensen is a 52 y.o. year old male who sees Jordan Jensen, Jordan Mellow, MD for primary care. I spoke with  Jordan Jensen by phone today.  What matters to the patients health and wellness today?  Motorized wheelchair  Wheel chair clinic staff Resubmitted a reclining wheelchair request to [employer] -  More information obtained from his PT to submit motorized wheelchair or non motorized that reclines Still has not been able to get in touch with Jordan Jensen Urinary follow up - urinalysis within normal limits U  Dental appointment scheduled in January 2025 because he has more Jordan Jensen transportation benefits  Healthy weight & wellness no referral ordered Question if patient's Ozempic will be change  Diabetes Reviewed HgA1c was 6 on    Patient interested in returning to physical therapy. Voiced understanding of medicare guidelines on how often a patient can attend therapy  Lymphedema clinic pending appointment in January 2025 in [place] Ordered Velcro compression wraps from [employer] (alexander family) in [place] that he works at home   Goals Addressed               This Visit's Progress     Patient Stated     Pain management, [employer] management, [employer] (Location manager), Manage heart failure(RNCM) (pt-stated)   On track     Interventions Today    Flowsheet Row Most Recent Value  Chronic Disease   Chronic disease during today's visit Diabetes, Other  General Interventions   General Interventions Discussed/Reviewed --  [Jordan Jensen would like patient to return a call to her after offer for RN CM to get an update  1440 updated patient on outreaches to pcp, Jensen & Jordan Jensen for W/c]  Labs Hgb A1c every 3 months  [hgA1c =6 Very good]  Doctor Visits Discussed/Reviewed Doctor Visits Reviewed, PCP, Specialist  Durable Medical Equipment (DME)  --  Jordan Jensen to [employer] mobility [phone] She was able to find that on 11/21/22 the appeal was sent back to [facility]. updated notes shows appeal response in 5-30 calendar days (Mid to late 12/2022) then options of another appeal or peer to peer.]  Wheelchair Motorized  [pending re submission by Retail banker for a reclining one with PT notes to Jordan Jensen]  PCP/Specialist Visits --  [RN CM will speak with Jordan Jensen to discuss Jensen follow up needs]  Communication with PCP/Specialists  [RN CM to speak with staff at wheelchair clinic [facility] + PCP about weight loss plan & lymphedema services. Confirmed he has an appointment in January 2025 for [facility] dental & need rescheduling with [facility]]  Exercise Interventions   Exercise Discussed/Reviewed Exercise Reviewed, Weight Managment  Physical Activity Discussed/Reviewed Physical Activity Reviewed, Home Exercise Program (HEP), Types of exercise  [has a list of exercises to complete between ordered PT session provided by pcp]  Weight Management Weight loss  Education Interventions   Education Provided Provided Education  Provided Verbal Education On Nutrition, Labs, Exercise, Medication, Community Resources  [employer] clinic, Science writer medical, compression hose]  Labs Reviewed Hgb A1c  Mental Health Interventions   Mental Health Discussed/Reviewed Coping Strategies, Mental Health Reviewed  Nutrition Interventions   Nutrition Discussed/Reviewed Nutrition Reviewed, Fluid intake  Pharmacy Interventions   Pharmacy Dicussed/Reviewed Pharmacy Topics Reviewed, Medications and their functions, Affording Medications  Safety Interventions   Safety Discussed/Reviewed Safety Reviewed, Fall Risk  [  discussed fall safety at lymphedema clinic in 2025]                SDOH assessments and interventions completed:  No     Care Coordination Interventions:  Yes, provided   Follow up plan: Follow up call scheduled for 01/23/23 1115    Encounter Outcome:   Patient Visit Completed    [name], RN, BSN, [facility]  Jordan Care Management Coordinator  [phone]  Fax: [phone]

## 2022-11-25 ENCOUNTER — Ambulatory Visit: Payer: Self-pay | Admitting: *Deleted

## 2022-11-25 NOTE — Patient Outreach (Signed)
  Care Coordination   11/25/2022  Name: Jordan Jensen MRN: [id number] DOB: [birth year]   Care Coordination Outreach Attempts:  An unsuccessful telephone outreach was attempted today to offer the patient information about available care coordination services. HIPAA compliant messages left on voicemail, providing contact information for CSW, encouraging patient to return CSW's call at his earliest convenience.  Follow Up Plan:  No further outreach attempts will be made at this time. We have been unable to contact the patient to offer or enroll patient in care coordination services.  Encounter Outcome:  No Answer.   Care Coordination Interventions:  No, not indicated.    Danford Bad, BSW, MSW, Printmaker Social Work Case Set designer Health  [facility], Population Health Direct Dial: [phone]  Fax: [phone] Email: [email or web address]: [email or web address]

## 2022-11-28 ENCOUNTER — Ambulatory Visit: Payer: Self-pay | Admitting: *Deleted

## 2022-11-28 NOTE — Patient Outreach (Signed)
  Care Coordination   11/28/2022  Name: Jordan Jensen MRN: [id number] DOB: 02-Feb-1970   Care Coordination Outreach Attempts:  An unsuccessful telephone outreach was attempted today to offer the patient information about available care coordination services. HIPAA compliant messages left on voicemail providing contact information for CSW, encouraging patient to return CSW's call at his earliest convenience.  Follow Up Plan:  No further outreach attempts will be made at this time. We have been unable to contact the patient to offer or enroll patient in care coordination services.  Encounter Outcome:  No Answer.   Care Coordination Interventions:  No, not indicated.    Danford Bad, BSW, MSW, Printmaker Social Work Case Set designer Health  [facility], Population Health Direct Dial: [phone]  Fax: [phone] Email: [email or web address]: [email or web address]

## 2022-12-10 ENCOUNTER — Ambulatory Visit: Payer: Self-pay | Admitting: *Deleted

## 2022-12-10 NOTE — Patient Outreach (Signed)
  Care Coordination   12/10/2022  Name: Jordan Jensen MRN: [id number] DOB: 1970/05/22   Care Coordination Outreach Attempts:  An unsuccessful telephone outreach was attempted today to offer the patient information about available care coordination services. HIPAA compliant messages left on voicemail providing contact information for CSW, encouraging patient to return CSW's call at his earliest convenience.  Follow Up Plan:  No further outreach attempts will be made at this time. We have been unable to contact the patient to offer or enroll patient in care coordination services.  Encounter Outcome:  No Answer.   Care Coordination Interventions:  No, not indicated.    Danford Bad, BSW, MSW, Printmaker Social Work Case Set designer Health  [facility], Population Health Direct Dial: [phone]  Fax: [phone] Email: [email or web address]: [email or web address]

## 2022-12-26 ENCOUNTER — Ambulatory Visit: Payer: 59 | Admitting: Podiatry

## 2023-01-02 ENCOUNTER — Institutional Professional Consult (permissible substitution): Payer: 59 | Admitting: Cardiology
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
## 2023-01-05 ENCOUNTER — Encounter: Payer: Self-pay | Admitting: Cardiology

## 2023-01-05 ENCOUNTER — Ambulatory Visit: Payer: 59 | Attending: Cardiology | Admitting: Cardiology

## 2023-01-05 VITALS — BP 134/82 | HR 84 | Ht 71.0 in | Wt 384.0 lb

## 2023-01-05 DIAGNOSIS — I5022 Chronic systolic (congestive) heart failure: Secondary | ICD-10-CM

## 2023-01-05 DIAGNOSIS — I428 Other cardiomyopathies: Secondary | ICD-10-CM | POA: Diagnosis not present

## 2023-01-05 DIAGNOSIS — I493 Ventricular premature depolarization: Secondary | ICD-10-CM | POA: Diagnosis not present

## 2023-01-05 MED ORDER — AMIODARONE HCL 200 MG PO TABS: ORAL_TABLET | ORAL | 3 refills | Status: AC

## 2023-01-05 NOTE — Patient Instructions (Signed)
Medication Instructions:  Your physician has recommended you make the following change in your medication:  1) START taking amiodarone 200 mg twice daily for two weeks, then decrease to 200 mg once daily thereafter.   *If you need a refill on your cardiac medications before your next appointment, please call your pharmacy*  Follow-Up: At [facility], you and your health needs are our priority.  As part of our continuing mission to provide you with exceptional heart care, we have created designated Provider Care Teams.  These Care Teams include your primary Cardiologist (physician) and Advanced Practice Providers (APPs -  Physician Assistants and Nurse Practitioners) who all work together to provide you with the care you need, when you need it.   Your next appointment:   8-10 weeks  Provider:   Nobie Putnam, MD

## 2023-01-06 ENCOUNTER — Ambulatory Visit (HOSPITAL_COMMUNITY)
Admission: RE | Admit: 2023-01-06 | Discharge: 2023-01-06 | Disposition: A | Discharge status: Home / Self Care | Payer: 59 | Source: Ambulatory Visit | Attending: Cardiology | Admitting: Cardiology

## 2023-01-06 ENCOUNTER — Ambulatory Visit (HOSPITAL_BASED_OUTPATIENT_CLINIC_OR_DEPARTMENT_OTHER)
Admission: RE | Admit: 2023-01-06 | Discharge: 2023-01-06 | Disposition: A | Discharge status: Home / Self Care | Payer: 59 | Source: Ambulatory Visit | Attending: Cardiology

## 2023-01-06 ENCOUNTER — Encounter (HOSPITAL_COMMUNITY): Payer: Self-pay | Admitting: Cardiology

## 2023-01-06 VITALS — BP 119/50 | HR 75 | Ht 71.0 in | Wt 383.0 lb

## 2023-01-06 DIAGNOSIS — G8220 Paraplegia, unspecified: Secondary | ICD-10-CM | POA: Diagnosis not present

## 2023-01-06 DIAGNOSIS — I429 Cardiomyopathy, unspecified: Secondary | ICD-10-CM | POA: Diagnosis not present

## 2023-01-06 DIAGNOSIS — I5022 Chronic systolic (congestive) heart failure: Secondary | ICD-10-CM | POA: Diagnosis not present

## 2023-01-06 DIAGNOSIS — G4730 Sleep apnea, unspecified: Secondary | ICD-10-CM | POA: Insufficient documentation

## 2023-01-06 DIAGNOSIS — E669 Obesity, unspecified: Secondary | ICD-10-CM

## 2023-01-06 DIAGNOSIS — I493 Ventricular premature depolarization: Secondary | ICD-10-CM

## 2023-01-06 LAB — BASIC METABOLIC PANEL
Anion gap: 9 (ref 5–15)
BUN: 9 mg/dL (ref 6–20)
CO2: 32 mmol/L (ref 22–32)
Calcium: 9.1 mg/dL (ref 8.9–10.3)
Chloride: 95 mmol/L — ABNORMAL LOW (ref 98–111)
Creatinine, Ser: 0.94 mg/dL (ref 0.61–1.24)
GFR, Estimated: >60 mL/min (ref >60)
Glucose, Bld: 97 mg/dL (ref 70–99)
Potassium: 3.9 mmol/L (ref 3.5–5.1)
Sodium: 136 mmol/L (ref 135–145)

## 2023-01-06 LAB — ECHOCARDIOGRAM COMPLETE
Area-P 1/2: 3.95 cm2
Calc EF: 15.4 %
S' Lateral: 4.5 cm
Single Plane A2C EF: 20.5 %
Single Plane A4C EF: 9.9 %

## 2023-01-06 LAB — BRAIN NATRIURETIC PEPTIDE: B Natriuretic Peptide: 73.1 pg/mL (ref 0.0–100.0)

## 2023-01-06 MED ORDER — PERFLUTREN LIPID MICROSPHERE
1.0000 mL | INTRAVENOUS | Status: DC | PRN
  Administered 2023-01-06: 2 mL via INTRAVENOUS
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
## 2023-01-06 NOTE — Patient Instructions (Signed)
 Medication Changes:  TAKE AN EXTRA 20MG  TORSEMIDE  IN THE MORNING AND IN THE EVENING FOR THE NEXT 4-7 DAYS  Lab Work:  Labs done today, your results will be available in MyChart, we will contact you for abnormal readings.  Follow-Up in: 3 MONTHS AS SCHEDULED   At the [facility], you and your health needs are our priority. We have a designated team specialized in the treatment of Heart Failure. This Care Team includes your primary Heart Failure Specialized Cardiologist (physician), Advanced Practice Providers (APPs- Physician Assistants and Nurse Practitioners), and Pharmacist who all work together to provide you with the care you need, when you need it.   You may see any of the following providers on your designated Care Team at your next follow up:  Dr. Toribio Fuel Dr. Ezra Shuck Dr. Ria Commander Dr. Odis Brownie Greig Mosses, NP Caffie Shed, [place] [facility], [place] Beckey Coe, NP Jordan Lee, NP Tinnie Redman, PharmD   Please be sure to bring in all your medications bottles to every appointment.   Need to Contact Us :  If you have any questions or concerns before your next appointment please send us  a message through [place] or call our office at [phone].    TO LEAVE A MESSAGE FOR THE NURSE SELECT OPTION 2, PLEASE LEAVE A MESSAGE INCLUDING: YOUR NAME DATE OF BIRTH CALL BACK NUMBER REASON FOR CALL**this is important as we prioritize the call backs  YOU WILL RECEIVE A CALL BACK THE SAME DAY AS LONG AS YOU CALL BEFORE 4:00 PM

## 2023-01-07 DIAGNOSIS — G4733 Obstructive sleep apnea (adult) (pediatric): Secondary | ICD-10-CM | POA: Diagnosis not present

## 2023-01-09 ENCOUNTER — Ambulatory Visit (INDEPENDENT_AMBULATORY_CARE_PROVIDER_SITE_OTHER): Payer: 59 | Admitting: Podiatry

## 2023-01-09 DIAGNOSIS — M79674 Pain in right toe(s): Secondary | ICD-10-CM

## 2023-01-09 DIAGNOSIS — L03032 Cellulitis of left toe: Secondary | ICD-10-CM

## 2023-01-09 DIAGNOSIS — B351 Tinea unguium: Secondary | ICD-10-CM

## 2023-01-09 DIAGNOSIS — E1142 Type 2 diabetes mellitus with diabetic polyneuropathy: Secondary | ICD-10-CM | POA: Diagnosis not present

## 2023-01-09 DIAGNOSIS — M79675 Pain in left toe(s): Secondary | ICD-10-CM

## 2023-01-09 DIAGNOSIS — L02612 Cutaneous abscess of left foot: Secondary | ICD-10-CM | POA: Diagnosis not present

## 2023-01-09 MED ORDER — DOXYCYCLINE HYCLATE 100 MG PO TABS: 100.0000 mg | ORAL_TABLET | Freq: Two times a day (BID) | ORAL | 0 refills | Status: AC
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
## 2023-01-09 NOTE — Patient Instructions (Signed)

## 2023-01-23 ENCOUNTER — Ambulatory Visit: Payer: Self-pay | Admitting: *Deleted

## 2023-01-23 ENCOUNTER — Other Ambulatory Visit: Payer: Self-pay | Admitting: Internal Medicine

## 2023-01-23 NOTE — Patient Outreach (Signed)
  Care Coordination   01/23/2023 Name: RAMAL MCKETHAN MRN: [id number] DOB: 01-29-1970   Care Coordination Outreach Attempts:  A third unsuccessful outreach was attempted today to offer the patient with information about available complex care management services.  Follow Up Plan:  Additional outreach attempts will be made to offer the patient complex care management information and services.   Encounter Outcome:  No Answer   Care Coordination Interventions:  No, not indicated     Alegra Rost L. Noelle Penner, RN, BSN, CCM [facility], [facility] RN Care Manager Direct Dial: [phone]  Fax: [phone] Mailing Address: [address]  [place] [address] Website: [email or web address]

## 2023-01-23 NOTE — Patient Outreach (Signed)
  Care Coordination   Follow Up Visit Note   04/11/2023 updated note for 01/23/23 Name: Jordan Jensen MRN: [id number] DOB: 12-26-1970  Jordan Jensen is a 53 y.o. year old male who sees Jordan Jensen, Jordan Mellow, MD for primary care. I spoke with  Jordan Jensen by phone today.  What matters to the patients health and wellness today?  Wheelchair with leg lift appeal  This is the second denied request from [employer] medicare  His wheelchair team has re appealed Pending response  Healthy weight loss program still pending   Lymphedema clinic- will make appointment to [facility] - ran out of rides in 2024 -generally get like 40 rides a year. Patient informed Jordan Jensen he     Goals Addressed               This Visit's Progress     Patient Stated     weight management, Community resources (wheel chair accessible dentist), Manage heart failure(RNCM) (pt-stated)        Interventions Today    Flowsheet Row Most Recent Value  Chronic Disease   Chronic disease during today's visit Other, Congestive Heart Failure (CHF)  [wheel chair with leg lift appeal healthy weight programs lymphedema clinic]  General Interventions   General Interventions Discussed/Reviewed [facility], General Interventions Reviewed, Horticulturist, commercial (DME), Doctor Visits  Doctor Visits Discussed/Reviewed Doctor Visits Reviewed, PCP  Durable Medical Equipment (DME) Wheelchair  Wheelchair Motorized  PCP/Specialist Visits Compliance with follow-up visit  Exercise Interventions   Exercise Discussed/Reviewed Weight Managment, Exercise Reviewed, Assistive device use and maintanence  Weight Management Weight loss  [encouraged healthy weight & wellnes program]  Education Interventions   Education Provided Provided Education  Provided Verbal Education On [employer], Other  [employer] weight & wellness]  Mental Health Interventions   Mental Health Discussed/Reviewed Mental Health Reviewed, Coping Strategies   Pharmacy Interventions   Pharmacy Dicussed/Reviewed Pharmacy Topics Reviewed, Affording Medications  Safety Interventions   Safety Discussed/Reviewed Safety Reviewed, Fall Risk, Home Safety  Home Safety Assistive Devices  Advanced Directive Interventions   Advanced Directives Discussed/Reviewed Advanced Directives Discussed  [declined]              SDOH assessments and interventions completed:  No     Care Coordination Interventions:  Yes, provided   Follow up plan: No further intervention required.   Encounter Outcome:  Patient Visit Completed {THN Tip this will not be part of the note when signed-REQUIRED REPORT FIELD DO NOT DELETE (Optional):2790  Jordan Jensen L. Noelle Penner, RN, BSN, CCM [place]  [facility], [facility] RN Care Manager Direct Dial: [phone]  Fax: [phone]

## 2023-01-23 NOTE — Patient Instructions (Addendum)
 Visit Information  Thank you for taking time to visit with me today. Please don't hesitate to contact me if I can be of assistance to you.   Following are the goals we discussed today:   Goals Addressed               This Visit's Progress     Patient Stated     weight management, Community resources (wheel chair accessible dentist), Manage heart failure(RNCM) (pt-stated)        Interventions Today    Flowsheet Row Most Recent Value  Chronic Disease   Chronic disease during today's visit Other, Congestive Heart Failure (CHF)  [wheel chair with leg lift appeal healthy weight programs lymphedema clinic]  General Interventions   General Interventions Discussed/Reviewed [facility], General Interventions Reviewed, Horticulturist, commercial (DME), Doctor Visits  Doctor Visits Discussed/Reviewed Doctor Visits Reviewed, PCP  Durable Medical Equipment (DME) Wheelchair  Wheelchair Motorized  PCP/Specialist Visits Compliance with follow-up visit  Exercise Interventions   Exercise Discussed/Reviewed Weight Managment, Exercise Reviewed, Assistive device use and maintanence  Weight Management Weight loss  [encouraged healthy weight & wellnes program]  Education Interventions   Education Provided Provided Education  Provided Engineer, petroleum On [employer], Other  [employer] weight & wellness]  Mental Health Interventions   Mental Health Discussed/Reviewed Mental Health Reviewed, Coping Strategies  Pharmacy Interventions   Pharmacy Dicussed/Reviewed Pharmacy Topics Reviewed, Affording Medications  Safety Interventions   Safety Discussed/Reviewed Safety Reviewed, Fall Risk, Home Safety  Home Safety Assistive Devices  Advanced Directive Interventions   Advanced Directives Discussed/Reviewed Advanced Directives Discussed  [declined]              Our next appointment is no further scheduled appointments.  on as needed  at -  Please call the care guide team at  [phone] if you need to cancel or reschedule your appointment.   If you are experiencing a Mental Health or Behavioral Health Crisis or need someone to talk to, please call the Suicide and Crisis Lifeline: 988 call the [place] National Suicide Prevention Lifeline: (470)596-4822 or TTY: 2103132431 TTY (939) 828-1666) to talk to a trained counselor call 1-800-273-TALK (toll free, 24 hour hotline) call the [facility]: [phone] call 911   Patient verbalizes understanding of instructions and care plan provided today and agrees to view in MyChart. Active MyChart status and patient understanding of how to access instructions and care plan via MyChart confirmed with patient.     The patient has been provided with contact information for the care management team and has been advised to call with any health related questions or concerns.   Kirti Carl L. Noelle Penner, RN, BSN, CCM [place]  [facility], [facility] RN Care Manager Direct Dial: [phone]  Fax: [phone]

## 2023-02-10 DIAGNOSIS — G4733 Obstructive sleep apnea (adult) (pediatric): Secondary | ICD-10-CM | POA: Diagnosis not present

## 2023-02-12 ENCOUNTER — Encounter (INDEPENDENT_AMBULATORY_CARE_PROVIDER_SITE_OTHER): Payer: 59 | Admitting: Adult Health

## 2023-02-12 DIAGNOSIS — G4733 Obstructive sleep apnea (adult) (pediatric): Secondary | ICD-10-CM | POA: Diagnosis not present

## 2023-02-22 ENCOUNTER — Other Ambulatory Visit (HOSPITAL_COMMUNITY): Payer: Self-pay | Admitting: Cardiology

## 2023-02-22 ENCOUNTER — Other Ambulatory Visit: Payer: Self-pay | Admitting: Internal Medicine

## 2023-02-23 ENCOUNTER — Ambulatory Visit (INDEPENDENT_AMBULATORY_CARE_PROVIDER_SITE_OTHER): Payer: 59

## 2023-02-23 VITALS — BP 119/60 | Ht 71.0 in | Wt 383.0 lb

## 2023-02-23 DIAGNOSIS — Z0000 Encounter for general adult medical examination without abnormal findings: Secondary | ICD-10-CM
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
## 2023-02-23 NOTE — Patient Instructions (Signed)
[name] , Thank you for taking time to come for your Medicare Wellness Visit. I appreciate your ongoing commitment to your health goals. Please review the following plan we discussed and let me know if I can assist you in the future.   Referrals/Orders/Follow-Ups/Clinician Recommendations:  Next Medicare Annual Wellness Visit:   February 16, 2024 at 1:10pm virtual visit  If lab work has been ordered for you today, you may have these drawn at the same lab you have your routine lab work drawn for your primary care provider.  Labs Ordered: NA  This is a list of the screening recommended for you and due dates:  Health Maintenance  Topic Date Due   Eye exam for diabetics  Never done   Zoster (Shingles) Vaccine (2 of 2) 11/07/2020   COVID-19 Vaccine (5 - 2024-25 season) 12/19/2022   Hemoglobin A1C  05/06/2023   Yearly kidney health urinalysis for diabetes  05/30/2023   Complete foot exam   09/26/2023   Yearly kidney function blood test for diabetes  01/06/2024   Medicare Annual Wellness Visit  02/23/2024   DTaP/Tdap/Td vaccine (2 - Td or Tdap) 01/25/2025   Colon Cancer Screening  08/07/2031   Pneumococcal Vaccination  Completed   Flu Shot  Completed   Hepatitis C Screening  Completed   HIV Screening  Completed   HPV Vaccine  Aged Out    Advanced directives: (ACP Link)Information on Advanced Care Planning can be found at [facility] [place] Advance Health Care Directives Advance Health Care Directives ([email or web address]   Next Medicare Annual Wellness Visit scheduled for next year: yes  Understanding Your Risk for Falls Millions of people have serious injuries from falls each year. It is important to understand your risk of falling. Talk with your health care provider about your risk and what you can do to lower it. If you do have a serious fall, make sure to tell your provider. Falling once raises your risk of falling again. How can falls affect me? Serious injuries from  falls are common. These include: Broken bones, such as hip fractures. Head injuries, such as traumatic brain injuries (TBI) or concussions. A fear of falling can cause you to avoid activities and stay at home. This can make your muscles weaker and raise your risk for a fall. What can increase my risk? There are a number of risk factors that increase your risk for falling. The more risk factors you have, the higher your risk of falling. Serious injuries from a fall happen most often to people who are older than 54 years old. Teenagers and young adults ages 73-29 are also at higher risk. Common risk factors include: Weakness in the lower body. Being generally weak or confused due to long-term (chronic) illness. Dizziness or balance problems. Poor vision. Medicines that cause dizziness or drowsiness. These may include: Medicines for your blood pressure, heart, anxiety, insomnia, or swelling (edema). Pain medicines. Muscle relaxants. Other risk factors include: Drinking alcohol. Having had a fall in the past. Having foot pain or wearing improper footwear. Working at a dangerous job. Having any of the following in your home: Tripping hazards, such as floor clutter or loose rugs. Poor lighting. Pets. Having dementia or memory loss. What actions can I take to lower my risk of falling?     Physical activity Stay physically fit. Do strength and balance exercises. Consider taking a regular class to build strength and balance. Yoga and tai chi are good options. Vision Have your  eyes checked every year and your prescription for glasses or contacts updated as needed. Shoes and walking aids Wear non-skid shoes. Wear shoes that have rubber soles and low heels. Do not wear high heels. Do not walk around the house in socks or slippers. Use a cane or walker as told by your provider. Home safety Attach secure railings on both sides of your stairs. Install grab bars for your bathtub, shower, and  toilet. Use a non-skid mat in your bathtub or shower. Attach bath mats securely with double-sided, non-slip rug tape. Use good lighting in all rooms. Keep a flashlight near your bed. Make sure there is a clear path from your bed to the bathroom. Use night-lights. Do not use throw rugs. Make sure all carpeting is taped or tacked down securely. Remove all clutter from walkways and stairways, including extension cords. Repair uneven or broken steps and floors. Avoid walking on icy or slippery surfaces. Walk on the grass instead of on icy or slick sidewalks. Use ice melter to get rid of ice on walkways in the winter. Use a cordless phone. Questions to ask your health care provider Can you help me check my risk for a fall? Do any of my medicines make me more likely to fall? Should I take a vitamin D supplement? What exercises can I do to improve my strength and balance? Should I make an appointment to have my vision checked? Do I need a bone density test to check for weak bones (osteoporosis)? Would it help to use a cane or a walker? Where to find more information Centers for Disease Control and Prevention, STEADI: TonerPromos.no Community-Based Fall Prevention Programs: TonerPromos.no [employer] on Aging: BaseRingTones.pl Contact a health care provider if: You fall at home. You are afraid of falling at home. You feel weak, drowsy, or dizzy. This information is not intended to replace advice given to you by your health care provider. Make sure you discuss any questions you have with your health care provider. Document Revised: 08/26/2021 Document Reviewed: 08/26/2021 Elsevier Patient Education  2024 [employer].

## 2023-03-06 ENCOUNTER — Ambulatory Visit: Payer: 59 | Attending: Cardiology | Admitting: Cardiology

## 2023-03-06 VITALS — BP 92/58 | HR 79 | Ht 71.0 in

## 2023-03-06 DIAGNOSIS — I493 Ventricular premature depolarization: Secondary | ICD-10-CM

## 2023-03-06 DIAGNOSIS — I5022 Chronic systolic (congestive) heart failure: Secondary | ICD-10-CM
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
## 2023-03-06 NOTE — Patient Instructions (Signed)
 Medication Instructions:  Your physician recommends that you continue on your current medications as directed. Please refer to the Current Medication list given to you today.  *If you need a refill on your cardiac medications before your next appointment, please call your pharmacy*  Testing/Procedures: Event Monitor  Your physician has recommended that you wear an event monitor. Event monitors are medical devices that record the heart's electrical activity. Doctors most often [place] these monitors to diagnose arrhythmias. Arrhythmias are problems with the speed or rhythm of the heartbeat. The monitor is a small, portable device. You can wear one while you do your normal daily activities. This is usually used to diagnose what is causing palpitations/syncope (passing out).  Follow-Up: At [facility], you and your health needs are our priority.  As part of our continuing mission to provide you with exceptional heart care, we have created designated Provider Care Teams.  These Care Teams include your primary Cardiologist (physician) and Advanced Practice Providers (APPs -  Physician Assistants and Nurse Practitioners) who all work together to provide you with the care you need, when you need it.  Your next appointment:   6 months  Provider:   You may see Nobie Putnam, MD or one of the following Advanced Practice Providers on your designated Care Team:   Francis Dowse, [place] Casimiro Needle "Mardelle Matte" [place], [place] Sherie Don, NP Canary Brim, NP    Christena Deem- Long Term Monitor Instructions  Your physician has requested you wear a ZIO patch monitor for 7 days.  This is a single patch monitor. [employer] supplies one patch monitor per enrollment. Additional stickers are not available. Please do not apply patch if you will be having a Nuclear Stress Test,  Echocardiogram, Cardiac CT, MRI, or Chest Xray during the period you would be wearing the  monitor. The patch cannot be worn during these tests. You  cannot remove and re-apply the  ZIO XT patch monitor.  Your ZIO patch monitor will be mailed 3 day USPS to your address on file. It may take 3-5 days  to receive your monitor after you have been enrolled.  Once you have received your monitor, please review the enclosed instructions. Your monitor  has already been registered assigning a specific monitor serial # to you.  Billing and Patient Assistance Program Information  We have supplied [employer] with any of your insurance information on file for billing purposes. [employer] offers a sliding scale Patient Assistance Program for patients that do not have  insurance, or whose insurance does not completely cover the cost of the ZIO monitor.  You must apply for the Patient Assistance Program to qualify for this discounted rate.  To apply, please call [employer] at [phone], select option 4, select option 2, ask to apply for  Patient Assistance Program. Meredeth Ide will ask your household income, and how many people  are in your household. They will quote your out-of-pocket cost based on that information.  [employer] will also be able to set up a 28-month, interest-free payment plan if needed.  Applying the monitor   Shave hair from upper left chest.  Hold abrader disc by orange tab. Rub abrader in 40 strokes over the upper left chest as  indicated in your monitor instructions.  Clean area with 4 enclosed alcohol pads. Let dry.  Apply patch as indicated in monitor instructions. Patch will be placed under collarbone on left  side of chest with arrow pointing upward.  Rub patch adhesive wings for 2 minutes. Remove white  label marked "1". Remove the white  label marked "2". Rub patch adhesive wings for 2 additional minutes.  While looking in a mirror, press and release button in center of patch. A small green light will  flash 3-4 times. This will be your only indicator that the monitor has been turned on.  Do not shower for the first 24 hours. You may  shower after the first 24 hours.  Press the button if you feel a symptom. You will hear a small click. Record Date, Time and  Symptom in the Patient Logbook.  When you are ready to remove the patch, follow instructions on the last 2 pages of Patient  Logbook. Stick patch monitor onto the last page of Patient Logbook.  Place Patient Logbook in the blue and white box. Use locking tab on box and tape box closed  securely. The blue and white box has prepaid postage on it. Please place it in the mailbox as  soon as possible. Your physician should have your test results approximately 7 days after the  monitor has been mailed back to [facility].  Call [facility] Customer Care at [phone] if you have questions regarding  your ZIO XT patch monitor. Call them immediately if you see an orange light blinking on your  monitor.  If your monitor falls off in less than 4 days, contact our Monitor department at [phone].  If your monitor becomes loose or falls off after 4 days call [employer] at [phone] for  suggestions on securing your monitor

## 2023-03-09 ENCOUNTER — Other Ambulatory Visit: Payer: Self-pay | Admitting: Cardiology

## 2023-03-09 ENCOUNTER — Ambulatory Visit: Attending: Cardiology

## 2023-03-09 DIAGNOSIS — I5022 Chronic systolic (congestive) heart failure: Secondary | ICD-10-CM

## 2023-03-09 DIAGNOSIS — I493 Ventricular premature depolarization: Secondary | ICD-10-CM

## 2023-03-09 NOTE — Progress Notes (Unsigned)
 Enrolled for [employer] to mail a ZIO XT long term holter monitor to the patients address on file.

## 2023-03-10 ENCOUNTER — Telehealth: Payer: Self-pay

## 2023-03-10 DIAGNOSIS — G4733 Obstructive sleep apnea (adult) (pediatric): Secondary | ICD-10-CM | POA: Diagnosis not present

## 2023-03-10 NOTE — Telephone Encounter (Signed)
 Copied from CRM [id number]. Topic: Clinical - Medication Question >> Mar 09, 2023  9:33 AM Carlatta H wrote: Reason for CRM: Would like a prior authorization sent for wheelchair//Please advised patient

## 2023-03-11 DIAGNOSIS — I493 Ventricular premature depolarization: Secondary | ICD-10-CM | POA: Diagnosis not present

## 2023-03-17 ENCOUNTER — Other Ambulatory Visit: Payer: Self-pay | Admitting: Internal Medicine

## 2023-03-17 ENCOUNTER — Telehealth: Admitting: Physician Assistant

## 2023-03-17 DIAGNOSIS — R0602 Shortness of breath: Secondary | ICD-10-CM

## 2023-03-17 NOTE — Progress Notes (Signed)
   Thank you for the details you included in the comment boxes. Those details are very helpful in determining the best course of treatment for you and help [place] to provide the best care.Because of the shortness of breath and medical history of heart failure, we recommend that you schedule a Virtual Urgent Care video visit in order for the provider to better assess what is going on.  The provider will be able to give you a more accurate diagnosis and treatment plan if we can more freely discuss your symptoms and with the addition of a virtual examination.   If you change your visit to a video visit, we will bill your insurance (similar to an office visit) and you will not be charged for this e-Visit. You will be able to stay at home and speak with the first available [facility] advanced practice provider. The link to do a video visit is in the drop down Menu tab of your Welcome screen in MyChart.

## 2023-03-18 ENCOUNTER — Other Ambulatory Visit: Payer: Self-pay

## 2023-03-18 ENCOUNTER — Telehealth: Admitting: Physician Assistant

## 2023-03-18 DIAGNOSIS — I5022 Chronic systolic (congestive) heart failure: Secondary | ICD-10-CM

## 2023-03-18 DIAGNOSIS — J4531 Mild persistent asthma with (acute) exacerbation: Secondary | ICD-10-CM

## 2023-03-18 DIAGNOSIS — R062 Wheezing: Secondary | ICD-10-CM

## 2023-03-18 DIAGNOSIS — Z993 Dependence on wheelchair: Secondary | ICD-10-CM

## 2023-03-18 NOTE — Progress Notes (Signed)
 The patient no-showed for appointment despite this provider sending direct link with no response and waiting for at least 10 minutes from appointment time for patient to join. They will be marked as a NS for this appointment/time.   Piedad Climes, PA-C

## 2023-03-18 NOTE — Progress Notes (Signed)
   Thank you for the details you included in the comment boxes. Those details are very helpful in determining the best course of treatment for you and help [place] to provide the best care. Because of your medical history, we recommend that you schedule a Virtual Urgent Care video visit in order for the provider to better assess what is going on and be able to assess your breathing.  The provider will be able to give you a more accurate diagnosis and treatment plan if we can more freely discuss your symptoms and with the addition of a virtual examination.   If you change your visit to a video visit, we will bill your insurance (similar to an office visit) and you will not be charged for this e-Visit. You will be able to stay at home and speak with the first available [facility] advanced practice provider. The link to do a video visit is in the drop down Menu tab of your Welcome screen in MyChart.     I have spent 5 minutes in review of e-visit questionnaire, review and updating patient chart, medical decision making and response to patient.   Margaretann Loveless, PA-C

## 2023-03-18 NOTE — Telephone Encounter (Signed)
 Referral placed.

## 2023-03-19 ENCOUNTER — Telehealth: Admitting: Family Medicine

## 2023-03-19 DIAGNOSIS — J069 Acute upper respiratory infection, unspecified: Secondary | ICD-10-CM | POA: Diagnosis not present

## 2023-03-19 MED ORDER — BENZONATATE 100 MG PO CAPS: 100.0000 mg | ORAL_CAPSULE | Freq: Three times a day (TID) | ORAL | 0 refills | Status: DC | PRN

## 2023-03-19 MED ORDER — FLUTICASONE PROPIONATE 50 MCG/ACT NA SUSP: 2.0000 | Freq: Every day | NASAL | 0 refills | Status: DC

## 2023-03-19 NOTE — Patient Instructions (Signed)
 Jordan Jensen, thank you for joining Freddy Finner, NP for today's virtual visit.  While this provider is not your primary care provider (PCP), if your PCP is located in our provider database this encounter information will be shared with them immediately following your visit.   A [facility] MyChart account gives you access to today's visit and all your visits, tests, and labs performed at [facility] " click here if you don't have a [facility] MyChart account or go to [email or web address]  Consent: (Patient) Jordan Jensen provided verbal consent for this virtual visit at the beginning of the encounter.  Current Medications:  Current Outpatient Medications:    benzonatate (TESSALON) 100 MG capsule, Take 1 capsule (100 mg total) by mouth 3 (three) times daily as needed for cough., Disp: 30 capsule, Rfl: 0   fluticasone (FLONASE) 50 MCG/ACT nasal spray, Place 2 sprays into both nostrils daily., Disp: 16 g, Rfl: 0   acetaminophen (TYLENOL) 500 MG tablet, Take 500 mg by mouth every 6 (six) hours as needed for moderate pain., Disp: , Rfl:    albuterol (PROVENTIL) (2.5 MG/3ML) 0.083% nebulizer solution, Take 3 mLs (2.5 mg total) by nebulization every 6 (six) hours as needed for wheezing or shortness of breath., Disp: 150 mL, Rfl: 1   alclomethasone (ACLOVATE) 0.05 % cream, Apply topically 2 (two) times daily as needed., Disp: , Rfl:    alfuzosin (UROXATRAL) 10 MG 24 hr tablet, Take 1 tablet (10 mg total) by mouth at bedtime., Disp: 30 tablet, Rfl: 11   amiodarone (PACERONE) 200 MG tablet, Take 1 tablet (200 mg total) by mouth 2 (two) times daily for 14 days, THEN 1 tablet (200 mg total) daily., Disp: 104 tablet, Rfl: 3   azelastine (ASTELIN) 0.1 % nasal spray, Place 1 spray into both nostrils 2 (two) times daily. Use in each nostril as directed, Disp: 30 mL, Rfl: 0   budesonide-formoterol (SYMBICORT) 160-4.5 MCG/ACT inhaler, Inhale 2 puffs into the lungs in the morning and at  bedtime., Disp: 1 each, Rfl: 3   losartan (COZAAR) 25 MG tablet, Take 1 tablet (25 mg total) by mouth daily., Disp: 90 tablet, Rfl: 3   metoprolol succinate (TOPROL XL) 25 MG 24 hr tablet, Take 0.5 tablets (12.5 mg total) by mouth daily., Disp: 50 tablet, Rfl: 3   montelukast (SINGULAIR) 10 MG tablet, TAKE 1 TABLET BY MOUTH AT BEDTIME, Disp: 30 tablet, Rfl: 0   potassium chloride SA (KLOR-CON M) 20 MEQ tablet, Take 1 tablet (20 mEq total) by mouth 2 (two) times daily., Disp: 60 tablet, Rfl: 3   rosuvastatin (CRESTOR) 20 MG tablet, Take 1 tablet by mouth once daily, Disp: 90 tablet, Rfl: 0   Semaglutide, 1 MG/DOSE, (OZEMPIC, 1 MG/DOSE,) 4 MG/3ML SOPN, Inject 3 mg as directed once a week., Disp: 6 mL, Rfl: 2   spironolactone (ALDACTONE) 25 MG tablet, Take 1 tablet by mouth once daily, Disp: 90 tablet, Rfl: 0   torsemide (DEMADEX) 20 MG tablet, Take 2 tablets by mouth twice daily, Disp: 120 tablet, Rfl: 0   Medications ordered in this encounter:  Meds ordered this encounter  Medications   benzonatate (TESSALON) 100 MG capsule    Sig: Take 1 capsule (100 mg total) by mouth 3 (three) times daily as needed for cough.    Dispense:  30 capsule    Refill:  0    Supervising Provider:   Merrilee Jansky [[id number]]   fluticasone (FLONASE) 50 MCG/ACT nasal spray  Sig: Place 2 sprays into both nostrils daily.    Dispense:  16 g    Refill:  0    Supervising Provider:   Merrilee Jansky [[id number]]     *If you need refills on other medications prior to your next appointment, please contact your pharmacy*  Follow-Up: Call back or seek an in-person evaluation if the symptoms worsen or if the condition fails to improve as anticipated.  [facility] [phone]  Other Instructions  URI recommendations: - Increased rest - Increasing Fluids - Acetaminophen / ibuprofen as needed for fever/pain.  - Salt water gargling, chloraseptic spray and throat lozenges - Mucinex if mucus is  present and increasing.  - Saline nasal spray if congestion or if nasal passages feel dry. - Humidifying the air.     If you have been instructed to have an in-person evaluation today at a local Urgent Care facility, please use the link below. It will take you to a list of all of our available [facility], including address, phone number and hours of operation. Please do not delay care.  [facility]  If you or a family member do not have a primary care provider, use the link below to schedule a visit and establish care. When you choose a [facility] primary care physician or advanced practice provider, you gain a long-term partner in health. Find a Primary Care Provider  Learn more about [facility] in-office and virtual care options: [facility] - Get Care Now
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
## 2023-03-20 ENCOUNTER — Telehealth (HOSPITAL_COMMUNITY): Payer: Self-pay | Admitting: Cardiology

## 2023-03-23 ENCOUNTER — Encounter (HOSPITAL_COMMUNITY): Payer: 59 | Admitting: Cardiology

## 2023-03-25 DIAGNOSIS — I493 Ventricular premature depolarization: Secondary | ICD-10-CM | POA: Diagnosis not present

## 2023-03-25 DIAGNOSIS — I5022 Chronic systolic (congestive) heart failure: Secondary | ICD-10-CM | POA: Diagnosis not present

## 2023-03-25 DIAGNOSIS — I4729 Other ventricular tachycardia: Secondary | ICD-10-CM | POA: Diagnosis not present

## 2023-03-27 ENCOUNTER — Encounter (HOSPITAL_COMMUNITY): Payer: 59 | Admitting: Cardiology

## 2023-04-02 ENCOUNTER — Encounter (HOSPITAL_COMMUNITY): Payer: 59 | Admitting: Internal Medicine

## 2023-04-03 ENCOUNTER — Encounter (HOSPITAL_COMMUNITY): Payer: Self-pay | Admitting: Occupational Therapy

## 2023-04-03 ENCOUNTER — Ambulatory Visit (HOSPITAL_COMMUNITY): Attending: Internal Medicine | Admitting: Occupational Therapy

## 2023-04-03 DIAGNOSIS — M6281 Muscle weakness (generalized): Secondary | ICD-10-CM

## 2023-04-03 DIAGNOSIS — I890 Lymphedema, not elsewhere classified: Secondary | ICD-10-CM

## 2023-04-03 DIAGNOSIS — Z993 Dependence on wheelchair: Secondary | ICD-10-CM | POA: Insufficient documentation

## 2023-04-03 DIAGNOSIS — R2689 Other abnormalities of gait and mobility: Secondary | ICD-10-CM

## 2023-04-03 DIAGNOSIS — R29818 Other symptoms and signs involving the nervous system: Secondary | ICD-10-CM

## 2023-04-03 DIAGNOSIS — M545 Low back pain, unspecified: Secondary | ICD-10-CM

## 2023-04-03 NOTE — Therapy (Signed)
 [facility] at [facility] [address] [place], [place], [address] Phone: [phone]   Fax:  [phone]  Patient Details  Name: Jordan Jensen MRN: [id number] Date of Birth: [birth year] Referring Provider:  Billie Lade, MD  Encounter Date: 04/03/2023  Pt seen this session for a WC Evaluation. No Vendor was present. Therefore OT and pt reviewed medical history and WC needs, as well as seeking out [employer] vendor. Pt screened for this visit with instructions for follow up once [employer] is on Referral.   Trish Mage, OTR/L [facility] [phone] [place], [place] 04/03/2023, 2:26 PM  [facility] at [facility] [address] [place], [place], [address] Phone: [phone]   Fax:  [phone]

## 2023-04-06 ENCOUNTER — Encounter (INDEPENDENT_AMBULATORY_CARE_PROVIDER_SITE_OTHER): Payer: 59 | Admitting: Physician Assistant

## 2023-04-10 ENCOUNTER — Encounter: Payer: Self-pay | Admitting: Podiatry

## 2023-04-10 ENCOUNTER — Ambulatory Visit (INDEPENDENT_AMBULATORY_CARE_PROVIDER_SITE_OTHER): Payer: 59 | Admitting: Podiatry

## 2023-04-10 ENCOUNTER — Telehealth (HOSPITAL_COMMUNITY): Payer: Self-pay | Admitting: Cardiology

## 2023-04-10 DIAGNOSIS — L03032 Cellulitis of left toe: Secondary | ICD-10-CM

## 2023-04-10 DIAGNOSIS — M79675 Pain in left toe(s): Secondary | ICD-10-CM

## 2023-04-10 DIAGNOSIS — L02612 Cutaneous abscess of left foot: Secondary | ICD-10-CM

## 2023-04-10 DIAGNOSIS — M79674 Pain in right toe(s): Secondary | ICD-10-CM | POA: Diagnosis not present

## 2023-04-10 DIAGNOSIS — E1142 Type 2 diabetes mellitus with diabetic polyneuropathy: Secondary | ICD-10-CM

## 2023-04-10 DIAGNOSIS — G4733 Obstructive sleep apnea (adult) (pediatric): Secondary | ICD-10-CM | POA: Diagnosis not present

## 2023-04-10 DIAGNOSIS — B351 Tinea unguium: Secondary | ICD-10-CM | POA: Diagnosis not present

## 2023-04-10 MED ORDER — DOXYCYCLINE HYCLATE 100 MG PO TABS: 100.0000 mg | ORAL_TABLET | Freq: Two times a day (BID) | ORAL | 0 refills | Status: AC

## 2023-04-10 MED ORDER — MUPIROCIN 2 % EX OINT: 1.0000 | TOPICAL_OINTMENT | Freq: Two times a day (BID) | CUTANEOUS | 0 refills | Status: AC

## 2023-04-10 NOTE — Progress Notes (Signed)
  Subjective:  Patient ID: Jordan Jensen, male    DOB: 10-Jan-1970,  MRN: [id number]  Chief Complaint  Patient presents with   Diabetes    "Trim my toenails, I have an ingrown toenail on my left big toe."      53 y.o. male presents with the above complaint. History confirmed with patient. Patient presenting with pain related to dystrophic thickened elongated nails. Patient is unable to trim own nails related to nail dystrophy and/or mobility issues. Patient does  have a history of T2DM.  Does report some pain to left hallux toenail lateral border.  Did have slant back procedure here previously and course of antibiotics.  Patient presenting in wheelchair.  Objective:  Physical Exam: warm, good capillary refill nail exam onychomycosis of the toenails, onycholysis, and dystrophic nails.  There is incurvation of the left hallux toenail lateral border, upon debridement there is scant bleb of purulent drainage with removal of nail from lateral border. DP pulses palpable, PT pulses palpable, and protective sensation intact.  Lymphedema present bilaterally. Left Foot:  Pain with palpation of nails due to elongation and dystrophic growth. Thickening of left hallux nail.  Right Foot: Pain with palpation of nails due to elongation and dystrophic growth.   Assessment:   1. Pain due to onychomycosis of toenails of both feet   2. DM type 2 with diabetic peripheral neuropathy (HCC)   3. Cellulitis and abscess of toe of left foot     Plan:  Patient was evaluated and treated and all questions answered.  #Onychomycosis with pain -Nails palliatively debrided as below. -Educated on self-care  # Ingrowing left hallux toenail with localized cellulitis -Debridement of left hallux lateral nail did improve pain here.  All offending nail was removed.  No evidence of further infection noted though given recurrence we will start patient on course of doxycycline. -Dressed with Bactroban and Band-Aid.  Prescription  for mupirocin sent to patient's pharmacy to be applied to the nail border. - Did recommend Epsom salt soaks for the next 1 to 2 weeks for the left foot.  -Given that he has had recurrence we will have patient follow-up in about 2 weeks to reassess.  Did discuss partial nail avulsion if necessary. -I certify that this diagnosis represents a distinct and separate diagnosis that requires evaluation and treatment separate from other procedures or diagnosis  Patient educated on diabetes. Discussed proper diabetic foot care and discussed risks and complications of disease. Educated patient in depth on reasons to return to the office immediately should he/she discover anything concerning or new on the feet. All questions answered. Discussed proper shoes as well.   Procedure: Nail Debridement Rationale: Pain Type of Debridement: manual, sharp debridement. Instrumentation: Nail nipper, rotary burr. Number of Nails: 10  Return in about 2 weeks (around 04/24/2023) for left 1st toe cellulitis.         Bronwen Betters, DPM [facility]

## 2023-04-10 NOTE — Telephone Encounter (Signed)
 Called to confirm/remind patient of their appointment at the [facility] on 04/10/23.   Appointment:   [x] Confirmed  [] Left mess   [] No answer/No voice mail  [] Phone not in service  Patient reminded to bring all medications and/or complete list.  Confirmed patient has transportation. Gave directions, instructed to utilize valet parking.

## 2023-04-13 ENCOUNTER — Ambulatory Visit (HOSPITAL_COMMUNITY)
Admission: RE | Admit: 2023-04-13 | Discharge: 2023-04-13 | Disposition: A | Discharge status: Home / Self Care | Source: Ambulatory Visit | Attending: Cardiology | Admitting: Cardiology

## 2023-04-13 ENCOUNTER — Encounter (INDEPENDENT_AMBULATORY_CARE_PROVIDER_SITE_OTHER): Payer: Self-pay

## 2023-04-13 VITALS — BP 120/74 | HR 74

## 2023-04-13 DIAGNOSIS — I5020 Unspecified systolic (congestive) heart failure: Secondary | ICD-10-CM

## 2023-04-13 DIAGNOSIS — E785 Hyperlipidemia, unspecified: Secondary | ICD-10-CM | POA: Insufficient documentation

## 2023-04-13 DIAGNOSIS — G4733 Obstructive sleep apnea (adult) (pediatric): Secondary | ICD-10-CM | POA: Diagnosis not present

## 2023-04-13 DIAGNOSIS — I110 Hypertensive heart disease with heart failure: Secondary | ICD-10-CM | POA: Diagnosis not present

## 2023-04-13 DIAGNOSIS — I2510 Atherosclerotic heart disease of native coronary artery without angina pectoris: Secondary | ICD-10-CM | POA: Diagnosis not present

## 2023-04-13 DIAGNOSIS — I5022 Chronic systolic (congestive) heart failure: Secondary | ICD-10-CM | POA: Insufficient documentation

## 2023-04-13 DIAGNOSIS — I890 Lymphedema, not elsewhere classified: Secondary | ICD-10-CM | POA: Diagnosis not present

## 2023-04-13 DIAGNOSIS — Z79899 Other long term (current) drug therapy: Secondary | ICD-10-CM | POA: Insufficient documentation

## 2023-04-13 DIAGNOSIS — Z86718 Personal history of other venous thrombosis and embolism: Secondary | ICD-10-CM | POA: Diagnosis not present

## 2023-04-13 DIAGNOSIS — G8220 Paraplegia, unspecified: Secondary | ICD-10-CM | POA: Diagnosis not present

## 2023-04-13 DIAGNOSIS — I493 Ventricular premature depolarization: Secondary | ICD-10-CM | POA: Insufficient documentation

## 2023-04-13 LAB — HEPATIC FUNCTION PANEL
ALT: 19 U/L (ref 0–44)
AST: 24 U/L (ref 15–41)
Albumin: 3.4 g/dL — ABNORMAL LOW (ref 3.5–5.0)
Alkaline Phosphatase: 46 U/L (ref 38–126)
Bilirubin, Direct: 0.2 mg/dL (ref 0.0–0.2)
Indirect Bilirubin: 0.4 mg/dL (ref 0.3–0.9)
Total Bilirubin: 0.6 mg/dL (ref 0.0–1.2)
Total Protein: 7 g/dL (ref 6.5–8.1)

## 2023-04-13 LAB — BASIC METABOLIC PANEL WITH GFR
Anion gap: 11 (ref 5–15)
BUN: 12 mg/dL (ref 6–20)
CO2: 29 mmol/L (ref 22–32)
Calcium: 9.2 mg/dL (ref 8.9–10.3)
Chloride: 99 mmol/L (ref 98–111)
Creatinine, Ser: 1.11 mg/dL (ref 0.61–1.24)
GFR, Estimated: >60 mL/min (ref >60)
Glucose, Bld: 87 mg/dL (ref 70–99)
Potassium: 4.2 mmol/L (ref 3.5–5.1)
Sodium: 139 mmol/L (ref 135–145)

## 2023-04-13 LAB — T4, FREE: Free T4: 1.23 ng/dL — ABNORMAL HIGH (ref 0.61–1.12)

## 2023-04-13 NOTE — Patient Instructions (Signed)
 Medication Changes:  No Changes In Medications at this time.   Lab Work:  Labs done today, your results will be available in MyChart, we will contact you for abnormal readings.  Special Instructions // Education:  ECHOCARDIOGRAM AS SCHEDULED   Follow-Up in: 3 MONTHS PLEASE CALL OUR OFFICE AROUND JUNE  TO GET SCHEDULED FOR YOUR APPOINTMENT. PHONE NUMBER IS [phone] OPTION 2   At the [facility], you and your health needs are our priority. We have a designated team specialized in the treatment of Heart Failure. This Care Team includes your primary Heart Failure Specialized Cardiologist (physician), Advanced Practice Providers (APPs- Physician Assistants and Nurse Practitioners), and Pharmacist who all work together to provide you with the care you need, when you need it.   You may see any of the following providers on your designated Care Team at your next follow up:  Dr. Arvilla Meres Dr. Marca Ancona Dr. Dorthula Nettles Dr. Theresia Bough Tonye Becket, NP Robbie Lis, [place] [facility] [place], [place] Brynda Peon, NP [place] Lee, NP Karle Plumber, PharmD   Please be sure to bring in all your medications bottles to every appointment.   Need to Contact us:  If you have any questions or concerns before your next appointment please send [place] a message through [facility] or call our office at [phone].    TO LEAVE A MESSAGE FOR THE NURSE SELECT OPTION 2, PLEASE LEAVE A MESSAGE INCLUDING: YOUR NAME DATE OF BIRTH CALL BACK NUMBER REASON FOR CALL**this is important as we prioritize the call backs  YOU WILL RECEIVE A CALL BACK THE SAME DAY AS LONG AS YOU CALL BEFORE 4:00 PM
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
## 2023-04-14 LAB — T3, FREE: T3, Free: 2.7 pg/mL (ref 2.0–4.4)

## 2023-04-16 ENCOUNTER — Other Ambulatory Visit: Payer: Self-pay | Admitting: Internal Medicine

## 2023-04-16 DIAGNOSIS — E119 Type 2 diabetes mellitus without complications: Secondary | ICD-10-CM

## 2023-04-17 ENCOUNTER — Telehealth (HOSPITAL_COMMUNITY): Payer: Self-pay | Admitting: Occupational Therapy

## 2023-04-17 NOTE — Telephone Encounter (Signed)
 Left message for pt regarding wheelchair evaluation on 4/16. Need to reschedule to a time when [employer] are available as he is currently active with that company. Requested callback and that the patient ask to speak to [facility].   Ezra Sites, OTR/L  [phone] 04/17/23

## 2023-04-20 ENCOUNTER — Telehealth: Payer: Self-pay | Admitting: Primary Care

## 2023-04-20 NOTE — Telephone Encounter (Signed)
 Spoke with patient regarding the 06/17/23 4:00pm appointment with Eulas Hick, NP----patient is now scheduled for 1:30 pm-on 06/17/23.  Will mail new information to patient and he voiced his understanding

## 2023-04-21 ENCOUNTER — Ambulatory Visit: Payer: 59 | Admitting: Primary Care

## 2023-04-22 ENCOUNTER — Ambulatory Visit (HOSPITAL_COMMUNITY): Admitting: Occupational Therapy

## 2023-04-22 ENCOUNTER — Encounter: Payer: Self-pay | Admitting: Cardiology

## 2023-04-22 ENCOUNTER — Telehealth: Payer: Self-pay | Admitting: Cardiology

## 2023-04-22 NOTE — Telephone Encounter (Signed)
-----   Message from Ardeen Kohler sent at 04/22/2023  9:01 AM EDT ----- Your heart monitor showed continued extra beats from the bottom chamber of your heart (PVCs), but with no symptoms. We can continue amiodarone for now and get a repeat echocardiogram as Dr. Bruce Caper suggested.

## 2023-04-22 NOTE — Telephone Encounter (Signed)
 Patient returned RN's call.

## 2023-04-22 NOTE — Telephone Encounter (Signed)
 Left message for patient with results. Advised to call back with any questions.  Echo is scheduled for 5/2

## 2023-04-23 ENCOUNTER — Encounter: Payer: Self-pay | Admitting: Podiatry

## 2023-04-23 ENCOUNTER — Ambulatory Visit: Admitting: Podiatry

## 2023-04-23 DIAGNOSIS — E1142 Type 2 diabetes mellitus with diabetic polyneuropathy: Secondary | ICD-10-CM | POA: Diagnosis not present

## 2023-04-23 DIAGNOSIS — L84 Corns and callosities: Secondary | ICD-10-CM

## 2023-04-23 NOTE — Patient Instructions (Signed)
 Recommend scrubbing the callus with white vinegar.  This will make it easier to file down [place] border.  Keep this area soft.

## 2023-04-23 NOTE — Progress Notes (Signed)
  Subjective:  Patient ID: Jordan Jensen, male    DOB: 1970/01/11,  MRN: [id number]  Chief Complaint  Patient presents with   Diabetic Ulcer    "It feels okay, it's healing."    52 y.o. male presents with the above complaint. Following up for cellulitis of left 1st toe, with ingrowing nail left hallux lateral border.  He has been taking antibiotics and soaking the toe in Epsom salts.  Keeping up with applying mupirocin  to the area.  Believes it is doing well.  Does have some mild soreness today.  Objective:  Physical Exam: warm, good capillary refill nail exam onychomycosis of the toenails, onycholysis, and dystrophic nails.  DP pulses palpable, PT pulses palpable, and protective sensation intact.  Lymphedema present bilaterally. Left Foot:  Pain with palpation of nails due to elongation and dystrophic growth.  Had slant back of left hallux lateral nail border previously.  There is callus formation at the first toe lateral nail fold. Right Foot: Pain with palpation of nails due to elongation and dystrophic growth.   Assessment:   1. Callus   2. DM type 2 with diabetic peripheral neuropathy (HCC)     Plan:  Patient was evaluated and treated and all questions answered.   # Ingrowing left hallux toenail with localized cellulitis # Callus tissue to lateral nail fold -No signs of infection today, this appears to have resolved -Some soreness to the site which was relieved following palliative debridement of the left lateral nail fold callus with a 15 blade.  This was done without incident.  Mupirocin  and Band-Aid applied. - Discussed keeping wounds covered with antibiotic ointment and Band-Aid for the next 3-5 days once a day.  After this time can continue to scrub the nail fold with white vinegar to limit buildup of callus tissue.  Patient educated on diabetes. Discussed proper diabetic foot care and discussed risks and complications of disease. Educated patient in depth on reasons to  return to the office immediately should he/she discover anything concerning or new on the feet. All questions answered. Discussed proper shoes as well.   Return in about 9 weeks (around 06/25/2023) for Diabetic Foot Care.         Eve Hinders, DPM [facility] [place]

## 2023-04-24 ENCOUNTER — Encounter: Payer: Self-pay | Admitting: Podiatry

## 2023-04-27 ENCOUNTER — Encounter: Payer: Self-pay | Admitting: Internal Medicine

## 2023-04-27 ENCOUNTER — Ambulatory Visit (INDEPENDENT_AMBULATORY_CARE_PROVIDER_SITE_OTHER): Payer: 59 | Admitting: Internal Medicine

## 2023-04-27 VITALS — BP 105/67 | HR 76

## 2023-04-27 DIAGNOSIS — Z794 Long term (current) use of insulin: Secondary | ICD-10-CM | POA: Diagnosis not present

## 2023-04-27 DIAGNOSIS — G8220 Paraplegia, unspecified: Secondary | ICD-10-CM

## 2023-04-27 DIAGNOSIS — N401 Enlarged prostate with lower urinary tract symptoms: Secondary | ICD-10-CM | POA: Insufficient documentation

## 2023-04-27 DIAGNOSIS — E7849 Other hyperlipidemia: Secondary | ICD-10-CM

## 2023-04-27 DIAGNOSIS — L989 Disorder of the skin and subcutaneous tissue, unspecified: Secondary | ICD-10-CM

## 2023-04-27 DIAGNOSIS — I890 Lymphedema, not elsewhere classified: Secondary | ICD-10-CM

## 2023-04-27 DIAGNOSIS — G4733 Obstructive sleep apnea (adult) (pediatric): Secondary | ICD-10-CM | POA: Diagnosis not present

## 2023-04-27 DIAGNOSIS — E119 Type 2 diabetes mellitus without complications: Secondary | ICD-10-CM | POA: Diagnosis not present

## 2023-04-27 DIAGNOSIS — R351 Nocturia: Secondary | ICD-10-CM

## 2023-04-27 MED ORDER — MEDICAL COMPRESSION STOCKINGS MISC: 2.0000 | Freq: Every day | 0 refills | Status: AC

## 2023-04-27 NOTE — Assessment & Plan Note (Addendum)
 POC A1c today has improved to 5.9.  He remains on Ozempic  1 mg weekly.  No medication changes are indicated today.

## 2023-04-27 NOTE — Assessment & Plan Note (Signed)
 He is currently prescribed rosuvastatin  20 mg daily.  Lipid panel last updated in October 2024 reflects adequate control on this regimen.

## 2023-04-27 NOTE — Patient Instructions (Signed)
 It was a pleasure to see you today.  Thank you for giving us  the opportunity to be involved in your care.  Below is a brief recap of your visit and next steps.  We will plan to see you again in 6 months.  Summary No medication changes today Repeat A1c Follow up in 6 months

## 2023-04-27 NOTE — Assessment & Plan Note (Signed)
 New orders placed today for electric wheelchair in the setting of paraplegia resulting from spinal cord injury at C5-C7.  Further complicated by chronic bilateral lower extremity lymphedema and congestive heart failure. -Due to lack of mobility Jordan Jensen is largely dependent for ADLs  Jordan Jensen has used manual wheelchair, but needs an electric wheelchair for better mobility in his home independently, which will improve his quality of life  Jordan Jensen can safely use electric wheelchair. Jordan Jensen is willing and motivated to use the power mobility device in the home

## 2023-04-27 NOTE — Assessment & Plan Note (Addendum)
 Symptoms are well-controlled with alfuzosin 

## 2023-04-27 NOTE — Assessment & Plan Note (Signed)
 New dermatology referral placed today at patient's request for evaluation of a scalp lesion.  There is a small, well-circumscribed, scabbed over lesion present on the posterior scalp.  He describes previously having this treated with cryotherapy but states that the lesion has returned.

## 2023-04-27 NOTE — Assessment & Plan Note (Signed)
 He endorses nightly compliance with BiPAP.  His concern today are dark marks on his face attributed to using his current mask.  I recommended reaching out to his DME company to discuss mask fitting.  Pulmonology follow-up scheduled for 6/16
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
## 2023-04-29 ENCOUNTER — Other Ambulatory Visit: Payer: Self-pay | Admitting: Internal Medicine

## 2023-04-29 DIAGNOSIS — I890 Lymphedema, not elsewhere classified: Secondary | ICD-10-CM

## 2023-04-29 NOTE — Telephone Encounter (Signed)
 duplicate

## 2023-04-30 ENCOUNTER — Encounter: Payer: Self-pay | Admitting: Internal Medicine

## 2023-04-30 LAB — BAYER DCA HB A1C WAIVED: HB A1C (BAYER DCA - WAIVED): 5.9 % — ABNORMAL HIGH (ref 4.8–5.6)

## 2023-05-02 ENCOUNTER — Other Ambulatory Visit: Payer: Self-pay | Admitting: Internal Medicine

## 2023-05-02 DIAGNOSIS — J4530 Mild persistent asthma, uncomplicated: Secondary | ICD-10-CM

## 2023-05-08 ENCOUNTER — Other Ambulatory Visit (HOSPITAL_COMMUNITY): Payer: Self-pay | Admitting: Cardiology

## 2023-05-08 ENCOUNTER — Ambulatory Visit (HOSPITAL_COMMUNITY)
Admission: RE | Admit: 2023-05-08 | Discharge: 2023-05-08 | Disposition: A | Discharge status: Home / Self Care | Source: Ambulatory Visit | Attending: Cardiology | Admitting: Cardiology

## 2023-05-08 ENCOUNTER — Other Ambulatory Visit (HOSPITAL_COMMUNITY): Payer: Self-pay | Admitting: *Deleted

## 2023-05-08 DIAGNOSIS — I340 Nonrheumatic mitral (valve) insufficiency: Secondary | ICD-10-CM | POA: Diagnosis not present

## 2023-05-08 DIAGNOSIS — I5020 Unspecified systolic (congestive) heart failure: Secondary | ICD-10-CM | POA: Insufficient documentation

## 2023-05-08 DIAGNOSIS — I5043 Acute on chronic combined systolic (congestive) and diastolic (congestive) heart failure: Secondary | ICD-10-CM

## 2023-05-08 LAB — ECHOCARDIOGRAM COMPLETE
Area-P 1/2: 4.06 cm2
Est EF: <20
MV M vel: 3.65 m/s
MV Peak grad: 53.3 mmHg

## 2023-05-08 NOTE — Progress Notes (Signed)
  Echocardiogram 2D Echocardiogram has been performed.  Dione Franks 05/08/2023, 4:11 PM

## 2023-05-09 ENCOUNTER — Other Ambulatory Visit: Payer: Self-pay | Admitting: Internal Medicine

## 2023-05-14 ENCOUNTER — Ambulatory Visit (HOSPITAL_COMMUNITY): Attending: Internal Medicine | Admitting: Occupational Therapy

## 2023-05-14 ENCOUNTER — Encounter (HOSPITAL_COMMUNITY): Payer: Self-pay | Admitting: Occupational Therapy

## 2023-05-14 DIAGNOSIS — Z993 Dependence on wheelchair: Secondary | ICD-10-CM | POA: Insufficient documentation

## 2023-05-14 DIAGNOSIS — G8254 Quadriplegia, C5-C7 incomplete: Secondary | ICD-10-CM | POA: Insufficient documentation

## 2023-05-14 NOTE — Therapy (Signed)
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 for ADL care  [] change position for pressure relief/cannot weight shift [] rest periods [] repositioning for transfers or clothing/diaper /catheter changes [] head positioning  [] Lighter weight required [] self- propulsion  [] lifting []                                                 [x] Heavy Duty required [x] user weight greater than 250# [] extreme tone/ over active movement [] broken frame on previous chair []                                     [x]  Back  []  Angle Adjustable []  Custom molded                        [x] postural control [x] control of tone/spasticity [] accommodation of range of motion [] UE functional control [x] accommodation for seating system []                                           [] provide lateral trunk support [] accommodate deformity [x] provide posterior trunk support [x] provide lumbar/sacral support [x] support trunk in midline [x] Pressure relief over spinal processes  [x]  Seat Cushion      *Spectrum Skin protection and positioning cushion                  [x] impaired sensation  [] decubitus ulcers present [x] history of pressure ulceration [x] prevent pelvic extension [x] low maintenance  [x] stabilize pelvis  [] accommodate obliquity [] accommodate multiple deformity [] neutralize lower extremity position [x] increase pressure distribution []                                           [x]  Pelvic/thigh support  [x]  Lateral thigh guide-large []  Distal medial pad  []  Distal lateral pad [x]  pelvis in neutral [x] accommodate pelvis [x]  position upper legs [x]  alignment []  accommodate ROM []  decrease adduction [x] accommodate tone [] removable for transfers [x] decrease abduction  []  Lateral trunk Supports []  Lt     []  Rt [] decrease lateral trunk leaning [] control tone [] contour for increased contact [] safety  [] accommodate asymmetry []                                                 [x]  Mounting hardware  [] lateral trunk supports  [] back   [] seat [x] Headrest-Large with adjustable hardware     []  thigh support [] fixed   [] swing away [] attach seat platform/cushion to w/c frame [] attach back cushion to w/c frame [] mount postural supports [x] mount headrest  [] swing medial thigh support away [] swing lateral supports away for transfers  []   Armrests  [] fixed [x] adjustable height [] removable   [] swing away  [] flip back   [] reclining [x] full length pads [] desk    [] pads tubular  [x] provide support with elbow at 90   [] provide support for w/c tray [x] change of height/angles for variable activities [] remove for transfers [x] allow to come closer to table top [] remove for access to tables []                                                Hangers/ Leg rests  [] 60 [] 70 [] 90  [x] elevating [] heavy duty  [x] articulating [] fixed [] lift off [] swing away      [x] power [x] provide LE support  [x] accommodate to hamstring tightness [x] elevate legs during recline   [x] provide change in position for Legs [x] Maintain placement of feet on footplate [] durability [x] enable transfers [x] decrease edema [] Accommodate lower leg length []                                         Foot support Footplate    [] Lt  []  Rt  [x]  Center mount [] flip up                         [x] depth/angle adjustable [] Amputee adapter    []  Lt     []  Rt [] provide foot support [] accommodate to ankle ROM [] transfers [] Provide support for residual extremity []  allow foot to go under wheelchair base []  decrease tone  []                                                 []  Ankle strap/heel loops [] support foot on foot support [] decrease extraneous movement [] provide input to heel  [] protect foot  Tires: [] pneumatic  [x] flat free inserts  [] solid  [x] decrease maintenance  [x] prevent frequent flats [x] increase shock absorbency [] decrease pain from road shock [x] decrease spasms from road shock []                                              [x]  Headrest  [x] provide posterior head support [x] provide posterior neck support [] provide lateral head support [] provide anterior head support [] support during tilt and recline [] improve feeding   [] improve respiration [] placement of switches [x] safety  [] accommodate ROM  [] accommodate tone [] improve visual orientation  []  Anterior chest strap []  Vest []  Shoulder retractors  [] decrease forward movement of shoulder [] accommodation of TLSO [] decrease forward movement of trunk [] decrease shoulder elevation [] added abdominal support [] alignment [] assistance with shoulder control  []                                               Pelvic Positioner [x] Belt [] SubASIS bar [] Dual  Pull [] stabilize tone [x] decrease falling out of chair/ **will not Decrease potential for sliding due to pelvic tilting [] prevent excessive rotation [] pad for protection over boney prominence [] prominence comfort [] special pull angle to control rotation []   Upper ExtremitySupport  [] L   []  R [] Arm trough   [] hand support []  tray       [] full tray [] swivel mount [] decrease edema      [] decrease subluxation   [] control tone   [] placement for AAC/Computer/EADL [] decrease gravitational pull on shoulders [] provide midline positioning [] provide support to increase UE function [] provide hand support in natural position [] provide work surface   POWER WHEELCHAIR CONTROLS  [x] Proportional  [] Non-Proportional Type                                      [] Left  [x] Right [x] provides access for controlling wheelchair   [] lacks motor control to operate proportional drive control [] unable to understand proportional controls  Actuator Control Module  [] Single  [x] Multiple  *expandable controller harness [x] Allow the client to operate the power seat function(s) through the joystick control   [] Safety Reset Switches [] Used to change modes and stop the wheelchair when driving in latch mode    [] Upgraded Electronics   [] programming for accurate control [] progressive Disease/changing condition [] non-proportional drive control needed [] Needed in order to operate power seat functions through joystick control   [] Display box [] Allows user to see in which mode and drive the wheelchair is set  [] necessary for alternate controls    [] Digital interface electronics [] Allows w/c to operate when using alternative drive controls  [] ASL Head Array [] Allows client to operate wheelchair  through switches placed in tri-panel headrest  [] Sip and puff with tubing kit [] needed to operate sip and puff drive controls  [] Upgraded tracking electronics [] increase safety when  driving [] correct tracking when on uneven surfaces  [x] Mount for switches or joystick [] Attaches switches to w/c  [x] Swing away for access or transfers [] midline for optimal placement [] provides for consistent access  [] Attendant controlled joystick plus mount [] safety [] long distance driving [] operation of seat functions [] compliance with transportation regulations []                                             Rear wheel placement/Axle adjustability [] None [] semi adjustable [] fully adjustable  [] improved UE access to wheels [] improved stability [] changing angle in space for improvement of postural stability [] 1-arm drive access [] amputee pad placement []                                Wheel rims/ hand rims  [] metal   [] plastic coated [] oblique projections           [] vertical projections [] Provide ability to propel manual wheelchair  []  Increase self-propulsion with hand weakness/decreased grasp  Push handles [] extended   [] angle adjustable              [] standard [] caregiver access [] caregiver assist [] allows "hooking" to enable increased ability to perform ADLs or maintain balance  One armed device   [] Lt   [] Rt [] enable propulsion of manual wheelchair with one arm   []                                            Brake/wheel lock extension []  Lt   []  Rt [] increase indep in applying wheel locks   [] Side guards []   prevent clothing getting caught in wheel or becoming soiled []  prevent skin tears/abrasions  Battery: [employer]                                             [x] to power wheelchair                                                         Other:                                                       The above equipment has a life- long use expectancy. Growth and changes in medical and/or functional conditions would be the exceptions. This is to certify that the therapist has no financial relationship with durable medical provider or manufacturer. The therapist will not receive  remuneration of any kind for the equipment recommended in this evaluation.   Patient has mobility limitation that significantly impairs safe, timely participation in one or more mobility related ADL's. (bathing, toileting, feeding, dressing, grooming, moving from room to room)  [x]  Yes []  No  Will mobility device sufficiently improve ability to participate and/or be aided in participation of MRADL's?      [x]  Yes []  No  Can limitation be compensated for with use of a cane or walker?                                    []  Yes [x]  No  Does patient or caregiver demonstrate ability/potential ability & willingness to safely use the mobility device?    [x]  Yes []  No  Does patient's home environment support use of recommended mobility device?            [x]  Yes []  No  Does patient have sufficient upper extremity function necessary to functionally propel a manual wheelchair?     []  Yes [x]  No  Does patient have sufficient strength and trunk stability to safely operate a POV (scooter)?                                  []  Yes [x]  No  Does patient need additional features/benefits provided by a power wheelchair for MRADL's in the home?        [x]  Yes []  No  Does the patient demonstrate the ability to safely use a power wheelchair?                   [x]  Yes []  No     Physician's Name Printed:                                                        Physician's Signature:  Date:     This is to  certify that I, the above signed therapist have the following affiliations: []  This DME provider []  Manufacturer of recommended equipment []  Patient's long term care facility [x]  None of the above  Therapist Name/Signature:                                            Date:  ASSESSMENT:  CLINICAL IMPRESSION: Patient is a 53 y.o. male who was seen today for physical therapy evaluation and treatment for a power wheelchair evaluation. Pt has PMH significant for C5-C7 SCI in 2014, myelopathy, HTN, CHF,  lymphedema, and ashthma. Pt presents in a bariatric manual wheelchair, with posterior pelvic tilt and sacral sitting. Pt reports his wheelchair fits through the doorway of his home, however once inside he is unable to access his bathroom or bedroom due to the chair being too large to fit through the doorways. Pt presents with limited BLE strength-decreased from previous evaluation in 2024, and decreased BUE strength. He is dependent on a hoyer lift for all transfers, is non-ambulatory and is unable to stand or slide for transfers. Wife, daughter, and nursing aide provide significant assistance for ADL completion due to pt's severely limited mobility preventing independence and limiting participation. Pt is at high risk for pressure ulcers due to inability to effectively weight shift or perform pressure relief, and developed a pressure ulcer in November 2024 after prior wheelchair request was denied. Pt also with spasticity and lymphedema in BLE. Pt will benefit from recommended power wheelchair to improve ability to access his home environment, participate in ADL tasks and transfer tasks with improved independence, and decrease caregiver burden.    OBJECTIVE IMPAIRMENTS: decreased activity tolerance, decreased balance, decreased endurance, decreased mobility, difficulty walking, increased muscle spasms, impaired flexibility, impaired sensation, impaired tone, impaired UE functional use, and obesity.   ACTIVITY LIMITATIONS: sitting, standing, transfers, bed mobility, bathing, toileting, dressing, reach over head, hygiene/grooming, and locomotion level  PARTICIPATION LIMITATIONS: meal prep, cleaning, laundry, interpersonal relationship, driving, shopping, and community activity  PERSONAL FACTORS: Time since onset of injury/illness/exacerbation and Transportation are also affecting patient's functional outcome.   REHAB POTENTIAL: Good  CLINICAL DECISION MAKING: Stable/uncomplicated  EVALUATION  COMPLEXITY: Low   Lafonda Piety, OTR/L  [phone] 05/14/2023, 2:22 PM

## 2023-05-25 DIAGNOSIS — G4733 Obstructive sleep apnea (adult) (pediatric): Secondary | ICD-10-CM | POA: Diagnosis not present

## 2023-05-26 ENCOUNTER — Ambulatory Visit (INDEPENDENT_AMBULATORY_CARE_PROVIDER_SITE_OTHER): Admitting: Podiatry

## 2023-05-26 ENCOUNTER — Encounter: Payer: Self-pay | Admitting: Podiatry

## 2023-05-26 DIAGNOSIS — L03032 Cellulitis of left toe: Secondary | ICD-10-CM | POA: Diagnosis not present

## 2023-05-26 MED ORDER — SULFAMETHOXAZOLE-TRIMETHOPRIM 800-160 MG PO TABS: 1.0000 | ORAL_TABLET | Freq: Two times a day (BID) | ORAL | 1 refills | Status: DC

## 2023-05-26 NOTE — Patient Instructions (Signed)
 Betadine Soak Instructions  Purchase an 8 oz. bottle of BETADINE solution (Povidone)  THE DAY AFTER THE PROCEDURE  Place 1 tablespoon of betadine solution in a quart of warm tap water.  Submerge your foot or feet with outer bandage intact for the initial soak; this will allow the bandage to become moist and wet for easy lift off.  Once you remove your bandage, continue to soak in the solution for 20 minutes.  This soak should be done twice a day.  Next, remove your foot or feet from solution, blot dry the affected area and cover.  You may use a band aid large enough to cover the area or use gauze and tape.  Apply other medications to the area as directed by the doctor such as cortisporin otic solution (ear drops) or neosporin.  IF YOUR SKIN BECOMES IRRITATED WHILE USING THESE INSTRUCTIONS, IT IS OKAY TO SWITCH TO EPSOM SALTS AND WATER OR WHITE VINEGAR AND WATER.

## 2023-05-26 NOTE — Progress Notes (Signed)
 He presents today for follow-up of his paronychia repair left.  He states is just not improving is or anything else that we can do.  Objective: Vital signs are stable oriented x 3 the large granuloma the lateral aspect of the nail border left.  Purulence and malodor.  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  Plan: An I&D and a partial temporary nail avulsion was performed to the lateral foot today after local anesthetic was administered to the hallux.  The nail was split from distal to proximal along the lateral portion and removed.  All necrotic and infectious tissue was sharply resected.  Silvadene cream down sterile compressive dressing was applied he was also prescribed Bactrim  DS and I will follow-up with him in about 2 weeks to make sure he is doing well.  He was given both oral written home-going instruction for the soaking and care of the foot.  Questions or concerns he will directed toward us .

## 2023-05-28 ENCOUNTER — Other Ambulatory Visit (HOSPITAL_COMMUNITY): Payer: Self-pay | Admitting: Cardiology

## 2023-05-31 ENCOUNTER — Other Ambulatory Visit: Payer: Self-pay | Admitting: Internal Medicine

## 2023-05-31 ENCOUNTER — Other Ambulatory Visit (HOSPITAL_COMMUNITY): Payer: Self-pay | Admitting: Physician Assistant

## 2023-06-16 ENCOUNTER — Ambulatory Visit (INDEPENDENT_AMBULATORY_CARE_PROVIDER_SITE_OTHER): Admitting: Podiatry

## 2023-06-16 ENCOUNTER — Other Ambulatory Visit: Payer: Self-pay | Admitting: Internal Medicine

## 2023-06-16 DIAGNOSIS — B351 Tinea unguium: Secondary | ICD-10-CM

## 2023-06-16 DIAGNOSIS — E119 Type 2 diabetes mellitus without complications: Secondary | ICD-10-CM

## 2023-06-16 DIAGNOSIS — M79674 Pain in right toe(s): Secondary | ICD-10-CM | POA: Diagnosis not present

## 2023-06-16 DIAGNOSIS — M79675 Pain in left toe(s): Secondary | ICD-10-CM

## 2023-06-16 DIAGNOSIS — L03032 Cellulitis of left toe: Secondary | ICD-10-CM

## 2023-06-16 NOTE — Progress Notes (Signed)
 He presents today chief concern for follow-up of his matrixectomy fibular border hallux left.  States that he is feeling good and looks good.  Other toenails are starting to bother me with shoes and socks and ambulation.  Objective: Vital signs are stable alert oriented x 3 pulses are palpable.  There is no erythema cellulitis drainage or odor to the fibular border of the hallux left.  His toenails are thick yellow dystrophic clinically mycotic sharply incurvated painful palpation as well as debridement.  Assessment: Well-healing surgical toe hallux left.  Nail dystrophy with mycosis and pain and is nonambulatory.  Plan: Debridement of toenails 1 through 5 bilateral and covered the surgical site with a Band-Aid.

## 2023-06-17 ENCOUNTER — Encounter: Payer: Self-pay | Admitting: Primary Care

## 2023-06-17 ENCOUNTER — Ambulatory Visit: Admitting: Primary Care

## 2023-06-17 ENCOUNTER — Telehealth: Payer: Self-pay | Admitting: Primary Care

## 2023-06-17 ENCOUNTER — Ambulatory Visit (INDEPENDENT_AMBULATORY_CARE_PROVIDER_SITE_OTHER): Admitting: Primary Care

## 2023-06-17 ENCOUNTER — Telehealth: Payer: Self-pay | Admitting: Internal Medicine

## 2023-06-17 VITALS — BP 148/82 | HR 83 | Ht 71.0 in | Wt 345.0 lb

## 2023-06-17 DIAGNOSIS — I890 Lymphedema, not elsewhere classified: Secondary | ICD-10-CM

## 2023-06-17 DIAGNOSIS — J4530 Mild persistent asthma, uncomplicated: Secondary | ICD-10-CM

## 2023-06-17 DIAGNOSIS — I5020 Unspecified systolic (congestive) heart failure: Secondary | ICD-10-CM

## 2023-06-17 DIAGNOSIS — G4733 Obstructive sleep apnea (adult) (pediatric): Secondary | ICD-10-CM | POA: Diagnosis not present

## 2023-06-17 MED ORDER — DOXYCYCLINE HYCLATE 100 MG PO TABS: 100.0000 mg | ORAL_TABLET | Freq: Two times a day (BID) | ORAL | 0 refills | Status: DC

## 2023-06-17 MED ORDER — PREDNISONE 10 MG PO TABS: ORAL_TABLET | ORAL | 0 refills | Status: DC

## 2023-06-17 NOTE — Telephone Encounter (Signed)
 Paperwork handed back to front, form needs to be filled out by PT who did the wheelchair evaluation
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
## 2023-06-17 NOTE — Telephone Encounter (Signed)
 Referral to outpatient lymphedema clinic in [facility]  preferably or [place]

## 2023-06-17 NOTE — Telephone Encounter (Signed)
 Faxed all paperwork back to [employer] to fax to the physical therapist

## 2023-06-17 NOTE — Telephone Encounter (Signed)
 Power wheelchair form   Copied Noted Sleeved  Original placed in PCP box Copy front desk folder

## 2023-06-17 NOTE — Patient Instructions (Addendum)
  VISIT SUMMARY: Today, you came in for a follow-up visit to discuss your ongoing health conditions, including obesity hypoventilation syndrome, obstructive sleep apnea, asthma, and heart failure. We reviewed your current treatments and made some adjustments to help manage your symptoms better.  YOUR PLAN: -OBESITY HYPOVENTILATION SYNDROME: Obesity hypoventilation syndrome is a condition where poor breathing due to obesity leads to low oxygen  and high carbon dioxide levels in the blood. You are using BiPAP therapy consistently, which has improved your daytime alertness. However, you have been experiencing sores and bruising from your current CPAP mask. We will transition you to a ResMed Airtouch F20 full face mask with foam to alleviate these issues.  -OBSTRUCTIVE SLEEP APNEA: Obstructive sleep apnea is a condition where the airway becomes blocked during sleep, causing breathing to stop and start repeatedly. You are managing this with BiPAP therapy and have reported consistent use and improvement in daytime sleepiness. There have been no recent hospitalizations related to sleep apnea.  -ASTHMA: Asthma is a condition where the airways become inflamed and narrow, making it difficult to breathe. You have experienced a recent increase in wheezing and mucus production due to a cold. We have sent a refill for your generic Symbicort  inhaler and prescribed a prednisone  taper and doxycycline  to manage the acute bronchitis and prevent further complications.  -ACUTE BRONCHITIS: Acute bronchitis is an inflammation of the airways in the lungs, usually due to an infection, leading to increased mucus production and wheezing. Given your history of respiratory issues, we have prescribed doxycycline  to prevent hospitalization and advised you to avoid sun exposure while on this medication.  -HEART FAILURE WITH REDUCED EJECTION FRACTION: Heart failure with reduced ejection fraction means your heart is not pumping blood as  well as it should. Your recent echocardiogram shows worsening heart failure. You are currently on torsemide , spironolactone , and potassium replacement therapy. We will coordinate with your cardiologist for further management.  -PREMATURE VENTRICULAR CONTRACTIONS: Premature ventricular contractions are extra heartbeats that disrupt your regular heart rhythm. You are managing this with low dose Toprolol and have reported reduced symptoms and improved condition. Cardiac ablation was not pursued due to candidacy concerns.  -LYMPHEDEMA: Lymphedema is swelling that generally occurs in one of your arms or legs, often due to lymph node removal or damage. You have requested a referral to a lymphedema clinic in [place] or [place] for better accessibility. We will refer you to [facility] outpatient rehabilitation for treatment.  INSTRUCTIONS: Please follow up with your cardiologist regarding your recent echocardiogram results. Additionally, schedule a follow-up with your primary care provider to discuss the potential adjustment of your Ozempic  dosage. We will also coordinate with the lymphedema clinic in [facility] for your treatment. Continue to wear BIPAP nightly 4-6 hours or longer. Notify us  if you have trouble getting new mask.   FU  6 months with Beth NP or sooner if needed

## 2023-06-19 ENCOUNTER — Telehealth: Payer: Self-pay | Admitting: Primary Care

## 2023-06-19 NOTE — Telephone Encounter (Signed)
 Please follow-up with sam regarding CPAP compliance report, patient brought in SD card to visit

## 2023-06-25 ENCOUNTER — Ambulatory Visit: Admitting: Podiatry

## 2023-06-25 DIAGNOSIS — G4733 Obstructive sleep apnea (adult) (pediatric): Secondary | ICD-10-CM | POA: Diagnosis not present

## 2023-06-26 ENCOUNTER — Ambulatory Visit: Admitting: Podiatry

## 2023-07-07 ENCOUNTER — Telehealth: Payer: Self-pay

## 2023-07-07 NOTE — Telephone Encounter (Signed)
 Copied from CRM [id number]. Topic: Clinical - Medication Question >> Jul 06, 2023 11:28 AM Rozanna MATSU wrote: Reason for CRM: PT SPOUSE CALLED STATED PT LEFT HIS SD CARD FOR HIS BIPAP MACHINE WITH PROVIDER AND STATED THE PROVIDER WAS GOING TO MAIL THE  CARD BACK BUT STATED ITS BEEN THREE WEEKS. PLS ADVISE AND CONTACT PT WITH INFORMATION  ATC x1 LMTCB

## 2023-07-09 ENCOUNTER — Other Ambulatory Visit: Payer: Self-pay | Admitting: Internal Medicine

## 2023-07-09 DIAGNOSIS — E119 Type 2 diabetes mellitus without complications: Secondary | ICD-10-CM

## 2023-07-09 NOTE — Telephone Encounter (Signed)
 Spoke with pt, he sated his daughter can come pick up is SD card next week and if she cat he will call me and let me know and we will find a way to get it to him. Me and Ms. K where afraid of sending through mail bc it could get lost but we will do what we can. NFN atm

## 2023-07-15 ENCOUNTER — Telehealth: Payer: Self-pay

## 2023-07-15 NOTE — Telephone Encounter (Signed)
 Spoke with pt so that he is aware is SD card is waiting up front for hime, pt states he has not forgot he will get his daughter to come pick it up for him. NFN

## 2023-07-22 ENCOUNTER — Ambulatory Visit (INDEPENDENT_AMBULATORY_CARE_PROVIDER_SITE_OTHER): Payer: Self-pay | Admitting: Internal Medicine

## 2023-07-22 ENCOUNTER — Encounter: Payer: Self-pay | Admitting: Internal Medicine

## 2023-07-22 VITALS — BP 138/84 | HR 60 | Ht 71.0 in | Wt 349.0 lb

## 2023-07-22 DIAGNOSIS — G4733 Obstructive sleep apnea (adult) (pediatric): Secondary | ICD-10-CM | POA: Diagnosis not present

## 2023-07-22 DIAGNOSIS — G8220 Paraplegia, unspecified: Secondary | ICD-10-CM

## 2023-07-22 DIAGNOSIS — I10 Essential (primary) hypertension: Secondary | ICD-10-CM | POA: Diagnosis not present

## 2023-07-22 DIAGNOSIS — I5020 Unspecified systolic (congestive) heart failure: Secondary | ICD-10-CM

## 2023-07-22 NOTE — Patient Instructions (Signed)
Please continue to take medications as prescribed.  Please continue to follow low carb diet.

## 2023-07-22 NOTE — Assessment & Plan Note (Signed)
 Uses BiPAP at nighttime Followed by pulmonology
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
## 2023-07-22 NOTE — Assessment & Plan Note (Signed)
 Due to posttraumatic myelopathy and paraplegia Dependent for ADLs  Has used manual wheelchair, but needs an electric wheelchair for better mobility in her home independently, which will improve her quality of life.  He is currently dependent for propelling manual wheelchair.  He can safely use electric wheelchair. He is willing and motivated to use the power mobility device in the home.  Electric wheelchair form sent to [employer] and mobility with OT evaluation form

## 2023-07-22 NOTE — Assessment & Plan Note (Signed)
 BP Readings from Last 1 Encounters:  07/22/23 138/84   Well-controlled with losartan , metoprolol , spironolactone  and torsemide  Advised DASH diet

## 2023-07-22 NOTE — Assessment & Plan Note (Signed)
 He has chronic bilateral leg swelling On torsemide  40 mg BID and spironolactone  25 mg QD On losartan  25 mg QD and metoprolol  12.5 mg QD Followed by cardiology

## 2023-08-07 ENCOUNTER — Other Ambulatory Visit: Payer: Self-pay

## 2023-08-07 DIAGNOSIS — G4733 Obstructive sleep apnea (adult) (pediatric): Secondary | ICD-10-CM | POA: Diagnosis not present

## 2023-08-07 DIAGNOSIS — E119 Type 2 diabetes mellitus without complications: Secondary | ICD-10-CM

## 2023-08-15 ENCOUNTER — Other Ambulatory Visit: Payer: Self-pay | Admitting: Internal Medicine

## 2023-08-15 DIAGNOSIS — J069 Acute upper respiratory infection, unspecified: Secondary | ICD-10-CM

## 2023-08-15 DIAGNOSIS — J4530 Mild persistent asthma, uncomplicated: Secondary | ICD-10-CM

## 2023-08-21 ENCOUNTER — Telehealth: Admitting: Family Medicine

## 2023-08-21 DIAGNOSIS — J302 Other seasonal allergic rhinitis: Secondary | ICD-10-CM | POA: Diagnosis not present

## 2023-08-21 MED ORDER — IPRATROPIUM BROMIDE 0.03 % NA SOLN: 2.0000 | Freq: Two times a day (BID) | NASAL | 12 refills | Status: AC

## 2023-08-21 NOTE — Patient Instructions (Addendum)
 Jordan Jensen, thank you for joining Olam DELENA Darby, FNP for today's virtual visit.  While this provider is not your primary care provider (PCP), if your PCP is located in our provider database this encounter information will be shared with them immediately following your visit.   A [place] MyChart account gives you access to today's visit and all your visits, tests, and labs performed at [facility]  click here if you don't have a [place] MyChart account or go to [email or web address]  Consent: (Patient) Jordan Jensen provided verbal consent for this virtual visit at the beginning of the encounter.  Current Medications:  Current Outpatient Medications:    ipratropium (ATROVENT ) 0.03 % nasal spray, Place 2 sprays into both nostrils every 12 (twelve) hours., Disp: 30 mL, Rfl: 12   acetaminophen  (TYLENOL ) 500 MG tablet, Take 500 mg by mouth every 6 (six) hours as needed for moderate pain., Disp: , Rfl:    albuterol  (PROVENTIL ) (2.5 MG/3ML) 0.083% nebulizer solution, Take 3 mLs (2.5 mg total) by nebulization every 6 (six) hours as needed for wheezing or shortness of breath., Disp: 150 mL, Rfl: 1   alclomethasone (ACLOVATE) 0.05 % cream, Apply topically 2 (two) times daily as needed., Disp: , Rfl:    alfuzosin  (UROXATRAL ) 10 MG 24 hr tablet, Take 1 tablet (10 mg total) by mouth at bedtime., Disp: 30 tablet, Rfl: 11   amiodarone  (PACERONE ) 200 MG tablet, Take 1 tablet (200 mg total) by mouth 2 (two) times daily for 14 days, THEN 1 tablet (200 mg total) daily. (Patient taking differently: Take 1 tablet (200 mg total) by mouth 2 (two) times daily for 14 days, THEN 1 tablet (200 mg total) daily. Patient is taking 1 tablet a day.), Disp: 104 tablet, Rfl: 3   azelastine  (ASTELIN ) 0.1 % nasal spray, Place 1 spray into both nostrils 2 (two) times daily. Use in each nostril as directed, Disp: 30 mL, Rfl: 0   benzonatate  (TESSALON ) 100 MG capsule, Take 1 capsule (100 mg total) by mouth 3  (three) times daily as needed for cough., Disp: 30 capsule, Rfl: 0   BREYNA  160-4.5 MCG/ACT inhaler, INHALE 2 PUFFS BY MOUTH IN THE MORNING AND AT BEDTIME, Disp: 11 g, Rfl: 0   doxycycline  (VIBRA -TABS) 100 MG tablet, Take 1 tablet (100 mg total) by mouth 2 (two) times daily., Disp: 14 tablet, Rfl: 0   Elastic Bandages & Supports (MEDICAL COMPRESSION STOCKINGS) MISC, 2 each by Does not apply route daily., Disp: 2 each, Rfl: 0   fluticasone  (FLONASE ) 50 MCG/ACT nasal spray, Use 2 spray(s) in each nostril once daily, Disp: 16 g, Rfl: 0   losartan  (COZAAR ) 25 MG tablet, Take 1 tablet by mouth once daily, Disp: 90 tablet, Rfl: 0   metoprolol  succinate (TOPROL  XL) 25 MG 24 hr tablet, Take 0.5 tablets (12.5 mg total) by mouth daily., Disp: 50 tablet, Rfl: 3   montelukast  (SINGULAIR ) 10 MG tablet, TAKE 1 TABLET BY MOUTH AT BEDTIME, Disp: 30 tablet, Rfl: 0   OZEMPIC , 1 MG/DOSE, 4 MG/3ML SOPN, INJECT 1 MG SUBCUTANEOUSLY  ONCE A WEEK, Disp: 3 mL, Rfl: 0   potassium chloride  SA (KLOR-CON  M) 20 MEQ tablet, Take 1 tablet (20 mEq total) by mouth 2 (two) times daily., Disp: 60 tablet, Rfl: 3   predniSONE  (DELTASONE ) 10 MG tablet, 4 tabs for 2 days, then 3 tabs for 2 days, 2 tabs for 2 days, then 1 tab for 2 days, then stop, Disp: 20 tablet, Rfl: 0  rosuvastatin  (CRESTOR ) 20 MG tablet, Take 1 tablet by mouth once daily, Disp: 90 tablet, Rfl: 0   spironolactone  (ALDACTONE ) 25 MG tablet, Take 1 tablet by mouth once daily, Disp: 90 tablet, Rfl: 0   torsemide  (DEMADEX ) 20 MG tablet, Take 2 tablets by mouth twice daily, Disp: 120 tablet, Rfl: 0   Medications ordered in this encounter:  Meds ordered this encounter  Medications   ipratropium (ATROVENT ) 0.03 % nasal spray    Sig: Place 2 sprays into both nostrils every 12 (twelve) hours.    Dispense:  30 mL    Refill:  12    Supervising Provider:   BLAISE ALEENE KIDD [[id number]]     *If you need refills on other medications prior to your next appointment, please  contact your pharmacy*  Follow-Up: Call back or seek an in-person evaluation if the symptoms worsen or if the condition fails to improve as anticipated.  [facility] [phone]  Other Instructions Start Atrovent  nasal spray today to decrease drainage Continue Singulair  and Symbicort  as prescribed. Discussed using albuterol  nebulizer when possible to help thin drainage and open up lungs to help cough mucus up. Discussed symptoms that would be concerning that would require further evaluation.   If you have been instructed to have an in-person evaluation today at a local Urgent Care facility, please use the link below. It will take you to a list of all of our available [facility], including address, phone number and hours of operation. Please do not delay care.  [facility]  If you or a family member do not have a primary care provider, use the link below to schedule a visit and establish care. When you choose a [facility] primary care physician or advanced practice provider, you gain a long-term partner in health. Find a Primary Care Provider  Learn more about [facility]'s in-office and virtual care options: [place] [facility] - Get Care Now
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
## 2023-08-24 ENCOUNTER — Other Ambulatory Visit: Payer: Self-pay | Admitting: Nurse Practitioner

## 2023-08-24 DIAGNOSIS — J069 Acute upper respiratory infection, unspecified: Secondary | ICD-10-CM

## 2023-08-26 ENCOUNTER — Telehealth: Admitting: Physician Assistant

## 2023-08-26 DIAGNOSIS — J329 Chronic sinusitis, unspecified: Secondary | ICD-10-CM

## 2023-08-26 DIAGNOSIS — J4541 Moderate persistent asthma with (acute) exacerbation: Secondary | ICD-10-CM

## 2023-08-26 MED ORDER — BENZONATATE 100 MG PO CAPS: 100.0000 mg | ORAL_CAPSULE | Freq: Three times a day (TID) | ORAL | 0 refills | Status: DC | PRN

## 2023-08-26 MED ORDER — DOXYCYCLINE HYCLATE 100 MG PO TABS
100.0000 mg | ORAL_TABLET | Freq: Two times a day (BID) | ORAL | 0 refills | Status: DC
Start: 2023-08-26 — End: 2023-10-09

## 2023-08-26 MED ORDER — AMOXICILLIN-POT CLAVULANATE 875-125 MG PO TABS: 1.0000 | ORAL_TABLET | Freq: Two times a day (BID) | ORAL | 0 refills | Status: AC
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
## 2023-08-26 NOTE — Progress Notes (Signed)

## 2023-08-26 NOTE — Patient Instructions (Signed)
 Jordan Jensen, thank you for joining Lovette Borg, PA-C for today's virtual visit.  While this provider is not your primary care provider (PCP), if your PCP is located in our provider database this encounter information will be shared with them immediately following your visit.   A [place] MyChart account gives you access to today's visit and all your visits, tests, and labs performed at [facility]  click here if you don't have a [facility] MyChart account or go to [email or web address]  Consent: (Patient) Jordan Jensen provided verbal consent for this virtual visit at the beginning of the encounter.  Current Medications:  Current Outpatient Medications:    amoxicillin -clavulanate (AUGMENTIN ) 875-125 MG tablet, Take 1 tablet by mouth 2 (two) times daily for 5 days., Disp: 10 tablet, Rfl: 0   acetaminophen  (TYLENOL ) 500 MG tablet, Take 500 mg by mouth every 6 (six) hours as needed for moderate pain., Disp: , Rfl:    albuterol  (PROVENTIL ) (2.5 MG/3ML) 0.083% nebulizer solution, Take 3 mLs (2.5 mg total) by nebulization every 6 (six) hours as needed for wheezing or shortness of breath., Disp: 150 mL, Rfl: 1   alclomethasone (ACLOVATE) 0.05 % cream, Apply topically 2 (two) times daily as needed., Disp: , Rfl:    alfuzosin  (UROXATRAL ) 10 MG 24 hr tablet, Take 1 tablet (10 mg total) by mouth at bedtime., Disp: 30 tablet, Rfl: 11   amiodarone  (PACERONE ) 200 MG tablet, Take 1 tablet (200 mg total) by mouth 2 (two) times daily for 14 days, THEN 1 tablet (200 mg total) daily. (Patient taking differently: Take 1 tablet (200 mg total) by mouth 2 (two) times daily for 14 days, THEN 1 tablet (200 mg total) daily. Patient is taking 1 tablet a day.), Disp: 104 tablet, Rfl: 3   azelastine  (ASTELIN ) 0.1 % nasal spray, Place 1 spray into both nostrils 2 (two) times daily. Use in each nostril as directed, Disp: 30 mL, Rfl: 0   benzonatate  (TESSALON ) 100 MG capsule, Take 1-2 capsules (100-200 mg  total) by mouth 3 (three) times daily as needed., Disp: 30 capsule, Rfl: 0   BREYNA  160-4.5 MCG/ACT inhaler, INHALE 2 PUFFS BY MOUTH IN THE MORNING AND AT BEDTIME, Disp: 11 g, Rfl: 0   doxycycline  (VIBRA -TABS) 100 MG tablet, Take 1 tablet (100 mg total) by mouth 2 (two) times daily., Disp: 14 tablet, Rfl: 0   Elastic Bandages & Supports (MEDICAL COMPRESSION STOCKINGS) MISC, 2 each by Does not apply route daily., Disp: 2 each, Rfl: 0   fluticasone  (FLONASE ) 50 MCG/ACT nasal spray, Use 2 spray(s) in each nostril once daily, Disp: 16 g, Rfl: 0   ipratropium (ATROVENT ) 0.03 % nasal spray, Place 2 sprays into both nostrils every 12 (twelve) hours., Disp: 30 mL, Rfl: 12   losartan  (COZAAR ) 25 MG tablet, Take 1 tablet by mouth once daily, Disp: 90 tablet, Rfl: 0   metoprolol  succinate (TOPROL  XL) 25 MG 24 hr tablet, Take 0.5 tablets (12.5 mg total) by mouth daily., Disp: 50 tablet, Rfl: 3   montelukast  (SINGULAIR ) 10 MG tablet, TAKE 1 TABLET BY MOUTH AT BEDTIME, Disp: 30 tablet, Rfl: 0   OZEMPIC , 1 MG/DOSE, 4 MG/3ML SOPN, INJECT 1 MG SUBCUTANEOUSLY  ONCE A WEEK, Disp: 3 mL, Rfl: 0   potassium chloride  SA (KLOR-CON  M) 20 MEQ tablet, Take 1 tablet (20 mEq total) by mouth 2 (two) times daily., Disp: 60 tablet, Rfl: 3   predniSONE  (DELTASONE ) 10 MG tablet, 4 tabs for 2 days, then 3 tabs  for 2 days, 2 tabs for 2 days, then 1 tab for 2 days, then stop, Disp: 20 tablet, Rfl: 0   rosuvastatin  (CRESTOR ) 20 MG tablet, Take 1 tablet by mouth once daily, Disp: 90 tablet, Rfl: 0   spironolactone  (ALDACTONE ) 25 MG tablet, Take 1 tablet by mouth once daily, Disp: 90 tablet, Rfl: 0   torsemide  (DEMADEX ) 20 MG tablet, Take 2 tablets by mouth twice daily, Disp: 120 tablet, Rfl: 0   Medications ordered in this encounter:  Meds ordered this encounter  Medications   amoxicillin -clavulanate (AUGMENTIN ) 875-125 MG tablet    Sig: Take 1 tablet by mouth 2 (two) times daily for 5 days.    Dispense:  10 tablet    Refill:  0     Supervising Provider:   BLAISE ALEENE KIDD [[id number]]     *If you need refills on other medications prior to your next appointment, please contact your pharmacy*  Follow-Up: Call back or seek an in-person evaluation if the symptoms worsen or if the condition fails to improve as anticipated.  [facility] [phone]  Other Instructions Increase fluids Continue with otc antihistamine Continue with inhaler and nebulizer solution Continue with Tessalon  as previously prescribed. If no improvement in 3-5 days then start medicine, Augmentin , as prescribed. Schedule a virtual appointment or follow up at an urgent care clinic if symptoms don't improve.    If you have been instructed to have an in-person evaluation today at a local Urgent Care facility, please use the link below. It will take you to a list of all of our available [facility], including address, phone number and hours of operation. Please do not delay care.  [facility]  If you or a family member do not have a primary care provider, use the link below to schedule a visit and establish care. When you choose a [facility] primary care physician or advanced practice provider, you gain a long-term partner in health. Find a Primary Care Provider  Learn more about [facility]'s in-office and virtual care options: [facility] - Get Care Now

## 2023-08-30 ENCOUNTER — Encounter

## 2023-08-30 ENCOUNTER — Other Ambulatory Visit: Payer: Self-pay

## 2023-08-30 DIAGNOSIS — E119 Type 2 diabetes mellitus without complications: Secondary | ICD-10-CM

## 2023-09-03 ENCOUNTER — Other Ambulatory Visit: Payer: Self-pay | Admitting: Internal Medicine

## 2023-09-04 ENCOUNTER — Encounter (HOSPITAL_COMMUNITY): Admitting: Cardiology

## 2023-09-08 ENCOUNTER — Ambulatory Visit: Admitting: Dermatology

## 2023-09-09 DIAGNOSIS — R531 Weakness: Secondary | ICD-10-CM | POA: Diagnosis not present

## 2023-09-09 DIAGNOSIS — G992 Myelopathy in diseases classified elsewhere: Secondary | ICD-10-CM | POA: Diagnosis not present

## 2023-09-09 DIAGNOSIS — G8254 Quadriplegia, C5-C7 incomplete: Secondary | ICD-10-CM | POA: Diagnosis not present

## 2023-09-10 ENCOUNTER — Other Ambulatory Visit: Payer: Self-pay

## 2023-09-10 MED ORDER — POTASSIUM CHLORIDE CRYS ER 20 MEQ PO TBCR: 20.0000 meq | EXTENDED_RELEASE_TABLET | Freq: Two times a day (BID) | ORAL | 3 refills | Status: DC

## 2023-09-13 ENCOUNTER — Other Ambulatory Visit: Payer: Self-pay

## 2023-09-13 DIAGNOSIS — J4530 Mild persistent asthma, uncomplicated: Secondary | ICD-10-CM

## 2023-09-13 DIAGNOSIS — J069 Acute upper respiratory infection, unspecified: Secondary | ICD-10-CM

## 2023-09-14 ENCOUNTER — Other Ambulatory Visit: Payer: Self-pay | Admitting: Primary Care

## 2023-09-14 DIAGNOSIS — J069 Acute upper respiratory infection, unspecified: Secondary | ICD-10-CM

## 2023-09-14 NOTE — Telephone Encounter (Unsigned)
 Copied from CRM [id number]. Topic: Clinical - Medication Refill >> Sep 14, 2023  4:49 PM Rilla B wrote: Medication:  albuterol  (PROVENTIL ) (2.5 MG/3ML) 0.083% nebulizer solution   Has the patient contacted their pharmacy? Yes (Agent: If no, request that the patient contact the pharmacy for the refill. If patient does not wish to contact the pharmacy document the reason why and proceed with request.) (Agent: If yes, when and what did the pharmacy advise?)  This is the patient's preferred pharmacy:  [facility] [address], [place] - [address] [place] [address] Phone: [phone] Fax: [phone]  Is this the correct pharmacy for this prescription? Yes If no, delete pharmacy and type the correct one.   Has the prescription been filled recently? No  Is the patient out of the medication? Yes  Has the patient been seen for an appointment in the last year OR does the patient have an upcoming appointment? Yes  Can we respond through MyChart? Yes  Agent: Please be advised that Rx refills may take up to 3 business days. We ask that you follow-up with your pharmacy.

## 2023-09-15 MED ORDER — ALBUTEROL SULFATE (2.5 MG/3ML) 0.083% IN NEBU
2.5000 mg | INHALATION_SOLUTION | Freq: Four times a day (QID) | RESPIRATORY_TRACT | 1 refills | Status: AC | PRN
Start: 2023-09-15 — End: ?

## 2023-09-15 NOTE — Telephone Encounter (Signed)
 Pt wants a refill on albuterol  (PROVENTIL ) (2.5 MG/3ML) 0.083% nebulizer solution Provider for this rx is not at our practice, would you like me to deny this or refill under your name?

## 2023-09-22 ENCOUNTER — Encounter: Payer: Self-pay | Admitting: Podiatry

## 2023-09-22 ENCOUNTER — Ambulatory Visit (INDEPENDENT_AMBULATORY_CARE_PROVIDER_SITE_OTHER): Admitting: Podiatry

## 2023-09-22 DIAGNOSIS — M79674 Pain in right toe(s): Secondary | ICD-10-CM | POA: Diagnosis not present

## 2023-09-22 DIAGNOSIS — B351 Tinea unguium: Secondary | ICD-10-CM | POA: Diagnosis not present

## 2023-09-22 DIAGNOSIS — E1142 Type 2 diabetes mellitus with diabetic polyneuropathy: Secondary | ICD-10-CM | POA: Diagnosis not present

## 2023-09-22 DIAGNOSIS — M79675 Pain in left toe(s): Secondary | ICD-10-CM | POA: Diagnosis not present

## 2023-09-23 NOTE — Progress Notes (Signed)
 He presents today chief complaint of painful elongated toenails.  Objective: Vital signs are stable alert and oriented x 3.  Toenails are long thick yellow dystrophic clinically mycotic  Assessment: Onychomycosis.  Plan: Debridement of toenails 1 through 5 bilateral.

## 2023-09-24 ENCOUNTER — Telehealth: Payer: Self-pay

## 2023-09-24 NOTE — Telephone Encounter (Signed)
 Cholesterol has not been checked in almost a year. Can discuss at OV next month

## 2023-09-24 NOTE — Telephone Encounter (Signed)
 Copied from CRM [id number]. Topic: Clinical - Medication Question >> Sep 24, 2023 11:52 AM Delon DASEN wrote: Reason for CRM: Corean with [facility] calling regarding patient not being on a statin medication, she states patient is open to taking a statin for his diabetes- [phone] xt [id number] secure line

## 2023-10-08 ENCOUNTER — Telehealth (HOSPITAL_COMMUNITY): Payer: Self-pay | Admitting: Cardiology

## 2023-10-08 NOTE — Telephone Encounter (Signed)
 Called to confirm/remind patient of their appointment at the [facility] on 10/08/2023.   Appointment:   [] Confirmed  [x] Left mess   [] No answer/No voice mail  [] VM Full/unable to leave message  [] Phone not in service  Patient reminded to bring all medications and/or complete list.  Confirmed patient has transportation. Gave directions, instructed to utilize valet parking.

## 2023-10-09 ENCOUNTER — Ambulatory Visit (HOSPITAL_COMMUNITY): Payer: Self-pay | Admitting: Family Medicine

## 2023-10-09 ENCOUNTER — Encounter (HOSPITAL_COMMUNITY): Payer: Self-pay

## 2023-10-09 ENCOUNTER — Telehealth (HOSPITAL_COMMUNITY): Payer: Self-pay

## 2023-10-09 ENCOUNTER — Ambulatory Visit (HOSPITAL_COMMUNITY)
Admission: RE | Admit: 2023-10-09 | Discharge: 2023-10-09 | Disposition: A | Discharge status: Home / Self Care | Source: Ambulatory Visit | Attending: Adult Health | Admitting: Adult Health

## 2023-10-09 ENCOUNTER — Other Ambulatory Visit (HOSPITAL_COMMUNITY): Payer: Self-pay

## 2023-10-09 ENCOUNTER — Encounter (HOSPITAL_COMMUNITY): Payer: Self-pay | Admitting: Cardiology

## 2023-10-09 VITALS — BP 103/62 | HR 65 | Ht 71.0 in

## 2023-10-09 DIAGNOSIS — Z7951 Long term (current) use of inhaled steroids: Secondary | ICD-10-CM | POA: Insufficient documentation

## 2023-10-09 DIAGNOSIS — I493 Ventricular premature depolarization: Secondary | ICD-10-CM | POA: Diagnosis not present

## 2023-10-09 DIAGNOSIS — N390 Urinary tract infection, site not specified: Secondary | ICD-10-CM | POA: Insufficient documentation

## 2023-10-09 DIAGNOSIS — E662 Morbid (severe) obesity with alveolar hypoventilation: Secondary | ICD-10-CM | POA: Diagnosis not present

## 2023-10-09 DIAGNOSIS — M7989 Other specified soft tissue disorders: Secondary | ICD-10-CM | POA: Diagnosis not present

## 2023-10-09 DIAGNOSIS — G8220 Paraplegia, unspecified: Secondary | ICD-10-CM | POA: Insufficient documentation

## 2023-10-09 DIAGNOSIS — G4733 Obstructive sleep apnea (adult) (pediatric): Secondary | ICD-10-CM

## 2023-10-09 DIAGNOSIS — Z79899 Other long term (current) drug therapy: Secondary | ICD-10-CM | POA: Insufficient documentation

## 2023-10-09 DIAGNOSIS — T1490XA Injury, unspecified, initial encounter: Secondary | ICD-10-CM | POA: Insufficient documentation

## 2023-10-09 DIAGNOSIS — I5022 Chronic systolic (congestive) heart failure: Secondary | ICD-10-CM | POA: Insufficient documentation

## 2023-10-09 DIAGNOSIS — I82409 Acute embolism and thrombosis of unspecified deep veins of unspecified lower extremity: Secondary | ICD-10-CM | POA: Diagnosis not present

## 2023-10-09 DIAGNOSIS — E8770 Fluid overload, unspecified: Secondary | ICD-10-CM | POA: Diagnosis not present

## 2023-10-09 DIAGNOSIS — Z7901 Long term (current) use of anticoagulants: Secondary | ICD-10-CM | POA: Insufficient documentation

## 2023-10-09 DIAGNOSIS — Z6841 Body Mass Index (BMI) 40.0 and over, adult: Secondary | ICD-10-CM | POA: Insufficient documentation

## 2023-10-09 DIAGNOSIS — I890 Lymphedema, not elsewhere classified: Secondary | ICD-10-CM | POA: Diagnosis not present

## 2023-10-09 DIAGNOSIS — J9691 Respiratory failure, unspecified with hypoxia: Secondary | ICD-10-CM | POA: Diagnosis not present

## 2023-10-09 DIAGNOSIS — Z86718 Personal history of other venous thrombosis and embolism: Secondary | ICD-10-CM

## 2023-10-09 DIAGNOSIS — I2510 Atherosclerotic heart disease of native coronary artery without angina pectoris: Secondary | ICD-10-CM | POA: Diagnosis not present

## 2023-10-09 LAB — COMPREHENSIVE METABOLIC PANEL WITH GFR
ALT: 21 U/L (ref 0–44)
AST: 19 U/L (ref 15–41)
Albumin: 3.4 g/dL — ABNORMAL LOW (ref 3.5–5.0)
Alkaline Phosphatase: 46 U/L (ref 38–126)
Anion gap: 10 (ref 5–15)
BUN: 9 mg/dL (ref 6–20)
CO2: 29 mmol/L (ref 22–32)
Calcium: 8.8 mg/dL — ABNORMAL LOW (ref 8.9–10.3)
Chloride: 98 mmol/L (ref 98–111)
Creatinine, Ser: 1.16 mg/dL (ref 0.61–1.24)
GFR, Estimated: >60 mL/min (ref >60)
Glucose, Bld: 89 mg/dL (ref 70–99)
Potassium: 4 mmol/L (ref 3.5–5.1)
Sodium: 137 mmol/L (ref 135–145)
Total Bilirubin: 1 mg/dL (ref 0.0–1.2)
Total Protein: 7.4 g/dL (ref 6.5–8.1)

## 2023-10-09 LAB — BRAIN NATRIURETIC PEPTIDE: B Natriuretic Peptide: 149.6 pg/mL — ABNORMAL HIGH (ref 0.0–100.0)

## 2023-10-09 LAB — TSH: TSH: 3.396 u[IU]/mL (ref 0.350–4.500)

## 2023-10-09 MED ORDER — FUROSCIX 80 MG/10ML ~~LOC~~ CTKT: 80.0000 mg | CARTRIDGE | Freq: Every day | SUBCUTANEOUS | 0 refills | Status: DC | PRN

## 2023-10-09 NOTE — Addendum Note (Signed)
 Encounter addended by: Marshal Schrecengost B, RN on: 10/09/2023 1:51 PM  Actions taken: Order list changed

## 2023-10-09 NOTE — Telephone Encounter (Signed)
 Advanced Heart Failure Patient Advocate Encounter  Prior authorization for Furoscix  has been submitted and approved. Test billing returns $0 for 30 day supply.  KeyBETHA GALEN Effective: 10/09/2023 to 01/05/2025  Rachel DEL, CPhT Rx Patient Advocate Phone: [phone]

## 2023-10-09 NOTE — Progress Notes (Signed)
 Specialty Pharmacy Initial Fill Coordination Note  Jordan Jensen is a 53 y.o. male contacted today regarding initial fill of specialty medication(s) Furosemide  (Furoscix )   Patient requested Delivery   Delivery date: 10/12/23   Verified address: [address], [place] [address]   Medication will be filled on 10/11/2023.   Patient is aware of $0 copayment.   Disenrolling as this is a one time fill.

## 2023-10-09 NOTE — Patient Instructions (Signed)
 Medication Changes:  Your provider has order Furoscix  for you. This is an on-body infuser that gives you a dose of Furosemide .   It will be shipped to your home from [facility], they will call you before shipping  Ensure you write down the time you start your infusion so that if there is a problem you will know how long the infusion lasted  Use Furoscix  only AS DIRECTED by our office  Dosing Directions: DO NOT TAKE TORSEMIDE  WHILE USING FUROSCIX    Day 1= TAKE 1 DOSE OF FUROSCIX  WITH (2) TABLETS OF POTASSIUM   Day 2= TAKE 1 DOSE OF FUROSCIX  WITH (2) TABLETS OF POTASSIUM   Day 3= TAKE 1 DOSE OF FUROSCIX  WITH (2) TABLETS OF POTASSIUM  DAY 4= RESTART TORSEMIDE  AT 40MG  TWICE DAILY WITH 20MEQ OF POTASSIUM TWICE DAILY   Lab Work:  Labs done today, your results will be available in MyChart, we will contact you for abnormal readings.  Special Instructions // Education:  NONIE BOOTS/WRAP---WE WILL CONTACT HOME HEALTH REGARDING THIS- THEY WILL REACH OUT TO YOU REGARDING THIS   Your physician has requested that you have an ankle brachial index (ABI). During this test an ultrasound and blood pressure cuff are used to evaluate the arteries that supply the arms and legs with blood. Allow thirty minutes for this exam. There are no restrictions or special instructions.  Please note: We ask at that you not bring children with you during ultrasound (echo/ vascular) testing. Due to room size and safety concerns, children are not allowed in the ultrasound rooms during exams. Our front office staff cannot provide observation of children in our lobby area while testing is being conducted. An adult accompanying a patient to their appointment will only be allowed in the ultrasound room at the discretion of the ultrasound technician under special circumstances. We apologize for any inconvenience.  Follow-Up in: NEXT WEEK AS SCHEDULED WITH APP CLINIC   At the [facility], you and your health needs are our priority. We have a designated team specialized in the treatment of Heart Failure. This Care Team includes your primary Heart Failure Specialized Cardiologist (physician), Advanced Practice Providers (APPs- Physician Assistants and Nurse Practitioners), and Pharmacist who all work together to provide you with the care you need, when you need it.   You may see any of the following providers on your designated Care Team at your next follow up:  Dr. Toribio Fuel Dr. Ezra Shuck Dr. Ria Commander Dr. Odis Brownie Greig Mosses, NP Caffie Shed, [place] [facility] [place], [place] Beckey Coe, NP [place] Lee, NP Tinnie Redman, PharmD   Please be sure to bring in all your medications bottles to every appointment.   Need to Contact Us :  If you have any questions or concerns before your next appointment please send us  a message through [facility] or call our office at [phone].    TO LEAVE A MESSAGE FOR THE NURSE SELECT OPTION 2, PLEASE LEAVE A MESSAGE INCLUDING: YOUR NAME DATE OF BIRTH CALL BACK NUMBER REASON FOR CALL**this is important as we prioritize the call backs  YOU WILL RECEIVE A CALL BACK THE SAME DAY AS LONG AS YOU CALL BEFORE 4:00 PM
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
## 2023-10-14 ENCOUNTER — Encounter (HOSPITAL_COMMUNITY)

## 2023-10-14 NOTE — Addendum Note (Signed)
 Encounter addended by: Glena Harlene HERO, FNP on: 10/14/2023 4:33 PM  Actions taken: Clinical Note Signed

## 2023-10-15 ENCOUNTER — Telehealth (HOSPITAL_COMMUNITY): Payer: Self-pay | Admitting: Cardiology

## 2023-10-15 ENCOUNTER — Telehealth (HOSPITAL_COMMUNITY): Payer: Self-pay

## 2023-10-15 ENCOUNTER — Other Ambulatory Visit (HOSPITAL_COMMUNITY): Payer: Self-pay

## 2023-10-15 NOTE — Telephone Encounter (Signed)
 Returned call to patient Patient unable to keep follow up10/10, transportation unable to bring requires 3 days notice.  Rescheduled patient for next available  Pt reports good response from last OV med changes Furoscix  x 3 Reports swelling has improved Denies CP,SOB,Palps Reports increase in UOP  Advised to continue current plan and keep follow up 10/22, will forward to provider if changes are need

## 2023-10-15 NOTE — Telephone Encounter (Signed)
 Called to confirm/remind patient of their appointment at the [facility] on 10/16/23. However, patient rescheduled.

## 2023-10-16 ENCOUNTER — Encounter (HOSPITAL_COMMUNITY)

## 2023-10-16 ENCOUNTER — Ambulatory Visit: Admitting: Physician Assistant

## 2023-10-16 NOTE — Telephone Encounter (Signed)
 No other changes. He should call if worsening fluid retention. Can use additional furoscix  if needed prior to follow-up.

## 2023-10-16 NOTE — Telephone Encounter (Signed)
 Left VM for pt, keep appt as sch 10/22, if retaining fluid before then to please call us 

## 2023-10-17 ENCOUNTER — Other Ambulatory Visit: Payer: Self-pay | Admitting: Internal Medicine

## 2023-10-17 ENCOUNTER — Other Ambulatory Visit: Payer: Self-pay

## 2023-10-17 DIAGNOSIS — J4530 Mild persistent asthma, uncomplicated: Secondary | ICD-10-CM

## 2023-10-23 ENCOUNTER — Ambulatory Visit (HOSPITAL_COMMUNITY)
Admission: RE | Admit: 2023-10-23 | Discharge: 2023-10-23 | Disposition: A | Discharge status: Home / Self Care | Source: Ambulatory Visit | Attending: Family Medicine | Admitting: Family Medicine

## 2023-10-23 DIAGNOSIS — R600 Localized edema: Secondary | ICD-10-CM | POA: Insufficient documentation

## 2023-10-23 DIAGNOSIS — M7989 Other specified soft tissue disorders: Secondary | ICD-10-CM | POA: Diagnosis not present

## 2023-10-23 NOTE — Addendum Note (Signed)
 Encounter addended by: Glena Harlene HERO, FNP on: 10/23/2023 1:50 PM  Actions taken: Clinical Note Signed

## 2023-10-26 LAB — VAS US ABI WITH/WO TBI
Left ABI: 1.32
Right ABI: 1.25

## 2023-10-27 ENCOUNTER — Telehealth (HOSPITAL_COMMUNITY): Payer: Self-pay

## 2023-10-27 NOTE — Telephone Encounter (Signed)
 Called to confirm/remind patient of their appointment at the [facility] on 10/28/23.   Appointment:   [] Confirmed  [x] Left mess   [] No answer/No voice mail  [] VM Full/unable to leave message  [] Phone not in service  And to bring in all medications and/or complete list.

## 2023-10-28 ENCOUNTER — Other Ambulatory Visit (HOSPITAL_COMMUNITY): Payer: Self-pay

## 2023-10-28 ENCOUNTER — Other Ambulatory Visit: Payer: Self-pay

## 2023-10-28 ENCOUNTER — Ambulatory Visit (HOSPITAL_COMMUNITY)
Admission: RE | Admit: 2023-10-28 | Discharge: 2023-10-28 | Disposition: A | Discharge status: Home / Self Care | Source: Ambulatory Visit | Attending: Family Medicine | Admitting: Family Medicine

## 2023-10-28 ENCOUNTER — Encounter (HOSPITAL_COMMUNITY): Payer: Self-pay

## 2023-10-28 VITALS — BP 128/76 | HR 77

## 2023-10-28 DIAGNOSIS — Z7951 Long term (current) use of inhaled steroids: Secondary | ICD-10-CM | POA: Insufficient documentation

## 2023-10-28 DIAGNOSIS — E8770 Fluid overload, unspecified: Secondary | ICD-10-CM | POA: Insufficient documentation

## 2023-10-28 DIAGNOSIS — J9691 Respiratory failure, unspecified with hypoxia: Secondary | ICD-10-CM | POA: Insufficient documentation

## 2023-10-28 DIAGNOSIS — I82409 Acute embolism and thrombosis of unspecified deep veins of unspecified lower extremity: Secondary | ICD-10-CM | POA: Insufficient documentation

## 2023-10-28 DIAGNOSIS — I2510 Atherosclerotic heart disease of native coronary artery without angina pectoris: Secondary | ICD-10-CM | POA: Insufficient documentation

## 2023-10-28 DIAGNOSIS — E662 Morbid (severe) obesity with alveolar hypoventilation: Secondary | ICD-10-CM | POA: Insufficient documentation

## 2023-10-28 DIAGNOSIS — T1490XA Injury, unspecified, initial encounter: Secondary | ICD-10-CM | POA: Insufficient documentation

## 2023-10-28 DIAGNOSIS — I493 Ventricular premature depolarization: Secondary | ICD-10-CM | POA: Insufficient documentation

## 2023-10-28 DIAGNOSIS — G4733 Obstructive sleep apnea (adult) (pediatric): Secondary | ICD-10-CM | POA: Diagnosis not present

## 2023-10-28 DIAGNOSIS — Z993 Dependence on wheelchair: Secondary | ICD-10-CM | POA: Insufficient documentation

## 2023-10-28 DIAGNOSIS — Z86718 Personal history of other venous thrombosis and embolism: Secondary | ICD-10-CM | POA: Diagnosis not present

## 2023-10-28 DIAGNOSIS — I890 Lymphedema, not elsewhere classified: Secondary | ICD-10-CM | POA: Diagnosis not present

## 2023-10-28 DIAGNOSIS — Z79899 Other long term (current) drug therapy: Secondary | ICD-10-CM | POA: Insufficient documentation

## 2023-10-28 DIAGNOSIS — R1900 Intra-abdominal and pelvic swelling, mass and lump, unspecified site: Secondary | ICD-10-CM | POA: Insufficient documentation

## 2023-10-28 DIAGNOSIS — G8220 Paraplegia, unspecified: Secondary | ICD-10-CM | POA: Insufficient documentation

## 2023-10-28 DIAGNOSIS — I5022 Chronic systolic (congestive) heart failure: Secondary | ICD-10-CM | POA: Diagnosis not present

## 2023-10-28 LAB — BASIC METABOLIC PANEL WITH GFR
Anion gap: 10 (ref 5–15)
BUN: 10 mg/dL (ref 6–20)
CO2: 30 mmol/L (ref 22–32)
Calcium: 9.1 mg/dL (ref 8.9–10.3)
Chloride: 101 mmol/L (ref 98–111)
Creatinine, Ser: 1.23 mg/dL (ref 0.61–1.24)
GFR, Estimated: >60 mL/min (ref >60)
Glucose, Bld: 83 mg/dL (ref 70–99)
Potassium: 4.5 mmol/L (ref 3.5–5.1)
Sodium: 141 mmol/L (ref 135–145)

## 2023-10-28 LAB — BRAIN NATRIURETIC PEPTIDE: B Natriuretic Peptide: 181.6 pg/mL — ABNORMAL HIGH (ref 0.0–100.0)

## 2023-10-28 MED ORDER — TORSEMIDE 20 MG PO TABS: 80.0000 mg | ORAL_TABLET | Freq: Two times a day (BID) | ORAL | 0 refills | Status: DC

## 2023-10-28 MED ORDER — POTASSIUM CHLORIDE CRYS ER 20 MEQ PO TBCR: 40.0000 meq | EXTENDED_RELEASE_TABLET | Freq: Two times a day (BID) | ORAL | 3 refills | Status: DC

## 2023-10-28 MED ORDER — FUROSCIX 80 MG/10ML ~~LOC~~ CTKT
80.0000 mg | CARTRIDGE | Freq: Every day | SUBCUTANEOUS | 2 refills | Status: AC | PRN
  Filled 2023-10-28: qty 10, 10d supply, fill #0

## 2023-10-28 NOTE — Addendum Note (Signed)
 Encounter addended by: Fredrica Capano M, RN on: 10/28/2023 3:41 PM  Actions taken: Charge Capture section accepted

## 2023-10-28 NOTE — Progress Notes (Signed)
 Specialty Pharmacy Initial Fill Coordination Note  Jordan Jensen is a 53 y.o. male contacted today regarding initial fill of specialty medication(s) Furosemide  (Furoscix )   Patient requested Delivery   Delivery date: 10/30/23   Verified address: [address], [place] [address]   Medication will be filled on 10/29/2023.   Patient is aware of $0 copayment.   Disenrolling as this is a one time fill.
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
## 2023-10-28 NOTE — Patient Instructions (Addendum)
 Good to see you today!    INCREASE torsemide  to 80 mg Twice daily  INCREASE potassium to 40 meq Twice daily  Labs done today, your results will be available in MyChart, we will contact you for abnormal readings.  Repeat lab work as scheduled  Your physician recommends that you schedule a follow-up appointment as scheduled  If you have any questions or concerns before your next appointment please send us  a message through [employer] or call our office at [phone].    TO LEAVE A MESSAGE FOR THE NURSE SELECT OPTION 2, PLEASE LEAVE A MESSAGE INCLUDING: YOUR NAME DATE OF BIRTH CALL BACK NUMBER REASON FOR CALL**this is important as we prioritize the call backs  YOU WILL RECEIVE A CALL BACK THE SAME DAY AS LONG AS YOU CALL BEFORE 4:00 PM At the [facility], you and your health needs are our priority. As part of our continuing mission to provide you with exceptional heart care, we have created designated Provider Care Teams. These Care Teams include your primary Cardiologist (physician) and Advanced Practice Providers (APPs- Physician Assistants and Nurse Practitioners) who all work together to provide you with the care you need, when you need it.   You may see any of the following providers on your designated Care Team at your next follow up: Dr Toribio Fuel Dr Ezra Shuck Dr. Ria Commander Dr. Morene Brownie Amy Lenetta, NP Caffie Shed, [place] [facility] [place], [place] Beckey Coe, NP [place] Lee, NP Ellouise Class, NP Tinnie Redman, PharmD Jaun Bash, PharmD   Please be sure to bring in all your medications bottles to every appointment.    Thank you for choosing [facility]

## 2023-10-29 ENCOUNTER — Other Ambulatory Visit: Payer: Self-pay

## 2023-10-29 ENCOUNTER — Other Ambulatory Visit (HOSPITAL_COMMUNITY): Payer: Self-pay

## 2023-10-30 ENCOUNTER — Ambulatory Visit (INDEPENDENT_AMBULATORY_CARE_PROVIDER_SITE_OTHER)

## 2023-10-30 ENCOUNTER — Ambulatory Visit (HOSPITAL_COMMUNITY): Payer: Self-pay | Admitting: Family Medicine

## 2023-10-30 VITALS — BP 136/87 | HR 74 | Ht 71.0 in | Wt 349.0 lb

## 2023-10-30 DIAGNOSIS — Z7985 Long-term (current) use of injectable non-insulin antidiabetic drugs: Secondary | ICD-10-CM | POA: Diagnosis not present

## 2023-10-30 DIAGNOSIS — E7849 Other hyperlipidemia: Secondary | ICD-10-CM | POA: Diagnosis not present

## 2023-10-30 DIAGNOSIS — Z6841 Body Mass Index (BMI) 40.0 and over, adult: Secondary | ICD-10-CM

## 2023-10-30 DIAGNOSIS — E119 Type 2 diabetes mellitus without complications: Secondary | ICD-10-CM

## 2023-10-30 DIAGNOSIS — J4530 Mild persistent asthma, uncomplicated: Secondary | ICD-10-CM

## 2023-10-30 DIAGNOSIS — J069 Acute upper respiratory infection, unspecified: Secondary | ICD-10-CM

## 2023-10-30 MED ORDER — MONTELUKAST SODIUM 10 MG PO TABS: 10.0000 mg | ORAL_TABLET | Freq: Every day | ORAL | 3 refills | Status: AC

## 2023-10-30 MED ORDER — SYMBICORT 160-4.5 MCG/ACT IN AERO: 2.0000 | INHALATION_SPRAY | Freq: Two times a day (BID) | RESPIRATORY_TRACT | 11 refills | Status: AC

## 2023-10-30 MED ORDER — PROMETHAZINE-DM 6.25-15 MG/5ML PO SYRP: 5.0000 mL | ORAL_SOLUTION | Freq: Four times a day (QID) | ORAL | 0 refills | Status: DC | PRN

## 2023-10-30 MED ORDER — SEMAGLUTIDE (2 MG/DOSE) 8 MG/3ML ~~LOC~~ SOPN: 2.0000 mg | PEN_INJECTOR | SUBCUTANEOUS | 2 refills | Status: AC

## 2023-10-30 MED ORDER — FLUTICASONE PROPIONATE 50 MCG/ACT NA SUSP: 2.0000 | Freq: Every day | NASAL | 11 refills | Status: AC

## 2023-10-30 MED ORDER — ROSUVASTATIN CALCIUM 20 MG PO TABS: 20.0000 mg | ORAL_TABLET | Freq: Every day | ORAL | 3 refills | Status: AC

## 2023-10-30 MED ORDER — AMOXICILLIN-POT CLAVULANATE 875-125 MG PO TABS: 1.0000 | ORAL_TABLET | Freq: Two times a day (BID) | ORAL | 0 refills | Status: AC
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
## 2023-10-31 DIAGNOSIS — G4733 Obstructive sleep apnea (adult) (pediatric): Secondary | ICD-10-CM | POA: Diagnosis not present

## 2023-11-01 NOTE — Assessment & Plan Note (Signed)
 Current Ozempic  1.5 mg has plateaued. A1c monitoring ongoing. Weight loss needed for back surgery. - Increase Ozempic  to 2 mg. - Perform finger stick A1c test.

## 2023-11-01 NOTE — Assessment & Plan Note (Signed)
-   Stable; continue Symbicort as needed and Singulair 10 mg at bedtime

## 2023-11-01 NOTE — Assessment & Plan Note (Signed)
 Weight loss required for back surgery. Current efforts resulted in 40-pound reduction. - Increase Ozempic  to 2 mg to aid in weight loss.

## 2023-11-03 LAB — BAYER DCA HB A1C WAIVED: HB A1C (BAYER DCA - WAIVED): 5.6 % (ref 4.8–5.6)

## 2023-11-10 ENCOUNTER — Ambulatory Visit: Admitting: Physician Assistant

## 2023-11-13 ENCOUNTER — Ambulatory Visit: Payer: 59 | Admitting: Urology

## 2023-11-16 ENCOUNTER — Ambulatory Visit (HOSPITAL_COMMUNITY)

## 2023-11-18 ENCOUNTER — Ambulatory Visit: Payer: Self-pay

## 2023-11-20 ENCOUNTER — Ambulatory Visit (HOSPITAL_COMMUNITY)

## 2023-11-25 ENCOUNTER — Encounter (HOSPITAL_COMMUNITY): Payer: Self-pay

## 2023-12-09 ENCOUNTER — Telehealth: Admitting: Physician Assistant

## 2023-12-09 ENCOUNTER — Ambulatory Visit: Admitting: Dermatology

## 2023-12-09 ENCOUNTER — Encounter: Payer: Self-pay | Admitting: Dermatology

## 2023-12-09 DIAGNOSIS — D234 Other benign neoplasm of skin of scalp and neck: Secondary | ICD-10-CM

## 2023-12-09 DIAGNOSIS — J208 Acute bronchitis due to other specified organisms: Secondary | ICD-10-CM | POA: Diagnosis not present

## 2023-12-09 DIAGNOSIS — L810 Postinflammatory hyperpigmentation: Secondary | ICD-10-CM | POA: Diagnosis not present

## 2023-12-09 DIAGNOSIS — D485 Neoplasm of uncertain behavior of skin: Secondary | ICD-10-CM

## 2023-12-09 DIAGNOSIS — B9689 Other specified bacterial agents as the cause of diseases classified elsewhere: Secondary | ICD-10-CM | POA: Diagnosis not present

## 2023-12-09 DIAGNOSIS — J4541 Moderate persistent asthma with (acute) exacerbation: Secondary | ICD-10-CM

## 2023-12-09 MED ORDER — DOXYCYCLINE HYCLATE 100 MG PO TABS: 100.0000 mg | ORAL_TABLET | Freq: Two times a day (BID) | ORAL | 0 refills | Status: AC

## 2023-12-09 MED ORDER — PREDNISONE 20 MG PO TABS: 40.0000 mg | ORAL_TABLET | Freq: Every day | ORAL | 0 refills | Status: AC

## 2023-12-09 MED ORDER — BENZONATATE 100 MG PO CAPS: 100.0000 mg | ORAL_CAPSULE | Freq: Three times a day (TID) | ORAL | 0 refills | Status: AC | PRN
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
## 2023-12-09 NOTE — Patient Instructions (Addendum)
 Jordan Jensen, thank you for joining [name], PA-C for today's virtual visit.  While this provider is not your primary care provider (PCP), if your PCP is located in our provider database this encounter information will be shared with them immediately following your visit.   A [facility] MyChart account gives you access to today's visit and all your visits, tests, and labs performed at [facility]  click here if you don't have a [facility] MyChart account or go to [email or web address]  Consent: (Patient) Jordan Jensen provided verbal consent for this virtual visit at the beginning of the encounter.  Current Medications:  Current Outpatient Medications:    acetaminophen  (TYLENOL ) 500 MG tablet, Take 500 mg by mouth every 6 (six) hours as needed for moderate pain., Disp: , Rfl:    albuterol  (PROVENTIL ) (2.5 MG/3ML) 0.083% nebulizer solution, Take 3 mLs (2.5 mg total) by nebulization every 6 (six) hours as needed for wheezing or shortness of breath., Disp: 150 mL, Rfl: 1   alclomethasone (ACLOVATE) 0.05 % cream, Apply topically 2 (two) times daily as needed., Disp: , Rfl:    alfuzosin  (UROXATRAL ) 10 MG 24 hr tablet, Take 1 tablet (10 mg total) by mouth at bedtime., Disp: 30 tablet, Rfl: 11   amiodarone  (PACERONE ) 200 MG tablet, Take 1 tablet (200 mg total) by mouth 2 (two) times daily for 14 days, THEN 1 tablet (200 mg total) daily. (Patient taking differently: Take 1 tablet (200 mg total) by mouth 2 (two) times daily for 14 days, THEN 1 tablet (200 mg total) daily. Patient is taking 1 tablet a day.), Disp: 104 tablet, Rfl: 3   amiodarone  (PACERONE ) 200 MG tablet, Take 200 mg by mouth daily., Disp: , Rfl:    azelastine  (ASTELIN ) 0.1 % nasal spray, Place 1 spray into both nostrils 2 (two) times daily. Use in each nostril as directed, Disp: 30 mL, Rfl: 0   benzonatate  (TESSALON ) 100 MG capsule, Take 1-2 capsules (100-200 mg total) by mouth 3 (three) times daily as  needed., Disp: 30 capsule, Rfl: 0   Elastic Bandages & Supports (MEDICAL COMPRESSION STOCKINGS) MISC, 2 each by Does not apply route daily., Disp: 2 each, Rfl: 0   fluticasone  (FLONASE ) 50 MCG/ACT nasal spray, Place 2 sprays into both nostrils daily., Disp: 16 g, Rfl: 11   Furosemide  (FUROSCIX ) 80 MG/10ML CTKT, Inject 80 mg into the skin daily as needed (use only as directed by the heart failure clinic)., Disp: 10 each, Rfl: 2   ipratropium (ATROVENT ) 0.03 % nasal spray, Place 2 sprays into both nostrils every 12 (twelve) hours., Disp: 30 mL, Rfl: 12   losartan  (COZAAR ) 25 MG tablet, Take 1 tablet by mouth once daily, Disp: 90 tablet, Rfl: 0   metoprolol  succinate (TOPROL  XL) 25 MG 24 hr tablet, Take 0.5 tablets (12.5 mg total) by mouth daily., Disp: 50 tablet, Rfl: 3   montelukast  (SINGULAIR ) 10 MG tablet, Take 1 tablet (10 mg total) by mouth at bedtime., Disp: 90 tablet, Rfl: 3   potassium chloride  SA (KLOR-CON  M) 20 MEQ tablet, Take 2 tablets (40 mEq total) by mouth 2 (two) times daily., Disp: 90 tablet, Rfl: 3   promethazine -dextromethorphan (PROMETHAZINE -DM) 6.25-15 MG/5ML syrup, Take 5 mLs by mouth 4 (four) times daily as needed., Disp: 118 mL, Rfl: 0   rosuvastatin  (CRESTOR ) 20 MG tablet, Take 1 tablet (20 mg total) by mouth daily., Disp: 90 tablet, Rfl: 3   Semaglutide , 2 MG/DOSE, 8 MG/3ML SOPN, Inject 2 mg as  directed once a week., Disp: 6 mL, Rfl: 2   spironolactone  (ALDACTONE ) 25 MG tablet, Take 1 tablet by mouth once daily, Disp: 90 tablet, Rfl: 0   SYMBICORT  160-4.5 MCG/ACT inhaler, Inhale 2 puffs into the lungs in the morning and at bedtime., Disp: 11 g, Rfl: 11   torsemide  (DEMADEX ) 20 MG tablet, Take 4 tablets (80 mg total) by mouth 2 (two) times daily., Disp: 120 tablet, Rfl: 0   Medications ordered in this encounter:  No orders of the defined types were placed in this encounter.    *If you need refills on other medications prior to your next appointment, please contact your  pharmacy*  Follow-Up: Call back or seek an in-person evaluation if the symptoms worsen or if the condition fails to improve as anticipated.  [facility] [phone]  Other Instructions Take antibiotic (Doxycycline ) as directed.  Increase fluids.  Get plenty of rest. Use Mucinex  for congestion. Take the prednisone  and cough medication as directed. Take a daily probiotic (I recommend Align or Culturelle, but even Activia Yogurt may be beneficial).  A humidifier placed in the bedroom may offer some relief for a dry, scratchy throat of nasal irritation.  Read information below on acute bronchitis. Please call or return to clinic if symptoms are not improving.  Acute Bronchitis Bronchitis is when the airways that extend from the windpipe into the lungs get red, puffy, and painful (inflamed). Bronchitis often causes thick spit (mucus) to develop. This leads to a cough. A cough is the most common symptom of bronchitis. In acute bronchitis, the condition usually begins suddenly and goes away over time (usually in 2 weeks). Smoking, allergies, and asthma can make bronchitis worse. Repeated episodes of bronchitis may cause more lung problems.  HOME CARE Rest. Drink enough fluids to keep your pee (urine) clear or pale yellow (unless you need to limit fluids as told by your doctor). Only take over-the-counter or prescription medicines as told by your doctor. Avoid smoking and secondhand smoke. These can make bronchitis worse. If you are a smoker, think about using nicotine gum or skin patches. Quitting smoking will help your lungs heal faster. Reduce the chance of getting bronchitis again by: Washing your hands often. Avoiding people with cold symptoms. Trying not to touch your hands to your mouth, nose, or eyes. Follow up with your doctor as told.  GET HELP IF: Your symptoms do not improve after 1 week of treatment. Symptoms include: Cough. Fever. Coughing up thick spit. Body  aches. Chest congestion. Chills. Shortness of breath. Sore throat.  GET HELP RIGHT AWAY IF:  You have an increased fever. You have chills. You have severe shortness of breath. You have bloody thick spit (sputum). You throw up (vomit) often. You lose too much body fluid (dehydration). You have a severe headache. You faint.  MAKE SURE YOU:  Understand these instructions. Will watch your condition. Will get help right away if you are not doing well or get worse. Document Released: 06/11/2007 Document Revised: 08/25/2012 Document Reviewed: 06/15/2012 [facility] Patient Information 2015 [place], [place]. This information is not intended to replace advice given to you by your health care provider. Make sure you discuss any questions you have with your health care provider.    If you have been instructed to have an in-person evaluation today at a local Urgent Care facility, please use the link below. It will take you to a list of all of our available [facility], including address, phone  number and hours of operation. Please do not delay care.  [facility]  If you or a family member do not have a primary care provider, use the link below to schedule a visit and establish care. When you choose a [facility] primary care physician or advanced practice provider, you gain a long-term partner in health. Find a Primary Care Provider  Learn more about [facility]'s in-office and virtual care options: [facility] Get Care Now
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
## 2023-12-09 NOTE — Progress Notes (Signed)
   New Patient Visit   History of Present Illness Jordan Jensen is a 53 year old male who presents with skin lesions on the scalp and face. He is accompanied by his wife.  He has a lesion on his scalp that has been present since the COVID pandemic, approximately five years ago. This lesion was previously treated with cryotherapy and is currently asymptomatic, with no associated pain.  He describes a 5 mm ulcerated papule on his face, which he associates with the use of his BiPAP mask. The lesion intermittently scabs and peels, and he attributes its cause to friction from the mask, which is sometimes too tight, causing discomfort and leaving marks on his skin.  Additionally, he reports dark spots on his face and the back of his head, which he attributes to irritation from the BiPAP mask. These areas were previously inflamed and now exhibit persistent dark pigmentation. The mask fits around these areas, contributing to the pigmentation.  Patient was last seen by a dermatologist 2 years ago. Liquid nitrogen was used to treat an area on the posterior scalp and the right temple.   The following portions of the chart were reviewed this encounter and updated as appropriate: medications, allergies, medical history  Review of Systems:  No other skin or systemic complaints except as noted in HPI or Assessment and Plan.  Objective  Well appearing patient in no apparent distress; mood and affect are within normal limits.  A focused examination was performed of the following areas: Scalp Face  Relevant exam findings are noted in the Assessment and Plan.   Left Occipital Scalp 5mm ulcerated papule   Assessment & Plan    Post Inflammatory Hyperpigmentation Exam: hyperpigmented macules and/or patches at  right cheek and right temple.   Treatment Plan: Discussed that his bi-pap is likely too tight. Encouraged patient to use guaze around pressure points of his mask.    Post-inflammatory  hyperpimentation (PIH)  is a benign condition that comes from having previous inflammation in the skin and will fade with time over months to sometimes years. Recommend daily sun protection including sunscreen SPF 30+ to sun-exposed areas. - Recommend treating any itchy or red areas on the skin quickly to prevent new areas of PIH. Once rash has cleared, treating with prescription medicines such as hydroquinone may help fade dark spots faster.   NEOPLASM OF UNCERTAIN BEHAVIOR OF SKIN Left Occipital Scalp Skin / nail biopsy Type of biopsy: tangential   Informed consent: discussed and consent obtained   Timeout: patient name, date of birth, surgical site, and procedure verified   Procedure prep:  Patient was prepped and draped in usual sterile fashion Prep type:  Isopropyl alcohol Anesthesia: the lesion was anesthetized in a standard fashion   Anesthetic:  1% lidocaine  w/ epinephrine  1-100,000 buffered w/ 8.4% NaHCO3 Instrument used: DermaBlade   Hemostasis achieved with: aluminum  chloride   Outcome: patient tolerated procedure well   Post-procedure details: sterile dressing applied and wound care instructions given   Dressing type: bandage and petrolatum     Return for pending pathology results.  LILLETTE Rollene Gobble, RN, am acting as scribe for RUFUS CHRISTELLA HOLY, MD .   Documentation: I have reviewed the above documentation for accuracy and completeness, and I agree with the above.  RUFUS CHRISTELLA HOLY, MD

## 2023-12-09 NOTE — Patient Instructions (Signed)

## 2023-12-10 ENCOUNTER — Telehealth (HOSPITAL_COMMUNITY): Payer: Self-pay

## 2023-12-10 NOTE — Telephone Encounter (Signed)
 Called to confirm/remind patient of their appointment at the [facility] on 12/11/23.   Appointment:   [] Confirmed  [x] Left mess   [] No answer/No voice mail  [] VM Full/unable to leave message  [] Phone not in service  Patient reminded to bring in all medications and/or complete list.

## 2023-12-11 ENCOUNTER — Ambulatory Visit (HOSPITAL_COMMUNITY)

## 2023-12-11 LAB — SURGICAL PATHOLOGY

## 2023-12-15 ENCOUNTER — Ambulatory Visit: Payer: Self-pay | Admitting: Dermatology

## 2023-12-15 NOTE — Progress Notes (Signed)
 L/m for pt with bx results and treatment recommendations

## 2023-12-24 ENCOUNTER — Telehealth (HOSPITAL_COMMUNITY): Payer: Self-pay

## 2023-12-24 NOTE — Telephone Encounter (Signed)
 Called to confirm/remind patient of their appointment at the [facility] on 12/25/23 3:00.   Appointment:   [x] Confirmed  [] Left mess   [] No answer/No voice mail  [] VM Full/unable to leave message  [] Phone not in service  Patient reminded to bring all medications and/or complete list.  Confirmed patient has transportation. Gave directions, instructed to utilize valet parking.

## 2023-12-25 ENCOUNTER — Encounter (HOSPITAL_COMMUNITY): Payer: Self-pay

## 2023-12-25 ENCOUNTER — Ambulatory Visit (HOSPITAL_COMMUNITY)
Admission: RE | Admit: 2023-12-25 | Discharge: 2023-12-25 | Disposition: A | Discharge status: Home / Self Care | Source: Ambulatory Visit | Attending: Cardiology | Admitting: Cardiology

## 2023-12-25 VITALS — BP 132/82 | HR 79

## 2023-12-25 DIAGNOSIS — Z7952 Long term (current) use of systemic steroids: Secondary | ICD-10-CM | POA: Insufficient documentation

## 2023-12-25 DIAGNOSIS — G8220 Paraplegia, unspecified: Secondary | ICD-10-CM | POA: Insufficient documentation

## 2023-12-25 DIAGNOSIS — I890 Lymphedema, not elsewhere classified: Secondary | ICD-10-CM | POA: Diagnosis not present

## 2023-12-25 DIAGNOSIS — Z79899 Other long term (current) drug therapy: Secondary | ICD-10-CM | POA: Diagnosis not present

## 2023-12-25 DIAGNOSIS — I5022 Chronic systolic (congestive) heart failure: Secondary | ICD-10-CM | POA: Diagnosis present

## 2023-12-25 DIAGNOSIS — Z993 Dependence on wheelchair: Secondary | ICD-10-CM | POA: Diagnosis not present

## 2023-12-25 DIAGNOSIS — E662 Morbid (severe) obesity with alveolar hypoventilation: Secondary | ICD-10-CM | POA: Insufficient documentation

## 2023-12-25 DIAGNOSIS — Z7951 Long term (current) use of inhaled steroids: Secondary | ICD-10-CM | POA: Insufficient documentation

## 2023-12-25 LAB — BASIC METABOLIC PANEL WITH GFR
Anion gap: 7 (ref 5–15)
BUN: 13 mg/dL (ref 6–20)
CO2: 32 mmol/L (ref 22–32)
Calcium: 9.5 mg/dL (ref 8.9–10.3)
Chloride: 98 mmol/L (ref 98–111)
Creatinine, Ser: 1.06 mg/dL (ref 0.61–1.24)
GFR, Estimated: >60 mL/min
Glucose, Bld: 95 mg/dL (ref 70–99)
Potassium: 5.5 mmol/L — ABNORMAL HIGH (ref 3.5–5.1)
Sodium: 137 mmol/L (ref 135–145)

## 2023-12-25 LAB — PRO BRAIN NATRIURETIC PEPTIDE: Pro Brain Natriuretic Peptide: 560 pg/mL — ABNORMAL HIGH

## 2023-12-25 MED ORDER — POTASSIUM CHLORIDE CRYS ER 20 MEQ PO TBCR: EXTENDED_RELEASE_TABLET | ORAL | 3 refills | Status: DC

## 2023-12-25 MED ORDER — TORSEMIDE 20 MG PO TABS: ORAL_TABLET | ORAL | 1 refills | Status: AC

## 2023-12-25 MED ORDER — METOPROLOL SUCCINATE ER 25 MG PO TB24: 12.5000 mg | ORAL_TABLET | Freq: Every day | ORAL | 3 refills | Status: AC

## 2023-12-25 NOTE — Patient Instructions (Signed)
 Medication Changes:  INCREASE TORSEMIDE  TO 80MG  TWICE DAILY AND OF POTASSIUM TWICE DAILY FOR 3 DAYS   THEN ALTERNATE 80MG  TWICE DAILY TORSEMIDE  WITH 40MG  TWICE DAILY THE NEXT DAY   WHEN TAKING 80MG  OF TORSEMIDE  TWICE DAILY TAKE OF POTASSIUM TWICE DAILY   WHEN TAKING 40MG  OF TORSEMIDE  TWICE DAILY TAKE 20MEG OF POTASSIUM TWICE DAILY   Lab Work:  Labs done today, your results will be available in MyChart, we will contact you for abnormal readings.  Follow-Up in: 4-6 WEEKS WITH DR. ZENAIDA AS SCHEDULED   At the [facility], you and your health needs are our priority. We have a designated team specialized in the treatment of Heart Failure. This Care Team includes your primary Heart Failure Specialized Cardiologist (physician), Advanced Practice Providers (APPs- Physician Assistants and Nurse Practitioners), and Pharmacist who all work together to provide you with the care you need, when you need it.   You may see any of the following providers on your designated Care Team at your next follow up:  Dr. Toribio Fuel Dr. Ezra Shuck Dr. Odis Zenaida Greig Mosses, NP Caffie Shed, [place] [facility], [place] Beckey Coe, NP Jordan Lee, NP Tinnie Redman, PharmD   Please be sure to bring in all your medications bottles to every appointment.   Need to Contact Us :  If you have any questions or concerns before your next appointment please send us  a message through [facility] or call our office at [phone].    TO LEAVE A MESSAGE FOR THE NURSE SELECT OPTION 2, PLEASE LEAVE A MESSAGE INCLUDING: YOUR NAME DATE OF BIRTH CALL BACK NUMBER REASON FOR CALL**this is important as we prioritize the call backs  YOU WILL RECEIVE A CALL BACK THE SAME DAY AS LONG AS YOU CALL BEFORE 4:00 PM
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
## 2023-12-28 ENCOUNTER — Ambulatory Visit (HOSPITAL_COMMUNITY): Payer: Self-pay | Admitting: Cardiology

## 2023-12-29 ENCOUNTER — Ambulatory Visit: Admitting: Podiatry

## 2023-12-29 ENCOUNTER — Encounter: Payer: Self-pay | Admitting: Podiatry

## 2023-12-29 DIAGNOSIS — M79675 Pain in left toe(s): Secondary | ICD-10-CM | POA: Diagnosis not present

## 2023-12-29 DIAGNOSIS — E1142 Type 2 diabetes mellitus with diabetic polyneuropathy: Secondary | ICD-10-CM

## 2023-12-29 DIAGNOSIS — B351 Tinea unguium: Secondary | ICD-10-CM

## 2023-12-29 DIAGNOSIS — M79674 Pain in right toe(s): Secondary | ICD-10-CM | POA: Diagnosis not present

## 2023-12-29 NOTE — Progress Notes (Signed)
 He presents today chief complaint of painful elongated toenails.  Objective: Vital signs are stable alert and oriented x 3.  Toenails are long thick yellow dystrophic clinically mycotic  Assessment: Onychomycosis.  Plan: Debridement of toenails 1 through 5 bilateral.

## 2024-01-11 ENCOUNTER — Telehealth (HOSPITAL_COMMUNITY): Payer: Self-pay | Admitting: Cardiology

## 2024-01-11 DIAGNOSIS — I5022 Chronic systolic (congestive) heart failure: Secondary | ICD-10-CM

## 2024-01-11 MED ORDER — POTASSIUM CHLORIDE CRYS ER 20 MEQ PO TBCR: 20.0000 meq | EXTENDED_RELEASE_TABLET | Freq: Two times a day (BID) | ORAL | 3 refills | Status: AC

## 2024-01-11 NOTE — Telephone Encounter (Signed)
 Patient returned call after receiving letter   Aware of 12/25/23 results Will return for labs 01/22/24

## 2024-01-15 ENCOUNTER — Ambulatory Visit: Admitting: Physician Assistant

## 2024-01-21 NOTE — Progress Notes (Signed)
 Jordan Jensen                                          MRN: [id number]   01/21/2024   The [facility] Quality Team Specialist reviewed this patient medical record for the purposes of chart review for care gap closure. The following were reviewed: chart review for care gap closure-kidney health evaluation for diabetes:eGFR  and uACR.    [facility] Quality Team

## 2024-01-22 ENCOUNTER — Ambulatory Visit (HOSPITAL_COMMUNITY)

## 2024-01-22 ENCOUNTER — Ambulatory Visit (HOSPITAL_COMMUNITY)
Admission: RE | Admit: 2024-01-22 | Discharge: 2024-01-22 | Disposition: A | Discharge status: Home / Self Care | Source: Ambulatory Visit | Attending: Cardiology | Admitting: Cardiology

## 2024-01-22 DIAGNOSIS — I5022 Chronic systolic (congestive) heart failure: Secondary | ICD-10-CM

## 2024-01-29 ENCOUNTER — Ambulatory Visit (HOSPITAL_COMMUNITY)
Admission: RE | Admit: 2024-01-29 | Discharge: 2024-01-29 | Disposition: A | Source: Ambulatory Visit | Attending: Cardiology

## 2024-01-29 DIAGNOSIS — I5022 Chronic systolic (congestive) heart failure: Secondary | ICD-10-CM | POA: Diagnosis present

## 2024-01-29 LAB — BASIC METABOLIC PANEL WITH GFR
Anion gap: 7 (ref 5–15)
BUN: 11 mg/dL (ref 6–20)
CO2: 37 mmol/L — ABNORMAL HIGH (ref 22–32)
Calcium: 9.6 mg/dL (ref 8.9–10.3)
Chloride: 96 mmol/L — ABNORMAL LOW (ref 98–111)
Creatinine, Ser: 1.01 mg/dL (ref 0.61–1.24)
GFR, Estimated: >60 mL/min
Glucose, Bld: 79 mg/dL (ref 70–99)
Potassium: 4.3 mmol/L (ref 3.5–5.1)
Sodium: 139 mmol/L (ref 135–145)

## 2024-02-05 ENCOUNTER — Encounter (HOSPITAL_COMMUNITY): Payer: Self-pay | Admitting: Cardiology

## 2024-02-05 ENCOUNTER — Other Ambulatory Visit (HOSPITAL_COMMUNITY): Payer: Self-pay | Admitting: Cardiology

## 2024-02-05 ENCOUNTER — Ambulatory Visit (HOSPITAL_COMMUNITY)
Admission: RE | Admit: 2024-02-05 | Discharge: 2024-02-05 | Disposition: A | Source: Ambulatory Visit | Attending: Cardiology | Admitting: Cardiology

## 2024-02-05 ENCOUNTER — Ambulatory Visit (HOSPITAL_COMMUNITY)
Admission: RE | Admit: 2024-02-05 | Discharge: 2024-02-05 | Disposition: A | Source: Ambulatory Visit | Attending: Cardiology

## 2024-02-05 VITALS — BP 125/80 | Ht 71.0 in | Wt 349.0 lb

## 2024-02-05 DIAGNOSIS — E785 Hyperlipidemia, unspecified: Secondary | ICD-10-CM | POA: Insufficient documentation

## 2024-02-05 DIAGNOSIS — Z6841 Body Mass Index (BMI) 40.0 and over, adult: Secondary | ICD-10-CM | POA: Insufficient documentation

## 2024-02-05 DIAGNOSIS — G4733 Obstructive sleep apnea (adult) (pediatric): Secondary | ICD-10-CM | POA: Insufficient documentation

## 2024-02-05 DIAGNOSIS — Z79899 Other long term (current) drug therapy: Secondary | ICD-10-CM | POA: Insufficient documentation

## 2024-02-05 DIAGNOSIS — I428 Other cardiomyopathies: Secondary | ICD-10-CM | POA: Diagnosis not present

## 2024-02-05 DIAGNOSIS — I5022 Chronic systolic (congestive) heart failure: Secondary | ICD-10-CM | POA: Insufficient documentation

## 2024-02-05 DIAGNOSIS — Z86718 Personal history of other venous thrombosis and embolism: Secondary | ICD-10-CM | POA: Diagnosis not present

## 2024-02-05 DIAGNOSIS — S14156S Other incomplete lesion at C6 level of cervical spinal cord, sequela: Secondary | ICD-10-CM | POA: Insufficient documentation

## 2024-02-05 DIAGNOSIS — I493 Ventricular premature depolarization: Secondary | ICD-10-CM | POA: Insufficient documentation

## 2024-02-05 DIAGNOSIS — I110 Hypertensive heart disease with heart failure: Secondary | ICD-10-CM | POA: Insufficient documentation

## 2024-02-05 DIAGNOSIS — I890 Lymphedema, not elsewhere classified: Secondary | ICD-10-CM | POA: Insufficient documentation

## 2024-02-05 DIAGNOSIS — G8220 Paraplegia, unspecified: Secondary | ICD-10-CM | POA: Insufficient documentation

## 2024-02-05 MED ORDER — SPIRONOLACTONE 25 MG PO TABS: 25.0000 mg | ORAL_TABLET | Freq: Every day | ORAL | 3 refills | Status: AC

## 2024-02-05 MED ORDER — LOSARTAN POTASSIUM 25 MG PO TABS: 25.0000 mg | ORAL_TABLET | Freq: Every day | ORAL | 3 refills | Status: AC

## 2024-02-05 NOTE — Progress Notes (Signed)
 Zio patch placed onto patient.  All instructions and information reviewed with patient, they verbalize understanding with no questions.
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
## 2024-02-05 NOTE — Patient Instructions (Addendum)
 No Labs done today.   No medication changes were made. Please continue all current medications as prescribed.  Your physician recommends that you schedule a follow-up appointment in: 3 months with Dr. Zenaida  Your provider has recommended that  you wear a Zio Patch for 14 days.  This monitor will record your heart rhythm for our review.  IF you have any symptoms while wearing the monitor please press the button.  If you have any issues with the patch or you notice a red or orange light on it please call the company at [phone].  Once you remove the patch please mail it back to the company as soon as possible so we can get the results.  If you have any questions or concerns before your next appointment please send us  a message through Erma or call our office at [phone].    TO LEAVE A MESSAGE FOR THE NURSE SELECT OPTION 2, PLEASE LEAVE A MESSAGE INCLUDING: YOUR NAME DATE OF BIRTH CALL BACK NUMBER REASON FOR CALL**this is important as we prioritize the call backs  YOU WILL RECEIVE A CALL BACK THE SAME DAY AS LONG AS YOU CALL BEFORE 4:00 PM   Do the following things EVERYDAY: Weigh yourself in the morning before breakfast. Write it down and keep it in a log. Take your medicines as prescribed Eat low salt foods--Limit salt (sodium) to 2000 mg per day.  Stay as active as you can everyday Limit all fluids for the day to less than 2 liters   At the [facility], you and your health needs are our priority. As part of our continuing mission to provide you with exceptional heart care, we have created designated Provider Care Teams. These Care Teams include your primary Cardiologist (physician) and Advanced Practice Providers (APPs- Physician Assistants and Nurse Practitioners) who all work together to provide you with the care you need, when you need it.   You may see any of the following providers on your designated Care Team at your next follow up: Dr Toribio Fuel Dr Ezra Shuck Dr. Morene Jordan Jensen Greig Mosses, NP Caffie Shed, [place] [facility] [place], [place] Beckey Coe, NP Jordan Lee, NP Ellouise Class, NP Tinnie Redman, PharmD Jaun Bash, PharmD   Please be sure to bring in all your medications bottles to every appointment.    Thank you for choosing [facility]

## 2024-02-24 ENCOUNTER — Ambulatory Visit: Payer: 59

## 2024-02-24 ENCOUNTER — Ambulatory Visit

## 2024-02-26 ENCOUNTER — Ambulatory Visit: Admitting: Pulmonary Disease

## 2024-03-04 ENCOUNTER — Ambulatory Visit: Admitting: Urology

## 2024-03-29 ENCOUNTER — Ambulatory Visit: Admitting: Podiatry

## 2024-04-29 ENCOUNTER — Ambulatory Visit

## 2024-05-20 ENCOUNTER — Ambulatory Visit (HOSPITAL_COMMUNITY): Admitting: Cardiology
